# Patient Record
Sex: Male | Born: 1935 | Race: White | Hispanic: No | Marital: Married | Smoking: Former smoker
Health system: Southern US, Community
[De-identification: ages and names within clinical notes are randomized; demographics above are authoritative.]

## PROBLEM LIST (undated history)

## (undated) DIAGNOSIS — J342 Deviated nasal septum: Secondary | ICD-10-CM

## (undated) DIAGNOSIS — H269 Unspecified cataract: Secondary | ICD-10-CM

## (undated) DIAGNOSIS — D509 Iron deficiency anemia, unspecified: Secondary | ICD-10-CM

## (undated) DIAGNOSIS — K760 Fatty (change of) liver, not elsewhere classified: Secondary | ICD-10-CM

## (undated) DIAGNOSIS — E039 Hypothyroidism, unspecified: Secondary | ICD-10-CM

## (undated) DIAGNOSIS — Z9989 Dependence on other enabling machines and devices: Secondary | ICD-10-CM

## (undated) DIAGNOSIS — K603 Anal fistula, unspecified: Secondary | ICD-10-CM

## (undated) DIAGNOSIS — R21 Rash and other nonspecific skin eruption: Secondary | ICD-10-CM

## (undated) DIAGNOSIS — K5520 Angiodysplasia of colon without hemorrhage: Secondary | ICD-10-CM

## (undated) DIAGNOSIS — A498 Other bacterial infections of unspecified site: Secondary | ICD-10-CM

## (undated) DIAGNOSIS — D369 Benign neoplasm, unspecified site: Secondary | ICD-10-CM

## (undated) DIAGNOSIS — K5730 Diverticulosis of large intestine without perforation or abscess without bleeding: Secondary | ICD-10-CM

## (undated) DIAGNOSIS — Z860100 Personal history of colon polyps, unspecified: Secondary | ICD-10-CM

## (undated) DIAGNOSIS — G4733 Obstructive sleep apnea (adult) (pediatric): Secondary | ICD-10-CM

## (undated) DIAGNOSIS — I4891 Unspecified atrial fibrillation: Secondary | ICD-10-CM

## (undated) DIAGNOSIS — K219 Gastro-esophageal reflux disease without esophagitis: Secondary | ICD-10-CM

## (undated) DIAGNOSIS — Q2730 Arteriovenous malformation, site unspecified: Secondary | ICD-10-CM

## (undated) DIAGNOSIS — K2270 Barrett's esophagus without dysplasia: Secondary | ICD-10-CM

## (undated) DIAGNOSIS — N400 Enlarged prostate without lower urinary tract symptoms: Secondary | ICD-10-CM

## (undated) DIAGNOSIS — J302 Other seasonal allergic rhinitis: Secondary | ICD-10-CM

## (undated) DIAGNOSIS — R040 Epistaxis: Secondary | ICD-10-CM

## (undated) DIAGNOSIS — M1990 Unspecified osteoarthritis, unspecified site: Secondary | ICD-10-CM

## (undated) DIAGNOSIS — N281 Cyst of kidney, acquired: Secondary | ICD-10-CM

## (undated) HISTORY — PX: EXPLORATORY LAPAROTOMY: SUR591

## (undated) HISTORY — DX: Angiodysplasia of colon without hemorrhage: K55.20

## (undated) HISTORY — DX: Hypothyroidism, unspecified: E03.9

## (undated) HISTORY — DX: Iron deficiency anemia, unspecified: D50.9

## (undated) HISTORY — PX: ESOPHAGOGASTRODUODENOSCOPY: SHX1529

## (undated) HISTORY — DX: Barrett's esophagus without dysplasia: K22.70

## (undated) HISTORY — DX: Unspecified atrial fibrillation: I48.91

## (undated) HISTORY — DX: Benign neoplasm, unspecified site: D36.9

## (undated) HISTORY — PX: CYSTOSCOPY: SUR368

## (undated) HISTORY — PX: COLONOSCOPY: SHX174

## (undated) HISTORY — DX: Benign prostatic hyperplasia without lower urinary tract symptoms: N40.0

---

## 2000-04-08 ENCOUNTER — Ambulatory Visit (HOSPITAL_COMMUNITY): Admission: RE | Admit: 2000-04-08 | Discharge: 2000-04-08 | Payer: Self-pay | Admitting: *Deleted

## 2000-04-08 ENCOUNTER — Encounter: Payer: Self-pay | Admitting: Internal Medicine

## 2000-04-08 ENCOUNTER — Encounter (INDEPENDENT_AMBULATORY_CARE_PROVIDER_SITE_OTHER): Payer: Self-pay | Admitting: Specialist

## 2001-04-01 ENCOUNTER — Encounter: Payer: Self-pay | Admitting: Urology

## 2001-04-01 ENCOUNTER — Encounter: Admission: RE | Admit: 2001-04-01 | Discharge: 2001-04-01 | Payer: Self-pay | Admitting: Urology

## 2006-04-23 ENCOUNTER — Encounter: Admission: RE | Admit: 2006-04-23 | Discharge: 2006-04-23 | Payer: Self-pay | Admitting: Internal Medicine

## 2006-11-24 ENCOUNTER — Encounter: Payer: Self-pay | Admitting: Internal Medicine

## 2006-12-17 ENCOUNTER — Encounter: Payer: Self-pay | Admitting: Internal Medicine

## 2006-12-17 ENCOUNTER — Encounter (INDEPENDENT_AMBULATORY_CARE_PROVIDER_SITE_OTHER): Payer: Self-pay | Admitting: *Deleted

## 2006-12-17 ENCOUNTER — Ambulatory Visit (HOSPITAL_COMMUNITY): Admission: RE | Admit: 2006-12-17 | Discharge: 2006-12-17 | Payer: Self-pay | Admitting: *Deleted

## 2007-02-01 ENCOUNTER — Encounter: Payer: Self-pay | Admitting: Internal Medicine

## 2008-05-31 ENCOUNTER — Encounter: Admission: RE | Admit: 2008-05-31 | Discharge: 2008-05-31 | Payer: Self-pay | Admitting: Internal Medicine

## 2008-06-27 ENCOUNTER — Encounter: Payer: Self-pay | Admitting: Internal Medicine

## 2008-06-27 ENCOUNTER — Ambulatory Visit (HOSPITAL_COMMUNITY): Admission: RE | Admit: 2008-06-27 | Discharge: 2008-06-27 | Payer: Self-pay | Admitting: *Deleted

## 2008-06-27 ENCOUNTER — Encounter (INDEPENDENT_AMBULATORY_CARE_PROVIDER_SITE_OTHER): Payer: Self-pay | Admitting: *Deleted

## 2008-08-04 ENCOUNTER — Encounter: Payer: Self-pay | Admitting: Internal Medicine

## 2009-02-01 ENCOUNTER — Encounter (INDEPENDENT_AMBULATORY_CARE_PROVIDER_SITE_OTHER): Payer: Self-pay | Admitting: *Deleted

## 2009-02-15 ENCOUNTER — Encounter (INDEPENDENT_AMBULATORY_CARE_PROVIDER_SITE_OTHER): Payer: Self-pay | Admitting: *Deleted

## 2009-02-20 ENCOUNTER — Ambulatory Visit: Payer: Self-pay | Admitting: Internal Medicine

## 2009-03-05 ENCOUNTER — Ambulatory Visit: Payer: Self-pay | Admitting: Internal Medicine

## 2009-04-23 ENCOUNTER — Ambulatory Visit: Payer: Self-pay | Admitting: Internal Medicine

## 2009-04-23 DIAGNOSIS — Z86010 Personal history of colonic polyps: Secondary | ICD-10-CM | POA: Insufficient documentation

## 2009-04-23 DIAGNOSIS — K2270 Barrett's esophagus without dysplasia: Secondary | ICD-10-CM | POA: Insufficient documentation

## 2010-01-08 ENCOUNTER — Encounter
Admission: RE | Admit: 2010-01-08 | Discharge: 2010-01-08 | Payer: Self-pay | Source: Home / Self Care | Attending: Internal Medicine | Admitting: Internal Medicine

## 2010-02-26 NOTE — Op Note (Signed)
Summary: EGD:   NAME:  SQUIRE, WITHEY NO.:  [id number]      MEDICAL RECORD NO.:  [id number]          PATIENT TYPE:  AMB      LOCATION:  ENDO                         FACILITY:  [facility]      PHYSICIAN:  Georgiana Spinner, M.D.    DATE OF BIRTH:  1931/05/12      DATE OF PROCEDURE:  12/17/2006   DATE OF DISCHARGE:                                  OPERATIVE REPORT      PROCEDURE:  Upper endoscopy.      INDICATIONS:  GERD.      ANESTHESIA:  Fentanyl 50 mcg, Versed 4 mg.      PROCEDURE:  With the patient mildly sedated in the left lateral   decubitus position, the Pentax videoscopic endoscope was inserted in the   mouth, passed under direct vision through the esophagus, which appeared   normal on first view.  We then entered into the stomach.  Fundus, body,   antrum appeared normal.  Duodenal bulb showed changes that were   photographed and biopsied.  Second portion of the duodenum appeared   normal.  From this point the endoscope was slowly withdrawn taking   circumferential views of duodenal mucosa until the endoscope had been   pulled back into the stomach, placed in retroflexion to view the stomach   from below.  The endoscope was straightened and withdrawn taking   circumferential views of the remaining gastric and esophageal mucosa,   stopping in the lower esophagus where an area of Barrett's was   photographed and biopsied.  The endoscope was withdrawn.  The patient's   vital signs and pulse oximetry remained stable.  The patient tolerated   the procedure well without apparent complication.      FINDINGS:  Question of Barrett's esophagus, clearly seen remote from the   squamocolumnar junction.      Await biopsy report.  The patient will call me for results and follow up   with me as an outpatient.  Proceed to colonoscopy.                  ______________________________   Georgiana Spinner, M.D.            GMO/MEDQ  D:  12/17/2006  T:  12/17/2006  Job:  [id number]

## 2010-02-26 NOTE — Op Note (Signed)
Summary: EGD: Dr. Virginia Rochester  NAME:  Carl Woods, Carl Woods NO.:  [id number]      MEDICAL RECORD NO.:  [id number]          PATIENT TYPE:  AMB      LOCATION:  ENDO                         FACILITY:  [facility]      PHYSICIAN:  Georgiana Spinner, M.D.    DATE OF BIRTH:  24-May-1931      DATE OF PROCEDURE:  06/27/2008   DATE OF DISCHARGE:                                  OPERATIVE REPORT      PROCEDURE:  Upper endoscopy with biopsy.      INDICATIONS:  GERD with Barrett's esophagus.      ANESTHESIA:  Fentanyl 50 mcg, Versed 4 mg.      PROCEDURE:  With the patient mildly sedated in the left lateral   decubitus position, the Pentax videoscopic endoscope was inserted in the   mouth and passed under direct vision through the esophagus which   appeared normal until we reached the distal esophagus, and there was an   [place] of Barrett's photographed and biopsied.  We entered into the   stomach.  Fundus, body, antrum, duodenal bulb, and second portion of the   duodenum were visualized.  From this point, the endoscope was slowly   withdrawn, taking circumferential views of duodenal mucosa, stopping in   the bulb where some areas of raised mucosa were noted, photographed and   biopsied.  The endoscope was pulled back into stomach and placed in   retroflexion to view the stomach from below.  The endoscope was   straightened and withdrawn, taking circumferential views of the   remaining gastric and esophageal mucosa.  The patient's vital signs and   pulse oximeter remained stable.  The patient tolerated the procedure   well without apparent complications.      FINDINGS:  Changes in the duodenum biopsied and question of island of   Barrett's esophagus biopsied.  Await biopsy report.  The patient will   call me for results and follow-up with me as an outpatient.                  ______________________________   Georgiana Spinner, M.D.            GMO/MEDQ  D:  06/27/2008  T:  06/27/2008  Job:   [id number]

## 2010-02-26 NOTE — Letter (Signed)
Summary: [facility] Instructions  [facility]  [address] [place], [place] [address]   Phone: [phone]  Fax: [phone]       Carl Woods    04/28/1931    MRN: [id number]        Procedure Day /Date: 03/05/09  Monday     Arrival Time:  8:30am     Procedure Time: 9:30am     Location of Procedure:                    _ x_  [facility] (4th Floor)                        PREPARATION FOR COLONOSCOPY WITH MOVIPREP   Starting 5 days prior to your procedure _2/2/11 _ do not eat nuts, seeds, popcorn, corn, beans, peas,  salads, or any raw vegetables.  Do not take any fiber supplements (e.g. Metamucil, Citrucel, and Benefiber).  THE DAY BEFORE YOUR PROCEDURE         DATE:  03/04/09  DAY: Sunday  1.  Drink clear liquids the entire day-NO SOLID FOOD  2.  Do not drink anything colored red or purple.  Avoid juices with pulp.  No orange juice.  3.  Drink at least 64 oz. (8 glasses) of fluid/clear liquids during the day to prevent dehydration and help the prep work efficiently.  CLEAR LIQUIDS INCLUDE: Water Jello Ice Popsicles Tea (sugar ok, no milk/cream) Powdered fruit flavored drinks Coffee (sugar ok, no milk/cream) Gatorade Juice: apple, white grape, white cranberry  Lemonade Clear bullion, consomm, broth Carbonated beverages (any kind) Strained chicken noodle soup Hard Candy                             4.  In the morning, mix first dose of MoviPrep solution:    Empty 1 Pouch A and 1 Pouch B into the disposable container    Add lukewarm drinking water to the top line of the container. Mix to dissolve    Refrigerate (mixed solution should be used within 24 hrs)  5.  Begin drinking the prep at 5:00 p.m. The MoviPrep container is divided by 4 marks.   Every 15 minutes drink the solution down to the next mark (approximately 8 oz) until the full liter is complete.   6.  Follow completed prep with 16 oz of clear liquid of your choice (Nothing red  or purple).  Continue to drink clear liquids until bedtime.  7.  Before going to bed, mix second dose of MoviPrep solution:    Empty 1 Pouch A and 1 Pouch B into the disposable container    Add lukewarm drinking water to the top line of the container. Mix to dissolve    Refrigerate  THE DAY OF YOUR PROCEDURE      DATE:  03/05/09  DAY: Monday  Beginning at 4:30 a.m. (5 hours before procedure):         1. Every 15 minutes, drink the solution down to the next mark (approx 8 oz) until the full liter is complete.  2. Follow completed prep with 16 oz. of clear liquid of your choice.    3. You may drink clear liquids until  7:30am  (2 HOURS BEFORE PROCEDURE).   MEDICATION INSTRUCTIONS  Unless otherwise instructed, you should take regular prescription medications with a small sip of water   as early  as possible the morning of your procedure.         OTHER INSTRUCTIONS  You will need a responsible adult at least 75 years of age to accompany you and drive you home.   This person must remain in the waiting room during your procedure.  Wear loose fitting clothing that is easily removed.  Leave jewelry and other valuables at home.  However, you may wish to bring a book to read or  an iPod/MP3 player to listen to music as you wait for your procedure to start.  Remove all body piercing jewelry and leave at home.  Total time from sign-in until discharge is approximately 2-3 hours.  You should go home directly after your procedure and rest.  You can resume normal activities the  day after your procedure.  The day of your procedure you should not:   Drive   Make legal decisions   Operate machinery   Drink alcohol   Return to work  You will receive specific instructions about eating, activities and medications before you leave.    The above instructions have been reviewed and explained to me by   Wyona Almas RN  February 20, 2009 9:37 AM     I fully understand and  can verbalize these instructions _____________________________ Date _________

## 2010-02-26 NOTE — Procedures (Signed)
Summary: Colonoscopy  Patient: Carl Woods Note: All result statuses are Final unless otherwise noted.  Tests: (1) Colonoscopy (COL)   COL Colonoscopy           DONE     [facility]     [address]     [place], [place]  [address]           COLONOSCOPY PROCEDURE REPORT           PATIENT:  Carl Woods  MR#:  [id number]     BIRTHDATE:  October 20, 1931, 77 yrs. old  GENDER:  male           ENDOSCOPIST:  [name], MD, [facility]     Referred by:  Juline Patch, M.D.           PROCEDURE DATE:  03/05/2009     PROCEDURE:  Colonoscopy 19147     ASA CLASS:  Class II     INDICATIONS:  history of pre-cancerous (adenomatous) colon polyps     prior adenomas (2) ascending colon (size not specified, 0.3 and     0.7 cm in pathology report) 11/08 (Dr. Virginia Rochester), no adenomas 2002     colonoscopy           MEDICATIONS:   Fentanyl 50 mcg IV, Versed 4 mg IV           DESCRIPTION OF PROCEDURE:   After the risks benefits and     alternatives of the procedure were thoroughly explained, informed     consent was obtained.  Digital rectal exam was performed and     revealed no abnormalities and normal prostate.   The LB PCF-H180AL     [id number] endoscope was introduced through the anus and advanced to     the cecum, which was identified by both the appendix and ileocecal     valve, without limitations.  The quality of the prep was     excellent, using MoviPrep.  The instrument was then slowly     withdrawn as the colon was fully examined.     Insertion: 2:53 minutes Withdrawal: 11:07 minutes     <<PROCEDUREIMAGES>>           FINDINGS:  Moderate diverticulosis was found in the sigmoid colon.     This was otherwise a normal examination of the colon.     Retroflexed views in the rectum revealed no abnormalities.    The     scope was then withdrawn from the patient and the procedure     completed.           COMPLICATIONS:  None           ENDOSCOPIC IMPRESSION:     1) Moderate diverticulosis in the  sigmoid colon     2) Otherwise normal examination, excellent prep     3) Prior adenomas (removed) 11/08     RECOMMENDATIONS:     This is my first encounter with Mr. [name]. He also has a history     of GERD and Barrett's esophagus. Since his prior     gastroenterologist has moved, I will ask him to arrange an office     appointment to review his GERd and Barrett's and future endoscopy     plans.     REPEAT EXAM:  In 5 year(s) for routine colonoscopy for polyp     surveillance.  if medically fit (he would be 82)  [name], MD, [facility]           CC:  Juline Patch, MD     The Patient           n.     eSIGNED:   [name] at 03/05/2009 10:24 AM           Charlesetta Ivory, [id number]  Note: An exclamation mark (!) indicates a result that was not dispersed into the flowsheet. Document Creation Date: 03/05/2009 10:24 AM _______________________________________________________________________  (1) Order result status: Final Collection or observation date-time: 03/05/2009 10:12 Requested date-time:  Receipt date-time:  Reported date-time:  Referring Physician:   Ordering Physician: Stan Head [id number]) Specimen Source:  Source: Launa Grill Order Number: [id number] Lab site:   Appended Document: Colonoscopy    Clinical Lists Changes  Observations: Added new observation of COLONNXTDUE: 02/2014 (03/05/2009 10:32)

## 2010-02-26 NOTE — Op Note (Signed)
Summary: Colonoscopy: Dr. Debbra Riding [facility]  Patient:    Carl Woods, Carl Woods                         MRN: [id number] Proc. Date: 04/08/00 Adm. Date:  [id number] Attending:  Sabino Gasser                           Procedure Report  PROCEDURE:  Colonoscopy.  ENDOSCOPIST:  Sabino Gasser, M.D.  ANESTHESIA:  Demerol 40 mg, Versed 5 mg.  DESCRIPTION OF PROCEDURE:  With the patient mildly sedated in the left lateral decubitus position, the Olympus videoscopic colonoscope was inserted colonoscope was inserted into the rectum after a normal rectal exam and passed under direct vision to the cecum identified by ileocecal valve and appendiceal orifice.  I was not sure whether there was some adenomatous tissue in this orifice so I biopsied this area.  Subsequently, the endoscope was slowly withdrawn taking circumferential views of the entire colonic mucosa stopping only in the rectum which appeared normal on direct view and in retroflexed view as well.  The endoscope was straightened and withdrawn.  The patients vital signs and pulse oximeter remained stable.  The patient tolerated the procedure well without apparent complications.  FINDINGS:  Question of polyp in ileocecum, biopsied.  Otherwise unremarkable examination.  PLAN:  I will have the patient call me for results of biopsy and follow up with me as needed as an outpatient. DD:  04/08/00 TD:  04/08/00 Job: [id number]

## 2010-02-26 NOTE — Letter (Signed)
Summary: [facility]   Imported By: Lester Sedona 03/14/2009 09:49:34  _____________________________________________________________________  External Attachment:    Type:   Image     Comment:   External Document

## 2010-02-26 NOTE — Letter (Signed)
Summary: Previsit letter  [facility]  [address] [place], [place] [address]   Phone: [phone]  Fax: [phone]       02/01/2009 MRN: [id number]  Carl Woods [address] [place], [place]  [address]  Dear [name],  Welcome to the Gastroenterology Division at [facility].    You are scheduled to see a nurse for your pre-procedure visit on 02-20-09 at 9:00a.m. on the 3rd floor at [facility], [address]  We ask that you try to arrive at our office 15 minutes prior to your appointment time to allow for check-in.  Your nurse visit will consist of discussing your medical and surgical history, your immediate family medical history, and your medications.    Please bring a complete list of all your medications or, if you prefer, bring the medication bottles and we will list them.  We will need to be aware of both prescribed and over the counter drugs.  We will need to know exact dosage information as well.  If you are on blood thinners (Coumadin, Plavix, Aggrenox, Ticlid, etc.) please call our office today/prior to your appointment, as we need to consult with your physician about holding your medication.   Please be prepared to read and sign documents such as consent forms, a financial agreement, and acknowledgement forms.  If necessary, and with your consent, a friend or relative is welcome to sit-in on the nurse visit with you.  Please bring your insurance card so that we may make a copy of it.  If your insurance requires a referral to see a specialist, please bring your referral form from your primary care physician.  No co-pay is required for this nurse visit.     If you cannot keep your appointment, please call [phone] to cancel or reschedule prior to your appointment date.  This allows [place] the opportunity to schedule an appointment for another patient in need of care.    Thank you for choosing  Gastroenterology for your medical needs.  We  appreciate the opportunity to care for you.  Please visit [place] at our website  to learn more about our practice.                     Sincerely.                                                                                                                   The Gastroenterology Division

## 2010-02-26 NOTE — Op Note (Signed)
Summary: Colonoscopy: Dr. Virginia Rochester  NAME:  Carl Woods, Carl Woods NO.:  [id number]      MEDICAL RECORD NO.:  [id number]          PATIENT TYPE:  AMB      LOCATION:  ENDO                         FACILITY:  [facility]      PHYSICIAN:  Georgiana Spinner, M.D.    DATE OF BIRTH:  02-Oct-1931      DATE OF PROCEDURE:  12/17/2006   DATE OF DISCHARGE:                                  OPERATIVE REPORT      PROCEDURE:  Colonoscopy.      INDICATIONS:  Colon polyps, colon cancer screening.      ANESTHESIA:  Versed 1 mg.      PROCEDURE:  With the patient mildly sedated in the left lateral   decubitus position, a rectal exam was attempted and was unremarkable to   my examination.  Subsequently the Pentax videoscopic colonoscope was   inserted into the rectum and passed under direct vision to the cecum,   identified by ileocecal valve and appendiceal orifice.  At this point   the colonoscope was slowly withdrawn taking circumferential views of   colonic mucosa stopping in the ascending colon, where two polyps were   seen and removed, one using snare cautery technique, the other with hot   biopsy forceps technique, both with a setting of 20/150 blended current.   The endoscope was then withdrawn all the way to the rectum stopping next   in the descending colon, where another polyp was seen.  It too was   photographed and it too was removed using hot biopsy forceps technique   with snare cautery technique with the same setting.  All tissue was   retrieved for pathology.  In the rectum the endoscope was placed in   retroflexion to view the anal canal from above.  The endoscope was   straightened and withdrawn.  The patient's vital signs and pulse   oximetry remained stable.  The patient tolerated the procedure well   without apparent complications.      FINDINGS:  Two polyps of the ascending colon, a polyp of descending   colon, all removed.      Await biopsy report.  The patient will call me for  results and follow up   with me as an outpatient.                  ______________________________   Georgiana Spinner, M.D.            GMO/MEDQ  D:  12/17/2006  T:  12/17/2006  Job:  [id number]

## 2010-02-26 NOTE — Letter (Signed)
Summary: [facility]   Imported By: Lester Haynes 03/14/2009 09:51:21  _____________________________________________________________________  External Attachment:    Type:   Image     Comment:   External Document

## 2010-02-26 NOTE — Miscellaneous (Signed)
Summary: [facility] Previsit/prep  Clinical Lists Changes  Medications: Added new medication of MOVIPREP 100 GM  SOLR (PEG-KCL-NACL-NASULF-NA ASC-C) As per prep instructions. - Signed Rx of MOVIPREP 100 GM  SOLR (PEG-KCL-NACL-NASULF-NA ASC-C) As per prep instructions.;  #1 x 0;  Signed;  Entered by: Wyona Almas RN;  Authorized by: [name] MD, [facility];  Method used: Electronically to [facility] [address] [place], [place], [place]  [address], Ph: [phone], Fax: [phone] Observations: Added new observation of NKA: T (02/20/2009 8:41)    Prescriptions: MOVIPREP 100 GM  SOLR (PEG-KCL-NACL-NASULF-NA ASC-C) As per prep instructions.  #1 x 0   Entered by:   Wyona Almas RN   Authorized by:   [name] MD, [facility]   Signed by:   Wyona Almas RN on 02/20/2009   Method used:   Electronically to        [facility] (retail)       [address]       [place], [place]  [address]       Ph: [phone]       Fax: [phone]   RxID:   [id number]

## 2010-02-26 NOTE — Assessment & Plan Note (Signed)
Summary: f/u from colon--ch.   History of Present Illness Visit Type: Initial Visit Primary GI MD: Stan Head MD [facility] Primary Provider: Juline Patch, MD Chief Complaint: F/U colon History of Present Illness:   77 yo white man with a history of Brrett's esophagus diagnosed and followed by Dr. Virginia Rochester in the past. I met him at a colonoscopy in the past month and he is here to review and discuss his Barrett's esophagus.  He is on omeprazole 20 mg daily. About every 6 months he may hnave some reflux/heartburn. Ususually if he overeats, like he did lat night. No dysphagia, unintentional weight loss or bleeding.         Preventive Screening-Counseling & Management  Alcohol-Tobacco     Smoking Status: quit      Drug Use:  no.      EGD  Procedure date:  06/27/2008  Findings:      ? of Barrett's island = INFLAMMATION, NO METAPLASIA raised duodenal mucosa = GASTRIC METAPLASIA  EGD  Procedure date:  12/17/2006  Findings:      Barrett's esophagus - columnar change in esophagus with intestinal metaplasia Duodenum with gastric metaplasia  Procedures Next Due Date:    EGD: 06/2011   Current Medications (verified): 1)  Vitamin D 50,000iu .... Once Monthly 2)  Omeprazole 20 Mg Cpdr (Omeprazole) .... Once Daily 3)  Fexofenadine Hcl 180 Mg Tabs (Fexofenadine Hcl) .... As Needed 4)  Glucosamine-Chondroitin 500-400 Mg Caps (Glucosamine-Chondroitin) .... Once Daily 5)  Levoxyl 112 Mcg Tabs (Levothyroxine Sodium) .... Once Daily 6)  Fluticasone Propionate 50 Mcg/act Susp (Fluticasone Propionate) .... As Needed 7)  Astelin 137 Mcg/spray Soln (Azelastine Hcl) .... As Needed 8)  Viagra 100 Mg Tabs (Sildenafil Citrate) .... As Needed 9)  Saline Nasal Spray 0.65 % Soln (Saline) .... As Needed 10)  Androgel Pump 1 % Gel (Testosterone) .... As Needed  Allergies (verified): No Known Drug Allergies  Past History:  Past Medical History: Barretts Esophagus/GERD Hypothyroidism Urinary  Tract Infection Colon adenomas (last in 2008)  Past Surgical History: Bladder Repair  Family History: No FH of Colon Cancer: Family History of Heart Disease: Both Parents  Social History: Occupation: Retired Patient is a former smoker.  Alcohol Use - no Daily Caffeine Use Illicit Drug Use - no Smoking Status:  quit Drug Use:  no  Review of Systems       The patient complains of allergy/sinus and arthritis/joint pain.    Vital Signs:  Patient profile:   77 year old male Height:      69 inches Weight:      229.50 pounds BMI:     34.01 Pulse rate:   68 / minute Pulse rhythm:   regular BP sitting:   120 / 68  (left arm) Cuff size:   regular  Vitals Entered By: June McMurray CMA Duncan Dull) (April 23, 2009 11:03 AM)  Physical Exam  General:  obese, NAD   Impression & Recommendations:  Problem # 1:  BARRETTS ESOPHAGUS (ICD-530.85) Assessment New EGD, pathology reports and photos from 2008 and 2010 reviewd. Small island-like area of columnar change above z-line in esophagus, in 2008 there was intestinal metaplasia seen but not in 2010.  I explained timing of administration of PPI (30-60 mins before a meal). We reviewed nature and risks of Barrett's esophagus and a handout was provided. If he has increasing GERD symptoms he should see me sooner than routine EGD in 2012 Otherwise he should follow-up with PCP for PPI, etc until  then.  Patient Instructions: 1)  Take the Prilosec (omeprazole) 20 mg every day 30-60 minutes before  2)  If you develop frequent heartburn (more than a couple of times a month) inform Dr. Leone Payor 3)  Your next routine upper endoscopy to check on the Barrett's esophagus should be in June 2013. 4)  Copy sent to : Juline Patch, MD 5)  The medication list was reviewed and reconciled.  All changed / newly prescribed medications were explained.  A complete medication list was provided to the patient / caregiver.

## 2010-02-26 NOTE — Letter (Signed)
Summary: [facility]   Imported By: Lester Yettem 03/14/2009 09:44:39  _____________________________________________________________________  External Attachment:    Type:   Image     Comment:   External Document

## 2010-06-11 NOTE — Op Note (Signed)
NAME:  Carl Woods, Carl Woods NO.:  [id number]   MEDICAL RECORD NO.:  [id number]          PATIENT TYPE:  AMB   LOCATION:  ENDO                         FACILITY:  [facility]   PHYSICIAN:  Georgiana Spinner, M.D.    DATE OF BIRTH:  [birth year]   DATE OF PROCEDURE:  12/17/2006  DATE OF DISCHARGE:                               OPERATIVE REPORT   PROCEDURE:  Colonoscopy.   INDICATIONS:  Colon polyps, colon cancer screening.   ANESTHESIA:  Versed 1 mg.   PROCEDURE:  With the patient mildly sedated in the left lateral  decubitus position, a rectal exam was attempted and was unremarkable to  my examination.  Subsequently the Pentax videoscopic colonoscope was  inserted into the rectum and passed under direct vision to the cecum,  identified by ileocecal valve and appendiceal orifice.  At this point  the colonoscope was slowly withdrawn taking circumferential views of  colonic mucosa stopping in the ascending colon, where two polyps were  seen and removed, one using snare cautery technique, the other with hot  biopsy forceps technique, both with a setting of 20/150 blended current.  The endoscope was then withdrawn all the way to the rectum stopping next  in the descending colon, where another polyp was seen.  It too was  photographed and it too was removed using hot biopsy forceps technique  with snare cautery technique with the same setting.  All tissue was  retrieved for pathology.  In the rectum the endoscope was placed in  retroflexion to view the anal canal from above.  The endoscope was  straightened and withdrawn.  The patient's vital signs and pulse  oximetry remained stable.  The patient tolerated the procedure well  without apparent complications.   FINDINGS:  Two polyps of the ascending colon, a polyp of descending  colon, all removed.   Await biopsy report.  The patient will call me for results and follow up  with me as an outpatient.     ______________________________  Georgiana Spinner, M.D.     GMO/MEDQ  D:  12/17/2006  T:  12/17/2006  Job:  [id number]

## 2010-06-11 NOTE — Op Note (Signed)
NAME:  Carl Woods, Carl Woods NO.:  [id number]   MEDICAL RECORD NO.:  [id number]          PATIENT TYPE:  AMB   LOCATION:  ENDO                         FACILITY:  [facility]   PHYSICIAN:  Georgiana Spinner, M.D.    DATE OF BIRTH:  [birth year]   DATE OF PROCEDURE:  12/17/2006  DATE OF DISCHARGE:                               OPERATIVE REPORT   PROCEDURE:  Upper endoscopy.   INDICATIONS:  GERD.   ANESTHESIA:  Fentanyl 50 mcg, Versed 4 mg.   PROCEDURE:  With the patient mildly sedated in the left lateral  decubitus position, the Pentax videoscopic endoscope was inserted in the  mouth, passed under direct vision through the esophagus, which appeared  normal on first view.  We then entered into the stomach.  Fundus, body,  antrum appeared normal.  Duodenal bulb showed changes that were  photographed and biopsied.  Second portion of the duodenum appeared  normal.  From this point the endoscope was slowly withdrawn taking  circumferential views of duodenal mucosa until the endoscope had been  pulled back into the stomach, placed in retroflexion to view the stomach  from below.  The endoscope was straightened and withdrawn taking  circumferential views of the remaining gastric and esophageal mucosa,  stopping in the lower esophagus where an area of Barrett's was  photographed and biopsied.  The endoscope was withdrawn.  The patient's  vital signs and pulse oximetry remained stable.  The patient tolerated  the procedure well without apparent complication.   FINDINGS:  Question of Barrett's esophagus, clearly seen remote from the  squamocolumnar junction.   Await biopsy report.  The patient will call me for results and follow up  with me as an outpatient.  Proceed to colonoscopy.           ______________________________  Georgiana Spinner, M.D.     GMO/MEDQ  D:  12/17/2006  T:  12/17/2006  Job:  [id number]

## 2010-06-11 NOTE — Op Note (Signed)
NAME:  Carl Woods, Carl Woods NO.:  [id number]   MEDICAL RECORD NO.:  [id number]          PATIENT TYPE:  AMB   LOCATION:  ENDO                         FACILITY:  [facility]   PHYSICIAN:  Georgiana Spinner, M.D.    DATE OF BIRTH:  Jul 16, 1931   DATE OF PROCEDURE:  06/27/2008  DATE OF DISCHARGE:                               OPERATIVE REPORT   PROCEDURE:  Upper endoscopy with biopsy.   INDICATIONS:  GERD with Barrett's esophagus.   ANESTHESIA:  Fentanyl 50 mcg, Versed 4 mg.   PROCEDURE:  With the patient mildly sedated in the left lateral  decubitus position, the Pentax videoscopic endoscope was inserted in the  mouth and passed under direct vision through the esophagus which  appeared normal until we reached the distal esophagus, and there was an  [place] of Barrett's photographed and biopsied.  We entered into the  stomach.  Fundus, body, antrum, duodenal bulb, and second portion of the  duodenum were visualized.  From this point, the endoscope was slowly  withdrawn, taking circumferential views of duodenal mucosa, stopping in  the bulb where some areas of raised mucosa were noted, photographed and  biopsied.  The endoscope was pulled back into stomach and placed in  retroflexion to view the stomach from below.  The endoscope was  straightened and withdrawn, taking circumferential views of the  remaining gastric and esophageal mucosa.  The patient's vital signs and  pulse oximeter remained stable.  The patient tolerated the procedure  well without apparent complications.   FINDINGS:  Changes in the duodenum biopsied and question of island of  Barrett's esophagus biopsied.  Await biopsy report.  The patient will  call me for results and follow-up with me as an outpatient.           ______________________________  Georgiana Spinner, M.D.     GMO/MEDQ  D:  06/27/2008  T:  06/27/2008  Job:  [id number]

## 2010-06-14 NOTE — Procedures (Signed)
[facility]  Patient:    Carl Woods, Carl Woods                         MRN: [id number] Proc. Date: 04/08/00 Adm. Date:  [id number] Attending:  Sabino Gasser                           Procedure Report  PROCEDURE:  Colonoscopy.  ENDOSCOPIST:  Sabino Gasser, M.D.  ANESTHESIA:  Demerol 40 mg, Versed 5 mg.  DESCRIPTION OF PROCEDURE:  With the patient mildly sedated in the left lateral decubitus position, the Olympus videoscopic colonoscope was inserted colonoscope was inserted into the rectum after a normal rectal exam and passed under direct vision to the cecum identified by ileocecal valve and appendiceal orifice.  I was not sure whether there was some adenomatous tissue in this orifice so I biopsied this area.  Subsequently, the endoscope was slowly withdrawn taking circumferential views of the entire colonic mucosa stopping only in the rectum which appeared normal on direct view and in retroflexed view as well.  The endoscope was straightened and withdrawn.  The patients vital signs and pulse oximeter remained stable.  The patient tolerated the procedure well without apparent complications.  FINDINGS:  Question of polyp in ileocecum, biopsied.  Otherwise unremarkable examination.  PLAN:  I will have the patient call me for results of biopsy and follow up with me as needed as an outpatient. DD:  04/08/00 TD:  04/08/00 Job: [id number]

## 2011-05-26 ENCOUNTER — Encounter: Payer: Self-pay | Admitting: Internal Medicine

## 2012-12-29 ENCOUNTER — Other Ambulatory Visit: Payer: Self-pay | Admitting: Internal Medicine

## 2012-12-29 DIAGNOSIS — R799 Abnormal finding of blood chemistry, unspecified: Secondary | ICD-10-CM

## 2013-01-04 ENCOUNTER — Ambulatory Visit
Admission: RE | Admit: 2013-01-04 | Discharge: 2013-01-04 | Disposition: A | Discharge status: Home / Self Care | Payer: Medicare Other | Source: Ambulatory Visit | Attending: Internal Medicine | Admitting: Internal Medicine

## 2013-01-04 DIAGNOSIS — R799 Abnormal finding of blood chemistry, unspecified: Secondary | ICD-10-CM

## 2013-01-04 DIAGNOSIS — N281 Cyst of kidney, acquired: Secondary | ICD-10-CM

## 2013-01-04 HISTORY — DX: Cyst of kidney, acquired: N28.1

## 2013-09-28 ENCOUNTER — Encounter: Payer: Self-pay | Admitting: Internal Medicine

## 2014-03-06 ENCOUNTER — Encounter: Payer: Self-pay | Admitting: Internal Medicine

## 2015-02-26 DIAGNOSIS — Z0001 Encounter for general adult medical examination with abnormal findings: Secondary | ICD-10-CM | POA: Diagnosis not present

## 2015-02-26 DIAGNOSIS — I480 Paroxysmal atrial fibrillation: Secondary | ICD-10-CM | POA: Diagnosis not present

## 2015-02-26 DIAGNOSIS — G4733 Obstructive sleep apnea (adult) (pediatric): Secondary | ICD-10-CM | POA: Diagnosis not present

## 2015-02-26 DIAGNOSIS — K219 Gastro-esophageal reflux disease without esophagitis: Secondary | ICD-10-CM | POA: Diagnosis not present

## 2015-02-26 DIAGNOSIS — E039 Hypothyroidism, unspecified: Secondary | ICD-10-CM | POA: Diagnosis not present

## 2015-03-05 DIAGNOSIS — N183 Chronic kidney disease, stage 3 (moderate): Secondary | ICD-10-CM | POA: Diagnosis not present

## 2015-03-05 DIAGNOSIS — I480 Paroxysmal atrial fibrillation: Secondary | ICD-10-CM | POA: Diagnosis not present

## 2015-03-05 DIAGNOSIS — R030 Elevated blood-pressure reading, without diagnosis of hypertension: Secondary | ICD-10-CM | POA: Diagnosis not present

## 2015-03-05 DIAGNOSIS — E039 Hypothyroidism, unspecified: Secondary | ICD-10-CM | POA: Diagnosis not present

## 2015-05-23 DIAGNOSIS — H40012 Open angle with borderline findings, low risk, left eye: Secondary | ICD-10-CM | POA: Diagnosis not present

## 2015-05-23 DIAGNOSIS — H40011 Open angle with borderline findings, low risk, right eye: Secondary | ICD-10-CM | POA: Diagnosis not present

## 2015-09-17 DIAGNOSIS — N183 Chronic kidney disease, stage 3 (moderate): Secondary | ICD-10-CM | POA: Diagnosis not present

## 2015-09-17 DIAGNOSIS — E039 Hypothyroidism, unspecified: Secondary | ICD-10-CM | POA: Diagnosis not present

## 2015-09-17 DIAGNOSIS — R030 Elevated blood-pressure reading, without diagnosis of hypertension: Secondary | ICD-10-CM | POA: Diagnosis not present

## 2015-09-24 DIAGNOSIS — E039 Hypothyroidism, unspecified: Secondary | ICD-10-CM | POA: Diagnosis not present

## 2015-09-24 DIAGNOSIS — I10 Essential (primary) hypertension: Secondary | ICD-10-CM | POA: Diagnosis not present

## 2015-09-24 DIAGNOSIS — I480 Paroxysmal atrial fibrillation: Secondary | ICD-10-CM | POA: Diagnosis not present

## 2015-09-24 DIAGNOSIS — N183 Chronic kidney disease, stage 3 (moderate): Secondary | ICD-10-CM | POA: Diagnosis not present

## 2015-10-17 ENCOUNTER — Other Ambulatory Visit: Payer: Self-pay | Admitting: Physician Assistant

## 2015-10-17 DIAGNOSIS — L570 Actinic keratosis: Secondary | ICD-10-CM | POA: Diagnosis not present

## 2015-10-17 DIAGNOSIS — C4492 Squamous cell carcinoma of skin, unspecified: Secondary | ICD-10-CM

## 2015-10-17 DIAGNOSIS — D0439 Carcinoma in situ of skin of other parts of face: Secondary | ICD-10-CM | POA: Diagnosis not present

## 2015-10-17 DIAGNOSIS — C44311 Basal cell carcinoma of skin of nose: Secondary | ICD-10-CM | POA: Diagnosis not present

## 2015-10-17 DIAGNOSIS — C4491 Basal cell carcinoma of skin, unspecified: Secondary | ICD-10-CM

## 2015-10-17 HISTORY — DX: Squamous cell carcinoma of skin, unspecified: C44.92

## 2015-10-17 HISTORY — DX: Basal cell carcinoma of skin, unspecified: C44.91

## 2015-11-08 DIAGNOSIS — C44311 Basal cell carcinoma of skin of nose: Secondary | ICD-10-CM | POA: Diagnosis not present

## 2015-11-08 DIAGNOSIS — D0439 Carcinoma in situ of skin of other parts of face: Secondary | ICD-10-CM | POA: Diagnosis not present

## 2015-12-19 DIAGNOSIS — N183 Chronic kidney disease, stage 3 (moderate): Secondary | ICD-10-CM | POA: Diagnosis not present

## 2015-12-19 DIAGNOSIS — I10 Essential (primary) hypertension: Secondary | ICD-10-CM | POA: Diagnosis not present

## 2015-12-25 DIAGNOSIS — Z131 Encounter for screening for diabetes mellitus: Secondary | ICD-10-CM | POA: Diagnosis not present

## 2015-12-27 DIAGNOSIS — I10 Essential (primary) hypertension: Secondary | ICD-10-CM | POA: Diagnosis not present

## 2015-12-27 DIAGNOSIS — E039 Hypothyroidism, unspecified: Secondary | ICD-10-CM | POA: Diagnosis not present

## 2015-12-27 DIAGNOSIS — Z23 Encounter for immunization: Secondary | ICD-10-CM | POA: Diagnosis not present

## 2015-12-27 DIAGNOSIS — I480 Paroxysmal atrial fibrillation: Secondary | ICD-10-CM | POA: Diagnosis not present

## 2015-12-27 DIAGNOSIS — N183 Chronic kidney disease, stage 3 (moderate): Secondary | ICD-10-CM | POA: Diagnosis not present

## 2015-12-31 DIAGNOSIS — H2513 Age-related nuclear cataract, bilateral: Secondary | ICD-10-CM | POA: Diagnosis not present

## 2015-12-31 DIAGNOSIS — H40013 Open angle with borderline findings, low risk, bilateral: Secondary | ICD-10-CM | POA: Diagnosis not present

## 2015-12-31 DIAGNOSIS — H524 Presbyopia: Secondary | ICD-10-CM | POA: Diagnosis not present

## 2015-12-31 DIAGNOSIS — H40011 Open angle with borderline findings, low risk, right eye: Secondary | ICD-10-CM | POA: Diagnosis not present

## 2015-12-31 DIAGNOSIS — H40012 Open angle with borderline findings, low risk, left eye: Secondary | ICD-10-CM | POA: Diagnosis not present

## 2016-02-26 DIAGNOSIS — L570 Actinic keratosis: Secondary | ICD-10-CM | POA: Diagnosis not present

## 2016-02-26 DIAGNOSIS — Z85828 Personal history of other malignant neoplasm of skin: Secondary | ICD-10-CM | POA: Diagnosis not present

## 2016-02-26 DIAGNOSIS — D229 Melanocytic nevi, unspecified: Secondary | ICD-10-CM | POA: Diagnosis not present

## 2016-03-13 DIAGNOSIS — E039 Hypothyroidism, unspecified: Secondary | ICD-10-CM | POA: Diagnosis not present

## 2016-03-13 DIAGNOSIS — Z0000 Encounter for general adult medical examination without abnormal findings: Secondary | ICD-10-CM | POA: Diagnosis not present

## 2016-03-13 DIAGNOSIS — N390 Urinary tract infection, site not specified: Secondary | ICD-10-CM | POA: Diagnosis not present

## 2016-03-13 DIAGNOSIS — I480 Paroxysmal atrial fibrillation: Secondary | ICD-10-CM | POA: Diagnosis not present

## 2016-03-13 DIAGNOSIS — N183 Chronic kidney disease, stage 3 (moderate): Secondary | ICD-10-CM | POA: Diagnosis not present

## 2016-03-20 DIAGNOSIS — D509 Iron deficiency anemia, unspecified: Secondary | ICD-10-CM | POA: Diagnosis not present

## 2016-03-20 DIAGNOSIS — E039 Hypothyroidism, unspecified: Secondary | ICD-10-CM | POA: Diagnosis not present

## 2016-03-20 DIAGNOSIS — I480 Paroxysmal atrial fibrillation: Secondary | ICD-10-CM | POA: Diagnosis not present

## 2016-03-20 DIAGNOSIS — N183 Chronic kidney disease, stage 3 (moderate): Secondary | ICD-10-CM | POA: Diagnosis not present

## 2016-04-08 DIAGNOSIS — D509 Iron deficiency anemia, unspecified: Secondary | ICD-10-CM | POA: Diagnosis not present

## 2016-04-08 DIAGNOSIS — J329 Chronic sinusitis, unspecified: Secondary | ICD-10-CM | POA: Diagnosis not present

## 2016-04-15 DIAGNOSIS — D509 Iron deficiency anemia, unspecified: Secondary | ICD-10-CM | POA: Diagnosis not present

## 2016-04-17 ENCOUNTER — Encounter: Payer: Self-pay | Admitting: Internal Medicine

## 2016-04-17 DIAGNOSIS — K922 Gastrointestinal hemorrhage, unspecified: Secondary | ICD-10-CM | POA: Diagnosis not present

## 2016-04-23 ENCOUNTER — Telehealth: Payer: Self-pay | Admitting: *Deleted

## 2016-04-23 ENCOUNTER — Encounter: Payer: Self-pay | Admitting: Gastroenterology

## 2016-04-23 ENCOUNTER — Ambulatory Visit (INDEPENDENT_AMBULATORY_CARE_PROVIDER_SITE_OTHER): Payer: PPO | Admitting: Gastroenterology

## 2016-04-23 ENCOUNTER — Encounter (INDEPENDENT_AMBULATORY_CARE_PROVIDER_SITE_OTHER): Payer: Self-pay

## 2016-04-23 VITALS — BP 118/54 | HR 72 | Ht 69.0 in | Wt 250.2 lb

## 2016-04-23 DIAGNOSIS — R195 Other fecal abnormalities: Secondary | ICD-10-CM | POA: Diagnosis not present

## 2016-04-23 DIAGNOSIS — Z7901 Long term (current) use of anticoagulants: Secondary | ICD-10-CM | POA: Diagnosis not present

## 2016-04-23 DIAGNOSIS — D500 Iron deficiency anemia secondary to blood loss (chronic): Secondary | ICD-10-CM

## 2016-04-23 DIAGNOSIS — Z86010 Personal history of colonic polyps: Secondary | ICD-10-CM | POA: Diagnosis not present

## 2016-04-23 NOTE — Patient Instructions (Signed)
You have been scheduled for an endoscopy and colonoscopy. Please follow the written instructions given to you at your visit today. Please pick up your prep supplies at the pharmacy within the next 1-3 days. If you use inhalers (even only as needed), please bring them with you on the day of your procedure. Your physician has requested that you go to www.startemmi.com and enter the access code given to you at your visit today. This web site gives a general overview about your procedure. However, you should still follow specific instructions given to you by our office regarding your preparation for the procedure.  You will be contacted by our office prior to your procedure for directions on holding your Eliquis.  If you do not hear from our office 1 week prior to your scheduled procedure, please call [phone] to discuss.   

## 2016-04-23 NOTE — Telephone Encounter (Signed)
  04/23/2016   RE: Carl Woods DOB: May 21, 1931 MRN: [id number]   Dear Dr Merrilee Seashore,   We have scheduled the above patient for an endoscopic procedure. Our records show that he is on anticoagulation therapy.   Please advise as to how long the patient may come off his therapy of  Eliquis prior to the procedure, which is scheduled for 05/28/2016.  Please fax back/ or route the completed form to [place] at [phone].   Sincerely,    Tonita Phoenix AAMA

## 2016-04-23 NOTE — Progress Notes (Addendum)
04/23/2016 Carl Woods [id number] [birth year]   HISTORY OF PRESENT ILLNESS:  This is an 83 year old male who is known to Dr. Carlean Purl for colonoscopy in 02/2009 at which time he was found to have moderate diverticulosis in the sigmoid colon with an excellent prep.  Due to history of prior adenomas that were removed in 11/2006 it was recommended that he have another colonoscopy at a 5 year interval if medically fit.  He is here today to request of his PCP, Dr. Ashby Dawes, for evaluation regarding anemia and Hemoccult-positive stools. The patient was found to have a hemoglobin of 9.1 g in February. Repeat hemoglobin earlier this month was 9.0 g. MCV is normal at 96.4. Ferritin is low at 19, serum iron slightly low at 35, iron saturation low at 8%. Vitamin B12 level was normal. He was Hemoccult negative in their office in February during a rectal exam. Then in March he performed 3 Hemoccult cards that were all positive for occult blood. He denies seeing blood in his stools or any black stools. He is on Eliquis for atrial fibrillation that is prescribed by his PCP.  He denies really any other GI complaints. He tells me that he has had 4 severe nosebleeds over the past year. One of those nosebleeds actually occurred about 5-7 days prior to his positive hemoccult stools tests.  He tells me that he was recently placed on iron supplements by his PCP.   Past Medical History:  Diagnosis Date  . Atrial fibrillation ([place])   . Barrett's esophagus   . BPH (benign prostatic hyperplasia)   . Hypothyroidism   . Sleep apnea   . Tubular adenoma    Past Surgical History:  Procedure Laterality Date  . CYSTOSCOPY      reports that he has quit smoking. He has never used smokeless tobacco. He reports that he drinks alcohol. He reports that he does not use drugs. family history includes Heart disease in his mother; Other in his father; Ovarian cancer in his sister. No Known Allergies    Outpatient Encounter  Prescriptions as of 04/23/2016  Medication Sig  . aspirin 81 MG chewable tablet Chew by mouth daily.  . Cholecalciferol (VITAMIN D3) 5000 units CAPS Take by mouth daily.  . Fe Fum-FePoly-Vit C-Vit B3 (INTEGRA) 62.5-62.5-40-3 MG CAPS daily.  . fexofenadine (ALLEGRA ALLERGY) 180 MG tablet Take 180 mg by mouth as needed for allergies or rhinitis.  . fluticasone (FLONASE) 50 MCG/ACT nasal spray Place 2 sprays into both nostrils daily.  . furosemide (LASIX) 20 MG tablet daily.  . Glucosamine-Chondroit-Vit C-Mn (GLUCOSAMINE CHONDR 500 COMPLEX PO) Take by mouth daily.  Marland Kitchen levothyroxine (SYNTHROID, LEVOTHROID) 112 MCG tablet daily.  Marland Kitchen losartan (COZAAR) 25 MG tablet daily.  Marland Kitchen olopatadine (PATANOL) 0.1 % ophthalmic solution 1 drop 2 (two) times daily. I drop affected eye bid  . omeprazole (PRILOSEC) 20 MG capsule daily.   No facility-administered encounter medications on file as of 04/23/2016.      REVIEW OF SYSTEMS  : All other systems reviewed and negative except where noted in the History of Present Illness.   PHYSICAL EXAM: BP (!) 118/54   Pulse 72   Ht 5\' 9"  (1.753 m)   Wt 250 lb 4 oz (113.5 kg)   BMI 36.96 kg/m  General: Well developed white male in no acute distress Head: Normocephalic and atraumatic Eyes:  Sclerae anicteric, conjunctiva pink. Ears: Normal auditory acuity Lungs: Clear throughout to auscultation Heart: Regular rate and  rhythm Abdomen: Soft, non-distended.  Normal bowel sounds.  Non-tender.  Small umbilical hernia noted. Rectal:  Will be done at the time of colonoscopy. Musculoskeletal: Symmetrical with no gross deformities  Skin: No lesions on visible extremities Extremities: No edema  Neurological: Alert oriented x 4, grossly non-focal Psychological:  Alert and cooperative. Normal mood and affect  ASSESSMENT AND PLAN: -IDA and heme positive stool:  Hemoglobin 9 g. Iron studies low.  Hemoccult positive x 3. Continue iron supplements per PCP.  Will schedule EGD and  colonoscopy with Dr. Carlean Purl to rule out GI source of bleeding.  ? If positive hemoccults could have been related to severe nosebleed that occurred 5-7 days prior to FOBT. -Personal history of colon polyps:  Was actually due for colonoscopy in 2016 for previous history of adenomatous colon polyps. -Chronic anticoagulation with Eliquis for atrial fibrillation:  Will hold Eliquis for 2 days prior to endoscopic procedures - will instruct when and how to resume after procedure. Benefits and risks of procedure explained including risks of bleeding, perforation, infection, missed lesions, reactions to medications and possible need for hospitalization and surgery for complications. Additional rare but real risk of stroke or other vascular clotting events off of Eliquis also explained and need to seek urgent help if any signs of these problems occur. Will communicate by phone or EMR with patient's prescribing provider, Dr. Ashby Dawes, to confirm that holding Eliquis is reasonable in this case.    CC:  Merrilee Seashore, MD  Agree with Ms. Bricelyn Freestone's management.  Gatha Mayer, MD, [facility]

## 2016-04-30 NOTE — Telephone Encounter (Signed)
Received fax from Dr Ashby Dawes about Eliquis, will send to be scanned in. Per Dr Alfonso Patten ok to hold Eliqius for 2 days prior to procedure  Resume Eliquis 24 hours after procedure  If polyps removed, if bleeding, resume Eliquis 48 hours after procedure- Signed by Dr Ashby Dawes    Pt aware

## 2016-05-08 DIAGNOSIS — D509 Iron deficiency anemia, unspecified: Secondary | ICD-10-CM | POA: Diagnosis not present

## 2016-05-14 ENCOUNTER — Encounter: Payer: Self-pay | Admitting: Internal Medicine

## 2016-05-15 DIAGNOSIS — D500 Iron deficiency anemia secondary to blood loss (chronic): Secondary | ICD-10-CM | POA: Diagnosis not present

## 2016-05-15 DIAGNOSIS — R195 Other fecal abnormalities: Secondary | ICD-10-CM | POA: Diagnosis not present

## 2016-05-28 ENCOUNTER — Encounter: Payer: Self-pay | Admitting: Internal Medicine

## 2016-05-28 ENCOUNTER — Ambulatory Visit (AMBULATORY_SURGERY_CENTER): Payer: PPO | Admitting: Internal Medicine

## 2016-05-28 VITALS — BP 99/57 | HR 58 | Temp 95.3°F | Resp 11 | Ht 69.0 in | Wt 250.0 lb

## 2016-05-28 DIAGNOSIS — D509 Iron deficiency anemia, unspecified: Secondary | ICD-10-CM | POA: Diagnosis not present

## 2016-05-28 DIAGNOSIS — D125 Benign neoplasm of sigmoid colon: Secondary | ICD-10-CM

## 2016-05-28 DIAGNOSIS — D500 Iron deficiency anemia secondary to blood loss (chronic): Secondary | ICD-10-CM

## 2016-05-28 DIAGNOSIS — I10 Essential (primary) hypertension: Secondary | ICD-10-CM | POA: Diagnosis not present

## 2016-05-28 DIAGNOSIS — I4891 Unspecified atrial fibrillation: Secondary | ICD-10-CM | POA: Diagnosis not present

## 2016-05-28 DIAGNOSIS — K5520 Angiodysplasia of colon without hemorrhage: Secondary | ICD-10-CM

## 2016-05-28 DIAGNOSIS — Z86010 Personal history of colonic polyps: Secondary | ICD-10-CM | POA: Diagnosis not present

## 2016-05-28 DIAGNOSIS — R195 Other fecal abnormalities: Secondary | ICD-10-CM | POA: Diagnosis not present

## 2016-05-28 DIAGNOSIS — G4733 Obstructive sleep apnea (adult) (pediatric): Secondary | ICD-10-CM | POA: Diagnosis not present

## 2016-05-28 HISTORY — DX: Angiodysplasia of colon without hemorrhage: K55.20

## 2016-05-28 MED ORDER — SODIUM CHLORIDE 0.9 % IV SOLN: 500.0000 mL | INTRAVENOUS | Status: DC

## 2016-05-28 NOTE — Progress Notes (Signed)
Report to PACU, RN, vss, BBS= Clear.  

## 2016-05-28 NOTE — Progress Notes (Signed)
Called to room to assist during endoscopic procedure.  Patient ID and intended procedure confirmed with present staff. Received instructions for my participation in the procedure from the performing physician.  

## 2016-05-28 NOTE — Op Note (Signed)
[place] Patient Name: Carl Woods Procedure Date: 05/28/2016 2:31 PM MRN: [id number] Endoscopist: Gatha Mayer , MD Age: 83 Referring MD:  Date of Birth: Sep 02, 1931 Gender: Male Account #: [id number] Procedure:                Upper GI endoscopy Indications:              Iron deficiency anemia secondary to chronic blood                            loss Medicines:                Propofol per Anesthesia, Monitored Anesthesia Care Procedure:                Pre-Anesthesia Assessment:                           - Prior to the procedure, a History and Physical                            was performed, and patient medications and                            allergies were reviewed. The patient's tolerance of                            previous anesthesia was also reviewed. The risks                            and benefits of the procedure and the sedation                            options and risks were discussed with the patient.                            All questions were answered, and informed consent                            was obtained. Prior Anticoagulants: The patient                            last took aspirin 1 day and Eliquis (apixaban) 2                            days prior to the procedure. ASA Grade Assessment:                            III - A patient with severe systemic disease. After                            reviewing the risks and benefits, the patient was                            deemed in satisfactory condition to undergo the  procedure.                           After obtaining informed consent, the endoscope was                            passed under direct vision. Throughout the                            procedure, the patient's blood pressure, pulse, and                            oxygen saturations were monitored continuously. The                            Model GIF-HQ190 [id number]) scope was introduced                    through the mouth, and advanced to the second part                            of duodenum. The upper GI endoscopy was                            accomplished without difficulty. The patient                            tolerated the procedure well. Scope In: Scope Out: Findings:                 The esophagus was normal.                           The stomach was normal.                           The examined duodenum was normal. Complications:            No immediate complications. Estimated Blood Loss:     Estimated blood loss: none. Impression:               - Normal esophagus.                           - Normal stomach.                           - Normal examined duodenum.                           - No specimens collected. Recommendation:           - Patient has a contact number available for                            emergencies. The signs and symptoms of potential                            delayed complications were discussed with the  patient. Return to normal activities tomorrow.                            Written discharge instructions were provided to the                            patient.                           - Resume previous diet.                           - Continue present medications.                           - See the other procedure note for documentation of                            additional recommendations. Gatha Mayer, MD 05/28/2016 3:21:29 PM This report has been signed electronically.

## 2016-05-28 NOTE — Op Note (Signed)
[place] Patient Name: Carl Woods Procedure Date: 05/28/2016 2:31 PM MRN: [id number] Endoscopist: Gatha Mayer , MD Age: 83 Referring MD:  Date of Birth: [birth year] Gender: Male Account #: [id number] Procedure:                Colonoscopy Indications:              Iron deficiency anemia secondary to chronic blood                            loss Medicines:                Propofol per Anesthesia, Monitored Anesthesia Care Procedure:                Pre-Anesthesia Assessment:                           - Prior to the procedure, a History and Physical                            was performed, and patient medications and                            allergies were reviewed. The patient's tolerance of                            previous anesthesia was also reviewed. The risks                            and benefits of the procedure and the sedation                            options and risks were discussed with the patient.                            All questions were answered, and informed consent                            was obtained. Prior Anticoagulants: The patient                            last took aspirin 1 day and Eliquis (apixaban) 2                            days prior to the procedure. ASA Grade Assessment:                            III - A patient with severe systemic disease. After                            reviewing the risks and benefits, the patient was                            deemed in satisfactory condition to undergo the  procedure.                           After obtaining informed consent, the colonoscope                            was passed under direct vision. Throughout the                            procedure, the patient's blood pressure, pulse, and                            oxygen saturations were monitored continuously. The                            Colonoscope was introduced through the anus and             advanced to the the cecum, identified by                            appendiceal orifice and ileocecal valve. The                            colonoscopy was performed without difficulty. The                            patient tolerated the procedure well. The quality                            of the bowel preparation was excellent. The bowel                            preparation used was Miralax. The ileocecal valve,                            appendiceal orifice, and rectum were photographed. Scope In: 2:38:46 PM Scope Out: 3:02:21 PM Scope Withdrawal Time: 0 hours 20 minutes 26 seconds  Total Procedure Duration: 0 hours 23 minutes 35 seconds  Findings:                 The perianal and digital rectal examinations were                            normal. Pertinent negatives include normal prostate                            (size, shape, and consistency).                           A single medium-sized angiodysplastic lesion                            without bleeding was found in the cecum. Soft                            coagulation treatment  for tissue destruction using                            snare was successful. To prevent bleeding                            post-intervention, two hemostatic clips were                            successfully placed (MR conditional). There was no                            bleeding during, or at the end, of the procedure.                            Estimated blood loss: none.                           Multiple small and large-mouthed diverticula were                            found in the sigmoid colon.                           A 4 mm polyp was found in the sigmoid colon. The                            polyp was sessile. The polyp was removed with a                            cold snare. Resection and retrieval were complete.                            Verification of patient identification for the                            specimen  was done. Estimated blood loss was minimal.                           The exam was otherwise without abnormality on                            direct and retroflexion views. Complications:            No immediate complications. Estimated Blood Loss:     Estimated blood loss: none. Impression:               - A single non-bleeding colonic angiodysplastic                            lesion. Treated with a hot snare. Clips (MR                            conditional) were placed.                           -  Diverticulosis in the sigmoid colon.                           - One 4 mm polyp in the sigmoid colon, removed with                            a cold snare. Resected and retrieved.                           - The examination was otherwise normal on direct                            and retroflexion views. Recommendation:           - Patient has a contact number available for                            emergencies. The signs and symptoms of potential                            delayed complications were discussed with the                            patient. Return to normal activities tomorrow.                            Written discharge instructions were provided to the                            patient.                           - No repeat colonoscopy due to age.                           - Resume previous diet.                           - Continue present medications.                           - Resume aspirin today and Eliquis (apixaban) in 5                            days at prior doses.                           - See ENT about epistaxis problems Gatha Mayer, MD 05/28/2016 3:26:13 PM This report has been signed electronically.

## 2016-05-28 NOTE — Patient Instructions (Addendum)
I found an AVM or angiodysplasia lesion in the cecum and ablated or destroyed it. I believe this was contributing to blood loss and anemia.  Also one tiny polyp removed - not to worry looks benign. Not related to blood loss or anemia.  Please wait to restart your Eliquis until Monday May 7  Keep on the iron supplements and follow-up with Dr. Ashby Dawes. Given the history of nosebleeds I think you should see an ENT specialist if you have not already.  I appreciate the opportunity to care for you. Gatha Mayer, MD, [facility]  Handouts given: Polyps. YOU HAD AN ENDOSCOPIC PROCEDURE TODAY AT [facility]:   Refer to the procedure report that was given to you for any specific questions about what was found during the examination.  If the procedure report does not answer your questions, please call your gastroenterologist to clarify.  If you requested that your care partner not be given the details of your procedure findings, then the procedure report has been included in a sealed envelope for you to review at your convenience later.  YOU SHOULD EXPECT: Some feelings of bloating in the abdomen. Passage of more gas than usual.  Walking can help get rid of the air that was put into your GI tract during the procedure and reduce the bloating. If you had a lower endoscopy (such as a colonoscopy or flexible sigmoidoscopy) you may notice spotting of blood in your stool or on the toilet paper. If you underwent a bowel prep for your procedure, you may not have a normal bowel movement for a few days.  Please Note:  You might notice some irritation and congestion in your nose or some drainage.  This is from the oxygen used during your procedure.  There is no need for concern and it should clear up in a day or so.  SYMPTOMS TO REPORT IMMEDIATELY:   Following lower endoscopy (colonoscopy or flexible sigmoidoscopy):  Excessive amounts of blood in the stool  Significant tenderness or  worsening of abdominal pains  Swelling of the abdomen that is new, acute  Fever of 100F or higher   Following upper endoscopy (EGD)  Vomiting of blood or coffee ground material  New chest pain or pain under the shoulder blades  Painful or persistently difficult swallowing  New shortness of breath  Fever of 100F or higher  Black, tarry-looking stools  For urgent or emergent issues, a gastroenterologist can be reached at any hour by calling [phone].   DIET:  We do recommend a small meal at first, but then you may proceed to your regular diet.  Drink plenty of fluids but you should avoid alcoholic beverages for 24 hours.  ACTIVITY:  You should plan to take it easy for the rest of today and you should NOT DRIVE or use heavy machinery until tomorrow (because of the sedation medicines used during the test).    FOLLOW UP: Our staff will call the number listed on your records the next business day following your procedure to check on you and address any questions or concerns that you may have regarding the information given to you following your procedure. If we do not reach you, we will leave a message.  However, if you are feeling well and you are not experiencing any problems, there is no need to return our call.  We will assume that you have returned to your regular daily activities without incident.  If any biopsies were taken you  will be contacted by phone or by letter within the next 1-3 weeks.  Please call us at [phone] if you have not heard about the biopsies in 3 weeks.    SIGNATURES/CONFIDENTIALITY: You and/or your care partner have signed paperwork which will be entered into your electronic medical record.  These signatures attest to the fact that that the information above on your After Visit Summary has been reviewed and is understood.  Full responsibility of the confidentiality of this discharge information lies with you and/or your care-partner.

## 2016-05-29 ENCOUNTER — Telehealth: Payer: Self-pay | Admitting: *Deleted

## 2016-05-29 NOTE — Telephone Encounter (Signed)
  Follow up Call-  Call back number 05/28/2016  Post procedure Call Back phone  # [phone]  Permission to leave phone message Yes  Some recent data might be hidden     Patient questions:  Do you have a fever, pain , or abdominal swelling? No. Pain Score  0 *  Have you tolerated food without any problems? Yes.    Have you been able to return to your normal activities? Yes.    Do you have any questions about your discharge instructions: Diet   No. Medications  No. Follow up visit  No.  Do you have questions or concerns about your Care? No.  Actions: * If pain score is 4 or above: No action needed, pain <4.

## 2016-06-03 ENCOUNTER — Encounter: Payer: Self-pay | Admitting: Internal Medicine

## 2016-06-03 DIAGNOSIS — Z86010 Personal history of colonic polyps: Secondary | ICD-10-CM

## 2016-07-08 DIAGNOSIS — J342 Deviated nasal septum: Secondary | ICD-10-CM | POA: Diagnosis not present

## 2016-07-08 DIAGNOSIS — I4891 Unspecified atrial fibrillation: Secondary | ICD-10-CM | POA: Insufficient documentation

## 2016-07-08 DIAGNOSIS — Z7901 Long term (current) use of anticoagulants: Secondary | ICD-10-CM | POA: Diagnosis not present

## 2016-07-08 DIAGNOSIS — R040 Epistaxis: Secondary | ICD-10-CM | POA: Insufficient documentation

## 2016-07-08 DIAGNOSIS — D649 Anemia, unspecified: Secondary | ICD-10-CM | POA: Diagnosis not present

## 2016-09-04 DIAGNOSIS — R195 Other fecal abnormalities: Secondary | ICD-10-CM | POA: Diagnosis not present

## 2016-09-11 DIAGNOSIS — D509 Iron deficiency anemia, unspecified: Secondary | ICD-10-CM | POA: Diagnosis not present

## 2016-09-11 DIAGNOSIS — M7731 Calcaneal spur, right foot: Secondary | ICD-10-CM | POA: Diagnosis not present

## 2016-09-11 DIAGNOSIS — N183 Chronic kidney disease, stage 3 (moderate): Secondary | ICD-10-CM | POA: Diagnosis not present

## 2016-09-11 DIAGNOSIS — R7303 Prediabetes: Secondary | ICD-10-CM | POA: Diagnosis not present

## 2016-09-11 DIAGNOSIS — E039 Hypothyroidism, unspecified: Secondary | ICD-10-CM | POA: Diagnosis not present

## 2016-10-29 DIAGNOSIS — H2513 Age-related nuclear cataract, bilateral: Secondary | ICD-10-CM | POA: Diagnosis not present

## 2016-10-29 DIAGNOSIS — H40013 Open angle with borderline findings, low risk, bilateral: Secondary | ICD-10-CM | POA: Diagnosis not present

## 2016-10-29 DIAGNOSIS — H43813 Vitreous degeneration, bilateral: Secondary | ICD-10-CM | POA: Diagnosis not present

## 2016-10-29 DIAGNOSIS — H524 Presbyopia: Secondary | ICD-10-CM | POA: Diagnosis not present

## 2017-01-01 DIAGNOSIS — R7303 Prediabetes: Secondary | ICD-10-CM | POA: Diagnosis not present

## 2017-01-01 DIAGNOSIS — D509 Iron deficiency anemia, unspecified: Secondary | ICD-10-CM | POA: Diagnosis not present

## 2017-01-08 DIAGNOSIS — R7303 Prediabetes: Secondary | ICD-10-CM | POA: Diagnosis not present

## 2017-01-08 DIAGNOSIS — G4733 Obstructive sleep apnea (adult) (pediatric): Secondary | ICD-10-CM | POA: Diagnosis not present

## 2017-01-08 DIAGNOSIS — Z23 Encounter for immunization: Secondary | ICD-10-CM | POA: Diagnosis not present

## 2017-01-08 DIAGNOSIS — I10 Essential (primary) hypertension: Secondary | ICD-10-CM | POA: Diagnosis not present

## 2017-01-08 DIAGNOSIS — I480 Paroxysmal atrial fibrillation: Secondary | ICD-10-CM | POA: Diagnosis not present

## 2017-01-08 DIAGNOSIS — N183 Chronic kidney disease, stage 3 (moderate): Secondary | ICD-10-CM | POA: Diagnosis not present

## 2017-01-08 DIAGNOSIS — E039 Hypothyroidism, unspecified: Secondary | ICD-10-CM | POA: Diagnosis not present

## 2017-04-29 DIAGNOSIS — H40013 Open angle with borderline findings, low risk, bilateral: Secondary | ICD-10-CM | POA: Diagnosis not present

## 2017-06-16 DIAGNOSIS — E039 Hypothyroidism, unspecified: Secondary | ICD-10-CM | POA: Diagnosis not present

## 2017-06-16 DIAGNOSIS — R7303 Prediabetes: Secondary | ICD-10-CM | POA: Diagnosis not present

## 2017-06-16 DIAGNOSIS — Z0000 Encounter for general adult medical examination without abnormal findings: Secondary | ICD-10-CM | POA: Diagnosis not present

## 2017-06-16 DIAGNOSIS — I10 Essential (primary) hypertension: Secondary | ICD-10-CM | POA: Diagnosis not present

## 2017-06-16 DIAGNOSIS — N183 Chronic kidney disease, stage 3 (moderate): Secondary | ICD-10-CM | POA: Diagnosis not present

## 2017-06-23 DIAGNOSIS — J329 Chronic sinusitis, unspecified: Secondary | ICD-10-CM | POA: Diagnosis not present

## 2017-06-23 DIAGNOSIS — G4733 Obstructive sleep apnea (adult) (pediatric): Secondary | ICD-10-CM | POA: Diagnosis not present

## 2017-06-23 DIAGNOSIS — R7303 Prediabetes: Secondary | ICD-10-CM | POA: Diagnosis not present

## 2017-06-23 DIAGNOSIS — D500 Iron deficiency anemia secondary to blood loss (chronic): Secondary | ICD-10-CM | POA: Diagnosis not present

## 2017-06-23 DIAGNOSIS — E039 Hypothyroidism, unspecified: Secondary | ICD-10-CM | POA: Diagnosis not present

## 2017-06-23 DIAGNOSIS — I480 Paroxysmal atrial fibrillation: Secondary | ICD-10-CM | POA: Diagnosis not present

## 2017-06-23 DIAGNOSIS — N183 Chronic kidney disease, stage 3 (moderate): Secondary | ICD-10-CM | POA: Diagnosis not present

## 2017-08-06 ENCOUNTER — Ambulatory Visit (INDEPENDENT_AMBULATORY_CARE_PROVIDER_SITE_OTHER): Payer: PPO | Admitting: Gastroenterology

## 2017-08-06 ENCOUNTER — Encounter: Payer: Self-pay | Admitting: Gastroenterology

## 2017-08-06 ENCOUNTER — Encounter

## 2017-08-06 ENCOUNTER — Telehealth: Payer: Self-pay | Admitting: Gastroenterology

## 2017-08-06 VITALS — BP 114/68 | HR 74 | Ht 68.0 in | Wt 240.4 lb

## 2017-08-06 DIAGNOSIS — R21 Rash and other nonspecific skin eruption: Secondary | ICD-10-CM

## 2017-08-06 MED ORDER — NYSTATIN-TRIAMCINOLONE 100000-0.1 UNIT/GM-% EX OINT: 1.0000 "application " | TOPICAL_OINTMENT | Freq: Two times a day (BID) | CUTANEOUS | 0 refills | Status: DC

## 2017-08-06 MED ORDER — HYDROCORTISONE 2.5 % RE CREA: 1.0000 "application " | TOPICAL_CREAM | Freq: Two times a day (BID) | RECTAL | 0 refills | Status: DC

## 2017-08-06 NOTE — Progress Notes (Addendum)
08/19/2017 Carl Woods [id number] [birth year]   HISTORY OF PRESENT ILLNESS: This is an 84 year old male who is a patient of Dr. Celesta Aver.  He just had EGD and colonoscopy in May 2018 for evaluation of iron deficiency anemia.  Colonoscopy revealed one AVM to which a clip was placed.  He also had diverticulosis and one polyp that was removed and was a tubular adenoma.  He presents to our office today with complaints of anal pain, rectal bleeding that he describes as "pink" in color.  He says that he thought maybe this is all from his hemorrhoids.  He has been having "oozing" of some mucus type material from his rectum.  Says he has been doing sitz bath and using hydrocortisone 2.5% cream to his perianal area, but has only seen minimal improvement.  Says it is very sore.  Was having diarrhea when he was on an antibiotic for a respiratory infection but stools are back to normal now.  This all seemed to get worse at that time, which was back in May.    Past Medical History:  Diagnosis Date  . Atrial fibrillation ([place])   . Barrett's esophagus   . BPH (benign prostatic hyperplasia)   . 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 Hypothyroidism   . Iron deficiency anemia   . Sleep apnea   . Tubular adenoma    Past Surgical History:  Procedure Laterality Date  . COLONOSCOPY  multiple  . CYSTOSCOPY    . ESOPHAGOGASTRODUODENOSCOPY  multiple    reports that he has quit smoking. He has never used smokeless tobacco. He reports that he drinks alcohol. He reports that he does not use drugs. family history includes Heart disease in his mother; Other in his father; Ovarian cancer in his sister. No Known Allergies    Outpatient Encounter Medications as of 08/06/2017  Medication Sig  . apixaban (ELIQUIS) 5 MG TABS tablet Take 5 mg by mouth 2 (two) times daily.  Marland Kitchen aspirin 81 MG chewable tablet Chew by mouth daily.  . Cholecalciferol (VITAMIN D3) 5000 units CAPS Take by mouth  daily.  . Fe Fum-FePoly-Vit C-Vit B3 (INTEGRA) 62.5-62.5-40-3 MG CAPS daily.  . fexofenadine (ALLEGRA ALLERGY) 180 MG tablet Take 180 mg by mouth as needed for allergies or rhinitis.  . fluticasone (FLONASE) 50 MCG/ACT nasal spray Place 2 sprays into both nostrils daily.  . furosemide (LASIX) 20 MG tablet daily.  . Glucosamine-Chondroit-Vit C-Mn (GLUCOSAMINE CHONDR 500 COMPLEX PO) Take by mouth daily.  Marland Kitchen levothyroxine (SYNTHROID, LEVOTHROID) 112 MCG tablet daily.  Marland Kitchen losartan (COZAAR) 25 MG tablet daily.  Marland Kitchen olopatadine (PATANOL) 0.1 % ophthalmic solution 1 drop 2 (two) times daily. I drop affected eye bid  . omeprazole (PRILOSEC) 20 MG capsule daily.  . hydrocortisone (PROCTOSOL HC) 2.5 % rectal cream Place 1 application rectally 2 (two) times daily.  Marland Kitchen nystatin-triamcinolone ointment (MYCOLOG) Apply 1 application topically 2 (two) times daily.  . [DISCONTINUED] 0.9 %  sodium chloride infusion    No facility-administered encounter medications on file as of 08/06/2017.      REVIEW OF SYSTEMS  : All other systems reviewed and negative except where noted in the History of Present Illness.   PHYSICAL EXAM: BP 114/68   Pulse 74   Ht 5\' 8"  (1.727 m)   Wt 240 lb 6 oz (109 kg)   BMI 36.55 kg/m  General: Well developed white male in no acute distress Head: Normocephalic and atraumatic Eyes:  Sclerae  anicteric, conjunctiva pink. Ears: Normal auditory acuity Lungs: Clear throughout to auscultation; no increased WOB. Heart: Regular rate and rhythm; no M/R/G. Abdomen: Soft, non-distended.  Normal bowel sounds.  Non-tender. Rectal:  Significant perianal rash/irritation.  DRE did not reveal any masses but there was some mucus with light brown stool streaks. Musculoskeletal: Symmetrical with no gross deformities  Skin: No lesions on visible extremities Extremities: No edema  Neurological: Alert oriented x 4, grossly non-focal Psychological:  Alert and cooperative. Normal mood and  affect  ASSESSMENT AND PLAN: *Perianal rash:  Minimal improvement with hydrocortisone cream alone.  I think that he is having some leakage of this mucus and stool around his anus, which is causing a lot of irritation.  I am going to have him use hydrocortisone cream per rectum (proctofoam) to see if that helps any internal hemorrhoid issue.  Will try nystatin-triamcinolone combo externally.  Will have him return in 1-2 weeks for update and to reexamine.     CC:  Merrilee Seashore, MD  Agree with Ms. Madhavi Hamblen's management. If conservative measures fail will consider banding of hemorrhoids - will need to consider holding Eliquis - maybe not Gatha Mayer, MD, [facility]

## 2017-08-06 NOTE — Patient Instructions (Signed)
Hydrocortisone rectal cream at bedtime for 1 week  Nystatin cream three times a day to perianal area

## 2017-08-14 NOTE — Telephone Encounter (Signed)
Left message on patients voicemail with name of medication that his insurance would not cover. Proctosol HC

## 2017-08-19 NOTE — Addendum Note (Signed)
Addended by: Gatha Mayer on: 08/19/2017 09:46 PM   Modules accepted: Orders

## 2017-08-20 ENCOUNTER — Encounter: Payer: Self-pay | Admitting: Gastroenterology

## 2017-08-20 ENCOUNTER — Ambulatory Visit (INDEPENDENT_AMBULATORY_CARE_PROVIDER_SITE_OTHER): Payer: PPO | Admitting: Gastroenterology

## 2017-08-20 VITALS — BP 110/58 | HR 64 | Ht 68.0 in | Wt 237.4 lb

## 2017-08-20 DIAGNOSIS — R21 Rash and other nonspecific skin eruption: Secondary | ICD-10-CM | POA: Diagnosis not present

## 2017-08-20 MED ORDER — NYSTATIN-TRIAMCINOLONE 100000-0.1 UNIT/GM-% EX OINT: 1.0000 "application " | TOPICAL_OINTMENT | Freq: Two times a day (BID) | CUTANEOUS | 0 refills | Status: DC

## 2017-08-20 NOTE — Progress Notes (Addendum)
     08/20/2017 Carl Woods [id number] [birth year]   HISTORY OF PRESENT ILLNESS:  Here for follow-up of perianal rash.  Is using hydrocortisone cream internally and nystatin-triamcinolone cream externally.  Feels like it has improved particularly over the past 3-4 days.  Not as sore and burning is less.   Past Medical History:  Diagnosis Date  . Atrial fibrillation ([place])   . Barrett's esophagus   . BPH (benign prostatic hyperplasia)   . 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 Hypothyroidism   . Iron deficiency anemia   . Sleep apnea   . Tubular adenoma    Past Surgical History:  Procedure Laterality Date  . COLONOSCOPY  multiple  . CYSTOSCOPY    . ESOPHAGOGASTRODUODENOSCOPY  multiple    reports that he has quit smoking. He has never used smokeless tobacco. He reports that he drinks alcohol. He reports that he does not use drugs. family history includes Heart disease in his mother; Other in his father; Ovarian cancer in his sister. No Known Allergies    Outpatient Encounter Medications as of 08/20/2017  Medication Sig  . apixaban (ELIQUIS) 5 MG TABS tablet Take 5 mg by mouth 2 (two) times daily.  Marland Kitchen aspirin 81 MG chewable tablet Chew by mouth daily.  . Cholecalciferol (VITAMIN D3) 5000 units CAPS Take by mouth daily.  . Fe Fum-FePoly-Vit C-Vit B3 (INTEGRA) 62.5-62.5-40-3 MG CAPS daily.  . fexofenadine (ALLEGRA ALLERGY) 180 MG tablet Take 180 mg by mouth as needed for allergies or rhinitis.  . fluticasone (FLONASE) 50 MCG/ACT nasal spray Place 2 sprays into both nostrils daily.  . furosemide (LASIX) 20 MG tablet daily.  . Glucosamine-Chondroit-Vit C-Mn (GLUCOSAMINE CHONDR 500 COMPLEX PO) Take by mouth daily.  . hydrocortisone (PROCTOSOL HC) 2.5 % rectal cream Place 1 application rectally 2 (two) times daily.  Marland Kitchen levothyroxine (SYNTHROID, LEVOTHROID) 112 MCG tablet daily.  Marland Kitchen losartan (COZAAR) 25 MG tablet daily.  Marland Kitchen nystatin-triamcinolone ointment (MYCOLOG)  Apply 1 application topically 2 (two) times daily.  Marland Kitchen olopatadine (PATANOL) 0.1 % ophthalmic solution 1 drop 2 (two) times daily. I drop affected eye bid  . omeprazole (PRILOSEC) 20 MG capsule daily.   No facility-administered encounter medications on file as of 08/20/2017.      REVIEW OF SYSTEMS  : All other systems reviewed and negative except where noted in the History of Present Illness.   PHYSICAL EXAM: BP (!) 110/58   Pulse 64   Ht 5\' 8"  (1.727 m)   Wt 237 lb 6 oz (107.7 kg)   BMI 36.09 kg/m  General: Well developed white male in no acute distress Head: Normocephalic and atraumatic Eyes:  Sclerae anicteric, conjunctiva pink. Ears: Normal auditory acuity Rectal:  Significant perianal rash/irritation, still very red but seems improved from 2 weeks ago.  DRE did not reveal any masses.  Small amount of light brown stool on exam glove. Musculoskeletal: Symmetrical with no gross deformities  Skin: No lesions on visible extremities Extremities: No edema  Neurological: Alert oriented x 4, grossly non-focal Psychological:  Alert and cooperative. Normal mood and affect  ASSESSMENT AND PLAN: *Perianal rash:  Seems to be improved from 2 weeks ago.  Will continue with the nystatin-triamcinolone combo externally and hydrocortisone cream internally and have him return again in 4 weeks.    CC:  Merrilee Seashore, MD  Agree with Ms. Jasmina Gendron's management.  Gatha Mayer, MD, [facility]

## 2017-08-20 NOTE — Patient Instructions (Signed)
Conitnue Triamcinolone Cream.

## 2017-09-17 ENCOUNTER — Encounter (INDEPENDENT_AMBULATORY_CARE_PROVIDER_SITE_OTHER): Payer: Self-pay

## 2017-09-17 ENCOUNTER — Encounter: Payer: Self-pay | Admitting: Gastroenterology

## 2017-09-17 ENCOUNTER — Ambulatory Visit (INDEPENDENT_AMBULATORY_CARE_PROVIDER_SITE_OTHER): Payer: PPO | Admitting: Gastroenterology

## 2017-09-17 VITALS — BP 126/58 | HR 84 | Ht 68.0 in | Wt 242.0 lb

## 2017-09-17 DIAGNOSIS — R21 Rash and other nonspecific skin eruption: Secondary | ICD-10-CM

## 2017-09-17 MED ORDER — TALC EX POWD: CUTANEOUS | 0 refills | Status: DC | PRN

## 2017-09-17 NOTE — Patient Instructions (Addendum)
We have sent the following medications to your pharmacy for you to pick up at your convenience:  Zeasorb talc powder three times a day   We have made you an appointment with [facility] [place] on 10-27-17 at 3:00 pm. Their address is [address] [place], [place] [address]. Please bring your insurance cards and current medications.   We will call you with a follow up appointment.

## 2017-09-17 NOTE — Progress Notes (Addendum)
09/17/2017 Carl Woods [id number] [birth year]   HISTORY OF PRESENT ILLNESS:  Here for follow-up of his perianal rash.  He feels like it has improved, but is still sore in one spot on the left and feels like a bulge.  Burns when wiping and sometimes sees a small amount of bright red blood on the toilet paper.  Last colonoscopy was in May 2018 for evaluation of iron deficiency anemia.  Colonoscopy revealed one AVM to which a clip was placed.  He also had diverticulosis.    Past Medical History:  Diagnosis Date  . Atrial fibrillation ([place])   . Barrett's esophagus   . BPH (benign prostatic hyperplasia)   . 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 Hypothyroidism   . Iron deficiency anemia   . Sleep apnea   . Tubular adenoma    Past Surgical History:  Procedure Laterality Date  . COLONOSCOPY  multiple  . CYSTOSCOPY    . ESOPHAGOGASTRODUODENOSCOPY  multiple    reports that he has quit smoking. He has never used smokeless tobacco. He reports that he drinks alcohol. He reports that he does not use drugs. family history includes Heart disease in his mother; Other in his father; Ovarian cancer in his sister. No Known Allergies    Outpatient Encounter Medications as of 09/17/2017  Medication Sig  . apixaban (ELIQUIS) 5 MG TABS tablet Take 5 mg by mouth 2 (two) times daily.  Marland Kitchen aspirin 81 MG chewable tablet Chew by mouth daily.  . Cholecalciferol (VITAMIN D3) 5000 units CAPS Take by mouth daily.  . Fe Fum-FePoly-Vit C-Vit B3 (INTEGRA) 62.5-62.5-40-3 MG CAPS daily.  . fexofenadine (ALLEGRA ALLERGY) 180 MG tablet Take 180 mg by mouth as needed for allergies or rhinitis.  . fluticasone (FLONASE) 50 MCG/ACT nasal spray Place 2 sprays into both nostrils daily.  . furosemide (LASIX) 20 MG tablet daily.  . Glucosamine-Chondroit-Vit C-Mn (GLUCOSAMINE CHONDR 500 COMPLEX PO) Take by mouth daily.  . hydrocortisone (PROCTOSOL HC) 2.5 % rectal cream Place 1 application rectally  2 (two) times daily.  Marland Kitchen levothyroxine (SYNTHROID, LEVOTHROID) 112 MCG tablet daily.  Marland Kitchen losartan (COZAAR) 25 MG tablet daily.  Marland Kitchen nystatin-triamcinolone ointment (MYCOLOG) Apply 1 application topically 2 (two) times daily.  Marland Kitchen olopatadine (PATANOL) 0.1 % ophthalmic solution 1 drop 2 (two) times daily. I drop affected eye bid  . omeprazole (PRILOSEC) 20 MG capsule daily.   No facility-administered encounter medications on file as of 09/17/2017.      REVIEW OF SYSTEMS  : All other systems reviewed and negative except where noted in the History of Present Illness.   PHYSICAL EXAM: BP (!) 126/58   Pulse 84   Ht 5\' 8"  (1.727 m)   Wt 242 lb (109.8 kg)   BMI 36.80 kg/m  General: Well developed white male in no acute distress Head: Normocephalic and atraumatic Eyes:  Sclerae anicteric, conjunctiva pink. Ears: Normal auditory acuity Rectal:  Perianal rash noted that is still very red, but seems improved except for area on the left that seems to have some macerated skin.  DRE did not reveal any masses.  Anoscopy did not reveal any proctitis.  Musculoskeletal: Symmetrical with no gross deformities  Skin: No lesions on visible extremities Extremities: No edema  Neurological: Alert oriented x 4, grossly non-focal Psychological:  Alert and cooperative. Normal mood and affect  ASSESSMENT AND PLAN: *Perianal rash:  Seems to be improved except for area on the left with what seems to  be macerated skin.  Will continue with the nystatin-triamcinolone combo BID and will add Zeasorb powder, which he can use few times per day.  I would like him to see dermatology about this to see if they have any other recommendations.  ? If clotrimazole-betamethasone cream would be better.  Will follow-up here again after their visit.  CC:  Merrilee Seashore, MD  Seen w/ Ms. Dianelly Ferran and agree  Gatha Mayer, MD, [facility]

## 2017-10-19 ENCOUNTER — Other Ambulatory Visit: Payer: Self-pay | Admitting: Gastroenterology

## 2017-10-27 DIAGNOSIS — L089 Local infection of the skin and subcutaneous tissue, unspecified: Secondary | ICD-10-CM | POA: Diagnosis not present

## 2017-10-27 DIAGNOSIS — L290 Pruritus ani: Secondary | ICD-10-CM | POA: Diagnosis not present

## 2017-10-27 DIAGNOSIS — L309 Dermatitis, unspecified: Secondary | ICD-10-CM | POA: Diagnosis not present

## 2017-11-05 ENCOUNTER — Other Ambulatory Visit: Payer: Self-pay | Admitting: Physician Assistant

## 2017-11-05 DIAGNOSIS — C4491 Basal cell carcinoma of skin, unspecified: Secondary | ICD-10-CM

## 2017-11-05 DIAGNOSIS — C44319 Basal cell carcinoma of skin of other parts of face: Secondary | ICD-10-CM | POA: Diagnosis not present

## 2017-11-05 HISTORY — DX: Basal cell carcinoma of skin, unspecified: C44.91

## 2017-11-13 ENCOUNTER — Encounter: Payer: Self-pay | Admitting: Gastroenterology

## 2017-11-13 ENCOUNTER — Ambulatory Visit (INDEPENDENT_AMBULATORY_CARE_PROVIDER_SITE_OTHER): Payer: PPO | Admitting: Gastroenterology

## 2017-11-13 VITALS — BP 112/50 | HR 69 | Ht 68.0 in | Wt 243.0 lb

## 2017-11-13 DIAGNOSIS — R21 Rash and other nonspecific skin eruption: Secondary | ICD-10-CM

## 2017-11-13 NOTE — Patient Instructions (Signed)
You have been scheduled for an MRI at [facility] on 11/22/17. Your appointment time is 10:50AM. Please arrive 20 minutes prior to your appointment time for registration purposes. Please make certain not to have anything to eat or drink 6 hours prior to your test. In addition, if you have any metal in your body, have a pacemaker or defibrillator, please be sure to let your ordering physician know. This test typically takes 45 minutes to 1 hour to complete. Should you need to reschedule, please call [phone] to do so.   I appreciate the opportunity to care for you. [place] Bogus, PA-C

## 2017-11-13 NOTE — Progress Notes (Signed)
11/13/2017 Carl Woods [id number] 1931/06/05   HISTORY OF PRESENT ILLNESS:  Here for follow-up of his perianal rash.  When he was here last I had him seen by Dr. Carlean Purl.  We decided to refer him to dermatology.  He tells me that they treated him with some antibiotics that did not help.  They also performed a culture which is showed E. coli.  They referred him back here to rule out fistulous tract.  He tells me that his symptoms are about the same.  He has continued to use the nystatin/triamcinolone cream.  He tried the powder but it did not seem to make a tremendous difference.  Last colonoscopy was in May 2018 for evaluation of iron deficiency anemia.  Colonoscopy revealed one AVM to which a clip was placed.  He also had diverticulosis.    Past Medical History:  Diagnosis Date  . Atrial fibrillation ([place])   . Barrett's esophagus   . BPH (benign prostatic hyperplasia)   . 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 Hypothyroidism   . Iron deficiency anemia   . Sleep apnea   . Tubular adenoma    Past Surgical History:  Procedure Laterality Date  . COLONOSCOPY  multiple  . CYSTOSCOPY    . ESOPHAGOGASTRODUODENOSCOPY  multiple    reports that he has quit smoking. He has never used smokeless tobacco. He reports that he drinks alcohol. He reports that he does not use drugs. family history includes Heart disease in his mother; Other in his father; Ovarian cancer in his sister. No Known Allergies    Outpatient Encounter Medications as of 11/13/2017  Medication Sig  . apixaban (ELIQUIS) 5 MG TABS tablet Take 5 mg by mouth 2 (two) times daily.  Marland Kitchen aspirin 81 MG chewable tablet Chew by mouth daily.  . Cholecalciferol (VITAMIN D3) 5000 units CAPS Take by mouth daily.  . Fe Fum-FePoly-Vit C-Vit B3 (INTEGRA) 62.5-62.5-40-3 MG CAPS daily.  . fexofenadine (ALLEGRA ALLERGY) 180 MG tablet Take 180 mg by mouth as needed for allergies or rhinitis.  . fluticasone (FLONASE) 50  MCG/ACT nasal spray Place 2 sprays into both nostrils daily.  . furosemide (LASIX) 20 MG tablet daily.  . Glucosamine-Chondroit-Vit C-Mn (GLUCOSAMINE CHONDR 500 COMPLEX PO) Take by mouth daily.  . hydrocortisone (PROCTOSOL HC) 2.5 % rectal cream Place 1 application rectally 2 (two) times daily.  Marland Kitchen levothyroxine (SYNTHROID, LEVOTHROID) 112 MCG tablet daily.  Marland Kitchen losartan (COZAAR) 25 MG tablet daily.  Marland Kitchen nystatin-triamcinolone ointment (MYCOLOG) Apply 1 application topically 2 (two) times daily.  Marland Kitchen olopatadine (PATANOL) 0.1 % ophthalmic solution 1 drop 2 (two) times daily. I drop affected eye bid  . omeprazole (PRILOSEC) 20 MG capsule daily.  Marland Kitchen talc powder Apply topically as needed.  . [DISCONTINUED] nystatin-triamcinolone ointment (MYCOLOG) APPLY 1 APPLICATION TO AFFECTED AREA 2 TIMES A DAY   No facility-administered encounter medications on file as of 11/13/2017.      REVIEW OF SYSTEMS  : All other systems reviewed and negative except where noted in the History of Present Illness.   PHYSICAL EXAM: BP (!) 112/50   Pulse 69   Ht 5\' 8"  (1.727 m)   Wt 243 lb (110.2 kg)   BMI 36.95 kg/m  General: Well developed white male Rectal:  Not examined today.  ASSESSMENT AND PLAN: *Perianal rash, non-healing:  No improvement with abx at derm.  They referred back here to rule out fistula.  Will schedule MRI pelvis to rule out  fistula tract.  In the meantime will continue with the nystatin-triamcinolone combo BID.  ? If clotrimazole-betamethasone cream would be better.  CC:  Merrilee Seashore, MD

## 2017-11-22 ENCOUNTER — Ambulatory Visit
Admission: RE | Admit: 2017-11-22 | Discharge: 2017-11-22 | Disposition: A | Discharge status: Home / Self Care | Payer: PPO | Source: Ambulatory Visit | Attending: Gastroenterology | Admitting: Gastroenterology

## 2017-11-22 DIAGNOSIS — K6289 Other specified diseases of anus and rectum: Secondary | ICD-10-CM | POA: Diagnosis not present

## 2017-11-22 DIAGNOSIS — R21 Rash and other nonspecific skin eruption: Secondary | ICD-10-CM

## 2017-11-22 MED ORDER — GADOBENATE DIMEGLUMINE 529 MG/ML IV SOLN
10.0000 mL | Freq: Once | INTRAVENOUS | Status: AC | PRN
  Administered 2017-11-22: 10 mL via INTRAVENOUS

## 2017-11-24 ENCOUNTER — Telehealth: Payer: Self-pay | Admitting: Gastroenterology

## 2017-11-24 NOTE — Telephone Encounter (Signed)
The pt aware Janett Billow has not reviewed MRI as of today and we will call when reviewed.  Fernand Parkins

## 2017-11-24 NOTE — Telephone Encounter (Signed)
Pt called inquiring about MRI results. Pls call him

## 2017-12-02 ENCOUNTER — Ambulatory Visit: Payer: PPO | Admitting: Gastroenterology

## 2017-12-15 ENCOUNTER — Encounter: Payer: Self-pay | Admitting: Surgery

## 2017-12-15 ENCOUNTER — Ambulatory Visit: Payer: Self-pay | Admitting: Surgery

## 2017-12-15 DIAGNOSIS — H524 Presbyopia: Secondary | ICD-10-CM | POA: Diagnosis not present

## 2017-12-15 DIAGNOSIS — Z7901 Long term (current) use of anticoagulants: Secondary | ICD-10-CM | POA: Diagnosis not present

## 2017-12-15 DIAGNOSIS — H40013 Open angle with borderline findings, low risk, bilateral: Secondary | ICD-10-CM | POA: Diagnosis not present

## 2017-12-15 DIAGNOSIS — Z01818 Encounter for other preprocedural examination: Secondary | ICD-10-CM | POA: Diagnosis not present

## 2017-12-15 DIAGNOSIS — H2513 Age-related nuclear cataract, bilateral: Secondary | ICD-10-CM | POA: Diagnosis not present

## 2017-12-15 DIAGNOSIS — H25013 Cortical age-related cataract, bilateral: Secondary | ICD-10-CM | POA: Diagnosis not present

## 2017-12-15 DIAGNOSIS — K603 Anal fistula: Secondary | ICD-10-CM | POA: Diagnosis not present

## 2017-12-15 NOTE — H&P (Signed)
Carl Woods Documented: 12/15/2017 9:55 AM Location: [facility] Patient #: [id number] DOB: 08-02-1931 Married / Language: Carl Woods / Race: White Male  History of Present Illness Carl Hector MD; 12/15/2017 10:37 AM) The patient is a 84 year old male who presents with anal fistula. Note for "Anal fistula": ` ` ` Patient sent for surgical consultation at the request of Dr Carlean Purl & [facility]  Chief Complaint: Perianal rash. Probable anal fistula ` ` The patient is a elderly gentleman whose had some perianal leakage and drainage. Was sent to dermatology. Treated with topical agents without much improvement. Concern for anatomical abnormality such as fistula discussed. Had colonoscopy in 2018 that had some polyps but no major anorectal issues. Had recent MRI suspicious for a small fistula in the left aspect. Surgical consultation requested.   Patient comes in today by himself. He notes he's not having rashes much anymore but does have some chronic drainage. Denies any history of prior anorectal interventions. No major hemorrhoidal issues. Moves his bowels usually twice a day. He does have chronic atrial fibrillation. He has been on Eliquis anticoagulation for many years. Followed by Dr. Harriet Pho at first but Dr. Donnetta Simpers pretty much takes care of most of his medical health. He hasn't smoked in 13 years. He can walk about a half mile before he has to stop feeling tired. No major exertional chest pain.   No personal nor family history of GI/colon cancer, inflammatory bowel disease, irritable bowel syndrome, allergy such as Celiac Sprue, dietary/dairy problems, colitis, ulcers nor gastritis. No recent sick contacts/gastroenteritis. No travel outside the country. No changes in diet. No dysphagia to solids or liquids. No significant heartburn or reflux. No hematochezia, hematemesis, coffee ground emesis. No evidence of prior gastric/peptic  ulceration.  (Review of systems as stated in this history (HPI) or in the review of systems. Otherwise all other 12 point ROS are negative) ` ` `  CLINICAL DATA: Chronic perianal rash and drainage. Clinical concern for perianal fistula.  EXAM: MRI PELVIS WITHOUT AND WITH CONTRAST  TECHNIQUE: Multiplanar multisequence MR imaging of the pelvis was performed both before and after administration of intravenous contrast.  Creatinine was obtained on site at [facility] at [address]  Results: Creatinine 1.5 mg/dL.  CONTRAST: 26mL MULTIHANCE GADOBENATE DIMEGLUMINE 529 MG/ML IV SOLN  COMPARISON: None.  FINDINGS: Urinary Tract: Visualized bladder and urethra are unremarkable.  Bowel: There is a simple linear intersphincteric perianal fistula ([place] grade 1) in the 5 o'clock location with inferior extension to the cutaneous surface of the medial left gluteal fold (fistula tract measures approximately 3 cm in length), with associated enhancement of the fistula wall. No associated abscess. No additional perianal or perirectal fistula. No rectal wall thickening.  Vascular/Lymphatic: No acute vascular abnormality. No pathologically enlarged lymph nodes in the visualized pelvis.  Reproductive: Normal size prostate.  Other: No pelvic fluid collections.  Musculoskeletal: No aggressive appearing focal osseous lesions.  IMPRESSION: Simple linear intersphincteric Shelton Silvas grade 1) perianal fistula in the 5 o'clock location with inferior extension to the cutaneous surface of the medial left gluteal fold. No abscess.   Electronically Signed By: Ilona Sorrel M.D. On: 11/23/2017 08:44   Allergies [facility], RMA; 12/15/2017 9:55 AM) No Known Drug Allergies [12/15/2017]: Allergies Reconciled  Medication History [employer], RMA; 12/15/2017 9:58 AM) Nystatin-Triamcinolone (100000-0.1UNIT/GM-% Ointment, External)  Active. Eliquis (5MG  Tablet, Oral) Active. Aspirin (81MG  Tablet DR, Oral) Active. Vitamin D (Oral) Specific strength unknown -  Active. Glucosamine (Oral) Specific strength unknown - Active. Olopatadine HCl (Ophthalmic) Specific strength unknown - Active. Hydrocortisone (2.5% Cream, Rectal) Active. Omeprazole (20MG  Capsule DR, Oral) Active. Levothyroxine Sodium (112MCG Tablet, Oral) Active. Losartan Potassium (25MG  Tablet, Oral) Active. Furosemide (20MG  Tablet, Oral) Active. Medications Reconciled    Vitals (Jacqueline Haggett RMA; 12/15/2017 9:59 AM) 12/15/2017 9:58 AM Weight: 247.4 lb Height: 68in Body Surface Area: 2.24 m Body Mass Index: 37.62 kg/m  Temp.: 96.2F(Temporal)  Pulse: 85 (Regular)  BP: 140/58 (Sitting, Left Arm, Standard)      Physical Exam Carl Hector MD; 12/15/2017 10:22 AM)  General Mental Status-Alert. General Appearance-Not in acute distress, Not Sickly. Orientation-Oriented X3. Hydration-Well hydrated. Voice-Normal.  Integumentary Global Assessment Upon inspection and palpation of skin surfaces of the - Axillae: non-tender, no inflammation or ulceration, no drainage. and Distribution of scalp and body hair is normal. General Characteristics Temperature - normal warmth is noted.  Head and Neck Head-normocephalic, atraumatic with no lesions or palpable masses. Face Global Assessment - atraumatic, no absence of expression. Neck Global Assessment - no abnormal movements, no bruit auscultated on the right, no bruit auscultated on the left, no decreased range of motion, non-tender. Trachea-midline. Thyroid Gland Characteristics - non-tender.  Eye Eyeball - Left-Extraocular movements intact, No Nystagmus. Eyeball - Right-Extraocular movements intact, No Nystagmus. Cornea - Left-No Hazy. Cornea - Right-No Hazy. Sclera/Conjunctiva - Left-No scleral icterus, No Discharge. Sclera/Conjunctiva -  Right-No scleral icterus, No Discharge. Pupil - Left-Direct reaction to light normal. Pupil - Right-Direct reaction to light normal. Note: Wears glasses. Vision corrected  ENMT Ears Pinna - Left - no drainage observed, no generalized tenderness observed. Right - no drainage observed, no generalized tenderness observed. Nose and Sinuses External Inspection of the Nose - no destructive lesion observed. Inspection of the nares - Left - quiet respiration. Right - quiet respiration. Mouth and Throat Lips - Upper Lip - no fissures observed, no pallor noted. Lower Lip - no fissures observed, no pallor noted. Nasopharynx - no discharge present. Oral Cavity/Oropharynx - Tongue - no dryness observed. Oral Mucosa - no cyanosis observed. Hypopharynx - no evidence of airway distress observed.  Chest and Lung Exam Inspection Movements - Normal and Symmetrical. Accessory muscles - No use of accessory muscles in breathing. Palpation Palpation of the chest reveals - Non-tender. Auscultation Breath sounds - Normal and Clear.  Cardiovascular Auscultation Rhythm - Regular. Murmurs & Other Heart Sounds - Auscultation of the heart reveals - No Murmurs and No Systolic Clicks.  Abdomen Inspection Inspection of the abdomen reveals - No Visible peristalsis and No Abnormal pulsations. Umbilicus - No Bleeding, No Urine drainage. Palpation/Percussion Palpation and Percussion of the abdomen reveal - Soft, Non Tender, No Rebound tenderness, No Rigidity (guarding) and No Cutaneous hyperesthesia. Note: Morbid obese with soft with mild diastases recti. Small umbilical hernia easily reduces. Less than a centimeter defect. Low midline incision well healed without hernia No guarding.  Male Genitourinary Sexual Maturity Tanner 5 - Adult hair pattern and Adult penile size and shape. Note: No inguinal hernias. Normal external genitalia. Epididymi, testes, and spermatic cords normal without any  masses.  Rectal Note: Please refer to anoscopy section.  Left lateral opening sinus with cord tracking towards anus consistent with anal fistula.  Peripheral Vascular Upper Extremity Inspection - Left - No Cyanotic nailbeds, Not Ischemic. Right - No Cyanotic nailbeds, Not Ischemic.  Neurologic Neurologic evaluation reveals -normal attention span and ability to concentrate, able to name objects and repeat phrases. Appropriate fund of knowledge ,  normal sensation and normal coordination. Mental Status Affect - not angry, not paranoid. Cranial Nerves-Normal Bilaterally. Gait-Normal.  Neuropsychiatric Mental status exam performed with findings of-able to articulate well with normal speech/language, rate, volume and coherence, thought content normal with ability to perform basic computations and apply abstract reasoning and no evidence of hallucinations, delusions, obsessions or homicidal/suicidal ideation. Note: Has some delayed response in answers but otherwise very sharp with good memory recall and understanding of his personal history  Musculoskeletal Global Assessment Spine, Ribs and Pelvis - no instability, subluxation or laxity. Right Upper Extremity - no instability, subluxation or laxity.  Lymphatic Head & Neck  General Head & Neck Lymphatics: Bilateral - Description - No Localized lymphadenopathy. Axillary  General Axillary Region: Bilateral - Description - No Localized lymphadenopathy. Femoral & Inguinal  Generalized Femoral & Inguinal Lymphatics: Left - Description - No Localized lymphadenopathy. Right - Description - No Localized lymphadenopathy.   Results Carl Hector MD; 12/15/2017 10:37 AM) Procedures  Name Value Date Hemorrhoids Procedure Internal exam: Internal Hemorroids ( non-bleeding) Other: Perianal skin with minimal pruritus. Left posterolateral sinus opening about 2.5 cm from anal verge. I can feel a SQ cord going towards the  posterior midline sphincter complex. c/w anal fistula...........Marland KitchenTolerate digital or anoscopic exam. Grade 1-2 internal hemorrhoids especially right posterior. No fissure. No definite internal sinus noted but suspected and posterior midline anal crypt. Prostate mildly enlarged but smooth. No rectal masses felt 26 cm by digital exam. No condyloma. No pilonidal disease.  Performed: 12/15/2017 10:25 AM    Assessment & Plan Carl Hector MD; 12/15/2017 10:37 AM)  ANAL FISTULA (K60.3) Impression: History physical an MRI very suspicious for anal fistula.  I think this requires examination under anesthesia with either fistulotomy if superficial versus left or other repair if more deep/transfer enteric/complex.  He has decent exercise tolerance for 84 year old but is fully anticoagulated. I think it will be good idea to get cardiac clearance to hedge against anesthesias concerns. Reach out to Dr. Einar Gip and see if he has further insights. Still should be an outpatient surgery.  Current Plans Pt Education - [facility] Abscess/Fistula (AT): discussed with patient and provided information. The anatomy & physiology of the anorectal region was discussed. We discussed the pathophysiology of anorectal abscess and fistula. Differential diagnosis was discussed. Natural history progression was discussed. I stressed the importance of a bowel regimen to have daily soft bowel movements to minimize progression of disease.  The patient's condition is not adequately controlled. Non-operative treatment has not healed the fistula. Therefore, I recommended examination under anaesthesia to confirm the diagnosis and treat the fistula. I discussed techniques that may be required such as fistulotomy, ligation by LIFT technique, and/or seton placement. Benefits & alternatives discussed. I noted a good likelihood this will help address the problem, but sometimes repeat operations and prolonged healing times may  occur. Risks such as bleeding, pain, recurrence, reoperation, incontinence, heart attack, death, and other risks were discussed.  Educational handouts further explaining the pathology, treatment options, and bowel regimen were given. The patient expressed understanding & wishes to proceed. We will work to coordinate surgery for a mutually convenient time.  ANOSCOPY, DIAGNOSTIC (51761) You are being scheduled for surgery- Our schedulers will call you.  You should hear from our office's scheduling department within 5 working days about the location, date, and time of surgery. We try to make accommodations for patient's preferences in scheduling surgery, but sometimes the OR schedule or the surgeon's schedule prevents [place] from making those  accommodations.  If you have not heard from our office [phone]) in 5 working days, call the office and ask for your surgeon's nurse.  If you have other questions about your diagnosis, plan, or surgery, call the office and ask for your surgeon's nurse.   ENCOUNTER FOR PREOPERATIVE EXAMINATION FOR GENERAL SURGICAL PROCEDURE (Z01.818)  Current Plans Pt Education - [facility] Rectal Prep for Anorectal outpatient/office surgery: discussed with patient and provided information. Pt Education - [facility] Rectal Surgery HCI (Zaia Carre): discussed with patient and provided information. Pt Education - [place] (Risha Barretta)  ANTICOAGULATED (Z79.01)  Current Plans I recommended obtaining preoperative cardiac clearance. I am concerned about the health of the patient and the ability to tolerate the operation. Therefore, we will request clearance by cardiology to better assess operative risk & see if a reevaluation, further workup, etc is needed. Also recommendations on how medications such as for anticoagulation and blood pressure should be managed/held/restarted after surgery. Pt Education - [facility] Hold anticoagulation preoperatively  Carl Hector, MD, FACS,  [facility]    [address] [place], [place] [address] [phone] Main / Paging [phone] Fax

## 2017-12-18 DIAGNOSIS — R7303 Prediabetes: Secondary | ICD-10-CM | POA: Diagnosis not present

## 2017-12-18 DIAGNOSIS — Z01818 Encounter for other preprocedural examination: Secondary | ICD-10-CM | POA: Diagnosis not present

## 2017-12-18 DIAGNOSIS — I480 Paroxysmal atrial fibrillation: Secondary | ICD-10-CM | POA: Diagnosis not present

## 2017-12-18 DIAGNOSIS — E039 Hypothyroidism, unspecified: Secondary | ICD-10-CM | POA: Diagnosis not present

## 2017-12-22 DIAGNOSIS — I10 Essential (primary) hypertension: Secondary | ICD-10-CM | POA: Diagnosis not present

## 2017-12-22 DIAGNOSIS — I480 Paroxysmal atrial fibrillation: Secondary | ICD-10-CM | POA: Diagnosis not present

## 2017-12-22 DIAGNOSIS — Z01818 Encounter for other preprocedural examination: Secondary | ICD-10-CM | POA: Diagnosis not present

## 2017-12-22 DIAGNOSIS — K649 Unspecified hemorrhoids: Secondary | ICD-10-CM | POA: Diagnosis not present

## 2017-12-22 DIAGNOSIS — J449 Chronic obstructive pulmonary disease, unspecified: Secondary | ICD-10-CM | POA: Diagnosis not present

## 2018-01-05 DIAGNOSIS — I480 Paroxysmal atrial fibrillation: Secondary | ICD-10-CM | POA: Diagnosis not present

## 2018-01-05 DIAGNOSIS — N183 Chronic kidney disease, stage 3 (moderate): Secondary | ICD-10-CM | POA: Diagnosis not present

## 2018-01-05 DIAGNOSIS — R7303 Prediabetes: Secondary | ICD-10-CM | POA: Diagnosis not present

## 2018-01-05 DIAGNOSIS — I10 Essential (primary) hypertension: Secondary | ICD-10-CM | POA: Diagnosis not present

## 2018-01-05 DIAGNOSIS — E039 Hypothyroidism, unspecified: Secondary | ICD-10-CM | POA: Diagnosis not present

## 2018-01-05 DIAGNOSIS — D500 Iron deficiency anemia secondary to blood loss (chronic): Secondary | ICD-10-CM | POA: Diagnosis not present

## 2018-01-06 DIAGNOSIS — Z01810 Encounter for preprocedural cardiovascular examination: Secondary | ICD-10-CM | POA: Diagnosis not present

## 2018-01-06 DIAGNOSIS — I4821 Permanent atrial fibrillation: Secondary | ICD-10-CM | POA: Diagnosis not present

## 2018-01-06 DIAGNOSIS — I10 Essential (primary) hypertension: Secondary | ICD-10-CM | POA: Diagnosis not present

## 2018-01-06 DIAGNOSIS — J432 Centrilobular emphysema: Secondary | ICD-10-CM | POA: Diagnosis not present

## 2018-02-19 ENCOUNTER — Encounter (HOSPITAL_COMMUNITY): Payer: Self-pay

## 2018-02-19 NOTE — Patient Instructions (Addendum)
Your procedure is scheduled on: Tuesday, Feb. 4, 2020   Surgery Time:  7:30AM-9:00AM   Report to [facility]  Entrance    Report to admitting at 5:30 AM   Call this number if you have problems the morning of surgery [phone]   Bring CPAP mask and tubing day of surgery   Obtain a bottle of Milk of Magnesia at a [place]:   Switch to drinking liquids or pureed foods only  Drink plenty of liquids all day to avoid getting dehydrated   10:00am: Take 2 oz (4 tablespoons) Milk of Magnesia.   2:00pm: Take 2 oz (4 tablespoons) Milk of Magnesia.   Midnight: Do not eat anything solid after bedtime (midnight) the night before your surgery.    BUT DO drink plenty of clear liquids (Water, Gatorade, juice, soda, coffee, tea, broths, etc.) up to 3 hours prior to surgery to avoid getting dehydrated.    MORNING OF SURGERY   Remember to not to eat anything solid that morning   Hold or take medications as recommended by the hospital staff at your Preoperative visit   Stop drinking liquids before you leave the house (>3 hours prior to surgery) at 4:30AM  [place] Excluded  Water, Black Coffee and tea, regular and decaf                             liquids that you cannot  Plain Jell-O in any flavor                                             see through such as: Fruit ices (not with fruit pulp)                                     milk, soups, orange juice  Iced Popsicles                                    All solid food Carbonated beverages, regular and diet                                    Cranberry, grape and apple juices Sports drinks like Gatorade Lightly seasoned clear broth or consume(fat free) Sugar, honey syrup  Sample Menu Breakfast                                Lunch  Supper Cranberry juice                     Beef broth                            Chicken broth Jell-O                                     Grape juice                           Apple juice Coffee or tea                        Jell-O                                      Popsicle                                                Coffee or tea                        Coffee or tea   Brush your teeth the morning of surgery.   Do NOT smoke after Midnight   Complete one Ensure drink the morning of surgery at 4:30AM the day of surgery.   Take these medicines the morning of surgery with A SIP OF WATER: Levothyroxine, Omeprazole                               You may not have any metal on your body including jewelry, and body piercings             Do not wear lotions, powders, perfumes/cologne, or deodorant                          Men may shave face and neck.   Do not bring valuables to the hospital. [place].   Contacts, dentures or bridgework may not be worn into surgery.    Patients discharged the day of surgery will not be allowed to drive home.   Special Instructions: Bring a copy of your healthcare power of attorney and living will documents         the day of surgery if you haven't scanned them in before.              Please read over the following fact sheets you were given:  [facility] - Preparing for Surgery Before surgery, you can play an important role.  Because skin is not sterile, your skin needs to be as free of germs as possible.  You can reduce the number of germs on your skin by washing with CHG (chlorahexidine gluconate) soap before surgery.  CHG is an antiseptic cleaner which kills germs and bonds with the skin to continue killing germs even after washing. Please DO NOT use if  you have an allergy to CHG or antibacterial soaps.  If your skin becomes reddened/irritated stop using the CHG and inform your nurse when you arrive at Short Stay. Do not shave  (including legs and underarms) for at least 48 hours prior to the first CHG shower.  You may shave your face/neck.  Please follow these instructions carefully:  1.  Shower with CHG Soap the night before surgery and the  morning of surgery.  2.  If you choose to wash your hair, wash your hair first as usual with your normal  shampoo.  3.  After you shampoo, rinse your hair and body thoroughly to remove the shampoo.                             4.  Use CHG as you would any other liquid soap.  You can apply chg directly to the skin and wash.  Gently with a scrungie or clean washcloth.  5.  Apply the CHG Soap to your body ONLY FROM THE NECK DOWN.   Do   not use on face/ open                           Wound or open sores. Avoid contact with eyes, ears mouth and   genitals (private parts).                       Wash face,  Genitals (private parts) with your normal soap.             6.  Wash thoroughly, paying special attention to the area where your    surgery  will be performed.  7.  Thoroughly rinse your body with warm water from the neck down.  8.  DO NOT shower/wash with your normal soap after using and rinsing off the CHG Soap.                9.  Pat yourself dry with a clean towel.            10.  Wear clean pajamas.            11.  Place clean sheets on your bed the night of your first shower and do not  sleep with pets. Day of Surgery : Do not apply any lotions/deodorants the morning of surgery.  Please wear clean clothes to the hospital/surgery center.  FAILURE TO FOLLOW THESE INSTRUCTIONS MAY RESULT IN THE CANCELLATION OF YOUR SURGERY  PATIENT SIGNATURE_________________________________  NURSE SIGNATURE__________________________________  ________________________________________________________________________

## 2018-02-23 ENCOUNTER — Encounter (HOSPITAL_COMMUNITY)
Admission: RE | Admit: 2018-02-23 | Discharge: 2018-02-23 | Disposition: A | Discharge status: Home / Self Care | Payer: PPO | Source: Ambulatory Visit | Attending: Surgery | Admitting: Surgery

## 2018-02-23 ENCOUNTER — Other Ambulatory Visit: Payer: Self-pay

## 2018-02-23 ENCOUNTER — Encounter (HOSPITAL_COMMUNITY): Payer: Self-pay

## 2018-02-23 DIAGNOSIS — K603 Anal fistula: Secondary | ICD-10-CM | POA: Diagnosis not present

## 2018-02-23 DIAGNOSIS — Z79899 Other long term (current) drug therapy: Secondary | ICD-10-CM | POA: Insufficient documentation

## 2018-02-23 DIAGNOSIS — Z7951 Long term (current) use of inhaled steroids: Secondary | ICD-10-CM | POA: Diagnosis not present

## 2018-02-23 DIAGNOSIS — Z87891 Personal history of nicotine dependence: Secondary | ICD-10-CM | POA: Diagnosis not present

## 2018-02-23 DIAGNOSIS — E039 Hypothyroidism, unspecified: Secondary | ICD-10-CM | POA: Insufficient documentation

## 2018-02-23 DIAGNOSIS — I4891 Unspecified atrial fibrillation: Secondary | ICD-10-CM | POA: Diagnosis not present

## 2018-02-23 DIAGNOSIS — Z7901 Long term (current) use of anticoagulants: Secondary | ICD-10-CM | POA: Insufficient documentation

## 2018-02-23 DIAGNOSIS — K219 Gastro-esophageal reflux disease without esophagitis: Secondary | ICD-10-CM | POA: Diagnosis not present

## 2018-02-23 DIAGNOSIS — Z01818 Encounter for other preprocedural examination: Secondary | ICD-10-CM | POA: Diagnosis not present

## 2018-02-23 DIAGNOSIS — Z79890 Hormone replacement therapy (postmenopausal): Secondary | ICD-10-CM | POA: Insufficient documentation

## 2018-02-23 DIAGNOSIS — K760 Fatty (change of) liver, not elsewhere classified: Secondary | ICD-10-CM | POA: Diagnosis not present

## 2018-02-23 DIAGNOSIS — G4730 Sleep apnea, unspecified: Secondary | ICD-10-CM | POA: Insufficient documentation

## 2018-02-23 DIAGNOSIS — M1990 Unspecified osteoarthritis, unspecified site: Secondary | ICD-10-CM | POA: Insufficient documentation

## 2018-02-23 DIAGNOSIS — N400 Enlarged prostate without lower urinary tract symptoms: Secondary | ICD-10-CM | POA: Insufficient documentation

## 2018-02-23 HISTORY — DX: Anal fistula: K60.3

## 2018-02-23 HISTORY — DX: Personal history of colon polyps, unspecified: Z86.0100

## 2018-02-23 HISTORY — DX: Arteriovenous malformation, site unspecified: Q27.30

## 2018-02-23 HISTORY — DX: Gastro-esophageal reflux disease without esophagitis: K21.9

## 2018-02-23 HISTORY — DX: Deviated nasal septum: J34.2

## 2018-02-23 HISTORY — DX: Anal fistula, unspecified: K60.30

## 2018-02-23 HISTORY — DX: Epistaxis: R04.0

## 2018-02-23 HISTORY — DX: Other bacterial infections of unspecified site: A49.8

## 2018-02-23 HISTORY — DX: Unspecified cataract: H26.9

## 2018-02-23 HISTORY — DX: Obstructive sleep apnea (adult) (pediatric): G47.33

## 2018-02-23 HISTORY — DX: Diverticulosis of large intestine without perforation or abscess without bleeding: K57.30

## 2018-02-23 HISTORY — DX: Dependence on other enabling machines and devices: Z99.89

## 2018-02-23 HISTORY — DX: Personal history of colonic polyps: Z86.010

## 2018-02-23 HISTORY — DX: Rash and other nonspecific skin eruption: R21

## 2018-02-23 HISTORY — DX: Cyst of kidney, acquired: N28.1

## 2018-02-23 HISTORY — DX: Other seasonal allergic rhinitis: J30.2

## 2018-02-23 HISTORY — DX: Unspecified osteoarthritis, unspecified site: M19.90

## 2018-02-23 HISTORY — DX: Fatty (change of) liver, not elsewhere classified: K76.0

## 2018-02-23 LAB — CBC
HCT: 36.3 % — ABNORMAL LOW (ref 39.0–52.0)
Hemoglobin: 11.6 g/dL — ABNORMAL LOW (ref 13.0–17.0)
MCH: 32.6 pg (ref 26.0–34.0)
MCHC: 32 g/dL (ref 30.0–36.0)
MCV: 102 fL — ABNORMAL HIGH (ref 80.0–100.0)
Platelets: 160 10*3/uL (ref 150–400)
RBC: 3.56 MIL/uL — ABNORMAL LOW (ref 4.22–5.81)
RDW: 14 % (ref 11.5–15.5)
WBC: 7.1 10*3/uL (ref 4.0–10.5)
nRBC: 0 % (ref 0.0–0.2)

## 2018-02-23 LAB — BASIC METABOLIC PANEL
Anion gap: 8 (ref 5–15)
BUN: 25 mg/dL — ABNORMAL HIGH (ref 8–23)
CO2: 24 mmol/L (ref 22–32)
Calcium: 9.2 mg/dL (ref 8.9–10.3)
Chloride: 108 mmol/L (ref 98–111)
Creatinine, Ser: 1.36 mg/dL — ABNORMAL HIGH (ref 0.61–1.24)
GFR calc Af Amer: 55 mL/min — ABNORMAL LOW (ref >60)
GFR calc non Af Amer: 47 mL/min — ABNORMAL LOW (ref >60)
Glucose, Bld: 129 mg/dL — ABNORMAL HIGH (ref 70–99)
Potassium: 3.9 mmol/L (ref 3.5–5.1)
Sodium: 140 mmol/L (ref 135–145)

## 2018-02-23 NOTE — Progress Notes (Signed)
Cardiac clearance from Dr. Einar Gip 01/28/2018 in hard chart.

## 2018-02-24 NOTE — Pre-Procedure Instructions (Addendum)
EKG 12/22/2017 in hard chart  Chart sent to [facility].A. for review Carl Woods has history of Afib and OSA.

## 2018-02-24 NOTE — Progress Notes (Signed)
Anesthesia Chart Review   Case:  [id number] Date/Time:  03/02/18 0715   Procedure:  ANORECTAL EXAM UNDER ANESTHESIA WITH REPAIR OF PERIRECTAL FISTULA ERAS PATHWAY POSSIBLE HEMORRHOIDECTOMY (N/A )   Anesthesia type:  General   Pre-op diagnosis:  Anal fistula   Location:  [facility] ROOM 04 / [facility]   Surgeon:  Michael Boston, MD      DISCUSSION: 85 yo former smoker (quit 01/28/04) with h/o A-fib, BPH, hypothyroidism, barrett's esophagus, OSA on CPAP, GERD, anal fistula scheduled for above surgery on 03/02/2018 with Dr. Michael Boston.   Cardiac clearance received from Binnie Kand, NP on 01/28/2018 (on chart).  Per clearance pt is low risk for perioperative CV complications, asymptomatic with fair functional capacity.  Last seen by Dr. Adrian Prows on 01/06/18.  He is advised to hold Eliquis 2 days prior to surgery and resume ASAP when safe from surgery standpoint.    Pt can proceed with planned procedure barring acute status change.  VS: BP (!) 125/96   Pulse (!) 58   Temp 36.7 C (Oral)   Resp 16   Ht 5\' 8"  (1.727 m)   Wt 110.2 kg   SpO2 97%   BMI 36.95 kg/m   PROVIDERS: Merrilee Seashore, MD is PCP   Adrian Prows, MD is Cardiologist  LABS: Labs reviewed: Acceptable for surgery. (all labs ordered are listed, but only abnormal results are displayed)  Labs Reviewed  BASIC METABOLIC PANEL - Abnormal; Notable for the following components:      Result Value   Glucose, Bld 129 (*)    BUN 25 (*)    Creatinine, Ser 1.36 (*)    GFR calc non Af Amer 47 (*)    GFR calc Af Amer 55 (*)    All other components within normal limits  CBC - Abnormal; Notable for the following components:   RBC 3.56 (*)    Hemoglobin 11.6 (*)    HCT 36.3 (*)    MCV 102.0 (*)    All other components within normal limits     IMAGES:   EKG: 12/22/17 (on chart) Rate 60bpm Atrial fibrillation  Low voltage-possible pulmonary disease Unchanged from prior  CV:  Past Medical History:  Diagnosis Date  . Anal fistula    . Arthritis    Fingers and hands  . Atrial fibrillation ([place])   . AVM (arteriovenous malformation)    Clipped during Colonoscopy 05/2016  . Barrett's esophagus   . Bilateral cataracts   . BPH (benign prostatic hyperplasia)   . 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 Deviated nasal septum   . Diverticulosis of sigmoid colon   . E. coli infection   . Fatty liver   . GERD (gastroesophageal reflux disease)   . History of colon polyps   . Hypothyroidism   . Iron deficiency anemia   . OSA on CPAP   . Perianal rash   . Recurrent epistaxis   . Renal cyst 01/04/2013   Small left peripelvic renal cysts , noted on US Renal  . Seasonal allergies   . Tubular adenoma     Past Surgical History:  Procedure Laterality Date  . COLONOSCOPY  multiple  . CYSTOSCOPY    . ESOPHAGOGASTRODUODENOSCOPY  multiple  . EXPLORATORY LAPAROTOMY      MEDICATIONS: . apixaban (ELIQUIS) 5 MG TABS tablet  . Cholecalciferol (VITAMIN D3) 5000 units CAPS  . fexofenadine (ALLEGRA ALLERGY) 180 MG tablet  . fluticasone (FLONASE) 50 MCG/ACT nasal spray  .  furosemide (LASIX) 20 MG tablet  . Glucosamine-Chondroit-Vit C-Mn (GLUCOSAMINE CHONDR 500 COMPLEX PO)  . hydrocortisone (PROCTOSOL HC) 2.5 % rectal cream  . levothyroxine (SYNTHROID, LEVOTHROID) 112 MCG tablet  . losartan (COZAAR) 25 MG tablet  . nystatin-triamcinolone ointment (MYCOLOG)  . olopatadine (PATANOL) 0.1 % ophthalmic solution  . omeprazole (PRILOSEC) 20 MG capsule  . sodium chloride (OCEAN) 0.65 % SOLN nasal spray   No current facility-administered medications for this encounter.      Maia Plan [facility] Pre-Surgical Testing [phone] 02/24/18 2:48 PM

## 2018-02-24 NOTE — Anesthesia Preprocedure Evaluation (Addendum)
Anesthesia Evaluation  Patient identified by MRN, date of birth, ID band Patient awake    Reviewed: Allergy & Precautions, NPO status , Patient's Chart, lab work & pertinent test results  History of Anesthesia Complications Negative for: history of anesthetic complications  Airway Mallampati: III       Dental  (+) Missing, Partial Upper   Pulmonary sleep apnea and Continuous Positive Airway Pressure Ventilation , former smoker,    breath sounds clear to auscultation       Cardiovascular hypertension, Pt. on medications + dysrhythmias Atrial Fibrillation  Rhythm:Irregular Rate:Bradycardia     Neuro/Psych negative neurological ROS  negative psych ROS   GI/Hepatic Neg liver ROS, GERD  Medicated,  Endo/Other  Hypothyroidism   Renal/GU Renal InsufficiencyRenal disease  negative genitourinary   Musculoskeletal negative musculoskeletal ROS (+)   Abdominal   Peds  Hematology  (+) anemia ,   Anesthesia Other Findings 85 yo M for EUA & repair perirectal fistula - PMH: OSA on CPAP, A fib on Eliquis, hypothyroid, GERD, CKD (Cr 1.36), anemia (Hgb 11.6) - Cardiac clearance received from Binnie Kand, NP on 01/28/2018 (on chart).  Per clearance pt is low risk for perioperative CV complications, asymptomatic with fair functional capacity.  Last seen by Dr. Adrian Prows on 01/06/18.  Reproductive/Obstetrics                           Anesthesia Physical Anesthesia Plan  ASA: III  Anesthesia Plan: General   Post-op Pain Management:    Induction: Intravenous  PONV Risk Score and Plan: 2 and Ondansetron, Dexamethasone and Treatment may vary due to age or medical condition  Airway Management Planned: Oral ETT  Additional Equipment: None  Intra-op Plan:   Post-operative Plan: Extubation in OR  Informed Consent: I have reviewed the patients History and Physical, chart, labs and discussed the procedure  including the risks, benefits and alternatives for the proposed anesthesia with the patient or authorized representative who has indicated his/her understanding and acceptance.     Dental advisory given  Plan Discussed with:   Anesthesia Plan Comments: (See PST note 02/23/2018, Konrad Felix, PA-C)      Anesthesia Quick Evaluation

## 2018-03-01 MED ORDER — BUPIVACAINE LIPOSOME 1.3 % IJ SUSP
20.0000 mL | Freq: Once | INTRAMUSCULAR | Status: DC
  Filled 2018-03-01: qty 20

## 2018-03-02 ENCOUNTER — Other Ambulatory Visit: Payer: Self-pay

## 2018-03-02 ENCOUNTER — Ambulatory Visit (HOSPITAL_COMMUNITY): Payer: PPO | Admitting: Physician Assistant

## 2018-03-02 ENCOUNTER — Ambulatory Visit (HOSPITAL_COMMUNITY)
Admission: RE | Admit: 2018-03-02 | Discharge: 2018-03-02 | Disposition: A | Discharge status: Home / Self Care | Payer: PPO | Attending: Surgery | Admitting: Surgery

## 2018-03-02 ENCOUNTER — Encounter (HOSPITAL_COMMUNITY)
Admission: RE | Disposition: A | Discharge status: Home / Self Care | Payer: Self-pay | Source: Home / Self Care | Attending: Surgery

## 2018-03-02 ENCOUNTER — Ambulatory Visit (HOSPITAL_COMMUNITY): Payer: PPO | Admitting: Anesthesiology

## 2018-03-02 ENCOUNTER — Encounter (HOSPITAL_COMMUNITY): Payer: Self-pay | Admitting: *Deleted

## 2018-03-02 DIAGNOSIS — Z87891 Personal history of nicotine dependence: Secondary | ICD-10-CM | POA: Insufficient documentation

## 2018-03-02 DIAGNOSIS — N189 Chronic kidney disease, unspecified: Secondary | ICD-10-CM | POA: Insufficient documentation

## 2018-03-02 DIAGNOSIS — K643 Fourth degree hemorrhoids: Secondary | ICD-10-CM | POA: Insufficient documentation

## 2018-03-02 DIAGNOSIS — Z79890 Hormone replacement therapy (postmenopausal): Secondary | ICD-10-CM | POA: Insufficient documentation

## 2018-03-02 DIAGNOSIS — Z79899 Other long term (current) drug therapy: Secondary | ICD-10-CM | POA: Insufficient documentation

## 2018-03-02 DIAGNOSIS — Z7982 Long term (current) use of aspirin: Secondary | ICD-10-CM | POA: Diagnosis not present

## 2018-03-02 DIAGNOSIS — K648 Other hemorrhoids: Secondary | ICD-10-CM | POA: Diagnosis not present

## 2018-03-02 DIAGNOSIS — K219 Gastro-esophageal reflux disease without esophagitis: Secondary | ICD-10-CM | POA: Diagnosis not present

## 2018-03-02 DIAGNOSIS — I482 Chronic atrial fibrillation, unspecified: Secondary | ICD-10-CM | POA: Diagnosis not present

## 2018-03-02 DIAGNOSIS — E039 Hypothyroidism, unspecified: Secondary | ICD-10-CM | POA: Insufficient documentation

## 2018-03-02 DIAGNOSIS — K641 Second degree hemorrhoids: Secondary | ICD-10-CM | POA: Insufficient documentation

## 2018-03-02 DIAGNOSIS — Z7901 Long term (current) use of anticoagulants: Secondary | ICD-10-CM

## 2018-03-02 DIAGNOSIS — I4891 Unspecified atrial fibrillation: Secondary | ICD-10-CM | POA: Diagnosis not present

## 2018-03-02 DIAGNOSIS — G4733 Obstructive sleep apnea (adult) (pediatric): Secondary | ICD-10-CM | POA: Diagnosis not present

## 2018-03-02 DIAGNOSIS — G4730 Sleep apnea, unspecified: Secondary | ICD-10-CM | POA: Diagnosis not present

## 2018-03-02 DIAGNOSIS — K603 Anal fistula, unspecified: Secondary | ICD-10-CM

## 2018-03-02 DIAGNOSIS — I129 Hypertensive chronic kidney disease with stage 1 through stage 4 chronic kidney disease, or unspecified chronic kidney disease: Secondary | ICD-10-CM | POA: Diagnosis not present

## 2018-03-02 HISTORY — PX: EVALUATION UNDER ANESTHESIA WITH FISTULECTOMY: SHX5623

## 2018-03-02 SURGERY — EXAM UNDER ANESTHESIA WITH FISTULECTOMY
Anesthesia: General | Site: Rectum

## 2018-03-02 MED ORDER — ONDANSETRON HCL 4 MG/2ML IJ SOLN
INTRAMUSCULAR | Status: DC | PRN
  Administered 2018-03-02: 4 mg via INTRAVENOUS

## 2018-03-02 MED ORDER — ROCURONIUM BROMIDE 10 MG/ML (PF) SYRINGE
PREFILLED_SYRINGE | INTRAVENOUS | Status: DC | PRN
  Administered 2018-03-02: 60 mg via INTRAVENOUS

## 2018-03-02 MED ORDER — EPHEDRINE 5 MG/ML INJ
INTRAVENOUS | Status: AC
  Filled 2018-03-02: qty 10

## 2018-03-02 MED ORDER — SODIUM CHLORIDE 0.9 % IV SOLN
2.0000 g | INTRAVENOUS | Status: AC
  Administered 2018-03-02: 2 g via INTRAVENOUS
  Filled 2018-03-02: qty 20

## 2018-03-02 MED ORDER — LIDOCAINE HCL 2 % IJ SOLN
INTRAMUSCULAR | Status: AC
  Filled 2018-03-02: qty 40

## 2018-03-02 MED ORDER — DIBUCAINE 1 % RE OINT
TOPICAL_OINTMENT | RECTAL | Status: DC | PRN
  Administered 2018-03-02: 1 via RECTAL

## 2018-03-02 MED ORDER — GABAPENTIN 300 MG PO CAPS
300.0000 mg | ORAL_CAPSULE | ORAL | Status: AC
  Administered 2018-03-02: 300 mg via ORAL
  Filled 2018-03-02: qty 1

## 2018-03-02 MED ORDER — EPHEDRINE SULFATE-NACL 50-0.9 MG/10ML-% IV SOSY
PREFILLED_SYRINGE | INTRAVENOUS | Status: DC | PRN
  Administered 2018-03-02: 5 mg via INTRAVENOUS

## 2018-03-02 MED ORDER — SUGAMMADEX SODIUM 500 MG/5ML IV SOLN
INTRAVENOUS | Status: DC | PRN
  Administered 2018-03-02: 440 mg via INTRAVENOUS

## 2018-03-02 MED ORDER — CHLORHEXIDINE GLUCONATE CLOTH 2 % EX PADS: 6.0000 | MEDICATED_PAD | Freq: Once | CUTANEOUS | Status: DC

## 2018-03-02 MED ORDER — ROCURONIUM BROMIDE 100 MG/10ML IV SOLN
INTRAVENOUS | Status: AC
  Filled 2018-03-02: qty 1

## 2018-03-02 MED ORDER — DEXAMETHASONE SODIUM PHOSPHATE 10 MG/ML IJ SOLN
INTRAMUSCULAR | Status: AC
  Filled 2018-03-02: qty 1

## 2018-03-02 MED ORDER — PROPOFOL 10 MG/ML IV BOLUS
INTRAVENOUS | Status: DC | PRN
  Administered 2018-03-02: 120 mg via INTRAVENOUS
  Administered 2018-03-02: 20 mg via INTRAVENOUS

## 2018-03-02 MED ORDER — FENTANYL CITRATE (PF) 100 MCG/2ML IJ SOLN
INTRAMUSCULAR | Status: DC | PRN
  Administered 2018-03-02: 25 ug via INTRAVENOUS
  Administered 2018-03-02: 50 ug via INTRAVENOUS
  Administered 2018-03-02: 25 ug via INTRAVENOUS

## 2018-03-02 MED ORDER — LACTATED RINGERS IV SOLN
INTRAVENOUS | Status: DC
  Administered 2018-03-02: 06:00:00 via INTRAVENOUS

## 2018-03-02 MED ORDER — BUPIVACAINE-EPINEPHRINE (PF) 0.5% -1:200000 IJ SOLN
INTRAMUSCULAR | Status: AC
  Filled 2018-03-02: qty 30

## 2018-03-02 MED ORDER — DIBUCAINE 1 % RE OINT
TOPICAL_OINTMENT | RECTAL | Status: AC
  Filled 2018-03-02: qty 28

## 2018-03-02 MED ORDER — DEXAMETHASONE SODIUM PHOSPHATE 10 MG/ML IJ SOLN
INTRAMUSCULAR | Status: DC | PRN
  Administered 2018-03-02: 8 mg via INTRAVENOUS

## 2018-03-02 MED ORDER — BUPIVACAINE LIPOSOME 1.3 % IJ SUSP
INTRAMUSCULAR | Status: DC | PRN
  Administered 2018-03-02: 20 mL

## 2018-03-02 MED ORDER — SODIUM CHLORIDE 0.9 % IR SOLN
Status: DC | PRN
  Administered 2018-03-02: 500 mL

## 2018-03-02 MED ORDER — OXYCODONE HCL 5 MG PO TABS: 5.0000 mg | ORAL_TABLET | Freq: Four times a day (QID) | ORAL | 0 refills | Status: DC | PRN

## 2018-03-02 MED ORDER — PROPOFOL 10 MG/ML IV BOLUS
INTRAVENOUS | Status: AC
  Filled 2018-03-02: qty 40

## 2018-03-02 MED ORDER — BUPIVACAINE HCL (PF) 0.25 % IJ SOLN
INTRAMUSCULAR | Status: AC
  Filled 2018-03-02: qty 30

## 2018-03-02 MED ORDER — LIDOCAINE 2% (20 MG/ML) 5 ML SYRINGE
INTRAMUSCULAR | Status: DC | PRN
  Administered 2018-03-02: 20 mg via INTRAVENOUS
  Administered 2018-03-02: 80 mg via INTRAVENOUS

## 2018-03-02 MED ORDER — METHYLENE BLUE 0.5 % INJ SOLN
INTRAVENOUS | Status: AC
  Filled 2018-03-02: qty 10

## 2018-03-02 MED ORDER — LIDOCAINE 2% (20 MG/ML) 5 ML SYRINGE
INTRAMUSCULAR | Status: AC
  Filled 2018-03-02: qty 5

## 2018-03-02 MED ORDER — ACETAMINOPHEN 500 MG PO TABS
1000.0000 mg | ORAL_TABLET | ORAL | Status: AC
  Administered 2018-03-02: 1000 mg via ORAL
  Filled 2018-03-02: qty 2

## 2018-03-02 MED ORDER — ONDANSETRON HCL 4 MG/2ML IJ SOLN
INTRAMUSCULAR | Status: AC
  Filled 2018-03-02: qty 2

## 2018-03-02 MED ORDER — FENTANYL CITRATE (PF) 100 MCG/2ML IJ SOLN
INTRAMUSCULAR | Status: AC
  Filled 2018-03-02: qty 2

## 2018-03-02 MED ORDER — METRONIDAZOLE IN NACL 5-0.79 MG/ML-% IV SOLN
500.0000 mg | INTRAVENOUS | Status: AC
  Administered 2018-03-02: 500 mg via INTRAVENOUS
  Filled 2018-03-02: qty 100

## 2018-03-02 MED ORDER — SUGAMMADEX SODIUM 200 MG/2ML IV SOLN
INTRAVENOUS | Status: AC
  Filled 2018-03-02: qty 2

## 2018-03-02 MED ORDER — BUPIVACAINE HCL 0.25 % IJ SOLN
INTRAMUSCULAR | Status: DC | PRN
  Administered 2018-03-02: 20 mL

## 2018-03-02 SURGICAL SUPPLY — 36 items
APL SKNCLS STERI-STRIP NONHPOA (GAUZE/BANDAGES/DRESSINGS) ×1
BENZOIN TINCTURE PRP APPL 2/3 (GAUZE/BANDAGES/DRESSINGS) ×2 IMPLANT
BLADE SURG 15 STRL LF DISP TIS (BLADE) ×1 IMPLANT
BLADE SURG 15 STRL SS (BLADE) ×2
BRIEF STRETCH FOR OB PAD LRG (UNDERPADS AND DIAPERS) ×2 IMPLANT
CONT SPEC 4OZ CLIKSEAL STRL BL (MISCELLANEOUS) ×1 IMPLANT
COVER SURGICAL LIGHT HANDLE (MISCELLANEOUS) ×2 IMPLANT
COVER WAND RF STERILE (DRAPES) ×1 IMPLANT
DECANTER SPIKE VIAL GLASS SM (MISCELLANEOUS) ×2 IMPLANT
DRAPE LAPAROTOMY T 102X78X121 (DRAPES) ×2 IMPLANT
DRSG PAD ABDOMINAL 8X10 ST (GAUZE/BANDAGES/DRESSINGS) IMPLANT
DRSG XEROFORM 1X8 (GAUZE/BANDAGES/DRESSINGS) ×2 IMPLANT
ELECT PENCIL ROCKER SW 15FT (MISCELLANEOUS) ×2 IMPLANT
ELECT REM PT RETURN 15FT ADLT (MISCELLANEOUS) ×2 IMPLANT
GAUZE 4X4 16PLY RFD (DISPOSABLE) ×2 IMPLANT
GAUZE SPONGE 4X4 12PLY STRL (GAUZE/BANDAGES/DRESSINGS) ×1 IMPLANT
GLOVE ECLIPSE 8.0 STRL XLNG CF (GLOVE) ×2 IMPLANT
GLOVE INDICATOR 8.0 STRL GRN (GLOVE) ×2 IMPLANT
GOWN STRL REUS W/TWL XL LVL3 (GOWN DISPOSABLE) ×4 IMPLANT
KIT BASIN OR (CUSTOM PROCEDURE TRAY) ×2 IMPLANT
LOOP VESSEL MAXI BLUE (MISCELLANEOUS) IMPLANT
NEEDLE HYPO 22GX1.5 SAFETY (NEEDLE) ×2 IMPLANT
PACK BASIC VI WITH GOWN DISP (CUSTOM PROCEDURE TRAY) ×2 IMPLANT
PAD ABD 8X10 STRL (GAUZE/BANDAGES/DRESSINGS) ×2 IMPLANT
SHEARS HARMONIC 9CM CVD (BLADE) IMPLANT
SURGILUBE 2OZ TUBE FLIPTOP (MISCELLANEOUS) ×2 IMPLANT
SUT CHROMIC 2 0 SH (SUTURE) ×2 IMPLANT
SUT CHROMIC 3 0 SH 27 (SUTURE) IMPLANT
SUT VIC AB 2-0 SH 27 (SUTURE)
SUT VIC AB 2-0 SH 27X BRD (SUTURE) IMPLANT
SUT VIC AB 2-0 UR6 27 (SUTURE) ×8 IMPLANT
SYR 20CC LL (SYRINGE) ×2 IMPLANT
SYR 3ML LL SCALE MARK (SYRINGE) IMPLANT
TOWEL OR 17X26 10 PK STRL BLUE (TOWEL DISPOSABLE) ×2 IMPLANT
TOWEL OR NON WOVEN STRL DISP B (DISPOSABLE) ×2 IMPLANT
YANKAUER SUCT BULB TIP 10FT TU (MISCELLANEOUS) ×2 IMPLANT

## 2018-03-02 NOTE — H&P (Signed)
[name] DOB: 09/27/1931 Married / Language: [name] / Race: White Male  Patient Care Team: Merrilee Seashore, MD as PCP - General (Internal Medicine) Michael Boston, MD as Consulting Physician (General Surgery) Gatha Mayer, MD as Consulting Physician (Gastroenterology) Izora Gala, MD as Consulting Physician (Otolaryngology) Adrian Prows, MD as Consulting Physician (Cardiology) ` ` Patient sent for surgical consultation at the request of Dr Carlean Purl & [facility]  Chief Complaint: Perianal rash. Probable anal fistula ` ` The patient is a elderly gentleman whose had some perianal leakage and drainage. Was sent to dermatology. Treated with topical agents without much improvement. Concern for anatomical abnormality such as fistula discussed. Had colonoscopy in 2018 that had some polyps but no major anorectal issues. Had recent MRI suspicious for a small fistula in the left aspect. Surgical consultation requested.   Patient comes in today by himself. He notes he's not having rashes much anymore but does have some chronic drainage. Denies any history of prior anorectal interventions. No major hemorrhoidal issues. Moves his bowels usually twice a day. He does have chronic atrial fibrillation. He has been on Eliquis anticoagulation for many years. Followed by Dr. Harriet Pho at first but Dr. Donnetta Simpers pretty much takes care of most of his medical health. He hasn't smoked in 13 years. He can walk about a half mile before he has to stop feeling tired. No major exertional chest pain.   No personal nor family history of GI/colon cancer, inflammatory bowel disease, irritable bowel syndrome, allergy such as Celiac Sprue, dietary/dairy problems, colitis, ulcers nor gastritis. No recent sick contacts/gastroenteritis. No travel outside the country. No changes in diet. No dysphagia to solids or liquids. No significant heartburn or reflux. No hematochezia,  hematemesis, coffee ground emesis. No evidence of prior gastric/peptic ulceration.  (Review of systems as stated in this history (HPI) or in the review of systems. Otherwise all other 12 point ROS are negative) ` ` `  CLINICAL DATA: Chronic perianal rash and drainage. Clinical concern for perianal fistula.  EXAM: MRI PELVIS WITHOUT AND WITH CONTRAST  TECHNIQUE: Multiplanar multisequence MR imaging of the pelvis was performed both before and after administration of intravenous contrast.  Creatinine was obtained on site at [facility] at [address]  Results: Creatinine 1.5 mg/dL.  CONTRAST: 61mL MULTIHANCE GADOBENATE DIMEGLUMINE 529 MG/ML IV SOLN  COMPARISON: None.  FINDINGS: Urinary Tract: Visualized bladder and urethra are unremarkable.  Bowel: There is a simple linear intersphincteric perianal fistula ([place] grade 1) in the 5 o'clock location with inferior extension to the cutaneous surface of the medial left gluteal fold (fistula tract measures approximately 3 cm in length), with associated enhancement of the fistula wall. No associated abscess. No additional perianal or perirectal fistula. No rectal wall thickening.  Vascular/Lymphatic: No acute vascular abnormality. No pathologically enlarged lymph nodes in the visualized pelvis.  Reproductive: Normal size prostate.  Other: No pelvic fluid collections.  Musculoskeletal: No aggressive appearing focal osseous lesions.  IMPRESSION: Simple linear intersphincteric Shelton Silvas grade 1) perianal fistula in the 5 o'clock location with inferior extension to the cutaneous surface of the medial left gluteal fold. No abscess.   Electronically Signed By: Ilona Sorrel M.D. On: 11/23/2017 08:44   Allergies [facility], RMA; 12/15/2017 9:55 AM) No Known Drug Allergies [12/15/2017]: Allergies Reconciled  Medication History [employer], RMA;  12/15/2017 9:58 AM) Nystatin-Triamcinolone (100000-0.1UNIT/GM-% Ointment, External) Active. Eliquis (5MG  Tablet, Oral) Active. Aspirin (81MG  Tablet DR, Oral) Active. Vitamin D (Oral) Specific strength  unknown - Active. Glucosamine (Oral) Specific strength unknown - Active. Olopatadine HCl (Ophthalmic) Specific strength unknown - Active. Hydrocortisone (2.5% Cream, Rectal) Active. Omeprazole (20MG  Capsule DR, Oral) Active. Levothyroxine Sodium (112MCG Tablet, Oral) Active. Losartan Potassium (25MG  Tablet, Oral) Active. Furosemide (20MG  Tablet, Oral) Active. Medications Reconciled    Vitals (Jacqueline Haggett RMA; 12/15/2017 9:59 AM) 12/15/2017 9:58 AM Weight: 247.4 lb Height: 68in Body Surface Area: 2.24 m Body Mass Index: 37.62 kg/m  Temp.: 96.13F(Temporal)  Pulse: 85 (Regular)  BP: 140/58 (Sitting, Left Arm, Standard)   BP (!) 159/76   Pulse 77   Temp 98.3 F (36.8 C) (Oral)   Resp 14   Ht 5\' 8"  (1.727 m)   Wt 110.2 kg   SpO2 93%   BMI 36.95 kg/m     Physical Exam [name] MD; 12/15/2017 10:22 AM)  General Mental Status-Alert. General Appearance-Not in acute distress, Not Sickly. Orientation-Oriented X3. Hydration-Well hydrated. Voice-Normal.  Integumentary Global Assessment Upon inspection and palpation of skin surfaces of the - Axillae: non-tender, no inflammation or ulceration, no drainage. and Distribution of scalp and body hair is normal. General Characteristics Temperature - normal warmth is noted.  Head and Neck Head-normocephalic, atraumatic with no lesions or palpable masses. Face Global Assessment - atraumatic, no absence of expression. Neck Global Assessment - no abnormal movements, no bruit auscultated on the right, no bruit auscultated on the left, no decreased range of motion, non-tender. Trachea-midline. Thyroid Gland Characteristics - non-tender.  Eye Eyeball -  Left-Extraocular movements intact, No Nystagmus. Eyeball - Right-Extraocular movements intact, No Nystagmus. Cornea - Left-No Hazy. Cornea - Right-No Hazy. Sclera/Conjunctiva - Left-No scleral icterus, No Discharge. Sclera/Conjunctiva - Right-No scleral icterus, No Discharge. Pupil - Left-Direct reaction to light normal. Pupil - Right-Direct reaction to light normal. Note: Wears glasses. Vision corrected  ENMT Ears Pinna - Left - no drainage observed, no generalized tenderness observed. Right - no drainage observed, no generalized tenderness observed. Nose and Sinuses External Inspection of the Nose - no destructive lesion observed. Inspection of the nares - Left - quiet respiration. Right - quiet respiration. Mouth and Throat Lips - Upper Lip - no fissures observed, no pallor noted. Lower Lip - no fissures observed, no pallor noted. Nasopharynx - no discharge present. Oral Cavity/Oropharynx - Tongue - no dryness observed. Oral Mucosa - no cyanosis observed. Hypopharynx - no evidence of airway distress observed.  Chest and Lung Exam Inspection Movements - Normal and Symmetrical. Accessory muscles - No use of accessory muscles in breathing. Palpation Palpation of the chest reveals - Non-tender. Auscultation Breath sounds - Normal and Clear.  Cardiovascular Auscultation Rhythm - Regular. Murmurs & Other Heart Sounds - Auscultation of the heart reveals - No Murmurs and No Systolic Clicks.  Abdomen Inspection Inspection of the abdomen reveals - No Visible peristalsis and No Abnormal pulsations. Umbilicus - No Bleeding, No Urine drainage. Palpation/Percussion Palpation and Percussion of the abdomen reveal - Soft, Non Tender, No Rebound tenderness, No Rigidity (guarding) and No Cutaneous hyperesthesia. Note: Morbid obese with soft with mild diastases recti. Small umbilical hernia easily reduces. Less than a centimeter defect. Low midline incision well healed without  hernia No guarding.  Male Genitourinary Sexual Maturity Tanner 5 - Adult hair pattern and Adult penile size and shape. Note: No inguinal hernias. Normal external genitalia. Epididymi, testes, and spermatic cords normal without any masses.  Rectal Note: Please refer to anoscopy section.  Left lateral opening sinus with cord tracking towards anus consistent with anal fistula.  Peripheral  Vascular Upper Extremity Inspection - Left - No Cyanotic nailbeds, Not Ischemic. Right - No Cyanotic nailbeds, Not Ischemic.  Neurologic Neurologic evaluation reveals -normal attention span and ability to concentrate, able to name objects and repeat phrases. Appropriate fund of knowledge , normal sensation and normal coordination. Mental Status Affect - not angry, not paranoid. Cranial Nerves-Normal Bilaterally. Gait-Normal.  Neuropsychiatric Mental status exam performed with findings of-able to articulate well with normal speech/language, rate, volume and coherence, thought content normal with ability to perform basic computations and apply abstract reasoning and no evidence of hallucinations, delusions, obsessions or homicidal/suicidal ideation. Note: Has some delayed response in answers but otherwise very sharp with good memory recall and understanding of his personal history  Musculoskeletal Global Assessment Spine, Ribs and Pelvis - no instability, subluxation or laxity. Right Upper Extremity - no instability, subluxation or laxity.  Lymphatic Head & Neck  General Head & Neck Lymphatics: Bilateral - Description - No Localized lymphadenopathy. Axillary  General Axillary Region: Bilateral - Description - No Localized lymphadenopathy. Femoral & Inguinal  Generalized Femoral & Inguinal Lymphatics: Left - Description - No Localized lymphadenopathy. Right - Description - No Localized lymphadenopathy.   Results [name] MD; 12/15/2017 10:37  AM) Procedures  Name Value Date Hemorrhoids Procedure Internal exam: Internal Hemorroids ( non-bleeding) Other: Perianal skin with minimal pruritus. Left posterolateral sinus opening about 2.5 cm from anal verge. I can feel a SQ cord going towards the posterior midline sphincter complex. c/w anal fistula...........Marland KitchenTolerate digital or anoscopic exam. Grade 1-2 internal hemorrhoids especially right posterior. No fissure. No definite internal sinus noted but suspected and posterior midline anal crypt. Prostate mildly enlarged but smooth. No rectal masses felt to 6 cm by digital exam. No condyloma. No pilonidal disease.  Performed: 12/15/2017 10:25 AM    Assessment & Plan  ANAL FISTULA (K60.3) Impression: History physical an MRI very suspicious for anal fistula.  I think this requires examination under anesthesia with either fistulotomy if superficial versus left or other repair if more deep/transfer enteric/complex.  He has decent exercise tolerance for 85 year old but is fully anticoagulated. I think it will be good idea to get cardiac clearance to hedge against anesthesias concerns.  Cleared by cardiology with low risk concerns. Still should be an outpatient surgery.   Current Plans Pt Education - [facility] Abscess/Fistula (AT): discussed with patient and provided information. The anatomy & physiology of the anorectal region was discussed. We discussed the pathophysiology of anorectal abscess and fistula. Differential diagnosis was discussed. Natural history progression was discussed. I stressed the importance of a bowel regimen to have daily soft bowel movements to minimize progression of disease.  The patient's condition is not adequately controlled. Non-operative treatment has not healed the fistula. Therefore, I recommended examination under anaesthesia to confirm the diagnosis and treat the fistula. I discussed techniques that may be required such as  fistulotomy, ligation by LIFT technique, and/or seton placement. Benefits & alternatives discussed. I noted a good likelihood this will help address the problem, but sometimes repeat operations and prolonged healing times may occur. Risks such as bleeding, pain, recurrence, reoperation, incontinence, heart attack, death, and other risks were discussed.  Educational handouts further explaining the pathology, treatment options, and bowel regimen were given. The patient expressed understanding & wishes to proceed. We will work to coordinate surgery for a mutually convenient time.  ANOSCOPY, DIAGNOSTIC (57017) You are being scheduled for surgery- Our schedulers will call you.  You should hear from our office's scheduling department within 5 working  days about the location, date, and time of surgery. We try to make accommodations for patient's preferences in scheduling surgery, but sometimes the OR schedule or the surgeon's schedule prevents [place] from making those accommodations.  If you have not heard from our office [phone]) in 5 working days, call the office and ask for your surgeon's nurse.  If you have other questions about your diagnosis, plan, or surgery, call the office and ask for your surgeon's nurse.   ENCOUNTER FOR PREOPERATIVE EXAMINATION FOR GENERAL SURGICAL PROCEDURE (Z01.818)  Current Plans Pt Education - [facility] Rectal Prep for Anorectal outpatient/office surgery: discussed with patient and provided information. Pt Education - [facility] Rectal Surgery HCI (Hilda Wexler): discussed with patient and provided information. Pt Education - [place] (Aracelli Woloszyn)  ANTICOAGULATED (Z79.01)  Current Plans I recommended obtaining preoperative cardiac clearance. I am concerned about the health of the patient and the ability to tolerate the operation. Therefore, we will request clearance by cardiology to better assess operative risk & see if a reevaluation, further workup, etc  is needed. Also recommendations on how medications such as for anticoagulation and blood pressure should be managed/held/restarted after surgery. Pt Education - [facility] Hold anticoagulation preoperatively  [name], MD, FACS, MASCRS Gastrointestinal and Minimally Invasive Surgery    [address] [place], [place] [address] [phone] Main / Paging [phone] Fax

## 2018-03-02 NOTE — Anesthesia Procedure Notes (Signed)
Procedure Name: Intubation Date/Time: 03/02/2018 7:45 AM Performed by: Lavina Hamman, CRNA Pre-anesthesia Checklist: Patient identified, Emergency Drugs available, Suction available, Patient being monitored and Timeout performed Patient Re-evaluated:Patient Re-evaluated prior to induction Oxygen Delivery Method: Circle system utilized Preoxygenation: Pre-oxygenation with 100% oxygen Induction Type: IV induction Ventilation: Mask ventilation without difficulty Laryngoscope Size: Mac and 4 Grade View: Grade I Tube type: Oral Tube size: 7.5 mm Number of attempts: 1 Airway Equipment and Method: Stylet Placement Confirmation: ETT inserted through vocal cords under direct vision,  positive ETCO2,  CO2 detector and breath sounds checked- equal and bilateral Secured at: 23 cm Tube secured with: Tape Dental Injury: Teeth and Oropharynx as per pre-operative assessment  Comments: ATOI

## 2018-03-02 NOTE — Interval H&P Note (Signed)
History and Physical Interval Note:  03/02/2018 6:57 AM  Carl Woods  has presented today for surgery, with the diagnosis of Anal fistula  The various methods of treatment have been discussed with the patient and family. After consideration of risks, benefits and other options for treatment, the patient has consented to  Procedure(s): ANORECTAL EXAM UNDER ANESTHESIA WITH REPAIR OF PERIRECTAL FISTULA POSSIBLE HEMORRHOIDECTOMY (N/A) as a surgical intervention .  The patient's history has been reviewed, patient examined, no change in status, stable for surgery.  I have reviewed the patient's chart and labs.  Questions were answered to the patient's satisfaction.    I have re-reviewed the the patient's records, history, medications, and allergies.  I have re-examined the patient.  I again discussed intraoperative plans and goals of post-operative recovery.  The patient agrees to proceed.  Carl Woods  1931/06/07 [id number]  Patient Care Team: Merrilee Seashore, MD as PCP - General (Internal Medicine) Michael Boston, MD as Consulting Physician (General Surgery) Gatha Mayer, MD as Consulting Physician (Gastroenterology) Izora Gala, MD as Consulting Physician (Otolaryngology) Adrian Prows, MD as Consulting Physician (Cardiology)  Patient Active Problem List   Diagnosis Date Noted  . Perianal rash 08/06/2017  . 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 Iron deficiency anemia due to chronic blood loss 04/23/2016  . Heme positive stool 04/23/2016  . Chronic anticoagulation 04/23/2016  . BARRETTS ESOPHAGUS 04/23/2009  . History of colonic polyps 04/23/2009    Past Medical History:  Diagnosis Date  . Anal fistula   . Arthritis    Fingers and hands  . Atrial fibrillation ([place])   . AVM (arteriovenous malformation)    Clipped during Colonoscopy 05/2016  . Barrett's esophagus   . Bilateral cataracts   . BPH (benign prostatic hyperplasia)   . 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 Deviated nasal septum   . Diverticulosis of sigmoid colon   . E. coli infection   . Fatty liver   . GERD (gastroesophageal reflux disease)   . History of colon polyps   . Hypothyroidism   . Iron deficiency anemia   . OSA on CPAP   . Perianal rash   . Recurrent epistaxis   . Renal cyst 01/04/2013   Small left peripelvic renal cysts , noted on US Renal  . Seasonal allergies   . Tubular adenoma     Past Surgical History:  Procedure Laterality Date  . COLONOSCOPY  multiple  . CYSTOSCOPY    . ESOPHAGOGASTRODUODENOSCOPY  multiple  . EXPLORATORY LAPAROTOMY      Social History   Socioeconomic History  . Marital status: Married    Spouse name: Not on file  . Number of children: 3  . Years of education: Not on file  . Highest education level: Not on file  Occupational History  . Occupation: retired  Scientific laboratory technician  . Financial resource strain: Not on file  . Food insecurity:    Worry: Not on file    Inability: Not on file  . Transportation needs:    Medical: Not on file    Non-medical: Not on file  Tobacco Use  . Smoking status: Former Smoker    Last attempt to quit: 2006    Years since quitting: 14.1  . Smokeless tobacco: Never Used  Substance and Sexual Activity  . Alcohol use: Yes    Comment: occasional wine  . Drug use: No  . Sexual activity: Not on file  Lifestyle  . Physical activity:  Days per week: Not on file    Minutes per session: Not on file  . Stress: Not on file  Relationships  . Social connections:    Talks on phone: Not on file    Gets together: Not on file    Attends religious service: Not on file    Active member of club or organization: Not on file    Attends meetings of clubs or organizations: Not on file    Relationship status: Not on file  . Intimate partner violence:    Fear of current or ex partner: Not on file    Emotionally abused: Not on file    Physically abused: Not on file    Forced sexual activity: Not on file   Other Topics Concern  . Not on file  Social History Narrative  . Not on file    Family History  Problem Relation Age of Onset  . Heart disease Mother   . Other Father        old age  . Ovarian cancer Sister   . Colon cancer Neg Hx   . Stomach cancer Neg Hx   . Rectal cancer Neg Hx   . Esophageal cancer Neg Hx   . Liver cancer Neg Hx     Medications Prior to Admission  Medication Sig Dispense Refill Last Dose  . apixaban (ELIQUIS) 5 MG TABS tablet Take 5 mg by mouth 2 (two) times daily.   02/27/2018 at 2300  . Cholecalciferol (VITAMIN D3) 5000 units CAPS Take 1 capsule by mouth daily.    Past Week at Unknown time  . fexofenadine (ALLEGRA ALLERGY) 180 MG tablet Take 180 mg by mouth as needed for allergies or rhinitis.   Taking  . fluticasone (FLONASE) 50 MCG/ACT nasal spray Place 2 sprays into both nostrils daily as needed for allergies.    Taking  . furosemide (LASIX) 20 MG tablet Take 20 mg by mouth daily.    03/01/2018 at Unknown time  . Glucosamine-Chondroit-Vit C-Mn (GLUCOSAMINE CHONDR 500 COMPLEX PO) Take 1 tablet by mouth daily.    Past Week at Unknown time  . hydrocortisone (PROCTOSOL HC) 2.5 % rectal cream Place 1 application rectally 2 (two) times daily. (Patient taking differently: Place 1 application rectally 2 (two) times daily as needed for hemorrhoids. ) 30 g 0 Taking  . levothyroxine (SYNTHROID, LEVOTHROID) 112 MCG tablet Take 112 mcg by mouth daily before breakfast.    03/02/2018 at 0400  . losartan (COZAAR) 25 MG tablet Take 25 mg by mouth daily.    Past Week at Unknown time  . nystatin-triamcinolone ointment (MYCOLOG) Apply 1 application topically 2 (two) times daily. (Patient taking differently: Apply 1 application topically 2 (two) times daily as needed (rash). ) 30 g 0 Taking  . olopatadine (PATANOL) 0.1 % ophthalmic solution Place 1 drop into both eyes 2 (two) times daily as needed for allergies.    Taking  . omeprazole (PRILOSEC) 20 MG capsule Take 20 mg by mouth  daily.    03/01/2018 at Unknown time  . sodium chloride (OCEAN) 0.65 % SOLN nasal spray Place 1 spray into both nostrils as needed for congestion.       Current Facility-Administered Medications  Medication Dose Route Frequency Provider Last Rate Last Dose  . bupivacaine liposome (EXPAREL) 1.3 % injection 266 mg  20 mL Infiltration Once Michael Boston, MD      . cefTRIAXone (ROCEPHIN) 2 g in sodium chloride 0.9 % 100 mL IVPB  2 g  Intravenous On Call to OR Michael Boston, MD       And  . metroNIDAZOLE (FLAGYL) IVPB 500 mg  500 mg Intravenous On Call to OR Michael Boston, MD      . Chlorhexidine Gluconate Cloth 2 % PADS 6 each  6 each Topical Once Michael Boston, MD       And  . Chlorhexidine Gluconate Cloth 2 % PADS 6 each  6 each Topical Once Michael Boston, MD      . lactated ringers infusion   Intravenous Continuous Myrtie Soman, MD 20 mL/hr at 03/02/18 [phone]       No Known Allergies  BP (!) 159/76   Pulse 77   Temp 98.3 F (36.8 C) (Oral)   Resp 14   Ht 5\' 8"  (1.727 m)   Wt 110.2 kg   SpO2 93%   BMI 36.95 kg/m   Labs: No results found for this or any previous visit (from the past 48 hour(s)).  Imaging / Studies: No results found.   [name], M.D., F.A.C.S. Gastrointestinal and Minimally Invasive Surgery [facility] [address] [place], [place] [address] [phone] Main / Paging  03/02/2018 6:57 AM    [name]

## 2018-03-02 NOTE — Op Note (Signed)
03/02/2018  8:24 AM  PATIENT:  Carl Woods  85 y.o. 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  See anesthesia record  Delay start of Pharmacological VTE agent (>24hrs) due to surgical blood loss or risk of bleeding:  no  DRAINS: none   SPECIMEN:  Source of Specimen:  Left posterior lateral anal fistula tract with left lateral hemorrhoid  DISPOSITION OF SPECIMEN:  PATHOLOGY  COUNTS:  YES  PLAN OF CARE: Discharge to home after PACU  PATIENT DISPOSITION:  PACU - hemodynamically stable.  INDICATION: Patient with probable perirectal fistula.  I recommended examination and surgical treatment:  The anatomy & physiology of the anorectal region was discussed.  We discussed the pathophysiology of anorectal abscess and fistula.  Differential diagnosis was discussed.  Natural history progression was discussed.   I stressed the importance of a bowel regimen to have daily soft bowel movements to minimize progression of disease.     The patient's condition is not adequately controlled.  Non-operative treatment has not healed the fistula.  Therefore, I recommended examination under anaesthesia to confirm the diagnosis and treat the fistula.  I discussed techniques that may be  required such as fistulotomy, ligation by LIFT technique, and/or seton placement.  Benefits & alternatives discussed.  I noted a good likelihood this will help address the problem, but sometimes repeat operations and prolonged healing times may occur.  Risks such as bleeding, pain, recurrence, reoperation, incontinence, heart attack, death, and other risks were discussed.      Educational handouts further explaining the pathology, treatment options, and bowel regimen were given.  The patient expressed understanding & wishes to proceed.  We will work to coordinate surgery for a mutually convenient time.   OR FINDINGS: Patient had a superficial anal fistula in the setting of a chronically prolapsed hemorrhoid.    External location LEFT LATERAL   about 3 cm from anal verge.  Internal location : Anterior midline analverge about 0 cm from anal verge.  DESCRIPTION:   Informed consent was confirmed. Patient underwent general anesthesia without difficulty. Patient was placed into prone positioning.  The perianal region was prepped and draped in sterile fashion. Surgical timeout confirmed or plan.  I did digital rectal examination and then transitioned over to anoscopy to get a sense of the anatomy.  I did place a probe through the external opening.  I was able to easily find an internal opening close to the anterior midline.  More distal to the anal crypts at the anal verge, just distal to the sphincter complex.  It felt superficial to this and distal to the sphincter complex.   I proceeded to transect through the chronically prolapsed hemorrhoid and perform a fistulotomy of the tract.  Confirmed again the sphincter complex was deep and proximal to the chronic fistulous tract.  I ended up excising the chronically  prolapsed left lateral hemorrhoid as well as fistula tract until I had a nice open superficial wound with healthy granulation tissue.  I did do a 2-0 Vicryl suture ligation 5 cm on the left lateral  rectum and ran that down to the proximal opening of the fistulotomy to help provide some hemostasis and get the tissue come down.  I did provide hemostasis with the fistulotomy and hemorrhoidectomy wound with some touch point cautery as well as some interrupted 2-0 chromic horizontal mattress sutures.   I reexamined the anal canal.   There is was no narrowing.  Hemostasis was excellent.  I repeated anoscopy and examination.  Hemostasis was good.  We placed fluff gauze to onlay over the wounds.  No packing done.  Patient is being extubated go to recovery room.  I am about to discuss the patient's status to the family.  Instructions are written as well.  [name], M.D., F.A.C.S. Gastrointestinal and Minimally Invasive Surgery [facility] [address] [place], [place] [address] [phone] Main / Paging

## 2018-03-02 NOTE — Anesthesia Postprocedure Evaluation (Signed)
Anesthesia Post Note  Patient: Carl Woods  Procedure(s) Performed: ANORECTAL EXAM UNDER ANESTHESIA WITH REPAIR OF SUPERFICIAL PERIRECTAL FISTULA AND HEMORRHOIDECTOMY (N/A Rectum)     Patient location during evaluation: PACU Anesthesia Type: General Level of consciousness: awake and alert Pain management: pain level controlled Vital Signs Assessment: post-procedure vital signs reviewed and stable Respiratory status: spontaneous breathing, nonlabored ventilation and respiratory function stable Cardiovascular status: blood pressure returned to baseline and stable Postop Assessment: no apparent nausea or vomiting Anesthetic complications: no    Last Vitals:  Vitals:   03/02/18 0900 03/02/18 0915  BP: 137/83 130/69  Pulse: 63 (!) 157  Resp: 12 14  Temp:    SpO2: 100% 98%    Last Pain:  Vitals:   03/02/18 0915  TempSrc:   PainSc: 0-No pain                 Lidia Collum

## 2018-03-02 NOTE — Transfer of Care (Signed)
Immediate Anesthesia Transfer of Care Note  Patient: Carl Woods  Procedure(s) Performed: Procedure(s): ANORECTAL EXAM UNDER ANESTHESIA WITH REPAIR OF SUPERFICIAL PERIRECTAL FISTULA AND HEMORRHOIDECTOMY (N/A)  Patient Location: PACU  Anesthesia Type:General  Level of Consciousness:  sedated, patient cooperative and responds to stimulation  Airway & Oxygen Therapy:Patient Spontanous Breathing and Patient connected to face mask oxgen  Post-op Assessment:  Report given to PACU RN and Post -op Vital signs reviewed and stable  Post vital signs:  Reviewed and stable  Last Vitals:  Vitals:   03/02/18 0556  BP: (!) 159/76  Pulse: 77  Resp: 14  Temp: 36.8 C  SpO2: 14%    Complications: No apparent anesthesia complications

## 2018-03-02 NOTE — Discharge Instructions (Signed)
ANORECTAL SURGERY:  °POST OPERATIVE INSTRUCTIONS ° °###################################################################### ° °EAT °Start with a pureed / full liquid diet °After 24 hours, gradually transition to a high fiber diet.   ° °CONTROL PAIN °Control pain so you can tolerate bowel movements,  °walk, sleep, tolerate sneezing/coughing, and go up/down stairs. ° ° °HAVE A BOWEL MOVEMENT DAILY °Keep your bowels regular to avoid problems.   °Taking a fiber supplement every day to keep bowels soft.   °Try a laxative to override constipation. °Use an antidairrheal to slow down diarrhea.   °Call if not better after 2 tries ° °WALK °Walk an hour a day.  Control your pain to do that. °  °CALL IF YOU HAVE PROBLEMS/CONCERNS °Call if you are still struggling despite following these instructions. °Call if you have concerns not answered by these instructions ° °###################################################################### ° ° ° °1. Take your usually prescribed home medications unless otherwise directed. °2. DIET: Follow a light bland diet the first 24 hours after arrival home, such as soup, liquids, crackers, etc.  Be sure to include lots of fluids daily.  Avoid fast food or heavy meals as your are more likely to get nauseated.  Eat a low fat the next few days after surgery.   °3. PAIN CONTROL: °a. Pain is best controlled by a usual combination of three different methods TOGETHER: °i. Ice/Heat °ii. Over the counter pain medication °iii. Prescription pain medication °b. Expect swelling and discomfort in the anus/rectal area.  Warm water baths (30-60 minutes up to 6 times a day, especially after bowel meovements) will help. Use ice for the first few days to help decrease swelling and bruising, then switch to heat such as warm towels, sitz baths, warm baths, etc to help relax tight/sore spots and speed recovery.  Some people prefer to use ice alone, heat alone, alternating between ice & heat.  Experiment to what works  for you.   °c. It is helpful to take an over-the-counter pain medication continuously for the first few weeks.  Choose one of the following that works best for you: °i. Naproxen (Aleve, etc)  Two 220mg tabs twice a day °ii. Ibuprofen (Advil, etc) Three 200mg tabs four times a day (every meal & bedtime) °iii. Acetaminophen (Tylenol, etc) 500-650mg four times a day (every meal & bedtime) °d. A  prescription for pain medication (such as oxycodone, hydrocodone, etc) should be given to you upon discharge.  Take your pain medication as prescribed.  °i. If you are having problems/concerns with the prescription medicine (does not control pain, nausea, vomiting, rash, itching, etc), please call us [phone] to see if we need to switch you to a different pain medicine that will work better for you and/or control your side effect better. °ii. If you need a refill on your pain medication, please contact your pharmacy.  They will contact our office to request authorization. Prescriptions will not be filled after 5 pm or on week-ends.  If can take up to 48 hours for it to be filled & ready so avoid waiting until you are down to thel ast pill. °e. A topical cream (Dibucaine) or a prescription for a cream (such as diltiazem 2% gel) may be given to you.  Many people find relief with topical creams.  Some people find it burns too much.  Experiment.  If it helps, use it.  If it burns, don't using it. ° °Use a Sitz Bath 4-8 times a day for relief ° ° °Sitz Bath °A sitz bath   is a warm water bath taken in the sitting position that covers only the hips and buttocks. It may be used for either healing or hygiene purposes. Sitz baths are also used to relieve pain, itching, or muscle spasms. The water may contain medicine. Moist heat will help you heal and relax.  °HOME CARE INSTRUCTIONS  °Take 3 to 4 sitz baths a day. °1. Fill the bathtub half full with warm water. °2. Sit in the water and open the drain a little. °3. Turn on the warm  water to keep the tub half full. Keep the water running constantly. °4. Soak in the water for 15 to 20 minutes. °5. After the sitz bath, pat the affected area dry first. ° ° °4. KEEP YOUR BOWELS REGULAR °a. The goal is one soft bowel movement a day °b. Avoid getting constipated.  Between the surgery and the pain medications, it is common to experience some constipation.  Increasing fluid intake and taking a fiber supplement (such as Metamucil, Citrucel, FiberCon, MiraLax, etc) 2-3 times a day regularly will usually help prevent this problem from occurring.  A mild laxative (prune juice, Milk of Magnesia, MiraLax, etc) should be taken according to package directions if there are no bowel movements after 48 hours. °c. Watch out for diarrhea.  If you have many loose bowel movements, simplify your diet to bland foods & liquids for a few days.  Stop any stool softeners and decrease your fiber supplement.  Switching to mild anti-diarrheal medications (Kayopectate, Pepto Bismol) can help.  Can try an imodium/loperamide dose.  If this worsens or does not improve, please call us. ° °5. Wound Care ° °a. Remove your bandages with your first bowel movement, usually the day after surgery.  You may have packing if you had an abscess.  Let any packing or gauze fall come out.   °b. Wear an absorbent pad or soft cotton balls in your underwear as needed to catch any drainage and help keep the area  °c. Keep the area clean and dry.  Bathe / shower every day.  Keep the area clean by showering / bathing over the incision / wound.   It is okay to soak an open wound to help wash it.  Consider using a squeeze bottle filled with warm water to gently wash the anal area.  Wet wipes or showers / gentle washing after bowel movements is often less traumatic than regular toilet paper. °d. You will often notice bleeding with bowel movements.  This should slow down by the end of the first week of surgery.  Sitting on an ice pack can  help. °e. Expect some drainage.  This should slow down by the end of the first week of surgery, but you will have occasional bleeding or drainage up to a few months after surgery.  Wear an absorbent pad or soft cotton gauze in your underwear until the drainage stops. ° °6. ACTIVITIES as tolerated:   °a. You may resume regular (light) daily activities beginning the next day--such as daily self-care, walking, climbing stairs--gradually increasing activities as tolerated.  If you can walk 30 minutes without difficulty, it is safe to try more intense activity such as jogging, treadmill, bicycling, low-impact aerobics, swimming, etc. °b. Save the most intensive and strenuous activity for last such as sit-ups, heavy lifting, contact sports, etc  Refrain from any heavy lifting or straining until you are off narcotics for pain control.   °c. DO NOT PUSH THROUGH PAIN.  Let pain   be your guide: If it hurts to do something, don't do it.  Pain is your body warning you to avoid that activity for another week until the pain goes down. d. You may drive when you are no longer taking prescription pain medication, you can comfortably sit for long periods of time, and you can safely maneuver your car and apply brakes. e. Dennis Bast may have sexual intercourse when it is comfortable.  7. FOLLOW UP in our office a. Please call [facility] at [phone] to set up an appointment to see your surgeon in the office for a follow-up appointment approximately 2-3 weeks after your surgery. b. Make sure that you call for this appointment the day you arrive home to ensure a convenient appointment time.  8. IF YOU HAVE DISABILITY OR FAMILY LEAVE FORMS, BRING THEM TO THE OFFICE FOR PROCESSING.  DO NOT GIVE THEM TO YOUR DOCTOR.        WHEN TO CALL us [phone]: 1. Poor pain control 2. Reactions / problems with new medications (rash/itching, nausea, etc)  3. Fever over 101.5 F (38.5 C) 4. Inability to urinate 5. Nausea and/or  vomiting 6. Worsening swelling or bruising 7. Continued bleeding from incision. 8. Increased pain, redness, or drainage from the incision  The clinic staff is available to answer your questions during regular business hours (8:30am-5pm).  Please dont hesitate to call and ask to speak to one of our nurses for clinical concerns.   A surgeon from [facility] is always on call at the hospitals   If you have a medical emergency, go to the nearest emergency room or call 911.    [facility], [place], [place], [place], [place]  [address] ? MAIN: [phone] ? TOLL FREE: [phone] ? FAX [phone] [email or web address]     Anal Fistula  An anal fistula is a hole that develops between the bowel and the skin near the anus. The anus allows stool (feces) to leave the body. The anus has many tiny glands that make lubricating fluid. Sometimes, these glands become plugged and infected. This can cause a fluid-filled pocket (abscess) to form. An anal fistula often occurs when an abscess becomes infected and then develops into a hole between the bowel and the skin. What are the causes? In most cases, an anal fistula is caused by a past or current buildup of pus around the anus (anal abscess). Other causes include:  A complication of surgery.  Injury to the rectum or the area around it.  Using high-energy beams (radiation) to treat the area around the rectum. What increases the risk? You are more likely to develop this condition if you have certain medical conditions or diseases, including:  Chronic inflammatory bowel disease, such as Crohn's disease or ulcerative colitis.  Colon cancer or rectal cancer.  Diverticular disease, such as diverticulitis.  A sexually transmitted infection, or STI, such as gonorrhea, chlamydia, or syphilis.  An infection that is caused by HIV. What are the signs or symptoms? Symptoms of this condition  include:  Throbbing or constant pain that may be worse while you are sitting.  Swelling or irritation around the anus.  Pus or blood from an opening near the anus.  Pain when passing stool.  Fever or chills. How is this diagnosed? This condition is diagnosed based on:  A physical exam. This may include: ? An exam to find the external opening of the fistula. ? An exam with a probe or  scope to help locate the internal opening of the fistula. ? An exam of the rectum with a gloved hand (digital rectal exam).  Imaging tests that use dye to find the exact location and path of the fistula. Tests may include: ? X-rays. ? Ultrasound. ? CT scan. ? MRI.  Other tests to find the cause of the anal fistula. How is this treated? This condition is most commonly treated with surgery. The type of surgery that is used will depend on where the fistula is located and how complex the fistula is. Surgery may include:  A fistulotomy. The whole fistula is opened up, and the contents are drained to promote healing.  Seton placement. A silk string (seton) is placed into the fistula during a fistulotomy. This helps to drain any infection and promote healing.  Advancement flap procedure. Tissue is removed from your rectum or the skin around the anus and attached to the opening of the fistula.  Bioprosthetic plug. A cone-shaped plug is made from your tissue and is used to block the opening of the fistula. Some anal fistulas do not require surgery. A nonsurgical treatment option involves injecting a fibrin glue to seal the fistula. You also may be prescribed an antibiotic medicine to treat any infection. Follow these instructions at home: Medicines  Take over-the-counter and prescription medicines only as told by your health care provider.  If you were prescribed an antibiotic medicine, take it as told by your health care provider. Do not stop taking the antibiotic even if you start to feel better.  Use  a stool softener or a laxative if told to do so by your health care provider. General instructions   Eat a high-fiber diet as told by your health care provider. This can help to prevent constipation.  Drink enough fluid to keep your urine pale yellow.  Take a warm sitz bath for 15-20 minutes, 3-4 times per day, or as told by your health care provider. Sitz baths can ease your pain and discomfort and help with healing.  Follow good hygiene to keep the anal area as clean and dry as possible. Use wet toilet paper or a moist towelette after each bowel movement.  Keep all follow-up visits as told by your health care provider. This is important. Contact a health care provider if you have:  Increased pain that is not controlled with medicines.  New redness or swelling around the anal area.  New fluid, blood, or pus coming from the anal area.  Tenderness or warmth around the anal area. Get help right away if you have:  A fever.  Severe pain.  Chills or diarrhea.  Severe problems urinating or having a bowel movement. Summary  An anal fistula is a hole that develops between the bowel and the skin near the anus.  This condition is most often caused by a buildup of pus around the anus (anal abscess). Other causes include a complication of surgery, an injury to the rectum, or the use of radiation to treat the rectal area.  This condition is most commonly treated with surgery.  Follow your health care provider's instructions about taking medicines, eating and drinking, or taking sitz baths.  Call your health care provider if you have more pain, swelling, or blood. Get help right away if you have fever, severe pain, or problems passing urine or stool. This information is not intended to replace advice given to you by your health care provider. Make sure you discuss any questions you have  with your health care provider. °Document Released: 12/27/2007 Document Revised: 05/29/2017 Document  Reviewed: 05/29/2017 °Elsevier Interactive Patient Education © 2019 [employer] ° °

## 2018-03-05 ENCOUNTER — Encounter (HOSPITAL_COMMUNITY): Payer: Self-pay | Admitting: Surgery

## 2018-04-08 DIAGNOSIS — H52222 Regular astigmatism, left eye: Secondary | ICD-10-CM | POA: Diagnosis not present

## 2018-04-08 DIAGNOSIS — H25012 Cortical age-related cataract, left eye: Secondary | ICD-10-CM | POA: Diagnosis not present

## 2018-04-08 DIAGNOSIS — H25812 Combined forms of age-related cataract, left eye: Secondary | ICD-10-CM | POA: Diagnosis not present

## 2018-04-08 DIAGNOSIS — H2512 Age-related nuclear cataract, left eye: Secondary | ICD-10-CM | POA: Diagnosis not present

## 2018-06-22 DIAGNOSIS — E039 Hypothyroidism, unspecified: Secondary | ICD-10-CM | POA: Diagnosis not present

## 2018-06-22 DIAGNOSIS — N183 Chronic kidney disease, stage 3 (moderate): Secondary | ICD-10-CM | POA: Diagnosis not present

## 2018-06-22 DIAGNOSIS — R7303 Prediabetes: Secondary | ICD-10-CM | POA: Diagnosis not present

## 2018-06-22 DIAGNOSIS — Z7189 Other specified counseling: Secondary | ICD-10-CM | POA: Diagnosis not present

## 2018-06-22 DIAGNOSIS — Z0000 Encounter for general adult medical examination without abnormal findings: Secondary | ICD-10-CM | POA: Diagnosis not present

## 2018-06-22 DIAGNOSIS — I480 Paroxysmal atrial fibrillation: Secondary | ICD-10-CM | POA: Diagnosis not present

## 2018-06-22 DIAGNOSIS — Z23 Encounter for immunization: Secondary | ICD-10-CM | POA: Diagnosis not present

## 2018-07-05 DIAGNOSIS — N183 Chronic kidney disease, stage 3 (moderate): Secondary | ICD-10-CM | POA: Diagnosis not present

## 2018-07-05 DIAGNOSIS — E039 Hypothyroidism, unspecified: Secondary | ICD-10-CM | POA: Diagnosis not present

## 2018-07-05 DIAGNOSIS — Z7189 Other specified counseling: Secondary | ICD-10-CM | POA: Diagnosis not present

## 2018-07-05 DIAGNOSIS — I10 Essential (primary) hypertension: Secondary | ICD-10-CM | POA: Diagnosis not present

## 2018-07-05 DIAGNOSIS — Z0000 Encounter for general adult medical examination without abnormal findings: Secondary | ICD-10-CM | POA: Diagnosis not present

## 2018-07-05 DIAGNOSIS — I480 Paroxysmal atrial fibrillation: Secondary | ICD-10-CM | POA: Diagnosis not present

## 2018-07-05 DIAGNOSIS — R7303 Prediabetes: Secondary | ICD-10-CM | POA: Diagnosis not present

## 2018-08-30 ENCOUNTER — Other Ambulatory Visit: Payer: Self-pay

## 2018-12-27 DIAGNOSIS — N183 Chronic kidney disease, stage 3 unspecified: Secondary | ICD-10-CM | POA: Diagnosis not present

## 2018-12-27 DIAGNOSIS — R7303 Prediabetes: Secondary | ICD-10-CM | POA: Diagnosis not present

## 2019-01-03 DIAGNOSIS — N1832 Chronic kidney disease, stage 3b: Secondary | ICD-10-CM | POA: Diagnosis not present

## 2019-01-03 DIAGNOSIS — R7303 Prediabetes: Secondary | ICD-10-CM | POA: Diagnosis not present

## 2019-01-03 DIAGNOSIS — E039 Hypothyroidism, unspecified: Secondary | ICD-10-CM | POA: Diagnosis not present

## 2019-01-03 DIAGNOSIS — I10 Essential (primary) hypertension: Secondary | ICD-10-CM | POA: Diagnosis not present

## 2019-01-03 DIAGNOSIS — I480 Paroxysmal atrial fibrillation: Secondary | ICD-10-CM | POA: Diagnosis not present

## 2019-02-24 ENCOUNTER — Ambulatory Visit: Payer: PPO

## 2019-03-17 ENCOUNTER — Ambulatory Visit: Payer: PPO

## 2019-05-12 ENCOUNTER — Encounter: Payer: Self-pay | Admitting: Physician Assistant

## 2019-05-12 ENCOUNTER — Other Ambulatory Visit: Payer: Self-pay

## 2019-05-12 ENCOUNTER — Ambulatory Visit: Payer: PPO | Admitting: Physician Assistant

## 2019-05-12 DIAGNOSIS — D485 Neoplasm of uncertain behavior of skin: Secondary | ICD-10-CM | POA: Diagnosis not present

## 2019-05-12 DIAGNOSIS — L570 Actinic keratosis: Secondary | ICD-10-CM

## 2019-05-12 DIAGNOSIS — C44311 Basal cell carcinoma of skin of nose: Secondary | ICD-10-CM | POA: Diagnosis not present

## 2019-05-12 NOTE — Progress Notes (Signed)
   Follow-Up Visit   Subjective  Carl Woods is a 86 y.o. male who presents for the following: Annual Exam (left tip of nose for 4 months-wont heal).  Location: L bulb of nose Duration: 4 months Quality: hole in his skin Associated Signs/Symptoms: tender and bleeds Modifying Factors: persistent Context: DX BCC in 2017--- recurrent   The following portions of the chart were reviewed this encounter and updated as appropriate: Tobacco  Allergies  Meds  Problems  Med Hx  Surg Hx  Fam Hx      Objective  Well appearing patient in no apparent distress; mood and affect are within normal limits.  A focused examination was performed including face, neck, chest and back. Relevant physical exam findings are noted in the Assessment and Plan.  Objective  Mid Forehead, Right Forehead, Right Temple (2): Erythematous patches with gritty scale.  Objective  Left Tip of Nose: Cautery only  Pearly papule with telangectasia. - recurrent      Assessment & Plan  AK (actinic keratosis) (4) Mid Forehead; Right Temple (2); Right Forehead  Destruction of lesion - Mid Forehead, Right Forehead, Right Temple (2) Complexity: simple   Destruction method: cryotherapy   Informed consent: discussed and consent obtained   Timeout:  patient name, date of birth, surgical site, and procedure verified Lesion destroyed using liquid nitrogen: Yes   Cryotherapy cycles:  1 Outcome: patient tolerated procedure well with no complications   Post-procedure details: wound care instructions given    Neoplasm of skin Left Tip of Nose  Skin / nail biopsy Type of biopsy: tangential   Informed consent: discussed and consent obtained   Timeout: patient name, date of birth, surgical site, and procedure verified   Anesthesia: the lesion was anesthetized in a standard fashion   Anesthetic:  1% lidocaine w/ epinephrine 1-100,000 local infiltration Instrument used: flexible razor blade   Hemostasis achieved  with: aluminum chloride and electrodesiccation   Outcome: patient tolerated procedure well   Post-procedure details: wound care instructions given    Specimen 1 - Surgical pathology Differential Diagnosis: bcc vs scc Check Margins: No

## 2019-05-12 NOTE — Patient Instructions (Signed)

## 2019-05-18 ENCOUNTER — Telehealth: Payer: Self-pay | Admitting: Physician Assistant

## 2019-05-18 NOTE — Telephone Encounter (Signed)
Phone call to patient with his Pathology results and to inform him that we will send his information over to [facility] for the [facility] procedure.  Patient aware.

## 2019-05-18 NOTE — Telephone Encounter (Signed)
Patient is calling to get pathology report from visit last week with [facility].  Chart # [id number]

## 2019-05-18 NOTE — Telephone Encounter (Signed)
-----   Message from Warren Danes, [place] sent at 05/17/2019  4:33 PM EDT ----- Mohs referral

## 2019-07-04 DIAGNOSIS — N390 Urinary tract infection, site not specified: Secondary | ICD-10-CM | POA: Diagnosis not present

## 2019-07-04 DIAGNOSIS — I480 Paroxysmal atrial fibrillation: Secondary | ICD-10-CM | POA: Diagnosis not present

## 2019-07-04 DIAGNOSIS — Z0000 Encounter for general adult medical examination without abnormal findings: Secondary | ICD-10-CM | POA: Diagnosis not present

## 2019-07-04 DIAGNOSIS — N1832 Chronic kidney disease, stage 3b: Secondary | ICD-10-CM | POA: Diagnosis not present

## 2019-07-04 DIAGNOSIS — E039 Hypothyroidism, unspecified: Secondary | ICD-10-CM | POA: Diagnosis not present

## 2019-07-21 DIAGNOSIS — C44311 Basal cell carcinoma of skin of nose: Secondary | ICD-10-CM | POA: Diagnosis not present

## 2019-08-02 DIAGNOSIS — K219 Gastro-esophageal reflux disease without esophagitis: Secondary | ICD-10-CM | POA: Diagnosis not present

## 2019-08-02 DIAGNOSIS — Z0000 Encounter for general adult medical examination without abnormal findings: Secondary | ICD-10-CM | POA: Diagnosis not present

## 2019-08-02 DIAGNOSIS — N1832 Chronic kidney disease, stage 3b: Secondary | ICD-10-CM | POA: Diagnosis not present

## 2019-08-02 DIAGNOSIS — G4733 Obstructive sleep apnea (adult) (pediatric): Secondary | ICD-10-CM | POA: Diagnosis not present

## 2019-08-02 DIAGNOSIS — E039 Hypothyroidism, unspecified: Secondary | ICD-10-CM | POA: Diagnosis not present

## 2019-08-02 DIAGNOSIS — R7303 Prediabetes: Secondary | ICD-10-CM | POA: Diagnosis not present

## 2019-08-02 DIAGNOSIS — I480 Paroxysmal atrial fibrillation: Secondary | ICD-10-CM | POA: Diagnosis not present

## 2019-08-02 DIAGNOSIS — I10 Essential (primary) hypertension: Secondary | ICD-10-CM | POA: Diagnosis not present

## 2019-08-23 ENCOUNTER — Other Ambulatory Visit: Payer: Self-pay

## 2019-11-10 DIAGNOSIS — L0100 Impetigo, unspecified: Secondary | ICD-10-CM | POA: Diagnosis not present

## 2020-01-31 ENCOUNTER — Ambulatory Visit: Payer: PPO | Admitting: Cardiology

## 2020-01-31 ENCOUNTER — Other Ambulatory Visit: Payer: Self-pay

## 2020-01-31 ENCOUNTER — Encounter: Payer: Self-pay | Admitting: Cardiology

## 2020-01-31 VITALS — BP 135/60 | HR 71 | Resp 16 | Ht 68.0 in | Wt 261.0 lb

## 2020-01-31 DIAGNOSIS — R0600 Dyspnea, unspecified: Secondary | ICD-10-CM

## 2020-01-31 DIAGNOSIS — I4821 Permanent atrial fibrillation: Secondary | ICD-10-CM | POA: Diagnosis not present

## 2020-01-31 DIAGNOSIS — I10 Essential (primary) hypertension: Secondary | ICD-10-CM | POA: Diagnosis not present

## 2020-01-31 DIAGNOSIS — R0609 Other forms of dyspnea: Secondary | ICD-10-CM | POA: Diagnosis not present

## 2020-01-31 DIAGNOSIS — I209 Angina pectoris, unspecified: Secondary | ICD-10-CM

## 2020-01-31 MED ORDER — NITROGLYCERIN 0.4 MG SL SUBL: 0.4000 mg | SUBLINGUAL_TABLET | SUBLINGUAL | 3 refills | Status: DC | PRN

## 2020-01-31 MED ORDER — ROSUVASTATIN CALCIUM 5 MG PO TABS: 5.0000 mg | ORAL_TABLET | Freq: Every day | ORAL | 2 refills | Status: DC

## 2020-01-31 MED ORDER — METOPROLOL SUCCINATE ER 25 MG PO TB24: 25.0000 mg | ORAL_TABLET | Freq: Every day | ORAL | 2 refills | Status: DC

## 2020-01-31 NOTE — Progress Notes (Signed)
Primary Physician/Referring:  Merrilee Seashore, MD  Patient ID: Carl Woods, male    DOB: 1931-10-26, 87 y.o.   MRN: [id number]  Chief Complaint  Patient presents with  . Atrial Fibrillation  . Follow-up  . Chest Pain   HPI:    Carl Woods  is a 87 y.o. Caucasian male with permanent atrial fibrillation, obstructive sleep apnea on CPAP and compliant, hypertension, mild centrilobular emphysema who I had last seen in December 2019 presents for follow-up of atrial fibrillation, palpitations and chest pain.  Patient states that over the past 1 to 2 years he has noticed gradually worsening dyspnea and also occasional episodes of chest tightness.  But however in the past 2 to 3 months with every exertional activity he feels tightness in the chest and also associated dyspnea and feels heart races.  He helps out with his wife who has got multiple medical issues as well and states that it is becoming difficult because of dyspnea and chest pain.  No PND or orthopnea.  He does have chronic leg edema that has remained stable and states that taking Lasix helps.  On the days he sees doctors are he has to go out, he does not take Lasix.  Past Medical History:  Diagnosis Date  . Anal fistula   . Arthritis    Fingers and hands  . Atrial fibrillation ([place])   . AVM (arteriovenous malformation)    Clipped during Colonoscopy 05/2016  . Barrett's esophagus   . Bilateral cataracts   . BPH (benign prostatic hyperplasia)   . 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 Deviated nasal septum   . Diverticulosis of sigmoid colon   . E. coli infection   . Fatty liver   . GERD (gastroesophageal reflux disease)   . History of colon polyps   . Hypothyroidism   . Iron deficiency anemia   . 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 OSA on CPAP   . Perianal rash   . Recurrent epistaxis   . Renal cyst 01/04/2013   Small left peripelvic renal  cysts , noted on US Renal  . SCCA (squamous cell carcinoma) of skin 10/17/2015   Left Sup Bridge of Nose (curet, cautery and 5FU)  . Seasonal allergies   . Superficial basal cell carcinoma (BCC) 10/17/2015   Left Bulb of Nose (curet, cautery and 5FU)  . Tubular adenoma    Past Surgical History:  Procedure Laterality Date  . COLONOSCOPY  multiple  . CYSTOSCOPY    . ESOPHAGOGASTRODUODENOSCOPY  multiple  . EVALUATION UNDER ANESTHESIA WITH FISTULECTOMY N/A 03/02/2018   Procedure: ANORECTAL EXAM UNDER ANESTHESIA WITH REPAIR OF SUPERFICIAL PERIRECTAL FISTULA AND HEMORRHOIDECTOMY;  Surgeon: Michael Boston, MD;  Location: [facility];  Service: General;  Laterality: N/A;  . EXPLORATORY LAPAROTOMY     Family History  Problem Relation Age of Onset  . Heart disease Mother   . Other Father        old age  . Ovarian cancer Sister   . Colon cancer Neg Hx   . Stomach cancer Neg Hx   . Rectal cancer Neg Hx   . Esophageal cancer Neg Hx   . Liver cancer Neg Hx     Social History   Tobacco Use  . Smoking status: Former Smoker    Quit date: 2006    Years since quitting: 16.0  . Smokeless tobacco: Never Used  Substance Use Topics  .  Alcohol use: Yes    Comment: occasional wine   Marital Status: Married  ROS  Review of Systems  Cardiovascular: Positive for chest pain, dyspnea on exertion (chronic and gradually worsening) and leg swelling (chronic).  Musculoskeletal: Positive for arthritis.  Gastrointestinal: Negative for hematochezia, hemorrhoids and melena.  Neurological: Positive for loss of balance.   Objective  Blood pressure 135/60, pulse 71, resp. rate 16, height '5\' 8"'  (1.727 m), weight 261 lb (118.4 kg), SpO2 96 %.  Vitals with BMI 01/31/2020 03/02/2018 03/02/2018  Height '5\' 8"'  - -  Weight 261 lbs - -  BMI 24.40 - -  Systolic 102 725 366  Diastolic 60 78 69  Pulse 71 56 157     Physical Exam HENT:     Head: Atraumatic.  Cardiovascular:     Rate and Rhythm: Rhythm irregularly  irregular.     Pulses:          Carotid pulses are 2+ on the right side and 2+ on the left side.      Dorsalis pedis pulses are 0 on the right side and 0 on the left side.       Posterior tibial pulses are 0 on the right side and 0 on the left side.     Heart sounds: No murmur heard. No gallop. No S3 or S4 sounds.      Comments: S1 is variable, S2 is normal. No JVD. There is 2-3+ chronic bilateral below knee pitting edema. Normal capillary refill time. Pedal pulse difficult to feel due to edema. No skin breakdown.  Pulmonary:     Effort: Pulmonary effort is normal.     Breath sounds: Normal breath sounds.  Abdominal:     General: Bowel sounds are normal.     Palpations: Abdomen is soft.     Hernia: A hernia is present. Hernia is present in the umbilical area and ventral area.    Laboratory examination:   External labs:    Cholesterol, total 137.000 m 07/04/2019 HDL 32.000 mg 07/04/2019 LDL-C 89.000 mg 07/04/2019 Triglycerides 84.000 mg 07/04/2019  A1C 7.000 % 07/04/2019 TSH 3.920 07/04/2019  Hemoglobin 11.600 g/d 02/23/2018  Creatinine, Serum 1.210 MG/ 07/04/2019 Potassium 3.900 mm 02/23/2018 ALT (SGPT) 22.000 IU/ 07/04/2019   Medications and allergies  No Known Allergies   Outpatient Medications Prior to Visit  Medication Sig Dispense Refill  . apixaban (ELIQUIS) 5 MG TABS tablet Take 5 mg by mouth 2 (two) times daily.    . Cholecalciferol (VITAMIN D3) 5000 units CAPS Take 1 capsule by mouth daily.     . fexofenadine (ALLEGRA) 180 MG tablet Take 180 mg by mouth as needed for allergies or rhinitis.    . fluticasone (FLONASE) 50 MCG/ACT nasal spray Place 2 sprays into both nostrils daily as needed for allergies.     . furosemide (LASIX) 20 MG tablet Take 20 mg by mouth daily.     . Glucosamine-Chondroit-Vit C-Mn (GLUCOSAMINE CHONDR 500 COMPLEX PO) Take 1 tablet by mouth daily.     . hydrocortisone (PROCTOSOL HC) 2.5 % rectal cream Place 1 application rectally 2 (two) times daily. 30 g 0   . levothyroxine (SYNTHROID, LEVOTHROID) 112 MCG tablet Take 112 mcg by mouth daily before breakfast.     . losartan (COZAAR) 25 MG tablet Take 25 mg by mouth daily.     Marland Kitchen omeprazole (PRILOSEC) 20 MG capsule Take 20 mg by mouth daily.     . sodium chloride (OCEAN) 0.65 % SOLN nasal spray  Place 1 spray into both nostrils as needed for congestion.    Marland Kitchen olopatadine (PATANOL) 0.1 % ophthalmic solution Place 1 drop into both eyes 2 (two) times daily as needed for allergies.     Marland Kitchen oxyCODONE (OXY IR/ROXICODONE) 5 MG immediate release tablet Take 1-2 tablets (5-10 mg total) by mouth every 6 (six) hours as needed for moderate pain, severe pain or breakthrough pain. 30 tablet 0   No facility-administered medications prior to visit.    Radiology:   No results found.  Cardiac Studies:   Exercise sestamibi 01/16/12:  1. Resting EKG showed A. fibrillation, poor R wave progression, non specific ST-depression and T inversion in inferior leads, cannot R/O ischemia. Stress EKG was equivocal for ischemia. There was 1.5 mm additional ST depression noted at peak exercise, which resolved at < 2 minutes into recovery. Bruce protocol for 6 minutes 7 sec. The maximum work level achieved was 7.3 MET's. 2. Perfusion imaging study demonstrated mild soft tissue attenuation consistent with diaphragmatic attenuation. There was no e/o ischemia or scar. The left ventricular systolic function was normal. This is a low risk study.   Echo 01/13/12- Normal LV systolic function, EF- 00%, Mildly enllarged LA. No significatn valvualar abnormality.  EKG:     EKG 01/31/2020: Atrial fibrillation with controlled ventricular response at the rate of 66 bpm, low-voltage complexes. No evidence of ischemia, normal QT interval. No significant change from 12/30/2017.  Assessment     ICD-10-CM   1. Permanent atrial fibrillation (HCC)  I48.21 EKG 12-Lead    PCV ECHOCARDIOGRAM COMPLETE  2. Primary hypertension  I10   3. Angina pectoris  (HCC)  I20.9 PCV ECHOCARDIOGRAM COMPLETE    PCV MYOCARDIAL PERFUSION WITH LEXISCAN    metoprolol succinate (TOPROL-XL) 25 MG 24 hr tablet    nitroGLYCERIN (NITROSTAT) 0.4 MG SL tablet    rosuvastatin (CRESTOR) 5 MG tablet  4. Dyspnea on exertion  R06.00     CHA2DS2-VASc Score is 3.  Yearly risk of stroke: 3.2% (A, HTN).  Score of 1=0.6; 2=2.2; 3=3.2; 4=4.8; 5=7.2; 6=9.8; 7=>9.8) -(CHF; HTN; vasc disease DM,  Male = 1; Age <65 =0; 65-74 = 1,  >75 =2; stroke/embolism= 2).   Medications Discontinued During This Encounter  Medication Reason  . olopatadine (PATANOL) 0.1 % ophthalmic solution Patient Preference  . oxyCODONE (OXY IR/ROXICODONE) 5 MG immediate release tablet Patient Preference    Meds ordered this encounter  Medications  . metoprolol succinate (TOPROL-XL) 25 MG 24 hr tablet    Sig: Take 1 tablet (25 mg total) by mouth daily. Take with or immediately following a meal.    Dispense:  30 tablet    Refill:  2  . nitroGLYCERIN (NITROSTAT) 0.4 MG SL tablet    Sig: Place 1 tablet (0.4 mg total) under the tongue every 5 (five) minutes as needed for up to 25 days for chest pain.    Dispense:  25 tablet    Refill:  3  . rosuvastatin (CRESTOR) 5 MG tablet    Sig: Take 1 tablet (5 mg total) by mouth daily.    Dispense:  30 tablet    Refill:  2   Orders Placed This Encounter  Procedures  . PCV MYOCARDIAL PERFUSION WITH LEXISCAN    Standing Status:   Future    Standing Expiration Date:   03/30/2020  . EKG 12-Lead  . PCV ECHOCARDIOGRAM COMPLETE    Standing Status:   Future    Standing Expiration Date:   01/30/2021  Recommendations:   Carl Woods is a 87 y.o. Caucasian male with permanent atrial fibrillation, obstructive sleep apnea on CPAP and compliant, hypertension, mild centrilobular emphysema who I had last seen in December 2019 presents for follow-up of atrial fibrillation, palpitations and chest pain.  Patient states that over the past 1 to 2 years he has noticed gradually  worsening dyspnea and also occasional episodes of chest tightness.  But however in the past 2 to 3 months with every exertional activity he feels tightness in the chest and also associated dyspnea and feels heart races.  I prescribed metoprolol succinate both palpitations and for angina pectoris. Schedule for a Lexiscan Nuclear stress test to evaluate for myocardial ischemia. Patient unable to do treadmill stress testing due to dyspnea and gait instability. Will schedule for an echocardiogram. S/L NTG was prescribed and explained how to and when to use it and to notify us if there is change in frequency of use. Office visit following the work-up/investigations.   I reviewed his labs, he now has diabetes mellitus with A1c of 7.0%.  He has an appointment to see his PCP sometime this week or next.  I also reviewed his external records and labs, lipids although appear to be< 100, but in view of DM and angina would like to start low dose statin.  I will start him on 5 mg of Crestor.  He is presently on anticoagulation with Eliquis for atrial fibrillation, continue the same for now.  Will not start aspirin in view of this.  He is to have issues with GI bleed and also fistula, this is all now resolved and he has had fistulectomy last year.  This was a 40-minute encounter with review of his prior records, external labs, and making complex decisions.    Adrian Prows, MD, [facility] 01/31/2020, 3:56 PM Office: [phone]

## 2020-02-06 ENCOUNTER — Other Ambulatory Visit: Payer: Self-pay

## 2020-02-06 ENCOUNTER — Ambulatory Visit: Payer: PPO

## 2020-02-06 DIAGNOSIS — I4821 Permanent atrial fibrillation: Secondary | ICD-10-CM | POA: Diagnosis not present

## 2020-02-06 DIAGNOSIS — I209 Angina pectoris, unspecified: Secondary | ICD-10-CM | POA: Diagnosis not present

## 2020-02-08 ENCOUNTER — Ambulatory Visit: Payer: PPO

## 2020-02-08 ENCOUNTER — Other Ambulatory Visit: Payer: Self-pay

## 2020-02-08 DIAGNOSIS — I209 Angina pectoris, unspecified: Secondary | ICD-10-CM

## 2020-02-08 NOTE — Progress Notes (Signed)
Called and spoke with patient regarding his echocardiogram results.  ?

## 2020-02-10 NOTE — Progress Notes (Signed)
Called and spoke with patient regarding his stress test results.

## 2020-02-15 DIAGNOSIS — E039 Hypothyroidism, unspecified: Secondary | ICD-10-CM | POA: Diagnosis not present

## 2020-02-15 DIAGNOSIS — I480 Paroxysmal atrial fibrillation: Secondary | ICD-10-CM | POA: Diagnosis not present

## 2020-02-15 DIAGNOSIS — I10 Essential (primary) hypertension: Secondary | ICD-10-CM | POA: Diagnosis not present

## 2020-02-15 DIAGNOSIS — R7303 Prediabetes: Secondary | ICD-10-CM | POA: Diagnosis not present

## 2020-02-18 IMAGING — MR MR PELVIS WO/W CM
8 series · 45 of 48 positions shown · IV contrast (multihance)
Comparison: None.

CLINICAL DATA: Chronic perianal rash and drainage. Clinical concern
for perianal fistula.

EXAM:
MRI PELVIS WITHOUT AND WITH CONTRAST
TECHNIQUE: Multiplanar multisequence MR imaging of the pelvis was performed
both before and after administration of intravenous contrast.
Creatinine was obtained on site at [HOSPITAL] at [HOSPITAL].
Results: Creatinine 1.5 mg/dL.
CONTRAST:  10mL MULTIHANCE GADOBENATE DIMEGLUMINE 529 MG/ML IV SOLN

[Series 3: t2_tse_sag · sagittal · 2.5mm · 0.81mm/px · 7 of 50 slices shown]
[im 1/50]
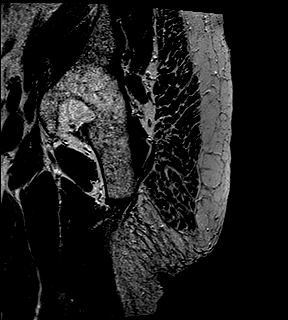
[im 9/50]
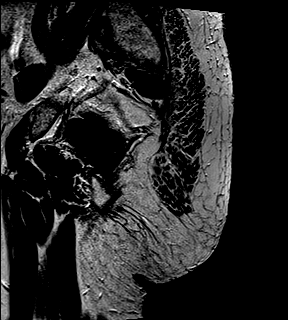
[im 17/50]
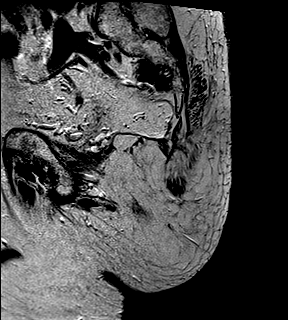
[im 25/50]
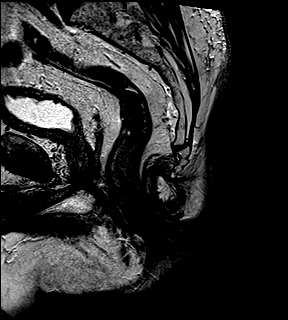
[im 33/50]
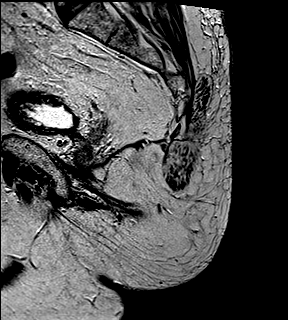
[im 41/50]
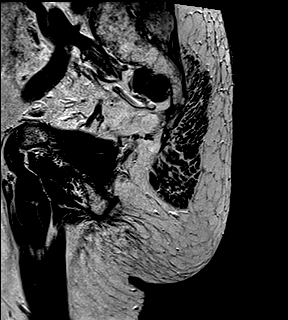
[im 50/50]
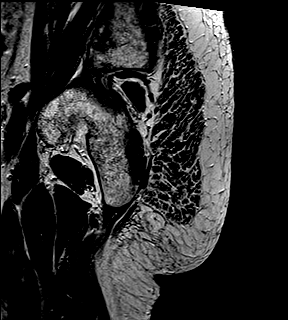

[Series 4: t2_tse_sag fs · sagittal · 2.5mm · 0.81mm/px · 7 of 50 slices shown]
[im 1/50]
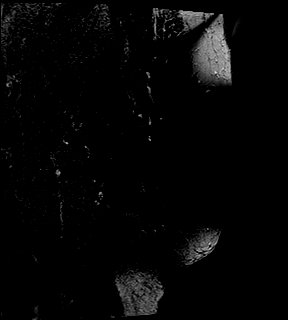
[im 9/50]
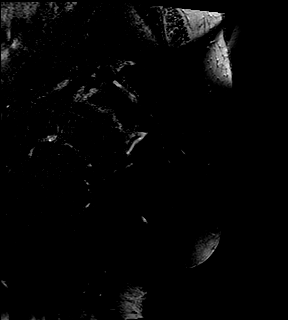
[im 17/50]
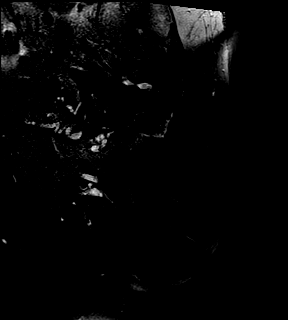
[im 25/50]
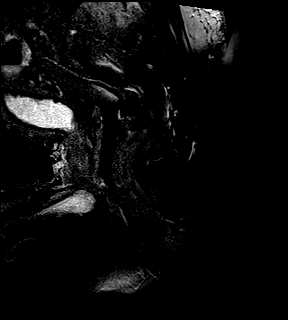
[im 33/50]
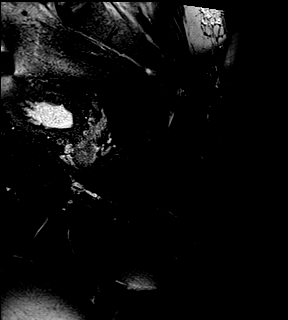
[im 41/50]
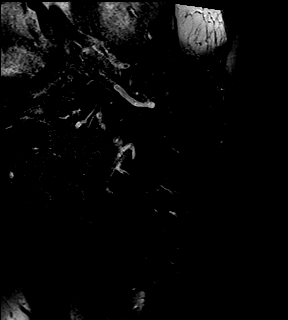
[im 50/50]
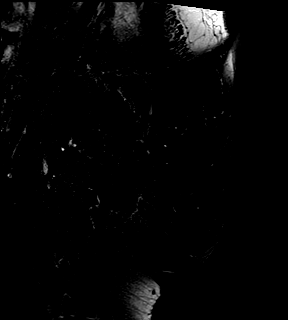

[Series 5: t1_tse axial obl · axial · 4.0mm · 0.69mm/px · z∈[-147,-7]mm · 6 of 36 slices shown]
[im 1/36]
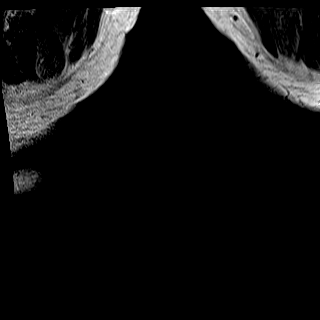
[im 8/36]
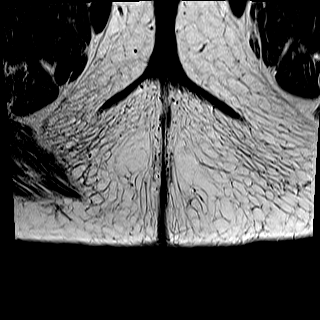
[im 15/36]
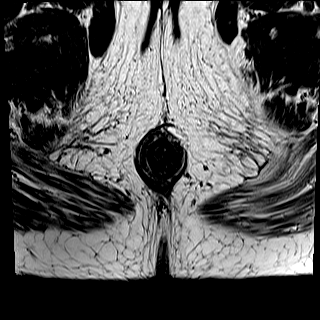
[im 22/36]
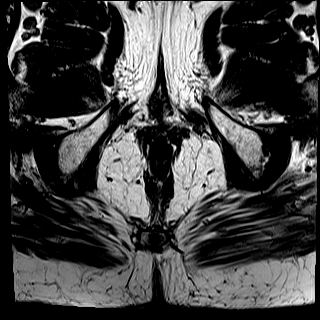
[im 29/36]
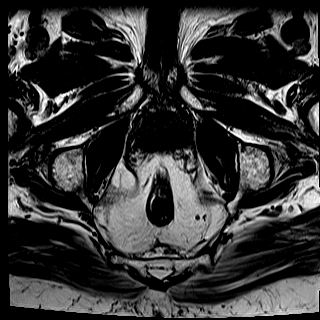
[im 36/36]
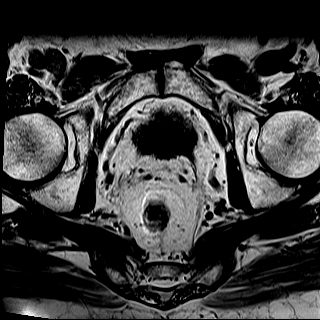

[Series 6: t2_tse axial obl · axial · 4.0mm · 0.69mm/px · z∈[-147,-7]mm · 6 of 36 slices shown (1 of 2)]
[im 1/36]
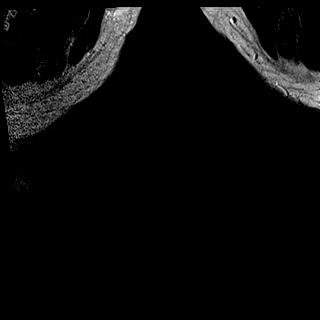
[im 8/36]
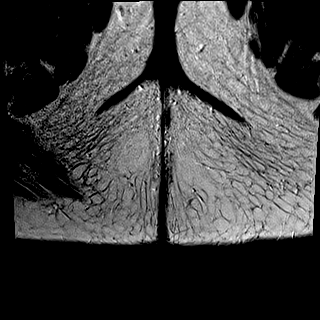
[im 15/36]
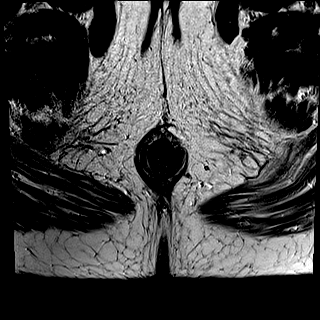
[im 22/36]
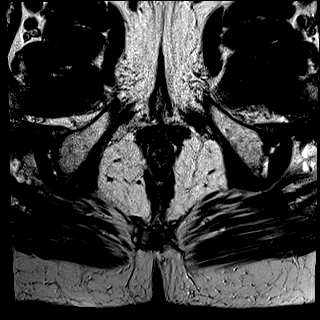
[im 29/36]
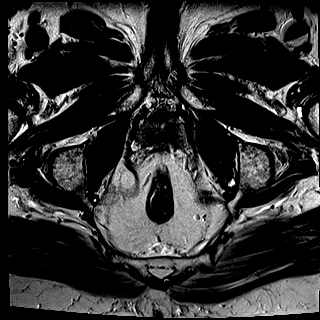
[im 36/36]
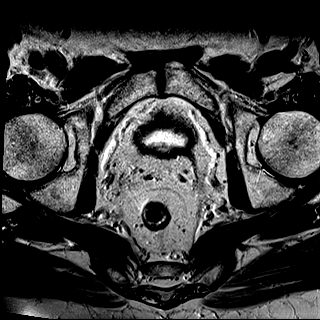

[Series 7: t2_tse axial obl · axial · 4.0mm · 0.69mm/px · z∈[-147,-7]mm · 6 of 36 slices shown (2 of 2)]
[im 1/36]
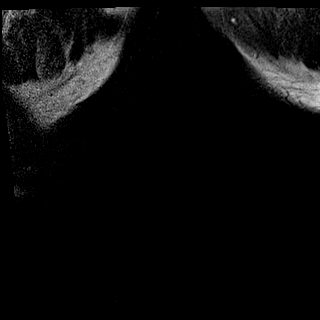
[im 8/36]
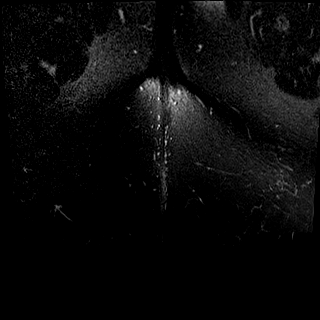
[im 15/36]
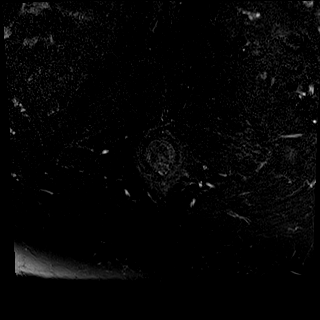
[im 22/36]
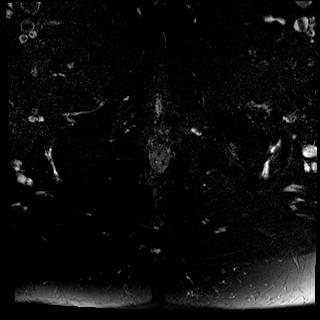
[im 29/36]
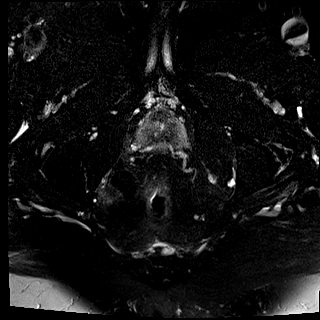
[im 36/36]
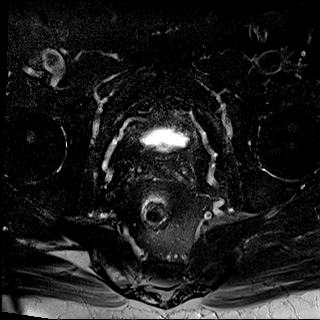

[Series 8: t2_tse_cor obl · coronal · 4.0mm · 0.94mm/px · 5 of 32 slices shown]
[im 1/32]
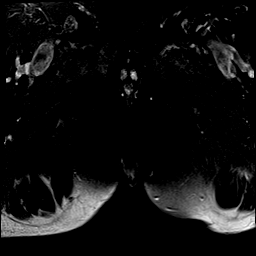
[im 8/32]
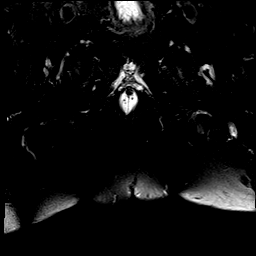
[im 16/32]
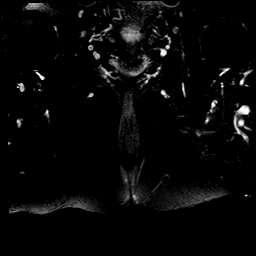
[im 24/32]
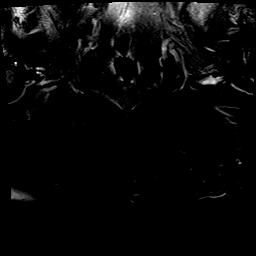
[im 32/32]
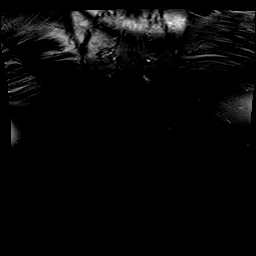

[Series 9: post t1_tse axial · axial · 4.0mm · 0.69mm/px · z∈[-147,-7]mm · 6 of 36 slices shown]
[im 1/36]
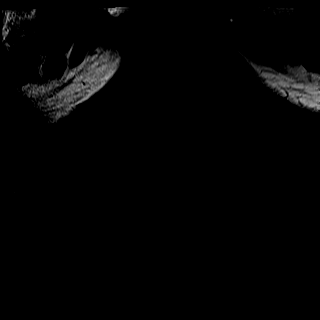
[im 8/36]
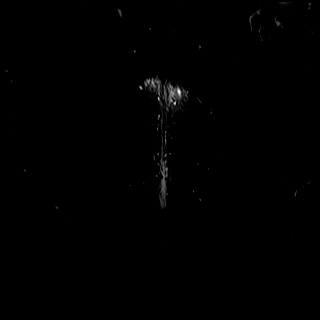
[im 15/36]
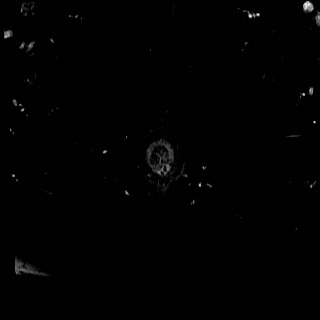
[im 22/36]
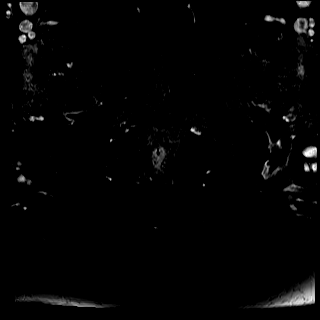
[im 29/36]
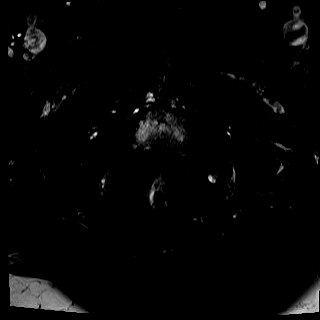
[im 36/36]
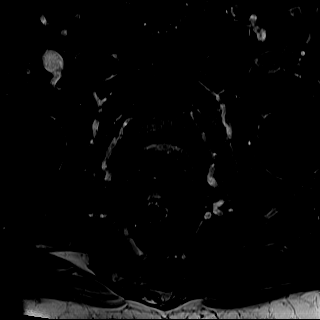

[Series 10: post t1_tse cor · coronal · 4.0mm · 0.47mm/px · 2 of 32 slices shown]
[im 1/32]
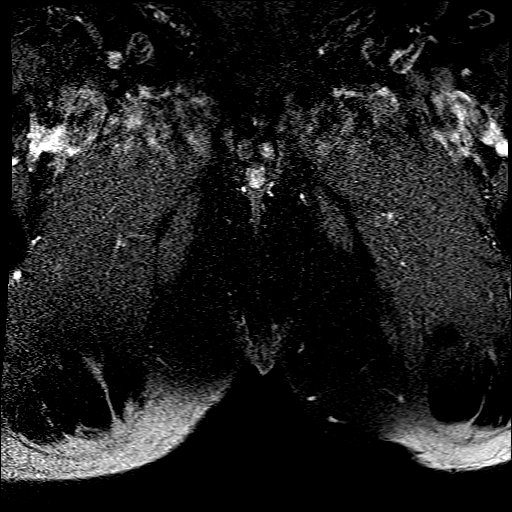
[im 8/32]
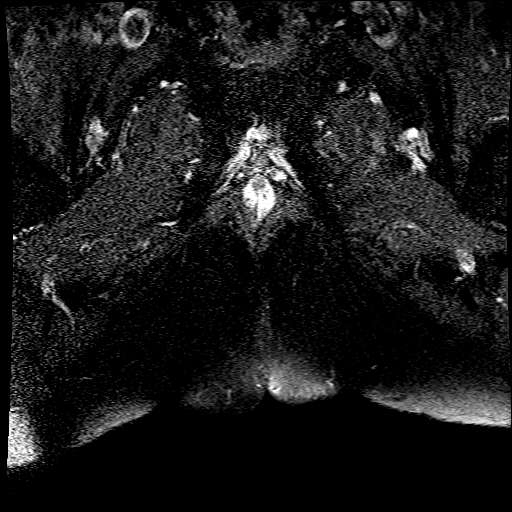

[45 of 48 positions shown; findings below may reference images not displayed]

FINDINGS: Urinary Tract:  Visualized bladder and urethra are unremarkable.

Bowel: There is a simple linear intersphincteric perianal fistula
(Riku Noizi grade 1) in the 5 o'clock location with inferior
extension to the cutaneous surface of the medial left gluteal fold
(fistula tract measures approximately 3 cm in length), with
associated enhancement of the fistula wall. No associated abscess.
No additional perianal or perirectal fistula. No rectal wall
thickening.

Vascular/Lymphatic: No acute vascular abnormality. No pathologically
enlarged lymph nodes in the visualized pelvis.

Reproductive:  Normal size prostate.

Other:  No pelvic fluid collections.

Musculoskeletal: No aggressive appearing focal osseous lesions.
IMPRESSION: Simple linear intersphincteric (Riku Noizi grade 1) perianal
fistula in the 5 o'clock location with inferior extension to the
cutaneous surface of the medial left gluteal fold. No abscess.

## 2020-02-21 DIAGNOSIS — N1832 Chronic kidney disease, stage 3b: Secondary | ICD-10-CM | POA: Diagnosis not present

## 2020-02-21 DIAGNOSIS — K219 Gastro-esophageal reflux disease without esophagitis: Secondary | ICD-10-CM | POA: Diagnosis not present

## 2020-02-21 DIAGNOSIS — E039 Hypothyroidism, unspecified: Secondary | ICD-10-CM | POA: Diagnosis not present

## 2020-02-21 DIAGNOSIS — R7303 Prediabetes: Secondary | ICD-10-CM | POA: Diagnosis not present

## 2020-02-21 DIAGNOSIS — G4733 Obstructive sleep apnea (adult) (pediatric): Secondary | ICD-10-CM | POA: Diagnosis not present

## 2020-02-21 DIAGNOSIS — I10 Essential (primary) hypertension: Secondary | ICD-10-CM | POA: Diagnosis not present

## 2020-02-21 DIAGNOSIS — I480 Paroxysmal atrial fibrillation: Secondary | ICD-10-CM | POA: Diagnosis not present

## 2020-02-24 DIAGNOSIS — H524 Presbyopia: Secondary | ICD-10-CM | POA: Diagnosis not present

## 2020-02-24 DIAGNOSIS — H2511 Age-related nuclear cataract, right eye: Secondary | ICD-10-CM | POA: Diagnosis not present

## 2020-02-24 DIAGNOSIS — H02102 Unspecified ectropion of right lower eyelid: Secondary | ICD-10-CM | POA: Diagnosis not present

## 2020-02-24 DIAGNOSIS — H40013 Open angle with borderline findings, low risk, bilateral: Secondary | ICD-10-CM | POA: Diagnosis not present

## 2020-03-05 ENCOUNTER — Other Ambulatory Visit: Payer: Self-pay

## 2020-03-05 ENCOUNTER — Ambulatory Visit: Payer: PPO | Admitting: Cardiology

## 2020-03-05 ENCOUNTER — Encounter: Payer: Self-pay | Admitting: Cardiology

## 2020-03-05 VITALS — BP 119/67 | HR 80 | Temp 97.9°F | Resp 16 | Ht 68.0 in | Wt 251.6 lb

## 2020-03-05 DIAGNOSIS — R0609 Other forms of dyspnea: Secondary | ICD-10-CM

## 2020-03-05 DIAGNOSIS — I10 Essential (primary) hypertension: Secondary | ICD-10-CM

## 2020-03-05 DIAGNOSIS — I209 Angina pectoris, unspecified: Secondary | ICD-10-CM | POA: Diagnosis not present

## 2020-03-05 DIAGNOSIS — I4821 Permanent atrial fibrillation: Secondary | ICD-10-CM

## 2020-03-05 DIAGNOSIS — R0600 Dyspnea, unspecified: Secondary | ICD-10-CM

## 2020-03-05 MED ORDER — METOPROLOL SUCCINATE ER 25 MG PO TB24: 25.0000 mg | ORAL_TABLET | Freq: Every day | ORAL | 3 refills | Status: DC

## 2020-03-05 NOTE — Progress Notes (Signed)
Primary Physician/Referring:  Merrilee Seashore, MD  Patient ID: Carl Woods, male    DOB: [birth year], 87 y.o.   MRN: [id number]  Chief Complaint  Patient presents with  . Chest Pain  . Atrial Fibrillation    4 WEEKS   HPI:    Carl Woods  is a 87 y.o. Caucasian male with permanent atrial fibrillation, obstructive sleep apnea on CPAP and compliant, hypertension, mild centrilobular emphysema who I had last seen in December 2019 presents for follow-up of atrial fibrillation, palpitations and chest pain.  I am seeing him back for gradually worsening dyspnea and occasional episodes of chest tightness with exertion that started over several months.  I had started him on metoprolol succinate which he is tolerating.  I would also start him on low-dose Crestor 5 mg daily, patient is not sure whether this is causing him arthralgias.  Otherwise no new symptomatology.  Continues to remain active.  Has occasional chest tightness with exertion but no rest pain.  Dyspnea has remained stable, no PND or orthopnea.  He does have chronic leg edema that has remained stable and states that taking Lasix helps.  On the days he sees doctors are he has to go out, he does not take Lasix.  Past Medical History:  Diagnosis Date  . Anal fistula   . Arthritis    Fingers and hands  . Atrial fibrillation ([place])   . AVM (arteriovenous malformation)    Clipped during Colonoscopy 05/2016  . Barrett's esophagus   . Bilateral cataracts   . BPH (benign prostatic hyperplasia)   . 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 Deviated nasal septum   . Diverticulosis of sigmoid colon   . E. coli infection   . Fatty liver   . GERD (gastroesophageal reflux disease)   . History of colon polyps   . Hypothyroidism   . Iron deficiency anemia   . 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 OSA on CPAP   . Perianal rash   . Recurrent epistaxis   . Renal cyst  01/04/2013   Small left peripelvic renal cysts , noted on US Renal  . SCCA (squamous cell carcinoma) of skin 10/17/2015   Left Sup Bridge of Nose (curet, cautery and 5FU)  . Seasonal allergies   . Superficial basal cell carcinoma (BCC) 10/17/2015   Left Bulb of Nose (curet, cautery and 5FU)  . Tubular adenoma    Past Surgical History:  Procedure Laterality Date  . COLONOSCOPY  multiple  . CYSTOSCOPY    . ESOPHAGOGASTRODUODENOSCOPY  multiple  . EVALUATION UNDER ANESTHESIA WITH FISTULECTOMY N/A 03/02/2018   Procedure: ANORECTAL EXAM UNDER ANESTHESIA WITH REPAIR OF SUPERFICIAL PERIRECTAL FISTULA AND HEMORRHOIDECTOMY;  Surgeon: Michael Boston, MD;  Location: [facility];  Service: General;  Laterality: N/A;  . EXPLORATORY LAPAROTOMY     Family History  Problem Relation Age of Onset  . Heart disease Mother   . Other Father        old age  . Ovarian cancer Sister   . Colon cancer Neg Hx   . Stomach cancer Neg Hx   . Rectal cancer Neg Hx   . Esophageal cancer Neg Hx   . Liver cancer Neg Hx     Social History   Tobacco Use  . Smoking status: Former Smoker    Packs/day: 0.50    Years: 40.00    Pack years: 20.00  Types: Cigarettes    Quit date: 2006    Years since quitting: 16.1  . Smokeless tobacco: Never Used  Substance Use Topics  . Alcohol use: Yes    Comment: occasional wine   Marital Status: Married  ROS  Review of Systems  Cardiovascular: Positive for chest pain, dyspnea on exertion (chronic and gradually worsening) and leg swelling (chronic).  Musculoskeletal: Positive for arthritis.  Gastrointestinal: Negative for hematochezia, hemorrhoids and melena.  Neurological: Positive for loss of balance.   Objective  Blood pressure 119/67, pulse 80, temperature 97.9 F (36.6 C), temperature source Temporal, resp. rate 16, height 5\' 8"  (1.727 m), weight 251 lb 9.6 oz (114.1 kg), SpO2 98 %.  Vitals with BMI 03/05/2020 01/31/2020 03/02/2018  Height 5\' 8"  5\' 8"  -  Weight 251 lbs 10 oz  261 lbs -  BMI 16.60 63.01 -  Systolic 601 093 235  Diastolic 67 60 78  Pulse 80 71 56     Physical Exam HENT:     Head: Atraumatic.  Cardiovascular:     Rate and Rhythm: Rhythm irregularly irregular.     Pulses:          Carotid pulses are 2+ on the right side and 2+ on the left side.      Dorsalis pedis pulses are 0 on the right side and 0 on the left side.       Posterior tibial pulses are 0 on the right side and 0 on the left side.     Heart sounds: No murmur heard. No gallop. No S3 or S4 sounds.      Comments: S1 is variable, S2 is normal. No JVD. There is 2-3+ chronic bilateral below knee pitting edema. Normal capillary refill time. Pedal pulse difficult to feel due to edema. No skin breakdown.  Pulmonary:     Effort: Pulmonary effort is normal.     Breath sounds: Normal breath sounds.  Abdominal:     General: Bowel sounds are normal.     Palpations: Abdomen is soft.     Hernia: A hernia is present. Hernia is present in the umbilical area and ventral area.    Laboratory examination:   External labs:    Cholesterol, total 137.000 m 07/04/2019 HDL 32.000 mg 07/04/2019 LDL-C 89.000 mg 07/04/2019 Triglycerides 84.000 mg 07/04/2019  A1C 7.000 % 07/04/2019 TSH 3.920 07/04/2019  Hemoglobin 11.600 g/d 02/23/2018  Creatinine, Serum 1.210 MG/ 07/04/2019 Potassium 3.900 mm 02/23/2018 ALT (SGPT) 22.000 IU/ 07/04/2019   Medications and allergies  No Known Allergies   Outpatient Medications Prior to Visit  Medication Sig Dispense Refill  . apixaban (ELIQUIS) 5 MG TABS tablet Take 5 mg by mouth 2 (two) times daily.    . Cholecalciferol (VITAMIN D3) 5000 units CAPS Take 1 capsule by mouth daily.     . fexofenadine (ALLEGRA) 180 MG tablet Take 180 mg by mouth as needed for allergies or rhinitis.    . furosemide (LASIX) 20 MG tablet Take 20 mg by mouth daily.     . Glucosamine-Chondroit-Vit C-Mn (GLUCOSAMINE CHONDR 500 COMPLEX PO) Take 1 tablet by mouth daily.     . hydrocortisone  (PROCTOSOL HC) 2.5 % rectal cream Place 1 application rectally 2 (two) times daily. 30 g 0  . ketoconazole (NIZORAL) 2 % cream APPLY TO AFFECTED AREA TWICE A DAY FOR 14 DAYS    . levothyroxine (SYNTHROID, LEVOTHROID) 112 MCG tablet Take 112 mcg by mouth daily before breakfast.     . losartan (COZAAR)  25 MG tablet Take 25 mg by mouth daily.     . nitroGLYCERIN (NITROSTAT) 0.4 MG SL tablet Place 1 tablet (0.4 mg total) under the tongue every 5 (five) minutes as needed for up to 25 days for chest pain. 25 tablet 3  . omeprazole (PRILOSEC) 20 MG capsule Take 20 mg by mouth daily.     . rosuvastatin (CRESTOR) 5 MG tablet Take 1 tablet (5 mg total) by mouth daily. 30 tablet 2  . sodium chloride (OCEAN) 0.65 % SOLN nasal spray Place 1 spray into both nostrils as needed for congestion.    . metoprolol succinate (TOPROL-XL) 25 MG 24 hr tablet Take 1 tablet (25 mg total) by mouth daily. Take with or immediately following a meal. 30 tablet 2  . fluticasone (FLONASE) 50 MCG/ACT nasal spray Place 2 sprays into both nostrils daily as needed for allergies.      No facility-administered medications prior to visit.    Radiology:   No results found.  Cardiac Studies:   Echocardiogram 02/06/2020: Study Quality: Technically difficult study. Normal LV systolic function with visual EF 55-60%. Left ventricle cavity is normal in size. Normal global wall motion. Unable to evaluate diastolic function due to atrial fibrillation.  Mild tricuspid regurgitation. Small pericardial effusion. There is no hemodynamic significance. Compared to prior study dated 01/13/2012: TR and pericardial effusion is new.     Lexiscan Tetrofosmin stress test 02/08/2020: Lexiscan nuclear stress test performed using 1-day protocol. Normal myocardial perfusion. Stress LVEF 76%. Low risk study.  No significant change from 01/16/2012.   EKG:     EKG 03/05/2020: Atrial fibrillation with controlled ventricular response at the rate of 68  bpm, normal axis.  Poor R wave progression, cannot exclude anteroseptal infarct old.  No evidence of ischemia.  Normal QT interval.  Low voltage. Poor quality EKG, V6 is missing.    No significant change from EKG 01/31/2020, 12/30/2017.  Assessment     ICD-10-CM   1. Permanent atrial fibrillation (HCC)  I48.21 EKG 12-Lead  2. Primary hypertension  I10 metoprolol succinate (TOPROL-XL) 25 MG 24 hr tablet  3. Angina pectoris (HCC)  I20.9 metoprolol succinate (TOPROL-XL) 25 MG 24 hr tablet  4. Dyspnea on exertion  R06.00     CHA2DS2-VASc Score is 3.  Yearly risk of stroke: 3.2% (A, HTN).  Score of 1=0.6; 2=2.2; 3=3.2; 4=4.8; 5=7.2; 6=9.8; 7=>9.8) -(CHF; HTN; vasc disease DM,  Male = 1; Age <65 =0; 65-74 = 1,  >75 =2; stroke/embolism= 2).    Medications Discontinued During This Encounter  Medication Reason  . metoprolol succinate (TOPROL-XL) 25 MG 24 hr tablet Reorder  . fluticasone (FLONASE) 50 MCG/ACT nasal spray Patient Preference    Meds ordered this encounter  Medications  . metoprolol succinate (TOPROL-XL) 25 MG 24 hr tablet    Sig: Take 1 tablet (25 mg total) by mouth daily. Take with or immediately following a meal.    Dispense:  90 tablet    Refill:  3   Orders Placed This Encounter  Procedures  . EKG 12-Lead   Recommendations:   CHADWICK REISWIG is a 87 y.o. Caucasian male with permanent atrial fibrillation, chronic bilateral leg edema, obstructive sleep apnea on CPAP and compliant, hypertension, mild centrilobular emphysema who I had last seen in December 2019 presents for follow-up of atrial fibrillation, palpitations and chest pain.  I am seeing him back for gradually worsening dyspnea and occasional episodes of chest tightness with exertion that started over several months.  I reviewed the results of the stress test and echocardiogram with the patient.  Overall low risk stress test.  Advised him to increase his physical activity as tolerated, I have continued his metoprolol  succinate 25 mg low-dose daily.  With regard to rosuvastatin, he is tolerating this well, however he has joint pains in his shoulder and his knee, he is not sure that his letter to the medication or not or it was there before the start of the medicine, advised him statin holiday and see if the symptoms improve and to call us if they do indeed improve so we can change to a different agent.  With regard to atrial fibrillation he remains rate controlled.  He has permanent atrial fibrillation.  I will see him back in 3 months for follow-up of his symptoms of chest tightness.   Adrian Prows, MD, [facility] 03/05/2020, 5:50 PM Office: [phone]

## 2020-03-05 NOTE — Patient Instructions (Signed)
For your joint pains, hold rosuvastatin for 2 weeks and see if the pains are better.  If they are better he can rechallenge with rosuvastatin again and if the pains come back please inform us so we can change to a different medication.  I would like you to be on a small dose of a cholesterol medicine for cardiovascular protection.  I sent refill prescription for metoprolol succinate for 90-day supply with refills.  I will see you back in 3 months.

## 2020-03-29 ENCOUNTER — Telehealth: Payer: Self-pay

## 2020-03-29 NOTE — Telephone Encounter (Signed)
No, it can be done on blood thinners as it is a bloodless surgery

## 2020-03-29 NOTE — Telephone Encounter (Signed)
Patient is having cataract eye surgery next Thursday and he wants to know if he needs to stop Eliquis and if so how many days before his surgery

## 2020-03-30 NOTE — Telephone Encounter (Signed)
Patient is aware 

## 2020-04-05 DIAGNOSIS — H52221 Regular astigmatism, right eye: Secondary | ICD-10-CM | POA: Diagnosis not present

## 2020-04-05 DIAGNOSIS — H25811 Combined forms of age-related cataract, right eye: Secondary | ICD-10-CM | POA: Diagnosis not present

## 2020-04-05 DIAGNOSIS — H2511 Age-related nuclear cataract, right eye: Secondary | ICD-10-CM | POA: Diagnosis not present

## 2020-04-05 DIAGNOSIS — H25011 Cortical age-related cataract, right eye: Secondary | ICD-10-CM | POA: Diagnosis not present

## 2020-04-26 DIAGNOSIS — E039 Hypothyroidism, unspecified: Secondary | ICD-10-CM | POA: Diagnosis not present

## 2020-04-26 DIAGNOSIS — I129 Hypertensive chronic kidney disease with stage 1 through stage 4 chronic kidney disease, or unspecified chronic kidney disease: Secondary | ICD-10-CM | POA: Diagnosis not present

## 2020-04-26 DIAGNOSIS — I480 Paroxysmal atrial fibrillation: Secondary | ICD-10-CM | POA: Diagnosis not present

## 2020-04-26 DIAGNOSIS — N1832 Chronic kidney disease, stage 3b: Secondary | ICD-10-CM | POA: Diagnosis not present

## 2020-05-01 ENCOUNTER — Other Ambulatory Visit: Payer: Self-pay | Admitting: Cardiology

## 2020-05-01 DIAGNOSIS — I209 Angina pectoris, unspecified: Secondary | ICD-10-CM

## 2020-05-22 DIAGNOSIS — I10 Essential (primary) hypertension: Secondary | ICD-10-CM | POA: Diagnosis not present

## 2020-05-22 DIAGNOSIS — E039 Hypothyroidism, unspecified: Secondary | ICD-10-CM | POA: Diagnosis not present

## 2020-05-22 DIAGNOSIS — R7303 Prediabetes: Secondary | ICD-10-CM | POA: Diagnosis not present

## 2020-05-22 DIAGNOSIS — I480 Paroxysmal atrial fibrillation: Secondary | ICD-10-CM | POA: Diagnosis not present

## 2020-05-29 DIAGNOSIS — I10 Essential (primary) hypertension: Secondary | ICD-10-CM | POA: Diagnosis not present

## 2020-05-29 DIAGNOSIS — E1165 Type 2 diabetes mellitus with hyperglycemia: Secondary | ICD-10-CM | POA: Diagnosis not present

## 2020-05-29 DIAGNOSIS — N1832 Chronic kidney disease, stage 3b: Secondary | ICD-10-CM | POA: Diagnosis not present

## 2020-05-29 DIAGNOSIS — E039 Hypothyroidism, unspecified: Secondary | ICD-10-CM | POA: Diagnosis not present

## 2020-05-29 DIAGNOSIS — I480 Paroxysmal atrial fibrillation: Secondary | ICD-10-CM | POA: Diagnosis not present

## 2020-05-31 NOTE — Progress Notes (Signed)
Primary Physician/Referring:  Merrilee Seashore, MD  Patient ID: Carl Woods, male    DOB: Oct 01, 1931, 87 y.o.   MRN: [id number]  Chief Complaint  Patient presents with  . Chest Pain  . Follow-up   HPI:    Carl Woods  is a 87 y.o. Caucasian male with permanent atrial fibrillation, obstructive sleep apnea on CPAP and compliant, hypertension, mild centrilobular emphysema.  Underwent low risk nuclear stress test in 01/2020, with essentially normal echo at that time as well.  Patient was last seen in our office 03/08/2018 by Dr. Einar Gip with complaints of worsening dyspnea and occasional episodes of chest tightness with exertion over the last several months.  He now presents for 41-monthfollow-up.  Last visit advised statin holiday given complaints of shoulder and knee pain. Patient noticed no difference in muscle pain symptoms during statin holiday, therefore he has resumed rosuvastatin. He does complain of bilateral knee pain at this time. Patient has chronic dyspnea on exertion which is stable. He unfortunately does not monitor blood pressure regularly at home. He continues to take Lasix daily with additional doses as needed. Patient has had no recurrence of chest tightness since last visit. Denies palpations, PND, orthopnea. Chronic leg edema remains stable.   Past Medical History:  Diagnosis Date  . Anal fistula   . Arthritis    Fingers and hands  . [place]   . AVM (arteriovenous malformation)    Clipped during Colonoscopy 05/2016  . Barrett's esophagus   . Bilateral cataracts   . BPH (benign prostatic hyperplasia)   . 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 Deviated nasal septum   . Diverticulosis of sigmoid colon   . E. coli infection   . Fatty liver   . GERD (gastroesophageal reflux disease)   . History of colon polyps   . Hypothyroidism   . Iron deficiency anemia   . Nodular basal cell carcinoma (BCC) 11/05/2017   Left Forehead  (treatment after biopsy)  . OSA on CPAP   . Perianal rash   . Recurrent epistaxis   . Renal cyst 01/04/2013   Small left peripelvic renal cysts , [place]  . SCCA (squamous cell carcinoma) of skin 10/17/2015   Left Sup Bridge of Nose (curet, cautery and 5FU)  . Seasonal allergies   . Superficial basal cell carcinoma (BCC) 10/17/2015   Left Bulb of Nose (curet, cautery and 5FU)  . Tubular adenoma    Past Surgical History:  Procedure Laterality Date  . COLONOSCOPY  multiple  . CYSTOSCOPY    . ESOPHAGOGASTRODUODENOSCOPY  multiple  . EVALUATION UNDER ANESTHESIA WITH FISTULECTOMY N/A 03/02/2018   Procedure: ANORECTAL EXAM UNDER ANESTHESIA WITH REPAIR OF SUPERFICIAL PERIRECTAL FISTULA AND HEMORRHOIDECTOMY;  Surgeon: GMichael Boston MD;  Location: [facility];  Service: General;  Laterality: N/A;  . EXPLORATORY LAPAROTOMY     Family History  Problem Relation Age of Onset  . Heart disease Mother   . Other Father        old age  . Ovarian cancer Sister   . Colon cancer Neg Hx   . Stomach cancer Neg Hx   . Rectal cancer Neg Hx   . Esophageal cancer Neg Hx   . Liver cancer Neg Hx     Social History   Tobacco Use  . Smoking status: Former Smoker    Packs/day: 0.50    Years: 40.00    Pack years: 20.00    Types:  Cigarettes    Quit date: 2006    Years since quitting: 16.3  . Smokeless tobacco: Never Used  Substance Use Topics  . Alcohol use: Yes    Comment: occasional wine   Marital Status: Married  ROS  Review of Systems  Constitutional: Negative for malaise/fatigue and weight gain.  Cardiovascular: Positive for dyspnea on exertion (chronic and stable) and leg swelling (chronic and stable). Negative for chest pain, claudication, near-syncope, orthopnea, palpitations, paroxysmal nocturnal dyspnea and syncope.  Respiratory: Negative for shortness of breath.   Hematologic/Lymphatic: Does not bruise/bleed easily.  Musculoskeletal: Positive for arthritis.  Gastrointestinal:  Negative for hematochezia, hemorrhoids and melena.  Neurological: Positive for loss of balance. Negative for dizziness and weakness.   Objective  Blood pressure 118/62, pulse 67, temperature 98.2 F (36.8 C), height $RemoveBe'5\' 8"'ohfAiSLlc$  (1.727 m), weight 256 lb (116.1 kg), SpO2 97 %.  Vitals with BMI 06/01/2020 03/05/2020 01/31/2020  Height $Remov'5\' 8"'xoAwVW$  $Remove'5\' 8"'HVWbWXH$  $RemoveB'5\' 8"'bseWdaVX$   Weight 256 lbs 251 lbs 10 oz 261 lbs  BMI 38.93 15.40 08.67  Systolic 619 509 326  Diastolic 62 67 60  Pulse 67 80 71     Physical Exam HENT:     Head: Atraumatic.  Cardiovascular:     Rate and Rhythm: Normal rate. Rhythm irregularly irregular.     Pulses:          Carotid pulses are 2+ on the right side and 2+ on the left side.      Dorsalis pedis pulses are 0 on the right side and 0 on the left side.       Posterior tibial pulses are 0 on the right side and 0 on the left side.     Heart sounds: No murmur heard. No gallop. No S3 or S4 sounds.      Comments: S1 is variable, S2 is normal. No JVD. Pedal pulse difficult to feel due to edema. No skin  breakdown.  Pulmonary:     Effort: Pulmonary effort is normal.     Breath sounds: Normal breath sounds.  Abdominal:     General: Bowel sounds are normal.     Palpations: Abdomen is soft.     Hernia: A hernia is present. Hernia is present in the umbilical area and ventral area.  Musculoskeletal:     Right lower leg: Edema (2+ pitting, chronic) present.     Left lower leg: Edema (2+ pitting, chronic) present.  Skin:    Capillary Refill: Capillary refill takes less than 2 seconds.    Laboratory examination:   CMP Latest Ref Rng & Units 02/23/2018  Glucose 70 - 99 mg/dL 129(H)  BUN 8 - 23 mg/dL 25(H)  Creatinine 0.61 - 1.24 mg/dL 1.36(H)  Sodium 135 - 145 mmol/L 140  Potassium 3.5 - 5.1 mmol/L 3.9  Chloride 98 - 111 mmol/L 108  CO2 22 - 32 mmol/L 24  Calcium 8.9 - 10.3 mg/dL 9.2   CBC Latest Ref Rng & Units 02/23/2018  WBC 4.0 - 10.5 K/uL 7.1  Hemoglobin 13.0 - 17.0 g/dL 11.6(L)   Hematocrit 39.0 - 52.0 % 36.3(L)  Platelets 150 - 400 K/uL 160   Lipid Panel  No results found for: CHOL, TRIG, HDL, CHOLHDL, VLDL, LDLCALC, LDLDIRECT HEMOGLOBIN A1C No results found for: HGBA1C, MPG TSH No results for input(s): TSH in the last 8760 hours.  External labs:  05/22/2020: Glucose 118, BUN 34, creatinine 1.84, GFR 35, sodium 141, potassium 4.2, alk phos 56, AST 17, ALT 16 A1c 7.4%  Total cholesterol 89, triglycerides 63, HDL 29, LDL 46   02/15/2020: Glucose 134, BUN 26, creatinine 1.64, GFR 37, sodium 143, potassium 4.6  07/04/2019: Total cholesterol 137, HDL 32, LDL 89, triglycerides 84 A1c 7.0% TSH 3.92 Creatinine 1.21  Medications and allergies  No Known Allergies   Outpatient Medications Prior to Visit  Medication Sig Dispense Refill  . apixaban (ELIQUIS) 5 MG TABS tablet Take 5 mg by mouth 2 (two) times daily.    . Cholecalciferol (VITAMIN D3) 5000 units CAPS Take 1 capsule by mouth daily.     . dapagliflozin propanediol (FARXIGA) 10 MG TABS tablet Take 10 mg by mouth daily.    . fexofenadine (ALLEGRA) 180 MG tablet Take 180 mg by mouth as needed for allergies or rhinitis.    . furosemide (LASIX) 20 MG tablet Take 20 mg by mouth daily.     . Glucosamine-Chondroit-Vit C-Mn (GLUCOSAMINE CHONDR 500 COMPLEX PO) Take 1 tablet by mouth daily.     . hydrocortisone (PROCTOSOL HC) 2.5 % rectal cream Place 1 application rectally 2 (two) times daily. 30 g 0  . ketoconazole (NIZORAL) 2 % cream APPLY TO AFFECTED AREA TWICE A DAY FOR 14 DAYS    . levothyroxine (SYNTHROID, LEVOTHROID) 112 MCG tablet Take 112 mcg by mouth daily before breakfast.     . losartan (COZAAR) 25 MG tablet Take 25 mg by mouth daily.     . metoprolol succinate (TOPROL-XL) 25 MG 24 hr tablet Take 1 tablet (25 mg total) by mouth daily. Take with or immediately following a meal. 90 tablet 3  . nitroGLYCERIN (NITROSTAT) 0.4 MG SL tablet Place 1 tablet (0.4 mg total) under the tongue every 5 (five) minutes  as needed for up to 25 days for chest pain. 25 tablet 3  . omeprazole (PRILOSEC) 20 MG capsule Take 20 mg by mouth daily.     . rosuvastatin (CRESTOR) 5 MG tablet TAKE 1 TABLET (5 MG TOTAL) BY MOUTH DAILY. 30 tablet 2  . sodium chloride (OCEAN) 0.65 % SOLN nasal spray Place 1 spray into both nostrils as needed for congestion.     No facility-administered medications prior to visit.    Radiology:   No results found.  Cardiac Studies:   Echocardiogram 02/06/2020: Study Quality: Technically difficult study. Normal LV systolic function with visual EF 55-60%. Left ventricle cavity is normal in size. Normal global wall motion. Unable to evaluate diastolic function due to atrial fibrillation.  Mild tricuspid regurgitation. Small pericardial effusion. There is no hemodynamic significance. Compared to prior study dated 01/13/2012: TR and pericardial effusion is new.   Lexiscan Tetrofosmin stress test 02/08/2020: Lexiscan nuclear stress test performed using 1-day protocol. Normal myocardial perfusion. Stress LVEF 76%. Low risk study.  No significant change from 01/16/2012.   EKG:   EKG 03/05/2020: Atrial fibrillation with controlled ventricular response at the rate of 68 bpm, normal axis.  Poor R wave progression, cannot exclude anteroseptal infarct old.  No evidence of ischemia.  Normal QT interval.  Low voltage. Poor quality EKG, V6 is missing. No significant change from EKG 01/31/2020, 12/30/2017.  Assessment     ICD-10-CM   1. Angina pectoris (HCC)  I20.9   2. Dyspnea on exertion  R06.00   3. Permanent atrial fibrillation (HCC)  I48.21   4. Primary hypertension  I10     CHA2DS2-VASc Score is 3.  Yearly risk of stroke: 3.2% (A, HTN).  Score of 1=0.6; 2=2.2; 3=3.2; 4=4.8; 5=7.2; 6=9.8; 7=>9.8) -(CHF; HTN; vasc disease DM,  Male = 1; Age <65 =0; 65-74 = 1,  >75 =2; stroke/embolism= 2).    There are no discontinued medications.  No orders of the defined types were placed in this  encounter.  No orders of the defined types were placed in this encounter.  Recommendations:   Carl Woods is a 87 y.o. Caucasian male with permanent atrial fibrillation, obstructive sleep apnea on CPAP and compliant, hypertension, mild centrilobular emphysema.  Underwent low risk nuclear stress test in 01/2020, with essentially normal echo at that time as well.  Patient was last seen in our office 03/08/2018 by Dr. Einar Gip with complaints of worsening dyspnea and occasional episodes of chest tightness with exertion over the last several months.  He now presents for 26-monthfollow-up.  Last visit advised statin holiday given complaints of shoulder and knee pain.  Patient noticed no difference in muscle pain symptoms with statin holiday, therefore he resumed rosuvastatin.  Do not suspect myalgias to be related to rosuvastatin, therefore advised patient to continue with his low-dose statin therapy.  Patient reports overall he is feeling better compared to previous visit with no recurrence of chest tightness and dyspnea on exertion is stable.  Again discussed with patient regarding echocardiogram and stress test results, details above which are reassuring patient's chest tightness symptoms and dyspnea on exertion are not related to underlying cardiac etiology.  Patient's blood pressure and lipids are well controlled.  Will not make changes to his medications at this time.  Patient has permanent atrial fibrillation, which is well rate controlled.  Patient is otherwise stable from a cardiovascular standpoint.  Follow-up in 1 year, sooner if needed, for permanent atrial fibrillation and hypertension.   CAlethia Berthold PA-C 06/04/2020, 8:49 AM Office: [phone]

## 2020-06-01 ENCOUNTER — Ambulatory Visit: Payer: PPO | Admitting: Student

## 2020-06-01 ENCOUNTER — Encounter: Payer: Self-pay | Admitting: Student

## 2020-06-01 ENCOUNTER — Other Ambulatory Visit: Payer: Self-pay

## 2020-06-01 VITALS — BP 118/62 | HR 67 | Temp 98.2°F | Ht 68.0 in | Wt 256.0 lb

## 2020-06-01 DIAGNOSIS — I10 Essential (primary) hypertension: Secondary | ICD-10-CM | POA: Diagnosis not present

## 2020-06-01 DIAGNOSIS — R0609 Other forms of dyspnea: Secondary | ICD-10-CM

## 2020-06-01 DIAGNOSIS — I4821 Permanent atrial fibrillation: Secondary | ICD-10-CM | POA: Diagnosis not present

## 2020-06-01 DIAGNOSIS — I209 Angina pectoris, unspecified: Secondary | ICD-10-CM | POA: Diagnosis not present

## 2020-06-01 DIAGNOSIS — R0600 Dyspnea, unspecified: Secondary | ICD-10-CM

## 2020-06-04 ENCOUNTER — Ambulatory Visit: Payer: PPO | Admitting: Cardiology

## 2020-07-24 DIAGNOSIS — E1165 Type 2 diabetes mellitus with hyperglycemia: Secondary | ICD-10-CM | POA: Diagnosis not present

## 2020-07-26 DIAGNOSIS — E039 Hypothyroidism, unspecified: Secondary | ICD-10-CM | POA: Diagnosis not present

## 2020-07-26 DIAGNOSIS — N1832 Chronic kidney disease, stage 3b: Secondary | ICD-10-CM | POA: Diagnosis not present

## 2020-07-26 DIAGNOSIS — I129 Hypertensive chronic kidney disease with stage 1 through stage 4 chronic kidney disease, or unspecified chronic kidney disease: Secondary | ICD-10-CM | POA: Diagnosis not present

## 2020-07-26 DIAGNOSIS — I480 Paroxysmal atrial fibrillation: Secondary | ICD-10-CM | POA: Diagnosis not present

## 2020-07-31 DIAGNOSIS — E1122 Type 2 diabetes mellitus with diabetic chronic kidney disease: Secondary | ICD-10-CM | POA: Diagnosis not present

## 2020-07-31 DIAGNOSIS — N1832 Chronic kidney disease, stage 3b: Secondary | ICD-10-CM | POA: Diagnosis not present

## 2020-07-31 DIAGNOSIS — I10 Essential (primary) hypertension: Secondary | ICD-10-CM | POA: Diagnosis not present

## 2020-08-03 ENCOUNTER — Other Ambulatory Visit: Payer: Self-pay | Admitting: Cardiology

## 2020-08-03 DIAGNOSIS — I209 Angina pectoris, unspecified: Secondary | ICD-10-CM

## 2020-09-05 DIAGNOSIS — H40013 Open angle with borderline findings, low risk, bilateral: Secondary | ICD-10-CM | POA: Diagnosis not present

## 2020-09-05 DIAGNOSIS — H16211 Exposure keratoconjunctivitis, right eye: Secondary | ICD-10-CM | POA: Diagnosis not present

## 2020-09-26 DIAGNOSIS — N1832 Chronic kidney disease, stage 3b: Secondary | ICD-10-CM | POA: Diagnosis not present

## 2020-09-26 DIAGNOSIS — E1122 Type 2 diabetes mellitus with diabetic chronic kidney disease: Secondary | ICD-10-CM | POA: Diagnosis not present

## 2020-09-26 DIAGNOSIS — I10 Essential (primary) hypertension: Secondary | ICD-10-CM | POA: Diagnosis not present

## 2020-09-26 DIAGNOSIS — Z0000 Encounter for general adult medical examination without abnormal findings: Secondary | ICD-10-CM | POA: Diagnosis not present

## 2020-10-04 DIAGNOSIS — I10 Essential (primary) hypertension: Secondary | ICD-10-CM | POA: Diagnosis not present

## 2020-10-04 DIAGNOSIS — Z23 Encounter for immunization: Secondary | ICD-10-CM | POA: Diagnosis not present

## 2020-10-04 DIAGNOSIS — E039 Hypothyroidism, unspecified: Secondary | ICD-10-CM | POA: Diagnosis not present

## 2020-10-04 DIAGNOSIS — D692 Other nonthrombocytopenic purpura: Secondary | ICD-10-CM | POA: Diagnosis not present

## 2020-10-04 DIAGNOSIS — N1832 Chronic kidney disease, stage 3b: Secondary | ICD-10-CM | POA: Diagnosis not present

## 2020-10-04 DIAGNOSIS — Z0000 Encounter for general adult medical examination without abnormal findings: Secondary | ICD-10-CM | POA: Diagnosis not present

## 2020-10-04 DIAGNOSIS — G4733 Obstructive sleep apnea (adult) (pediatric): Secondary | ICD-10-CM | POA: Diagnosis not present

## 2020-10-04 DIAGNOSIS — E1122 Type 2 diabetes mellitus with diabetic chronic kidney disease: Secondary | ICD-10-CM | POA: Diagnosis not present

## 2020-10-04 DIAGNOSIS — I4821 Permanent atrial fibrillation: Secondary | ICD-10-CM | POA: Diagnosis not present

## 2020-10-04 DIAGNOSIS — D500 Iron deficiency anemia secondary to blood loss (chronic): Secondary | ICD-10-CM | POA: Diagnosis not present

## 2020-10-04 DIAGNOSIS — R600 Localized edema: Secondary | ICD-10-CM | POA: Diagnosis not present

## 2020-10-26 DIAGNOSIS — I129 Hypertensive chronic kidney disease with stage 1 through stage 4 chronic kidney disease, or unspecified chronic kidney disease: Secondary | ICD-10-CM | POA: Diagnosis not present

## 2020-10-26 DIAGNOSIS — I480 Paroxysmal atrial fibrillation: Secondary | ICD-10-CM | POA: Diagnosis not present

## 2020-10-26 DIAGNOSIS — E039 Hypothyroidism, unspecified: Secondary | ICD-10-CM | POA: Diagnosis not present

## 2020-10-26 DIAGNOSIS — N1832 Chronic kidney disease, stage 3b: Secondary | ICD-10-CM | POA: Diagnosis not present

## 2020-11-07 ENCOUNTER — Other Ambulatory Visit: Payer: Self-pay | Admitting: Student

## 2020-11-07 DIAGNOSIS — I209 Angina pectoris, unspecified: Secondary | ICD-10-CM

## 2020-11-19 DIAGNOSIS — H0014 Chalazion left upper eyelid: Secondary | ICD-10-CM | POA: Diagnosis not present

## 2020-11-27 DIAGNOSIS — H0014 Chalazion left upper eyelid: Secondary | ICD-10-CM | POA: Diagnosis not present

## 2021-01-08 DIAGNOSIS — H52203 Unspecified astigmatism, bilateral: Secondary | ICD-10-CM | POA: Diagnosis not present

## 2021-01-08 DIAGNOSIS — H02102 Unspecified ectropion of right lower eyelid: Secondary | ICD-10-CM | POA: Diagnosis not present

## 2021-01-08 DIAGNOSIS — H0100A Unspecified blepharitis right eye, upper and lower eyelids: Secondary | ICD-10-CM | POA: Diagnosis not present

## 2021-01-08 DIAGNOSIS — H40013 Open angle with borderline findings, low risk, bilateral: Secondary | ICD-10-CM | POA: Diagnosis not present

## 2021-02-07 DIAGNOSIS — E1122 Type 2 diabetes mellitus with diabetic chronic kidney disease: Secondary | ICD-10-CM | POA: Diagnosis not present

## 2021-02-07 DIAGNOSIS — E039 Hypothyroidism, unspecified: Secondary | ICD-10-CM | POA: Diagnosis not present

## 2021-02-07 DIAGNOSIS — I10 Essential (primary) hypertension: Secondary | ICD-10-CM | POA: Diagnosis not present

## 2021-02-07 DIAGNOSIS — D500 Iron deficiency anemia secondary to blood loss (chronic): Secondary | ICD-10-CM | POA: Diagnosis not present

## 2021-02-14 DIAGNOSIS — D500 Iron deficiency anemia secondary to blood loss (chronic): Secondary | ICD-10-CM | POA: Diagnosis not present

## 2021-02-14 DIAGNOSIS — N1832 Chronic kidney disease, stage 3b: Secondary | ICD-10-CM | POA: Diagnosis not present

## 2021-02-14 DIAGNOSIS — E1122 Type 2 diabetes mellitus with diabetic chronic kidney disease: Secondary | ICD-10-CM | POA: Diagnosis not present

## 2021-03-09 ENCOUNTER — Other Ambulatory Visit: Payer: Self-pay | Admitting: Student

## 2021-03-09 DIAGNOSIS — I209 Angina pectoris, unspecified: Secondary | ICD-10-CM

## 2021-03-26 DIAGNOSIS — I129 Hypertensive chronic kidney disease with stage 1 through stage 4 chronic kidney disease, or unspecified chronic kidney disease: Secondary | ICD-10-CM | POA: Diagnosis not present

## 2021-03-26 DIAGNOSIS — N1832 Chronic kidney disease, stage 3b: Secondary | ICD-10-CM | POA: Diagnosis not present

## 2021-03-26 DIAGNOSIS — E039 Hypothyroidism, unspecified: Secondary | ICD-10-CM | POA: Diagnosis not present

## 2021-03-26 DIAGNOSIS — I480 Paroxysmal atrial fibrillation: Secondary | ICD-10-CM | POA: Diagnosis not present

## 2021-04-11 DIAGNOSIS — E1122 Type 2 diabetes mellitus with diabetic chronic kidney disease: Secondary | ICD-10-CM | POA: Diagnosis not present

## 2021-04-11 DIAGNOSIS — R7303 Prediabetes: Secondary | ICD-10-CM | POA: Diagnosis not present

## 2021-04-11 DIAGNOSIS — N1832 Chronic kidney disease, stage 3b: Secondary | ICD-10-CM | POA: Diagnosis not present

## 2021-04-11 DIAGNOSIS — D500 Iron deficiency anemia secondary to blood loss (chronic): Secondary | ICD-10-CM | POA: Diagnosis not present

## 2021-04-18 DIAGNOSIS — Z23 Encounter for immunization: Secondary | ICD-10-CM | POA: Diagnosis not present

## 2021-04-18 DIAGNOSIS — E1122 Type 2 diabetes mellitus with diabetic chronic kidney disease: Secondary | ICD-10-CM | POA: Diagnosis not present

## 2021-04-18 DIAGNOSIS — D500 Iron deficiency anemia secondary to blood loss (chronic): Secondary | ICD-10-CM | POA: Diagnosis not present

## 2021-04-18 DIAGNOSIS — I4821 Permanent atrial fibrillation: Secondary | ICD-10-CM | POA: Diagnosis not present

## 2021-04-18 DIAGNOSIS — D692 Other nonthrombocytopenic purpura: Secondary | ICD-10-CM | POA: Diagnosis not present

## 2021-05-03 ENCOUNTER — Other Ambulatory Visit: Payer: Self-pay | Admitting: Cardiology

## 2021-05-03 DIAGNOSIS — I10 Essential (primary) hypertension: Secondary | ICD-10-CM

## 2021-05-03 DIAGNOSIS — I209 Angina pectoris, unspecified: Secondary | ICD-10-CM

## 2021-06-02 NOTE — Progress Notes (Signed)
? ?Primary Physician/Referring:  Carl Seashore, MD ? ?Patient ID: Carl Woods, male    DOB: Jul 19, 1931, 88 y.o.   MRN: [id number] ? ?Chief Complaint  ?Patient presents with  ? Atrial Fibrillation  ? Hypertension  ? Follow-up  ?  1 year  ? ?HPI:   ? ?Carl Woods  is a 88 y.o. Caucasian male with permanent atrial fibrillation, obstructive sleep apnea on CPAP and compliant, hypertension, mild centrilobular emphysema.  Underwent low risk nuclear stress test in 01/2020, with essentially normal echo at that time as well. ? ?Patient presents for 1 year follow up. At last office visit he was stable form a cardiac standpoint and no changes were made.  Patient is feeling well overall.  He does report rare episodes of left-sided chest pain primarily at rest after eating lasting <10 seconds.  Denies orthopnea, significant dyspnea.  He continues to have leg edema bilaterally which is chronic and stable.  He also has chronic dyspnea on exertion which is stable. ? ?Past Medical History:  ?Diagnosis Date  ? Anal fistula   ? Arthritis   ? Fingers and hands  ? Atrial fibrillation ([place])   ? AVM (arteriovenous malformation)   ? Clipped during Colonoscopy 05/2016  ? Barrett's esophagus   ? Bilateral cataracts   ? BPH (benign prostatic hyperplasia)   ? 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 ablated at colonoscopy  ? Deviated nasal septum   ? Diverticulosis of sigmoid colon   ? E. coli infection   ? Fatty liver   ? GERD (gastroesophageal reflux disease)   ? History of colon polyps   ? Hypothyroidism   ? Iron deficiency anemia   ? Nodular basal cell carcinoma (BCC) 11/05/2017  ? Left Forehead (treatment after biopsy)  ? OSA on CPAP   ? Perianal rash   ? Recurrent epistaxis   ? Renal cyst 01/04/2013  ? Small left peripelvic renal cysts , noted on US Renal  ? SCCA (squamous cell carcinoma) of skin 10/17/2015  ? Left Sup Bridge of Nose (curet, cautery and 5FU)  ? Seasonal allergies   ? Superficial basal cell carcinoma (BCC) 10/17/2015  ?  Left Bulb of Nose (curet, cautery and 5FU)  ? Tubular adenoma   ? ?Past Surgical History:  ?Procedure Laterality Date  ? COLONOSCOPY  multiple  ? CYSTOSCOPY    ? ESOPHAGOGASTRODUODENOSCOPY  multiple  ? EVALUATION UNDER ANESTHESIA WITH FISTULECTOMY N/A 03/02/2018  ? Procedure: ANORECTAL EXAM UNDER ANESTHESIA WITH REPAIR OF SUPERFICIAL PERIRECTAL FISTULA AND HEMORRHOIDECTOMY;  Surgeon: Michael Boston, MD;  Location: [facility];  Service: General;  Laterality: N/A;  ? EXPLORATORY LAPAROTOMY    ? ?Family History  ?Problem Relation Age of Onset  ? Heart disease Mother   ? Other Father   ?     old age  ? Ovarian cancer Sister   ? Colon cancer Neg Hx   ? Stomach cancer Neg Hx   ? Rectal cancer Neg Hx   ? Esophageal cancer Neg Hx   ? Liver cancer Neg Hx   ?  ?Social History  ? ?Tobacco Use  ? Smoking status: Former  ?  Packs/day: 0.50  ?  Years: 40.00  ?  Pack years: 20.00  ?  Types: Cigarettes  ?  Quit date: 2006  ?  Years since quitting: 17.3  ? Smokeless tobacco: Never  ?Substance Use Topics  ? Alcohol use: Yes  ?  Comment: occasional wine  ? ?Marital Status: Married  ?ROS  ?Review  of Systems  ?Constitutional: Negative for malaise/fatigue and weight gain.  ?Cardiovascular:  Positive for dyspnea on exertion (chronic and stable) and leg swelling (chronic and stable). Negative for chest pain, claudication, near-syncope, orthopnea, palpitations, paroxysmal nocturnal dyspnea and syncope.  ?Respiratory:  Negative for shortness of breath.   ?Musculoskeletal:  Positive for arthritis.  ? ?Objective  ?Blood pressure (!) 130/58, pulse 60, temperature (!) 97.2 ?F (36.2 ?C), temperature source Temporal, resp. rate 16, height '5\' 8"'  (1.727 m), weight 230 lb 6.4 oz (104.5 kg), SpO2 96 %.  ? ?  06/03/2021  ? 10:06 AM 06/01/2020  ? 10:55 AM 03/05/2020  ?  5:07 PM  ?Vitals with BMI  ?Height '5\' 8"'  '5\' 8"'  '5\' 8"'   ?Weight 230 lbs 6 oz 256 lbs 251 lbs 10 oz  ?BMI 35.04 38.93 38.26  ?Systolic 619 509 326  ?Diastolic 58 62 67  ?Pulse 60 67 80  ?  ? Physical  Exam ?Vitals reviewed.  ?Constitutional:   ?   Appearance: He is obese.  ?Cardiovascular:  ?   Rate and Rhythm: Normal rate. Rhythm irregularly irregular.  ?   Pulses:     ?     Carotid pulses are 2+ on the right side and 2+ on the left side. ?     Dorsalis pedis pulses are 0 on the right side and 0 on the left side.  ?     Posterior tibial pulses are 0 on the right side and 0 on the left side.  ?   Heart sounds: No murmur heard. ?  No gallop. No S3 or S4 sounds.  ?   Comments: S1 is variable, S2 is normal. ?No JVD. ?Pedal pulse difficult to feel due to edema. No skin  breakdown.  ?Pulmonary:  ?   Effort: Pulmonary effort is normal.  ?   Breath sounds: Normal breath sounds.  ?Abdominal:  ?   Hernia: A hernia is present. Hernia is present in the umbilical area and ventral area.  ?Musculoskeletal:  ?   Right lower leg: Edema (1+ pitting, chronic) present.  ?   Left lower leg: Edema (1+ pitting, chronic) present.  ?Skin: ?   Capillary Refill: Capillary refill takes less than 2 seconds.  ? ?Laboratory examination:  ? ? ?  Latest Ref Rng & Units 02/23/2018  ?  1:45 PM  ?CMP  ?Glucose 70 - 99 mg/dL 129    ?BUN 8 - 23 mg/dL 25    ?Creatinine 0.61 - 1.24 mg/dL 1.36    ?Sodium 135 - 145 mmol/L 140    ?Potassium 3.5 - 5.1 mmol/L 3.9    ?Chloride 98 - 111 mmol/L 108    ?CO2 22 - 32 mmol/L 24    ?Calcium 8.9 - 10.3 mg/dL 9.2    ? ? ?  Latest Ref Rng & Units 02/23/2018  ?  1:45 PM  ?CBC  ?WBC 4.0 - 10.5 K/uL 7.1    ?Hemoglobin 13.0 - 17.0 g/dL 11.6    ?Hematocrit 39.0 - 52.0 % 36.3    ?Platelets 150 - 400 K/uL 160    ? ?Lipid Panel  ?No results found for: CHOL, TRIG, HDL, CHOLHDL, VLDL, LDLCALC, LDLDIRECT ?HEMOGLOBIN A1C ?No results found for: HGBA1C, MPG ?TSH ?No results for input(s): TSH in the last 8760 hours. ? ?External labs:  ?05/22/2020: ?Glucose 118, BUN 34, creatinine 1.84, GFR 35, sodium 141, potassium 4.2, alk phos 56, AST 17, ALT 16 ?A1c 7.4% ?Total cholesterol 89, triglycerides 63, HDL 29, LDL  46   ? ?02/15/2020: ?Glucose 134, BUN 26, creatinine 1.64, GFR 37, sodium 143, potassium 4.6 ? ?07/04/2019: ?Total cholesterol 137, HDL 32, LDL 89, triglycerides 84 ?A1c 7.0% ?TSH 3.92 ?Creatinine 1.21 ? ?Allergies  ?No Known Allergies  ? ?Medications Prior to Visit:  ? ?Outpatient Medications Prior to Visit  ?Medication Sig Dispense Refill  ? apixaban (ELIQUIS) 5 MG TABS tablet Take 5 mg by mouth 2 (two) times daily.    ? Cholecalciferol (VITAMIN D3) 5000 units CAPS Take 1 capsule by mouth daily.     ? dapagliflozin propanediol (FARXIGA) 10 MG TABS tablet Take 10 mg by mouth daily.    ? Fe Fum-Fe Poly-Vit C-Lactobac (FUSION) 65-65-25-30 MG CAPS Take by mouth.    ? fexofenadine (ALLEGRA) 180 MG tablet Take 180 mg by mouth as needed for allergies or rhinitis.    ? furosemide (LASIX) 20 MG tablet Take 20 mg by mouth daily.     ? Glucosamine-Chondroit-Vit C-Mn (GLUCOSAMINE CHONDR 500 COMPLEX PO) Take 1 tablet by mouth daily.     ? ketoconazole (NIZORAL) 2 % cream APPLY TO AFFECTED AREA TWICE A DAY FOR 14 DAYS    ? levothyroxine (SYNTHROID, LEVOTHROID) 112 MCG tablet Take 112 mcg by mouth daily before breakfast.     ? losartan (COZAAR) 25 MG tablet Take 25 mg by mouth daily.     ? metoprolol succinate (TOPROL-XL) 25 MG 24 hr tablet TAKE 1 TABLET (25 MG TOTAL) BY MOUTH DAILY. TAKE WITH OR IMMEDIATELY FOLLOWING A MEAL. 90 tablet 3  ? nitroGLYCERIN (NITROSTAT) 0.4 MG SL tablet Place 1 tablet (0.4 mg total) under the tongue every 5 (five) minutes as needed for up to 25 days for chest pain. 25 tablet 3  ? omeprazole (PRILOSEC) 20 MG capsule Take 20 mg by mouth daily.     ? rosuvastatin (CRESTOR) 5 MG tablet TAKE 1 TABLET (5 MG TOTAL) BY MOUTH DAILY. 90 tablet 0  ? sodium chloride (OCEAN) 0.65 % SOLN nasal spray Place 1 spray into both nostrils as needed for congestion.    ? hydrocortisone (PROCTOSOL HC) 2.5 % rectal cream Place 1 application rectally 2 (two) times daily. 30 g 0  ? ?No facility-administered medications prior to  visit.  ? ?Final Medications at End of Visit   ? ?Current Meds  ?Medication Sig  ? apixaban (ELIQUIS) 5 MG TABS tablet Take 5 mg by mouth 2 (two) times daily.  ? Cholecalciferol (VITAMIN D3) 5000 units CAPS Take 1 capsule by mouth

## 2021-06-03 ENCOUNTER — Ambulatory Visit: Payer: PPO | Admitting: Student

## 2021-06-03 ENCOUNTER — Encounter: Payer: Self-pay | Admitting: Student

## 2021-06-03 VITALS — BP 130/58 | HR 60 | Temp 97.2°F | Resp 16 | Ht 68.0 in | Wt 230.4 lb

## 2021-06-03 DIAGNOSIS — I4821 Permanent atrial fibrillation: Secondary | ICD-10-CM

## 2021-06-03 DIAGNOSIS — R0609 Other forms of dyspnea: Secondary | ICD-10-CM | POA: Diagnosis not present

## 2021-06-03 DIAGNOSIS — I10 Essential (primary) hypertension: Secondary | ICD-10-CM | POA: Diagnosis not present

## 2021-06-07 ENCOUNTER — Other Ambulatory Visit: Payer: Self-pay | Admitting: Student

## 2021-06-07 DIAGNOSIS — I209 Angina pectoris, unspecified: Secondary | ICD-10-CM

## 2021-06-18 ENCOUNTER — Ambulatory Visit: Payer: PPO

## 2021-06-18 DIAGNOSIS — I4821 Permanent atrial fibrillation: Secondary | ICD-10-CM | POA: Diagnosis not present

## 2021-06-25 NOTE — Progress Notes (Signed)
Called and spoke to pt, pt voiced understanding.

## 2021-07-23 DIAGNOSIS — H02105 Unspecified ectropion of left lower eyelid: Secondary | ICD-10-CM | POA: Diagnosis not present

## 2021-07-23 DIAGNOSIS — H02102 Unspecified ectropion of right lower eyelid: Secondary | ICD-10-CM | POA: Diagnosis not present

## 2021-07-23 DIAGNOSIS — H40013 Open angle with borderline findings, low risk, bilateral: Secondary | ICD-10-CM | POA: Diagnosis not present

## 2021-08-13 DIAGNOSIS — D500 Iron deficiency anemia secondary to blood loss (chronic): Secondary | ICD-10-CM | POA: Diagnosis not present

## 2021-08-13 DIAGNOSIS — E1122 Type 2 diabetes mellitus with diabetic chronic kidney disease: Secondary | ICD-10-CM | POA: Diagnosis not present

## 2021-08-13 DIAGNOSIS — D692 Other nonthrombocytopenic purpura: Secondary | ICD-10-CM | POA: Diagnosis not present

## 2021-08-13 DIAGNOSIS — I4821 Permanent atrial fibrillation: Secondary | ICD-10-CM | POA: Diagnosis not present

## 2021-08-19 ENCOUNTER — Ambulatory Visit
Admission: EM | Admit: 2021-08-19 | Discharge: 2021-08-19 | Disposition: A | Discharge status: Home / Self Care | Payer: PPO | Attending: Internal Medicine | Admitting: Internal Medicine

## 2021-08-19 ENCOUNTER — Encounter: Payer: Self-pay | Admitting: Emergency Medicine

## 2021-08-19 DIAGNOSIS — L03113 Cellulitis of right upper limb: Secondary | ICD-10-CM

## 2021-08-19 DIAGNOSIS — S50811A Abrasion of right forearm, initial encounter: Secondary | ICD-10-CM

## 2021-08-19 MED ORDER — CEPHALEXIN 500 MG PO CAPS
500.0000 mg | ORAL_CAPSULE | Freq: Four times a day (QID) | ORAL | 0 refills | Status: AC
Start: 2021-08-19 — End: 2021-08-24

## 2021-08-19 NOTE — ED Notes (Signed)
Wound cleansed and dressed with antibacterial cream, non adherent gauze and coband

## 2021-08-19 NOTE — ED Triage Notes (Signed)
Pt is present today with a wound on the left side of his arm. Pt states tat he scrapped his arm on the side of the bathroom mirror this weekend. Pt states is wound is only tender to the touch

## 2021-08-19 NOTE — Discharge Instructions (Signed)
The abrasion to your arm appears infected.  You have been prescribed antibiotic to help treat this.  Please keep covered until healed over.  Follow-up if symptoms persist or worsen.

## 2021-08-19 NOTE — ED Provider Notes (Signed)
[facility]    CSN: [id number] Arrival date & time: 08/19/21  1550      History   Chief Complaint Chief Complaint  Patient presents with   Wound Check    HPI Carl Woods is a 88 y.o. male.   Patient presents with abrasion to right forearm/elbow that occurred about 4 days ago.  Patient reports that he was cleaning his bathroom when he accidentally scraped his elbow on the vanity mirror.  He is concerned for infection given that it has worsened and appears to be swollen.  Denies any purulent drainage from the area.  Denies any associated fever.  Patient reports that his tetanus vaccine was updated a few years prior within the 5-year timeframe.   Wound Check    Past Medical History:  Diagnosis Date   Anal fistula    Arthritis    Fingers and hands   Atrial fibrillation ([place])    AVM (arteriovenous malformation)    Clipped during Colonoscopy 05/2016   Barrett's esophagus    Bilateral cataracts    BPH (benign prostatic hyperplasia)    Cecal angiodysplasia 05/28/2016   ablated at colonoscopy   Deviated nasal septum    Diverticulosis of sigmoid colon    E. 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   Left Forehead (treatment after biopsy)   OSA on CPAP    Perianal rash    Recurrent epistaxis    Renal cyst 01/04/2013   Small left peripelvic renal cysts , noted on US Renal   SCCA (squamous cell carcinoma) of skin 10/17/2015   Left Sup Bridge of Nose (curet, cautery and 5FU)   Seasonal allergies    Superficial basal cell carcinoma (BCC) 10/17/2015   Left Bulb of Nose (curet, cautery and 5FU)   Tubular adenoma     Patient Active Problem List   Diagnosis Date Noted   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   Perianal rash 08/06/2017   Atrial fibrillation ([place]) 07/08/2016   Recurrent epistaxis 07/08/2016   Deviated nasal  septum 07/08/2016   Cecal angiodysplasia 05/28/2016   Iron deficiency anemia due to chronic blood loss 04/23/2016   Heme positive stool 04/23/2016   Chronic anticoagulation 04/23/2016   BARRETTS ESOPHAGUS 04/23/2009   History of colonic polyps 04/23/2009    Past Surgical History:  Procedure Laterality Date   COLONOSCOPY  multiple   CYSTOSCOPY     ESOPHAGOGASTRODUODENOSCOPY  multiple   EVALUATION UNDER ANESTHESIA WITH FISTULECTOMY N/A 03/02/2018   Procedure: ANORECTAL EXAM UNDER ANESTHESIA WITH REPAIR OF SUPERFICIAL PERIRECTAL FISTULA AND HEMORRHOIDECTOMY;  Surgeon: Michael Boston, MD;  Location: [facility];  Service: General;  Laterality: N/A;   EXPLORATORY LAPAROTOMY         Home Medications    Prior to Admission medications   Medication Sig Start Date End Date Taking? Authorizing Provider  cephALEXin (KEFLEX) 500 MG capsule Take 1 capsule (500 mg total) by mouth 4 (four) times daily for 5 days. 08/19/21 08/24/21 Yes , Michele Rockers, FNP  apixaban (ELIQUIS) 5 MG TABS tablet Take 5 mg by mouth 2 (two) times daily.    [provider]  Cholecalciferol (VITAMIN D3) 5000 units CAPS Take 1 capsule by mouth daily.     [provider]  dapagliflozin propanediol (FARXIGA) 10 MG TABS tablet Take 10 mg by  mouth daily. 05/29/20   [provider]  Fe Fum-Fe Poly-Vit C-Lactobac (FUSION) 65-65-25-30 MG CAPS Take by mouth.    [provider]  fexofenadine (ALLEGRA) 180 MG tablet Take 180 mg by mouth as needed for allergies or rhinitis.    [provider]  furosemide (LASIX) 20 MG tablet Take 20 mg by mouth daily.  02/04/16   [provider]  Glucosamine-Chondroit-Vit C-Mn (GLUCOSAMINE CHONDR 500 COMPLEX PO) Take 1 tablet by mouth daily.     [provider]  ketoconazole (NIZORAL) 2 % cream APPLY TO AFFECTED AREA TWICE A DAY FOR 14 DAYS    [provider]  levothyroxine (SYNTHROID, LEVOTHROID) 112 MCG tablet Take 112 mcg by mouth daily  before breakfast.  02/11/16   [provider]  losartan (COZAAR) 25 MG tablet Take 25 mg by mouth daily.  02/05/16   [provider]  metoprolol succinate (TOPROL-XL) 25 MG 24 hr tablet TAKE 1 TABLET (25 MG TOTAL) BY MOUTH DAILY. TAKE WITH OR IMMEDIATELY FOLLOWING A MEAL. 05/06/21 05/01/22  Adrian Prows, MD  nitroGLYCERIN (NITROSTAT) 0.4 MG SL tablet Place 1 tablet (0.4 mg total) under the tongue every 5 (five) minutes as needed for up to 25 days for chest pain. 01/31/20 06/03/21  Adrian Prows, MD  omeprazole (PRILOSEC) 20 MG capsule Take 20 mg by mouth daily.  03/17/16   [provider]  rosuvastatin (CRESTOR) 5 MG tablet TAKE 1 TABLET (5 MG TOTAL) BY MOUTH DAILY. 06/07/21 09/05/21  Cantwell, Celeste C, PA-C  sodium chloride (OCEAN) 0.65 % SOLN nasal spray Place 1 spray into both nostrils as needed for congestion.    [provider]    Family History Family History  Problem Relation Age of Onset   Heart disease Mother    Other Father        old age   Ovarian cancer Sister    Colon cancer Neg Hx    Stomach cancer Neg Hx    Rectal cancer Neg Hx    Esophageal cancer Neg Hx    Liver cancer Neg Hx     Social History Social History   Tobacco Use   Smoking status: Former    Packs/day: 0.50    Years: 40.00    Total pack years: 20.00    Types: Cigarettes    Quit date: 2006    Years since quitting: 17.5   Smokeless tobacco: Never  Vaping Use   Vaping Use: Never used  Substance Use Topics   Alcohol use: Yes    Comment: occasional wine   Drug use: No     Allergies   Patient has no known allergies.   Review of Systems Review of Systems Per HPI  Physical Exam Triage Vital Signs ED Triage Vitals  Enc Vitals Group     BP 08/19/21 1625 (!) 128/54     Pulse Rate 08/19/21 1625 60     Resp 08/19/21 1625 17     Temp 08/19/21 1625 97.7 F (36.5 C)     Temp src --      SpO2 08/19/21 1625 95 %     Weight --      Height --      Head Circumference --       Peak Flow --      Pain Score 08/19/21 1624 0     Pain Loc --      Pain Edu? --      Excl. in [place]? --  No data found.  Updated Vital Signs BP (!) 128/54   Pulse 60   Temp 97.7 F (36.5 C)   Resp 17   SpO2 95%   Visual Acuity Right Eye Distance:   Left Eye Distance:   Bilateral Distance:    Right Eye Near:   Left Eye Near:    Bilateral Near:     Physical Exam Constitutional:      General: He is not in acute distress.    Appearance: Normal appearance. He is not toxic-appearing or diaphoretic.  HENT:     Head: Normocephalic and atraumatic.  Eyes:     Extraocular Movements: Extraocular movements intact.     Conjunctiva/sclera: Conjunctivae normal.  Pulmonary:     Effort: Pulmonary effort is normal.  Skin:    Comments: There are 2 erythematous, circular areas that are approximately 1.5 to 2 cm in diameter present to right forearm/elbow area.  No purulent drainage noted but there is surrounding yellow discoloration.  There is also surrounding erythema and mild swelling noted.  Patient has full range of motion of arm.  Grip strength 5/5.  Neurovascular intact.   Neurological:     General: No focal deficit present.     Mental Status: He is alert and oriented to person, place, and time. Mental status is at baseline.  Psychiatric:        Mood and Affect: Mood normal.        Behavior: Behavior normal.        Thought Content: Thought content normal.        Judgment: Judgment normal.      UC Treatments / Results  Labs (all labs ordered are listed, but only abnormal results are displayed) Labs Reviewed - No data to display  EKG   Radiology No results found.  Procedures Procedures (including critical care time)  Medications Ordered in UC Medications - No data to display  Initial Impression / Assessment and Plan / UC Course  I have reviewed the triage vital signs and the nursing notes.  Pertinent labs & imaging results that were available during my care of the  patient were reviewed by me and considered in my medical decision making (see chart for details).     Patient has abrasion to right forearm.  It does appear to be infected.  Will treat with cephalexin antibiotic.  Creatinine clearance appears to be 39 so no dosage adjustment necessary.  Wound cleansed and nonadherent dressing applied.  Advised patient to keep covered until healed over.  Tetanus vaccine appears to be up-to-date.  Discussed wound care with patient.  Discussed return precautions.  Patient verbalized understanding and was agreeable with plan. Final Clinical Impressions(s) / UC Diagnoses   Final diagnoses:  Cellulitis of right forearm  Abrasion of right forearm, initial encounter     Discharge Instructions      The abrasion to your arm appears infected.  You have been prescribed antibiotic to help treat this.  Please keep covered until healed over.  Follow-up if symptoms persist or worsen.    ED Prescriptions     Medication Sig Dispense Auth. Provider   cephALEXin (KEFLEX) 500 MG capsule Take 1 capsule (500 mg total) by mouth 4 (four) times daily for 5 days. 20 capsule Teodora Medici, [place]      PDMP not reviewed this encounter.   Teodora Medici, [place] 08/19/21 [phone]

## 2021-08-20 DIAGNOSIS — E1122 Type 2 diabetes mellitus with diabetic chronic kidney disease: Secondary | ICD-10-CM | POA: Diagnosis not present

## 2021-08-20 DIAGNOSIS — I4821 Permanent atrial fibrillation: Secondary | ICD-10-CM | POA: Diagnosis not present

## 2021-08-20 DIAGNOSIS — D500 Iron deficiency anemia secondary to blood loss (chronic): Secondary | ICD-10-CM | POA: Diagnosis not present

## 2021-08-20 DIAGNOSIS — N1832 Chronic kidney disease, stage 3b: Secondary | ICD-10-CM | POA: Diagnosis not present

## 2021-08-20 DIAGNOSIS — I10 Essential (primary) hypertension: Secondary | ICD-10-CM | POA: Diagnosis not present

## 2021-08-20 DIAGNOSIS — D692 Other nonthrombocytopenic purpura: Secondary | ICD-10-CM | POA: Diagnosis not present

## 2021-08-20 DIAGNOSIS — E039 Hypothyroidism, unspecified: Secondary | ICD-10-CM | POA: Diagnosis not present

## 2021-08-20 DIAGNOSIS — D6869 Other thrombophilia: Secondary | ICD-10-CM | POA: Diagnosis not present

## 2021-08-20 DIAGNOSIS — Z23 Encounter for immunization: Secondary | ICD-10-CM | POA: Diagnosis not present

## 2021-11-27 DIAGNOSIS — E039 Hypothyroidism, unspecified: Secondary | ICD-10-CM | POA: Diagnosis not present

## 2021-11-27 DIAGNOSIS — I4821 Permanent atrial fibrillation: Secondary | ICD-10-CM | POA: Diagnosis not present

## 2021-11-27 DIAGNOSIS — E1122 Type 2 diabetes mellitus with diabetic chronic kidney disease: Secondary | ICD-10-CM | POA: Diagnosis not present

## 2021-11-27 DIAGNOSIS — N1832 Chronic kidney disease, stage 3b: Secondary | ICD-10-CM | POA: Diagnosis not present

## 2021-11-27 DIAGNOSIS — D500 Iron deficiency anemia secondary to blood loss (chronic): Secondary | ICD-10-CM | POA: Diagnosis not present

## 2021-11-27 DIAGNOSIS — D6869 Other thrombophilia: Secondary | ICD-10-CM | POA: Diagnosis not present

## 2021-11-27 DIAGNOSIS — D692 Other nonthrombocytopenic purpura: Secondary | ICD-10-CM | POA: Diagnosis not present

## 2021-11-27 DIAGNOSIS — I10 Essential (primary) hypertension: Secondary | ICD-10-CM | POA: Diagnosis not present

## 2021-12-04 ENCOUNTER — Other Ambulatory Visit (HOSPITAL_COMMUNITY): Payer: Self-pay | Admitting: Internal Medicine

## 2021-12-04 DIAGNOSIS — I10 Essential (primary) hypertension: Secondary | ICD-10-CM | POA: Diagnosis not present

## 2021-12-04 DIAGNOSIS — Z0000 Encounter for general adult medical examination without abnormal findings: Secondary | ICD-10-CM | POA: Diagnosis not present

## 2021-12-04 DIAGNOSIS — R634 Abnormal weight loss: Secondary | ICD-10-CM

## 2021-12-04 DIAGNOSIS — D692 Other nonthrombocytopenic purpura: Secondary | ICD-10-CM | POA: Diagnosis not present

## 2021-12-04 DIAGNOSIS — N1832 Chronic kidney disease, stage 3b: Secondary | ICD-10-CM | POA: Diagnosis not present

## 2021-12-04 DIAGNOSIS — I4821 Permanent atrial fibrillation: Secondary | ICD-10-CM | POA: Diagnosis not present

## 2021-12-04 DIAGNOSIS — R7989 Other specified abnormal findings of blood chemistry: Secondary | ICD-10-CM

## 2021-12-04 DIAGNOSIS — R5383 Other fatigue: Secondary | ICD-10-CM | POA: Diagnosis not present

## 2021-12-04 DIAGNOSIS — Z1159 Encounter for screening for other viral diseases: Secondary | ICD-10-CM | POA: Diagnosis not present

## 2021-12-04 DIAGNOSIS — E039 Hypothyroidism, unspecified: Secondary | ICD-10-CM | POA: Diagnosis not present

## 2021-12-04 DIAGNOSIS — R17 Unspecified jaundice: Secondary | ICD-10-CM

## 2021-12-04 DIAGNOSIS — R748 Abnormal levels of other serum enzymes: Secondary | ICD-10-CM | POA: Diagnosis not present

## 2021-12-04 DIAGNOSIS — R600 Localized edema: Secondary | ICD-10-CM | POA: Diagnosis not present

## 2021-12-04 DIAGNOSIS — G4733 Obstructive sleep apnea (adult) (pediatric): Secondary | ICD-10-CM | POA: Diagnosis not present

## 2021-12-04 DIAGNOSIS — E1122 Type 2 diabetes mellitus with diabetic chronic kidney disease: Secondary | ICD-10-CM | POA: Diagnosis not present

## 2021-12-06 ENCOUNTER — Ambulatory Visit (HOSPITAL_COMMUNITY)
Admission: RE | Admit: 2021-12-06 | Discharge: 2021-12-06 | Disposition: A | Discharge status: Home / Self Care | Payer: PPO | Source: Ambulatory Visit | Attending: Internal Medicine | Admitting: Internal Medicine

## 2021-12-06 DIAGNOSIS — N281 Cyst of kidney, acquired: Secondary | ICD-10-CM | POA: Diagnosis not present

## 2021-12-06 DIAGNOSIS — K429 Umbilical hernia without obstruction or gangrene: Secondary | ICD-10-CM | POA: Diagnosis not present

## 2021-12-06 DIAGNOSIS — K828 Other specified diseases of gallbladder: Secondary | ICD-10-CM | POA: Diagnosis not present

## 2021-12-06 DIAGNOSIS — R7989 Other specified abnormal findings of blood chemistry: Secondary | ICD-10-CM | POA: Insufficient documentation

## 2021-12-06 DIAGNOSIS — R17 Unspecified jaundice: Secondary | ICD-10-CM | POA: Insufficient documentation

## 2021-12-06 DIAGNOSIS — R634 Abnormal weight loss: Secondary | ICD-10-CM | POA: Diagnosis not present

## 2021-12-06 DIAGNOSIS — R748 Abnormal levels of other serum enzymes: Secondary | ICD-10-CM | POA: Diagnosis not present

## 2021-12-06 MED ORDER — IOHEXOL 350 MG/ML SOLN
75.0000 mL | Freq: Once | INTRAVENOUS | Status: AC | PRN
  Administered 2021-12-06: 75 mL via INTRAVENOUS

## 2021-12-10 ENCOUNTER — Ambulatory Visit: Payer: PPO | Admitting: Physician Assistant

## 2021-12-10 ENCOUNTER — Other Ambulatory Visit (INDEPENDENT_AMBULATORY_CARE_PROVIDER_SITE_OTHER): Payer: PPO

## 2021-12-10 ENCOUNTER — Encounter: Payer: Self-pay | Admitting: Physician Assistant

## 2021-12-10 VITALS — BP 90/56 | HR 59 | Ht 68.0 in | Wt 222.5 lb

## 2021-12-10 DIAGNOSIS — R634 Abnormal weight loss: Secondary | ICD-10-CM

## 2021-12-10 DIAGNOSIS — R935 Abnormal findings on diagnostic imaging of other abdominal regions, including retroperitoneum: Secondary | ICD-10-CM

## 2021-12-10 DIAGNOSIS — R7989 Other specified abnormal findings of blood chemistry: Secondary | ICD-10-CM | POA: Diagnosis not present

## 2021-12-10 LAB — CBC WITH DIFFERENTIAL/PLATELET
Basophils Absolute: 0 10*3/uL (ref 0.0–0.1)
Basophils Relative: 0.8 % (ref 0.0–3.0)
Eosinophils Absolute: 0.2 10*3/uL (ref 0.0–0.7)
Eosinophils Relative: 4 % (ref 0.0–5.0)
HCT: 31 % — ABNORMAL LOW (ref 39.0–52.0)
Hemoglobin: 10.4 g/dL — ABNORMAL LOW (ref 13.0–17.0)
Lymphocytes Relative: 29.8 % (ref 12.0–46.0)
Lymphs Abs: 1.8 10*3/uL (ref 0.7–4.0)
MCHC: 33.4 g/dL (ref 30.0–36.0)
MCV: 108.6 fl — ABNORMAL HIGH (ref 78.0–100.0)
Monocytes Absolute: 0.6 10*3/uL (ref 0.1–1.0)
Monocytes Relative: 9.6 % (ref 3.0–12.0)
Neutro Abs: 3.4 10*3/uL (ref 1.4–7.7)
Neutrophils Relative %: 55.8 % (ref 43.0–77.0)
Platelets: 135 10*3/uL — ABNORMAL LOW (ref 150.0–400.0)
RBC: 2.86 Mil/uL — ABNORMAL LOW (ref 4.22–5.81)
RDW: 16.6 % — ABNORMAL HIGH (ref 11.5–15.5)
WBC: 6.1 10*3/uL (ref 4.0–10.5)

## 2021-12-10 LAB — COMPREHENSIVE METABOLIC PANEL
ALT: 28 U/L (ref 0–53)
AST: 38 U/L — ABNORMAL HIGH (ref 0–37)
Albumin: 3.3 g/dL — ABNORMAL LOW (ref 3.5–5.2)
Alkaline Phosphatase: 157 U/L — ABNORMAL HIGH (ref 39–117)
BUN: 21 mg/dL (ref 6–23)
CO2: 26 mEq/L (ref 19–32)
Calcium: 9 mg/dL (ref 8.4–10.5)
Chloride: 107 mEq/L (ref 96–112)
Creatinine, Ser: 1.56 mg/dL — ABNORMAL HIGH (ref 0.40–1.50)
GFR: 39.4 mL/min — ABNORMAL LOW (ref >60.00)
Glucose, Bld: 150 mg/dL — ABNORMAL HIGH (ref 70–99)
Potassium: 4.8 mEq/L (ref 3.5–5.1)
Sodium: 138 mEq/L (ref 135–145)
Total Bilirubin: 1.4 mg/dL — ABNORMAL HIGH (ref 0.2–1.2)
Total Protein: 6.8 g/dL (ref 6.0–8.3)

## 2021-12-10 LAB — PROTIME-INR
INR: 1.3 ratio — ABNORMAL HIGH (ref 0.8–1.0)
Prothrombin Time: 14.3 s — ABNORMAL HIGH (ref 9.6–13.1)

## 2021-12-10 NOTE — Progress Notes (Addendum)
Chief Complaint: Abnormal CT and labs  HPI:    [name] is a 88 year old male with a past medical history as listed below including A-fib on Eliquis, Barrett's esophagus, diverticulosis, GERD and multiple others, known to Carl Woods, who was referred to me by Carl Seashore, MD for a complaint of abnormal CT of the abdomen and elevated LFTs.      05/28/2016 EGD was normal.    05/28/2016 colonoscopy with a single nonbleeding angiodysplastic lesion clipped, diverticulosis in the sigmoid colon and one 4 mm polyp in the sigmoid colon.  No repeat recommended due to age.    We do not have labs at time of patient's visit.    11/13/2017 office visit with Carl Woods for perianal rash.  At that time rash was nonhealing and he was scheduled for an MRI of the pelvis to rule out fistula.    12/06/2021 CTAP with contrast showed moderate intrahepatic bile duct dilation with marked fusiform dilation of the common bile duct, no CT visible CBD stone, contour of the liver appeared nodular which may reflect underlying cirrhosis, age-indeterminate compression fracture involving L1 vertebral body.  Fat-containing umbilical hernia, 5 mm anterior listhesis of L4 on L5 and aortic atherosclerosis.    Today, the patient tells me that he was sent here due to an abnormal CT of his abdomen and some elevated LFTs.  Apparently he had labs done about 2 weeks ago and then had CT done last Friday.  Tells me that he has noticed some various symptoms including weight loss of about 40 pounds over the past year and a half without trying, some intermittent right lower quadrant pain which is sharp and he related to gas about 2-3 times over the past month, change in bowel habits with some looser stools than normal and an episode of incontinence as well as some reflux and belching.  Denies any previous history of known liver problems, denies alcohol use, family history of liver disease or tattoos.    Denies fever, chills, blood in the  stool or symptoms that awaken him from sleep.  Past Medical History:  Diagnosis Date   Anal fistula    Arthritis    Fingers and hands   Atrial fibrillation ([place])    AVM (arteriovenous malformation)    Clipped during Colonoscopy 05/2016   Barrett's esophagus    Bilateral cataracts    BPH (benign prostatic hyperplasia)    Cecal angiodysplasia 05/28/2016   ablated at colonoscopy   Deviated nasal septum    Diverticulosis of sigmoid colon    E. 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   Left Forehead (treatment after biopsy)   OSA on CPAP    Perianal rash    Recurrent epistaxis    Renal cyst 01/04/2013   Small left peripelvic renal cysts , noted on US Renal   SCCA (squamous cell carcinoma) of skin 10/17/2015   Left Sup Bridge of Nose (curet, cautery and 5FU)   Seasonal allergies    Superficial basal cell carcinoma (BCC) 10/17/2015   Left Bulb of Nose (curet, cautery and 5FU)   Tubular adenoma     Past Surgical History:  Procedure Laterality Date   COLONOSCOPY  multiple   CYSTOSCOPY     ESOPHAGOGASTRODUODENOSCOPY  multiple   EVALUATION UNDER ANESTHESIA WITH FISTULECTOMY  N/A 03/02/2018   Procedure: ANORECTAL EXAM UNDER ANESTHESIA WITH REPAIR OF SUPERFICIAL PERIRECTAL FISTULA AND HEMORRHOIDECTOMY;  Surgeon: Carl Boston, MD;  Location: [facility];  Service: General;  Laterality: N/A;   EXPLORATORY LAPAROTOMY      Current Outpatient Medications  Medication Sig Dispense Refill   apixaban (ELIQUIS) 5 MG TABS tablet Take 5 mg by mouth 2 (two) times daily.     Cholecalciferol (VITAMIN D3) 5000 units CAPS Take 1 capsule by mouth daily.      dapagliflozin propanediol (FARXIGA) 10 MG TABS tablet Take 10 mg by mouth daily.     Fe Fum-Fe Poly-Vit C-Lactobac (FUSION) 65-65-25-30 MG CAPS Take by mouth.     fexofenadine (ALLEGRA) 180 MG  tablet Take 180 mg by mouth as needed for allergies or rhinitis.     furosemide (LASIX) 20 MG tablet Take 20 mg by mouth daily.      Glucosamine-Chondroit-Vit C-Mn (GLUCOSAMINE CHONDR 500 COMPLEX PO) Take 1 tablet by mouth daily.      ketoconazole (NIZORAL) 2 % cream APPLY TO AFFECTED AREA TWICE A DAY FOR 14 DAYS     levothyroxine (SYNTHROID, LEVOTHROID) 112 MCG tablet Take 112 mcg by mouth daily before breakfast.      losartan (COZAAR) 25 MG tablet Take 25 mg by mouth daily.      metoprolol succinate (TOPROL-XL) 25 MG 24 hr tablet TAKE 1 TABLET (25 MG TOTAL) BY MOUTH DAILY. TAKE WITH OR IMMEDIATELY FOLLOWING A MEAL. 90 tablet 3   omeprazole (PRILOSEC) 20 MG capsule Take 20 mg by mouth daily.      sodium chloride (OCEAN) 0.65 % SOLN nasal spray Place 1 spray into both nostrils as needed for congestion.     nitroGLYCERIN (NITROSTAT) 0.4 MG SL tablet Place 1 tablet (0.4 mg total) under the tongue every 5 (five) minutes as needed for up to 25 days for chest pain. 25 tablet 3   rosuvastatin (CRESTOR) 5 MG tablet TAKE 1 TABLET (5 MG TOTAL) BY MOUTH DAILY. 90 tablet 0   No current facility-administered medications for this visit.    Allergies as of 12/10/2021   (No Known Allergies)    Family History  Problem Relation Age of Onset   Heart disease Mother    Other Father        old age   Ovarian cancer Sister    Colon cancer Neg Hx    Stomach cancer Neg Hx    Rectal cancer Neg Hx    Esophageal cancer Neg Hx    Liver cancer Neg Hx     Social History   Socioeconomic History   Marital status: Married    Spouse name: Not on file   Number of children: 3   Years of education: Not on file   Highest education level: Not on file  Occupational History   Occupation: retired  Tobacco Use   Smoking status: Former    Packs/day: 0.50    Years: 40.00    Total pack years: 20.00    Types: Cigarettes    Quit date: 2006    Years since quitting: 17.8   Smokeless tobacco: Never  Vaping Use    Vaping Use: Never used  Substance and Sexual Activity   Alcohol use: Yes    Comment: occasional wine   Drug use: No   Sexual activity: Not on file  Other Topics Concern   Not on file  Social History Narrative   Not on file   Social Determinants of Health  Financial Resource Strain: Not on file  Food Insecurity: Not on file  Transportation Needs: Not on file  Physical Activity: Not on file  Stress: Not on file  Social Connections: Not on file  Intimate Partner Violence: Not on file    Review of Systems:    Constitutional: No weight loss, fever or chills Skin: No rash  Cardiovascular: No chest pain Respiratory: No SOB Gastrointestinal: See HPI and otherwise negative Genitourinary: No dysuria Neurological: No headache, dizziness or syncope Musculoskeletal: No new muscle or joint pain Hematologic: No bleeding  Psychiatric: No history of depression or anxiety   Physical Exam:  Vital signs: BP (!) 90/56   Pulse (!) 59   Ht _0  (1.727 m)   Wt 222 lb 8 oz (100.9 kg)   BMI 33.83 kg/m   Constitutional:   Pleasant Elderly Caucasian male appears to be in NAD, Well developed, Well nourished, alert and cooperative Head:  Normocephalic and atraumatic. Eyes:   PEERL, EOMI. No icterus. Conjunctiva pink. Ears:  Normal auditory acuity. Neck:  Supple Throat: Oral cavity and pharynx without inflammation, swelling or lesion.  Respiratory: Respirations even and unlabored. Lungs clear to auscultation bilaterally.   No wheezes, crackles, or rhonchi.  Cardiovascular: Normal S1, S2. No MRG. Regular rate and rhythm. No peripheral edema, cyanosis or pallor.  Gastrointestinal:  Soft, nondistended, nontender. No rebound or guarding. Normal bowel sounds. No appreciable masses or hepatomegaly. Rectal:  Not performed.  Msk:  Symmetrical without gross deformities. Without edema, no deformity or joint abnormality.  Neurologic:  Alert and  oriented x4;  grossly normal neurologically.  Skin:    Dry and intact without significant lesions or rashes. Psychiatric: Demonstrates good judgement and reason without abnormal affect or behaviors.  Requesting recent labs.  Assessment: 1.  Abnormal CT of the abdomen: Showing questionable cirrhosis and intrahepatic bile duct dilation, recommendations for MRI for further eval 2.  Weight loss: Patient reports a 40 pound weight loss over the past year and a half without trying 3.  Elevated LFTs: Per patient liver enzymes were elevated recently, we are requesting labs   Plan: 1.  Ordered MRCP for later this week for further evaluation of abnormal bile duct. 2.  Discussed possibility of ERCP versus EUS versus other with the patient today.  Answered questions. 3.  Ordered repeat labs including CBC, CMP and PT/INR for trending. 4.  Requesting most recent labs from PCP. 5.  Patient to follow in clinic per recommendations after labs and imaging above. 6.  If necessary may need to do further liver serologies given question of cirrhosis.  Ellouise Newer, PA-C [facility] 12/10/2021, 9:18 AM  Cc: Carl Seashore, MD   Addendum: 12/16/2021 10:17 AM  12/04/2021 alk phos 203 high, alk phos liver 152 high, alk phos bone 51.  Amylase normal at 81.  Direct bilirubin elevated 1.2.  Indirect bilirubin normal at 0.7, total bilirubin high at 1.9.  CMP with chloride high at 108, glucose 167, BUN 25, creatinine 1.55, alk phos 209, AST 62 and total bili 1.7, ALT 59 otherwise normal.  Hepatitis A, B, and C virus nonreactive.  11/27/2021 hemoglobin 10.9, platelets low at 137  08/13/2021 alk phos normal at 91, ALT 22, AST 21, total bili 0.3.  We will call and let patient know that the liver enzyme that we rechecked on 11/14 and actually come down some.  We will await results from MRI.  Ellouise Newer, PA-C

## 2021-12-10 NOTE — Patient Instructions (Signed)
_______________________________________________________  If you are age 86 or older, your body mass index should be between 23-30. Your Body mass index is 33.83 kg/m. If this is out of the aforementioned range listed, please consider follow up with your Primary Care Provider.  If you are age 77 or younger, your body mass index should be between 19-25. Your Body mass index is 33.83 kg/m. If this is out of the aformentioned range listed, please consider follow up with your Primary Care Provider.   ________________________________________________________  The [facility] providers would like to encourage you to use [facility] to communicate with providers for non-urgent requests or questions.  Due to long hold times on the telephone, sending your provider a message by [facility] may be a faster and more efficient way to get a response.  Please allow 48 business hours for a response.  Please remember that this is for non-urgent requests.  _______________________________________________________  Your provider has requested that you go to the basement level for lab work before leaving today. Press "B" on the elevator. The lab is located at the first door on the left as you exit the elevator.  You have been scheduled for an MRI at [facility] on 12/16/2021. Your appointment time is 5:00pm. Please arrive to admitting (at main entrance of the hospital) 30 minutes prior to your appointment time for registration purposes. Please make certain not to have anything to eat or drink 4 hours prior to your test. In addition, if you have any metal in your body, have a pacemaker or defibrillator, please be sure to let your ordering physician know. This test typically takes 45 minutes to 1 hour to complete. Should you need to reschedule, please call [phone] to do so.

## 2021-12-16 ENCOUNTER — Ambulatory Visit (HOSPITAL_COMMUNITY)
Admission: RE | Admit: 2021-12-16 | Discharge: 2021-12-16 | Disposition: A | Discharge status: Home / Self Care | Payer: PPO | Source: Ambulatory Visit | Attending: Physician Assistant | Admitting: Physician Assistant

## 2021-12-16 ENCOUNTER — Telehealth: Payer: Self-pay

## 2021-12-16 ENCOUNTER — Other Ambulatory Visit: Payer: Self-pay | Admitting: Physician Assistant

## 2021-12-16 DIAGNOSIS — K831 Obstruction of bile duct: Secondary | ICD-10-CM

## 2021-12-16 DIAGNOSIS — K862 Cyst of pancreas: Secondary | ICD-10-CM | POA: Diagnosis not present

## 2021-12-16 DIAGNOSIS — K429 Umbilical hernia without obstruction or gangrene: Secondary | ICD-10-CM | POA: Diagnosis not present

## 2021-12-16 DIAGNOSIS — R634 Abnormal weight loss: Secondary | ICD-10-CM | POA: Insufficient documentation

## 2021-12-16 DIAGNOSIS — R7989 Other specified abnormal findings of blood chemistry: Secondary | ICD-10-CM

## 2021-12-16 DIAGNOSIS — R935 Abnormal findings on diagnostic imaging of other abdominal regions, including retroperitoneum: Secondary | ICD-10-CM | POA: Diagnosis not present

## 2021-12-16 DIAGNOSIS — N281 Cyst of kidney, acquired: Secondary | ICD-10-CM | POA: Diagnosis not present

## 2021-12-16 DIAGNOSIS — K838 Other specified diseases of biliary tract: Secondary | ICD-10-CM | POA: Diagnosis not present

## 2021-12-16 MED ORDER — GADOBUTROL 1 MMOL/ML IV SOLN
10.0000 mL | Freq: Once | INTRAVENOUS | Status: AC | PRN
  Administered 2021-12-16: 10 mL via INTRAVENOUS

## 2021-12-16 NOTE — Telephone Encounter (Signed)
-----   Message from Levin Erp, [place] sent at 12/16/2021 10:24 AM EST ----- Regarding: Please call patient Please call patient and let him know that I got his labs from his PCP and his liver enzymes actually trended down when we rechecked them.  We will still see what the MRI shows.  Thanks, JLL

## 2021-12-16 NOTE — Telephone Encounter (Unsigned)
Left message for pt to call back 12/16/21  Spoke with pt and he is aware.

## 2021-12-24 NOTE — Telephone Encounter (Signed)
Inbound cal from patient in regards to results. Please advise.

## 2021-12-24 NOTE — Telephone Encounter (Signed)
Left message on machine to call back  

## 2021-12-25 ENCOUNTER — Other Ambulatory Visit (INDEPENDENT_AMBULATORY_CARE_PROVIDER_SITE_OTHER): Payer: PPO

## 2021-12-25 ENCOUNTER — Other Ambulatory Visit: Payer: Self-pay

## 2021-12-25 DIAGNOSIS — K831 Obstruction of bile duct: Secondary | ICD-10-CM

## 2021-12-25 DIAGNOSIS — R935 Abnormal findings on diagnostic imaging of other abdominal regions, including retroperitoneum: Secondary | ICD-10-CM

## 2021-12-25 DIAGNOSIS — R7989 Other specified abnormal findings of blood chemistry: Secondary | ICD-10-CM

## 2021-12-25 DIAGNOSIS — C241 Malignant neoplasm of ampulla of Vater: Secondary | ICD-10-CM | POA: Diagnosis not present

## 2021-12-25 LAB — COMPREHENSIVE METABOLIC PANEL
ALT: 22 U/L (ref 0–53)
AST: 33 U/L (ref 0–37)
Albumin: 3.4 g/dL — ABNORMAL LOW (ref 3.5–5.2)
Alkaline Phosphatase: 110 U/L (ref 39–117)
BUN: 24 mg/dL — ABNORMAL HIGH (ref 6–23)
CO2: 27 mEq/L (ref 19–32)
Calcium: 9 mg/dL (ref 8.4–10.5)
Chloride: 110 mEq/L (ref 96–112)
Creatinine, Ser: 1.66 mg/dL — ABNORMAL HIGH (ref 0.40–1.50)
GFR: 36.56 mL/min — ABNORMAL LOW (ref >60.00)
Glucose, Bld: 122 mg/dL — ABNORMAL HIGH (ref 70–99)
Potassium: 4.7 mEq/L (ref 3.5–5.1)
Sodium: 143 mEq/L (ref 135–145)
Total Bilirubin: 1 mg/dL (ref 0.2–1.2)
Total Protein: 6.8 g/dL (ref 6.0–8.3)

## 2021-12-25 LAB — CBC
HCT: 31.6 % — ABNORMAL LOW (ref 39.0–52.0)
Hemoglobin: 10.6 g/dL — ABNORMAL LOW (ref 13.0–17.0)
MCHC: 33.4 g/dL (ref 30.0–36.0)
MCV: 109 fl — ABNORMAL HIGH (ref 78.0–100.0)
Platelets: 158 10*3/uL (ref 150.0–400.0)
RBC: 2.9 Mil/uL — ABNORMAL LOW (ref 4.22–5.81)
RDW: 16.6 % — ABNORMAL HIGH (ref 11.5–15.5)
WBC: 5.8 10*3/uL (ref 4.0–10.5)

## 2021-12-25 NOTE — Addendum Note (Signed)
Addended by: Gatha Mayer on: 12/25/2021 09:49 AM   Modules accepted: Orders

## 2021-12-25 NOTE — Telephone Encounter (Signed)
December 28 will have to be okay for now and we will see how it goes.  He is coming for labs today or tomorrow and I will regroup after I see those.

## 2021-12-25 NOTE — Telephone Encounter (Signed)
Dr Carlean Purl the first available with [facility] is 12/28. Is this ok?

## 2021-12-25 NOTE — Telephone Encounter (Addendum)
I spoke to him over the phone and outlined that he has biliary obstruction and pancreatic duct dilation/obstruction and we are concerned about a tumor though we do not see one specifically, cysts not withstanding.   I have ordered CBC CMET and CA 19-9 and he will come today or tomorrow for those.  He has had some transient pruritus but that resolved there was a rash associated so probably not from bile duct obstruction.  No pain or fever or obvious jaundice at this time, question mild steatorrhea   Patty,  I am unable to schedule an ERCP for him in December.  Dr. Rush Landmark has indicated he could do EUS/ERCP in December so please schedule for him.  I have explained the basics of this to the patient and that we would be contacting him.  He is on Eliquis for A-fib Dr. Einar Gip is his cardiologist he will need to hold that for 2 days.   Thanks

## 2021-12-25 NOTE — Telephone Encounter (Signed)
Please schedule next available with me. If his LFTs are significantly elevated, Dr. Carlean Purl will have to decide, if he can wait until my next available or be admitted to the hospital. Thanks. GM

## 2021-12-25 NOTE — Telephone Encounter (Signed)
The pt has been scheduled for 01/23/22 at 2 pm at [facility] with [facility]  Eliquis hold for 2 days  Left message on machine to call back

## 2021-12-25 NOTE — Telephone Encounter (Signed)
Thank you Carl Woods for scheduling. Hopefully we can keep him until that particular date. We will see what the LFTs look like. And he should have repeat LFTs 1 week prior to my procedure as well, if he remains an outpatient. Thanks. GM

## 2021-12-25 NOTE — Telephone Encounter (Signed)
Please advise Dr Rush Landmark

## 2021-12-25 NOTE — Telephone Encounter (Signed)
Pt is calling for his MRI results. Looks like Carl Woods was questioning an ERCP. Please advise.

## 2021-12-25 NOTE — Telephone Encounter (Signed)
ERCP scheduled, pt instructed and medications reviewed.  Patient instructions mailed to home.  Patient to call with any questions or concerns. Will await ok to hold Eliquis 2 days-- pt to call if he has not heard from our office at least 2 weeks prior.

## 2021-12-26 ENCOUNTER — Other Ambulatory Visit: Payer: Self-pay

## 2021-12-26 ENCOUNTER — Telehealth: Payer: Self-pay

## 2021-12-26 DIAGNOSIS — K831 Obstruction of bile duct: Secondary | ICD-10-CM

## 2021-12-26 LAB — CANCER ANTIGEN 19-9: CA 19-9: 15 U/mL (ref <34)

## 2021-12-26 NOTE — Telephone Encounter (Signed)
The pt has been advised of the Eliquis hold for 2 days prior.  The pt has been advised of the information and verbalized understanding.

## 2021-12-26 NOTE — Telephone Encounter (Signed)
-----   Message from Adrian Prows, MD sent at 12/25/2021 10:01 PM EST ----- Regarding: Anticoaulation hold for endoscopy He has low CHADsVasc 2 risk score of 3.  Hold Eliquis for 2 days and if no biopsy can restart same day ortherwise in 3-5 days  Low risk procedure from cardiac standpoint.   Adrian Prows, MD, [facility] 12/25/2021, 10:02 PM Office: [phone] Fax: [phone] Pager: [phone]  ----- Message ----- From: Timothy Lasso, RN Sent: 12/25/2021  12:26 PM EST To: Adrian Prows, MD

## 2022-01-07 DIAGNOSIS — H43813 Vitreous degeneration, bilateral: Secondary | ICD-10-CM | POA: Diagnosis not present

## 2022-01-07 DIAGNOSIS — H02105 Unspecified ectropion of left lower eyelid: Secondary | ICD-10-CM | POA: Diagnosis not present

## 2022-01-07 DIAGNOSIS — H524 Presbyopia: Secondary | ICD-10-CM | POA: Diagnosis not present

## 2022-01-07 DIAGNOSIS — H40023 Open angle with borderline findings, high risk, bilateral: Secondary | ICD-10-CM | POA: Diagnosis not present

## 2022-01-07 DIAGNOSIS — H02102 Unspecified ectropion of right lower eyelid: Secondary | ICD-10-CM | POA: Diagnosis not present

## 2022-01-07 DIAGNOSIS — H52203 Unspecified astigmatism, bilateral: Secondary | ICD-10-CM | POA: Diagnosis not present

## 2022-01-13 ENCOUNTER — Telehealth: Payer: Self-pay

## 2022-01-13 NOTE — Telephone Encounter (Signed)
-----   Message from Timothy Lasso, RN sent at 12/25/2021  1:59 PM EST ----- Pt needs LFT 1 week prior to upcoming procedure on 12/28

## 2022-01-13 NOTE — Telephone Encounter (Signed)
I spoke with the pt and he is coming in on Wed for labs. Order has been entered

## 2022-01-13 NOTE — Telephone Encounter (Signed)
Left message on machine to call back  

## 2022-01-15 ENCOUNTER — Encounter (HOSPITAL_COMMUNITY): Payer: Self-pay | Admitting: Gastroenterology

## 2022-01-15 ENCOUNTER — Other Ambulatory Visit (INDEPENDENT_AMBULATORY_CARE_PROVIDER_SITE_OTHER): Payer: PPO

## 2022-01-15 DIAGNOSIS — K831 Obstruction of bile duct: Secondary | ICD-10-CM | POA: Diagnosis not present

## 2022-01-15 LAB — COMPREHENSIVE METABOLIC PANEL
ALT: 30 U/L (ref 0–53)
AST: 41 U/L — ABNORMAL HIGH (ref 0–37)
Albumin: 3.4 g/dL — ABNORMAL LOW (ref 3.5–5.2)
Alkaline Phosphatase: 109 U/L (ref 39–117)
BUN: 26 mg/dL — ABNORMAL HIGH (ref 6–23)
CO2: 24 mEq/L (ref 19–32)
Calcium: 9.4 mg/dL (ref 8.4–10.5)
Chloride: 107 mEq/L (ref 96–112)
Creatinine, Ser: 1.8 mg/dL — ABNORMAL HIGH (ref 0.40–1.50)
GFR: 33.16 mL/min — ABNORMAL LOW (ref >60.00)
Glucose, Bld: 117 mg/dL — ABNORMAL HIGH (ref 70–99)
Potassium: 4.3 mEq/L (ref 3.5–5.1)
Sodium: 140 mEq/L (ref 135–145)
Total Bilirubin: 0.9 mg/dL (ref 0.2–1.2)
Total Protein: 7 g/dL (ref 6.0–8.3)

## 2022-01-23 ENCOUNTER — Ambulatory Visit (HOSPITAL_COMMUNITY): Payer: PPO | Admitting: Anesthesiology

## 2022-01-23 ENCOUNTER — Ambulatory Visit (HOSPITAL_COMMUNITY)
Admission: RE | Admit: 2022-01-23 | Discharge: 2022-01-23 | Disposition: A | Discharge status: Home / Self Care | Payer: PPO | Source: Ambulatory Visit | Attending: Gastroenterology | Admitting: Gastroenterology

## 2022-01-23 ENCOUNTER — Other Ambulatory Visit: Payer: Self-pay

## 2022-01-23 ENCOUNTER — Ambulatory Visit (HOSPITAL_COMMUNITY): Payer: PPO

## 2022-01-23 ENCOUNTER — Encounter (HOSPITAL_COMMUNITY): Payer: Self-pay | Admitting: Gastroenterology

## 2022-01-23 ENCOUNTER — Ambulatory Visit (HOSPITAL_BASED_OUTPATIENT_CLINIC_OR_DEPARTMENT_OTHER): Payer: PPO | Admitting: Anesthesiology

## 2022-01-23 ENCOUNTER — Encounter (HOSPITAL_COMMUNITY)
Admission: RE | Disposition: A | Discharge status: Home / Self Care | Payer: Self-pay | Source: Ambulatory Visit | Attending: Gastroenterology

## 2022-01-23 DIAGNOSIS — G4730 Sleep apnea, unspecified: Secondary | ICD-10-CM | POA: Diagnosis not present

## 2022-01-23 DIAGNOSIS — C170 Malignant neoplasm of duodenum: Secondary | ICD-10-CM | POA: Diagnosis not present

## 2022-01-23 DIAGNOSIS — K760 Fatty (change of) liver, not elsewhere classified: Secondary | ICD-10-CM | POA: Diagnosis not present

## 2022-01-23 DIAGNOSIS — K8689 Other specified diseases of pancreas: Secondary | ICD-10-CM | POA: Diagnosis not present

## 2022-01-23 DIAGNOSIS — K2970 Gastritis, unspecified, without bleeding: Secondary | ICD-10-CM | POA: Diagnosis not present

## 2022-01-23 DIAGNOSIS — C241 Malignant neoplasm of ampulla of Vater: Secondary | ICD-10-CM | POA: Diagnosis not present

## 2022-01-23 DIAGNOSIS — I10 Essential (primary) hypertension: Secondary | ICD-10-CM | POA: Diagnosis not present

## 2022-01-23 DIAGNOSIS — D649 Anemia, unspecified: Secondary | ICD-10-CM | POA: Insufficient documentation

## 2022-01-23 DIAGNOSIS — E039 Hypothyroidism, unspecified: Secondary | ICD-10-CM | POA: Diagnosis not present

## 2022-01-23 DIAGNOSIS — K317 Polyp of stomach and duodenum: Secondary | ICD-10-CM

## 2022-01-23 DIAGNOSIS — G4733 Obstructive sleep apnea (adult) (pediatric): Secondary | ICD-10-CM | POA: Insufficient documentation

## 2022-01-23 DIAGNOSIS — K219 Gastro-esophageal reflux disease without esophagitis: Secondary | ICD-10-CM | POA: Diagnosis not present

## 2022-01-23 DIAGNOSIS — K2289 Other specified disease of esophagus: Secondary | ICD-10-CM

## 2022-01-23 DIAGNOSIS — K8050 Calculus of bile duct without cholangitis or cholecystitis without obstruction: Secondary | ICD-10-CM | POA: Diagnosis not present

## 2022-01-23 DIAGNOSIS — K831 Obstruction of bile duct: Secondary | ICD-10-CM

## 2022-01-23 DIAGNOSIS — K838 Other specified diseases of biliary tract: Secondary | ICD-10-CM

## 2022-01-23 DIAGNOSIS — I071 Rheumatic tricuspid insufficiency: Secondary | ICD-10-CM | POA: Diagnosis not present

## 2022-01-23 DIAGNOSIS — M1990 Unspecified osteoarthritis, unspecified site: Secondary | ICD-10-CM | POA: Diagnosis not present

## 2022-01-23 DIAGNOSIS — R7989 Other specified abnormal findings of blood chemistry: Secondary | ICD-10-CM

## 2022-01-23 DIAGNOSIS — I4891 Unspecified atrial fibrillation: Secondary | ICD-10-CM | POA: Diagnosis not present

## 2022-01-23 DIAGNOSIS — R935 Abnormal findings on diagnostic imaging of other abdominal regions, including retroperitoneum: Secondary | ICD-10-CM

## 2022-01-23 DIAGNOSIS — K862 Cyst of pancreas: Secondary | ICD-10-CM

## 2022-01-23 DIAGNOSIS — Z9989 Dependence on other enabling machines and devices: Secondary | ICD-10-CM | POA: Diagnosis not present

## 2022-01-23 DIAGNOSIS — Z87891 Personal history of nicotine dependence: Secondary | ICD-10-CM | POA: Diagnosis not present

## 2022-01-23 HISTORY — PX: BILIARY STENT PLACEMENT: SHX5538

## 2022-01-23 HISTORY — PX: EUS: SHX5427

## 2022-01-23 HISTORY — PX: BIOPSY: SHX5522

## 2022-01-23 HISTORY — PX: ENDOSCOPIC RETROGRADE CHOLANGIOPANCREATOGRAPHY (ERCP) WITH PROPOFOL: SHX5810

## 2022-01-23 HISTORY — PX: ESOPHAGOGASTRODUODENOSCOPY: SHX5428

## 2022-01-23 HISTORY — PX: REMOVAL OF STONES: SHX5545

## 2022-01-23 HISTORY — PX: SPHINCTEROTOMY: SHX5544

## 2022-01-23 SURGERY — UPPER ENDOSCOPIC ULTRASOUND (EUS) RADIAL
Anesthesia: General

## 2022-01-23 MED ORDER — CIPROFLOXACIN IN D5W 400 MG/200ML IV SOLN
400.0000 mg | Freq: Once | INTRAVENOUS | Status: AC
  Administered 2022-01-23: 400 mg via INTRAVENOUS

## 2022-01-23 MED ORDER — INDOMETHACIN 50 MG RE SUPP
RECTAL | Status: DC | PRN
  Administered 2022-01-23: 100 mg via RECTAL

## 2022-01-23 MED ORDER — PHENYLEPHRINE HCL-NACL 20-0.9 MG/250ML-% IV SOLN
INTRAVENOUS | Status: DC | PRN
  Administered 2022-01-23: 50 ug/min via INTRAVENOUS

## 2022-01-23 MED ORDER — ROCURONIUM BROMIDE 10 MG/ML (PF) SYRINGE
PREFILLED_SYRINGE | INTRAVENOUS | Status: DC | PRN
  Administered 2022-01-23: 50 mg via INTRAVENOUS
  Administered 2022-01-23: 20 mg via INTRAVENOUS

## 2022-01-23 MED ORDER — FENTANYL CITRATE (PF) 250 MCG/5ML IJ SOLN
INTRAMUSCULAR | Status: DC | PRN
  Administered 2022-01-23 (×3): 50 ug via INTRAVENOUS

## 2022-01-23 MED ORDER — OMEPRAZOLE 20 MG PO CPDR: 20.0000 mg | DELAYED_RELEASE_CAPSULE | Freq: Two times a day (BID) | ORAL | 6 refills | Status: DC

## 2022-01-23 MED ORDER — DICLOFENAC SUPPOSITORY 100 MG
RECTAL | Status: AC
  Filled 2022-01-23: qty 1

## 2022-01-23 MED ORDER — GLUCAGON HCL RDNA (DIAGNOSTIC) 1 MG IJ SOLR
INTRAMUSCULAR | Status: AC
  Filled 2022-01-23: qty 1

## 2022-01-23 MED ORDER — LACTATED RINGERS IV SOLN: INTRAVENOUS | Status: DC

## 2022-01-23 MED ORDER — INDOMETHACIN 50 MG RE SUPP
RECTAL | Status: AC
  Filled 2022-01-23: qty 2

## 2022-01-23 MED ORDER — SUGAMMADEX SODIUM 200 MG/2ML IV SOLN
INTRAVENOUS | Status: DC | PRN
  Administered 2022-01-23: 200 mg via INTRAVENOUS

## 2022-01-23 MED ORDER — PROPOFOL 10 MG/ML IV BOLUS
INTRAVENOUS | Status: AC
  Filled 2022-01-23: qty 20

## 2022-01-23 MED ORDER — LIDOCAINE 2% (20 MG/ML) 5 ML SYRINGE
INTRAMUSCULAR | Status: DC | PRN
  Administered 2022-01-23: 60 mg via INTRAVENOUS

## 2022-01-23 MED ORDER — CIPROFLOXACIN IN D5W 400 MG/200ML IV SOLN
INTRAVENOUS | Status: AC
  Filled 2022-01-23: qty 200

## 2022-01-23 MED ORDER — SODIUM CHLORIDE 0.9 % IV SOLN: INTRAVENOUS | Status: DC

## 2022-01-23 MED ORDER — PHENYLEPHRINE HCL (PRESSORS) 10 MG/ML IV SOLN
INTRAVENOUS | Status: AC
  Filled 2022-01-23: qty 1

## 2022-01-23 MED ORDER — APIXABAN 5 MG PO TABS: 5.0000 mg | ORAL_TABLET | Freq: Two times a day (BID) | ORAL | Status: DC

## 2022-01-23 MED ORDER — DEXAMETHASONE SODIUM PHOSPHATE 10 MG/ML IJ SOLN
INTRAMUSCULAR | Status: DC | PRN
  Administered 2022-01-23: 5 mg via INTRAVENOUS

## 2022-01-23 MED ORDER — GLUCAGON HCL RDNA (DIAGNOSTIC) 1 MG IJ SOLR
INTRAMUSCULAR | Status: DC | PRN
  Administered 2022-01-23 (×2): .25 mg via INTRAVENOUS

## 2022-01-23 MED ORDER — ONDANSETRON HCL 4 MG/2ML IJ SOLN
INTRAMUSCULAR | Status: DC | PRN
  Administered 2022-01-23: 4 mg via INTRAVENOUS

## 2022-01-23 MED ORDER — PROPOFOL 10 MG/ML IV BOLUS
INTRAVENOUS | Status: DC | PRN
  Administered 2022-01-23: 110 mg via INTRAVENOUS

## 2022-01-23 MED ORDER — FENTANYL CITRATE (PF) 250 MCG/5ML IJ SOLN
INTRAMUSCULAR | Status: AC
  Filled 2022-01-23: qty 5

## 2022-01-23 NOTE — Op Note (Signed)
[facility] Patient Name: Carl Woods Procedure Date: 01/23/2022 MRN: [id number] Attending MD: Justice Britain , MD, [id number] Date of Birth: 1931/06/19 CSN: [id number] Age: 88 Admit Type: Outpatient Procedure:                Upper EUS Indications:              Common bile duct dilation (acquired) seen on MRCP,                            Pancreatic cyst on MRCP, Dilated pancreatic duct on                            MRCP, Elevated liver enzymes Providers:                Justice Britain, MD, Jaci Carrel, RN,                            Luan Moore, Technician, [address]. Zenia Resides CRNA,                            CRNA Referring MD:             Gatha Mayer, MD, Ferd Glassing, MD Medicines:                General Anesthesia Complications:            No immediate complications. Estimated Blood Loss:     Estimated blood loss was minimal. Procedure:                Pre-Anesthesia Assessment:                           - Prior to the procedure, a History and Physical                            was performed, and patient medications and                            allergies were reviewed. The patient's tolerance of                            previous anesthesia was also reviewed. The risks                            and benefits of the procedure and the sedation                            options and risks were discussed with the patient.                            All questions were answered, and informed consent  was obtained. Prior Anticoagulants: The patient has                            taken Eliquis (apixaban), last dose was 2 days                            prior to procedure. ASA Grade Assessment: III - A                            patient with severe systemic disease. After                            reviewing the risks and benefits, the patient was                             deemed in satisfactory condition to undergo the                            procedure.                           After obtaining informed consent, the endoscope was                            passed under direct vision. Throughout the                            procedure, the patient's blood pressure, pulse, and                            oxygen saturations were monitored continuously. The                            GIF-H190 ([id number]) Olympus endoscope was introduced                            through the mouth, and advanced to the second part                            of duodenum. The TJF-Q190V ([id number]) Olympus                            duodenoscope was introduced through the mouth, and                            advanced to the area of papilla. The GF-UCT180                            ([id number]) Olympus linear ultrasound scope was                            introduced through the mouth, and advanced to the  duodenum for ultrasound examination from the                            stomach and duodenum. The upper EUS was                            accomplished without difficulty. The patient                            tolerated the procedure. Scope In: Scope Out: Findings:      ENDOSCOPIC FINDING: :      No gross lesions were noted in the entire esophagus.      The Z-line was irregular and was found 45 cm from the incisors.      Multiple small semi-sessile polyps were found in the gastric fundus -       consistent with fundic gland polyps.      Patchy mild inflammation characterized by erythema and granularity was       found in the entire examined stomach. Biopsies were taken with a cold       forceps for histology and Helicobacter pylori testing.      Localized nodular mucosa was found in the duodenal bulb.      No gross lesions were noted in the second portion of the duodenum.      A large intraduodenal portion of the major papilla was noted.      A single  15 mm submucosal ulcerated nodule was found at the papilla -       minor oozing noted when this was manipulated before sampling. Biopsies       were taken with a cold forceps for histology (after EUS was completed).      ENDOSONOGRAPHIC FINDING: :      There was dilation in the common bile duct (12.3 mm -> 20.4 mm) and in       the common hepatic duct (23.0 mm).      A small amount of hyperechoic material consistent with sludge was       visualized endosonographically in the common bile duct.      Moderate hyperechoic material consistent with sludge was visualized       endosonographically in the gallbladder with normal gallbladder wall       thickness.      Pancreatic parenchymal abnormalities were noted in the entire pancreas.       These consisted of hyperechoic foci with shadowing and cysts. There was       a 9.1 mm by 6.1 mm cyst in the neck of the pancreas. There was a 15.3 mm       by 14.1 mm cyst in the tail of the pancreas.      The pancreatic duct had a dilated endosonographic appearance, had a       prominently branched endosonographic appearance and had hyperechoic       walls in the pancreatic head (PD - 8.2 mm -> 4.3 mm), genu of the       pancreas (3.5 mm), body of the pancreas (3.2 mm) and tail of the       pancreas (2.8 mm).      A hypoechoic irregular lesion was identified endosonographically at the       ampulla. The lesion measured 15 mm by 13 mm in maximal cross-sectional  diameter. The lesion extended from the mucosa to the submucosa. The       outer margins were irregular. This is noted where CBD and PD dilation is       noted to occur.      Endosonographic imaging in the visualized portion of the liver showed no       mass.      No malignant-appearing lymph nodes were visualized in the celiac region       (level 20), peripancreatic region and porta hepatis region.      The celiac region was visualized. Impression:               EGD Impression:                            - No gross lesions in the entire esophagus. Z-line                            irregular, 45 cm from the incisors.                           - Multiple gastric polyps - fundic gland in                            appearance.                           - Gastritis. Biopsied.                           - Nodular mucosa in the duodenal bulb. No other                            gross lesions in the second portion of the duodenum.                           - Duodenal ampullary deformity (large intraduodenal                            portion).                           - Submucosal ulcerated nodule at the ampulla.                            Biopsied after EUS.                           EUS Impression:                           - There was dilation in the common bile duct and in                            the common hepatic duct.                           - Small amount of hyperechoic material  consistent                            with sludge was visualized endosonographically in                            the common bile duct.                           - Hyperechoic material consistent with sludge was                            visualized endosonographically in the gallbladder.                           - Pancreatic parenchymal abnormalities consisting                            of hyperechoic foci and cysts were noted in the                            entire pancreas.                           - The pancreatic duct had a dilated endosonographic                            appearance, had a prominently branched                            endosonographic appearance and had hyperechoic                            walls in the pancreatic head, genu of the pancreas,                            body of the pancreas and tail of the pancreas.                           - A lesion was found at the ampulla. Tissue has not                            been obtained. However, the endosonographic                             appearance is suspicious for malignancy (as noted                            above tissue samples were taken from the nodule -                            also see ERCP report for additional sampling of                            this area).                           -  No malignant-appearing lymph nodes were                            visualized in the celiac region (level 20),                            peripancreatic region and porta hepatis region. Moderate Sedation:      Not Applicable - Patient had care per Anesthesia. Recommendation:           - Proceed to scheduled ERCP.                           - Observe patient's clinical course.                           - Await path results.                           - Consideration of repeat EUS with FNA may be                            entertained pending pathology findings.                           - Increase PPI to 20 mg twice daily for 1 month.                           - The findings and recommendations were discussed                            with the patient.                           - The findings and recommendations were discussed                            with the patient's family. Procedure Code(s):        --- Professional ---                           [phone], Esophagogastroduodenoscopy, flexible,                            transoral; with endoscopic ultrasound examination                            limited to the esophagus, stomach or duodenum, and                            adjacent structures                           43239, Esophagogastroduodenoscopy, flexible,                            transoral; with biopsy, single or multiple Diagnosis Code(s):        ---  Professional ---                           K22.89, Other specified disease of esophagus                           K31.7, Polyp of stomach and duodenum                           K29.70, Gastritis, unspecified, without bleeding                            K31.89, Other diseases of stomach and duodenum                           K83.8, Other specified diseases of biliary tract                           K86.9, Disease of pancreas, unspecified                           K86.2, Cyst of pancreas                           I89.9, Noninfective disorder of lymphatic vessels                            and lymph nodes, unspecified                           K86.89, Other specified diseases of pancreas                           R74.8, Abnormal levels of other serum enzymes                           R93.3, Abnormal findings on diagnostic imaging of                            other parts of digestive tract CPT copyright 2022 [employer]. All rights reserved. The codes documented in this report are preliminary and upon coder review may  be revised to meet current compliance requirements. Justice Britain, MD 01/23/2022 3:32:54 PM Number of Addenda: 0

## 2022-01-23 NOTE — Transfer of Care (Signed)
Immediate Anesthesia Transfer of Care Note  Patient: ROMEY MATHIESON  Procedure(s) Performed: UPPER ENDOSCOPIC ULTRASOUND (EUS) RADIAL ENDOSCOPIC RETROGRADE CHOLANGIOPANCREATOGRAPHY (ERCP) WITH PROPOFOL BIOPSY ESOPHAGOGASTRODUODENOSCOPY (EGD) SPHINCTEROTOMY REMOVAL OF STONES BILIARY STENT PLACEMENT  Patient Location: Short Stay  Anesthesia Type:General  Level of Consciousness: awake, alert , and patient cooperative  Airway & Oxygen Therapy: Patient Spontanous Breathing and Patient connected to nasal cannula oxygen  Post-op Assessment: Report given to RN, Post -op Vital signs reviewed and stable, and Patient moving all extremities X 4  Post vital signs: Reviewed and stable  Last Vitals:  Vitals Value Taken Time  BP 162/61 01/23/22 1530  Temp 36.6 C 01/23/22 1526  Pulse 80 01/23/22 1531  Resp 16 01/23/22 1531  SpO2 98 % 01/23/22 1531  Vitals shown include unvalidated device data.  Last Pain:  Vitals:   01/23/22 1530  TempSrc:   PainSc: 0-No pain         Complications: No notable events documented.

## 2022-01-23 NOTE — Op Note (Addendum)
[facility] Patient Name: Carl Woods Procedure Date: 01/23/2022 MRN: [id number] Attending MD: Justice Britain , MD, [id number] Date of Birth: 26-Jun-1931 CSN: [id number] Age: 88 Admit Type: Outpatient Procedure:                ERCP Indications:              Abnormal MRCP, Periampullary papillary mass on                            Ultrasound, Evaluation and possible treatment of                            bile duct stone(s), Elevated liver enzymes Providers:                Justice Britain, MD, Jaci Carrel, RN,                            Luan Moore, Technician, [address]. Zenia Resides CRNA,                            CRNA Referring MD:             Gatha Mayer, MD, Merrilee Seashore, MD,                            Lavone Nian Lemmon Medicines:                General Anesthesia, Cipro 400 mg IV, Indomethacin                            100 mg PR, Glucagon 0.5 mg IV Complications:            No immediate complications. Estimated Blood Loss:     Estimated blood loss was minimal. Procedure:                Pre-Anesthesia Assessment:                           - Prior to the procedure, a History and Physical                            was performed, and patient medications and                            allergies were reviewed. The patient's tolerance of                            previous anesthesia was also reviewed. The risks                            and benefits of the procedure and the sedation                            options and risks were discussed with the patient.  All questions were answered, and informed consent                            was obtained. Prior Anticoagulants: The patient has                            taken Eliquis (apixaban), last dose was 2 days                            prior to procedure. ASA Grade Assessment: III - A                            patient with severe systemic disease. After                             reviewing the risks and benefits, the patient was                            deemed in satisfactory condition to undergo the                            procedure.                           After obtaining informed consent, the scope was                            passed under direct vision. Throughout the                            procedure, the patient's blood pressure, pulse, and                            oxygen saturations were monitored continuously. The                            TJF-Q190V ([id number]) Olympus duodenoscope was                            introduced through the mouth, and used to inject                            contrast into and used to inject contrast into the                            bile duct. The ERCP was accomplished without                            difficulty. The patient tolerated the procedure. Scope In: Scope Out: Findings:      The scout film was normal.      The esophagus was successfully intubated under direct vision without       detailed examination of the pharynx, larynx, and associated structures,       and upper GI tract.  The major papilla was ulcerated, prominent, with a       bulging intraduodenal portion.      A short 0.035 inch Soft Jagwire was passed into the biliary tree. The       Hydratome sphincterotome was passed over the guidewire and the bile duct       was then deeply cannulated. Contrast was injected. I personally       interpreted the bile duct images. Ductal flow of contrast was adequate.       Image quality was adequate. Contrast extended to the hepatic ducts.       Opacification of the entire biliary tree except for the cystic duct and       gallbladder was successful. The main bile duct was severely dilated. The       largest diameter was at least 20 mm. A 9 mm biliary sphincterotomy was       made with a monofilament Hydratome sphincterotome using ERBE       electrocautery. There was no post-sphincterotomy bleeding.  To discover       objects, the biliary tree was swept with a retrieval balloon. Sludge was       swept from the duct. An occlusion cholangiogram was performed that       showed no further significant biliary pathology. The ampullary orifice       was biopsied with a cold forceps for histology after having been       openedto try and get further deeper tissue for sampling purposes (was       placed in the bottle ampullary tissue). One 10 Fr by 5 cm plastic       biliary stent with a single external flap and a single internal flap was       placed into the common bile duct. The stent was in good position.      A pancreatogram was not performed.      The duodenoscope was withdrawn from the patient. Impression:               - The major papilla appeared to be ulcerated,                            prominent, with a larger intraduodenal portion.                           - The entire main bile duct was severely dilated.                           - A biliary sphincterotomy was performed.                           - The biliary tree was swept and sludge was found.                           - Biopsy was performed of the ampulla after                            sphincterotomy to get deeper tissue                           - One plastic biliary stent  was placed into the                            common bile duct. Moderate Sedation:      Not Applicable - Patient had care per Anesthesia. Recommendation:           - The patient will be observed post-procedure,                            until all discharge criteria are met.                           - Discharge patient to home.                           - Patient has a contact number available for                            emergencies. The signs and symptoms of potential                            delayed complications were discussed with the                            patient. Return to normal activities tomorrow.                            Written  discharge instructions were provided to the                            patient.                           - Low fat diet.                           - Eliquis restart on 12/30 PM (48 hour from                            procedure).                           - Observe patient's clinical course.                           - Check liver enzymes (AST, ALT, alkaline                            phosphatase, bilirubin) in 1-2 weeks.                           - Await path results.                           - Watch for pancreatitis, bleeding, perforation,  and cholangitis.                           - Repeat ERCP in 4 months to exchange stent.                            Earlier follow up can be considered based on                            overall pathology and findings. If malignancy is                            found, then more permanent stenting may need to be                            considered.                           - The findings and recommendations were discussed                            with the patient.                           - The findings and recommendations were discussed                            with the patient's family. Procedure Code(s):        --- Professional ---                           [phone], Endoscopic retrograde                            cholangiopancreatography (ERCP); with placement of                            endoscopic stent into biliary or pancreatic duct,                            including pre- and post-dilation and guide wire                            passage, when performed, including sphincterotomy,                            when performed, each stent                           43264, Endoscopic retrograde                            cholangiopancreatography (ERCP); with removal of                            calculi/debris from biliary/pancreatic duct(s)  00511, 59, Endoscopic retrograde                             cholangiopancreatography (ERCP); with biopsy,                            single or multiple Diagnosis Code(s):        --- Professional ---                           K83.8, Other specified diseases of biliary tract                           K80.50, Calculus of bile duct without cholangitis                            or cholecystitis without obstruction                           R74.8, Abnormal levels of other serum enzymes                           R93.2, Abnormal findings on diagnostic imaging of                            liver and biliary tract CPT copyright 2022 American [employer] Association. All rights reserved. The codes documented in this report are preliminary and upon coder review may  be revised to meet current compliance requirements. Justice Britain, MD 01/23/2022 3:42:58 PM Number of Addenda: 0

## 2022-01-23 NOTE — Anesthesia Preprocedure Evaluation (Addendum)
Anesthesia Evaluation  Patient identified by MRN, date of birth, ID band Patient awake    Reviewed: Allergy & Precautions, NPO status , Patient's Chart, lab work & pertinent test results  Airway Mallampati: II  TM Distance: >3 FB Neck ROM: Full    Dental  (+) Partial Lower, Upper Dentures   Pulmonary sleep apnea and Continuous Positive Airway Pressure Ventilation , former smoker   Pulmonary exam normal        Cardiovascular hypertension, Pt. on medications and Pt. on home beta blockers + dysrhythmias Atrial Fibrillation  Rhythm:Irregular Rate:Normal  Echocardiogram 06/18/2021:  Normal LV systolic function with visual EF 55-60%. Left ventricle cavity  is normal in size. Normal left ventricular wall thickness. Normal global  wall motion. Unable to evaluate diastolic function due to atrial  fibrillation.  Trace aortic regurgitation.  Mild tricuspid regurgitation. No evidence of pulmonary hypertension. RVSP  measures 33 mmHg.  Compared to 02/06/2020 small pericardial effusion has resolved otherwise  no significant change.     Neuro/Psych negative neurological ROS  negative psych ROS   GI/Hepatic Neg liver ROS,GERD  Medicated,,  Endo/Other  Hypothyroidism    Renal/GU   negative genitourinary   Musculoskeletal  (+) Arthritis , Osteoarthritis,    Abdominal Normal abdominal exam  (+)   Peds  Hematology  (+) Blood dyscrasia, anemia   Anesthesia Other Findings   Reproductive/Obstetrics                             Anesthesia Physical Anesthesia Plan  ASA: 3  Anesthesia Plan: General   Post-op Pain Management:    Induction: Intravenous  PONV Risk Score and Plan: 2 and Ondansetron, Dexamethasone and Treatment may vary due to age or medical condition  Airway Management Planned: Mask and Oral ETT  Additional Equipment: None  Intra-op Plan:   Post-operative Plan: Extubation in  OR  Informed Consent: I have reviewed the patients History and Physical, chart, labs and discussed the procedure including the risks, benefits and alternatives for the proposed anesthesia with the patient or authorized representative who has indicated his/her understanding and acceptance.     Dental advisory given  Plan Discussed with: CRNA  Anesthesia Plan Comments:        Anesthesia Quick Evaluation

## 2022-01-23 NOTE — H&P (Signed)
GASTROENTEROLOGY PROCEDURE H&P NOTE   Primary Care Physician: Merrilee Seashore, MD  HPI: Carl Woods is a 88 y.o. male who presents for EUS +/- ERCP for evaluation of CBD dilation, [name], [name]  Past Medical History:  Diagnosis Date   Anal fistula    Arthritis    Fingers and hands   Atrial fibrillation ([place])    AVM (arteriovenous malformation)    Clipped during Colonoscopy 05/2016   Barrett's esophagus    Bilateral cataracts    BPH (benign prostatic hyperplasia)    Cecal angiodysplasia 05/28/2016   ablated at colonoscopy   Deviated nasal septum    Diverticulosis of sigmoid colon    E. 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   Left Forehead (treatment after biopsy)   OSA on CPAP    Perianal rash    Recurrent epistaxis    Renal cyst 01/04/2013   Small left peripelvic renal cysts , noted on US Renal   SCCA (squamous cell carcinoma) of skin 10/17/2015   Left Sup Bridge of Nose (curet, cautery and 5FU)   Seasonal allergies    Superficial basal cell carcinoma (BCC) 10/17/2015   Left Bulb of Nose (curet, cautery and 5FU)   Tubular adenoma    Past Surgical History:  Procedure Laterality Date   COLONOSCOPY  multiple   CYSTOSCOPY     ESOPHAGOGASTRODUODENOSCOPY  multiple   EVALUATION UNDER ANESTHESIA WITH FISTULECTOMY N/A 03/02/2018   Procedure: ANORECTAL EXAM UNDER ANESTHESIA WITH REPAIR OF SUPERFICIAL PERIRECTAL FISTULA AND HEMORRHOIDECTOMY;  Surgeon: Michael Boston, MD;  Location: [facility];  Service: General;  Laterality: N/A;   EXPLORATORY LAPAROTOMY     No current facility-administered medications for this encounter.   No current facility-administered medications for this encounter. No Known Allergies Family History  Problem Relation Age of Onset   Heart  disease Mother    Other Father        old age   Ovarian cancer Sister    Colon cancer Neg Hx    Stomach cancer Neg Hx    Rectal cancer Neg Hx    Esophageal cancer Neg Hx    Liver cancer Neg Hx    Social History   Socioeconomic History   Marital status: Married    Spouse name: Not on file   Number of children: 3   Years of education: Not on file   Highest education level: Not on file  Occupational History   Occupation: retired  Tobacco Use   Smoking status: Former    Packs/day: 0.50    Years: 40.00    Total pack years: 20.00    Types: Cigarettes    Quit date: 2006    Years since quitting: 18.0   Smokeless tobacco: Never  Vaping Use   Vaping Use: Never used  Substance and Sexual Activity   Alcohol use: Yes    Comment: occasional wine   Drug use: No   Sexual activity: Not on file  Other Topics Concern   Not on file  Social History Narrative   Not on file   Social Determinants of Health   Financial Resource Strain: Not on file  Food Insecurity: Not on file  Transportation Needs: Not on file  Physical Activity: Not on file  Stress:  Not on file  Social Connections: Not on file  Intimate Partner Violence: Not on file    Physical Exam: There were no vitals filed for this visit. There is no height or weight on file to calculate BMI. GEN: NAD EYE: Sclerae anicteric ENT: MMM CV: Non-tachycardic GI: Soft, NT/ND NEURO:  Alert & Oriented x 3  Lab Results: No results for input(s): "WBC", "HGB", "HCT", "PLT" in the last 72 hours. BMET No results for input(s): "NA", "K", "CL", "CO2", "GLUCOSE", "BUN", "CREATININE", "CALCIUM" in the last 72 hours. LFT No results for input(s): "PROT", "ALBUMIN", "AST", "ALT", "ALKPHOS", "BILITOT", "BILIDIR", "IBILI" in the last 72 hours. PT/INR No results for input(s): "LABPROT", "INR" in the last 72 hours.   Impression / Plan: This is a 88 y.o.male who presents for EUS +/- ERCP for evaluation of CBD dilation, [name], in  setting of persistent gallbladder and PD dilation.  The risks of an EUS including intestinal perforation, bleeding, infection, aspiration, and medication effects were discussed as was the possibility it may not give a definitive diagnosis if a biopsy is performed.  When a biopsy of the pancreas is done as part of the EUS, there is an additional risk of pancreatitis at the rate of about 1-2%.  It was explained that procedure related pancreatitis is typically mild, although it can be severe and even life threatening, which is why we do not perform random pancreatic biopsies and only biopsy a lesion/area we feel is concerning enough to warrant the risk.  The risks of an ERCP were discussed at length, including but not limited to the risk of perforation, bleeding, abdominal pain, post-ERCP pancreatitis (while usually mild can be severe and even life threatening).   The risks and benefits of endoscopic evaluation/treatment were discussed with the patient and/or family; these include but are not limited to the risk of perforation, infection, bleeding, missed lesions, lack of diagnosis, severe illness requiring hospitalization, as well as anesthesia and sedation related illnesses.  The patient's history has been reviewed, patient examined, no change in status, and deemed stable for procedure.  The patient and/or family is agreeable to proceed.    Justice Britain, MD [facility] Office # [phone]

## 2022-01-23 NOTE — Discharge Instructions (Signed)
YOU HAD AN ENDOSCOPIC PROCEDURE TODAY: Refer to the procedure report and other information in the discharge instructions given to you for any specific questions about what was found during the examination. If this information does not answer your questions, please call [place] office at [phone] to clarify.   YOU SHOULD EXPECT: Some feelings of bloating in the abdomen. Passage of more gas than usual. Walking can help get rid of the air that was put into your GI tract during the procedure and reduce the bloating. If you had a lower endoscopy (such as a colonoscopy or flexible sigmoidoscopy) you may notice spotting of blood in your stool or on the toilet paper. Some abdominal soreness may be present for a day or two, also.  DIET: Your first meal following the procedure should be a light meal and then it is ok to progress to your normal diet. A half-sandwich or bowl of soup is an example of a good first meal. Heavy or fried foods are harder to digest and may make you feel nauseous or bloated. Drink plenty of fluids but you should avoid alcoholic beverages for 24 hours. If you had a esophageal dilation, please see attached instructions for diet.    ACTIVITY: Your care partner should take you home directly after the procedure. You should plan to take it easy, moving slowly for the rest of the day. You can resume normal activity the day after the procedure however YOU SHOULD NOT DRIVE, use power tools, machinery or perform tasks that involve climbing or major physical exertion for 24 hours (because of the sedation medicines used during the test).   SYMPTOMS TO REPORT IMMEDIATELY: A gastroenterologist can be reached at any hour. Please call [phone]  for any of the following symptoms:   Following upper endoscopy (EGD, EUS, ERCP, esophageal dilation) Vomiting of blood or coffee ground material  New, significant abdominal pain  New, significant chest pain or pain under the shoulder blades  Painful or  persistently difficult swallowing  New shortness of breath  Black, tarry-looking or red, bloody stools  FOLLOW UP:  If any biopsies were taken you will be contacted by phone or by letter within the next 1-3 weeks. Call [phone]  if you have not heard about the biopsies in 3 weeks.  Please also call with any specific questions about appointments or follow up tests.  

## 2022-01-23 NOTE — Anesthesia Procedure Notes (Signed)
Procedure Name: Intubation Date/Time: 01/23/2022 1:54 PM  Performed by: Lollie Sails, CRNAPre-anesthesia Checklist: Patient identified, Emergency Drugs available, Suction available, Patient being monitored and Timeout performed Patient Re-evaluated:Patient Re-evaluated prior to induction Oxygen Delivery Method: Circle system utilized Preoxygenation: Pre-oxygenation with 100% oxygen Induction Type: IV induction Ventilation: Two handed mask ventilation required Laryngoscope Size: Miller and 3 Grade View: Grade I Tube type: Oral Tube size: 7.5 mm Number of attempts: 1 Airway Equipment and Method: Stylet Placement Confirmation: ETT inserted through vocal cords under direct vision, positive ETCO2 and breath sounds checked- equal and bilateral Secured at: 23 cm Tube secured with: Tape Dental Injury: Teeth and Oropharynx as per pre-operative assessment

## 2022-01-24 MED ORDER — SODIUM CHLORIDE 0.9 % IV SOLN
INTRAVENOUS | Status: DC | PRN
  Administered 2022-01-23: 15 mL

## 2022-01-24 NOTE — Anesthesia Postprocedure Evaluation (Signed)
Anesthesia Post Note  Patient: Carl Woods  Procedure(s) Performed: UPPER ENDOSCOPIC ULTRASOUND (EUS) RADIAL ENDOSCOPIC RETROGRADE CHOLANGIOPANCREATOGRAPHY (ERCP) WITH PROPOFOL BIOPSY ESOPHAGOGASTRODUODENOSCOPY (EGD) SPHINCTEROTOMY REMOVAL OF STONES BILIARY STENT PLACEMENT     Patient location during evaluation: PACU Anesthesia Type: General Level of consciousness: awake and alert Pain management: pain level controlled Vital Signs Assessment: post-procedure vital signs reviewed and stable Respiratory status: spontaneous breathing, nonlabored ventilation, respiratory function stable and patient connected to nasal cannula oxygen Cardiovascular status: blood pressure returned to baseline and stable Postop Assessment: no apparent nausea or vomiting Anesthetic complications: no   No notable events documented.  Last Vitals:  Vitals:   01/23/22 1600 01/23/22 1610  BP: (!) 156/58 (!) 146/69  Pulse: 75 63  Resp: 20 19  Temp:    SpO2: 97% 96%    Last Pain:  Vitals:   01/23/22 1610  TempSrc:   PainSc: 0-No pain                 Belenda Cruise P Meshell Abdulaziz

## 2022-01-25 ENCOUNTER — Encounter: Payer: Self-pay | Admitting: Gastroenterology

## 2022-01-25 ENCOUNTER — Encounter (HOSPITAL_COMMUNITY): Payer: Self-pay | Admitting: Gastroenterology

## 2022-01-28 ENCOUNTER — Other Ambulatory Visit: Payer: Self-pay

## 2022-01-28 DIAGNOSIS — C801 Malignant (primary) neoplasm, unspecified: Secondary | ICD-10-CM

## 2022-01-30 ENCOUNTER — Other Ambulatory Visit (INDEPENDENT_AMBULATORY_CARE_PROVIDER_SITE_OTHER): Payer: PPO

## 2022-01-30 DIAGNOSIS — C801 Malignant (primary) neoplasm, unspecified: Secondary | ICD-10-CM

## 2022-01-30 LAB — HEPATIC FUNCTION PANEL
ALT: 15 U/L (ref 0–53)
AST: 19 U/L (ref 0–37)
Albumin: 3.4 g/dL — ABNORMAL LOW (ref 3.5–5.2)
Alkaline Phosphatase: 80 U/L (ref 39–117)
Bilirubin, Direct: 0.2 mg/dL (ref 0.0–0.3)
Total Bilirubin: 0.6 mg/dL (ref 0.2–1.2)
Total Protein: 6.7 g/dL (ref 6.0–8.3)

## 2022-01-30 NOTE — Progress Notes (Signed)
[place] Telephone:(336) [phone]   Fax:(336) [phone]  CONSULT NOTE  REFERRING PHYSICIAN: Dr. Rush Landmark   REASON FOR CONSULTATION:  Ampullary Adenocarcinoma   HPI Carl Woods is a 89 y.o. male with past medical history significant for atrial fibrillation (on Eliquis), Barrett's esophagitis, renal insuffiencey, hypertension, diverticulosis, sleep apnea, GERD, and iron deficiency anemia is referred to the clinic for newly diagnosed ampullary adenocarcinoma.   The patient was seen by his PCP, Dr. Ashby Dawes, for an annual physical exam in October 2023. The patient reportedly had labs showing elevated LFTs (I do not have these lab values). Due to the elevated alkaline phosphatase, bilirubin, and weight loss, he subsequently had a CT of the AP to evaluate this.   CT AP was performed on 12/06/21 showing moderate intrahepatic bile duct dilation and marked fusiform dilation of the common bile duct. His scan also incidentally mentioned lumbar compression fracture. His CT also mentioned abnormal contour of the liver suggestive of underlying cirrhosis, although no cirrhotic features were noted on follow up abdominal MRI.    MRI and MRCP which was performed on 12/16/2021 demonstrating moderate intrahepatic and extrahepatic bile duct dilation  The patient underwent EUS and ERCP on 01/23/2022 under the care of Dr. Rush Landmark. A biliary sphincterotomy was performed and plastic biliary stent was placed in the common bile duct. An irregular lesion ws identified at the ampulla measuring 15 mm x 13 mm. The lesion extended from the mucosa to the submucosa. No malignant appearing lymph nodes were visualized in the celiac region, peripancreatic and porta hepatis.   The final pathology ([id number]) of the ampullary lesion showed adenocarcinoma.   He had a CT scan of the chest ordered to complete the staging workup but it has not been scheduled at this time.  Overall, the patient is  feeling well.  He denies any fever, chills, or night sweats.  It is unclear how much weight the patient lost.  He reports that 6 months ago his baseline weight was around 250 pounds, although a see a weight from 06/03/21 of 230 lbs in the chart.  He states he weighed 211 pounds at his PCPs office in November 2023.  He weighs 233 pounds on our scale today (01/31/22).  The patient denies any jaundice, itching, abdominal pain, nausea, vomiting, or diarrhea.  He did report he had some light-colored stools for few months but has returned to a normal color after having his EUS/ERCP.  Denies any blood in the stool.  Denies any dark urine.  Denies any chest pain, hemoptysis, or cough.  Patient does report he may get winded walking to the mailbox and back; however this seems to be associated more with eating to rest due to low back pain which is likely from his compression fracture which he was not aware of.  He reports he sometimes has chronic chest congestion, but this is unchanged.   Regarding the family's medical history, he states the only malignancy in his family that he is aware of is his sister who had ovarian cancer around the age of 10.  The patient's mother had respiratory "issues", arthritis, and "gallbladder issues".  The patient's father had heart failure.  His siblings had Alzheimer's and strokes and 2 sisters who require cholecystectomies for non-malignancy related reasons.   Patient used to work as an Optometrist.  He is married and has 3 children who live in [place], [place], and [place].  The patient lives in a two-story house; however, they do not  use the upstairs.  The patient ambulates without any assistive devices but may use a cane if he is out in public.  The patient still drives and is independent with his care.  He takes care of his wife.  He is a former smoker having smoked approximately 30 years averaging less than 1 pack cigarettes per day.  He quit in 2006.  Denies any alcohol or drug  use.   HPI  Past Medical History:  Diagnosis Date   Anal fistula    Arthritis    Fingers and hands   Atrial fibrillation ([place])    AVM (arteriovenous malformation)    Clipped during Colonoscopy 05/2016   Barrett's esophagus    Bilateral cataracts    BPH (benign prostatic hyperplasia)    Cecal angiodysplasia 05/28/2016   ablated at colonoscopy   Deviated nasal septum    Diverticulosis of sigmoid colon    E. 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   Left Forehead (treatment after biopsy)   OSA on CPAP    Perianal rash    Recurrent epistaxis    Renal cyst 01/04/2013   Small left peripelvic renal cysts , noted on US Renal   SCCA (squamous cell carcinoma) of skin 10/17/2015   Left Sup Bridge of Nose (curet, cautery and 5FU)   Seasonal allergies    Superficial basal cell carcinoma (BCC) 10/17/2015   Left Bulb of Nose (curet, cautery and 5FU)   Tubular adenoma     Past Surgical History:  Procedure Laterality Date   BILIARY STENT PLACEMENT N/A 01/23/2022   Procedure: BILIARY STENT PLACEMENT;  Surgeon: Irving Copas., MD;  Location: [facility];  Service: Gastroenterology;  Laterality: N/A;   BIOPSY  01/23/2022   Procedure: BIOPSY;  Surgeon: Rush Landmark Telford Nab., MD;  Location: [facility];  Service: Gastroenterology;;   COLONOSCOPY  multiple   CYSTOSCOPY     ENDOSCOPIC RETROGRADE CHOLANGIOPANCREATOGRAPHY (ERCP) WITH PROPOFOL N/A 01/23/2022   Procedure: ENDOSCOPIC RETROGRADE CHOLANGIOPANCREATOGRAPHY (ERCP) WITH PROPOFOL;  Surgeon: Irving Copas., MD;  Location: [facility];  Service: Gastroenterology;  Laterality: N/A;   ESOPHAGOGASTRODUODENOSCOPY  multiple   ESOPHAGOGASTRODUODENOSCOPY N/A 01/23/2022   Procedure: ESOPHAGOGASTRODUODENOSCOPY (EGD);  Surgeon: Irving Copas., MD;  Location: [facility];  Service: Gastroenterology;  Laterality: N/A;   EUS N/A 01/23/2022   Procedure: UPPER ENDOSCOPIC ULTRASOUND (EUS) RADIAL;  Surgeon: Irving Copas., MD;  Location: [facility];  Service: Gastroenterology;  Laterality: N/A;   EVALUATION UNDER ANESTHESIA WITH FISTULECTOMY N/A 03/02/2018   Procedure: ANORECTAL EXAM UNDER ANESTHESIA WITH REPAIR OF SUPERFICIAL PERIRECTAL FISTULA AND HEMORRHOIDECTOMY;  Surgeon: Michael Boston, MD;  Location: [facility];  Service: General;  Laterality: N/A;   EXPLORATORY LAPAROTOMY     REMOVAL OF STONES  01/23/2022   Procedure: REMOVAL OF STONES;  Surgeon: Irving Copas., MD;  Location: [facility];  Service: Gastroenterology;;   Joan Mayans  01/23/2022   Procedure: Joan Mayans;  Surgeon: Mansouraty, Telford Nab., MD;  Location: [facility];  Service: Gastroenterology;;    Family History  Problem Relation Age of Onset   Heart disease Mother    Other Father        old age   Ovarian cancer Sister    Colon cancer Neg Hx    Stomach cancer Neg Hx    Rectal cancer Neg  Hx    Esophageal cancer Neg Hx    Liver cancer Neg Hx     Social History Social History   Tobacco Use   Smoking status: Former    Packs/day: 0.50    Years: 40.00    Total pack years: 20.00    Types: Cigarettes    Quit date: 2006    Years since quitting: 18.0   Smokeless tobacco: Never  Vaping Use   Vaping Use: Never used  Substance Use Topics   Alcohol use: Yes    Comment: occasional wine   Drug use: No    No Known Allergies  Current Outpatient Medications  Medication Sig Dispense Refill   apixaban (ELIQUIS) 5 MG TABS tablet Take 1 tablet (5 mg total) by mouth 2 (two) times daily. 60 tablet    Cholecalciferol (VITAMIN D3) 5000 units CAPS Take 1 capsule by mouth every evening.     dapagliflozin propanediol (FARXIGA) 10 MG TABS tablet Take 10 mg by mouth in the morning.     Fe Fum-Fe Poly-Vit C-Lactobac (FUSION) 65-65-25-30 MG CAPS Take 1 capsule by  mouth 3 (three) times a week.     fexofenadine (ALLEGRA) 180 MG tablet Take 180 mg by mouth as needed for allergies or rhinitis.     furosemide (LASIX) 20 MG tablet Take 20 mg by mouth in the morning.     Glucosamine-Chondroit-Vit C-Mn (GLUCOSAMINE CHONDR 500 COMPLEX PO) Take 1 tablet by mouth daily.      ketoconazole (NIZORAL) 2 % cream Apply 1 Application topically 2 (two) times daily as needed (skin rash/irritation.).     levothyroxine (SYNTHROID, LEVOTHROID) 112 MCG tablet Take 112 mcg by mouth daily before breakfast.      losartan (COZAAR) 25 MG tablet Take 25 mg by mouth in the morning.     metoprolol succinate (TOPROL-XL) 25 MG 24 hr tablet TAKE 1 TABLET (25 MG TOTAL) BY MOUTH DAILY. TAKE WITH OR IMMEDIATELY FOLLOWING A MEAL. 90 tablet 3   nitroGLYCERIN (NITROSTAT) 0.4 MG SL tablet Place 1 tablet (0.4 mg total) under the tongue every 5 (five) minutes as needed for up to 25 days for chest pain. 25 tablet 3   omeprazole (PRILOSEC) 20 MG capsule Take 1 capsule (20 mg total) by mouth 2 (two) times daily before a meal. Twice daily for 1 month.  Then return back to daily dosing. 60 capsule 6   rosuvastatin (CRESTOR) 5 MG tablet TAKE 1 TABLET (5 MG TOTAL) BY MOUTH DAILY. (Patient not taking: Reported on 01/17/2022) 90 tablet 0   sodium chloride (OCEAN) 0.65 % SOLN nasal spray Place 1 spray into both nostrils as needed for congestion.     No current facility-administered medications for this visit.    REVIEW OF SYSTEMS:   Review of Systems  Constitutional: Positive for weight loss.  Negative for appetite change, chills, fatigue, and fever.  HENT: Negative for mouth sores, nosebleeds, sore throat and trouble swallowing.   Eyes: Negative for eye problems and icterus.  Respiratory: Positive for chronic "chest congestion".  Negative for hemoptysis, shortness of breath and wheezing.   Cardiovascular: Negative for chest pain and leg swelling.  Gastrointestinal: Negative for abdominal pain,  constipation, diarrhea, nausea and vomiting.  Genitourinary: Negative for bladder incontinence, difficulty urinating, dysuria, frequency and hematuria.   Musculoskeletal: Negative for back pain, gait problem, neck pain and neck stiffness.  Skin: Negative for itching and rash.  Neurological: Negative for dizziness, extremity weakness, gait problem, headaches, light-headedness and seizures.  Hematological: Negative  for adenopathy. Does not bruise/bleed easily.  Psychiatric/Behavioral: Negative for confusion, depression and sleep disturbance. The patient is not nervous/anxious.     PHYSICAL EXAMINATION:  Blood pressure (!) 121/55, pulse (!) 46, temperature 97.7 F (36.5 C), temperature source Oral, resp. rate 18, weight 233 lb (105.7 kg), SpO2 99 %.  ECOG PERFORMANCE STATUS: 1  Physical Exam  Constitutional: Oriented to person, place, and time and well-developed, well-nourished, and in no distress.  HENT:  Head: Normocephalic and atraumatic.  Mouth/Throat: Oropharynx is clear and moist. No oropharyngeal exudate.  Eyes: Conjunctivae are normal. Right eye exhibits no discharge. Left eye exhibits no discharge. No scleral icterus.  Neck: Normal range of motion. Neck supple.  Cardiovascular: Bradycardic, irregular rhythm, normal heart sounds and intact distal pulses.   Pulmonary/Chest: Effort normal and breath sounds normal. No respiratory distress. No wheezes. No rales.  Abdominal: Soft. Bowel sounds are normal. Exhibits no distension and no mass. There is no tenderness.  Musculoskeletal: Normal range of motion. Exhibits no edema.  Lymphadenopathy:    No cervical adenopathy.  Neurological: Alert and oriented to person, place, and time. Exhibits normal muscle tone. Gait normal. Coordination normal.  Skin: Skin is warm and dry. No rash noted. Not diaphoretic. No erythema. No pallor.  Psychiatric: Mood, memory and judgment normal.  Vitals reviewed.  LABORATORY DATA: Lab Results  Component  Value Date   WBC 5.8 12/25/2021   HGB 10.6 (L) 12/25/2021   HCT 31.6 (L) 12/25/2021   MCV 109.0 (H) 12/25/2021   PLT 158.0 12/25/2021      Chemistry      Component Value Date/Time   NA 140 01/15/2022 1143   K 4.3 01/15/2022 1143   CL 107 01/15/2022 1143   CO2 24 01/15/2022 1143   BUN 26 (H) 01/15/2022 1143   CREATININE 1.80 (H) 01/15/2022 1143      Component Value Date/Time   CALCIUM 9.4 01/15/2022 1143   ALKPHOS 80 01/30/2022 1328   AST 19 01/30/2022 1328   ALT 15 01/30/2022 1328   BILITOT 0.6 01/30/2022 1328       RADIOGRAPHIC STUDIES: DG ERCP  Result Date: 01/24/2022 CLINICAL DATA:  Biliary dilatation. EXAM: ERCP TECHNIQUE: Multiple spot images obtained with the fluoroscopic device and submitted for interpretation post-procedure. FLUOROSCOPY: Radiation Exposure Index (as provided by the fluoroscopic device): 68.79 mGy Kerma COMPARISON:  MRCP 12/16/2021 FINDINGS: Retrograde cholangiogram demonstrates a dilated extrahepatic bile duct. Transition point in the distal common bile duct with severe narrowing near the ampulla. No definite or significant drainage into the duodenum identified. Wire was advanced into the biliary system. No large filling defects or stones identified on these images. Nonmetallic biliary stent was placed. IMPRESSION: Dilatation of the extrahepatic bile duct with severe tapering or narrowing in the distal common bile duct. Placement of biliary stent. These images were submitted for radiologic interpretation only. Please see the procedural report for the amount of contrast. Electronically Signed   By: Markus Daft M.D.   On: 01/24/2022 08:40    ASSESSMENT: This is a very pleasant 89 year old male with   Ampullary Adenocarcinoma, diagnosed in December 2023, likely early stage, (T1, N0, M0) -He with weight loss, elevated LFTs, and elevated bilirubin in October 2023 -Imaging revealed common bile duct and intrahepatic ductal dilation  -Underwent ERCP under the  care of Dr. Rush Landmark an irregular lesion ws identified at the ampulla measuring 15 mm x 13 mm. No malignant appearing lymph nodes were visualized in the celiac region, peripancreatic and porta  hepatis. A biliary sphincterotomy was performed and plastic biliary stent placed in the common bile duct.  -The final pathology of the ampullary biopsy showed adenocarcinoma.  Lesion 1 and MMR requested today by nurse navigator.   -The patient was seen with Dr. Burr Medico today. Dr. Burr Medico had a lengthy discussion with the patient about his current condition and management options.  -Dr. Burr Medico had a discussion with the patient about his current condition and general principles of treatment. She discussed the best option for possible cure is surgery.  However, Dr. Burr Medico discussed the surgery is extensive with some considerations of long recovery and aftercare.  However, the patient is in reasonably good shape for his age and the patient is open to at least having a discussion with the general surgery.  I have placed a referral to [facility] surgery.  Will also discuss the patient's case at the multidisciplinary conference next week. -If the patient is not a candidate for surgery, we discussed other options such as immunotherapy, targeted therapy, radiation, and chemo.  -We are arranging for molecular studies to see if the patient is a candidate for immunotherapy.  If he is not a candidate for surgery then Dr. Burr Medico would strongly consider immunotherapy if he is found to be candidate -If he is not a candidate for immunotherapy, then Dr. Burr Medico will discuss options including radiation with or without chemo.  Dr. Burr Medico discussed the patient is likely not a candidate for multidrug chemotherapy regimens due to tolerability and side effects.  However, she may consider him for single agent chemotherapy. -We will see the patient back for follow-up visit in 2 to 3 weeks for a more detailed discussion about his current condition and  recommended treatment options -In the meantime, he was given the number to schedule his staging CT scan to complete the staging workup.  2. Elevated LFTs/biliary obstruction -Presented with elevated LFTs and biliary ductal dilation. -Last BMP was performed yesterday which showed normal bilirubin at 0.5 normal AST and ALT -He does have a biliary stent which was placed on 01/23/22 which will need to be exchanged in a few months  3. Weight loss -Unintentionally lost 40 lbs in 6 months reportedly, appears to be gaining weight back -He reports good appetite. He is not interested in seeing a nutritionist at this time but is aware this is a service that is offered here at [facility]  4.  Comorbid conditions (CKD, atrial fibrillation on Eliquis, hypertension, etc.) -Most recent creatinine elevated at 1.8.  Encouraged to hydrate well -On Eliquis for permanent atrial fibrillation followed by Dr. Einar Gip from cardiology.  Has some stable anemia on labs, will add iron studies, B12, and folate to next lab visit.   PLAN: -Referral to general surgery -Discussed at multidisciplinary conference next week -Order MMR/foundation 1 by nurse navigator -Follow-up in 2 to 3 weeks more detailed discussion about treatment options -Had a repeat BMP performed yesterday.  Will arrange for CBC, CMP, iron studies, ferritin, F79, folic acid on next lab visit.   The patient voices understanding of current disease status and treatment options and is in agreement with the current care plan.  All questions were answered. The patient knows to call the clinic with any problems, questions or concerns. We can certainly see the patient much sooner if necessary.  Thank you so much for allowing me to participate in the care of [name]. I will continue to follow up the patient with you and assist in his care.  Disclaimer: This note was dictated with voice recognition software. Similar sounding words can inadvertently be  transcribed and may not be corrected upon review.   Anik Wesch L Aolani Piggott January 31, 2022, 12:54 PM  Addendum I have seen the patient, examined him. I agree with the assessment and and plan and have edited the notes.   89 yo male with PMH of HTN, AF on Eliquis, CKD, presented with abnormal liver function and weight loss.  I have reviewed his CT, and MRI scan images, endoscopy findings, and the biopsy results, and discussed with patient and his family in details.  He appears to have early stage ampullary adenocarcinoma, no nodal or distant metastasis on images, will complete a CT chest for staging.  I discussed that this is resectable disease, however it will require a Whipple surgery, which will be a challenge due to his age and medical comorbidities.  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  We discussed nonsurgical options, including radiation, and systemic treatment.  I will request MMR and Foundation One on his biopsy sample, to see if he is a candidate for immunotherapy and targeted therapy.  Due to his advanced age, and medical comorbidities, cytotoxic hemotherapy would be difficult for him.  I plan to see him back in about 2 weeks after the above work up. Will refer her to pancreatobiliary surgeon Dr. Zenia Resides or Dr. Barry Dienes for surgical consult. Will review his case in our GI conference next week.   Truitt Merle MD 01/31/2022

## 2022-01-31 ENCOUNTER — Inpatient Hospital Stay: Payer: PPO | Attending: Physician Assistant | Admitting: Physician Assistant

## 2022-01-31 ENCOUNTER — Other Ambulatory Visit: Payer: Self-pay

## 2022-01-31 VITALS — BP 121/55 | HR 46 | Temp 97.7°F | Resp 18 | Wt 233.0 lb

## 2022-01-31 DIAGNOSIS — E039 Hypothyroidism, unspecified: Secondary | ICD-10-CM | POA: Diagnosis not present

## 2022-01-31 DIAGNOSIS — I4821 Permanent atrial fibrillation: Secondary | ICD-10-CM | POA: Insufficient documentation

## 2022-01-31 DIAGNOSIS — Z87891 Personal history of nicotine dependence: Secondary | ICD-10-CM | POA: Diagnosis not present

## 2022-01-31 DIAGNOSIS — Z7901 Long term (current) use of anticoagulants: Secondary | ICD-10-CM | POA: Diagnosis not present

## 2022-01-31 DIAGNOSIS — N189 Chronic kidney disease, unspecified: Secondary | ICD-10-CM | POA: Insufficient documentation

## 2022-01-31 DIAGNOSIS — R7989 Other specified abnormal findings of blood chemistry: Secondary | ICD-10-CM | POA: Diagnosis not present

## 2022-01-31 DIAGNOSIS — C241 Malignant neoplasm of ampulla of Vater: Secondary | ICD-10-CM | POA: Insufficient documentation

## 2022-01-31 DIAGNOSIS — R0989 Other specified symptoms and signs involving the circulatory and respiratory systems: Secondary | ICD-10-CM | POA: Insufficient documentation

## 2022-01-31 DIAGNOSIS — Z8719 Personal history of other diseases of the digestive system: Secondary | ICD-10-CM | POA: Diagnosis not present

## 2022-01-31 DIAGNOSIS — I129 Hypertensive chronic kidney disease with stage 1 through stage 4 chronic kidney disease, or unspecified chronic kidney disease: Secondary | ICD-10-CM | POA: Insufficient documentation

## 2022-01-31 NOTE — Progress Notes (Signed)
Email to [facility] pathology Rhonda/kal requesting MMR testing. [place] [facility] testing via [facility] online portal.

## 2022-01-31 NOTE — Progress Notes (Signed)
I met with Carl Woods, his wife, son, and daughter after  his consultation with  Cassie Heilingoetter, PA-C and Dr Burr Medico.  I explained my role as a nurse navigator and provided my contact information. I explained the services provided at [facility] and provided written information.  I explained the alight grant and let  them know one of the financial advisors will reach out to  him should he get treatment here. All questions were answered. They verbalized understanding.

## 2022-01-31 NOTE — Patient Instructions (Addendum)
It was very nice meeting you all today.  I know we covered a lot of important information.  Here is a summary of the main take away points: -It looks like there is a small tumor (about 1.5 cm in the largest dimension) at the ampulla.  The ampulla is the area where the bile duct, pancreatic duct meet into the small intestines.  Overall this is a rare kind of cancer.  Looks like this is early stage. -The best treatment for possible cure for early-stage ampullary cancer is surgery.  Unfortunately, because this is a tricky area with a lot of important structures in that area, the surgery is pretty extensive with a long recovery and aftercare.  We will discuss your case at the multidisciplinary conference next week and get everyone's opinion.  Will also refer you to a surgeon to at least have a discussion about the logistics of the surgery.  Be on the look out for phone call from their office. -In the meantime, we will can run some special test on the sample that Dr. Rush Landmark got from your procedure.  This test may take 1 to 2 weeks to come back.  This helps [place] determine what other options for treatment you may be candidate for.  We are going to check to see if you are a candidate for immunotherapy.  Immunotherapy would likely be tolerable and effective, however, not many patients are a candidate for this.  This test will look for a marker/feature to see if you are a candidate for this or not.  -We will also run some special test to see if there is any other treatment options available that we have pills for.  Overall, the pills are likely not the best option in terms of effectiveness.  -If you are not a candidate for surgery or immunotherapy, we could always discuss the option of radiation.  Radiation oncology is in the basement of this building.  They will basically send a beam of radiation localized to the cancer in the ampulla.  Side effects related to radiation can include such as nausea and gastrointestinal  upset. -If you undergo radiation, Dr. Burr Medico may discuss the option of adding chemotherapy with radiation such as 1 low-dose chemo medicine that is IV or a chemo pill.  However you may be a borderline candidate for the pill because of the kidney function. I want you to follow closely with your family doctor for your kidney.  Please also try to hydrate which is good for the kidneys. -Will plan to see you back in approximately 2 to 3 weeks after your consultation with surgery and we will have more information at that time about what you are a candidate for.  -At some point in the future, that stent in the bile duct will need to be exchanged.  Another thing to keep note of is your CT scan that your primary doctor ordered did show you have a compression fracture in your low back.  -Somebody should call you from the scanning department to schedule a CT scan of the chest to complete the staging workup.  However I would recommend calling them to get this scheduled at [phone] just to ensure that this gets completed.  If you need to call them, to stay on the line and to talk to a life person and tell them your name and date of birth and that you are trying to schedule your CT scan.  They will be able to open your chart with  your name and date of birth and they will be able to take care of you from there. -Do not hesitate to reach out to [place] for any questions.  You can send [place] a MyChart message or call us at [phone] and asked to talk to [facility] or Dr. Ernestina Penna nurse, or Ihor Gully.  -Also please ignore any typos that may be on this summary (using voice recognition typing software that is not always perfect haha :) )

## 2022-02-03 NOTE — Progress Notes (Signed)
Referral, demographics, insurance information, and consult note faxed to [facility]. 

## 2022-02-05 ENCOUNTER — Other Ambulatory Visit: Payer: Self-pay

## 2022-02-05 NOTE — Progress Notes (Signed)
The proposed treatment discussed in conference is for discussion purpose only and is not a binding recommendation.  The patients have not been physically examined, or presented with their treatment options.  Therefore, final treatment plans cannot be decided.  

## 2022-02-07 ENCOUNTER — Ambulatory Visit (HOSPITAL_COMMUNITY)
Admission: RE | Admit: 2022-02-07 | Discharge: 2022-02-07 | Disposition: A | Discharge status: Home / Self Care | Payer: PPO | Source: Ambulatory Visit | Attending: Gastroenterology | Admitting: Gastroenterology

## 2022-02-07 ENCOUNTER — Other Ambulatory Visit: Payer: Self-pay | Admitting: Gastroenterology

## 2022-02-07 DIAGNOSIS — C801 Malignant (primary) neoplasm, unspecified: Secondary | ICD-10-CM | POA: Diagnosis not present

## 2022-02-07 DIAGNOSIS — J90 Pleural effusion, not elsewhere classified: Secondary | ICD-10-CM | POA: Diagnosis not present

## 2022-02-07 DIAGNOSIS — R918 Other nonspecific abnormal finding of lung field: Secondary | ICD-10-CM | POA: Diagnosis not present

## 2022-02-07 LAB — POCT I-STAT CREATININE: Creatinine, Ser: 1.9 mg/dL — ABNORMAL HIGH (ref 0.61–1.24)

## 2022-02-07 MED ORDER — IOHEXOL 300 MG/ML  SOLN: 75.0000 mL | Freq: Once | INTRAMUSCULAR | Status: DC | PRN

## 2022-02-10 ENCOUNTER — Telehealth: Payer: Self-pay | Admitting: Hematology

## 2022-02-10 ENCOUNTER — Inpatient Hospital Stay (HOSPITAL_BASED_OUTPATIENT_CLINIC_OR_DEPARTMENT_OTHER): Payer: PPO | Admitting: Hematology

## 2022-02-10 DIAGNOSIS — C241 Malignant neoplasm of ampulla of Vater: Secondary | ICD-10-CM

## 2022-02-10 LAB — SURGICAL PATHOLOGY

## 2022-02-10 NOTE — Progress Notes (Signed)
[facility]   [phone] [phone]   Clinic Follow up Note   Patient Care Team: [name], MD as PCP - General (Internal Medicine) [name], MD as Consulting Physician (General Surgery) [name], MD as Consulting Physician (Gastroenterology) [name], MD as Consulting Physician (Otolaryngology) [name], MD as Consulting Physician (Cardiology) Suzi Roots as Physician Assistant (Dermatology) Malachy Mood, MD as Consulting Physician (Oncology)  Date of Service:  02/10/2022  I connected with Carl Woods on 02/10/2022 at  3:00 PM EST by telephone visit and verified that I am speaking with the correct person using two identifiers.  I discussed the limitations, risks, security and privacy concerns of performing an evaluation and management service by telephone and the availability of in person appointments. I also discussed with the patient that there may be a patient responsible charge related to this service. The patient expressed understanding and agreed to proceed.   Other persons participating in the visit and their role in the encounter:  Daughter  Patient's location:  Home Provider's location:  OFFICE  CHIEF COMPLAINT: f/u of Ampullary Adenocarcinoma   CURRENT THERAPY:  workup  ASSESSMENT & PLAN:  Carl Woods is a 89 y.o. 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  Given the slow growing over 16 years, this is not related to his ampullary carcinoma.  The scan also showed slightly increased right pleural effusion, and a small indeterminate Lesion posterior to the Liver.  I personally reviewed his CT scan images and discussed the findings with  patient and his family. -Given the above findings, I recommend a PET scan for further evaluation.  I discussed the possibility of biopsy if PET scan is suspicious for metastasis. He agrees -He is scheduled to see his surgeon Dr. Donell Beers this Friday.   PLAN: -Discuss  CT chest scan from 02/07/2022 -Recommend a PET Scan to be done in a few weeks -will see him back after PET    SUMMARY OF ONCOLOGIC HISTORY: Oncology History  Cancer of ampulla of Vater (HCC)  12/06/2021 Imaging   CT ABDOMEN PELVIS W CONTRAST   IMPRESSION: 1. There is moderate intrahepatic bile duct dilatation with marked fusiform dilatation of the common bile duct. No CT visible common bile duct stones identified. Differential considerations include a obstructing distal common bile duct stone (not visible by CT), distal CBD stricture, or mass at the level of the ampullary. Consider further evaluation with contrast enhanced MRI/MRCP or ERCP. 2. Contour the liver appears nodular which may reflect underlying cirrhosis. 3. Age-indeterminate compression fracture involving L1 vertebral body with loss of 50% of the vertebral body height. No signs of retropulsion of fracture fragments into the canal. 4. Fat containing umbilical hernia. 5. 5 mm anterolisthesis of L4 on L5. 6.  Aortic Atherosclerosis (ICD10-I70.0).   12/16/2021 Imaging   MR ABDOMEN MRCP W WO CONTAST   IMPRESSION: 1. Moderate intrahepatic and extrahepatic bile duct dilation with smooth tapering to the level of the ampulla without filling defect or focal mass lesion identified. Findings may reflect ampullary stricture or occult ampullary mass. Consider further evaluation with ERCP. 2. Mild dilated main pancreatic duct at the head measuring up to 8 mm. 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 Recommend follow-up pre and post-contrast MRI/MRCP in 2 years. This  recommendation follows ACR consensus guidelines: Management of Incidental Pancreatic Cysts: A White Paper of the ACR Incidental Findings Committee. J Am Coll Radiol 2017;14:911-923. 3.  Aortic Atherosclerosis (ICD10-I70.0).   01/23/2022 Procedure   DG ERCP   IMPRESSION: Dilatation of the extrahepatic bile duct with severe tapering or narrowing in the distal common bile duct. Placement of biliary stent.   These images were submitted for radiologic interpretation only. Please see the procedural report for the amount of contrast.    01/23/2022 Procedure   EUS/ERCP  ENDOSONOGRAPHIC FINDING: There was dilation in the common bile duct (12.3 mm -> 20.4 mm) and in the common hepatic duct (23.0 mm). A small amount of hyperechoic material consistent with sludge was visualized endosonographically in the common bile duct. Moderate hyperechoic material consistent with sludge was visualized endosonographically in the gallbladder with normal gallbladder wall thickness. Pancreatic parenchymal abnormalities were noted in the entire pancreas. These consisted of hyperechoic foci with shadowing and cysts. There was a 9.1 mm by 6.1 mm cyst in the neck of the pancreas. There was a 15.3 mm by 14.1 mm cyst in the tail of the pancreas. The pancreatic duct had a dilated endosonographic appearance, had a prominently branched endosonographic appearance and had hyperechoic walls in the pancreatic head (PD - 8.2 mm -> 4.3 mm), genu of the pancreas (3.5 mm), body of the pancreas (3.2 mm) and tail of the pancreas (2.8 mm). A hypoechoic irregular lesion was identified endosonographically at the ampulla. The lesion measured 15 mm by 13 mm in maximal cross-sectional diameter. The lesion extended from the mucosa to the submucosa. The outer margins were irregular. This is noted where CBD and PD dilation is noted to occur. Endosonographic imaging in the visualized portion of the liver showed no mass.  No  malignant-appearing lymph nodes were visualized in the celiac region (level 20), peripancreatic region and porta hepatis region. The celiac region was visualized.   01/23/2022 Pathology Results   SURGICAL PATHOLOGY  CASE: [id number]  PATIENT: Carl Woods  Surgical Pathology Report   FINAL MICROSCOPIC DIAGNOSIS:   A. STOMACH, RANDOM, BIOPSY:  - Gastric mucosa, no significant abnormality.  No inflammation,  intestinal metaplasia, dysplasia or malignancy.   B. AMPULLARY LESION, BIOPSY:  - Adenocarcinoma.  See comment.    01/31/2022 Initial Diagnosis   Cancer of ampulla of Vater [facility])      INTERVAL HISTORY:  Carl Woods was contacted for a follow up of Ampullary Adenocarcinoma . He was last seen by PA-C Cassie on 01/31/2022. Pt had a question a   All other systems were reviewed with the patient and are negative.  MEDICAL HISTORY:  Past Medical History:  Diagnosis Date   Anal fistula    Arthritis    Fingers and hands   Atrial fibrillation (HCC)    AVM (arteriovenous malformation)    Clipped during Colonoscopy 05/2016   Barrett's esophagus    Bilateral cataracts    BPH (benign prostatic hyperplasia)    Cecal angiodysplasia 05/28/2016   ablated at colonoscopy   Deviated nasal septum    Diverticulosis of sigmoid colon    E. 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  Left Forehead (treatment after biopsy)   OSA on CPAP    Perianal rash    Recurrent epistaxis    Renal cyst 01/04/2013   Small left peripelvic renal cysts , noted on US Renal   SCCA (squamous cell carcinoma) of skin 10/17/2015   Left Sup Bridge of Nose (curet, cautery and 5FU)   Seasonal allergies    Superficial basal cell carcinoma (BCC) 10/17/2015   Left Bulb of Nose (curet, cautery and 5FU)   Tubular adenoma     SURGICAL HISTORY: Past Surgical  History:  Procedure Laterality Date   BILIARY STENT PLACEMENT N/A 01/23/2022   Procedure: BILIARY STENT PLACEMENT;  Surgeon: Lemar Lofty., MD;  Location: [facility];  Service: Gastroenterology;  Laterality: N/A;   BIOPSY  01/23/2022   Procedure: BIOPSY;  Surgeon: Meridee Score Netty Starring., MD;  Location: [facility];  Service: Gastroenterology;;   COLONOSCOPY  multiple   CYSTOSCOPY     ENDOSCOPIC RETROGRADE CHOLANGIOPANCREATOGRAPHY (ERCP) WITH PROPOFOL N/A 01/23/2022   Procedure: ENDOSCOPIC RETROGRADE CHOLANGIOPANCREATOGRAPHY (ERCP) WITH PROPOFOL;  Surgeon: Lemar Lofty., MD;  Location: [facility];  Service: Gastroenterology;  Laterality: N/A;   ESOPHAGOGASTRODUODENOSCOPY  multiple   ESOPHAGOGASTRODUODENOSCOPY N/A 01/23/2022   Procedure: ESOPHAGOGASTRODUODENOSCOPY (EGD);  Surgeon: Lemar Lofty., MD;  Location: [facility];  Service: Gastroenterology;  Laterality: N/A;   EUS N/A 01/23/2022   Procedure: UPPER ENDOSCOPIC ULTRASOUND (EUS) RADIAL;  Surgeon: Lemar Lofty., MD;  Location: [facility];  Service: Gastroenterology;  Laterality: N/A;   EVALUATION UNDER ANESTHESIA WITH FISTULECTOMY N/A 03/02/2018   Procedure: ANORECTAL EXAM UNDER ANESTHESIA WITH REPAIR OF SUPERFICIAL PERIRECTAL FISTULA AND HEMORRHOIDECTOMY;  Surgeon: [name], MD;  Location: [facility];  Service: General;  Laterality: N/A;   EXPLORATORY LAPAROTOMY     REMOVAL OF STONES  01/23/2022   Procedure: REMOVAL OF STONES;  Surgeon: Lemar Lofty., MD;  Location: [facility];  Service: Gastroenterology;;   Dennison Mascot  01/23/2022   Procedure: Dennison Mascot;  Surgeon: Mansouraty, Netty Starring., MD;  Location: [facility];  Service: Gastroenterology;;    I have reviewed the social history and family history with the patient and they are unchanged from previous note.  ALLERGIES:  has No Known Allergies.  MEDICATIONS:  Current Outpatient Medications  Medication Sig  Dispense Refill   apixaban (ELIQUIS) 5 MG TABS tablet Take 1 tablet (5 mg total) by mouth 2 (two) times daily. 60 tablet    Cholecalciferol (VITAMIN D3) 5000 units CAPS Take 1 capsule by mouth every evening.     dapagliflozin propanediol (FARXIGA) 10 MG TABS tablet Take 10 mg by mouth in the morning.     Fe Fum-Fe Poly-Vit C-Lactobac (FUSION) 65-65-25-30 MG CAPS Take 1 capsule by mouth 3 (three) times a week.     fexofenadine (ALLEGRA) 180 MG tablet Take 180 mg by mouth as needed for allergies or rhinitis.     furosemide (LASIX) 20 MG tablet Take 20 mg by mouth in the morning.     Glucosamine-Chondroit-Vit C-Mn (GLUCOSAMINE CHONDR 500 COMPLEX PO) Take 1 tablet by mouth daily.      ketoconazole (NIZORAL) 2 % cream Apply 1 Application topically 2 (two) times daily as needed (skin rash/irritation.).     levothyroxine (SYNTHROID, LEVOTHROID) 112 MCG tablet Take 112 mcg by mouth daily before breakfast.      losartan (COZAAR) 25 MG tablet Take 25 mg by mouth in the morning.     metoprolol succinate (TOPROL-XL) 25 MG 24 hr tablet TAKE 1 TABLET (25 MG TOTAL) BY MOUTH  DAILY. TAKE WITH OR IMMEDIATELY FOLLOWING A MEAL. 90 tablet 3   nitroGLYCERIN (NITROSTAT) 0.4 MG SL tablet Place 1 tablet (0.4 mg total) under the tongue every 5 (five) minutes as needed for up to 25 days for chest pain. 25 tablet 3   omeprazole (PRILOSEC) 20 MG capsule Take 1 capsule (20 mg total) by mouth 2 (two) times daily before a meal. Twice daily for 1 month.  Then return back to daily dosing. 60 capsule 6   rosuvastatin (CRESTOR) 5 MG tablet TAKE 1 TABLET (5 MG TOTAL) BY MOUTH DAILY. (Patient not taking: Reported on 01/17/2022) 90 tablet 0   sodium chloride (OCEAN) 0.65 % SOLN nasal spray Place 1 spray into both nostrils as needed for congestion.     No current facility-administered medications for this visit.    PHYSICAL EXAMINATION: ECOG PERFORMANCE STATUS: 1 - Symptomatic but completely ambulatory  There were no vitals filed  for this visit. Wt Readings from Last 3 Encounters:  01/31/22 233 lb (105.7 kg)  01/23/22 225 lb (102.1 kg)  12/10/21 222 lb 8 oz (100.9 kg)     No vitals taken today, Exam not performed today  LABORATORY DATA:  I have reviewed the data as listed    Latest Ref Rng & Units 12/25/2021    3:07 PM 12/10/2021    9:37 AM 02/23/2018    1:45 PM  CBC  WBC 4.0 - 10.5 K/uL 5.8  6.1  7.1   Hemoglobin 13.0 - 17.0 g/dL 16.1  09.6  04.5   Hematocrit 39.0 - 52.0 % 31.6  31.0  36.3   Platelets 150.0 - 400.0 K/uL 158.0  135.0  160         Latest Ref Rng & Units 02/07/2022    1:10 PM 01/30/2022    1:28 PM 01/15/2022   11:43 AM  CMP  Glucose 70 - 99 mg/dL   409   BUN 6 - 23 mg/dL   26   Creatinine 8.11 - 1.24 mg/dL 9.14   7.82   Sodium 956 - 145 mEq/L   140   Potassium 3.5 - 5.1 mEq/L   4.3   Chloride 96 - 112 mEq/L   107   CO2 19 - 32 mEq/L   24   Calcium 8.4 - 10.5 mg/dL   9.4   Total Protein 6.0 - 8.3 g/dL  6.7  7.0   Total Bilirubin 0.2 - 1.2 mg/dL  0.6  0.9   Alkaline Phos 39 - 117 U/L  80  109   AST 0 - 37 U/L  19  41   ALT 0 - 53 U/L  15  30       RADIOGRAPHIC STUDIES: I have personally reviewed the radiological images as listed and agreed with the findings in the report. No results found.    Orders Placed This Encounter  Procedures   NM PET Image Initial (PI) Skull Base To Thigh    Standing Status:   Future    Standing Expiration Date:   02/10/2023    Order Specific Question:   If indicated for the ordered procedure, I authorize the administration of a radiopharmaceutical per Radiology protocol    Answer:   Yes    Order Specific Question:   Preferred imaging location?    Answer:   [facility]   All questions were answered. The patient knows to call the clinic with any problems, questions or concerns. No barriers to learning was detected. The total time spent  in the appointment was 22 minutes.     Malachy Mood, MD 02/10/2022   Carolin Coy am acting as scribe for  Malachy Mood, MD.   I have reviewed the above documentation for accuracy and completeness, and I agree with the above.

## 2022-02-10 NOTE — Telephone Encounter (Signed)
Called patient regarding upcoming January appointments, patient has been called and voicemail was left.

## 2022-02-11 ENCOUNTER — Ambulatory Visit (HOSPITAL_COMMUNITY): Payer: PPO

## 2022-02-11 ENCOUNTER — Encounter: Payer: Self-pay | Admitting: Hematology

## 2022-02-11 NOTE — Assessment & Plan Note (Signed)
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  Given the slow growing over 16 years, this is not related to his ampullary carcinoma.  The scan also showed slightly increased right pleural effusion, and a small indeterminate Lesion posterior to the Liver.  I personally reviewed his CT scan images and discussed the findings with patient and his family. -Given the above findings, I recommend a PET scan for further evaluation.  I discussed the possibility of biopsy if PET scan is suspicious for metastasis. He agrees -He is scheduled to see his surgeon Dr. Barry Dienes this Friday.

## 2022-02-11 NOTE — Progress Notes (Incomplete)
[place]   [phone] Fax:(336) [phone]   Clinic Follow up Note   Patient Care Team: Merrilee Seashore, MD as PCP - General (Internal Medicine) Michael Boston, MD as Consulting Physician (General Surgery) Gatha Mayer, MD as Consulting Physician (Gastroenterology) Izora Gala, MD as Consulting Physician (Otolaryngology) Adrian Prows, MD as Consulting Physician (Cardiology) Starlyn Skeans as Physician Assistant (Dermatology) Truitt Merle, MD as Consulting Physician (Oncology)  Date of Service:  02/10/2022  I connected with [name] on 02/10/2022 at  3:00 PM EST by telephone visit and verified that I am speaking with the correct person using two identifiers.  I discussed the limitations, risks, security and privacy concerns of performing an evaluation and management service by telephone and the availability of in person appointments. I also discussed with the patient that there may be a patient responsible charge related to this service. The patient expressed understanding and agreed to proceed.   Other persons participating in the visit and their role in the encounter:  Daughter  Patient's location:  Home Provider's location:  OFFICE  CHIEF COMPLAINT: f/u of Ampullary Adenocarcinoma   CURRENT THERAPY:  workup  ASSESSMENT & PLAN:  Carl Woods is a 89 y.o. male with      PLAN: -Discuss  CT scan from 02/07/2022 -Recommend a PET Scan/Order     No problem-specific Assessment & Plan notes found for this encounter.    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 There is moderate intrahepatic bile duct dilatation with marked fusiform dilatation of the common bile duct. No CT visible common bile duct stones identified. Differential considerations include a obstructing distal common bile duct stone (not visible by  CT), distal CBD stricture, or mass at the level of the ampullary. Consider further evaluation with contrast enhanced MRI/MRCP or ERCP. 2. Contour the liver appears nodular which may reflect underlying cirrhosis. 3. Age-indeterminate compression fracture involving L1 vertebral body with loss of 50% of the vertebral body height. No signs of retropulsion of fracture fragments into the canal. 4. Fat containing umbilical hernia. 5. 5 mm anterolisthesis of L4 on L5. 6.  Aortic Atherosclerosis (ICD10-I70.0).   12/16/2021 Imaging   MR ABDOMEN MRCP W WO CONTAST   IMPRESSION: 1. Moderate intrahepatic and extrahepatic bile duct dilation with smooth tapering to the level of the ampulla without filling defect or focal mass lesion identified. Findings may reflect ampullary stricture or occult ampullary mass. Consider further evaluation with ERCP. 2. Mild dilated main pancreatic duct at the head measuring up to 8 mm. 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 Recommend follow-up pre and post-contrast MRI/MRCP in 2 years. This recommendation follows ACR consensus guidelines: Management of Incidental Pancreatic Cysts: A White Paper of the ACR Incidental Findings Committee. J Am Coll Radiol 7591;63:846-659. 3.  Aortic Atherosclerosis (ICD10-I70.0).   01/23/2022 Procedure   DG ERCP   IMPRESSION: Dilatation of the extrahepatic bile duct with severe tapering or narrowing in the distal common bile duct. Placement of biliary stent.   These images were submitted for radiologic interpretation only. Please see the procedural report for the amount of contrast.    01/23/2022 Procedure   EUS/ERCP  ENDOSONOGRAPHIC FINDING: There was dilation in the common bile duct (12.3 mm -> 20.4 mm) and in the common hepatic duct (23.0  mm). A small amount of hyperechoic material consistent with sludge was visualized  endosonographically in the common bile duct. Moderate hyperechoic material consistent with sludge was visualized endosonographically in the gallbladder with normal gallbladder wall thickness. Pancreatic parenchymal abnormalities were noted in the entire pancreas. These consisted of hyperechoic foci with shadowing and cysts. There was a 9.1 mm by 6.1 mm cyst in the neck of the pancreas. There was a 15.3 mm by 14.1 mm cyst in the tail of the pancreas. The pancreatic duct had a dilated endosonographic appearance, had a prominently branched endosonographic appearance and had hyperechoic walls in the pancreatic head (PD - 8.2 mm -> 4.3 mm), genu of the pancreas (3.5 mm), body of the pancreas (3.2 mm) and tail of the pancreas (2.8 mm). A hypoechoic irregular lesion was identified endosonographically at the ampulla. The lesion measured 15 mm by 13 mm in maximal cross-sectional diameter. The lesion extended from the mucosa to the submucosa. The outer margins were irregular. This is noted where CBD and PD dilation is noted to occur. Endosonographic imaging in the visualized portion of the liver showed no mass.  No malignant-appearing lymph nodes were visualized in the celiac region (level 20), peripancreatic region and porta hepatis region. The celiac region was visualized.   01/23/2022 Pathology Results   SURGICAL PATHOLOGY  CASE: [id number]  PATIENT: [name]  Surgical Pathology Report   FINAL MICROSCOPIC DIAGNOSIS:   A. STOMACH, RANDOM, BIOPSY:  - Gastric mucosa, no significant abnormality.  No inflammation,  intestinal metaplasia, dysplasia or malignancy.   B. AMPULLARY LESION, BIOPSY:  - Adenocarcinoma.  See comment.    01/31/2022 Initial Diagnosis   Cancer of ampulla of Vater [facility])      INTERVAL HISTORY: *** Carl Woods was contacted for a follow up of Ampullary Adenocarcinoma . He was last seen by PA-C Cassie on 01/31/2022. Pt had a question a   All other systems were  reviewed with the patient and are negative.  MEDICAL HISTORY:  Past Medical History:  Diagnosis Date  . Anal fistula   . Arthritis    Fingers and hands  . Atrial fibrillation ([place])   . AVM (arteriovenous malformation)    Clipped during Colonoscopy 05/2016  . Barrett's esophagus   . Bilateral cataracts   . BPH (benign prostatic hyperplasia)   . 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 Deviated nasal septum   . Diverticulosis of sigmoid colon   . E. coli infection   . Fatty liver   . GERD (gastroesophageal reflux disease)   . History of colon polyps   . Hypothyroidism   . Iron deficiency anemia   . 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 OSA on CPAP   . Perianal rash   . Recurrent epistaxis   . Renal cyst 01/04/2013   Small left peripelvic renal cysts , noted on US Renal  . SCCA (squamous cell carcinoma) of skin 10/17/2015   Left Sup Bridge of Nose (curet, cautery and 5FU)  . Seasonal allergies   . Superficial basal cell carcinoma (BCC) 10/17/2015   Left Bulb of Nose (curet, cautery and 5FU)  . Tubular adenoma     SURGICAL HISTORY: Past Surgical History:  Procedure Laterality Date  . BILIARY STENT PLACEMENT N/A 01/23/2022   Procedure: BILIARY STENT PLACEMENT;  Surgeon: Rush Landmark Telford Nab., MD;  Location: [facility];  Service: Gastroenterology;  Laterality: N/A;  . BIOPSY  01/23/2022   Procedure:  BIOPSY;  Surgeon: Irving Copas., MD;  Location: [facility];  Service: Gastroenterology;;  . COLONOSCOPY  multiple  . CYSTOSCOPY    . ENDOSCOPIC RETROGRADE CHOLANGIOPANCREATOGRAPHY (ERCP) WITH PROPOFOL N/A 01/23/2022   Procedure: ENDOSCOPIC RETROGRADE CHOLANGIOPANCREATOGRAPHY (ERCP) WITH PROPOFOL;  Surgeon: Rush Landmark Telford Nab., MD;  Location: [facility];  Service: Gastroenterology;  Laterality: N/A;  . ESOPHAGOGASTRODUODENOSCOPY  multiple  . ESOPHAGOGASTRODUODENOSCOPY N/A 01/23/2022    Procedure: ESOPHAGOGASTRODUODENOSCOPY (EGD);  Surgeon: Irving Copas., MD;  Location: [facility];  Service: Gastroenterology;  Laterality: N/A;  . EUS N/A 01/23/2022   Procedure: UPPER ENDOSCOPIC ULTRASOUND (EUS) RADIAL;  Surgeon: Rush Landmark Telford Nab., MD;  Location: [facility];  Service: Gastroenterology;  Laterality: N/A;  . EVALUATION UNDER ANESTHESIA WITH FISTULECTOMY N/A 03/02/2018   Procedure: ANORECTAL EXAM UNDER ANESTHESIA WITH REPAIR OF SUPERFICIAL PERIRECTAL FISTULA AND HEMORRHOIDECTOMY;  Surgeon: Michael Boston, MD;  Location: [facility];  Service: General;  Laterality: N/A;  . EXPLORATORY LAPAROTOMY    . REMOVAL OF STONES  01/23/2022   Procedure: REMOVAL OF STONES;  Surgeon: Rush Landmark Telford Nab., MD;  Location: [facility];  Service: Gastroenterology;;  . Joan Mayans  01/23/2022   Procedure: Joan Mayans;  Surgeon: Mansouraty, Telford Nab., MD;  Location: [facility];  Service: Gastroenterology;;    I have reviewed the social history and family history with the patient and they are unchanged from previous note.  ALLERGIES:  has No Known Allergies.  MEDICATIONS:  Current Outpatient Medications  Medication Sig Dispense Refill  . apixaban (ELIQUIS) 5 MG TABS tablet Take 1 tablet (5 mg total) by mouth 2 (two) times daily. 60 tablet   . Cholecalciferol (VITAMIN D3) 5000 units CAPS Take 1 capsule by mouth every evening.    . dapagliflozin propanediol (FARXIGA) 10 MG TABS tablet Take 10 mg by mouth in the morning.    . Fe Fum-Fe Poly-Vit C-Lactobac (FUSION) 65-65-25-30 MG CAPS Take 1 capsule by mouth 3 (three) times a week.    . fexofenadine (ALLEGRA) 180 MG tablet Take 180 mg by mouth as needed for allergies or rhinitis.    . furosemide (LASIX) 20 MG tablet Take 20 mg by mouth in the morning.    . Glucosamine-Chondroit-Vit C-Mn (GLUCOSAMINE CHONDR 500 COMPLEX PO) Take 1 tablet by mouth daily.     Marland Kitchen ketoconazole (NIZORAL) 2 % cream Apply 1 Application topically 2  (two) times daily as needed (skin rash/irritation.).    Marland Kitchen levothyroxine (SYNTHROID, LEVOTHROID) 112 MCG tablet Take 112 mcg by mouth daily before breakfast.     . losartan (COZAAR) 25 MG tablet Take 25 mg by mouth in the morning.    . metoprolol succinate (TOPROL-XL) 25 MG 24 hr tablet TAKE 1 TABLET (25 MG TOTAL) BY MOUTH DAILY. TAKE WITH OR IMMEDIATELY FOLLOWING A MEAL. 90 tablet 3  . nitroGLYCERIN (NITROSTAT) 0.4 MG SL tablet Place 1 tablet (0.4 mg total) under the tongue every 5 (five) minutes as needed for up to 25 days for chest pain. 25 tablet 3  . omeprazole (PRILOSEC) 20 MG capsule Take 1 capsule (20 mg total) by mouth 2 (two) times daily before a meal. Twice daily for 1 month.  Then return back to daily dosing. 60 capsule 6  . rosuvastatin (CRESTOR) 5 MG tablet TAKE 1 TABLET (5 MG TOTAL) BY MOUTH DAILY. (Patient not taking: Reported on 01/17/2022) 90 tablet 0  . sodium chloride (OCEAN) 0.65 % SOLN nasal spray Place 1 spray into both nostrils as needed for congestion.     No current facility-administered medications  for this visit.    PHYSICAL EXAMINATION: ECOG PERFORMANCE STATUS: {CHL ONC ECOG PS:[id number]}  There were no vitals filed for this visit. Wt Readings from Last 3 Encounters:  01/31/22 233 lb (105.7 kg)  01/23/22 225 lb (102.1 kg)  12/10/21 222 lb 8 oz (100.9 kg)     No vitals taken today, Exam not performed today  LABORATORY DATA:  I have reviewed the data as listed    Latest Ref Rng & Units 12/25/2021    3:07 PM 12/10/2021    9:37 AM 02/23/2018    1:45 PM  CBC  WBC 4.0 - 10.5 K/uL 5.8  6.1  7.1   Hemoglobin 13.0 - 17.0 g/dL 10.6  10.4  11.6   Hematocrit 39.0 - 52.0 % 31.6  31.0  36.3   Platelets 150.0 - 400.0 K/uL 158.0  135.0  160         Latest Ref Rng & Units 02/07/2022    1:10 PM 01/30/2022    1:28 PM 01/15/2022   11:43 AM  CMP  Glucose 70 - 99 mg/dL   117   BUN 6 - 23 mg/dL   26   Creatinine 0.61 - 1.24 mg/dL 1.90   1.80   Sodium 135 - 145 mEq/L    140   Potassium 3.5 - 5.1 mEq/L   4.3   Chloride 96 - 112 mEq/L   107   CO2 19 - 32 mEq/L   24   Calcium 8.4 - 10.5 mg/dL   9.4   Total Protein 6.0 - 8.3 g/dL  6.7  7.0   Total Bilirubin 0.2 - 1.2 mg/dL  0.6  0.9   Alkaline Phos 39 - 117 U/L  80  109   AST 0 - 37 U/L  19  41   ALT 0 - 53 U/L  15  30       RADIOGRAPHIC STUDIES: I have personally reviewed the radiological images as listed and agreed with the findings in the report. No results found.    No orders of the defined types were placed in this encounter.  All questions were answered. The patient knows to call the clinic with any problems, questions or concerns. No barriers to learning was detected. The total time spent in the appointment was {CHL ONC TIME VISIT - [id number]}.     Baldemar Friday, CMA 02/10/2022   Felicity Coyer am acting as scribe for Truitt Merle, MD.   {Add scribe attestation statement}

## 2022-02-11 NOTE — Progress Notes (Signed)
I spoke with Carl Woods and let him know I have scheduled a Pet scan for him on 1/18 at [facility] arrive 1300 for 1330 scan.  Nothing to eat or drink except sips of water after 0530.  I told him not to take his [place] that am.  All questions were answered.  He verbalized understanding.

## 2022-02-13 ENCOUNTER — Ambulatory Visit (HOSPITAL_COMMUNITY)
Admission: RE | Admit: 2022-02-13 | Discharge: 2022-02-13 | Disposition: A | Discharge status: Home / Self Care | Payer: PPO | Source: Ambulatory Visit | Attending: Hematology | Admitting: Hematology

## 2022-02-13 DIAGNOSIS — J929 Pleural plaque without asbestos: Secondary | ICD-10-CM | POA: Diagnosis not present

## 2022-02-13 DIAGNOSIS — J90 Pleural effusion, not elsewhere classified: Secondary | ICD-10-CM | POA: Diagnosis not present

## 2022-02-13 DIAGNOSIS — K7460 Unspecified cirrhosis of liver: Secondary | ICD-10-CM | POA: Diagnosis not present

## 2022-02-13 DIAGNOSIS — I2510 Atherosclerotic heart disease of native coronary artery without angina pectoris: Secondary | ICD-10-CM | POA: Diagnosis not present

## 2022-02-13 DIAGNOSIS — C241 Malignant neoplasm of ampulla of Vater: Secondary | ICD-10-CM | POA: Diagnosis not present

## 2022-02-13 LAB — GLUCOSE, CAPILLARY: Glucose-Capillary: 109 mg/dL — ABNORMAL HIGH (ref 70–99)

## 2022-02-13 MED ORDER — FLUDEOXYGLUCOSE F - 18 (FDG) INJECTION
12.0000 | Freq: Once | INTRAVENOUS | Status: AC | PRN
  Administered 2022-02-13: 11.5 via INTRAVENOUS

## 2022-02-14 ENCOUNTER — Encounter (HOSPITAL_COMMUNITY): Payer: Self-pay | Admitting: Hematology

## 2022-02-14 DIAGNOSIS — I4891 Unspecified atrial fibrillation: Secondary | ICD-10-CM | POA: Diagnosis not present

## 2022-02-14 DIAGNOSIS — C241 Malignant neoplasm of ampulla of Vater: Secondary | ICD-10-CM | POA: Diagnosis not present

## 2022-02-14 DIAGNOSIS — N1832 Chronic kidney disease, stage 3b: Secondary | ICD-10-CM | POA: Diagnosis not present

## 2022-02-14 DIAGNOSIS — D500 Iron deficiency anemia secondary to blood loss (chronic): Secondary | ICD-10-CM | POA: Diagnosis not present

## 2022-02-17 ENCOUNTER — Telehealth: Payer: Self-pay | Admitting: Radiation Oncology

## 2022-02-17 ENCOUNTER — Other Ambulatory Visit: Payer: Self-pay

## 2022-02-17 DIAGNOSIS — C241 Malignant neoplasm of ampulla of Vater: Secondary | ICD-10-CM

## 2022-02-17 NOTE — Progress Notes (Signed)
GI Location of Tumor / Histology: Ampullary Carcinoma  Carl Woods presented with weight loss.  PET 02/13/2022:  Isolated hypermetabolism in the region of the ampulla, likely the site of primary.  No evidence of tracer avid metastatic disease.  CT Chest 02/07/2022: 4.1 cm pericardial lymph node/nodule, which was 1.9 cm in 2008.  ERCP 01/23/2022: Dilatation of the extrahepatic bile duct with severe tapering or narrowing in the distal common bile duct.  Placement of biliary stent.    CT AP 12/06/2021: There is moderate intrahepatic bile duct dilatation with marked fusiform dilatation of the common bile duct. No CT visible common bile duct stones identified. Differential considerations include a obstructing distal common bile duct stone (not visible by CT), distal CBD stricture, or mass at the level of the ampullary.    Biopsies of Ampullary Lesion 01/23/2022    Past/Anticipated interventions by surgeon, if any:  Dr. Barry Dienes -I reviewed surgery with the patient and his son including diagrams of anatomy. Reviewed risks of complications.  -He is at much higher than average risk of complication without incorporating into the data his atrial fibrillation, chronic anticoagulation, chronic kidney disease, and age. -Surgery is certainly the only potentially curative treatment for this disease, but there are prohibitive risks for him. There are other treatments possible. Certainly given how he feels with his blood work, observation is a reasonable choice as well.  -Not a surgical candidate.   Past/Anticipated interventions by medical oncology, if any:  Dr. Burr Medico 02/21/2022   Weight changes, if any: He notes a couple pound weight gain since his procedure in December 2023.  Bowel/Bladder complaints, if any: He reports having more gas.  Denies constipation or diarrhea.  Denies urinary changes.  Nausea / Vomiting, if any: Denies nausea or vomiting.  Pain issues, if any: He reports lower abdomen pain  when he has gas.   Appetite: He reports he is eating and drinking well.      SAFETY ISSUES: Prior radiation? No Pacemaker/ICD? No Possible current pregnancy? N/a Is the patient on methotrexate? No   Current Complaints/Details:

## 2022-02-17 NOTE — Progress Notes (Signed)
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   HISTORY OF PRESENT ILLNESS: Carl Woods is a 89 y.o. 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 The patient presented with abdominal pain and changes in his liver enzymes in November 2023. A CT at that time showed intrahepatic bile duct dilatation and marked dilation of the common bile duct. MRCP on 12/16/21 showed mild intrahepatic and extrahepatic bile duct dilation with smooth tapering to the level of the ampulla without filling defect or focal mass lesion. Mild dilated main pancreatic duct at the head measuring up to 8 mm. Multiple cystic foci throughout the pancreas measuring up to 1.7 cm in the tail was noted. He proceeded with ERCP on 01/23/22 and plastic stent placement. 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 A random stomach biopsy showed gastric mucosa without abnormality. A CT chest on 02/07/22 showed an increase in a node in the anterior cardiophrenic space on the right, and an increase in the right pleural effusion compared to the previous MRI. Subtle nodular tissue along the margin of the right hepatic lobe in the upper abdomen was noted posteriorly. He met with Dr. Burr Medico and a PET scan on 02/13/21 showed hypermetabolic change in the ampulla without evidence of metastatic disease. A right cardiophrenic angle lesion was noted and consistent with a pericardial cyst. 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   He met with Dr. Barry Dienes and the patient is not felt to be a good surgical candidate. Rather,  he is seeing Dr. Burr Medico to determine next steps of treatment. He is seen to discuss chemoradiation for his cancer.   PREVIOUS RADIATION THERAPY: No   PAST MEDICAL HISTORY:  Past Medical History:  Diagnosis Date   Anal fistula    Arthritis    Fingers and hands   Atrial fibrillation ([place])    AVM (arteriovenous malformation)    Clipped during Colonoscopy 05/2016   Barrett's esophagus    Bilateral cataracts    BPH (benign prostatic hyperplasia)    Cecal angiodysplasia 05/28/2016   ablated at colonoscopy   Deviated nasal septum    Diverticulosis of sigmoid colon    E. 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   Left Forehead (treatment after biopsy)   OSA on CPAP    Perianal rash    Recurrent epistaxis    Renal cyst 01/04/2013   Small left peripelvic renal cysts , noted on US Renal   SCCA (squamous cell carcinoma) of skin 10/17/2015   Left Sup Bridge of Nose (curet, cautery and 5FU)   Seasonal allergies    Superficial basal cell carcinoma (BCC) 10/17/2015   Left Bulb of Nose (curet, cautery and 5FU)   Tubular adenoma        PAST SURGICAL HISTORY: Past Surgical History:  Procedure Laterality Date   BILIARY STENT PLACEMENT N/A 01/23/2022   Procedure: BILIARY  STENT PLACEMENT;  Surgeon: Rush Landmark Telford Nab., MD;  Location: [facility];  Service: Gastroenterology;  Laterality: N/A;   BIOPSY  01/23/2022   Procedure: BIOPSY;  Surgeon: Rush Landmark Telford Nab., MD;  Location: [facility];  Service: Gastroenterology;;   COLONOSCOPY  multiple   CYSTOSCOPY     ENDOSCOPIC RETROGRADE CHOLANGIOPANCREATOGRAPHY (ERCP) WITH PROPOFOL N/A 01/23/2022   Procedure: ENDOSCOPIC RETROGRADE CHOLANGIOPANCREATOGRAPHY (ERCP) WITH PROPOFOL;  Surgeon: Irving Copas., MD;  Location: [facility];  Service: Gastroenterology;  Laterality: N/A;    ESOPHAGOGASTRODUODENOSCOPY  multiple   ESOPHAGOGASTRODUODENOSCOPY N/A 01/23/2022   Procedure: ESOPHAGOGASTRODUODENOSCOPY (EGD);  Surgeon: Irving Copas., MD;  Location: [facility];  Service: Gastroenterology;  Laterality: N/A;   EUS N/A 01/23/2022   Procedure: UPPER ENDOSCOPIC ULTRASOUND (EUS) RADIAL;  Surgeon: Irving Copas., MD;  Location: [facility];  Service: Gastroenterology;  Laterality: N/A;   EVALUATION UNDER ANESTHESIA WITH FISTULECTOMY N/A 03/02/2018   Procedure: ANORECTAL EXAM UNDER ANESTHESIA WITH REPAIR OF SUPERFICIAL PERIRECTAL FISTULA AND HEMORRHOIDECTOMY;  Surgeon: Michael Boston, MD;  Location: [facility];  Service: General;  Laterality: N/A;   EXPLORATORY LAPAROTOMY     REMOVAL OF STONES  01/23/2022   Procedure: REMOVAL OF STONES;  Surgeon: Irving Copas., MD;  Location: [facility];  Service: Gastroenterology;;   Joan Mayans  01/23/2022   Procedure: Joan Mayans;  Surgeon: Mansouraty, Telford Nab., MD;  Location: [facility];  Service: Gastroenterology;;     FAMILY HISTORY:  Family History  Problem Relation Age of Onset   Heart disease Mother    Other Father        old age   Ovarian cancer Sister    Colon cancer Neg Hx    Stomach cancer Neg Hx    Rectal cancer Neg Hx    Esophageal cancer Neg Hx    Liver cancer Neg Hx      SOCIAL HISTORY:  reports that he quit smoking about 18 years ago. His smoking use included cigarettes. He has a 20.00 pack-year smoking history. He has never used smokeless tobacco. He reports current alcohol use. He reports that he does not use drugs. The patient is married and lives in [place]. He cares for his wife who has dementia. He's accompanied by his son.   ALLERGIES: Patient has no known allergies.   MEDICATIONS:  Current Outpatient Medications  Medication Sig Dispense Refill   apixaban (ELIQUIS) 5 MG TABS tablet Take 1 tablet (5 mg total) by mouth 2 (two) times daily. 60 tablet    Cholecalciferol  (VITAMIN D3) 5000 units CAPS Take 1 capsule by mouth every evening.     dapagliflozin propanediol (FARXIGA) 10 MG TABS tablet Take 10 mg by mouth in the morning.     Fe Fum-Fe Poly-Vit C-Lactobac (FUSION) 65-65-25-30 MG CAPS Take 1 capsule by mouth 3 (three) times a week.     fexofenadine (ALLEGRA) 180 MG tablet Take 180 mg by mouth as needed for allergies or rhinitis.     furosemide (LASIX) 20 MG tablet Take 20 mg by mouth in the morning.     Glucosamine-Chondroit-Vit C-Mn (GLUCOSAMINE CHONDR 500 COMPLEX PO) Take 1 tablet by mouth daily.      ketoconazole (NIZORAL) 2 % cream Apply 1 Application topically 2 (two) times daily as needed (skin rash/irritation.).     levothyroxine (SYNTHROID, LEVOTHROID) 112 MCG tablet Take 112 mcg by mouth daily before breakfast.      losartan (COZAAR) 25 MG tablet Take 25 mg by mouth in the morning.     metoprolol succinate (  TOPROL-XL) 25 MG 24 hr tablet TAKE 1 TABLET (25 MG TOTAL) BY MOUTH DAILY. TAKE WITH OR IMMEDIATELY FOLLOWING A MEAL. 90 tablet 3   nitroGLYCERIN (NITROSTAT) 0.4 MG SL tablet Place 1 tablet (0.4 mg total) under the tongue every 5 (five) minutes as needed for up to 25 days for chest pain. 25 tablet 3   omeprazole (PRILOSEC) 20 MG capsule Take 1 capsule (20 mg total) by mouth 2 (two) times daily before a meal. Twice daily for 1 month.  Then return back to daily dosing. 60 capsule 6   sodium chloride (OCEAN) 0.65 % SOLN nasal spray Place 1 spray into both nostrils as needed for congestion.     No current facility-administered medications for this encounter.     REVIEW OF SYSTEMS: On review of systems, the patient reports that he is doing well. He only has abdominal pain if he has gas. Otherwise he's eating well and is gaining a few pounds back since December. No nausea, vomiting, or urinary dysfunction is noted. No other complaints are verbalized.      PHYSICAL EXAM:  Wt Readings from Last 3 Encounters:  02/18/22 235 lb 12.8 oz (107 kg)   02/18/22 235 lb 8 oz (106.8 kg)  01/31/22 233 lb (105.7 kg)   Temp Readings from Last 3 Encounters:  02/18/22 97.8 F (36.6 C)  02/18/22 (!) 96.8 F (36 C) (Temporal)  01/31/22 97.7 F (36.5 C) (Oral)   BP Readings from Last 3 Encounters:  02/18/22 (!) 134/51  02/18/22 (!) 134/51  01/31/22 (!) 121/55   Pulse Readings from Last 3 Encounters:  02/18/22 66  02/18/22 66  01/31/22 (!) 46   Pain Assessment Pain Score: 0-No pain/10  In general this is a well appearing caucasian male in no acute distress. He's alert and oriented x4 and appropriate throughout the examination. Cardiopulmonary assessment is negative for acute distress and he exhibits normal effort.     ECOG = 1  0 - Asymptomatic (Fully active, able to carry on all predisease activities without restriction)  1 - Symptomatic but completely ambulatory (Restricted in physically strenuous activity but ambulatory and able to carry out work of a light or sedentary nature. For example, light housework, office work)  2 - Symptomatic, <50% in bed during the day (Ambulatory and capable of all self care but unable to carry out any work activities. Up and about more than 50% of waking hours)  3 - Symptomatic, >50% in bed, but not bedbound (Capable of only limited self-care, confined to bed or chair 50% or more of waking hours)  4 - Bedbound (Completely disabled. Cannot carry on any self-care. Totally confined to bed or chair)  5 - Death   Eustace Pen MM, Creech RH, Tormey DC, et al. [phone]). "Toxicity and response criteria of the [facility]". [place] Oncol. 5 (6): 649-55    LABORATORY DATA:  Lab Results  Component Value Date   WBC 5.8 12/25/2021   HGB 10.6 (L) 12/25/2021   HCT 31.6 (L) 12/25/2021   MCV 109.0 (H) 12/25/2021   PLT 158.0 12/25/2021   Lab Results  Component Value Date   NA 140 01/15/2022   K 4.3 01/15/2022   CL 107 01/15/2022   CO2 24 01/15/2022   Lab Results  Component Value  Date   ALT 15 01/30/2022   AST 19 01/30/2022   ALKPHOS 80 01/30/2022   BILITOT 0.6 01/30/2022      RADIOGRAPHY: NM PET Image Initial (PI)  Skull Base To Thigh  Result Date: 02/13/2022 CLINICAL DATA:  Initial treatment strategy for ampullary carcinoma, staging. EXAM: NUCLEAR MEDICINE PET SKULL BASE TO THIGH TECHNIQUE: 11.5 mCi F-18 FDG was injected intravenously. Full-ring PET imaging was performed from the skull base to thigh after the radiotracer. CT data was obtained and used for attenuation correction and anatomic localization. Fasting blood glucose: 109 mg/dl COMPARISON:  12/06/2021 abdominal CT. 02/07/2022 chest CT. Abdominal MRI of 12/16/2021 FINDINGS: Mediastinal blood pool activity: SUV max 3.1 Liver activity: SUV max NA NECK: No areas of abnormal hypermetabolism. Incidental CT findings: Bilateral carotid atherosclerosis. No cervical adenopathy. CHEST: No pulmonary parenchymal or thoracic nodal hypermetabolism. Incidental CT findings: Deferred to recent diagnostic CT. Small right and trace left pleural effusions, similar to the prior CT. Bilateral pleural plaques. Centrilobular emphysema. The right cardiophrenic angle fluid density lesion is not tracer avid at 3.7 x 2.5 cm on 98/4 and favored to represent a pericardial cyst when correlated with contrast-enhanced CT of 12/06/2021. ABDOMEN/PELVIS: Hypermetabolism in the region of the ampulla is likely the site of primary or could be related to the traversing common duct stent. No well-defined dominant mass. No abdominopelvic nodal hypermetabolism. Incidental CT findings: Cirrhosis. Common duct stent. Pneumobilia. Low-density right adrenal nodule of 8 mm is most likely an adenoma but borderline too small to characterize. Bilateral renal sinus cysts. Abdominal aortic atherosclerosis. Small fat containing paraumbilical hernia. SKELETON: No abnormal marrow activity. Incidental CT findings: Mild left-sided gynecomastia. Moderate L1 compression deformity.  IMPRESSION: 1. Isolated hypermetabolism in the region of the ampulla, likely the site of primary. 2. No evidence of tracer avid metastatic disease. 3. Right cardiophrenic angle low-density lesion is most consistent with a pericardial cyst. 4. Similar small, right larger than left pleural effusions. 5. Cirrhosis 6. Incidental findings, including: Aortic atherosclerosis (ICD10-I70.0), coronary artery atherosclerosis and emphysema (ICD10-J43.9). 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 Electronically Signed   By: Abigail Miyamoto M.D.   On: 02/13/2022 15:28   CT CHEST WO CONTRAST  Result Date: 02/07/2022 CLINICAL DATA:  Ampullary carcinoma. EXAM: CT CHEST WITHOUT CONTRAST TECHNIQUE: Multidetector CT imaging of the chest was performed following the standard protocol without IV contrast. RADIATION DOSE REDUCTION: This exam was performed according to the departmental dose-optimization program which includes automated exposure control, adjustment of the mA and/or kV according to patient size and/or use of iterative reconstruction technique. COMPARISON:  CT chest 2008 report and images. More recent imaging of the abdomen from November 2023. FINDINGS: Cardiovascular: On this non IV contrast exam, scattered vascular calcifications are seen along the coronary arteries. The thoracic aorta overall has a normal course and caliber. Pulsation artifact. Calcifications in the area of the aortic valve. Trace pericardial fluid versus thickening. The heart is not enlarged. Mediastinum/Nodes: Normal caliber thoracic esophagus. Atrophic thyroid gland. On this non IV contrast exam there is no specific abnormal lymph node enlargement seen in the axillary regions or hilum. Abnormal nodule or lymph node seen along the anterior cardiophrenic space on the right. This abuts the pericardium. On series 2, image 118 this measures 4.1 by 2.3 cm. Previously this area had measured 1.9 x 1.5 cm. Lungs/Pleura:  Tiny right pleural effusion. Trace left. Mild adjacent opacities. Atelectasis is favored. Apical pleural thickening. Scattered areas of scarring and fibrotic changes. There is also some calcifications along the pleural margins anteriorly on both sides as well as posteriorly. 5 mm nodule seen along the course of the major fissure on image 80 of series 5. The  study in 2008 showed a nodule in this location as well measuring at that time 4 mm. Benign nodule. Upper Abdomen: Nodular liver identified. Please correlate for evidence of chronic liver disease. There is a calcification segment 4 consistent with old granulomatous disease. Air in the biliary tree with what may be a endoscopic stent in the common duct at the edge of the imaging field. The adrenal glands are preserved. Please correlate with patient's history of ampullary carcinoma. There is some subtle soft tissue nodularity posterior to the liver of the edge of the diaphragm on axial image 139. This was seen on the prior examination in retrospect. Attention on follow-up. Musculoskeletal: Diffuse degenerative changes along the spine. There is severe compression of the L1 level with some linear areas of low density. This was seen on this MRI of 11 20 23. IMPRESSION: Interval increase in size of lymph node in the anterior cardiophrenic space on the right. Increase small right pleural effusion compared to the previous MRI. Subtle nodular tissue along the margin of the right hepatic lobe in the upper abdomen posteriorly. Recommend additional workup when appropriate. Calcified pleural plaques. Evidence of chronic liver disease. Aortic Atherosclerosis (ICD10-I70.0). Electronically Signed   By: Jill Side M.D.   On: 02/07/2022 17:33   DG ERCP  Result Date: 01/24/2022 CLINICAL DATA:  Biliary dilatation. EXAM: ERCP TECHNIQUE: Multiple spot images obtained with the fluoroscopic device and submitted for interpretation post-procedure. FLUOROSCOPY: Radiation Exposure  Index (as provided by the fluoroscopic device): 68.79 mGy Kerma COMPARISON:  MRCP 12/16/2021 FINDINGS: Retrograde cholangiogram demonstrates a dilated extrahepatic bile duct. Transition point in the distal common bile duct with severe narrowing near the ampulla. No definite or significant drainage into the duodenum identified. Wire was advanced into the biliary system. No large filling defects or stones identified on these images. Nonmetallic biliary stent was placed. IMPRESSION: Dilatation of the extrahepatic bile duct with severe tapering or narrowing in the distal common bile duct. Placement of biliary stent. These images were submitted for radiologic interpretation only. Please see the procedural report for the amount of contrast. Electronically Signed   By: Markus Daft M.D.   On: 01/24/2022 08:40       IMPRESSION/PLAN: 1. Stage cT2N0M0, Ampullary Adenocarcinoma. Dr. Lisbeth Renshaw discusses the pathology findings and reviews the nature of localized cancer of the ampulla.  We discussed the risks, benefits, short, and long term effects of radiotherapy, as well as the curative intent, and the patient is interested in proceeding. Dr. Lisbeth Renshaw discusses the delivery and logistics of radiotherapy and anticipates a course of 5 1/2 weeks of radiotherapy. He will see Dr. Burr Medico to determine if chemosensitization is an option. We will plan simulation on Friday after he sees her, and anticipate starting treatment on 03/03/22. Written consent is obtained and placed in the chart, a copy was provided to the patient.  2. Biliary obstruction. The patient's stent in the common bile duct has been stented and he will follow up with GI and will see Dr. Rush Landmark in May 2024 for exchange for a metal stent.  In a visit lasting 60 minutes, greater than 50% of the time was spent face to face discussing the patient's condition, in preparation for the discussion, and coordinating the patient's care.   The above documentation reflects my direct  findings during this shared patient visit. Please see the separate note by Dr. Lisbeth Renshaw on this date for the remainder of the patient's plan of care.    Carola Rhine, PAC   **  Disclaimer: This note was dictated with voice recognition software. Similar sounding words can inadvertently be transcribed and this note may contain transcription errors which may not have been corrected upon publication of note.**

## 2022-02-17 NOTE — Telephone Encounter (Signed)
1/22 @ 9:30 am patient return call.  He would like to confirm with his son first of the date/time before schedule consult.  Waiting on call back for confirmation.

## 2022-02-17 NOTE — Telephone Encounter (Signed)
1/22 @ 9:00 am Left voicemail for patient to call our office to be schedule for consult.

## 2022-02-18 ENCOUNTER — Encounter: Payer: Self-pay | Admitting: Radiation Oncology

## 2022-02-18 ENCOUNTER — Other Ambulatory Visit: Payer: Self-pay

## 2022-02-18 ENCOUNTER — Ambulatory Visit
Admission: RE | Admit: 2022-02-18 | Discharge: 2022-02-18 | Disposition: A | Discharge status: Home / Self Care | Payer: PPO | Source: Ambulatory Visit | Attending: Radiation Oncology | Admitting: Radiation Oncology

## 2022-02-18 VITALS — BP 134/51 | HR 66 | Temp 96.8°F | Resp 18 | Ht 68.0 in | Wt 235.5 lb

## 2022-02-18 VITALS — BP 134/51 | HR 66 | Temp 97.8°F | Resp 18 | Ht 68.0 in | Wt 235.8 lb

## 2022-02-18 DIAGNOSIS — C241 Malignant neoplasm of ampulla of Vater: Secondary | ICD-10-CM

## 2022-02-18 DIAGNOSIS — K219 Gastro-esophageal reflux disease without esophagitis: Secondary | ICD-10-CM | POA: Diagnosis not present

## 2022-02-18 DIAGNOSIS — N400 Enlarged prostate without lower urinary tract symptoms: Secondary | ICD-10-CM | POA: Insufficient documentation

## 2022-02-18 DIAGNOSIS — Z7901 Long term (current) use of anticoagulants: Secondary | ICD-10-CM | POA: Insufficient documentation

## 2022-02-18 DIAGNOSIS — Z79899 Other long term (current) drug therapy: Secondary | ICD-10-CM | POA: Insufficient documentation

## 2022-02-18 DIAGNOSIS — K831 Obstruction of bile duct: Secondary | ICD-10-CM | POA: Diagnosis not present

## 2022-02-18 DIAGNOSIS — I6523 Occlusion and stenosis of bilateral carotid arteries: Secondary | ICD-10-CM | POA: Diagnosis not present

## 2022-02-18 DIAGNOSIS — E039 Hypothyroidism, unspecified: Secondary | ICD-10-CM | POA: Insufficient documentation

## 2022-02-18 DIAGNOSIS — I4891 Unspecified atrial fibrillation: Secondary | ICD-10-CM | POA: Insufficient documentation

## 2022-02-18 DIAGNOSIS — Z79890 Hormone replacement therapy (postmenopausal): Secondary | ICD-10-CM | POA: Insufficient documentation

## 2022-02-18 DIAGNOSIS — K7460 Unspecified cirrhosis of liver: Secondary | ICD-10-CM | POA: Diagnosis not present

## 2022-02-18 DIAGNOSIS — K760 Fatty (change of) liver, not elsewhere classified: Secondary | ICD-10-CM | POA: Insufficient documentation

## 2022-02-18 DIAGNOSIS — N62 Hypertrophy of breast: Secondary | ICD-10-CM | POA: Insufficient documentation

## 2022-02-18 DIAGNOSIS — Z7984 Long term (current) use of oral hypoglycemic drugs: Secondary | ICD-10-CM | POA: Diagnosis not present

## 2022-02-18 DIAGNOSIS — Z85828 Personal history of other malignant neoplasm of skin: Secondary | ICD-10-CM | POA: Diagnosis not present

## 2022-02-18 DIAGNOSIS — D509 Iron deficiency anemia, unspecified: Secondary | ICD-10-CM | POA: Insufficient documentation

## 2022-02-18 DIAGNOSIS — J432 Centrilobular emphysema: Secondary | ICD-10-CM | POA: Insufficient documentation

## 2022-02-18 DIAGNOSIS — Z87891 Personal history of nicotine dependence: Secondary | ICD-10-CM | POA: Diagnosis not present

## 2022-02-18 DIAGNOSIS — Z8719 Personal history of other diseases of the digestive system: Secondary | ICD-10-CM | POA: Insufficient documentation

## 2022-02-18 DIAGNOSIS — J90 Pleural effusion, not elsewhere classified: Secondary | ICD-10-CM | POA: Diagnosis not present

## 2022-02-18 DIAGNOSIS — I700 Atherosclerosis of aorta: Secondary | ICD-10-CM | POA: Insufficient documentation

## 2022-02-19 DIAGNOSIS — C241 Malignant neoplasm of ampulla of Vater: Secondary | ICD-10-CM | POA: Diagnosis not present

## 2022-02-20 ENCOUNTER — Other Ambulatory Visit: Payer: Self-pay

## 2022-02-20 NOTE — Progress Notes (Signed)
Carl Woods   Telephone:([phone]) [phone] Fax:(336) [phone]   Clinic Follow up Note   Patient Care Team: Carl Seashore, MD as PCP - General (Internal Medicine) Carl Boston, MD as Consulting Physician (General Surgery) Carl Mayer, MD as Consulting Physician (Gastroenterology) Carl Gala, MD as Consulting Physician (Otolaryngology) Carl Prows, MD as Consulting Physician (Cardiology) Carl Woods as Physician Assistant (Dermatology) Carl Merle, MD as Consulting Physician (Oncology)  Date of Service:  02/21/2022  CHIEF COMPLAINT: f/u of Ampullary Adenocarcinoma     CURRENT THERAPY:  Radiation starting 03/03/2022  ASSESSMENT:  Carl Woods is a 89 y.o. 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 Barry Woods, and felt to be a poor candidate for Whipple surgery. -Patient has decided to proceed with radiation, I discussed the option of concurrent chemotherapy with Xeloda as a radiation sensitizer.  Potential benefit and side effect discussed with patient.  Patient has chronic kidney disease, we will repeat labs today to see if he is a candidate for Xeloda with dose reduction. -Her GFR today is 33, this is borderline for Xeloda.  Given his advanced age, relatively very early stage disease, and the limited benefit of concurrent chemotherapy, I do not recommend the Xeloda or 5-fu.  He will proceed with radiation alone. -I reviewed his molecular testing, MMR was normal, he is not a candidate for immunotherapy.  Foundation One showed no targetable mutations. -I plan to see him back towards the end of radiation in March -He is scheduled for next EGD and ERCP in about 4 months.    PLAN:  -Discuss molecular testing results - Recommend Radiation discuss side effects  -Due to his CKD, I do not recommend concurrent  chemotherapy with Xeloda or 5-FU -Encourage the pt to eat and drink well during Radiation treatment - lab,f/u in March, at the end of radiation.   SUMMARY OF ONCOLOGIC HISTORY: Oncology History  Cancer of ampulla of Vater ([place])  12/06/2021 Imaging   CT ABDOMEN PELVIS W CONTRAST   IMPRESSION: 1. There is moderate intrahepatic bile duct dilatation with marked fusiform dilatation of the common bile duct. No CT visible common bile duct stones identified. Differential considerations include a obstructing distal common bile duct stone (not visible by CT), distal CBD stricture, or mass at the level of the ampullary. Consider further evaluation with contrast enhanced MRI/MRCP or ERCP. 2. Contour the liver appears nodular which may reflect underlying cirrhosis. 3. Age-indeterminate compression fracture involving L1 vertebral body with loss of 50% of the vertebral body height. No signs of retropulsion of fracture fragments into the canal. 4. Fat containing umbilical hernia. 5. 5 mm anterolisthesis of L4 on L5. 6.  Aortic Atherosclerosis (ICD10-I70.0).   12/16/2021 Imaging   MR ABDOMEN MRCP W WO CONTAST   IMPRESSION: 1. Moderate intrahepatic and extrahepatic bile duct dilation with smooth tapering to the level of the ampulla without filling defect or focal mass lesion identified. Findings may reflect ampullary stricture or occult ampullary mass. Consider further evaluation with ERCP. 2. Mild dilated main pancreatic duct at the head measuring up to 8 mm. 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 Recommend follow-up pre and post-contrast MRI/MRCP in 2 years. This recommendation follows ACR consensus guidelines: Management of Incidental Pancreatic Cysts: A White Paper of the  ACR Incidental Findings Committee. J Am Coll Radiol 7124;58:099-833. 3.  Aortic Atherosclerosis (ICD10-I70.0).    01/23/2022 Procedure   DG ERCP   IMPRESSION: Dilatation of the extrahepatic bile duct with severe tapering or narrowing in the distal common bile duct. Placement of biliary stent.   These images were submitted for radiologic interpretation only. Please see the procedural report for the amount of contrast.    01/23/2022 Procedure   EUS/ERCP  ENDOSONOGRAPHIC FINDING: There was dilation in the common bile duct (12.3 mm -> 20.4 mm) and in the common hepatic duct (23.0 mm). A small amount of hyperechoic material consistent with sludge was visualized endosonographically in the common bile duct. Moderate hyperechoic material consistent with sludge was visualized endosonographically in the gallbladder with normal gallbladder wall thickness. Pancreatic parenchymal abnormalities were noted in the entire pancreas. These consisted of hyperechoic foci with shadowing and cysts. There was a 9.1 mm by 6.1 mm cyst in the neck of the pancreas. There was a 15.3 mm by 14.1 mm cyst in the tail of the pancreas. The pancreatic duct had a dilated endosonographic appearance, had a prominently branched endosonographic appearance and had hyperechoic walls in the pancreatic head (PD - 8.2 mm -> 4.3 mm), genu of the pancreas (3.5 mm), body of the pancreas (3.2 mm) and tail of the pancreas (2.8 mm). A hypoechoic irregular lesion was identified endosonographically at the ampulla. The lesion measured 15 mm by 13 mm in maximal cross-sectional diameter. The lesion extended from the mucosa to the submucosa. The outer margins were irregular. This is noted where CBD and PD dilation is noted to occur. Endosonographic imaging in the visualized portion of the liver showed no mass.  No malignant-appearing lymph nodes were visualized in the celiac region (level 20), peripancreatic region and porta hepatis region. The celiac region was visualized.   01/23/2022 Pathology Results   SURGICAL PATHOLOGY  CASE:  [id number]  PATIENT: Carl Woods  Surgical Pathology Report   FINAL MICROSCOPIC DIAGNOSIS:   A. STOMACH, RANDOM, BIOPSY:  - Gastric mucosa, no significant abnormality.  No inflammation,  intestinal metaplasia, dysplasia or malignancy.   B. AMPULLARY LESION, BIOPSY:  - Adenocarcinoma.  See comment.    01/31/2022 Initial Diagnosis   Cancer of ampulla of Vater ([place])   02/07/2022 Imaging    IMPRESSION: Interval increase in size of lymph node in the anterior cardiophrenic space on the right.   Increase small right pleural effusion compared to the previous MRI.   Subtle nodular tissue along the margin of the right hepatic lobe in the upper abdomen posteriorly. Recommend additional workup when appropriate.   Calcified pleural plaques.   Evidence of chronic liver disease.   Aortic Atherosclerosis (ICD10-I70.0).      INTERVAL HISTORY:  Carl Woods is here for a follow up of  Ampullary Adenocarcinoma  He was last seen by me on 02/10/2022 He presents to the clinic accompanied by son. Pt went to see Dr.Byerly and said he was not a candidate for surgery.   All other systems were reviewed with the patient and are negative.  MEDICAL HISTORY:  Past Medical History:  Diagnosis Date   Anal fistula    Arthritis    Fingers and hands   Atrial fibrillation ([place])    AVM (arteriovenous malformation)    Clipped during Colonoscopy 05/2016   Barrett's esophagus    Bilateral cataracts    BPH (benign prostatic hyperplasia)    Cecal angiodysplasia 05/28/2016   ablated at colonoscopy   Deviated nasal  septum    Diverticulosis of sigmoid colon    E. 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   Left Forehead (treatment after biopsy)   OSA on CPAP    Perianal rash    Recurrent epistaxis    Renal cyst 01/04/2013   Small left peripelvic  renal cysts , noted on US Renal   SCCA (squamous cell carcinoma) of skin 10/17/2015   Left Sup Bridge of Nose (curet, cautery and 5FU)   Seasonal allergies    Superficial basal cell carcinoma ([facility]) 10/17/2015   Left Bulb of Nose (curet, cautery and 5FU)   Tubular adenoma     SURGICAL HISTORY: Past Surgical History:  Procedure Laterality Date   BILIARY STENT PLACEMENT N/A 01/23/2022   Procedure: BILIARY STENT PLACEMENT;  Surgeon: Irving Copas., MD;  Location: [facility];  Service: Gastroenterology;  Laterality: N/A;   BIOPSY  01/23/2022   Procedure: BIOPSY;  Surgeon: Rush Landmark Telford Nab., MD;  Location: [facility];  Service: Gastroenterology;;   COLONOSCOPY  multiple   CYSTOSCOPY     ENDOSCOPIC RETROGRADE CHOLANGIOPANCREATOGRAPHY (ERCP) WITH PROPOFOL N/A 01/23/2022   Procedure: ENDOSCOPIC RETROGRADE CHOLANGIOPANCREATOGRAPHY (ERCP) WITH PROPOFOL;  Surgeon: Irving Copas., MD;  Location: [facility];  Service: Gastroenterology;  Laterality: N/A;   ESOPHAGOGASTRODUODENOSCOPY  multiple   ESOPHAGOGASTRODUODENOSCOPY N/A 01/23/2022   Procedure: ESOPHAGOGASTRODUODENOSCOPY (EGD);  Surgeon: Irving Copas., MD;  Location: [facility];  Service: Gastroenterology;  Laterality: N/A;   EUS N/A 01/23/2022   Procedure: UPPER ENDOSCOPIC ULTRASOUND (EUS) RADIAL;  Surgeon: Irving Copas., MD;  Location: [facility];  Service: Gastroenterology;  Laterality: N/A;   EVALUATION UNDER ANESTHESIA WITH FISTULECTOMY N/A 03/02/2018   Procedure: ANORECTAL EXAM UNDER ANESTHESIA WITH REPAIR OF SUPERFICIAL PERIRECTAL FISTULA AND HEMORRHOIDECTOMY;  Surgeon: Carl Boston, MD;  Location: [facility];  Service: General;  Laterality: N/A;   EXPLORATORY LAPAROTOMY     REMOVAL OF STONES  01/23/2022   Procedure: REMOVAL OF STONES;  Surgeon: Irving Copas., MD;  Location: [facility];  Service: Gastroenterology;;   Joan Mayans  01/23/2022   Procedure: Joan Mayans;   Surgeon: Mansouraty, Telford Nab., MD;  Location: [facility];  Service: Gastroenterology;;    I have reviewed the social history and family history with the patient and they are unchanged from previous note.  ALLERGIES:  has No Known Allergies.  MEDICATIONS:  Current Outpatient Medications  Medication Sig Dispense Refill   apixaban (ELIQUIS) 5 MG TABS tablet Take 1 tablet (5 mg total) by mouth 2 (two) times daily. 60 tablet    Cholecalciferol (VITAMIN D3) 5000 units CAPS Take 1 capsule by mouth every evening.     dapagliflozin propanediol (FARXIGA) 10 MG TABS tablet Take 10 mg by mouth in the morning.     Fe Fum-Fe Poly-Vit C-Lactobac (FUSION) 65-65-25-30 MG CAPS Take 1 capsule by mouth 3 (three) times a week.     fexofenadine (ALLEGRA) 180 MG tablet Take 180 mg by mouth as needed for allergies or rhinitis.     furosemide (LASIX) 20 MG tablet Take 20 mg by mouth in the morning.     Glucosamine-Chondroit-Vit C-Mn (GLUCOSAMINE CHONDR 500 COMPLEX PO) Take 1 tablet by mouth daily.      ketoconazole (NIZORAL) 2 % cream Apply 1 Application topically 2 (two) times daily as needed (skin rash/irritation.).  levothyroxine (SYNTHROID, LEVOTHROID) 112 MCG tablet Take 112 mcg by mouth daily before breakfast.      losartan (COZAAR) 25 MG tablet Take 25 mg by mouth in the morning.     metoprolol succinate (TOPROL-XL) 25 MG 24 hr tablet TAKE 1 TABLET (25 MG TOTAL) BY MOUTH DAILY. TAKE WITH OR IMMEDIATELY FOLLOWING A MEAL. 90 tablet 3   nitroGLYCERIN (NITROSTAT) 0.4 MG SL tablet Place 1 tablet (0.4 mg total) under the tongue every 5 (five) minutes as needed for up to 25 days for chest pain. 25 tablet 3   omeprazole (PRILOSEC) 20 MG capsule Take 1 capsule (20 mg total) by mouth 2 (two) times daily before a meal. Twice daily for 1 month.  Then return back to daily dosing. 60 capsule 6   sodium chloride (OCEAN) 0.65 % SOLN nasal spray Place 1 spray into both nostrils as needed for congestion.     No current  facility-administered medications for this visit.    PHYSICAL EXAMINATION: ECOG PERFORMANCE STATUS: 0 - Asymptomatic  Vitals:   02/21/22 0850  BP: 118/60  Pulse: 74  Resp: 16  Temp: 98.3 F (36.8 C)  SpO2: 99%   Wt Readings from Last 3 Encounters:  02/21/22 235 lb 6.4 oz (106.8 kg)  02/18/22 235 lb 12.8 oz (107 kg)  02/18/22 235 lb 8 oz (106.8 kg)     GENERAL:alert, no distress and comfortable SKIN: skin color, texture, turgor are normal, no rashes or significant lesions EYES: normal, Conjunctiva are pink and non-injected, sclera clear NECK: supple, thyroid normal size, non-tender, without nodularity LYMPH:  no palpable lymphadenopathy in the cervical, axillary  LUNGS: clear to auscultation and percussion with normal breathing effort HEART: regular rate & rhythm and no murmurs and no lower extremity edema ABDOMEN:abdomen soft, non-tender and normal bowel sounds Musculoskeletal:no cyanosis of digits and no clubbing  NEURO: alert & oriented x 3 with fluent speech, no focal motor/sensory deficits  LABORATORY DATA:  I have reviewed the data as listed    Latest Ref Rng & Units 02/21/2022    8:11 AM 12/25/2021    3:07 PM 12/10/2021    9:37 AM  CBC  WBC 4.0 - 10.5 K/uL 7.0  5.8  6.1   Hemoglobin 13.0 - 17.0 g/dL 10.4  10.6  10.4   Hematocrit 39.0 - 52.0 % 31.2  31.6  31.0   Platelets 150 - 400 K/uL 158  158.0  135.0         Latest Ref Rng & Units 02/21/2022    8:11 AM 02/07/2022    1:10 PM 01/30/2022    1:28 PM  CMP  Glucose 70 - 99 mg/dL 98     BUN 8 - 23 mg/dL 25     Creatinine 0.61 - 1.24 mg/dL 1.94  1.90    Sodium 135 - 145 mmol/L 140     Potassium 3.5 - 5.1 mmol/L 4.4     Chloride 98 - 111 mmol/L 108     CO2 22 - 32 mmol/L 25     Calcium 8.9 - 10.3 mg/dL 9.3     Total Protein 6.5 - 8.1 g/dL 7.0   6.7   Total Bilirubin 0.3 - 1.2 mg/dL 0.5   0.6   Alkaline Phos 38 - 126 U/L 57   80   AST 15 - 41 U/L 15   19   ALT 0 - 44 U/L 10   15       RADIOGRAPHIC  STUDIES: I have  personally reviewed the radiological images as listed and agreed with the findings in the report. No results found.    No orders of the defined types were placed in this encounter.  All questions were answered. The patient knows to call the clinic with any problems, questions or concerns. No barriers to learning was detected. The total time spent in the appointment was 30 minutes.     Carl Merle, MD 02/21/2022   Felicity Coyer, CMA, am acting as scribe for Carl Merle, MD.   I have reviewed the above documentation for accuracy and completeness, and I agree with the above.

## 2022-02-21 ENCOUNTER — Inpatient Hospital Stay: Payer: PPO

## 2022-02-21 ENCOUNTER — Ambulatory Visit (INDEPENDENT_AMBULATORY_CARE_PROVIDER_SITE_OTHER): Payer: PPO

## 2022-02-21 ENCOUNTER — Ambulatory Visit
Admission: RE | Admit: 2022-02-21 | Discharge: 2022-02-21 | Disposition: A | Discharge status: Home / Self Care | Payer: PPO | Source: Ambulatory Visit | Attending: Radiation Oncology | Admitting: Radiation Oncology

## 2022-02-21 ENCOUNTER — Inpatient Hospital Stay (HOSPITAL_BASED_OUTPATIENT_CLINIC_OR_DEPARTMENT_OTHER): Payer: PPO | Admitting: Hematology

## 2022-02-21 ENCOUNTER — Ambulatory Visit
Admission: EM | Admit: 2022-02-21 | Discharge: 2022-02-21 | Disposition: A | Discharge status: Home / Self Care | Payer: PPO | Attending: Internal Medicine | Admitting: Internal Medicine

## 2022-02-21 ENCOUNTER — Encounter: Payer: Self-pay | Admitting: Hematology

## 2022-02-21 ENCOUNTER — Other Ambulatory Visit: Payer: Self-pay

## 2022-02-21 VITALS — BP 118/60 | HR 74 | Temp 98.3°F | Resp 16 | Wt 235.4 lb

## 2022-02-21 DIAGNOSIS — M25561 Pain in right knee: Secondary | ICD-10-CM

## 2022-02-21 DIAGNOSIS — C241 Malignant neoplasm of ampulla of Vater: Secondary | ICD-10-CM | POA: Diagnosis not present

## 2022-02-21 DIAGNOSIS — M25461 Effusion, right knee: Secondary | ICD-10-CM | POA: Diagnosis not present

## 2022-02-21 LAB — CBC WITH DIFFERENTIAL (CANCER CENTER ONLY)
Abs Immature Granulocytes: 0.03 10*3/uL (ref 0.00–0.07)
Basophils Absolute: 0.1 10*3/uL (ref 0.0–0.1)
Basophils Relative: 1 %
Eosinophils Absolute: 0.2 10*3/uL (ref 0.0–0.5)
Eosinophils Relative: 4 %
HCT: 31.2 % — ABNORMAL LOW (ref 39.0–52.0)
Hemoglobin: 10.4 g/dL — ABNORMAL LOW (ref 13.0–17.0)
Immature Granulocytes: 0 %
Lymphocytes Relative: 26 %
Lymphs Abs: 1.8 10*3/uL (ref 0.7–4.0)
MCH: 35 pg — ABNORMAL HIGH (ref 26.0–34.0)
MCHC: 33.3 g/dL (ref 30.0–36.0)
MCV: 105.1 fL — ABNORMAL HIGH (ref 80.0–100.0)
Monocytes Absolute: 0.8 10*3/uL (ref 0.1–1.0)
Monocytes Relative: 11 %
Neutro Abs: 4.1 10*3/uL (ref 1.7–7.7)
Neutrophils Relative %: 58 %
Platelet Count: 158 10*3/uL (ref 150–400)
RBC: 2.97 MIL/uL — ABNORMAL LOW (ref 4.22–5.81)
RDW: 15.9 % — ABNORMAL HIGH (ref 11.5–15.5)
WBC Count: 7 10*3/uL (ref 4.0–10.5)
nRBC: 0 % (ref 0.0–0.2)

## 2022-02-21 LAB — CMP (CANCER CENTER ONLY)
ALT: 10 U/L (ref 0–44)
AST: 15 U/L (ref 15–41)
Albumin: 3.4 g/dL — ABNORMAL LOW (ref 3.5–5.0)
Alkaline Phosphatase: 57 U/L (ref 38–126)
Anion gap: 7 (ref 5–15)
BUN: 25 mg/dL — ABNORMAL HIGH (ref 8–23)
CO2: 25 mmol/L (ref 22–32)
Calcium: 9.3 mg/dL (ref 8.9–10.3)
Chloride: 108 mmol/L (ref 98–111)
Creatinine: 1.94 mg/dL — ABNORMAL HIGH (ref 0.61–1.24)
GFR, Estimated: 33 mL/min — ABNORMAL LOW (ref >60)
Glucose, Bld: 98 mg/dL (ref 70–99)
Potassium: 4.4 mmol/L (ref 3.5–5.1)
Sodium: 140 mmol/L (ref 135–145)
Total Bilirubin: 0.5 mg/dL (ref 0.3–1.2)
Total Protein: 7 g/dL (ref 6.5–8.1)

## 2022-02-21 LAB — FERRITIN: Ferritin: 34 ng/mL (ref 24–336)

## 2022-02-21 LAB — VITAMIN B12: Vitamin B-12: 266 pg/mL (ref 180–914)

## 2022-02-21 LAB — FOLATE: Folate: 11.3 ng/mL (ref >5.9)

## 2022-02-21 LAB — IRON AND IRON BINDING CAPACITY (CC-WL,HP ONLY)
Iron: 64 ug/dL (ref 45–182)
Saturation Ratios: 17 % — ABNORMAL LOW (ref 17.9–39.5)
TIBC: 370 ug/dL (ref 250–450)
UIBC: 306 ug/dL (ref 117–376)

## 2022-02-21 NOTE — ED Provider Notes (Addendum)
[facility]    CSN: [id number] Arrival date & time: 02/21/22  1151      History   Chief Complaint Chief Complaint  Patient presents with   Knee Pain    HPI Carl Woods is a 89 y.o. male.   Patient presents with right knee pain that has been present for about 5 days.  He denies any obvious injury or history of chronic knee pain.  Patient reports he took Tylenol this morning with mild improvement in pain.  Denies any numbness or tingling.  He is able to bear weight.   Knee Pain   Past Medical History:  Diagnosis Date   Anal fistula    Arthritis    Fingers and hands   Atrial fibrillation ([place])    AVM (arteriovenous malformation)    Clipped during Colonoscopy 05/2016   Barrett's esophagus    Bilateral cataracts    BPH (benign prostatic hyperplasia)    Cecal angiodysplasia 05/28/2016   ablated at colonoscopy   Deviated nasal septum    Diverticulosis of sigmoid colon    E. 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   Left Forehead (treatment after biopsy)   OSA on CPAP    Perianal rash    Recurrent epistaxis    Renal cyst 01/04/2013   Small left peripelvic renal cysts , noted on US Renal   SCCA (squamous cell carcinoma) of skin 10/17/2015   Left Sup Bridge of Nose (curet, cautery and 5FU)   Seasonal allergies    Superficial basal cell carcinoma (BCC) 10/17/2015   Left Bulb of Nose (curet, cautery and 5FU)   Tubular adenoma     Patient Active Problem List   Diagnosis Date Noted   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   Perianal rash 08/06/2017   Atrial fibrillation ([place]) 07/08/2016   Recurrent epistaxis 07/08/2016   Deviated nasal septum 07/08/2016   Cecal angiodysplasia 05/28/2016   Iron deficiency anemia due to chronic blood loss 04/23/2016   Heme positive  stool 04/23/2016   Chronic anticoagulation 04/23/2016   BARRETTS ESOPHAGUS 04/23/2009   History of colonic polyps 04/23/2009    Past Surgical History:  Procedure Laterality Date   BILIARY STENT PLACEMENT N/A 01/23/2022   Procedure: BILIARY STENT PLACEMENT;  Surgeon: Irving Copas., MD;  Location: [facility];  Service: Gastroenterology;  Laterality: N/A;   BIOPSY  01/23/2022   Procedure: BIOPSY;  Surgeon: Rush Landmark Telford Nab., MD;  Location: [facility];  Service: Gastroenterology;;   COLONOSCOPY  multiple   CYSTOSCOPY     ENDOSCOPIC RETROGRADE CHOLANGIOPANCREATOGRAPHY (ERCP) WITH PROPOFOL N/A 01/23/2022   Procedure: ENDOSCOPIC RETROGRADE CHOLANGIOPANCREATOGRAPHY (ERCP) WITH PROPOFOL;  Surgeon: Irving Copas., MD;  Location: [facility];  Service: Gastroenterology;  Laterality: N/A;   ESOPHAGOGASTRODUODENOSCOPY  multiple   ESOPHAGOGASTRODUODENOSCOPY N/A 01/23/2022   Procedure: ESOPHAGOGASTRODUODENOSCOPY (EGD);  Surgeon: Irving Copas., MD;  Location: [facility];  Service: Gastroenterology;  Laterality: N/A;   EUS N/A 01/23/2022   Procedure: UPPER ENDOSCOPIC ULTRASOUND (EUS) RADIAL;  Surgeon: Irving Copas., MD;  Location: [facility];  Service: Gastroenterology;  Laterality: N/A;   EVALUATION UNDER ANESTHESIA WITH FISTULECTOMY N/A 03/02/2018   Procedure: ANORECTAL EXAM UNDER ANESTHESIA WITH REPAIR OF SUPERFICIAL PERIRECTAL FISTULA AND HEMORRHOIDECTOMY;  Surgeon: Michael Boston, MD;  Location: [facility];  Service: General;  Laterality: N/A;   EXPLORATORY LAPAROTOMY     REMOVAL OF STONES  01/23/2022   Procedure: REMOVAL OF STONES;  Surgeon: Rush Landmark Telford Nab., MD;  Location: [facility];  Service: Gastroenterology;;   Joan Mayans  01/23/2022   Procedure: Joan Mayans;  Surgeon: Rush Landmark Telford Nab., MD;  Location: [facility];  Service: Gastroenterology;;       Home Medications    Prior to Admission medications   Medication Sig  Start Date End Date Taking? Authorizing Provider  apixaban (ELIQUIS) 5 MG TABS tablet Take 1 tablet (5 mg total) by mouth 2 (two) times daily. 01/25/22   Mansouraty, Telford Nab., MD  Cholecalciferol (VITAMIN D3) 5000 units CAPS Take 1 capsule by mouth every evening.    [provider]  dapagliflozin propanediol (FARXIGA) 10 MG TABS tablet Take 10 mg by mouth in the morning. 05/29/20   [provider]  Fe Fum-Fe Poly-Vit C-Lactobac (FUSION) 65-65-25-30 MG CAPS Take 1 capsule by mouth 3 (three) times a week.    [provider]  fexofenadine (ALLEGRA) 180 MG tablet Take 180 mg by mouth as needed for allergies or rhinitis.    [provider]  furosemide (LASIX) 20 MG tablet Take 20 mg by mouth in the morning. 02/04/16   [provider]  Glucosamine-Chondroit-Vit C-Mn (GLUCOSAMINE CHONDR 500 COMPLEX PO) Take 1 tablet by mouth daily.     [provider]  ketoconazole (NIZORAL) 2 % cream Apply 1 Application topically 2 (two) times daily as needed (skin rash/irritation.).    [provider]  levothyroxine (SYNTHROID, LEVOTHROID) 112 MCG tablet Take 112 mcg by mouth daily before breakfast.  02/11/16   [provider]  losartan (COZAAR) 25 MG tablet Take 25 mg by mouth in the morning. 02/05/16   [provider]  metoprolol succinate (TOPROL-XL) 25 MG 24 hr tablet TAKE 1 TABLET (25 MG TOTAL) BY MOUTH DAILY. TAKE WITH OR IMMEDIATELY FOLLOWING A MEAL. 05/06/21 05/01/22  Adrian Prows, MD  nitroGLYCERIN (NITROSTAT) 0.4 MG SL tablet Place 1 tablet (0.4 mg total) under the tongue every 5 (five) minutes as needed for up to 25 days for chest pain. 01/31/20 01/18/24  Adrian Prows, MD  omeprazole (PRILOSEC) 20 MG capsule Take 1 capsule (20 mg total) by mouth 2 (two) times daily before a meal. Twice daily for 1 month.  Then return back to daily dosing. 01/23/22   Mansouraty, Telford Nab., MD  sodium chloride (OCEAN) 0.65 % SOLN nasal spray Place 1 spray into  both nostrils as needed for congestion.    [provider]    Family History Family History  Problem Relation Age of Onset   Heart disease Mother    Other Father        old age   Ovarian cancer Sister    Colon cancer Neg Hx    Stomach cancer Neg Hx    Rectal cancer Neg Hx    Esophageal cancer Neg Hx    Liver cancer Neg Hx     Social History Social History   Tobacco Use   Smoking status: Former    Packs/day: 0.50    Years: 40.00    Total pack years: 20.00    Types: Cigarettes    Quit date: 2006    Years since quitting: 18.0   Smokeless tobacco: Never  Vaping Use   Vaping Use: Never used  Substance Use Topics   Alcohol use: Yes    Comment: occasional wine  Drug use: No     Allergies   Patient has no known allergies.   Review of Systems Review of Systems Per HPI  Physical Exam Triage Vital Signs ED Triage Vitals  Enc Vitals Group     BP 02/21/22 1343 131/73     Pulse Rate 02/21/22 1343 69     Resp 02/21/22 1343 20     Temp 02/21/22 1343 97.6 F (36.4 C)     Temp Source 02/21/22 1343 Oral     SpO2 02/21/22 1343 98 %     Weight --      Height --      Head Circumference --      Peak Flow --      Pain Score 02/21/22 1340 5     Pain Loc --      Pain Edu? --      Excl. in [place]? --    No data found.  Updated Vital Signs BP 131/73 (BP Location: Left Arm)   Pulse 69   Temp 97.6 F (36.4 C) (Oral)   Resp 20   SpO2 98%   Visual Acuity Right Eye Distance:   Left Eye Distance:   Bilateral Distance:    Right Eye Near:   Left Eye Near:    Bilateral Near:     Physical Exam Constitutional:      General: He is not in acute distress.    Appearance: Normal appearance. He is not toxic-appearing or diaphoretic.  HENT:     Head: Normocephalic and atraumatic.  Eyes:     Extraocular Movements: Extraocular movements intact.     Conjunctiva/sclera: Conjunctivae normal.  Pulmonary:     Effort: Pulmonary effort is normal.  Musculoskeletal:      Comments: Tenderness to palpation throughout anterior knee with associated mild swelling.  Patient has area of erythema present to mid anterior knee that is approximately 2 to 3 cm in diameter.  No abrasions or lacerations noted.  Patient has swelling and erythema with scaly skin present throughout bilateral lower extremities and patient reports this is baseline.  Neurovascular intact.  Neurological:     General: No focal deficit present.     Mental Status: He is alert and oriented to person, place, and time. Mental status is at baseline.  Psychiatric:        Mood and Affect: Mood normal.        Behavior: Behavior normal.        Thought Content: Thought content normal.        Judgment: Judgment normal.      UC Treatments / Results  Labs (all labs ordered are listed, but only abnormal results are displayed) Labs Reviewed - No data to display  EKG   Radiology DG Knee Complete 4 Views Right  Result Date: 02/21/2022 CLINICAL DATA:  Knee pain for 5 days. EXAM: RIGHT KNEE - COMPLETE 4+ VIEW COMPARISON:  None Available. FINDINGS: Mild to moderate medial coronoid joint space narrowing and peripheral osteophytosis. The lateral acromion joint space is maintained. Mild patellofemoral joint space narrowing and peripheral osteophytosis. Tiny joint effusion. No acute fracture or dislocation. Mild-to-moderate vascular calcifications. IMPRESSION: Mild-to-moderate medial compartment and mild patellofemoral compartment osteoarthritis. Electronically Signed   By: Yvonne Kendall M.D.   On: 02/21/2022 14:30    Procedures Procedures (including critical care time)  Medications Ordered in UC Medications - No data to display  Initial Impression / Assessment and Plan / UC Course  I have reviewed the triage vital signs  and the nursing notes.  Pertinent labs & imaging results that were available during my care of the patient were reviewed by me and considered in my medical decision making (see chart for  details).     Knee x-ray showed osteoarthritis and possible tiny knee joint effusion.  Limited options on pain management given patient's chronic health conditions.  Unable to do NSAIDs given the patient takes Eliquis and has elevated creatinine.  Unable to do prednisone given history of glaucoma and current treatment for cancer.  Tylenol has not been very beneficial and this needs to be limited given history of elevated LFTs.  Given limited options on pain management and associated physical exam, I do think that patient seeing an orthopedist today would be beneficial.  They may be able to provide interventions that could be helpful.  Therefore, patient was advised to go to [facility] walk-in clinic today for further evaluation and management and he was agreeable with this.  Provided patient with address and phone number for walk-in clinic.  There is no concern for bacterial infection or cellulitis.  Patient does have bilateral lower extremity swelling but he reports this is baseline and unchanged since right knee pain started.  Patient left via self transport. Final Clinical Impressions(s) / UC Diagnoses   Final diagnoses:  Acute pain of right knee     Discharge Instructions      Your x-ray is showing arthritis.  I recommend that you see an orthopedist today.  Please go to [facility] walk-in clinic for further evaluation and management.     ED Prescriptions   None    PDMP not reviewed this encounter.   Teodora Medici, [place] 02/21/22 [place], [place], [place] 02/21/22 [phone]

## 2022-02-21 NOTE — Discharge Instructions (Signed)
Your x-ray is showing arthritis.  I recommend that you see an orthopedist today.  Please go to [facility] walk-in clinic for further evaluation and management.

## 2022-02-21 NOTE — Assessment & Plan Note (Signed)
-  diagnosed in 12/2021 -I reviewed PET scan findings, which showed no evidence of distant metastasis  -He was evaluated by pancreatobiliary surgeon Dr. Barry Dienes, and felt to be a poor candidate for Whipple surgery. -Patient has decided to proceed with radiation, I discussed the option of concurrent chemotherapy with Xeloda as a radiation sensitizer.  Potential benefit and side effect discussed with patient.  Patient has chronic kidney disease, we will repeat labs today to see if he is a candidate for Xeloda with dose reduction.

## 2022-02-21 NOTE — ED Triage Notes (Signed)
Patient presents to [name] for right knee pain since Sunday. Took a dose of tylenol this morning.  Pain with movement.   Denies injury.

## 2022-02-28 ENCOUNTER — Ambulatory Visit
Admission: RE | Admit: 2022-02-28 | Discharge: 2022-02-28 | Disposition: A | Discharge status: Home / Self Care | Payer: PPO | Source: Ambulatory Visit | Attending: Radiation Oncology | Admitting: Radiation Oncology

## 2022-02-28 DIAGNOSIS — Z510 Encounter for antineoplastic radiation therapy: Secondary | ICD-10-CM | POA: Insufficient documentation

## 2022-02-28 DIAGNOSIS — C241 Malignant neoplasm of ampulla of Vater: Secondary | ICD-10-CM | POA: Diagnosis not present

## 2022-03-03 ENCOUNTER — Other Ambulatory Visit: Payer: Self-pay

## 2022-03-03 DIAGNOSIS — C241 Malignant neoplasm of ampulla of Vater: Secondary | ICD-10-CM | POA: Diagnosis not present

## 2022-03-03 DIAGNOSIS — Z510 Encounter for antineoplastic radiation therapy: Secondary | ICD-10-CM | POA: Diagnosis not present

## 2022-03-03 LAB — RAD ONC ARIA SESSION SUMMARY
Course Elapsed Days: 0
Plan Fractions Treated to Date: 1
Plan Prescribed Dose Per Fraction: 1.8 Gy
Plan Total Fractions Prescribed: 25
Plan Total Prescribed Dose: 45 Gy
Reference Point Dosage Given to Date: 1.8 Gy
Reference Point Session Dosage Given: 1.8 Gy
Session Number: 1

## 2022-03-04 ENCOUNTER — Other Ambulatory Visit: Payer: Self-pay

## 2022-03-04 ENCOUNTER — Ambulatory Visit
Admission: RE | Admit: 2022-03-04 | Discharge: 2022-03-04 | Disposition: A | Discharge status: Home / Self Care | Payer: PPO | Source: Ambulatory Visit | Attending: Radiation Oncology | Admitting: Radiation Oncology

## 2022-03-04 DIAGNOSIS — Z510 Encounter for antineoplastic radiation therapy: Secondary | ICD-10-CM | POA: Diagnosis not present

## 2022-03-04 DIAGNOSIS — C241 Malignant neoplasm of ampulla of Vater: Secondary | ICD-10-CM | POA: Diagnosis not present

## 2022-03-04 LAB — RAD ONC ARIA SESSION SUMMARY
Course Elapsed Days: 1
Plan Fractions Treated to Date: 2
Plan Prescribed Dose Per Fraction: 1.8 Gy
Plan Total Fractions Prescribed: 25
Plan Total Prescribed Dose: 45 Gy
Reference Point Dosage Given to Date: 3.6 Gy
Reference Point Session Dosage Given: 1.8 Gy
Session Number: 2

## 2022-03-05 ENCOUNTER — Other Ambulatory Visit: Payer: Self-pay

## 2022-03-05 ENCOUNTER — Ambulatory Visit
Admission: RE | Admit: 2022-03-05 | Discharge: 2022-03-05 | Disposition: A | Discharge status: Home / Self Care | Payer: PPO | Source: Ambulatory Visit | Attending: Radiation Oncology | Admitting: Radiation Oncology

## 2022-03-05 DIAGNOSIS — C241 Malignant neoplasm of ampulla of Vater: Secondary | ICD-10-CM | POA: Diagnosis not present

## 2022-03-05 DIAGNOSIS — Z510 Encounter for antineoplastic radiation therapy: Secondary | ICD-10-CM | POA: Diagnosis not present

## 2022-03-05 LAB — RAD ONC ARIA SESSION SUMMARY
Course Elapsed Days: 2
Plan Fractions Treated to Date: 3
Plan Prescribed Dose Per Fraction: 1.8 Gy
Plan Total Fractions Prescribed: 25
Plan Total Prescribed Dose: 45 Gy
Reference Point Dosage Given to Date: 5.4 Gy
Reference Point Session Dosage Given: 1.8 Gy
Session Number: 3

## 2022-03-06 ENCOUNTER — Other Ambulatory Visit: Payer: Self-pay

## 2022-03-06 ENCOUNTER — Ambulatory Visit
Admission: RE | Admit: 2022-03-06 | Discharge: 2022-03-06 | Disposition: A | Discharge status: Home / Self Care | Payer: PPO | Source: Ambulatory Visit | Attending: Radiation Oncology | Admitting: Radiation Oncology

## 2022-03-06 DIAGNOSIS — C241 Malignant neoplasm of ampulla of Vater: Secondary | ICD-10-CM | POA: Diagnosis not present

## 2022-03-06 DIAGNOSIS — Z510 Encounter for antineoplastic radiation therapy: Secondary | ICD-10-CM | POA: Diagnosis not present

## 2022-03-06 LAB — RAD ONC ARIA SESSION SUMMARY
Course Elapsed Days: 3
Plan Fractions Treated to Date: 4
Plan Prescribed Dose Per Fraction: 1.8 Gy
Plan Total Fractions Prescribed: 25
Plan Total Prescribed Dose: 45 Gy
Reference Point Dosage Given to Date: 7.2 Gy
Reference Point Session Dosage Given: 1.8 Gy
Session Number: 4

## 2022-03-07 ENCOUNTER — Ambulatory Visit
Admission: RE | Admit: 2022-03-07 | Discharge: 2022-03-07 | Disposition: A | Discharge status: Home / Self Care | Payer: PPO | Source: Ambulatory Visit | Attending: Radiation Oncology | Admitting: Radiation Oncology

## 2022-03-07 ENCOUNTER — Other Ambulatory Visit: Payer: Self-pay

## 2022-03-07 DIAGNOSIS — Z510 Encounter for antineoplastic radiation therapy: Secondary | ICD-10-CM | POA: Diagnosis not present

## 2022-03-07 DIAGNOSIS — C241 Malignant neoplasm of ampulla of Vater: Secondary | ICD-10-CM

## 2022-03-07 LAB — RAD ONC ARIA SESSION SUMMARY
Course Elapsed Days: 4
Plan Fractions Treated to Date: 5
Plan Prescribed Dose Per Fraction: 1.8 Gy
Plan Total Fractions Prescribed: 25
Plan Total Prescribed Dose: 45 Gy
Reference Point Dosage Given to Date: 9 Gy
Reference Point Session Dosage Given: 1.8 Gy
Session Number: 5

## 2022-03-07 MED ORDER — SONAFINE EX EMUL
1.0000 | Freq: Once | CUTANEOUS | Status: AC
  Administered 2022-03-07: 1 via TOPICAL

## 2022-03-10 ENCOUNTER — Ambulatory Visit
Admission: RE | Admit: 2022-03-10 | Discharge: 2022-03-10 | Disposition: A | Discharge status: Home / Self Care | Payer: PPO | Source: Ambulatory Visit | Attending: Radiation Oncology | Admitting: Radiation Oncology

## 2022-03-10 ENCOUNTER — Other Ambulatory Visit: Payer: Self-pay

## 2022-03-10 DIAGNOSIS — Z510 Encounter for antineoplastic radiation therapy: Secondary | ICD-10-CM | POA: Diagnosis not present

## 2022-03-10 DIAGNOSIS — C241 Malignant neoplasm of ampulla of Vater: Secondary | ICD-10-CM | POA: Diagnosis not present

## 2022-03-10 LAB — RAD ONC ARIA SESSION SUMMARY
Course Elapsed Days: 7
Plan Fractions Treated to Date: 6
Plan Prescribed Dose Per Fraction: 1.8 Gy
Plan Total Fractions Prescribed: 25
Plan Total Prescribed Dose: 45 Gy
Reference Point Dosage Given to Date: 10.8 Gy
Reference Point Session Dosage Given: 1.8 Gy
Session Number: 6

## 2022-03-11 ENCOUNTER — Other Ambulatory Visit: Payer: Self-pay

## 2022-03-11 ENCOUNTER — Ambulatory Visit
Admission: RE | Admit: 2022-03-11 | Discharge: 2022-03-11 | Disposition: A | Discharge status: Home / Self Care | Payer: PPO | Source: Ambulatory Visit | Attending: Radiation Oncology | Admitting: Radiation Oncology

## 2022-03-11 DIAGNOSIS — Z510 Encounter for antineoplastic radiation therapy: Secondary | ICD-10-CM | POA: Diagnosis not present

## 2022-03-11 DIAGNOSIS — C241 Malignant neoplasm of ampulla of Vater: Secondary | ICD-10-CM | POA: Diagnosis not present

## 2022-03-11 LAB — RAD ONC ARIA SESSION SUMMARY
Course Elapsed Days: 8
Plan Fractions Treated to Date: 7
Plan Prescribed Dose Per Fraction: 1.8 Gy
Plan Total Fractions Prescribed: 25
Plan Total Prescribed Dose: 45 Gy
Reference Point Dosage Given to Date: 12.6 Gy
Reference Point Session Dosage Given: 1.8 Gy
Session Number: 7

## 2022-03-12 ENCOUNTER — Other Ambulatory Visit: Payer: Self-pay

## 2022-03-12 ENCOUNTER — Ambulatory Visit
Admission: RE | Admit: 2022-03-12 | Discharge: 2022-03-12 | Disposition: A | Discharge status: Home / Self Care | Payer: PPO | Source: Ambulatory Visit | Attending: Radiation Oncology | Admitting: Radiation Oncology

## 2022-03-12 DIAGNOSIS — C241 Malignant neoplasm of ampulla of Vater: Secondary | ICD-10-CM | POA: Diagnosis not present

## 2022-03-12 DIAGNOSIS — Z510 Encounter for antineoplastic radiation therapy: Secondary | ICD-10-CM | POA: Diagnosis not present

## 2022-03-12 LAB — RAD ONC ARIA SESSION SUMMARY
Course Elapsed Days: 9
Plan Fractions Treated to Date: 8
Plan Prescribed Dose Per Fraction: 1.8 Gy
Plan Total Fractions Prescribed: 25
Plan Total Prescribed Dose: 45 Gy
Reference Point Dosage Given to Date: 14.4 Gy
Reference Point Session Dosage Given: 1.8 Gy
Session Number: 8

## 2022-03-13 ENCOUNTER — Ambulatory Visit
Admission: RE | Admit: 2022-03-13 | Discharge: 2022-03-13 | Disposition: A | Discharge status: Home / Self Care | Payer: PPO | Source: Ambulatory Visit | Attending: Radiation Oncology | Admitting: Radiation Oncology

## 2022-03-13 ENCOUNTER — Other Ambulatory Visit: Payer: Self-pay

## 2022-03-13 DIAGNOSIS — C241 Malignant neoplasm of ampulla of Vater: Secondary | ICD-10-CM | POA: Diagnosis not present

## 2022-03-13 DIAGNOSIS — Z510 Encounter for antineoplastic radiation therapy: Secondary | ICD-10-CM | POA: Diagnosis not present

## 2022-03-13 LAB — RAD ONC ARIA SESSION SUMMARY
Course Elapsed Days: 10
Plan Fractions Treated to Date: 9
Plan Prescribed Dose Per Fraction: 1.8 Gy
Plan Total Fractions Prescribed: 25
Plan Total Prescribed Dose: 45 Gy
Reference Point Dosage Given to Date: 16.2 Gy
Reference Point Session Dosage Given: 1.8 Gy
Session Number: 9

## 2022-03-14 ENCOUNTER — Other Ambulatory Visit: Payer: Self-pay

## 2022-03-14 ENCOUNTER — Ambulatory Visit
Admission: RE | Admit: 2022-03-14 | Discharge: 2022-03-14 | Disposition: A | Discharge status: Home / Self Care | Payer: PPO | Source: Ambulatory Visit | Attending: Radiation Oncology | Admitting: Radiation Oncology

## 2022-03-14 DIAGNOSIS — Z510 Encounter for antineoplastic radiation therapy: Secondary | ICD-10-CM | POA: Diagnosis not present

## 2022-03-14 DIAGNOSIS — C241 Malignant neoplasm of ampulla of Vater: Secondary | ICD-10-CM | POA: Diagnosis not present

## 2022-03-14 LAB — RAD ONC ARIA SESSION SUMMARY
Course Elapsed Days: 11
Plan Fractions Treated to Date: 10
Plan Prescribed Dose Per Fraction: 1.8 Gy
Plan Total Fractions Prescribed: 25
Plan Total Prescribed Dose: 45 Gy
Reference Point Dosage Given to Date: 18 Gy
Reference Point Session Dosage Given: 1.8 Gy
Session Number: 10

## 2022-03-17 ENCOUNTER — Ambulatory Visit
Admission: RE | Admit: 2022-03-17 | Discharge: 2022-03-17 | Disposition: A | Discharge status: Home / Self Care | Payer: PPO | Source: Ambulatory Visit | Attending: Radiation Oncology | Admitting: Radiation Oncology

## 2022-03-17 ENCOUNTER — Other Ambulatory Visit: Payer: Self-pay

## 2022-03-17 DIAGNOSIS — C241 Malignant neoplasm of ampulla of Vater: Secondary | ICD-10-CM | POA: Diagnosis not present

## 2022-03-17 DIAGNOSIS — Z510 Encounter for antineoplastic radiation therapy: Secondary | ICD-10-CM | POA: Diagnosis not present

## 2022-03-17 LAB — RAD ONC ARIA SESSION SUMMARY
Course Elapsed Days: 14
Plan Fractions Treated to Date: 11
Plan Prescribed Dose Per Fraction: 1.8 Gy
Plan Total Fractions Prescribed: 25
Plan Total Prescribed Dose: 45 Gy
Reference Point Dosage Given to Date: 19.8 Gy
Reference Point Session Dosage Given: 1.8 Gy
Session Number: 11

## 2022-03-18 ENCOUNTER — Other Ambulatory Visit: Payer: Self-pay

## 2022-03-18 ENCOUNTER — Ambulatory Visit
Admission: RE | Admit: 2022-03-18 | Discharge: 2022-03-18 | Disposition: A | Discharge status: Home / Self Care | Payer: PPO | Source: Ambulatory Visit | Attending: Radiation Oncology | Admitting: Radiation Oncology

## 2022-03-18 DIAGNOSIS — C241 Malignant neoplasm of ampulla of Vater: Secondary | ICD-10-CM | POA: Diagnosis not present

## 2022-03-18 DIAGNOSIS — Z510 Encounter for antineoplastic radiation therapy: Secondary | ICD-10-CM | POA: Diagnosis not present

## 2022-03-18 LAB — RAD ONC ARIA SESSION SUMMARY
Course Elapsed Days: 15
Plan Fractions Treated to Date: 12
Plan Prescribed Dose Per Fraction: 1.8 Gy
Plan Total Fractions Prescribed: 25
Plan Total Prescribed Dose: 45 Gy
Reference Point Dosage Given to Date: 21.6 Gy
Reference Point Session Dosage Given: 1.8 Gy
Session Number: 12

## 2022-03-19 ENCOUNTER — Ambulatory Visit
Admission: RE | Admit: 2022-03-19 | Discharge: 2022-03-19 | Disposition: A | Discharge status: Home / Self Care | Payer: PPO | Source: Ambulatory Visit | Attending: Radiation Oncology | Admitting: Radiation Oncology

## 2022-03-19 ENCOUNTER — Other Ambulatory Visit: Payer: Self-pay

## 2022-03-19 DIAGNOSIS — Z510 Encounter for antineoplastic radiation therapy: Secondary | ICD-10-CM | POA: Diagnosis not present

## 2022-03-19 DIAGNOSIS — C241 Malignant neoplasm of ampulla of Vater: Secondary | ICD-10-CM | POA: Diagnosis not present

## 2022-03-19 LAB — RAD ONC ARIA SESSION SUMMARY
Course Elapsed Days: 16
Plan Fractions Treated to Date: 13
Plan Prescribed Dose Per Fraction: 1.8 Gy
Plan Total Fractions Prescribed: 25
Plan Total Prescribed Dose: 45 Gy
Reference Point Dosage Given to Date: 23.4 Gy
Reference Point Session Dosage Given: 1.8 Gy
Session Number: 13

## 2022-03-20 ENCOUNTER — Ambulatory Visit
Admission: RE | Admit: 2022-03-20 | Discharge: 2022-03-20 | Disposition: A | Discharge status: Home / Self Care | Payer: PPO | Source: Ambulatory Visit | Attending: Radiation Oncology | Admitting: Radiation Oncology

## 2022-03-20 ENCOUNTER — Other Ambulatory Visit: Payer: Self-pay

## 2022-03-20 DIAGNOSIS — C241 Malignant neoplasm of ampulla of Vater: Secondary | ICD-10-CM | POA: Diagnosis not present

## 2022-03-20 DIAGNOSIS — Z510 Encounter for antineoplastic radiation therapy: Secondary | ICD-10-CM | POA: Diagnosis not present

## 2022-03-20 LAB — RAD ONC ARIA SESSION SUMMARY
Course Elapsed Days: 17
Plan Fractions Treated to Date: 14
Plan Prescribed Dose Per Fraction: 1.8 Gy
Plan Total Fractions Prescribed: 25
Plan Total Prescribed Dose: 45 Gy
Reference Point Dosage Given to Date: 25.2 Gy
Reference Point Session Dosage Given: 1.8 Gy
Session Number: 14

## 2022-03-21 ENCOUNTER — Ambulatory Visit
Admission: RE | Admit: 2022-03-21 | Discharge: 2022-03-21 | Disposition: A | Discharge status: Home / Self Care | Payer: PPO | Source: Ambulatory Visit | Attending: Radiation Oncology | Admitting: Radiation Oncology

## 2022-03-21 ENCOUNTER — Other Ambulatory Visit: Payer: Self-pay

## 2022-03-21 DIAGNOSIS — C241 Malignant neoplasm of ampulla of Vater: Secondary | ICD-10-CM | POA: Diagnosis not present

## 2022-03-21 DIAGNOSIS — Z510 Encounter for antineoplastic radiation therapy: Secondary | ICD-10-CM | POA: Diagnosis not present

## 2022-03-21 LAB — RAD ONC ARIA SESSION SUMMARY
Course Elapsed Days: 18
Plan Fractions Treated to Date: 15
Plan Prescribed Dose Per Fraction: 1.8 Gy
Plan Total Fractions Prescribed: 25
Plan Total Prescribed Dose: 45 Gy
Reference Point Dosage Given to Date: 27 Gy
Reference Point Session Dosage Given: 1.8 Gy
Session Number: 15

## 2022-03-24 ENCOUNTER — Other Ambulatory Visit: Payer: Self-pay

## 2022-03-24 ENCOUNTER — Ambulatory Visit
Admission: RE | Admit: 2022-03-24 | Discharge: 2022-03-24 | Disposition: A | Discharge status: Home / Self Care | Payer: PPO | Source: Ambulatory Visit | Attending: Radiation Oncology | Admitting: Radiation Oncology

## 2022-03-24 DIAGNOSIS — C241 Malignant neoplasm of ampulla of Vater: Secondary | ICD-10-CM | POA: Diagnosis not present

## 2022-03-24 DIAGNOSIS — Z510 Encounter for antineoplastic radiation therapy: Secondary | ICD-10-CM | POA: Diagnosis not present

## 2022-03-24 LAB — RAD ONC ARIA SESSION SUMMARY
Course Elapsed Days: 21
Plan Fractions Treated to Date: 16
Plan Prescribed Dose Per Fraction: 1.8 Gy
Plan Total Fractions Prescribed: 25
Plan Total Prescribed Dose: 45 Gy
Reference Point Dosage Given to Date: 28.8 Gy
Reference Point Session Dosage Given: 1.8 Gy
Session Number: 16

## 2022-03-25 ENCOUNTER — Encounter: Payer: Self-pay | Admitting: Gastroenterology

## 2022-03-25 ENCOUNTER — Other Ambulatory Visit: Payer: Self-pay

## 2022-03-25 ENCOUNTER — Ambulatory Visit
Admission: RE | Admit: 2022-03-25 | Discharge: 2022-03-25 | Disposition: A | Discharge status: Home / Self Care | Payer: PPO | Source: Ambulatory Visit | Attending: Radiation Oncology | Admitting: Radiation Oncology

## 2022-03-25 DIAGNOSIS — C241 Malignant neoplasm of ampulla of Vater: Secondary | ICD-10-CM | POA: Diagnosis not present

## 2022-03-25 DIAGNOSIS — Z510 Encounter for antineoplastic radiation therapy: Secondary | ICD-10-CM | POA: Diagnosis not present

## 2022-03-25 LAB — RAD ONC ARIA SESSION SUMMARY
Course Elapsed Days: 22
Plan Fractions Treated to Date: 17
Plan Prescribed Dose Per Fraction: 1.8 Gy
Plan Total Fractions Prescribed: 25
Plan Total Prescribed Dose: 45 Gy
Reference Point Dosage Given to Date: 30.6 Gy
Reference Point Session Dosage Given: 1.8 Gy
Session Number: 17

## 2022-03-26 ENCOUNTER — Ambulatory Visit
Admission: RE | Admit: 2022-03-26 | Discharge: 2022-03-26 | Disposition: A | Discharge status: Home / Self Care | Payer: PPO | Source: Ambulatory Visit | Attending: Radiation Oncology | Admitting: Radiation Oncology

## 2022-03-26 ENCOUNTER — Other Ambulatory Visit: Payer: Self-pay

## 2022-03-26 DIAGNOSIS — Z510 Encounter for antineoplastic radiation therapy: Secondary | ICD-10-CM | POA: Diagnosis not present

## 2022-03-26 DIAGNOSIS — C241 Malignant neoplasm of ampulla of Vater: Secondary | ICD-10-CM | POA: Diagnosis not present

## 2022-03-26 LAB — RAD ONC ARIA SESSION SUMMARY
Course Elapsed Days: 23
Plan Fractions Treated to Date: 18
Plan Prescribed Dose Per Fraction: 1.8 Gy
Plan Total Fractions Prescribed: 25
Plan Total Prescribed Dose: 45 Gy
Reference Point Dosage Given to Date: 32.4 Gy
Reference Point Session Dosage Given: 1.8 Gy
Session Number: 18

## 2022-03-27 ENCOUNTER — Other Ambulatory Visit: Payer: Self-pay

## 2022-03-27 ENCOUNTER — Ambulatory Visit
Admission: RE | Admit: 2022-03-27 | Discharge: 2022-03-27 | Disposition: A | Discharge status: Home / Self Care | Payer: PPO | Source: Ambulatory Visit | Attending: Radiation Oncology | Admitting: Radiation Oncology

## 2022-03-27 DIAGNOSIS — C241 Malignant neoplasm of ampulla of Vater: Secondary | ICD-10-CM | POA: Diagnosis not present

## 2022-03-27 DIAGNOSIS — Z510 Encounter for antineoplastic radiation therapy: Secondary | ICD-10-CM | POA: Diagnosis not present

## 2022-03-27 LAB — RAD ONC ARIA SESSION SUMMARY
Course Elapsed Days: 24
Plan Fractions Treated to Date: 19
Plan Prescribed Dose Per Fraction: 1.8 Gy
Plan Total Fractions Prescribed: 25
Plan Total Prescribed Dose: 45 Gy
Reference Point Dosage Given to Date: 34.2 Gy
Reference Point Session Dosage Given: 1.8 Gy
Session Number: 19

## 2022-03-28 ENCOUNTER — Ambulatory Visit
Admission: RE | Admit: 2022-03-28 | Discharge: 2022-03-28 | Disposition: A | Discharge status: Home / Self Care | Payer: PPO | Source: Ambulatory Visit | Attending: Radiation Oncology | Admitting: Radiation Oncology

## 2022-03-28 ENCOUNTER — Other Ambulatory Visit: Payer: Self-pay

## 2022-03-28 DIAGNOSIS — C241 Malignant neoplasm of ampulla of Vater: Secondary | ICD-10-CM | POA: Insufficient documentation

## 2022-03-28 DIAGNOSIS — Z510 Encounter for antineoplastic radiation therapy: Secondary | ICD-10-CM | POA: Diagnosis not present

## 2022-03-28 LAB — RAD ONC ARIA SESSION SUMMARY
Course Elapsed Days: 25
Plan Fractions Treated to Date: 20
Plan Prescribed Dose Per Fraction: 1.8 Gy
Plan Total Fractions Prescribed: 25
Plan Total Prescribed Dose: 45 Gy
Reference Point Dosage Given to Date: 36 Gy
Reference Point Session Dosage Given: 1.8 Gy
Session Number: 20

## 2022-03-31 ENCOUNTER — Ambulatory Visit
Admission: RE | Admit: 2022-03-31 | Discharge: 2022-03-31 | Disposition: A | Discharge status: Home / Self Care | Payer: PPO | Source: Ambulatory Visit | Attending: Radiation Oncology | Admitting: Radiation Oncology

## 2022-03-31 ENCOUNTER — Other Ambulatory Visit: Payer: Self-pay

## 2022-03-31 DIAGNOSIS — C241 Malignant neoplasm of ampulla of Vater: Secondary | ICD-10-CM | POA: Diagnosis not present

## 2022-03-31 DIAGNOSIS — Z510 Encounter for antineoplastic radiation therapy: Secondary | ICD-10-CM | POA: Diagnosis not present

## 2022-03-31 LAB — RAD ONC ARIA SESSION SUMMARY
Course Elapsed Days: 28
Plan Fractions Treated to Date: 21
Plan Prescribed Dose Per Fraction: 1.8 Gy
Plan Total Fractions Prescribed: 25
Plan Total Prescribed Dose: 45 Gy
Reference Point Dosage Given to Date: 37.8 Gy
Reference Point Session Dosage Given: 1.8 Gy
Session Number: 21

## 2022-04-01 ENCOUNTER — Other Ambulatory Visit: Payer: Self-pay

## 2022-04-01 ENCOUNTER — Ambulatory Visit
Admission: RE | Admit: 2022-04-01 | Discharge: 2022-04-01 | Disposition: A | Discharge status: Home / Self Care | Payer: PPO | Source: Ambulatory Visit | Attending: Radiation Oncology | Admitting: Radiation Oncology

## 2022-04-01 DIAGNOSIS — Z510 Encounter for antineoplastic radiation therapy: Secondary | ICD-10-CM | POA: Diagnosis not present

## 2022-04-01 DIAGNOSIS — C241 Malignant neoplasm of ampulla of Vater: Secondary | ICD-10-CM | POA: Diagnosis not present

## 2022-04-01 LAB — RAD ONC ARIA SESSION SUMMARY
Course Elapsed Days: 29
Plan Fractions Treated to Date: 22
Plan Prescribed Dose Per Fraction: 1.8 Gy
Plan Total Fractions Prescribed: 25
Plan Total Prescribed Dose: 45 Gy
Reference Point Dosage Given to Date: 39.6 Gy
Reference Point Session Dosage Given: 1.8 Gy
Session Number: 22

## 2022-04-02 ENCOUNTER — Ambulatory Visit: Admission: RE | Admit: 2022-04-02 | Payer: PPO | Source: Ambulatory Visit

## 2022-04-02 DIAGNOSIS — C241 Malignant neoplasm of ampulla of Vater: Secondary | ICD-10-CM | POA: Diagnosis not present

## 2022-04-02 DIAGNOSIS — Z510 Encounter for antineoplastic radiation therapy: Secondary | ICD-10-CM | POA: Diagnosis not present

## 2022-04-03 ENCOUNTER — Other Ambulatory Visit: Payer: Self-pay

## 2022-04-03 ENCOUNTER — Ambulatory Visit
Admission: RE | Admit: 2022-04-03 | Discharge: 2022-04-03 | Disposition: A | Discharge status: Home / Self Care | Payer: PPO | Source: Ambulatory Visit | Attending: Radiation Oncology | Admitting: Radiation Oncology

## 2022-04-03 DIAGNOSIS — Z510 Encounter for antineoplastic radiation therapy: Secondary | ICD-10-CM | POA: Diagnosis not present

## 2022-04-03 DIAGNOSIS — C241 Malignant neoplasm of ampulla of Vater: Secondary | ICD-10-CM | POA: Diagnosis not present

## 2022-04-03 LAB — RAD ONC ARIA SESSION SUMMARY
Course Elapsed Days: 31
Plan Fractions Treated to Date: 24
Plan Prescribed Dose Per Fraction: 1.8 Gy
Plan Total Fractions Prescribed: 25
Plan Total Prescribed Dose: 45 Gy
Reference Point Dosage Given to Date: 43.2 Gy
Reference Point Session Dosage Given: 2.3611 Gy
Session Number: 23

## 2022-04-04 ENCOUNTER — Ambulatory Visit
Admission: RE | Admit: 2022-04-04 | Discharge: 2022-04-04 | Disposition: A | Discharge status: Home / Self Care | Payer: PPO | Source: Ambulatory Visit | Attending: Radiation Oncology | Admitting: Radiation Oncology

## 2022-04-04 ENCOUNTER — Other Ambulatory Visit: Payer: Self-pay

## 2022-04-04 DIAGNOSIS — Z510 Encounter for antineoplastic radiation therapy: Secondary | ICD-10-CM | POA: Diagnosis not present

## 2022-04-04 DIAGNOSIS — C241 Malignant neoplasm of ampulla of Vater: Secondary | ICD-10-CM | POA: Diagnosis not present

## 2022-04-04 LAB — RAD ONC ARIA SESSION SUMMARY
Course Elapsed Days: 32
Plan Fractions Treated to Date: 25
Plan Prescribed Dose Per Fraction: 1.8 Gy
Plan Total Fractions Prescribed: 25
Plan Total Prescribed Dose: 45 Gy
Reference Point Dosage Given to Date: 45 Gy
Reference Point Session Dosage Given: 1.8 Gy
Session Number: 24

## 2022-04-07 ENCOUNTER — Ambulatory Visit
Admission: RE | Admit: 2022-04-07 | Discharge: 2022-04-07 | Disposition: A | Discharge status: Home / Self Care | Payer: PPO | Source: Ambulatory Visit | Attending: Radiation Oncology | Admitting: Radiation Oncology

## 2022-04-07 ENCOUNTER — Other Ambulatory Visit: Payer: Self-pay

## 2022-04-07 DIAGNOSIS — C241 Malignant neoplasm of ampulla of Vater: Secondary | ICD-10-CM | POA: Diagnosis not present

## 2022-04-07 DIAGNOSIS — Z510 Encounter for antineoplastic radiation therapy: Secondary | ICD-10-CM | POA: Diagnosis not present

## 2022-04-07 LAB — RAD ONC ARIA SESSION SUMMARY
Course Elapsed Days: 35
Plan Fractions Treated to Date: 1
Plan Prescribed Dose Per Fraction: 1.8 Gy
Plan Total Fractions Prescribed: 3
Plan Total Prescribed Dose: 5.4 Gy
Reference Point Dosage Given to Date: 1.8 Gy
Reference Point Session Dosage Given: 1.8 Gy
Session Number: 25

## 2022-04-08 ENCOUNTER — Telehealth: Payer: Self-pay | Admitting: Hematology

## 2022-04-08 ENCOUNTER — Ambulatory Visit
Admission: RE | Admit: 2022-04-08 | Discharge: 2022-04-08 | Disposition: A | Discharge status: Home / Self Care | Payer: PPO | Source: Ambulatory Visit | Attending: Radiation Oncology | Admitting: Radiation Oncology

## 2022-04-08 ENCOUNTER — Other Ambulatory Visit: Payer: Self-pay

## 2022-04-08 DIAGNOSIS — Z510 Encounter for antineoplastic radiation therapy: Secondary | ICD-10-CM | POA: Diagnosis not present

## 2022-04-08 DIAGNOSIS — C241 Malignant neoplasm of ampulla of Vater: Secondary | ICD-10-CM | POA: Diagnosis not present

## 2022-04-08 LAB — RAD ONC ARIA SESSION SUMMARY
Course Elapsed Days: 36
Plan Fractions Treated to Date: 2
Plan Prescribed Dose Per Fraction: 1.8 Gy
Plan Total Fractions Prescribed: 3
Plan Total Prescribed Dose: 5.4 Gy
Reference Point Dosage Given to Date: 3.6 Gy
Reference Point Session Dosage Given: 1.8 Gy
Session Number: 26

## 2022-04-08 NOTE — Telephone Encounter (Signed)
Contacted patient to scheduled appointments. Patient is aware of appointments that are scheduled.   

## 2022-04-08 NOTE — Assessment & Plan Note (Addendum)
-  diagnosed in 12/2021 -I reviewed PET scan findings, which showed no evidence of distant metastasis  -He was evaluated by pancreatobiliary surgeon Dr. Barry Dienes, and felt to be a poor candidate for Whipple surgery. -Patient has decided to proceed with radiation, I discussed the option of concurrent chemotherapy with Xeloda as a radiation sensitizer.  Potential benefit and side effect discussed with patient.  Patient has chronic kidney disease, we will repeat labs today to see if he is a candidate for Xeloda with dose reduction. -Her GFR today is 33, this is borderline for Xeloda.  Given his advanced age, relatively very early stage disease, and the limited benefit of concurrent chemotherapy, I do not recommend the Xeloda or 5-fu.  He will proceed with radiation alone. -I reviewed his molecular testing, MMR was normal, he is not a candidate for immunotherapy.  Foundation One showed no targetable mutations. -He is finishing RT today -He is scheduled for next EGD and ERCP in about 2 months. -Due to advanced age, I do not plan to offer him chemotherapy

## 2022-04-09 ENCOUNTER — Ambulatory Visit
Admission: RE | Admit: 2022-04-09 | Discharge: 2022-04-09 | Disposition: A | Discharge status: Home / Self Care | Payer: PPO | Source: Ambulatory Visit | Attending: Radiation Oncology | Admitting: Radiation Oncology

## 2022-04-09 ENCOUNTER — Encounter: Payer: Self-pay | Admitting: Hematology

## 2022-04-09 ENCOUNTER — Inpatient Hospital Stay: Payer: PPO | Admitting: Hematology

## 2022-04-09 ENCOUNTER — Inpatient Hospital Stay: Payer: PPO

## 2022-04-09 ENCOUNTER — Other Ambulatory Visit: Payer: Self-pay

## 2022-04-09 ENCOUNTER — Inpatient Hospital Stay (HOSPITAL_BASED_OUTPATIENT_CLINIC_OR_DEPARTMENT_OTHER): Payer: PPO | Admitting: Hematology

## 2022-04-09 ENCOUNTER — Encounter: Payer: Self-pay | Admitting: Radiation Oncology

## 2022-04-09 ENCOUNTER — Inpatient Hospital Stay: Payer: PPO | Attending: Physician Assistant

## 2022-04-09 VITALS — BP 119/66 | HR 78 | Temp 98.0°F | Resp 18 | Ht 68.0 in | Wt 224.9 lb

## 2022-04-09 DIAGNOSIS — E039 Hypothyroidism, unspecified: Secondary | ICD-10-CM | POA: Insufficient documentation

## 2022-04-09 DIAGNOSIS — Z7901 Long term (current) use of anticoagulants: Secondary | ICD-10-CM | POA: Diagnosis not present

## 2022-04-09 DIAGNOSIS — Z79899 Other long term (current) drug therapy: Secondary | ICD-10-CM | POA: Insufficient documentation

## 2022-04-09 DIAGNOSIS — N189 Chronic kidney disease, unspecified: Secondary | ICD-10-CM | POA: Insufficient documentation

## 2022-04-09 DIAGNOSIS — C241 Malignant neoplasm of ampulla of Vater: Secondary | ICD-10-CM | POA: Diagnosis not present

## 2022-04-09 DIAGNOSIS — N400 Enlarged prostate without lower urinary tract symptoms: Secondary | ICD-10-CM | POA: Insufficient documentation

## 2022-04-09 DIAGNOSIS — Z7984 Long term (current) use of oral hypoglycemic drugs: Secondary | ICD-10-CM | POA: Diagnosis not present

## 2022-04-09 DIAGNOSIS — Z510 Encounter for antineoplastic radiation therapy: Secondary | ICD-10-CM | POA: Diagnosis not present

## 2022-04-09 DIAGNOSIS — I4891 Unspecified atrial fibrillation: Secondary | ICD-10-CM | POA: Insufficient documentation

## 2022-04-09 LAB — COMPREHENSIVE METABOLIC PANEL
ALT: 10 U/L (ref 0–44)
AST: 24 U/L (ref 15–41)
Albumin: 3.2 g/dL — ABNORMAL LOW (ref 3.5–5.0)
Alkaline Phosphatase: 47 U/L (ref 38–126)
Anion gap: 6 (ref 5–15)
BUN: 13 mg/dL (ref 8–23)
CO2: 25 mmol/L (ref 22–32)
Calcium: 8.4 mg/dL — ABNORMAL LOW (ref 8.9–10.3)
Chloride: 111 mmol/L (ref 98–111)
Creatinine, Ser: 1.22 mg/dL (ref 0.61–1.24)
GFR, Estimated: 57 mL/min — ABNORMAL LOW (ref >60)
Glucose, Bld: 113 mg/dL — ABNORMAL HIGH (ref 70–99)
Potassium: 4 mmol/L (ref 3.5–5.1)
Sodium: 142 mmol/L (ref 135–145)
Total Bilirubin: 0.8 mg/dL (ref 0.3–1.2)
Total Protein: 6.8 g/dL (ref 6.5–8.1)

## 2022-04-09 LAB — CBC WITH DIFFERENTIAL/PLATELET
Abs Immature Granulocytes: 0.01 10*3/uL (ref 0.00–0.07)
Basophils Absolute: 0 10*3/uL (ref 0.0–0.1)
Basophils Relative: 1 %
Eosinophils Absolute: 0.3 10*3/uL (ref 0.0–0.5)
Eosinophils Relative: 7 %
HCT: 28.7 % — ABNORMAL LOW (ref 39.0–52.0)
Hemoglobin: 9.3 g/dL — ABNORMAL LOW (ref 13.0–17.0)
Immature Granulocytes: 0 %
Lymphocytes Relative: 28 %
Lymphs Abs: 1 10*3/uL (ref 0.7–4.0)
MCH: 34.2 pg — ABNORMAL HIGH (ref 26.0–34.0)
MCHC: 32.4 g/dL (ref 30.0–36.0)
MCV: 105.5 fL — ABNORMAL HIGH (ref 80.0–100.0)
Monocytes Absolute: 0.6 10*3/uL (ref 0.1–1.0)
Monocytes Relative: 17 %
Neutro Abs: 1.6 10*3/uL — ABNORMAL LOW (ref 1.7–7.7)
Neutrophils Relative %: 47 %
Platelets: 98 10*3/uL — ABNORMAL LOW (ref 150–400)
RBC: 2.72 MIL/uL — ABNORMAL LOW (ref 4.22–5.81)
RDW: 18.6 % — ABNORMAL HIGH (ref 11.5–15.5)
WBC: 3.4 10*3/uL — ABNORMAL LOW (ref 4.0–10.5)
nRBC: 0 % (ref 0.0–0.2)

## 2022-04-09 LAB — RAD ONC ARIA SESSION SUMMARY
Course Elapsed Days: 37
Plan Fractions Treated to Date: 3
Plan Prescribed Dose Per Fraction: 1.8 Gy
Plan Total Fractions Prescribed: 3
Plan Total Prescribed Dose: 5.4 Gy
Reference Point Dosage Given to Date: 5.4 Gy
Reference Point Session Dosage Given: 1.8 Gy
Session Number: 27

## 2022-04-09 NOTE — Progress Notes (Signed)
Center   [phone] Fax:([phone]   Clinic Follow up Note   Patient Care Team: Merrilee Seashore, MD as PCP - General (Internal Medicine) Michael Boston, MD as Consulting Physician (General Surgery) Gatha Mayer, MD as Consulting Physician (Gastroenterology) Izora Gala, MD as Consulting Physician (Otolaryngology) Adrian Prows, MD as Consulting Physician (Cardiology) Starlyn Skeans as Physician Assistant (Dermatology) Truitt Merle, MD as Consulting Physician (Oncology)  Date of Service:  04/09/2022  CHIEF COMPLAINT: f/u of Ampullary Adenocarcinoma   CURRENT THERAPY:  Radiation starting 03/03/2022   ASSESSMENT:  Carl Woods is a 89 y.o. 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 Barry Dienes, and felt to be a poor candidate for Whipple surgery. -Patient has decided to proceed with radiation, I discussed the option of concurrent chemotherapy with Xeloda as a radiation sensitizer.  Potential benefit and side effect discussed with patient.  Patient has chronic kidney disease, we will repeat labs today to see if he is a candidate for Xeloda with dose reduction. -Her GFR today is 33, this is borderline for Xeloda.  Given his advanced age, relatively very early stage disease, and the limited benefit of concurrent chemotherapy, I do not recommend the Xeloda or 5-fu.  He will proceed with radiation alone. -I reviewed his molecular testing, MMR was normal, he is not a candidate for immunotherapy.  Foundation One showed no targetable mutations. -He is finishing RT today -He is scheduled for next EGD and ERCP in about 2 months. -Due to advanced age, I do not plan to offer him chemotherapy -We discussed posttreatment evaluation by EGD, or scan such as MRI or PET scan    PLAN: -Lab reviewed -Pt   finishing RT today -Schedule for EDG and ERCP in 2 months, I sent a message to Dr. 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 There is moderate intrahepatic bile duct dilatation with marked fusiform dilatation of the common bile duct. No CT visible common bile duct stones identified. Differential considerations include a obstructing distal common bile duct stone (not visible by CT), distal CBD stricture, or mass at the level of the ampullary. Consider further evaluation with contrast enhanced MRI/MRCP or ERCP. 2. Contour the liver appears nodular which may reflect underlying cirrhosis. 3. Age-indeterminate compression fracture involving L1 vertebral body with loss of 50% of the vertebral body height. No signs of retropulsion of fracture fragments into the canal. 4. Fat containing umbilical hernia. 5. 5 mm anterolisthesis of L4 on L5. 6.  Aortic Atherosclerosis (ICD10-I70.0).   12/16/2021 Imaging   MR ABDOMEN MRCP W WO CONTAST   IMPRESSION: 1. Moderate intrahepatic and extrahepatic bile duct dilation with smooth tapering to the level of the ampulla without filling defect or focal mass lesion identified. Findings may reflect ampullary stricture or occult ampullary mass. Consider further evaluation with ERCP. 2. Mild dilated main pancreatic duct at the head measuring up to 8 mm. 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 Recommend follow-up pre and post-contrast MRI/MRCP in 2 years. This recommendation follows ACR consensus guidelines: Management of Incidental Pancreatic Cysts: A White Paper of the ACR Incidental Findings  Committee. J Am Coll Radiol [id number]. 3.  Aortic Atherosclerosis (ICD10-I70.0).   01/23/2022 Procedure   DG ERCP    IMPRESSION: Dilatation of the extrahepatic bile duct with severe tapering or narrowing in the distal common bile duct. Placement of biliary stent.   These images were submitted for radiologic interpretation only. Please see the procedural report for the amount of contrast.    01/23/2022 Procedure   EUS/ERCP  ENDOSONOGRAPHIC FINDING: There was dilation in the common bile duct (12.3 mm -> 20.4 mm) and in the common hepatic duct (23.0 mm). A small amount of hyperechoic material consistent with sludge was visualized endosonographically in the common bile duct. Moderate hyperechoic material consistent with sludge was visualized endosonographically in the gallbladder with normal gallbladder wall thickness. Pancreatic parenchymal abnormalities were noted in the entire pancreas. These consisted of hyperechoic foci with shadowing and cysts. There was a 9.1 mm by 6.1 mm cyst in the neck of the pancreas. There was a 15.3 mm by 14.1 mm cyst in the tail of the pancreas. The pancreatic duct had a dilated endosonographic appearance, had a prominently branched endosonographic appearance and had hyperechoic walls in the pancreatic head (PD - 8.2 mm -> 4.3 mm), genu of the pancreas (3.5 mm), body of the pancreas (3.2 mm) and tail of the pancreas (2.8 mm). A hypoechoic irregular lesion was identified endosonographically at the ampulla. The lesion measured 15 mm by 13 mm in maximal cross-sectional diameter. The lesion extended from the mucosa to the submucosa. The outer margins were irregular. This is noted where CBD and PD dilation is noted to occur. Endosonographic imaging in the visualized portion of the liver showed no mass.  No malignant-appearing lymph nodes were visualized in the celiac region (level 20), peripancreatic region and porta hepatis region. The celiac region was visualized.   01/23/2022 Pathology Results   SURGICAL PATHOLOGY  CASE: [id number]  PATIENT: Carl Woods   Surgical Pathology Report   FINAL MICROSCOPIC DIAGNOSIS:   A. STOMACH, RANDOM, BIOPSY:  - Gastric mucosa, no significant abnormality.  No inflammation,  intestinal metaplasia, dysplasia or malignancy.   B. AMPULLARY LESION, BIOPSY:  - Adenocarcinoma.  See comment.    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   Increase small right pleural effusion compared to the previous MRI.   Subtle nodular tissue along the margin of the right hepatic lobe in the upper abdomen posteriorly. Recommend additional workup when appropriate.   Calcified pleural plaques.   Evidence of chronic liver disease.   Aortic Atherosclerosis (ICD10-I70.0).   02/14/2022 Miscellaneous   Foundation One:  Biomarker Findings Microsatellite status-Cannot be determined Tumor Mutation Burden-Cannot be determined  Genomics Findings EPHB4 amplificatio SF3B1 [id number]       INTERVAL HISTORY:  Carl Woods is here for a follow up of Ampullary Adenocarcinoma  He was last seen by me on 02/21/2022 He presents to the clinic accompanied by daughter. Pt state that he has a lot of diarrhea. Pt state he has some fatigue from radiation.       All other systems were reviewed with the patient and are negative.  MEDICAL HISTORY:  Past Medical History:  Diagnosis Date   Anal fistula    Arthritis    Fingers and hands   Atrial fibrillation ([place])    AVM (arteriovenous malformation)    Clipped during  Colonoscopy 05/2016   Barrett's esophagus    Bilateral cataracts    BPH (benign prostatic hyperplasia)    Cecal angiodysplasia 05/28/2016   ablated at colonoscopy   Deviated nasal septum    Diverticulosis of sigmoid colon    E. coli infection    Fatty liver    GERD (gastroesophageal reflux disease)    History of colon polyps    Hypothyroidism    Iron  deficiency anemia    Nodular basal cell carcinoma ([facility]) 11/05/2017   Left Forehead (treatment after biopsy)   OSA on CPAP    Perianal rash    Recurrent epistaxis    Renal cyst 01/04/2013   Small left peripelvic renal cysts , noted on US Renal   SCCA (squamous cell carcinoma) of skin 10/17/2015   Left Sup Bridge of Nose (curet, cautery and 5FU)   Seasonal allergies    Superficial basal cell carcinoma ([facility]) 10/17/2015   Left Bulb of Nose (curet, cautery and 5FU)   Tubular adenoma     SURGICAL HISTORY: Past Surgical History:  Procedure Laterality Date   BILIARY STENT PLACEMENT N/A 01/23/2022   Procedure: BILIARY STENT PLACEMENT;  Surgeon: Irving Copas., MD;  Location: [facility];  Service: Gastroenterology;  Laterality: N/A;   BIOPSY  01/23/2022   Procedure: BIOPSY;  Surgeon: Rush Landmark Telford Nab., MD;  Location: [facility];  Service: Gastroenterology;;   COLONOSCOPY  multiple   CYSTOSCOPY     ENDOSCOPIC RETROGRADE CHOLANGIOPANCREATOGRAPHY (ERCP) WITH PROPOFOL N/A 01/23/2022   Procedure: ENDOSCOPIC RETROGRADE CHOLANGIOPANCREATOGRAPHY (ERCP) WITH PROPOFOL;  Surgeon: Irving Copas., MD;  Location: [facility];  Service: Gastroenterology;  Laterality: N/A;   ESOPHAGOGASTRODUODENOSCOPY  multiple   ESOPHAGOGASTRODUODENOSCOPY N/A 01/23/2022   Procedure: ESOPHAGOGASTRODUODENOSCOPY (EGD);  Surgeon: Irving Copas., MD;  Location: [facility];  Service: Gastroenterology;  Laterality: N/A;   EUS N/A 01/23/2022   Procedure: UPPER ENDOSCOPIC ULTRASOUND (EUS) RADIAL;  Surgeon: Irving Copas., MD;  Location: [facility];  Service: Gastroenterology;  Laterality: N/A;   EVALUATION UNDER ANESTHESIA WITH FISTULECTOMY N/A 03/02/2018   Procedure: ANORECTAL EXAM UNDER ANESTHESIA WITH REPAIR OF SUPERFICIAL PERIRECTAL FISTULA AND HEMORRHOIDECTOMY;  Surgeon: Michael Boston, MD;  Location: [facility];  Service: General;  Laterality: N/A;   EXPLORATORY LAPAROTOMY      REMOVAL OF STONES  01/23/2022   Procedure: REMOVAL OF STONES;  Surgeon: Irving Copas., MD;  Location: [facility];  Service: Gastroenterology;;   Joan Mayans  01/23/2022   Procedure: Joan Mayans;  Surgeon: Mansouraty, Telford Nab., MD;  Location: [facility];  Service: Gastroenterology;;    I have reviewed the social history and family history with the patient and they are unchanged from previous note.  ALLERGIES:  has No Known Allergies.  MEDICATIONS:  Current Outpatient Medications  Medication Sig Dispense Refill   apixaban (ELIQUIS) 5 MG TABS tablet Take 1 tablet (5 mg total) by mouth 2 (two) times daily. 60 tablet    Cholecalciferol (VITAMIN D3) 5000 units CAPS Take 1 capsule by mouth every evening.     dapagliflozin propanediol (FARXIGA) 10 MG TABS tablet Take 10 mg by mouth in the morning.     Fe Fum-Fe Poly-Vit C-Lactobac (FUSION) 65-65-25-30 MG CAPS Take 1 capsule by mouth 3 (three) times a week.     fexofenadine (ALLEGRA) 180 MG tablet Take 180 mg by mouth as needed for allergies or rhinitis.     furosemide (LASIX) 20 MG tablet Take 20 mg by mouth in the morning.     Glucosamine-Chondroit-Vit C-Mn (GLUCOSAMINE CHONDR  500 COMPLEX PO) Take 1 tablet by mouth daily.      ketoconazole (NIZORAL) 2 % cream Apply 1 Application topically 2 (two) times daily as needed (skin rash/irritation.).     levothyroxine (SYNTHROID, LEVOTHROID) 112 MCG tablet Take 112 mcg by mouth daily before breakfast.      losartan (COZAAR) 25 MG tablet Take 25 mg by mouth in the morning.     metoprolol succinate (TOPROL-XL) 25 MG 24 hr tablet TAKE 1 TABLET (25 MG TOTAL) BY MOUTH DAILY. TAKE WITH OR IMMEDIATELY FOLLOWING A MEAL. 90 tablet 3   nitroGLYCERIN (NITROSTAT) 0.4 MG SL tablet Place 1 tablet (0.4 mg total) under the tongue every 5 (five) minutes as needed for up to 25 days for chest pain. 25 tablet 3   omeprazole (PRILOSEC) 20 MG capsule Take 1 capsule (20 mg total) by mouth 2 (two) times  daily before a meal. Twice daily for 1 month.  Then return back to daily dosing. 60 capsule 6   sodium chloride (OCEAN) 0.65 % SOLN nasal spray Place 1 spray into both nostrils as needed for congestion.     No current facility-administered medications for this visit.    PHYSICAL EXAMINATION: ECOG PERFORMANCE STATUS: 2 - Symptomatic, <50% confined to bed  Vitals:   04/09/22 1113  BP: 119/66  Pulse: 78  Resp: 18  Temp: 98 F (36.7 C)  SpO2: 100%   Wt Readings from Last 3 Encounters:  04/09/22 224 lb 14.4 oz (102 kg)  02/21/22 235 lb 6.4 oz (106.8 kg)  02/18/22 235 lb 12.8 oz (107 kg)     GENERAL:alert, no distress and comfortable SKIN: skin color normal, no rashes or significant lesions EYES: normal, Conjunctiva are pink and non-injected, sclera clear  NEURO: alert & oriented x 3 with fluent speech  ABDOMEN:(-)abdomen soft, non-tender and normal bowel sounds   LABORATORY DATA:  I have reviewed the data as listed    Latest Ref Rng & Units 04/09/2022   11:32 AM 02/21/2022    8:11 AM 12/25/2021    3:07 PM  CBC  WBC 4.0 - 10.5 K/uL 3.4  7.0  5.8   Hemoglobin 13.0 - 17.0 g/dL 9.3  10.4  10.6   Hematocrit 39.0 - 52.0 % 28.7  31.2  31.6   Platelets 150 - 400 K/uL 98  158  158.0         Latest Ref Rng & Units 04/09/2022   11:32 AM 02/21/2022    8:11 AM 02/07/2022    1:10 PM  CMP  Glucose 70 - 99 mg/dL 113  98    BUN 8 - 23 mg/dL 13  25    Creatinine 0.61 - 1.24 mg/dL 1.22  1.94  1.90   Sodium 135 - 145 mmol/L 142  140    Potassium 3.5 - 5.1 mmol/L 4.0  4.4    Chloride 98 - 111 mmol/L 111  108    CO2 22 - 32 mmol/L 25  25    Calcium 8.9 - 10.3 mg/dL 8.4  9.3    Total Protein 6.5 - 8.1 g/dL 6.8  7.0    Total Bilirubin 0.3 - 1.2 mg/dL 0.8  0.5    Alkaline Phos 38 - 126 U/L 47  57    AST 15 - 41 U/L 24  15    ALT 0 - 44 U/L 10  10        RADIOGRAPHIC STUDIES: I have personally reviewed the radiological images as listed and agreed  with the findings in the  report. No results found.    Orders Placed This Encounter  Procedures   CBC with Differential/Platelet    Standing Status:   Standing    Number of Occurrences:   50    Standing Expiration Date:   04/09/2023   Comprehensive metabolic panel    Standing Status:   Standing    Number of Occurrences:   50    Standing Expiration Date:   04/09/2023   Cancer antigen 19-9    Standing Status:   Standing    Number of Occurrences:   20    Standing Expiration Date:   04/09/2023   CEA (IN HOUSE-[facility])    Standing Status:   Future    Standing Expiration Date:   04/09/2023   All questions were answered. The patient knows to call the clinic with any problems, questions or concerns. No barriers to learning was detected. The total time spent in the appointment was 25 minutes.     Truitt Merle, MD 04/09/2022   Felicity Coyer, CMA, am acting as scribe for Truitt Merle, MD.   I have reviewed the above documentation for accuracy and completeness, and I agree with the above.

## 2022-04-11 LAB — CANCER ANTIGEN 19-9: CA 19-9: 16 U/mL (ref 0–35)

## 2022-04-14 NOTE — Progress Notes (Signed)
                                                                                                                                                             Patient Name: Carl Woods MRN: [id number] DOB: July 13, 1931 Referring Physician: Truitt Merle (Profile Not Attached) Date of Service: 04/09/2022 [facility], [place]                                                        End Of Treatment Note  Diagnoses: C24.1-Malignant neoplasm of ampulla of Vater  Cancer Staging: Stage cT2N0M0, Ampullary Adenocarcinoma.   Intent: Curative  Radiation Treatment Dates: 03/03/2022 through 04/09/2022 Site Technique Total Dose (Gy) Dose per Fx (Gy) Completed Fx Beam Energies  Pancreas: Pancreas IMRT 45/45 1.8 25/25 6X  Pancreas: Pancreas_Bst IMRT 5.4/5.4 1.8 3/3 6X   Narrative: The patient tolerated radiation therapy relatively well. He developed fatigue and anticipated skin changes in the treatment field. He also noted some discomfort in his abdomen as well as loose stool.   Plan: The patient will receive a call in about one month from the radiation oncology department. He will continue follow up with Dr. Burr Medico as well and have his stent exchanged with Dr. Rush Landmark in May 2024.  ________________________________________________    Carola Rhine, [facility]

## 2022-05-26 ENCOUNTER — Ambulatory Visit
Admission: RE | Admit: 2022-05-26 | Discharge: 2022-05-26 | Disposition: A | Discharge status: Home / Self Care | Payer: PPO | Source: Ambulatory Visit | Attending: Nurse Practitioner | Admitting: Nurse Practitioner

## 2022-05-26 NOTE — Progress Notes (Signed)
  Radiation Oncology         [phone] ________________________________  Name: Carl Woods MRN: [id number]  Date of Service: 05/26/2022  DOB: 04-04-1931  Post Treatment Telephone Note  Diagnosis:  Stage cT2N0M0, Ampullary Adenocarcinoma.   Intent: Curative  Radiation Treatment Dates: 03/03/2022 through 04/09/2022 Site Technique Total Dose (Gy) Dose per Fx (Gy) Completed Fx Beam Energies  Pancreas: Pancreas IMRT 45/45 1.8 25/25 6X  Pancreas: Pancreas_Bst IMRT 5.4/5.4 1.8 3/3 6X   (as documented in provider EOT note)   The patient was available for call today.   Symptoms of fatigue have improved since completing therapy.  Symptoms of skin changes have improved since completing therapy.  Symptoms of nausea or vomiting have improved since completing therapy.   The patient has scheduled follow up with his medical oncologist Dr. Mosetta Putt for ongoing surveillance, and was encouraged to call if he  develops concerns or questions regarding radiation.   This concludes the interview.   Ruel Favors, LPN

## 2022-06-04 ENCOUNTER — Ambulatory Visit: Payer: PPO | Admitting: Student

## 2022-06-12 ENCOUNTER — Telehealth: Payer: Self-pay | Admitting: Hematology

## 2022-06-12 ENCOUNTER — Inpatient Hospital Stay (HOSPITAL_BASED_OUTPATIENT_CLINIC_OR_DEPARTMENT_OTHER): Payer: PPO | Admitting: Hematology

## 2022-06-12 ENCOUNTER — Other Ambulatory Visit: Payer: Self-pay

## 2022-06-12 ENCOUNTER — Inpatient Hospital Stay: Payer: PPO

## 2022-06-12 ENCOUNTER — Inpatient Hospital Stay: Payer: PPO | Attending: Physician Assistant

## 2022-06-12 ENCOUNTER — Encounter: Payer: Self-pay | Admitting: Hematology

## 2022-06-12 VITALS — BP 123/46 | HR 69 | Temp 97.7°F | Resp 20 | Ht 68.0 in | Wt 230.9 lb

## 2022-06-12 DIAGNOSIS — C241 Malignant neoplasm of ampulla of Vater: Secondary | ICD-10-CM | POA: Insufficient documentation

## 2022-06-12 DIAGNOSIS — D649 Anemia, unspecified: Secondary | ICD-10-CM

## 2022-06-12 LAB — SAMPLE TO BLOOD BANK

## 2022-06-12 LAB — COMPREHENSIVE METABOLIC PANEL
ALT: 11 U/L (ref 0–44)
AST: 21 U/L (ref 15–41)
Albumin: 3.2 g/dL — ABNORMAL LOW (ref 3.5–5.0)
Alkaline Phosphatase: 67 U/L (ref 38–126)
Anion gap: 6 (ref 5–15)
BUN: 16 mg/dL (ref 8–23)
CO2: 25 mmol/L (ref 22–32)
Calcium: 8.4 mg/dL — ABNORMAL LOW (ref 8.9–10.3)
Chloride: 109 mmol/L (ref 98–111)
Creatinine, Ser: 1.27 mg/dL — ABNORMAL HIGH (ref 0.61–1.24)
GFR, Estimated: 54 mL/min — ABNORMAL LOW (ref >60)
Glucose, Bld: 142 mg/dL — ABNORMAL HIGH (ref 70–99)
Potassium: 4 mmol/L (ref 3.5–5.1)
Sodium: 140 mmol/L (ref 135–145)
Total Bilirubin: 0.5 mg/dL (ref 0.3–1.2)
Total Protein: 6.4 g/dL — ABNORMAL LOW (ref 6.5–8.1)

## 2022-06-12 LAB — CBC WITH DIFFERENTIAL/PLATELET
Abs Immature Granulocytes: 0.01 10*3/uL (ref 0.00–0.07)
Basophils Absolute: 0 10*3/uL (ref 0.0–0.1)
Basophils Relative: 1 %
Eosinophils Absolute: 0.2 10*3/uL (ref 0.0–0.5)
Eosinophils Relative: 5 %
HCT: 22.6 % — ABNORMAL LOW (ref 39.0–52.0)
Hemoglobin: 7.3 g/dL — ABNORMAL LOW (ref 13.0–17.0)
Immature Granulocytes: 0 %
Lymphocytes Relative: 34 %
Lymphs Abs: 1.6 10*3/uL (ref 0.7–4.0)
MCH: 37.1 pg — ABNORMAL HIGH (ref 26.0–34.0)
MCHC: 32.3 g/dL (ref 30.0–36.0)
MCV: 114.7 fL — ABNORMAL HIGH (ref 80.0–100.0)
Monocytes Absolute: 0.5 10*3/uL (ref 0.1–1.0)
Monocytes Relative: 10 %
Neutro Abs: 2.5 10*3/uL (ref 1.7–7.7)
Neutrophils Relative %: 50 %
Platelets: 130 10*3/uL — ABNORMAL LOW (ref 150–400)
RBC: 1.97 MIL/uL — ABNORMAL LOW (ref 4.22–5.81)
RDW: 17.9 % — ABNORMAL HIGH (ref 11.5–15.5)
WBC: 4.8 10*3/uL (ref 4.0–10.5)
nRBC: 0 % (ref 0.0–0.2)

## 2022-06-12 LAB — TYPE AND SCREEN: Unit division: 0

## 2022-06-12 LAB — PREPARE RBC (CROSSMATCH)

## 2022-06-12 LAB — VITAMIN B12: Vitamin B-12: 197 pg/mL (ref 180–914)

## 2022-06-12 LAB — BPAM RBC

## 2022-06-12 LAB — FERRITIN: Ferritin: 21 ng/mL — ABNORMAL LOW (ref 24–336)

## 2022-06-12 LAB — ABO/RH: ABO/RH(D): A NEG

## 2022-06-12 LAB — CEA (IN HOUSE-CHCC): CEA (CHCC-In House): 1.35 ng/mL (ref 0.00–5.00)

## 2022-06-12 NOTE — Assessment & Plan Note (Signed)
diagnosed in 12/2021 -I reviewed PET scan findings, which showed no evidence of distant metastasis  -He was evaluated by pancreatobiliary surgeon Dr. Donell Beers, and felt to be a poor candidate for Whipple surgery. -Patient has decided to proceed with radiation, I discussed the option of concurrent chemotherapy with Xeloda as a radiation sensitizer.  Potential benefit and side effect discussed with patient.  Patient has chronic kidney disease, we will repeat labs today to see if he is a candidate for Xeloda with dose reduction. -Her GFR today is 33, this is borderline for Xeloda.  Given his advanced age, relatively very early stage disease, and the limited benefit of concurrent chemotherapy, I do not recommend the Xeloda or 5-fu.  He will proceed with radiation alone. -I reviewed his molecular testing, MMR was normal, he is not a candidate for immunotherapy.  Foundation One showed no targetable mutations. -He is finishing RT on 04/09/2022 -He is scheduled for next EGD and ERCP in about 2 months. -Due to advanced age, I do not plan to offer him chemotherapy -We discussed posttreatment evaluation by EGD, or scan such as MRI or PET scan

## 2022-06-12 NOTE — Progress Notes (Signed)
[facility]   [phone] Fax:([phone]) [phone]   Clinic Follow up Note   Patient Care Team: [name], MD as PCP - General (Internal Medicine) [name], MD as Consulting Physician (General Surgery) [name], MD as Consulting Physician (Gastroenterology) [name], MD as Consulting Physician (Otolaryngology) [name], MD as Consulting Physician (Cardiology) Suzi Roots as Physician Assistant (Dermatology) Malachy Mood, MD as Consulting Physician (Oncology)  Date of Service:  06/12/2022  CHIEF COMPLAINT: f/u of  Ampullary Adenocarcinoma     CURRENT THERAPY:    ASSESSMENT:  Carl Woods is a 89 y.o. male with   Cancer of ampulla of Vater (HCC) diagnosed in 12/2021 -I reviewed PET scan findings, which showed no evidence of distant metastasis  -He was evaluated by pancreatobiliary surgeon Dr. Donell Beers, and felt to be a poor candidate for Whipple surgery. -Patient has decided to proceed with radiation, I discussed the option of concurrent chemotherapy with Xeloda as a radiation sensitizer.  Potential benefit and side effect discussed with patient.  Patient has chronic kidney disease, we will repeat labs today to see if he is a candidate for Xeloda with dose reduction. -Her GFR today is 33, this is borderline for Xeloda.  Given his advanced age, relatively very early stage disease, and the limited benefit of concurrent chemotherapy, I do not recommend the Xeloda or 5-fu.  He will proceed with radiation alone. -I reviewed his molecular testing, MMR was normal, he is not a candidate for immunotherapy.  Foundation One showed no targetable mutations. -He is finishing RT on 04/09/2022 -Due to advanced age, I do not plan to offer him chemotherapy -We discussed posttreatment evaluation by EGD, or scan such as MRI or PET scan -he is due for EGD and stent replacement, reach out to Dr. Meridee Score.      worsening anemia -Hemoglobin 7.3  today, much worse than before.  He denies any GI bleeding. -Will give 1 unit of blood transfusion in the next few days, I will check iron and B12 level, to see if he needs IV iron or B12 injection      PLAN: -lab reviewed -hg 7.3 -lab add on today for blood sample,Iron and B12 -plan to give 1 unit of blood next few days  -I sent a message to Dr. Meridee Score for his stent exchange  -lab and f/u in 2 months with lab in one month   SUMMARY OF ONCOLOGIC HISTORY: Oncology History  Cancer of ampulla of Vater (HCC)  12/06/2021 Imaging   CT ABDOMEN PELVIS W CONTRAST   IMPRESSION: 1. There is moderate intrahepatic bile duct dilatation with marked fusiform dilatation of the common bile duct. No CT visible common bile duct stones identified. Differential considerations include a obstructing distal common bile duct stone (not visible by CT), distal CBD stricture, or mass at the level of the ampullary. Consider further evaluation with contrast enhanced MRI/MRCP or ERCP. 2. Contour the liver appears nodular which may reflect underlying cirrhosis. 3. Age-indeterminate compression fracture involving L1 vertebral body with loss of 50% of the vertebral body height. No signs of retropulsion of fracture fragments into the canal. 4. Fat containing umbilical hernia. 5. 5 mm anterolisthesis of L4 on L5. 6.  Aortic Atherosclerosis (ICD10-I70.0).   12/16/2021 Imaging   MR ABDOMEN MRCP W WO CONTAST   IMPRESSION: 1. Moderate intrahepatic and extrahepatic bile duct dilation with smooth tapering to the level of the ampulla without filling defect or focal mass lesion identified. Findings may  reflect ampullary stricture or occult ampullary mass. Consider further evaluation with ERCP. 2. Mild dilated main pancreatic duct at the head measuring up to 8 mm. 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 Recommend  follow-up pre and post-contrast MRI/MRCP in 2 years. This recommendation follows ACR consensus guidelines: Management of Incidental Pancreatic Cysts: A White Paper of the ACR Incidental Findings Committee. J Am Coll Radiol 2017;14:911-923. 3.  Aortic Atherosclerosis (ICD10-I70.0).   01/23/2022 Procedure   DG ERCP   IMPRESSION: Dilatation of the extrahepatic bile duct with severe tapering or narrowing in the distal common bile duct. Placement of biliary stent.   These images were submitted for radiologic interpretation only. Please see the procedural report for the amount of contrast.    01/23/2022 Procedure   EUS/ERCP  ENDOSONOGRAPHIC FINDING: There was dilation in the common bile duct (12.3 mm -> 20.4 mm) and in the common hepatic duct (23.0 mm). A small amount of hyperechoic material consistent with sludge was visualized endosonographically in the common bile duct. Moderate hyperechoic material consistent with sludge was visualized endosonographically in the gallbladder with normal gallbladder wall thickness. Pancreatic parenchymal abnormalities were noted in the entire pancreas. These consisted of hyperechoic foci with shadowing and cysts. There was a 9.1 mm by 6.1 mm cyst in the neck of the pancreas. There was a 15.3 mm by 14.1 mm cyst in the tail of the pancreas. The pancreatic duct had a dilated endosonographic appearance, had a prominently branched endosonographic appearance and had hyperechoic walls in the pancreatic head (PD - 8.2 mm -> 4.3 mm), genu of the pancreas (3.5 mm), body of the pancreas (3.2 mm) and tail of the pancreas (2.8 mm). A hypoechoic irregular lesion was identified endosonographically at the ampulla. The lesion measured 15 mm by 13 mm in maximal cross-sectional diameter. The lesion extended from the mucosa to the submucosa. The outer margins were irregular. This is noted where CBD and PD dilation is noted to occur. Endosonographic imaging in the  visualized portion of the liver showed no mass.  No malignant-appearing lymph nodes were visualized in the celiac region (level 20), peripancreatic region and porta hepatis region. The celiac region was visualized.   01/23/2022 Pathology Results   SURGICAL PATHOLOGY  CASE: [id number]  PATIENT: Charlesetta Ivory  Surgical Pathology Report   FINAL MICROSCOPIC DIAGNOSIS:   A. STOMACH, RANDOM, BIOPSY:  - Gastric mucosa, no significant abnormality.  No inflammation,  intestinal metaplasia, dysplasia or malignancy.   B. AMPULLARY LESION, BIOPSY:  - Adenocarcinoma.  See comment.    01/31/2022 Initial Diagnosis   Cancer of ampulla of Vater (HCC)   02/07/2022 Imaging    IMPRESSION: Interval increase in size of lymph node in the anterior cardiophrenic space on the right.   Increase small right pleural effusion compared to the previous MRI.   Subtle nodular tissue along the margin of the right hepatic lobe in the upper abdomen posteriorly. Recommend additional workup when appropriate.   Calcified pleural plaques.   Evidence of chronic liver disease.   Aortic Atherosclerosis (ICD10-I70.0).   02/14/2022 Miscellaneous   Foundation One:  Biomarker Findings Microsatellite status-Cannot be determined Tumor Mutation Burden-Cannot be determined  Genomics Findings EPHB4 amplificatio SF3B1 V409W TP53 R273H       INTERVAL HISTORY:  Carl Woods is here for a follow up of  Ampullary Adenocarcinoma . He was last seen by me on 04/09/2022. He presents to the clinic accompanied by  son. Pt reports of still having low energy and tiredness in his back. Pt reports of mowing the lawn and walks to the mail box.He state he has some chest discomfort when he is mobile. He describe the discomfort as heavy on the chest without pain. He denies having abdominal pain.     All other systems were reviewed with the patient and are negative.  MEDICAL HISTORY:  Past Medical History:  Diagnosis Date    Anal fistula    Arthritis    Fingers and hands   Atrial fibrillation (HCC)    AVM (arteriovenous malformation)    Clipped during Colonoscopy 05/2016   Barrett's esophagus    Bilateral cataracts    BPH (benign prostatic hyperplasia)    Cecal angiodysplasia 05/28/2016   ablated at colonoscopy   Deviated nasal septum    Diverticulosis of sigmoid colon    E. 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   Left Forehead (treatment after biopsy)   OSA on CPAP    Perianal rash    Recurrent epistaxis    Renal cyst 01/04/2013   Small left peripelvic renal cysts , noted on US Renal   SCCA (squamous cell carcinoma) of skin 10/17/2015   Left Sup Bridge of Nose (curet, cautery and 5FU)   Seasonal allergies    Superficial basal cell carcinoma (BCC) 10/17/2015   Left Bulb of Nose (curet, cautery and 5FU)   Tubular adenoma     SURGICAL HISTORY: Past Surgical History:  Procedure Laterality Date   BILIARY STENT PLACEMENT N/A 01/23/2022   Procedure: BILIARY STENT PLACEMENT;  Surgeon: Lemar Lofty., MD;  Location: [facility];  Service: Gastroenterology;  Laterality: N/A;   BIOPSY  01/23/2022   Procedure: BIOPSY;  Surgeon: Meridee Score Netty Starring., MD;  Location: [facility];  Service: Gastroenterology;;   COLONOSCOPY  multiple   CYSTOSCOPY     ENDOSCOPIC RETROGRADE CHOLANGIOPANCREATOGRAPHY (ERCP) WITH PROPOFOL N/A 01/23/2022   Procedure: ENDOSCOPIC RETROGRADE CHOLANGIOPANCREATOGRAPHY (ERCP) WITH PROPOFOL;  Surgeon: Lemar Lofty., MD;  Location: [facility];  Service: Gastroenterology;  Laterality: N/A;   ESOPHAGOGASTRODUODENOSCOPY  multiple   ESOPHAGOGASTRODUODENOSCOPY N/A 01/23/2022   Procedure: ESOPHAGOGASTRODUODENOSCOPY (EGD);  Surgeon: Lemar Lofty., MD;  Location: [facility];  Service: Gastroenterology;   Laterality: N/A;   EUS N/A 01/23/2022   Procedure: UPPER ENDOSCOPIC ULTRASOUND (EUS) RADIAL;  Surgeon: Lemar Lofty., MD;  Location: [facility];  Service: Gastroenterology;  Laterality: N/A;   EVALUATION UNDER ANESTHESIA WITH FISTULECTOMY N/A 03/02/2018   Procedure: ANORECTAL EXAM UNDER ANESTHESIA WITH REPAIR OF SUPERFICIAL PERIRECTAL FISTULA AND HEMORRHOIDECTOMY;  Surgeon: [name], MD;  Location: [facility];  Service: General;  Laterality: N/A;   EXPLORATORY LAPAROTOMY     REMOVAL OF STONES  01/23/2022   Procedure: REMOVAL OF STONES;  Surgeon: Lemar Lofty., MD;  Location: [facility];  Service: Gastroenterology;;   Dennison Mascot  01/23/2022   Procedure: Dennison Mascot;  Surgeon: Mansouraty, Netty Starring., MD;  Location: [facility];  Service: Gastroenterology;;    I have reviewed the social history and family history with the patient and they are unchanged from previous note.  ALLERGIES:  has No Known Allergies.  MEDICATIONS:  Current Outpatient Medications  Medication Sig Dispense Refill   apixaban (ELIQUIS) 5 MG TABS tablet Take 1 tablet (5 mg total) by mouth 2 (two)  times daily. 60 tablet    Cholecalciferol (VITAMIN D3) 5000 units CAPS Take 1 capsule by mouth every evening.     dapagliflozin propanediol (FARXIGA) 10 MG TABS tablet Take 10 mg by mouth in the morning.     Fe Fum-Fe Poly-Vit C-Lactobac (FUSION) 65-65-25-30 MG CAPS Take 1 capsule by mouth 3 (three) times a week.     fexofenadine (ALLEGRA) 180 MG tablet Take 180 mg by mouth as needed for allergies or rhinitis.     furosemide (LASIX) 20 MG tablet Take 20 mg by mouth in the morning.     Glucosamine-Chondroit-Vit C-Mn (GLUCOSAMINE CHONDR 500 COMPLEX PO) Take 1 tablet by mouth daily.      ketoconazole (NIZORAL) 2 % cream Apply 1 Application topically 2 (two) times daily as needed (skin rash/irritation.).     levothyroxine (SYNTHROID, LEVOTHROID) 112 MCG tablet Take 112 mcg by mouth daily before  breakfast.      losartan (COZAAR) 25 MG tablet Take 25 mg by mouth in the morning.     metoprolol succinate (TOPROL-XL) 25 MG 24 hr tablet TAKE 1 TABLET (25 MG TOTAL) BY MOUTH DAILY. TAKE WITH OR IMMEDIATELY FOLLOWING A MEAL. 90 tablet 3   nitroGLYCERIN (NITROSTAT) 0.4 MG SL tablet Place 1 tablet (0.4 mg total) under the tongue every 5 (five) minutes as needed for up to 25 days for chest pain. 25 tablet 3   omeprazole (PRILOSEC) 20 MG capsule Take 1 capsule (20 mg total) by mouth 2 (two) times daily before a meal. Twice daily for 1 month.  Then return back to daily dosing. 60 capsule 6   sodium chloride (OCEAN) 0.65 % SOLN nasal spray Place 1 spray into both nostrils as needed for congestion.     No current facility-administered medications for this visit.    PHYSICAL EXAMINATION: ECOG PERFORMANCE STATUS: 2 - Symptomatic, <50% confined to bed  Vitals:   06/12/22 1324  BP: (!) 123/46  Pulse: 69  Resp: 20  Temp: 97.7 F (36.5 C)  SpO2: 100%   Wt Readings from Last 3 Encounters:  06/12/22 230 lb 14.4 oz (104.7 kg)  04/09/22 224 lb 14.4 oz (102 kg)  02/21/22 235 lb 6.4 oz (106.8 kg)     GENERAL:alert, no distress and comfortable SKIN: skin color normal, no rashes or significant lesions EYES: (-) normal, Conjunctiva are pink and non-injected, sclera clear NECK:(-) supple, thyroid normal size, non-tender, without nodularity LYMPH:  (-)no palpable lymphadenopathy in the cervical, axillary  LUNGS:(-) clear to auscultation and percussion with normal breathing effort HEART:(-) regular rate & rhythm and no murmurs and (+)lower extremity edema ABDOMEN:(-)abdomen soft,(-) non-tender and normal bowel sounds  LABORATORY DATA:  I have reviewed the data as listed    Latest Ref Rng & Units 06/12/2022   12:59 PM 04/09/2022   11:32 AM 02/21/2022    8:11 AM  CBC  WBC 4.0 - 10.5 K/uL 4.8  3.4  7.0   Hemoglobin 13.0 - 17.0 g/dL 7.3  9.3  16.1   Hematocrit 39.0 - 52.0 % 22.6  28.7  31.2    Platelets 150 - 400 K/uL 130  98  158         Latest Ref Rng & Units 06/12/2022   12:59 PM 04/09/2022   11:32 AM 02/21/2022    8:11 AM  CMP  Glucose 70 - 99 mg/dL 096  045  98   BUN 8 - 23 mg/dL 16  13  25    Creatinine 0.61 - 1.24 mg/dL  1.27  1.22  1.94   Sodium 135 - 145 mmol/L 140  142  140   Potassium 3.5 - 5.1 mmol/L 4.0  4.0  4.4   Chloride 98 - 111 mmol/L 109  111  108   CO2 22 - 32 mmol/L 25  25  25    Calcium 8.9 - 10.3 mg/dL 8.4  8.4  9.3   Total Protein 6.5 - 8.1 g/dL 6.4  6.8  7.0   Total Bilirubin 0.3 - 1.2 mg/dL 0.5  0.8  0.5   Alkaline Phos 38 - 126 U/L 67  47  57   AST 15 - 41 U/L 21  24  15    ALT 0 - 44 U/L 11  10  10        RADIOGRAPHIC STUDIES: I have personally reviewed the radiological images as listed and agreed with the findings in the report. No results found.    Orders Placed This Encounter  Procedures   Ferritin    Standing Status:   Standing    Number of Occurrences:   20    Standing Expiration Date:   06/12/2023   Vitamin B12    Standing Status:   Standing    Number of Occurrences:   5    Standing Expiration Date:   06/12/2023   Informed Consent Details: Physician/Practitioner Attestation; Transcribe to consent form and obtain patient signature    Order Specific Question:   Physician/Practitioner attestation of informed consent for blood and or blood product transfusion    Answer:   I, the physician/practitioner, attest that I have discussed with the patient the benefits, risks, side effects, alternatives, likelihood of achieving goals and potential problems during recovery for the procedure that I have provided informed consent.    Order Specific Question:   Product(s)    Answer:   All Product(s)   All questions were answered. The patient knows to call the clinic with any problems, questions or concerns. No barriers to learning was detected. The total time spent in the appointment was 25 minutes.     Malachy Mood, MD 06/12/2022   Carolin Coy, CMA, am acting as scribe for Malachy Mood, MD.   I have reviewed the above documentation for accuracy and completeness, and I agree with the above.

## 2022-06-12 NOTE — Telephone Encounter (Signed)
Contacted patient to scheduled appointments. Left message with appointment details and a call back number if patient had any questions or could not accommodate the time we provided.   

## 2022-06-13 ENCOUNTER — Inpatient Hospital Stay: Payer: PPO

## 2022-06-13 ENCOUNTER — Other Ambulatory Visit: Payer: Self-pay

## 2022-06-13 ENCOUNTER — Telehealth: Payer: Self-pay

## 2022-06-13 DIAGNOSIS — K22719 Barrett's esophagus with dysplasia, unspecified: Secondary | ICD-10-CM

## 2022-06-13 DIAGNOSIS — K831 Obstruction of bile duct: Secondary | ICD-10-CM

## 2022-06-13 DIAGNOSIS — D649 Anemia, unspecified: Secondary | ICD-10-CM

## 2022-06-13 DIAGNOSIS — C801 Malignant (primary) neoplasm, unspecified: Secondary | ICD-10-CM

## 2022-06-13 DIAGNOSIS — C241 Malignant neoplasm of ampulla of Vater: Secondary | ICD-10-CM

## 2022-06-13 LAB — TYPE AND SCREEN
ABO/RH(D): A NEG
Antibody Screen: NEGATIVE

## 2022-06-13 LAB — BPAM RBC: Unit Type and Rh: 600

## 2022-06-13 MED ORDER — SODIUM CHLORIDE 0.9% IV SOLUTION
250.0000 mL | Freq: Once | INTRAVENOUS | Status: AC
  Administered 2022-06-13: 250 mL via INTRAVENOUS

## 2022-06-13 NOTE — Telephone Encounter (Signed)
Patient has been scheduled for ERCP on 08/20/22 @ [facility]. Instructions will be sent to patient via my chart and by mailed. Clearance to hold Eliquis x2 days prior to ERCP has been sent to [facility].   Patient has been informed and voiced understanding. Patient aware that I will contact him once we have clearance back from [facility].

## 2022-06-13 NOTE — Patient Instructions (Signed)

## 2022-06-13 NOTE — Telephone Encounter (Signed)
-----   Message from Lemar Lofty., MD sent at 06/13/2022  5:39 AM EDT ----- Carl Woods, This patient's recall just recently came through the night given it to my team to work on scheduling. His plastic stent will still be good for the coming months but we will certainly have him set up for my next available ERCP stent exchange.  [name], I believe this patient is in our recalls that I had recently completed. Please update Dr. Mosetta Putt when scheduled. Thanks. GM ----- Message ----- From: Malachy Mood, MD Sent: 06/12/2022   5:19 PM EDT To: Loretha Stapler, RN; Lemar Lofty., MD  Patty,  I think he is overdue for biliary stent exchange, please check with Dr. Meridee Score, thanks   Terrace Arabia

## 2022-06-14 LAB — CANCER ANTIGEN 19-9: CA 19-9: 15 U/mL (ref 0–35)

## 2022-06-16 ENCOUNTER — Ambulatory Visit: Payer: PPO

## 2022-06-16 ENCOUNTER — Other Ambulatory Visit: Payer: Self-pay

## 2022-06-16 DIAGNOSIS — D519 Vitamin B12 deficiency anemia, unspecified: Secondary | ICD-10-CM | POA: Insufficient documentation

## 2022-06-16 LAB — BPAM RBC
Blood Product Expiration Date: 202406112359
ISSUE DATE / TIME: 202405171303

## 2022-06-16 LAB — TYPE AND SCREEN

## 2022-06-16 NOTE — Progress Notes (Signed)
Orders placed per Result Note message sent from Dr. Mosetta Putt on 06/15/2022:  please help to order IV iron per his insurance preference, and give B12 injections monthly, hope it will improve his anemia, let pt know, thanks   YF   MyChart message sent to pt stating Dr. Mosetta Putt would like for him to B12 injections monthly and 2 doses of IV Feraheme.

## 2022-06-17 ENCOUNTER — Telehealth: Payer: Self-pay | Admitting: Hematology

## 2022-06-17 NOTE — Telephone Encounter (Signed)
Contacted patient to scheduled appointments. Patient is aware of appointments that are scheduled.   

## 2022-06-19 ENCOUNTER — Ambulatory Visit: Payer: PPO | Admitting: Cardiology

## 2022-06-19 ENCOUNTER — Encounter: Payer: Self-pay | Admitting: Cardiology

## 2022-06-19 VITALS — BP 115/54 | HR 65 | Resp 16 | Ht 68.0 in | Wt 225.0 lb

## 2022-06-19 DIAGNOSIS — N1831 Chronic kidney disease, stage 3a: Secondary | ICD-10-CM | POA: Diagnosis not present

## 2022-06-19 DIAGNOSIS — I10 Essential (primary) hypertension: Secondary | ICD-10-CM

## 2022-06-19 DIAGNOSIS — I4821 Permanent atrial fibrillation: Secondary | ICD-10-CM

## 2022-06-19 NOTE — Progress Notes (Signed)
Primary Physician/Referring:  [name], MD  Patient ID: Carl Woods, male    DOB: 10/10/1931, 89 y.o.   MRN: [id number]  Chief Complaint  Patient presents with   Medical Clearance   HPI:    Carl Woods  is a 89 y.o.c  Patient has been scheduled for Endoscopic retrograde cholangiopancreatography Dr. Meridee Score doing the procedure.  Patient presents for his annual visit.  He has no specific complaints from cardiac standpoint.  Stable.  Underwent low risk nuclear stress test in 01/2020, with essentially normal echo at that time as well. 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   Left Forehead (treatment after biopsy)   OSA on CPAP    Perianal rash    Recurrent epistaxis    Renal cyst 01/04/2013   Small left peripelvic renal cysts , noted on US Renal   SCCA (squamous cell carcinoma) of skin 10/17/2015   Left Sup Bridge of Nose (curet, cautery and 5FU)   Seasonal allergies    Superficial basal cell carcinoma (BCC) 10/17/2015   Left Bulb of Nose (curet, cautery and 5FU)   Tubular adenoma    Past Surgical History:  Procedure Laterality Date   BILIARY STENT PLACEMENT N/A 01/23/2022   Procedure: BILIARY STENT PLACEMENT;  Surgeon: Lemar Lofty., MD;  Location: [facility];  Service: Gastroenterology;  Laterality: N/A;   BIOPSY  01/23/2022   Procedure: BIOPSY;  Surgeon: Meridee Score Netty Starring., MD;  Location: [facility];  Service: Gastroenterology;;   COLONOSCOPY  multiple   CYSTOSCOPY     ENDOSCOPIC RETROGRADE CHOLANGIOPANCREATOGRAPHY (ERCP) WITH PROPOFOL N/A 01/23/2022   Procedure: ENDOSCOPIC RETROGRADE CHOLANGIOPANCREATOGRAPHY (ERCP) WITH PROPOFOL;  Surgeon: Lemar Lofty., MD;  Location: [facility];  Service: Gastroenterology;  Laterality: N/A;   ESOPHAGOGASTRODUODENOSCOPY  multiple   ESOPHAGOGASTRODUODENOSCOPY N/A 01/23/2022   Procedure: ESOPHAGOGASTRODUODENOSCOPY (EGD);  Surgeon: Lemar Lofty., MD;  Location: [facility];  Service: Gastroenterology;  Laterality: N/A;   EUS N/A 01/23/2022   Procedure: UPPER ENDOSCOPIC ULTRASOUND (EUS) RADIAL;  Surgeon: Lemar Lofty., MD;  Location: [facility];  Service: Gastroenterology;  Laterality: N/A;   EVALUATION UNDER ANESTHESIA WITH FISTULECTOMY N/A 03/02/2018   Procedure: ANORECTAL EXAM UNDER ANESTHESIA WITH REPAIR OF SUPERFICIAL PERIRECTAL FISTULA AND HEMORRHOIDECTOMY;  Surgeon: [name], MD;  Location: [facility];  Service: General;  Laterality: N/A;   EXPLORATORY LAPAROTOMY     REMOVAL OF STONES  01/23/2022   Procedure: REMOVAL OF STONES;  Surgeon: Lemar Lofty., MD;  Location: [facility];  Service: Gastroenterology;;   Dennison Mascot  01/23/2022   Procedure: Dennison Mascot;  Surgeon: Lemar Lofty., MD;  Location: [facility];  Service: Gastroenterology;;     Social History   Tobacco Use   Smoking status: Former    Packs/day: 0.50    Years: 40.00    Additional pack years: 0.00  Total pack years: 20.00    Types: Cigarettes    Quit date: 2006    Years since quitting: 18.4   Smokeless tobacco: Never  Substance Use Topics   Alcohol use: Yes    Comment: occasional wine   Marital Status: Married  ROS  Review of Systems  Cardiovascular:  Positive for dyspnea on exertion and leg swelling. Negative for chest pain.   Objective      06/19/2022   10:08 AM 06/13/2022    3:02 PM  06/13/2022    1:30 PM  Vitals with BMI  Height 5\' 8"     Weight 225 lbs    BMI 34.22    Systolic 115 111 782  Diastolic 54 51 53  Pulse 65 62 58   SpO2: 95 %  Physical Exam Constitutional:      Appearance: He is obese.  Neck:     Vascular: No carotid bruit or JVD.  Cardiovascular:     Rate and Rhythm: Normal rate. Rhythm irregular.     Pulses:          Dorsalis pedis pulses are 0 on the right side and 0 on the left side.       Posterior tibial pulses are 0 on the right side and 0 on the left side.     Heart sounds: No murmur heard. Pulmonary:     Effort: Pulmonary effort is normal.     Breath sounds: Normal breath sounds.  Abdominal:     General: Bowel sounds are normal.     Palpations: Abdomen is soft.  Musculoskeletal:     Right lower leg: Edema (2-3+ chronic bilateral leg edema with venous stents this changes and thick skin) present.     Left lower leg: Edema (2-3+ chronic bilateral leg edema with venous stents this changes and thick skin) present.  Skin:    Capillary Refill: Capillary refill takes less than 2 seconds.     Laboratory examination:   Recent Labs    02/21/22 0811 04/09/22 1132 06/12/22 1259  NA 140 142 140  K 4.4 4.0 4.0  CL 108 111 109  CO2 25 25 25   GLUCOSE 98 113* 142*  BUN 25* 13 16  CREATININE 1.94* 1.22 1.27*  CALCIUM 9.3 8.4* 8.4*  GFRNONAA 33* 57* 54*    Lab Results  Component Value Date   GLUCOSE 142 (H) 06/12/2022   NA 140 06/12/2022   K 4.0 06/12/2022   CL 109 06/12/2022   CO2 25 06/12/2022   BUN 16 06/12/2022   CREATININE 1.27 (H) 06/12/2022   GFRNONAA 54 (L) 06/12/2022   CALCIUM 8.4 (L) 06/12/2022   PROT 6.4 (L) 06/12/2022   ALBUMIN 3.2 (L) 06/12/2022   BILITOT 0.5 06/12/2022   ALKPHOS 67 06/12/2022   AST 21 06/12/2022   ALT 11 06/12/2022   ANIONGAP 6 06/12/2022      Lab Results  Component Value Date   ALT 11 06/12/2022   AST 21 06/12/2022   ALKPHOS 67 06/12/2022   BILITOT 0.5 06/12/2022       Latest Ref  Rng & Units 06/12/2022   12:59 PM 04/09/2022   11:32 AM 02/21/2022    8:11 AM  Hepatic Function  Total Protein 6.5 - 8.1 g/dL 6.4  6.8  7.0   Albumin 3.5 - 5.0 g/dL 3.2  3.2  3.4   AST 15 - 41 U/L 21  24  15    ALT 0 - 44 U/L 11  10  10  Alk Phosphatase 38 - 126 U/L 67  47  57   Total Bilirubin 0.3 - 1.2 mg/dL 0.5  0.8  0.5       Latest Ref Rng & Units 06/12/2022   12:59 PM 04/09/2022   11:32 AM 02/21/2022    8:11 AM  CBC  WBC 4.0 - 10.5 K/uL 4.8  3.4  7.0   Hemoglobin 13.0 - 17.0 g/dL 7.3  9.3  16.1   Hematocrit 39.0 - 52.0 % 22.6  28.7  31.2   Platelets 150 - 400 K/uL 130  98  158     External labs:   Cholesterol, total 92.000 mg 09/26/2020 HDL 31.000 mg 09/26/2020 LDL 46.000 MG 05/22/2020 Triglycerides 70.000 mg 08/13/2021  A1C 6.500 % 08/13/2021  Radiology:    Cardiac Studies:   PCV MYOCARDIAL PERFUSION WITH LEXISCAN 02/08/2020  Narrative Lexiscan Tetrofosmin stress test 02/08/2020: Eugenie Birks nuclear stress test performed using 1-day protocol. Normal myocardial perfusion. Stress LVEF 76%. Low risk study.  PCV ECHOCARDIOGRAM COMPLETE 06/18/2021  Narrative Echocardiogram 06/18/2021: Normal LV systolic function with visual EF 55-60%. Left ventricle cavity is normal in size. Normal left ventricular wall thickness. Normal global wall motion. Unable to evaluate diastolic function due to atrial fibrillation. Trace aortic regurgitation. Mild tricuspid regurgitation. No evidence of pulmonary hypertension. RVSP measures 33 mmHg. Compared to 02/06/2020 small pericardial effusion has resolved otherwise no significant change.  EKG:   EKG 06/19/2022: Atrial fibrillation with controlled ventricular response of heart of 62 bpm, normal axis, incomplete right bundle branch block.  Low-voltage complexes.  Nonspecific T abnormality.  Compared to 06/03/2021, no significant change.   Medications and allergies  No Known Allergies   Medication list   Current Outpatient Medications:     apixaban (ELIQUIS) 5 MG TABS tablet, Take 1 tablet (5 mg total) by mouth 2 (two) times daily., Disp: 60 tablet, Rfl:    Cholecalciferol (VITAMIN D3) 5000 units CAPS, Take 1 capsule by mouth every evening., Disp: , Rfl:    dapagliflozin propanediol (FARXIGA) 10 MG TABS tablet, Take 10 mg by mouth in the morning., Disp: , Rfl:    Fe Fum-Fe Poly-Vit C-Lactobac (FUSION) 65-65-25-30 MG CAPS, Take 1 capsule by mouth 3 (three) times a week., Disp: , Rfl:    fexofenadine (ALLEGRA) 180 MG tablet, Take 180 mg by mouth as needed for allergies or rhinitis., Disp: , Rfl:    furosemide (LASIX) 20 MG tablet, Take 20 mg by mouth in the morning., Disp: , Rfl:    Glucosamine Sulfate 500 MG TABS, Take by mouth., Disp: , Rfl:    Glucosamine-Chondroit-Vit C-Mn (GLUCOSAMINE CHONDR 500 COMPLEX PO), Take 1 tablet by mouth daily. , Disp: , Rfl:    ketoconazole (NIZORAL) 2 % cream, Apply 1 Application topically 2 (two) times daily as needed (skin rash/irritation.)., Disp: , Rfl:    levothyroxine (SYNTHROID, LEVOTHROID) 112 MCG tablet, Take 112 mcg by mouth daily before breakfast. , Disp: , Rfl:    losartan (COZAAR) 25 MG tablet, Take 25 mg by mouth in the morning., Disp: , Rfl:    metoprolol succinate (TOPROL-XL) 25 MG 24 hr tablet, TAKE 1 TABLET (25 MG TOTAL) BY MOUTH DAILY. TAKE WITH OR IMMEDIATELY FOLLOWING A MEAL., Disp: 90 tablet, Rfl: 3   nitroGLYCERIN (NITROSTAT) 0.4 MG SL tablet, Place 1 tablet (0.4 mg total) under the tongue every 5 (five) minutes as needed for up to 25 days for chest pain., Disp: 25 tablet, Rfl: 3   olopatadine (PATANOL) 0.1 % ophthalmic solution, Place 1  drop into both eyes 2 (two) times daily., Disp: , Rfl:    omeprazole (PRILOSEC) 20 MG capsule, Take 1 capsule (20 mg total) by mouth 2 (two) times daily before a meal. Twice daily for 1 month.  Then return back to daily dosing., Disp: 60 capsule, Rfl: 6   sodium chloride (OCEAN) 0.65 % SOLN nasal spray, Place 1 spray into both nostrils as needed for  congestion., Disp: , Rfl:   Assessment     ICD-10-CM   1. Permanent atrial fibrillation (HCC)  I48.21 EKG 12-Lead    2. Primary hypertension  I10     3. Stage 3a chronic kidney disease (HCC)  N18.31        Orders Placed This Encounter  Procedures   EKG 12-Lead    No orders of the defined types were placed in this encounter.   There are no discontinued medications.    CHA2DS2-VASc Score is 4.  Yearly risk of stroke: 5% (A, HTN, DM).  Score of 1=0.6; 2=2.2; 3=3.2; 4=4.8; 5=7.2; 6=9.8; 7=>9.8) -(CHF; HTN; vasc disease DM,  Male = 1; Age <65 =0; 65-74 = 1,  >75 =2; stroke/embolism= 2).   Recommendations:   Carl Woods is a 89 y.o.  Caucasian male with permanent atrial fibrillation, obstructive sleep apnea on CPAP and compliant, hypertension, diabetes mellitus with stage IIIa chronic kidney disease, mild centrilobular emphysema,  presents here for annual visit for hypertension and atrial fibrillation follow-up.  Patient has been scheduled for Endoscopic retrograde cholangiopancreatography Dr. Meridee Score doing the procedure.  1. Permanent atrial fibrillation (HCC) With permanent atrial fibrillation, new diagnosis of ampule of Vater cancer, presently undergoing radiation therapy.  He is now been scheduled for ERCP, which I do not think that is a contraindication from cardiac standpoint, Eliquis can be held for 48 hours prior to the procedure and restart postprocedure when stable in 24 to 48 hours.  Patient has developed severe anemia and thrombocytopenia and given his advanced age, renal failure, he would certainly be at high risk for bleeding as well.  If we need to permanently discontinue Eliquis, his chads vascular score being between 3-4 in view of age, hypertension and plus or minus diabetes mellitus, we could certainly do this however would like to wait and watch and see if he has any obvious bleeding source or the root cause for his anemia is.  He is being closely followed by GI.   I will forward this note to Dr. Meridee Score. - EKG 12-Lead  2. Primary hypertension Blood pressure is under excellent control.  3. Stage 3a chronic kidney disease (HCC) Renal function has remained stable.  He has had a rocky course since December 2023 since the diagnosis of cancer, he has had acute renal insufficiency at times as well.  Main issue continues to be severe anemia.  Office visit in 3 months to close the gap.  Usually I see him once a year.   Carl Decamp, MD, [facility] 06/19/2022, 10:24 AM Office: [phone]

## 2022-06-19 NOTE — Progress Notes (Signed)
I sent a note to Dr. Meridee Score, low risk to proceed, hold Eliquis for 48 hours, restart Eliquis the following day after the procedure unless there is complication, hold for additional 3 to 4 days.

## 2022-06-20 ENCOUNTER — Other Ambulatory Visit: Payer: Self-pay

## 2022-06-20 ENCOUNTER — Inpatient Hospital Stay: Payer: PPO

## 2022-06-20 ENCOUNTER — Telehealth: Payer: Self-pay

## 2022-06-20 VITALS — BP 104/46 | HR 76 | Temp 98.1°F | Resp 17

## 2022-06-20 DIAGNOSIS — D519 Vitamin B12 deficiency anemia, unspecified: Secondary | ICD-10-CM

## 2022-06-20 DIAGNOSIS — D649 Anemia, unspecified: Secondary | ICD-10-CM | POA: Diagnosis not present

## 2022-06-20 MED ORDER — CYANOCOBALAMIN 1000 MCG/ML IJ SOLN
1000.0000 ug | Freq: Once | INTRAMUSCULAR | Status: AC
  Administered 2022-06-20: 1000 ug via INTRAMUSCULAR
  Filled 2022-06-20: qty 1

## 2022-06-20 NOTE — Patient Instructions (Signed)

## 2022-06-20 NOTE — Telephone Encounter (Signed)
Per Dr Jacinto Halim -Eliquis can be held for 48 hours prior to the procedure and restart postprocedure when stable in 24 to 48 hours. Patient has been informed. Voiced understanding.

## 2022-06-27 MED FILL — Ferumoxytol Inj 510 MG/17ML (30 MG/ML) (Elemental Fe): INTRAVENOUS | Qty: 17 | Status: AC

## 2022-06-28 ENCOUNTER — Inpatient Hospital Stay: Payer: PPO | Attending: Physician Assistant

## 2022-06-28 VITALS — BP 100/35 | HR 65 | Temp 97.6°F | Resp 20

## 2022-06-28 DIAGNOSIS — D519 Vitamin B12 deficiency anemia, unspecified: Secondary | ICD-10-CM | POA: Diagnosis not present

## 2022-06-28 DIAGNOSIS — D500 Iron deficiency anemia secondary to blood loss (chronic): Secondary | ICD-10-CM

## 2022-06-28 DIAGNOSIS — C241 Malignant neoplasm of ampulla of Vater: Secondary | ICD-10-CM | POA: Diagnosis not present

## 2022-06-28 MED ORDER — SODIUM CHLORIDE 0.9 % IV SOLN: Freq: Once | INTRAVENOUS | Status: AC

## 2022-06-28 MED ORDER — ACETAMINOPHEN 325 MG PO TABS
650.0000 mg | ORAL_TABLET | Freq: Once | ORAL | Status: AC
  Administered 2022-06-28: 650 mg via ORAL
  Filled 2022-06-28: qty 2

## 2022-06-28 MED ORDER — LORATADINE 10 MG PO TABS
10.0000 mg | ORAL_TABLET | Freq: Once | ORAL | Status: AC
  Administered 2022-06-28: 10 mg via ORAL
  Filled 2022-06-28: qty 1

## 2022-06-28 MED ORDER — SODIUM CHLORIDE 0.9 % IV SOLN
510.0000 mg | Freq: Once | INTRAVENOUS | Status: AC
  Administered 2022-06-28: 510 mg via INTRAVENOUS
  Filled 2022-06-28: qty 17

## 2022-06-28 NOTE — Progress Notes (Signed)
Per Dr. Mosetta Putt OK to discharge patient if systolic is 100 or greater. Patient stable at discharge.

## 2022-06-28 NOTE — Patient Instructions (Signed)

## 2022-07-02 ENCOUNTER — Telehealth: Payer: Self-pay | Admitting: Hematology

## 2022-07-02 NOTE — Telephone Encounter (Signed)
Contacted patient to scheduled appointments. Patient is aware of appointments that are scheduled.   

## 2022-07-05 ENCOUNTER — Inpatient Hospital Stay: Payer: PPO

## 2022-07-05 VITALS — BP 110/82 | HR 64 | Temp 97.5°F | Resp 16

## 2022-07-05 DIAGNOSIS — D500 Iron deficiency anemia secondary to blood loss (chronic): Secondary | ICD-10-CM

## 2022-07-05 DIAGNOSIS — R079 Chest pain, unspecified: Secondary | ICD-10-CM | POA: Diagnosis not present

## 2022-07-05 DIAGNOSIS — K922 Gastrointestinal hemorrhage, unspecified: Secondary | ICD-10-CM | POA: Diagnosis not present

## 2022-07-05 DIAGNOSIS — D649 Anemia, unspecified: Secondary | ICD-10-CM | POA: Diagnosis not present

## 2022-07-05 DIAGNOSIS — R0789 Other chest pain: Secondary | ICD-10-CM | POA: Diagnosis not present

## 2022-07-05 DIAGNOSIS — C241 Malignant neoplasm of ampulla of Vater: Secondary | ICD-10-CM

## 2022-07-05 DIAGNOSIS — J90 Pleural effusion, not elsewhere classified: Secondary | ICD-10-CM | POA: Diagnosis not present

## 2022-07-05 DIAGNOSIS — K254 Chronic or unspecified gastric ulcer with hemorrhage: Secondary | ICD-10-CM | POA: Diagnosis not present

## 2022-07-05 MED ORDER — SODIUM CHLORIDE 0.9 % IV SOLN: Freq: Once | INTRAVENOUS | Status: AC

## 2022-07-05 MED ORDER — LORATADINE 10 MG PO TABS
10.0000 mg | ORAL_TABLET | Freq: Once | ORAL | Status: AC
  Administered 2022-07-05: 10 mg via ORAL
  Filled 2022-07-05: qty 1

## 2022-07-05 MED ORDER — ACETAMINOPHEN 325 MG PO TABS
650.0000 mg | ORAL_TABLET | Freq: Once | ORAL | Status: AC
  Administered 2022-07-05: 650 mg via ORAL
  Filled 2022-07-05: qty 2

## 2022-07-05 MED ORDER — SODIUM CHLORIDE 0.9 % IV SOLN
510.0000 mg | Freq: Once | INTRAVENOUS | Status: AC
  Administered 2022-07-05: 510 mg via INTRAVENOUS
  Filled 2022-07-05: qty 510

## 2022-07-05 NOTE — Patient Instructions (Signed)

## 2022-07-06 ENCOUNTER — Emergency Department (HOSPITAL_COMMUNITY): Payer: PPO

## 2022-07-06 ENCOUNTER — Telehealth: Payer: Self-pay | Admitting: Gastroenterology

## 2022-07-06 ENCOUNTER — Inpatient Hospital Stay (HOSPITAL_COMMUNITY)
Admission: EM | Admit: 2022-07-06 | Discharge: 2022-07-11 | DRG: 377 | Disposition: A | Discharge status: Skilled Nursing Facility | Payer: PPO | Attending: Internal Medicine | Admitting: Internal Medicine

## 2022-07-06 ENCOUNTER — Encounter (HOSPITAL_COMMUNITY): Payer: Self-pay

## 2022-07-06 ENCOUNTER — Inpatient Hospital Stay (HOSPITAL_COMMUNITY): Payer: PPO

## 2022-07-06 ENCOUNTER — Other Ambulatory Visit: Payer: Self-pay

## 2022-07-06 DIAGNOSIS — R0602 Shortness of breath: Secondary | ICD-10-CM

## 2022-07-06 DIAGNOSIS — J439 Emphysema, unspecified: Secondary | ICD-10-CM | POA: Diagnosis present

## 2022-07-06 DIAGNOSIS — D500 Iron deficiency anemia secondary to blood loss (chronic): Secondary | ICD-10-CM | POA: Diagnosis not present

## 2022-07-06 DIAGNOSIS — D649 Anemia, unspecified: Secondary | ICD-10-CM | POA: Diagnosis present

## 2022-07-06 DIAGNOSIS — N281 Cyst of kidney, acquired: Secondary | ICD-10-CM | POA: Diagnosis not present

## 2022-07-06 DIAGNOSIS — E669 Obesity, unspecified: Secondary | ICD-10-CM | POA: Diagnosis present

## 2022-07-06 DIAGNOSIS — R1084 Generalized abdominal pain: Secondary | ICD-10-CM | POA: Diagnosis not present

## 2022-07-06 DIAGNOSIS — C241 Malignant neoplasm of ampulla of Vater: Secondary | ICD-10-CM | POA: Diagnosis not present

## 2022-07-06 DIAGNOSIS — G4733 Obstructive sleep apnea (adult) (pediatric): Secondary | ICD-10-CM | POA: Diagnosis present

## 2022-07-06 DIAGNOSIS — K2990 Gastroduodenitis, unspecified, without bleeding: Secondary | ICD-10-CM | POA: Diagnosis present

## 2022-07-06 DIAGNOSIS — Z79890 Hormone replacement therapy (postmenopausal): Secondary | ICD-10-CM

## 2022-07-06 DIAGNOSIS — K766 Portal hypertension: Secondary | ICD-10-CM | POA: Diagnosis present

## 2022-07-06 DIAGNOSIS — E039 Hypothyroidism, unspecified: Secondary | ICD-10-CM | POA: Insufficient documentation

## 2022-07-06 DIAGNOSIS — I4891 Unspecified atrial fibrillation: Secondary | ICD-10-CM | POA: Diagnosis not present

## 2022-07-06 DIAGNOSIS — I10 Essential (primary) hypertension: Secondary | ICD-10-CM

## 2022-07-06 DIAGNOSIS — K922 Gastrointestinal hemorrhage, unspecified: Secondary | ICD-10-CM | POA: Diagnosis not present

## 2022-07-06 DIAGNOSIS — D631 Anemia in chronic kidney disease: Secondary | ICD-10-CM | POA: Diagnosis present

## 2022-07-06 DIAGNOSIS — R079 Chest pain, unspecified: Secondary | ICD-10-CM | POA: Diagnosis not present

## 2022-07-06 DIAGNOSIS — J9811 Atelectasis: Secondary | ICD-10-CM | POA: Diagnosis not present

## 2022-07-06 DIAGNOSIS — D539 Nutritional anemia, unspecified: Secondary | ICD-10-CM | POA: Diagnosis present

## 2022-07-06 DIAGNOSIS — I499 Cardiac arrhythmia, unspecified: Secondary | ICD-10-CM | POA: Diagnosis not present

## 2022-07-06 DIAGNOSIS — Z6832 Body mass index (BMI) 32.0-32.9, adult: Secondary | ICD-10-CM

## 2022-07-06 DIAGNOSIS — K3189 Other diseases of stomach and duodenum: Secondary | ICD-10-CM | POA: Diagnosis not present

## 2022-07-06 DIAGNOSIS — K7460 Unspecified cirrhosis of liver: Secondary | ICD-10-CM | POA: Diagnosis present

## 2022-07-06 DIAGNOSIS — R42 Dizziness and giddiness: Secondary | ICD-10-CM | POA: Diagnosis not present

## 2022-07-06 DIAGNOSIS — E876 Hypokalemia: Secondary | ICD-10-CM | POA: Diagnosis not present

## 2022-07-06 DIAGNOSIS — J90 Pleural effusion, not elsewhere classified: Secondary | ICD-10-CM | POA: Insufficient documentation

## 2022-07-06 DIAGNOSIS — K6389 Other specified diseases of intestine: Secondary | ICD-10-CM | POA: Diagnosis not present

## 2022-07-06 DIAGNOSIS — M19041 Primary osteoarthritis, right hand: Secondary | ICD-10-CM | POA: Diagnosis present

## 2022-07-06 DIAGNOSIS — Z7901 Long term (current) use of anticoagulants: Secondary | ICD-10-CM | POA: Diagnosis not present

## 2022-07-06 DIAGNOSIS — K254 Chronic or unspecified gastric ulcer with hemorrhage: Secondary | ICD-10-CM | POA: Diagnosis not present

## 2022-07-06 DIAGNOSIS — J918 Pleural effusion in other conditions classified elsewhere: Secondary | ICD-10-CM | POA: Diagnosis not present

## 2022-07-06 DIAGNOSIS — I259 Chronic ischemic heart disease, unspecified: Secondary | ICD-10-CM | POA: Diagnosis not present

## 2022-07-06 DIAGNOSIS — Z8509 Personal history of malignant neoplasm of other digestive organs: Secondary | ICD-10-CM

## 2022-07-06 DIAGNOSIS — Z7401 Bed confinement status: Secondary | ICD-10-CM | POA: Diagnosis not present

## 2022-07-06 DIAGNOSIS — F0390 Unspecified dementia without behavioral disturbance: Secondary | ICD-10-CM | POA: Diagnosis not present

## 2022-07-06 DIAGNOSIS — Z86010 Personal history of colonic polyps: Secondary | ICD-10-CM

## 2022-07-06 DIAGNOSIS — I130 Hypertensive heart and chronic kidney disease with heart failure and stage 1 through stage 4 chronic kidney disease, or unspecified chronic kidney disease: Secondary | ICD-10-CM | POA: Diagnosis not present

## 2022-07-06 DIAGNOSIS — R7989 Other specified abnormal findings of blood chemistry: Secondary | ICD-10-CM | POA: Diagnosis not present

## 2022-07-06 DIAGNOSIS — K760 Fatty (change of) liver, not elsewhere classified: Secondary | ICD-10-CM | POA: Diagnosis present

## 2022-07-06 DIAGNOSIS — I272 Pulmonary hypertension, unspecified: Secondary | ICD-10-CM | POA: Diagnosis present

## 2022-07-06 DIAGNOSIS — E8770 Fluid overload, unspecified: Secondary | ICD-10-CM | POA: Diagnosis not present

## 2022-07-06 DIAGNOSIS — Z8249 Family history of ischemic heart disease and other diseases of the circulatory system: Secondary | ICD-10-CM

## 2022-07-06 DIAGNOSIS — Z8619 Personal history of other infectious and parasitic diseases: Secondary | ICD-10-CM

## 2022-07-06 DIAGNOSIS — D62 Acute posthemorrhagic anemia: Secondary | ICD-10-CM

## 2022-07-06 DIAGNOSIS — E1122 Type 2 diabetes mellitus with diabetic chronic kidney disease: Secondary | ICD-10-CM | POA: Diagnosis present

## 2022-07-06 DIAGNOSIS — T183XXA Foreign body in small intestine, initial encounter: Secondary | ICD-10-CM | POA: Diagnosis not present

## 2022-07-06 DIAGNOSIS — R4182 Altered mental status, unspecified: Secondary | ICD-10-CM | POA: Diagnosis not present

## 2022-07-06 DIAGNOSIS — J342 Deviated nasal septum: Secondary | ICD-10-CM | POA: Diagnosis present

## 2022-07-06 DIAGNOSIS — R0789 Other chest pain: Secondary | ICD-10-CM | POA: Diagnosis not present

## 2022-07-06 DIAGNOSIS — Z87891 Personal history of nicotine dependence: Secondary | ICD-10-CM | POA: Diagnosis not present

## 2022-07-06 DIAGNOSIS — I209 Angina pectoris, unspecified: Secondary | ICD-10-CM

## 2022-07-06 DIAGNOSIS — I214 Non-ST elevation (NSTEMI) myocardial infarction: Secondary | ICD-10-CM | POA: Insufficient documentation

## 2022-07-06 DIAGNOSIS — K921 Melena: Secondary | ICD-10-CM | POA: Diagnosis not present

## 2022-07-06 DIAGNOSIS — Z79899 Other long term (current) drug therapy: Secondary | ICD-10-CM

## 2022-07-06 DIAGNOSIS — K219 Gastro-esophageal reflux disease without esophagitis: Secondary | ICD-10-CM | POA: Insufficient documentation

## 2022-07-06 DIAGNOSIS — Z7984 Long term (current) use of oral hypoglycemic drugs: Secondary | ICD-10-CM

## 2022-07-06 DIAGNOSIS — K317 Polyp of stomach and duodenum: Secondary | ICD-10-CM | POA: Diagnosis not present

## 2022-07-06 DIAGNOSIS — I959 Hypotension, unspecified: Secondary | ICD-10-CM | POA: Diagnosis not present

## 2022-07-06 DIAGNOSIS — D509 Iron deficiency anemia, unspecified: Secondary | ICD-10-CM | POA: Diagnosis present

## 2022-07-06 DIAGNOSIS — I252 Old myocardial infarction: Secondary | ICD-10-CM | POA: Diagnosis not present

## 2022-07-06 DIAGNOSIS — Z7189 Other specified counseling: Secondary | ICD-10-CM | POA: Diagnosis not present

## 2022-07-06 DIAGNOSIS — I4821 Permanent atrial fibrillation: Secondary | ICD-10-CM | POA: Diagnosis not present

## 2022-07-06 DIAGNOSIS — I5031 Acute diastolic (congestive) heart failure: Secondary | ICD-10-CM | POA: Diagnosis not present

## 2022-07-06 DIAGNOSIS — N1832 Chronic kidney disease, stage 3b: Secondary | ICD-10-CM | POA: Diagnosis not present

## 2022-07-06 DIAGNOSIS — I21A1 Myocardial infarction type 2: Secondary | ICD-10-CM | POA: Diagnosis present

## 2022-07-06 DIAGNOSIS — I469 Cardiac arrest, cause unspecified: Secondary | ICD-10-CM | POA: Diagnosis not present

## 2022-07-06 DIAGNOSIS — D131 Benign neoplasm of stomach: Secondary | ICD-10-CM | POA: Diagnosis present

## 2022-07-06 DIAGNOSIS — I5030 Unspecified diastolic (congestive) heart failure: Secondary | ICD-10-CM | POA: Diagnosis not present

## 2022-07-06 DIAGNOSIS — K259 Gastric ulcer, unspecified as acute or chronic, without hemorrhage or perforation: Secondary | ICD-10-CM

## 2022-07-06 DIAGNOSIS — M19042 Primary osteoarthritis, left hand: Secondary | ICD-10-CM | POA: Diagnosis present

## 2022-07-06 DIAGNOSIS — Z01810 Encounter for preprocedural cardiovascular examination: Secondary | ICD-10-CM | POA: Diagnosis not present

## 2022-07-06 DIAGNOSIS — N400 Enlarged prostate without lower urinary tract symptoms: Secondary | ICD-10-CM | POA: Diagnosis present

## 2022-07-06 DIAGNOSIS — N179 Acute kidney failure, unspecified: Secondary | ICD-10-CM

## 2022-07-06 DIAGNOSIS — K2270 Barrett's esophagus without dysplasia: Secondary | ICD-10-CM | POA: Diagnosis present

## 2022-07-06 DIAGNOSIS — Z9989 Dependence on other enabling machines and devices: Secondary | ICD-10-CM | POA: Diagnosis not present

## 2022-07-06 DIAGNOSIS — Z85828 Personal history of other malignant neoplasm of skin: Secondary | ICD-10-CM

## 2022-07-06 DIAGNOSIS — K2970 Gastritis, unspecified, without bleeding: Secondary | ICD-10-CM | POA: Diagnosis not present

## 2022-07-06 DIAGNOSIS — R1013 Epigastric pain: Secondary | ICD-10-CM | POA: Diagnosis not present

## 2022-07-06 DIAGNOSIS — D72829 Elevated white blood cell count, unspecified: Secondary | ICD-10-CM | POA: Diagnosis present

## 2022-07-06 DIAGNOSIS — R609 Edema, unspecified: Secondary | ICD-10-CM | POA: Diagnosis not present

## 2022-07-06 DIAGNOSIS — Z515 Encounter for palliative care: Secondary | ICD-10-CM | POA: Diagnosis not present

## 2022-07-06 DIAGNOSIS — Z923 Personal history of irradiation: Secondary | ICD-10-CM

## 2022-07-06 LAB — CBC WITH DIFFERENTIAL/PLATELET
Abs Immature Granulocytes: 0.05 10*3/uL (ref 0.00–0.07)
Basophils Absolute: 0 10*3/uL (ref 0.0–0.1)
Basophils Relative: 0 %
Eosinophils Absolute: 0 10*3/uL (ref 0.0–0.5)
Eosinophils Relative: 0 %
HCT: 22.1 % — ABNORMAL LOW (ref 39.0–52.0)
Hemoglobin: 7 g/dL — ABNORMAL LOW (ref 13.0–17.0)
Immature Granulocytes: 1 %
Lymphocytes Relative: 10 %
Lymphs Abs: 1.1 10*3/uL (ref 0.7–4.0)
MCH: 34 pg (ref 26.0–34.0)
MCHC: 31.7 g/dL (ref 30.0–36.0)
MCV: 107.3 fL — ABNORMAL HIGH (ref 80.0–100.0)
Monocytes Absolute: 0.6 10*3/uL (ref 0.1–1.0)
Monocytes Relative: 5 %
Neutro Abs: 9.1 10*3/uL — ABNORMAL HIGH (ref 1.7–7.7)
Neutrophils Relative %: 84 %
Platelets: 93 10*3/uL — ABNORMAL LOW (ref 150–400)
RBC: 2.06 MIL/uL — ABNORMAL LOW (ref 4.22–5.81)
RDW: 27.9 % — ABNORMAL HIGH (ref 11.5–15.5)
WBC: 10.8 10*3/uL — ABNORMAL HIGH (ref 4.0–10.5)
nRBC: 1 % — ABNORMAL HIGH (ref 0.0–0.2)

## 2022-07-06 LAB — COMPREHENSIVE METABOLIC PANEL
ALT: 14 U/L (ref 0–44)
AST: 28 U/L (ref 15–41)
Albumin: 2.7 g/dL — ABNORMAL LOW (ref 3.5–5.0)
Alkaline Phosphatase: 54 U/L (ref 38–126)
Anion gap: 11 (ref 5–15)
BUN: 28 mg/dL — ABNORMAL HIGH (ref 8–23)
CO2: 20 mmol/L — ABNORMAL LOW (ref 22–32)
Calcium: 8.6 mg/dL — ABNORMAL LOW (ref 8.9–10.3)
Chloride: 108 mmol/L (ref 98–111)
Creatinine, Ser: 1.74 mg/dL — ABNORMAL HIGH (ref 0.61–1.24)
GFR, Estimated: 37 mL/min — ABNORMAL LOW (ref >60)
Glucose, Bld: 135 mg/dL — ABNORMAL HIGH (ref 70–99)
Potassium: 3.4 mmol/L — ABNORMAL LOW (ref 3.5–5.1)
Sodium: 139 mmol/L (ref 135–145)
Total Bilirubin: 1.4 mg/dL — ABNORMAL HIGH (ref 0.3–1.2)
Total Protein: 5.8 g/dL — ABNORMAL LOW (ref 6.5–8.1)

## 2022-07-06 LAB — PROTIME-INR
INR: 1.6 — ABNORMAL HIGH (ref 0.8–1.2)
INR: 1.5 — ABNORMAL HIGH (ref 0.8–1.2)
INR: 1.4 — ABNORMAL HIGH (ref 0.8–1.2)
Prothrombin Time: 19.1 seconds — ABNORMAL HIGH (ref 11.4–15.2)
Prothrombin Time: 18.5 seconds — ABNORMAL HIGH (ref 11.4–15.2)
Prothrombin Time: 17.6 seconds — ABNORMAL HIGH (ref 11.4–15.2)

## 2022-07-06 LAB — CBC
HCT: 16.4 % — ABNORMAL LOW (ref 39.0–52.0)
Hemoglobin: 5 g/dL — CL (ref 13.0–17.0)
MCH: 38.2 pg — ABNORMAL HIGH (ref 26.0–34.0)
MCHC: 30.5 g/dL (ref 30.0–36.0)
MCV: 125.2 fL — ABNORMAL HIGH (ref 80.0–100.0)
Platelets: 106 10*3/uL — ABNORMAL LOW (ref 150–400)
RBC: 1.31 MIL/uL — ABNORMAL LOW (ref 4.22–5.81)
RDW: 24.4 % — ABNORMAL HIGH (ref 11.5–15.5)
WBC: 12.3 10*3/uL — ABNORMAL HIGH (ref 4.0–10.5)
nRBC: 1.1 % — ABNORMAL HIGH (ref 0.0–0.2)

## 2022-07-06 LAB — RETICULOCYTES
Immature Retic Fract: 22.9 % — ABNORMAL HIGH (ref 2.3–15.9)
RBC.: 2.01 MIL/uL — ABNORMAL LOW (ref 4.22–5.81)
Retic Count, Absolute: 139.9 10*3/uL (ref 19.0–186.0)
Retic Ct Pct: 7 % — ABNORMAL HIGH (ref 0.4–3.1)

## 2022-07-06 LAB — IRON AND TIBC
Iron: 216 ug/dL — ABNORMAL HIGH (ref 45–182)
Saturation Ratios: 63 % — ABNORMAL HIGH (ref 17.9–39.5)
TIBC: 346 ug/dL (ref 250–450)
UIBC: 130 ug/dL

## 2022-07-06 LAB — POC OCCULT BLOOD, ED: Fecal Occult Bld: NEGATIVE

## 2022-07-06 LAB — BPAM RBC
Blood Product Expiration Date: 202406132359
Unit Type and Rh: 600

## 2022-07-06 LAB — TYPE AND SCREEN: ABO/RH(D): A NEG

## 2022-07-06 LAB — BASIC METABOLIC PANEL
Anion gap: 8 (ref 5–15)
BUN: 28 mg/dL — ABNORMAL HIGH (ref 8–23)
CO2: 18 mmol/L — ABNORMAL LOW (ref 22–32)
Calcium: 8.7 mg/dL — ABNORMAL LOW (ref 8.9–10.3)
Chloride: 111 mmol/L (ref 98–111)
Creatinine, Ser: 1.5 mg/dL — ABNORMAL HIGH (ref 0.61–1.24)
GFR, Estimated: 44 mL/min — ABNORMAL LOW (ref >60)
Glucose, Bld: 133 mg/dL — ABNORMAL HIGH (ref 70–99)
Potassium: 3.4 mmol/L — ABNORMAL LOW (ref 3.5–5.1)
Sodium: 137 mmol/L (ref 135–145)

## 2022-07-06 LAB — PREPARE RBC (CROSSMATCH)

## 2022-07-06 LAB — TROPONIN I (HIGH SENSITIVITY)
Troponin I (High Sensitivity): 138 ng/L (ref <18)
Troponin I (High Sensitivity): 247 ng/L (ref <18)

## 2022-07-06 LAB — MAGNESIUM
Magnesium: 1.5 mg/dL — ABNORMAL LOW (ref 1.7–2.4)
Magnesium: 1.6 mg/dL — ABNORMAL LOW (ref 1.7–2.4)

## 2022-07-06 LAB — FIBRINOGEN: Fibrinogen: 423 mg/dL (ref 210–475)

## 2022-07-06 LAB — VITAMIN B12: Vitamin B-12: 513 pg/mL (ref 180–914)

## 2022-07-06 LAB — FERRITIN: Ferritin: 172 ng/mL (ref 24–336)

## 2022-07-06 LAB — BRAIN NATRIURETIC PEPTIDE: B Natriuretic Peptide: 413.5 pg/mL — ABNORMAL HIGH (ref 0.0–100.0)

## 2022-07-06 LAB — FOLATE: Folate: 7.3 ng/mL (ref >5.9)

## 2022-07-06 MED ORDER — SODIUM CHLORIDE 0.9% FLUSH
3.0000 mL | Freq: Two times a day (BID) | INTRAVENOUS | Status: DC
  Administered 2022-07-06 – 2022-07-11 (×10): 3 mL via INTRAVENOUS

## 2022-07-06 MED ORDER — SODIUM CHLORIDE 0.9% IV SOLUTION: Freq: Once | INTRAVENOUS | Status: AC

## 2022-07-06 MED ORDER — BUMETANIDE 0.25 MG/ML IJ SOLN
1.0000 mg | Freq: Two times a day (BID) | INTRAMUSCULAR | Status: DC
  Administered 2022-07-06 – 2022-07-08 (×6): 1 mg via INTRAVENOUS
  Filled 2022-07-06 (×9): qty 4

## 2022-07-06 MED ORDER — MAGNESIUM SULFATE 2 GM/50ML IV SOLN
2.0000 g | Freq: Once | INTRAVENOUS | Status: AC
  Administered 2022-07-06: 2 g via INTRAVENOUS
  Filled 2022-07-06: qty 50

## 2022-07-06 MED ORDER — LEVOTHYROXINE SODIUM 112 MCG PO TABS
112.0000 ug | ORAL_TABLET | Freq: Every day | ORAL | Status: DC
  Administered 2022-07-07 – 2022-07-11 (×5): 112 ug via ORAL
  Filled 2022-07-06 (×5): qty 1

## 2022-07-06 MED ORDER — PANTOPRAZOLE INFUSION (NEW) - SIMPLE MED
8.0000 mg/h | INTRAVENOUS | Status: DC
  Administered 2022-07-06 – 2022-07-09 (×7): 8 mg/h via INTRAVENOUS
  Filled 2022-07-06 (×8): qty 100

## 2022-07-06 MED ORDER — POTASSIUM CHLORIDE CRYS ER 20 MEQ PO TBCR
40.0000 meq | EXTENDED_RELEASE_TABLET | ORAL | Status: AC
  Administered 2022-07-06: 40 meq via ORAL
  Filled 2022-07-06: qty 2

## 2022-07-06 MED ORDER — ACETAMINOPHEN 325 MG PO TABS: 650.0000 mg | ORAL_TABLET | Freq: Four times a day (QID) | ORAL | Status: DC | PRN

## 2022-07-06 MED ORDER — PANTOPRAZOLE 80MG IVPB - SIMPLE MED
80.0000 mg | Freq: Once | INTRAVENOUS | Status: AC
  Administered 2022-07-06: 80 mg via INTRAVENOUS
  Filled 2022-07-06: qty 100

## 2022-07-06 MED ORDER — ACETAMINOPHEN 650 MG RE SUPP: 650.0000 mg | Freq: Four times a day (QID) | RECTAL | Status: DC | PRN

## 2022-07-06 MED ORDER — POTASSIUM CHLORIDE CRYS ER 20 MEQ PO TBCR
40.0000 meq | EXTENDED_RELEASE_TABLET | Freq: Once | ORAL | Status: AC
  Administered 2022-07-06: 40 meq via ORAL
  Filled 2022-07-06: qty 2

## 2022-07-06 MED ORDER — FUROSEMIDE 10 MG/ML IJ SOLN
40.0000 mg | Freq: Once | INTRAMUSCULAR | Status: AC
  Administered 2022-07-06: 40 mg via INTRAVENOUS
  Filled 2022-07-06: qty 4

## 2022-07-06 NOTE — Progress Notes (Signed)
   07/06/22 1332  Spiritual Encounters  Type of Visit Initial  Care provided to: Pt and family  Conversation partners present during encounter Nurse  Referral source Nurse (RN/NT/LPN)  Reason for visit Advance directives  OnCall Visit Yes   RN called chaplain to let me know that PT and daughter were inquiring about updating their HCPOA.  I took paperwork to them and gave them the education.  Chaplain informed them that there is no notary on weekends.  They will fill out papers and I will follow up with them in morning to get notarization.

## 2022-07-06 NOTE — ED Notes (Signed)
ED TO INPATIENT HANDOFF REPORT  ED Nurse Name and Phone #: Aggie Cosier [phone]  S Name/Age/Gender Carl Woods 89 y.o. male Room/Bed: 029C/029C  Code Status   Code Status: Full Code  Home/SNF/Other Home Patient oriented to: self, place, time, and situation Is this baseline? Yes   Triage Complete: Triage complete  Chief Complaint Symptomatic anemia [D64.9]  Triage Note Pt arrives via EMS from home. Pt reports intermittent chest pressure and sob for the past month. States it has worsened over the past couple of days. Pt is AxOx4.    Allergies No Known Allergies  Level of Care/Admitting Diagnosis ED Disposition     ED Disposition  Admit   Condition  --   Comment  Hospital Area: [facility] [[id number]]  Level of Care: Progressive [102]  Admit to Progressive based on following criteria: CARDIOVASCULAR & THORACIC of moderate stability with acute coronary syndrome symptoms/low risk myocardial infarction/hypertensive urgency/arrhythmias/heart failure potentially compromising stability and stable post cardiovascular intervention patients.  Admit to Progressive based on following criteria: GI, ENDOCRINE disease patients with GI bleeding, acute liver failure or pancreatitis, stable with diabetic ketoacidosis or thyrotoxicosis (hypothyroid) state.  May admit patient to [facility] or [facility] if equivalent level of care is available:: No  Covid Evaluation: Asymptomatic - no recent exposure (last 10 days) testing not required  Diagnosis: Symptomatic anemia [[id number]]  Admitting Physician: Synetta Fail [[id number]]  Attending Physician: Synetta Fail [phone]  Certification:: I certify this patient will need inpatient services for at least 2 midnights  Estimated Length of Stay: 2          B Medical/Surgery History Past Medical History:  Diagnosis Date   Anal fistula    Arthritis    Fingers and hands   Atrial fibrillation (HCC)    AVM  (arteriovenous malformation)    Clipped during Colonoscopy 05/2016   Barrett's esophagus    Bilateral cataracts    BPH (benign prostatic hyperplasia)    Cecal angiodysplasia 05/28/2016   ablated at colonoscopy   Deviated nasal septum    Diverticulosis of sigmoid colon    E. 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   Left Forehead (treatment after biopsy)   OSA on CPAP    Perianal rash    Recurrent epistaxis    Renal cyst 01/04/2013   Small left peripelvic renal cysts , noted on US Renal   SCCA (squamous cell carcinoma) of skin 10/17/2015   Left Sup Bridge of Nose (curet, cautery and 5FU)   Seasonal allergies    Superficial basal cell carcinoma (BCC) 10/17/2015   Left Bulb of Nose (curet, cautery and 5FU)   Tubular adenoma    Past Surgical History:  Procedure Laterality Date   BILIARY STENT PLACEMENT N/A 01/23/2022   Procedure: BILIARY STENT PLACEMENT;  Surgeon: Lemar Lofty., MD;  Location: [facility];  Service: Gastroenterology;  Laterality: N/A;   BIOPSY  01/23/2022   Procedure: BIOPSY;  Surgeon: Meridee Score Netty Starring., MD;  Location: [facility];  Service: Gastroenterology;;   COLONOSCOPY  multiple   CYSTOSCOPY     ENDOSCOPIC RETROGRADE CHOLANGIOPANCREATOGRAPHY (ERCP) WITH PROPOFOL N/A 01/23/2022   Procedure: ENDOSCOPIC RETROGRADE CHOLANGIOPANCREATOGRAPHY (ERCP) WITH PROPOFOL;  Surgeon: Lemar Lofty., MD;  Location: [facility];  Service: Gastroenterology;  Laterality: N/A;  ESOPHAGOGASTRODUODENOSCOPY  multiple   ESOPHAGOGASTRODUODENOSCOPY N/A 01/23/2022   Procedure: ESOPHAGOGASTRODUODENOSCOPY (EGD);  Surgeon: Lemar Lofty., MD;  Location: [facility];  Service: Gastroenterology;  Laterality: N/A;   EUS N/A 01/23/2022   Procedure: UPPER ENDOSCOPIC ULTRASOUND (EUS) RADIAL;  Surgeon:  Lemar Lofty., MD;  Location: [facility];  Service: Gastroenterology;  Laterality: N/A;   EVALUATION UNDER ANESTHESIA WITH FISTULECTOMY N/A 03/02/2018   Procedure: ANORECTAL EXAM UNDER ANESTHESIA WITH REPAIR OF SUPERFICIAL PERIRECTAL FISTULA AND HEMORRHOIDECTOMY;  Surgeon: [name], MD;  Location: [facility];  Service: General;  Laterality: N/A;   EXPLORATORY LAPAROTOMY     REMOVAL OF STONES  01/23/2022   Procedure: REMOVAL OF STONES;  Surgeon: Lemar Lofty., MD;  Location: [facility];  Service: Gastroenterology;;   Dennison Mascot  01/23/2022   Procedure: SPHINCTEROTOMY;  Surgeon: Lemar Lofty., MD;  Location: [facility];  Service: Gastroenterology;;     A IV Location/Drains/Wounds Patient Lines/Drains/Airways Status     Active Line/Drains/Airways     Name Placement date Placement time Site Days   Peripheral IV 07/06/22 20 G Left Forearm 07/06/22  1046  Forearm  less than 1   Peripheral IV 07/06/22 20 G Anterior;Distal;Left;Upper Arm 07/06/22  0930  Arm  less than 1   GI Stent 01/23/22  1510  --  164            Intake/Output Last 24 hours  Intake/Output Summary (Last 24 hours) at 07/06/2022 1906 Last data filed at 07/06/2022 1750 Gross per 24 hour  Intake --  Output 2200 ml  Net -2200 ml    Labs/Imaging Results for orders placed or performed during the hospital encounter of 07/06/22 (from the past 48 hour(s))  Basic metabolic panel     Status: Abnormal   Collection Time: 07/06/22  9:34 AM  Result Value Ref Range   Sodium 137 135 - 145 mmol/L   Potassium 3.4 (L) 3.5 - 5.1 mmol/L   Chloride 111 98 - 111 mmol/L   CO2 18 (L) 22 - 32 mmol/L   Glucose, Bld 133 (H) 70 - 99 mg/dL    Comment: Glucose reference range applies only to samples taken after fasting for at least 8 hours.   BUN 28 (H) 8 - 23 mg/dL   Creatinine, Ser 1.61 (H) 0.61 - 1.24 mg/dL   Calcium 8.7 (L) 8.9 - 10.3 mg/dL   GFR, Estimated 44 (L) >60 mL/min    Comment:  (NOTE) Calculated using the CKD-EPI Creatinine Equation (2021)    Anion gap 8 5 - 15    Comment: Performed at [facility], [address] [place], [place] [address]  CBC     Status: Abnormal   Collection Time: 07/06/22  9:34 AM  Result Value Ref Range   WBC 12.3 (H) 4.0 - 10.5 K/uL   RBC 1.31 (L) 4.22 - 5.81 MIL/uL   Hemoglobin 5.0 (LL) 13.0 - 17.0 g/dL    Comment: REPEATED TO VERIFY THIS CRITICAL RESULT HAS VERIFIED AND BEEN CALLED TO I Truth Barot RN BY DANIELLE LONG ON 06 09 2024 AT 1017, AND HAS BEEN READ BACK.     HCT 16.4 (L) 39.0 - 52.0 %   MCV 125.2 (H) 80.0 - 100.0 fL   MCH 38.2 (H) 26.0 - 34.0 pg   MCHC 30.5 30.0 - 36.0 g/dL   RDW 54.0 (H) 98.1 - 19.1 %   Platelets 106 (L) 150 - 400 K/uL    Comment: REPEATED TO VERIFY   nRBC 1.1 (  H) 0.0 - 0.2 %    Comment: Performed at [facility], [address], [place], [place] [address]  Troponin I (High Sensitivity)     Status: Abnormal   Collection Time: 07/06/22  9:34 AM  Result Value Ref Range   Troponin I (High Sensitivity) 138 (HH) <18 ng/L    Comment: CRITICAL RESULT CALLED TO, READ BACK BY AND VERIFIED WITH I,Gerarda Conklin RN @1043  07/06/22 E,[place] (NOTE) Elevated high sensitivity troponin I (hsTnI) values and significant  changes across serial measurements may suggest ACS but many other  chronic and acute conditions are known to elevate hsTnI results.  Refer to the "Links" section for chest pain algorithms and additional  guidance. Performed at [facility], [address], [place], [place] [address]   Type and screen [facility]     Status: None (Preliminary result)   Collection Time: 07/06/22  9:34 AM  Result Value Ref Range   ABO/RH(D) A NEG    Antibody Screen NEG    Sample Expiration 07/09/2022,2359    Unit Number [id number]    Blood Component Type RED CELLS,LR    Unit division 00    Status of Unit ISSUED    Transfusion Status OK TO TRANSFUSE    Crossmatch Result Compatible    Unit  Number [id number]    Blood Component Type RBC LR PHER1    Unit division 00    Status of Unit ISSUED    Transfusion Status OK TO TRANSFUSE    Crossmatch Result      Compatible Performed at [facility], [address] [place], [place] [address]   Magnesium     Status: Abnormal   Collection Time: 07/06/22  9:34 AM  Result Value Ref Range   Magnesium 1.5 (L) 1.7 - 2.4 mg/dL    Comment: Performed at [facility], [address], [place], [place] [address]  Brain natriuretic peptide     Status: Abnormal   Collection Time: 07/06/22  9:36 AM  Result Value Ref Range   B Natriuretic Peptide 413.5 (H) 0.0 - 100.0 pg/mL    Comment: Performed at [facility], [address], [place], [place] [address]  Protime-INR     Status: Abnormal   Collection Time: 07/06/22  9:36 AM  Result Value Ref Range   Prothrombin Time 19.1 (H) 11.4 - 15.2 seconds   INR 1.6 (H) 0.8 - 1.2    Comment: (NOTE) INR goal varies based on device and disease states. Performed at [facility], [address], [place], [place] [address]   POC occult blood, ED Provider will collect     Status: None   Collection Time: 07/06/22  9:43 AM  Result Value Ref Range   Fecal Occult Bld NEGATIVE NEGATIVE  Prepare RBC (crossmatch)     Status: None   Collection Time: 07/06/22 10:44 AM  Result Value Ref Range   Order Confirmation      ORDER PROCESSED BY BLOOD BANK Performed at [facility], [address], [place], [place] [address]   Troponin I (High Sensitivity)     Status: Abnormal   Collection Time: 07/06/22 11:01 AM  Result Value Ref Range   Troponin I (High Sensitivity) 247 (HH) <18 ng/L    Comment: CRITICAL VALUE NOTED. VALUE IS CONSISTENT WITH PREVIOUSLY REPORTED/CALLED VALUE DELTA CHECK NOTED (NOTE) Elevated high sensitivity troponin I (hsTnI) values and significant  changes across serial measurements may suggest  ACS but many other  chronic and acute conditions are known to elevate  hsTnI results.  Refer to the "Links" section for chest pain algorithms and additional  guidance. Performed at [facility], [address] [place], [place] [address]   Folate     Status: None   Collection Time: 07/06/22 11:01 AM  Result Value Ref Range   Folate 7.3 >5.9 ng/mL    Comment: Performed at [facility], [address], [place], [place] [address]  Iron and TIBC     Status: Abnormal   Collection Time: 07/06/22 11:01 AM  Result Value Ref Range   Iron 216 (H) 45 - 182 ug/dL   TIBC 098 119 - 147 ug/dL   Saturation Ratios 63 (H) 17.9 - 39.5 %   UIBC 130 ug/dL    Comment: Performed at [facility], [address], [place], [place] [address]  Ferritin     Status: None   Collection Time: 07/06/22 11:01 AM  Result Value Ref Range   Ferritin 172 24 - 336 ng/mL    Comment: Performed at [facility], [address], [place], [place] [address]  Protime-INR     Status: Abnormal   Collection Time: 07/06/22  5:00 PM  Result Value Ref Range   Prothrombin Time 18.5 (H) 11.4 - 15.2 seconds   INR 1.5 (H) 0.8 - 1.2    Comment: (NOTE) INR goal varies based on device and disease states. Performed at [facility], [address], [place], [place] [address]    US Abdomen Complete  Result Date: 07/06/2022 CLINICAL DATA:  Cirrhosis. EXAM: ABDOMEN ULTRASOUND COMPLETE COMPARISON:  PET CT 02/13/2022, MRCP 12/16/2021 FINDINGS: Gallbladder: Partially distended. Wall thickening of 6 mm. No gallstones. No sonographic Murphy sign noted by sonographer. Common bile duct: Diameter: Previous common bile duct dilatation is not seen on the current exam, visualized common bile duct measures 4 mm. Pneumobilia on prior PET is not seen by ultrasound. Liver: Heterogeneous hepatic parenchyma, mildly increased. Nodular patter contours. There is no discrete hepatic lesion, although portions of the liver are suboptimally assessed. No flow is demonstrated in the main portal vein. IVC: No  abnormality visualized. Pancreas: Visualized portion unremarkable. Spleen: Size and appearance within normal limits. No splenomegaly, greatest splenic length of 10.3 cm. Right Kidney: Length: 8.5 cm. Increased parenchymal echogenicity with renal parenchymal thinning. No hydronephrosis. No evidence of focal lesion. Left Kidney: Length: 8.8 cm. Increased parenchymal echogenicity with renal parenchymal thinning. No hydronephrosis. Renal sinus cysts on prior PET are not well seen. No evidence of stone or focal lesion. Abdominal aorta: No aneurysm visualized. Other findings: No definite abdominal ascites. IMPRESSION: 1. Technically limited exam. Cirrhotic hepatic morphology, no evidence of focal liver lesion. 2. No flow is demonstrated in the main portal vein. It is unclear if this is due to technical factors or true portal vein occlusion. Recommend contrast-enhanced CT for further assessment. 3. Increased renal parenchymal echogenicity with parenchymal thinning typical of chronic medical renal disease. No hydronephrosis. Electronically Signed   By: Narda Rutherford M.D.   On: 07/06/2022 17:32   DG Chest Portable 1 View  Result Date: 07/06/2022 CLINICAL DATA:  Chest pain EXAM: PORTABLE CHEST 1 VIEW COMPARISON:  Chest CT 02/07/2022. FINDINGS: The heart is enlarged, unchanged. The upper mediastinal contours are normal. There are small left larger than right pleural effusions with adjacent airspace opacities in the lung bases. The upper lungs well aerated. There is no evidence  of overt pulmonary edema. There is no pneumothorax There is no acute osseous abnormality. IMPRESSION: Left larger than right pleural effusions with adjacent airspace opacities which may reflect atelectasis or pneumonia. Electronically Signed   By: Lesia Hausen M.D.   On: 07/06/2022 10:01    Pending Labs Unresulted Labs (From admission, onward)     Start     Ordered   07/07/22 0500  Comprehensive metabolic panel  Tomorrow morning,   R         07/06/22 1246   07/07/22 0500  CBC  Tomorrow morning,   R        07/06/22 1246   07/06/22 1243  Reticulocytes  Add-on,   AD        07/06/22 1246   07/06/22 1243  Vitamin B12  Add-on,   AD        07/06/22 1246            Vitals/Pain Today's Vitals   07/06/22 1630 07/06/22 1747 07/06/22 1749 07/06/22 1815  BP: 112/61  (!) 103/46 114/67  Pulse: 75  71 79  Resp: 18  (!) 21 (!) 22  Temp:   97.9 F (36.6 C)   TempSrc:      SpO2: 100%  100% 100%  Weight:      Height:      PainSc:  0-No pain      Isolation Precautions No active isolations  Medications Medications  pantoprozole (PROTONIX) 80 mg /NS 100 mL infusion (8 mg/hr Intravenous New Bag/Given 07/06/22 1118)  levothyroxine (SYNTHROID) tablet 112 mcg (has no administration in time range)  sodium chloride flush (NS) 0.9 % injection 3 mL (3 mLs Intravenous Given 07/06/22 1409)  acetaminophen (TYLENOL) tablet 650 mg (has no administration in time range)    Or  acetaminophen (TYLENOL) suppository 650 mg (has no administration in time range)  bumetanide (BUMEX) injection 1 mg (1 mg Intravenous Given 07/06/22 1501)  pantoprazole (PROTONIX) 80 mg /NS 100 mL IVPB (0 mg Intravenous Stopped 07/06/22 1115)  0.9 %  sodium chloride infusion (Manually program via Guardrails IV Fluids) (0 mLs Intravenous Stopped 07/06/22 1408)  furosemide (LASIX) injection 40 mg (40 mg Intravenous Given 07/06/22 1100)  potassium chloride SA (KLOR-CON M) CR tablet 40 mEq (40 mEq Oral Given 07/06/22 1117)    Mobility walks with device     Focused Assessments Cardiac Assessment Handoff:  Cardiac Rhythm: Atrial fibrillation No results found for: "CKTOTAL", "CKMB", "CKMBINDEX", "TROPONINI" No results found for: "DDIMER" Does the Patient currently have chest pain? No    R Recommendations: See Admitting Provider Note  Report given to:   Additional Notes:

## 2022-07-06 NOTE — ED Notes (Signed)
Report called to [facility] RN pt to transfer via gurney to room 6ebed 5 c with protonix drip infusing

## 2022-07-06 NOTE — ED Notes (Signed)
CRITICAL VALUE STICKER  CRITICAL VALUE:Hgb 5.0  RECEIVER (on-site recipient of call):I. Ciaran Begay  DATE & TIME NOTIFIED: 07/06/22 1018  MESSENGER (representative from lab):Duwayne Heck  MD NOTIFIED: Eloise Harman  TIME OF NOTIFICATION:1018  RESPONSE:

## 2022-07-06 NOTE — ED Triage Notes (Signed)
Pt arrives via EMS from home. Pt reports intermittent chest pressure and sob for the past month. States it has worsened over the past couple of days. Pt is AxOx4.

## 2022-07-06 NOTE — ED Provider Notes (Signed)
[place] EMERGENCY DEPARTMENT AT [facility] Provider Note   CSN: [id number] Arrival date & time: 07/06/22  2130     History {Add pertinent medical, surgical, social history, OB history to HPI:1} Chief Complaint  Patient presents with   Chest Pain   Shortness of Breath    Carl Woods is a 89 y.o. male.  89 year old male with a history of atrial fibrillation on Eliquis, colonic AVM, diverticulosis, CHF, ampulla of Vader cancer undergoing treatment, and iron deficiency anemia who presents to the emergency department with shortness of breath and chest discomfort.  Patient reports that over the past month has had intermittent chest pressure and shortness of breath that is exertional.  Also has been feeling dizzy over the past few days and has had black stools over the past 4 to 5 days.  Took his Eliquis last night but not this morning.  No diaphoresis or nausea or vomiting.  Says that he could barely make it to steps today because of his weakness and shortness of breath and decided to come into the emergency department for evaluation.  Does take iron but denies any Pepto-Bismol use.  No personal history of MI       Home Medications Prior to Admission medications   Medication Sig Start Date End Date Taking? Authorizing Provider  apixaban (ELIQUIS) 5 MG TABS tablet Take 1 tablet (5 mg total) by mouth 2 (two) times daily. 01/25/22   Mansouraty, Netty Starring., MD  Cholecalciferol (VITAMIN D3) 5000 units CAPS Take 1 capsule by mouth every evening.    [provider]  dapagliflozin propanediol (FARXIGA) 10 MG TABS tablet Take 10 mg by mouth in the morning. 05/29/20   [provider]  Fe Fum-Fe Poly-Vit C-Lactobac (FUSION) 65-65-25-30 MG CAPS Take 1 capsule by mouth 3 (three) times a week.    [provider]  fexofenadine (ALLEGRA) 180 MG tablet Take 180 mg by mouth as needed for allergies or rhinitis.    [provider]  furosemide (LASIX) 20 MG  tablet Take 20 mg by mouth in the morning. 02/04/16   [provider]  Glucosamine Sulfate 500 MG TABS Take by mouth. 07/08/16   [provider]  Glucosamine-Chondroit-Vit C-Mn (GLUCOSAMINE CHONDR 500 COMPLEX PO) Take 1 tablet by mouth daily.     [provider]  ketoconazole (NIZORAL) 2 % cream Apply 1 Application topically 2 (two) times daily as needed (skin rash/irritation.).    [provider]  levothyroxine (SYNTHROID, LEVOTHROID) 112 MCG tablet Take 112 mcg by mouth daily before breakfast.  02/11/16   [provider]  losartan (COZAAR) 25 MG tablet Take 25 mg by mouth in the morning. 02/05/16   [provider]  metoprolol succinate (TOPROL-XL) 25 MG 24 hr tablet TAKE 1 TABLET (25 MG TOTAL) BY MOUTH DAILY. TAKE WITH OR IMMEDIATELY FOLLOWING A MEAL. 05/06/21 06/19/22  [name], MD  nitroGLYCERIN (NITROSTAT) 0.4 MG SL tablet Place 1 tablet (0.4 mg total) under the tongue every 5 (five) minutes as needed for up to 25 days for chest pain. 01/31/20 01/18/24  [name], MD  olopatadine (PATANOL) 0.1 % ophthalmic solution Place 1 drop into both eyes 2 (two) times daily. 07/08/16   [provider]  omeprazole (PRILOSEC) 20 MG capsule Take 1 capsule (20 mg total) by mouth 2 (two) times daily before a meal. Twice daily for 1 month.  Then return back to daily dosing. 01/23/22   Mansouraty, Netty Starring., MD  sodium chloride Leotis Shames)  0.65 % SOLN nasal spray Place 1 spray into both nostrils as needed for congestion.    [provider]      Allergies    Patient has no known allergies.    Review of Systems   Review of Systems  Physical Exam Updated Vital Signs BP (!) 108/49   Pulse 96   Temp 98.6 F (37 C) (Oral)   Resp (!) 23   SpO2 96%  Physical Exam  ED Results / Procedures / Treatments   Labs (all labs ordered are listed, but only abnormal results are displayed) Labs Reviewed  BASIC METABOLIC PANEL  CBC  BRAIN NATRIURETIC  PEPTIDE  POC OCCULT BLOOD, ED  TYPE AND SCREEN  TROPONIN I (HIGH SENSITIVITY)    EKG None  Radiology No results found.  Procedures Procedures  {Document cardiac monitor, telemetry assessment procedure when appropriate:1}  Medications Ordered in ED Medications - No data to display  ED Course/ Medical Decision Making/ A&P   {   Click here for ABCD2, HEART and other calculatorsREFRESH Note before signing :1}                          Medical Decision Making Amount and/or Complexity of Data Reviewed Labs: ordered. Radiology: ordered.  Risk Prescription drug management. Decision regarding hospitalization.   ***  {Document critical care time when appropriate:1} {Document review of labs and clinical decision tools ie heart score, Chads2Vasc2 etc:1}  {Document your independent review of radiology images, and any outside records:1} {Document your discussion with family members, caretakers, and with consultants:1} {Document social determinants of health affecting pt's care:1} {Document your decision making why or why not admission, treatments were needed:1} Final Clinical Impression(s) / ED Diagnoses Final diagnoses:  None    Rx / DC Orders ED Discharge Orders     None

## 2022-07-06 NOTE — Consult Note (Signed)
CARDIOLOGY CONSULT NOTE  Patient ID: Carl Woods MRN: [id number] DOB/AGE: 1931-04-07 89 y.o.  Admit date: 07/06/2022 Attending physician: Synetta Fail, MD Primary Physician:  [name], MD Outpatient Cardiology Provider: Dr. [name] Inpatient Cardiologist: Tessa Lerner, DO, [facility]  Reason of consultation: Chest pain Referring physician: Dr. Rolly Pancake, ED provider  Chief complaint: Chest pain and shortness of breath  HPI:  Carl Woods is a 89 y.o. Caucasian male who presents with a chief complaint of " chest pain and shortness of breath." His past medical history and cardiovascular risk factors include: Permanent atrial fibrillation on anticoagulation, sleep apnea on CPAP, hypertension, emphysema, diabetes, Barrett's esophagus, diverticulosis, history of cancer of ampulla of Vater, advanced age.  Patient is accompanied by his daughter Randa Evens at bedside.  Patient has had a known history of shortness of breath and chest pain which he has been addressing medically.  However, over the last week he has noticed that the frequency of precordial chest pain is more often.  He describes it as a pressure-like sensation, worse with effort related activities, improves with resting.  Shortness of breath predominantly with effort related activities.  Denies orthopnea or PND but known history of lower extremity swelling.  No active chest pain.  Cardiology has been asked to evaluate the patient for chest pain and shortness of breath and optimize prior to upcoming endoscopy given his hemoglobin on admission being 5.0 g/dL.  Currently on anticoagulation for thromboembolic prophylaxis given his permanent atrial fibrillation.  He has also been having melanotic stools.  Overall functional capacity is limited.  But takes care of his wife who has dementia.  Last ischemic workup in 2023.  Status post radiation as of March 2024.  No known history of coronary interventions or congestive  heart failure.  ALLERGIES: No Known Allergies  PAST MEDICAL HISTORY: Past Medical History:  Diagnosis Date   Anal fistula    Arthritis    Fingers and hands   Atrial fibrillation (HCC)    AVM (arteriovenous malformation)    Clipped during Colonoscopy 05/2016   Barrett's esophagus    Bilateral cataracts    BPH (benign prostatic hyperplasia)    Cecal angiodysplasia 05/28/2016   ablated at colonoscopy   Deviated nasal septum    Diverticulosis of sigmoid colon    E. 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   Left Forehead (treatment after biopsy)   OSA on CPAP    Perianal rash    Recurrent epistaxis    Renal cyst 01/04/2013   Small left peripelvic renal cysts , noted on US Renal   SCCA (squamous cell carcinoma) of skin 10/17/2015   Left Sup Bridge of Nose (curet, cautery and 5FU)   Seasonal allergies    Superficial basal cell carcinoma (BCC) 10/17/2015   Left Bulb of Nose (curet, cautery and 5FU)   Tubular adenoma     PAST SURGICAL HISTORY: Past Surgical History:  Procedure Laterality Date   BILIARY STENT PLACEMENT N/A 01/23/2022   Procedure: BILIARY STENT PLACEMENT;  Surgeon: Lemar Lofty., MD;  Location: [facility];  Service: Gastroenterology;  Laterality: N/A;   BIOPSY  01/23/2022   Procedure: BIOPSY;  Surgeon: Meridee Score Netty Starring., MD;  Location: [facility];  Service: Gastroenterology;;   COLONOSCOPY  multiple   CYSTOSCOPY  ENDOSCOPIC RETROGRADE CHOLANGIOPANCREATOGRAPHY (ERCP) WITH PROPOFOL N/A 01/23/2022   Procedure: ENDOSCOPIC RETROGRADE CHOLANGIOPANCREATOGRAPHY (ERCP) WITH PROPOFOL;  Surgeon: Meridee Score Netty Starring., MD;  Location: [facility];  Service: Gastroenterology;  Laterality: N/A;   ESOPHAGOGASTRODUODENOSCOPY  multiple   ESOPHAGOGASTRODUODENOSCOPY N/A 01/23/2022   Procedure:  ESOPHAGOGASTRODUODENOSCOPY (EGD);  Surgeon: Lemar Lofty., MD;  Location: [facility];  Service: Gastroenterology;  Laterality: N/A;   EUS N/A 01/23/2022   Procedure: UPPER ENDOSCOPIC ULTRASOUND (EUS) RADIAL;  Surgeon: Lemar Lofty., MD;  Location: [facility];  Service: Gastroenterology;  Laterality: N/A;   EVALUATION UNDER ANESTHESIA WITH FISTULECTOMY N/A 03/02/2018   Procedure: ANORECTAL EXAM UNDER ANESTHESIA WITH REPAIR OF SUPERFICIAL PERIRECTAL FISTULA AND HEMORRHOIDECTOMY;  Surgeon: [name], MD;  Location: [facility];  Service: General;  Laterality: N/A;   EXPLORATORY LAPAROTOMY     REMOVAL OF STONES  01/23/2022   Procedure: REMOVAL OF STONES;  Surgeon: Lemar Lofty., MD;  Location: [facility];  Service: Gastroenterology;;   Dennison Mascot  01/23/2022   Procedure: Dennison Mascot;  Surgeon: Mansouraty, Netty Starring., MD;  Location: [facility];  Service: Gastroenterology;;    FAMILY HISTORY: The patient's family history includes Heart disease in his mother; Other in his father; Ovarian cancer in his sister.   SOCIAL HISTORY:  The patient  reports that he quit smoking about 18 years ago. His smoking use included cigarettes. He has a 20.00 pack-year smoking history. He has never used smokeless tobacco. He reports current alcohol use. He reports that he does not use drugs.  MEDICATIONS: Current Outpatient Medications  Medication Instructions   apixaban (ELIQUIS) 5 mg, Oral, 2 times daily   Cholecalciferol (VITAMIN D3) 5000 units CAPS 1 capsule, Oral, Every evening   dapagliflozin propanediol (FARXIGA) 10 mg, Oral, Every morning   Fe Fum-Fe Poly-Vit C-Lactobac (FUSION) 65-65-25-30 MG CAPS 1 capsule, Oral, 3 times weekly   fexofenadine (ALLEGRA) 180 mg, Oral, As needed   furosemide (LASIX) 20 mg, Oral, Every morning   Glucosamine Sulfate 500 mg, Oral, Daily   Glucosamine-Chondroit-Vit C-Mn (GLUCOSAMINE CHONDR 500 COMPLEX PO) 1 tablet, Oral, Daily    ketoconazole (NIZORAL) 2 % cream 1 Application, Topical, 2 times daily PRN   levothyroxine (SYNTHROID) 112 mcg, Oral, Daily before breakfast   losartan (COZAAR) 25 mg, Oral, Every morning   metoprolol succinate (TOPROL-XL) 25 mg, Oral, Daily, Take with or immediately following a meal.   nitroGLYCERIN (NITROSTAT) 0.4 mg, Sublingual, Every 5 min PRN   olopatadine (PATANOL) 0.1 % ophthalmic solution 1 drop, Both Eyes, Daily PRN   omeprazole (PRILOSEC) 20 mg, Oral, 2 times daily before meals, Twice daily for 1 month.  Then return back to daily dosing.   sodium chloride (OCEAN) 0.65 % SOLN nasal spray 1 spray, Each Nare, As needed    REVIEW OF SYSTEMS: Review of Systems  Cardiovascular:  Positive for chest pain and palpitations. Negative for claudication, dyspnea on exertion, irregular heartbeat, leg swelling, near-syncope, orthopnea, paroxysmal nocturnal dyspnea and syncope.  Respiratory:  Positive for shortness of breath.   Hematologic/Lymphatic: Negative for bleeding problem.  Musculoskeletal:  Negative for muscle cramps and myalgias.  Gastrointestinal:  Positive for melena.  Neurological:  Positive for dizziness. Negative for light-headedness.    PHYSICAL EXAMINATION: PHYSICAL EXAM: Temp:  [97.8 F (36.6 C)-98.6 F (37 C)] 98.1 F (36.7 C) (06/09 1445) Pulse Rate:  [75-96] 85 (06/09 1445) Resp:  [17-25] 23 (06/09 1445) BP: (102-121)/(48-59) 111/58 (06/09 1445) SpO2:  [95 %-100 %] 100 % (06/09 1445) Weight:  [99.8 kg] 99.8 kg (06/09  1031)  Today's Vitals   07/06/22 1345 07/06/22 1400 07/06/22 1430 07/06/22 1445  BP: (!) 107/57 (!) 108/53 (!) 104/48 (!) 111/58  Pulse: 85 75 77 85  Resp: (!) 25 (!) 23 (!) 22 (!) 23  Temp: 98.1 F (36.7 C)  98.3 F (36.8 C) 98.1 F (36.7 C)  TempSrc: Oral     SpO2: 100% 100% 99% 100%  Weight:      Height:      PainSc:       Body mass index is 33.45 kg/m.  Intake/Output:  Intake/Output Summary (Last 24 hours) at 07/06/2022 1511 Last data  filed at 07/06/2022 1408 Gross per 24 hour  Intake --  Output 1000 ml  Net -1000 ml     Net IO Since Admission: -1,000 mL [07/06/22 1511]  Weights:     07/06/2022   10:31 AM 06/19/2022   10:08 AM 06/12/2022    1:24 PM  Last 3 Weights  Weight (lbs) 220 lb 225 lb 230 lb 14.4 oz  Weight (kg) 99.791 kg 102.059 kg 104.736 kg     Physical Exam  Constitutional: No distress. He appears chronically ill.  Older than stated age, hemodynamically stable, pale in appearance  HENT:  Poor dentition  Neck: No JVD present.  Cardiovascular: Normal rate, S1 normal and S2 normal. An irregularly irregular rhythm present. Exam reveals no gallop, no S3 and no S4.  No murmur heard. Pulmonary/Chest: No stridor. He has no wheezes. He has bibasilar rales.  Decreased breath sounds bilaterally.  Abdominal: Soft. Bowel sounds are normal. He exhibits no distension. There is no abdominal tenderness.  Musculoskeletal:        General: No edema (Bilateral, up to the mid thighs laterally).     Cervical back: Neck supple.     Comments: Range of motion reduced  Neurological: He is alert and oriented to person, place, and time. He has intact cranial nerves (2-12).  Skin: Skin is warm and moist. Rash (Around left knee.) noted.    LAB RESULTS: Chemistry Recent Labs  Lab 07/06/22 0934  NA 137  K 3.4*  CL 111  CO2 18*  GLUCOSE 133*  BUN 28*  CREATININE 1.50*  CALCIUM 8.7*  GFRNONAA 44*  ANIONGAP 8    Hematology Recent Labs  Lab 07/06/22 0934  WBC 12.3*  RBC 1.31*  HGB 5.0*  HCT 16.4*  MCV 125.2*  MCH 38.2*  MCHC 30.5  RDW 24.4*  PLT 106*   High Sensitivity Troponin:   Recent Labs  Lab 07/06/22 0934 07/06/22 1101  TROPONINIHS 138* 247*     Cardiac EnzymesNo results for input(s): "TROPONINI" in the last 168 hours. No results for input(s): "TROPIPOC" in the last 168 hours.  BNP Recent Labs  Lab 07/06/22 0936  BNP 413.5*    DDimer No results for input(s): "DDIMER" in the last 168 hours.   Hemoglobin A1c: No results found for: "HGBA1C", "MPG" TSH No results for input(s): "TSH" in the last 8760 hours. Lipid Panel No results found for: "CHOL", "HDL", "LDLCALC", "LDLDIRECT", "TRIG", "CHOLHDL" Drugs of Abuse  No results found for: "LABOPIA", "COCAINSCRNUR", "LABBENZ", "AMPHETMU", "THCU", "LABBARB"    CARDIAC DATABASE: RISK SCORES Click Here to Calculate/Change CHADS2VASc Score The patient's CHADS2-VASc score is 3, indicating a 3.2% annual risk of stroke.  CHF History: No HTN History: Yes Diabetes History: No Stroke History: No Vascular Disease History: No  EKG: July 06, 2022: Atrial fibrillation, 92 bpm, low voltage, consider lateral ischemia, rare PVCs.  Echocardiogram: 06/18/2021:  Normal LV systolic function with visual EF 55-60%. Left ventricle cavity is normal in size. Normal left ventricular wall thickness. Normal global wall motion. Unable to evaluate diastolic function due to atrial fibrillation. Trace aortic regurgitation. Mild tricuspid regurgitation. No evidence of pulmonary hypertension. RVSP measures 33 mmHg. Compared to 02/06/2020 small pericardial effusion has resolved otherwise no significant change.  Stress Testing:  Lexiscan Tetrofosmin stress test 02/08/2020: Lexiscan nuclear stress test performed using 1-day protocol. Normal myocardial perfusion. Stress LVEF 76%. Low risk study.  Scheduled Meds:  bumetanide (BUMEX) IV  1 mg Intravenous Q12H   [START ON 07/07/2022] levothyroxine  112 mcg Oral QAC breakfast   sodium chloride flush  3 mL Intravenous Q12H    Continuous Infusions:  pantoprazole 8 mg/hr (07/06/22 1118)    PRN Meds: acetaminophen **OR** acetaminophen  IMPRESSION & RECOMMENDATIONS: Carl Woods is a 89 y.o. Caucasian male whose past medical history and cardiovascular risk factors include: Permanent atrial fibrillation on anticoagulation, sleep apnea on CPAP, hypertension, emphysema, diabetes, Barrett's esophagus, diverticulosis,  history of cancer of ampulla of Vater, advanced age.  Impression:  NSTEMI likely secondary to supply demand ischemia/symptomatic anemia Shortness of breath. Volume overload. Symptomatic severe anemia (baseline hemoglobin 10, hemoglobin on arrival 5 g/dL) Acute kidney injury. Permanent atrial fibrillation. 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 Hypokalemia  Plan:  NSTEMI Shortness of breath. Volume overload Pleural effusion History of angina but more pronounced days leading up to the hospitalization. Precipitated by severe symptomatic anemia with hemoglobin on arrival at 5 g/dL (baseline around 10 g/dL) EKG shows ST depressions in the lateral leads, endorses anginal symptoms, and cardiac biomarkers elevated. Recommend correcting his underlying anemia and reevaluate anginal symptoms. Currently chest pain-free Recommend a hemoglobin of 8 g/dL. Facilitate diuresis to optimize him for upcoming endoscopy. EKGs as needed. IV heparin per ACS protocol not indicated in the setting of GI bleed. Strict I's and O's, daily weights. Start Bumex 1 mg twice daily. Net IO Since Admission: -1,000 mL [07/06/22 1511] Plan echo to evaluate for LVEF, diastolic dysfunction, valvular heart disease, and regional wall motion abnormalities. Would recommend optimizing his hemoglobin, volume status, and reevaluating his symptoms prior to endoscopy.  If endoscopy is not emergent would recommend optimizing him over the next 24 hours. Preprocedural risk stratification forthcoming.  Symptomatic severe anemia (baseline hemoglobin 10, hemoglobin on arrival 5 g/dL) Currently being followed by gastroenterology. Scheduled to have 2 units of PRBCs Recommend a hemoglobin of around 8 g/dL. Once clinically optimized patient will need endoscopy for further evaluation.  Acute kidney injury: Likely secondary to hypotension, intravascular depletion, and volume  overload Diuresis as noted above. Monitor BUN and creatinine.  Permanent atrial fibrillation. Long-term anticoagulation. Continue home dose metoprolol  Not on antiarrhythmic medications. CHA2DS2-VASc score is 3 Given his chronic anemia and now presents with severe symptomatic anemia with a hemoglobin of 5 g/dL recommend holding anticoagulation to minimize the risk of bleeding.  He is currently scheduled for 2 PRBCs. Both patient and daughter understand that if anticoagulation is held he is at high risk of thromboembolic event.  They verbalized understanding and will become more cognizant of symptoms.  Will reconsider prior to discharge.  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   Total encounter time 82 minutes. *Total Encounter Time as defined by the Centers for Medicare and Medicaid Services includes, in addition to the  face-to-face time of a patient visit (documented in the note above) non-face-to-face time: obtaining and reviewing outside history, ordering and reviewing medications, tests or procedures, care coordination (communications with other health care professionals or caregivers) and documentation in the medical record.  Patient's questions and concerns were addressed to his satisfaction. He voices understanding of the instructions provided during this encounter.   This note was created using a voice recognition software as a result there may be grammatical errors inadvertently enclosed that do not reflect the nature of this encounter. Every attempt is made to correct such errors.  [name] [facility]  Pager:  [phone] Office: [phone] 07/06/2022, 3:11 PM

## 2022-07-06 NOTE — Consult Note (Addendum)
Consultation  Referring Provider:  [facility]  Primary Care Physician:  [name], MD Primary Gastroenterologist:  Dr. Leone Payor      Reason for Consultation:  Symptomatic Anemia     LOS: 0 days          HPI:   Carl Woods is a 89 y.o. male with past medical history significant for past medical history as listed below including A-fib on Eliquis, Barrett's esophagus, diverticulosis, GERD presents for symptomatic anemia.  Patient has a history of cancer of ampulla of Vater diagnosed 12/2021 due to abnormal scan and elevated LFTs.  Follows with Dr. Mosetta Putt. See GI workup below.  Patient is not a candidate for Whipple surgery.  Is undergoing radiation without chemotherapy (last radiation dose 03/2022). Scheduled for repeat ERCP 08/20/22  Patient presents to emergency department for symptomatic anemia and melena. He states over the last few weeks he has become short of breath on exertion and over the last few days it has been more progressive. Reports chest "heaviness" associated with exertion. States he has had intermittent melena for the last 3 months becoming constant after iron therapy which was initiated 2 months ago. Denies abdominal pain, nausea, and vomiting. Denies weight loss. Denies NSAIDs, tobacco or alcohol use.  January hgb 10.4 March hgb 9.3 May hgb 7.3 Today hgb 5.0, MCV 125.2. platelets 106. BUN 28, Cr. 1.50, GFR 44 Troponin 138, BNP 413.5 Fecal occult negative BP 117/48, 87 HR  Patient with family at bedside, daughter. Provided some of the history.    PREVIOUS GI WORKUP -ERCP 01/23/2022 for periampullary mass on ultrasound: Major papilla appeared to be ulcerated/prominent/with a larger intraduodenal portion.  Entire main bile duct was severely dilated.  Biliary sphincterotomy performed.  Biliary tree swept and sludge was found.  Biopsy of ampulla.  1 plastic biliary stent placed into the common bile duct  -EUS/EGD 01/23/2022: EGD: No gross lesions in esophagus,  multiple gastric polyps, gastritis, nodular mucosa in duodenal bulb, duodenal ampullary deformity (large intraduodenal portion).  Submucosal ulcerated nodule at the ampulla  EUS showed dilation and common bile duct and common hepatic duct.  Hyperechoic material consistent with sludge.  Hyperechoic foci and cysts noted in the entire pancreas.  Pancreatic duct was prominent and dilated.  Lesion in the ampulla.  Nonmalignant appearing lymph nodes in the celiac region, peripancreatic region, and Porta hepatis   Past Medical History:  Diagnosis Date   Anal fistula    Arthritis    Fingers and hands   Atrial fibrillation (HCC)    AVM (arteriovenous malformation)    Clipped during Colonoscopy 05/2016   Barrett's esophagus    Bilateral cataracts    BPH (benign prostatic hyperplasia)    Cecal angiodysplasia 05/28/2016   ablated at colonoscopy   Deviated nasal septum    Diverticulosis of sigmoid colon    E. 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   Left Forehead (treatment after biopsy)   OSA on CPAP    Perianal rash    Recurrent epistaxis    Renal cyst 01/04/2013   Small left peripelvic renal cysts , noted on US Renal   SCCA (squamous cell carcinoma) of skin 10/17/2015   Left Sup Bridge of Nose (curet, cautery and 5FU)   Seasonal allergies    Superficial  basal cell carcinoma (BCC) 10/17/2015   Left Bulb of Nose (curet, cautery and 5FU)   Tubular adenoma     Surgical History:  He  has a past surgical history that includes Cystoscopy; Esophagogastroduodenoscopy (multiple); Colonoscopy (multiple); Exploratory laparotomy; Exam under anesthesia with fistulectomy (N/A, 03/02/2018); EUS (N/A, 01/23/2022); Endoscopic retrograde cholangiopancreatography (ercp) with propofol (N/A, 01/23/2022); biopsy (01/23/2022); Esophagogastroduodenoscopy (N/A,  01/23/2022); sphincterotomy (01/23/2022); removal of stones (01/23/2022); and biliary stent placement (N/A, 01/23/2022). Family History:  His family history includes Heart disease in his mother; Other in his father; Ovarian cancer in his sister. Social History:   reports that he quit smoking about 18 years ago. His smoking use included cigarettes. He has a 20.00 pack-year smoking history. He has never used smokeless tobacco. He reports current alcohol use. He reports that he does not use drugs.  Prior to Admission medications   Medication Sig Start Date End Date Taking? Authorizing Provider  apixaban (ELIQUIS) 5 MG TABS tablet Take 1 tablet (5 mg total) by mouth 2 (two) times daily. 01/25/22   Mansouraty, Netty Starring., MD  Cholecalciferol (VITAMIN D3) 5000 units CAPS Take 1 capsule by mouth every evening.    [provider]  dapagliflozin propanediol (FARXIGA) 10 MG TABS tablet Take 10 mg by mouth in the morning. 05/29/20   [provider]  Fe Fum-Fe Poly-Vit C-Lactobac (FUSION) 65-65-25-30 MG CAPS Take 1 capsule by mouth 3 (three) times a week.    [provider]  fexofenadine (ALLEGRA) 180 MG tablet Take 180 mg by mouth as needed for allergies or rhinitis.    [provider]  furosemide (LASIX) 20 MG tablet Take 20 mg by mouth in the morning. 02/04/16   [provider]  Glucosamine Sulfate 500 MG TABS Take by mouth. 07/08/16   [provider]  Glucosamine-Chondroit-Vit C-Mn (GLUCOSAMINE CHONDR 500 COMPLEX PO) Take 1 tablet by mouth daily.     [provider]  ketoconazole (NIZORAL) 2 % cream Apply 1 Application topically 2 (two) times daily as needed (skin rash/irritation.).    [provider]  levothyroxine (SYNTHROID, LEVOTHROID) 112 MCG tablet Take 112 mcg by mouth daily before breakfast.  02/11/16   [provider]  losartan (COZAAR) 25 MG tablet Take 25 mg by mouth in the morning. 02/05/16   [provider]   metoprolol succinate (TOPROL-XL) 25 MG 24 hr tablet TAKE 1 TABLET (25 MG TOTAL) BY MOUTH DAILY. TAKE WITH OR IMMEDIATELY FOLLOWING A MEAL. 05/06/21 06/19/22  [name], MD  nitroGLYCERIN (NITROSTAT) 0.4 MG SL tablet Place 1 tablet (0.4 mg total) under the tongue every 5 (five) minutes as needed for up to 25 days for chest pain. 01/31/20 01/18/24  [name], MD  olopatadine (PATANOL) 0.1 % ophthalmic solution Place 1 drop into both eyes 2 (two) times daily. 07/08/16   [provider]  omeprazole (PRILOSEC) 20 MG capsule Take 1 capsule (20 mg total) by mouth 2 (two) times daily before a meal. Twice daily for 1 month.  Then return back to daily dosing. 01/23/22   Mansouraty, Netty Starring., MD  sodium chloride (OCEAN) 0.65 % SOLN nasal spray Place 1 spray into both nostrils as needed for congestion.    [provider]    Current Facility-Administered Medications  Medication Dose Route Frequency Provider Last Rate Last Admin   0.9 %  sodium chloride infusion (Manually program via Guardrails IV Fluids)   Intravenous Once Rondel Baton, MD       pantoprozole (PROTONIX) 80  mg /NS 100 mL infusion  8 mg/hr Intravenous Continuous Rondel Baton, MD 10 mL/hr at 07/06/22 1118 8 mg/hr at 07/06/22 1118   Current Outpatient Medications  Medication Sig Dispense Refill   apixaban (ELIQUIS) 5 MG TABS tablet Take 1 tablet (5 mg total) by mouth 2 (two) times daily. 60 tablet    Cholecalciferol (VITAMIN D3) 5000 units CAPS Take 1 capsule by mouth every evening.     dapagliflozin propanediol (FARXIGA) 10 MG TABS tablet Take 10 mg by mouth in the morning.     Fe Fum-Fe Poly-Vit C-Lactobac (FUSION) 65-65-25-30 MG CAPS Take 1 capsule by mouth 3 (three) times a week.     fexofenadine (ALLEGRA) 180 MG tablet Take 180 mg by mouth as needed for allergies or rhinitis.     furosemide (LASIX) 20 MG tablet Take 20 mg by mouth in the morning.     Glucosamine Sulfate 500 MG TABS Take by mouth.      Glucosamine-Chondroit-Vit C-Mn (GLUCOSAMINE CHONDR 500 COMPLEX PO) Take 1 tablet by mouth daily.      ketoconazole (NIZORAL) 2 % cream Apply 1 Application topically 2 (two) times daily as needed (skin rash/irritation.).     levothyroxine (SYNTHROID, LEVOTHROID) 112 MCG tablet Take 112 mcg by mouth daily before breakfast.      losartan (COZAAR) 25 MG tablet Take 25 mg by mouth in the morning.     metoprolol succinate (TOPROL-XL) 25 MG 24 hr tablet TAKE 1 TABLET (25 MG TOTAL) BY MOUTH DAILY. TAKE WITH OR IMMEDIATELY FOLLOWING A MEAL. 90 tablet 3   nitroGLYCERIN (NITROSTAT) 0.4 MG SL tablet Place 1 tablet (0.4 mg total) under the tongue every 5 (five) minutes as needed for up to 25 days for chest pain. 25 tablet 3   olopatadine (PATANOL) 0.1 % ophthalmic solution Place 1 drop into both eyes 2 (two) times daily.     omeprazole (PRILOSEC) 20 MG capsule Take 1 capsule (20 mg total) by mouth 2 (two) times daily before a meal. Twice daily for 1 month.  Then return back to daily dosing. 60 capsule 6   sodium chloride (OCEAN) 0.65 % SOLN nasal spray Place 1 spray into both nostrils as needed for congestion.      Allergies as of 07/06/2022   (No Known Allergies)    Review of Systems  Constitutional:  Positive for malaise/fatigue. Negative for chills, fever and weight loss.  HENT:  Negative for hearing loss and tinnitus.   Eyes:  Negative for blurred vision and double vision.  Respiratory:  Positive for shortness of breath. Negative for cough.   Cardiovascular:  Positive for chest pain. Negative for palpitations.  Gastrointestinal:  Positive for melena. Negative for abdominal pain, blood in stool, constipation, diarrhea, heartburn, nausea and vomiting.  Genitourinary:  Negative for dysuria and urgency.  Musculoskeletal:  Negative for myalgias and neck pain.  Skin:  Negative for itching and rash.  Neurological:  Positive for weakness. Negative for seizures and loss of consciousness.   Psychiatric/Behavioral:  Negative for depression and suicidal ideas.        Physical Exam:  Vital signs in last 24 hours: Temp:  [97.8 F (36.6 C)-98.6 F (37 C)] 97.9 F (36.6 C) (06/09 1151) Pulse Rate:  [64-96] 87 (06/09 1151) Resp:  [16-25] 24 (06/09 1151) BP: (108-121)/(48-82) 117/48 (06/09 1151) SpO2:  [95 %-100 %] 99 % (06/09 1151) Weight:  [99.8 kg] 99.8 kg (06/09 1031)   Last BM recorded by nurses in past 5 days No  data recorded  Physical Exam Constitutional:      Appearance: He is ill-appearing.  HENT:     Head: Normocephalic and atraumatic.     Nose: Nose normal. No congestion.     Mouth/Throat:     Mouth: Mucous membranes are moist.     Pharynx: Oropharynx is clear.  Cardiovascular:     Rate and Rhythm: Normal rate and regular rhythm.  Pulmonary:     Effort: Pulmonary effort is normal. No respiratory distress.  Abdominal:     General: Abdomen is flat. Bowel sounds are normal. There is no distension.     Palpations: Abdomen is soft. There is no mass.     Tenderness: There is no abdominal tenderness. There is no guarding or rebound.     Hernia: No hernia is present.  Musculoskeletal:        General: No swelling. Normal range of motion.     Cervical back: Normal range of motion and neck supple.  Skin:    General: Skin is warm and dry.     Coloration: Skin is pale.  Neurological:     General: No focal deficit present.     Mental Status: He is alert and oriented to person, place, and time.  Psychiatric:        Mood and Affect: Mood normal.        Behavior: Behavior normal.        Thought Content: Thought content normal.        Judgment: Judgment normal.      LAB RESULTS: Recent Labs    07/06/22 0934  WBC 12.3*  HGB 5.0*  HCT 16.4*  PLT 106*   BMET Recent Labs    07/06/22 0934  NA 137  K 3.4*  CL 111  CO2 18*  GLUCOSE 133*  BUN 28*  CREATININE 1.50*  CALCIUM 8.7*   LFT No results for input(s): "PROT", "ALBUMIN", "AST", "ALT",  "ALKPHOS", "BILITOT", "BILIDIR", "IBILI" in the last 72 hours. PT/INR No results for input(s): "LABPROT", "INR" in the last 72 hours.  STUDIES: DG Chest Portable 1 View  Result Date: 07/06/2022 CLINICAL DATA:  Chest pain EXAM: PORTABLE CHEST 1 VIEW COMPARISON:  Chest CT 02/07/2022. FINDINGS: The heart is enlarged, unchanged. The upper mediastinal contours are normal. There are small left larger than right pleural effusions with adjacent airspace opacities in the lung bases. The upper lungs well aerated. There is no evidence of overt pulmonary edema. There is no pneumothorax There is no acute osseous abnormality. IMPRESSION: Left larger than right pleural effusions with adjacent airspace opacities which may reflect atelectasis or pneumonia. Electronically Signed   By: Lesia Hausen M.D.   On: 07/06/2022 10:01      Impression    Symptomatic anemia - Hgb 5.0 - Platelets 106 - BUN 28, Cr. 1.50, GFR 44 Symptomatic anemia with melena and history of cancer of ampulla of Vater. With concurrent chest pain and elevated troponin would recommend cardiac workup prior to proceeding with EGD for further evaluation. Melena more progressive since iron therapy initiation, however, cannot rule out UGIB. Also question if bleeding is secondary to radiation  Cancer of ampulla of Vater  -s/p radiation (last dose 04/10/2022)  Elevated troponin/ chest pain Troponin 138   Plan   -Allow for cardiology to clear patient prior to consideration of procedures - EGD at some point for symptomatic anemia - PPI infusion - clear liquids for today, NPO midnight - Hold Eliquis - Continue daily CBC and transfuse as  needed to maintain HGB > 7   Thank you for your kind consultation, we will continue to follow.   Bayley Leanna Sato  07/06/2022, 11:59 AM     Attending physician's note   I have taken history, reviewed the chart and examined the patient. I performed a substantive portion of this encounter, including complete  performance of at least one of the key components, in conjunction with the APP. I agree with the Advanced Practitioner's note, impression and recommendations.   Melena (x 3 months) in pt with ampullary CA (Dx 12/2021) s/p 10Fr plastic stent 12/2021 on XRT. Not a candidate for Whipple's. HD stable. Hb 7.3 (may 2024) to 5 s/p 2U today.   Adm with NSTEMI with elevated troponins  Afib on eliquis (last dose 6/8), anemia of chronic disease d/t CKD3  CT chest Jan 2024 shows "nodular liver". Plts has been low. INR 1.6. No prev liver issues except for fatty liver.   Plan: -IV Protonix -Agree with transfusing 2U.   -Trend CBC, CMP, INR -Hold Eliquis -Appreciate cardiology input.  EGD when cleared by cardiology. Likely 6/11 -[place] abdo in AM for ?Liver cirrhosis -Discussed with pt's daughter and pt in detail    Edman Circle, MD [facility] [phone]

## 2022-07-06 NOTE — H&P (Addendum)
History and Physical   Carl Woods WUJ:[id number] DOB: [birth year] DOA: 07/06/2022  PCP: [name], MD   Patient coming from: Home  Chief Complaint: Chest pain and shortness of breath  HPI: Carl Woods is a 89 y.o. 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  Patient reports month or so of intermittent chest pain and shortness of breath.  This is gradually been increasing and is worse on exertion.  For the past 4 to 5 days he has noticed black stools and for the past several days dizziness.  Symptoms became very severe today so he went to the ED for further evaluation.  He remains on Eliquis.  Does have history of diverticulosis, AVM, sequential dysplasia and cancer of the ampulla Vater as above.  Was due to be scheduled for EGD/ERCP for further evaluation of postradiation results in July.  Denies fevers, chills, abdominal pain, constipation, diarrhea, nausea, vomiting.  ED Course: Vital signs in the ED notable for blood pressure in the 100s to 110s systolic, respiratory rate in the 20s.  Lab workup included BMP with potassium 3.4, bicarb 18, BUN 28, creatinine stable at 1.5, glucose 133, calcium 8.7.  CBC with hemoglobin of 5 down from 7.33 weeks ago at 9.52 months ago.  MCV elevated at 125.  Leukocytosis to 12.3 and platelets 106.  Troponin elevated to 138 and then 247 on repeat.  BNP elevated to 413.  FOBT read as negative but there was a poor sample per EDP.  Patient typed and screened in the ED.  Chest x-ray showing left greater than right pleural effusion with adjacent atelectasis versus pneumonia.  Patient received Lasix, PPI IV followed by PPI gtt., 40 mill equivalents of p.o. potassium.  2 units of packed red blood cells have been ordered for transfusion.  GI  consulted and will see the patient.  Cardiology also consulted recommending a hemoglobin goal of 8 and continue to trend troponins if troponins are not improving with improved hemoglobin or other concerns reconsult.  Review of Systems: As per HPI otherwise all other systems reviewed and are negative.  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   Left Forehead (treatment after biopsy)   OSA on CPAP    Perianal rash    Recurrent epistaxis    Renal cyst 01/04/2013   Small left peripelvic renal cysts , noted on US Renal   SCCA (squamous cell carcinoma) of skin 10/17/2015   Left Sup Bridge of Nose (curet, cautery and 5FU)   Seasonal allergies    Superficial basal cell carcinoma (BCC) 10/17/2015   Left Bulb of Nose (curet, cautery and 5FU)   Tubular adenoma  Past Surgical History:  Procedure Laterality Date   BILIARY STENT PLACEMENT N/A 01/23/2022   Procedure: BILIARY STENT PLACEMENT;  Surgeon: Meridee Score Netty Starring., MD;  Location: [facility];  Service: Gastroenterology;  Laterality: N/A;   BIOPSY  01/23/2022   Procedure: BIOPSY;  Surgeon: Meridee Score Netty Starring., MD;  Location: [facility];  Service: Gastroenterology;;   COLONOSCOPY  multiple   CYSTOSCOPY     ENDOSCOPIC RETROGRADE CHOLANGIOPANCREATOGRAPHY (ERCP) WITH PROPOFOL N/A 01/23/2022   Procedure: ENDOSCOPIC RETROGRADE CHOLANGIOPANCREATOGRAPHY (ERCP) WITH PROPOFOL;  Surgeon:  Lemar Lofty., MD;  Location: [facility];  Service: Gastroenterology;  Laterality: N/A;   ESOPHAGOGASTRODUODENOSCOPY  multiple   ESOPHAGOGASTRODUODENOSCOPY N/A 01/23/2022   Procedure: ESOPHAGOGASTRODUODENOSCOPY (EGD);  Surgeon: Lemar Lofty., MD;  Location: [facility];  Service: Gastroenterology;  Laterality: N/A;   EUS N/A 01/23/2022   Procedure: UPPER ENDOSCOPIC ULTRASOUND (EUS) RADIAL;  Surgeon: Lemar Lofty., MD;  Location: [facility];  Service: Gastroenterology;  Laterality: N/A;   EVALUATION UNDER ANESTHESIA WITH FISTULECTOMY N/A 03/02/2018   Procedure: ANORECTAL EXAM UNDER ANESTHESIA WITH REPAIR OF SUPERFICIAL PERIRECTAL FISTULA AND HEMORRHOIDECTOMY;  Surgeon: [name], MD;  Location: [facility];  Service: General;  Laterality: N/A;   EXPLORATORY LAPAROTOMY     REMOVAL OF STONES  01/23/2022   Procedure: REMOVAL OF STONES;  Surgeon: Lemar Lofty., MD;  Location: [facility];  Service: Gastroenterology;;   Dennison Mascot  01/23/2022   Procedure: Dennison Mascot;  Surgeon: Mansouraty, Netty Starring., MD;  Location: [facility];  Service: Gastroenterology;;    Social History  reports that he quit smoking about 18 years ago. His smoking use included cigarettes. He has a 20.00 pack-year smoking history. He has never used smokeless tobacco. He reports current alcohol use. He reports that he does not use drugs.  No Known Allergies  Family History  Problem Relation Age of Onset   Heart disease Mother    Other Father        old age   Ovarian cancer Sister    Colon cancer Neg Hx    Stomach cancer Neg Hx    Rectal cancer Neg Hx    Esophageal cancer Neg Hx    Liver cancer Neg Hx   Reviewed on admission  Prior to Admission medications   Medication Sig Start Date End Date Taking? Authorizing Provider  apixaban (ELIQUIS) 5 MG TABS tablet Take 1 tablet (5 mg total) by mouth 2 (two) times daily. 01/25/22   Mansouraty, Netty Starring., MD   Cholecalciferol (VITAMIN D3) 5000 units CAPS Take 1 capsule by mouth every evening.    [provider]  dapagliflozin propanediol (FARXIGA) 10 MG TABS tablet Take 10 mg by mouth in the morning. 05/29/20   [provider]  Fe Fum-Fe Poly-Vit C-Lactobac (FUSION) 65-65-25-30 MG CAPS Take 1 capsule by mouth 3 (three) times a week.    [provider]  fexofenadine (ALLEGRA) 180 MG tablet Take 180 mg by mouth as needed for allergies or rhinitis.    [provider]  furosemide (LASIX) 20 MG tablet Take 20 mg by mouth in the morning. 02/04/16   [provider]  Glucosamine Sulfate 500 MG TABS Take by mouth. 07/08/16   [provider]  Glucosamine-Chondroit-Vit C-Mn (GLUCOSAMINE CHONDR 500 COMPLEX PO) Take 1 tablet by mouth daily.     [provider]  ketoconazole (NIZORAL) 2 % cream Apply 1 Application topically 2 (two) times daily as needed (skin rash/irritation.).    [provider]  levothyroxine (SYNTHROID, LEVOTHROID) 112 MCG tablet  Take 112 mcg by mouth daily before breakfast.  02/11/16   [provider]  losartan (COZAAR) 25 MG tablet Take 25 mg by mouth in the morning. 02/05/16   [provider]  metoprolol succinate (TOPROL-XL) 25 MG 24 hr tablet TAKE 1 TABLET (25 MG TOTAL) BY MOUTH DAILY. TAKE WITH OR IMMEDIATELY FOLLOWING A MEAL. 05/06/21 06/19/22  [name], MD  nitroGLYCERIN (NITROSTAT) 0.4 MG SL tablet Place 1 tablet (0.4 mg total) under the tongue every 5 (five) minutes as needed for up to 25 days for chest pain. 01/31/20 01/18/24  [name], MD  olopatadine (PATANOL) 0.1 % ophthalmic solution Place 1 drop into both eyes 2 (two) times daily. 07/08/16   [provider]  omeprazole (PRILOSEC) 20 MG capsule Take 1 capsule (20 mg total) by mouth 2 (two) times daily before a meal. Twice daily for 1 month.  Then return back to daily dosing. 01/23/22   Mansouraty, Netty Starring., MD  sodium chloride (OCEAN) 0.65 %  SOLN nasal spray Place 1 spray into both nostrils as needed for congestion.    [provider]    Physical Exam: Vitals:   07/06/22 1136 07/06/22 1151 07/06/22 1200 07/06/22 1215  BP: (!) 121/59 (!) 117/48 (!) 111/53 (!) 108/49  Pulse:  87 86 96  Resp:  (!) 24 (!) 24 20  Temp:  97.9 F (36.6 C)    TempSrc:      SpO2:  99% 100% 100%  Weight:      Height:        Physical Exam Constitutional:      General: He is not in acute distress.    Appearance: Normal appearance.  HENT:     Head: Normocephalic and atraumatic.     Mouth/Throat:     Mouth: Mucous membranes are moist.     Pharynx: Oropharynx is clear.  Eyes:     Extraocular Movements: Extraocular movements intact.     Pupils: Pupils are equal, round, and reactive to light.  Cardiovascular:     Rate and Rhythm: Normal rate and regular rhythm.     Pulses: Normal pulses.     Heart sounds: Normal heart sounds.  Pulmonary:     Effort: Pulmonary effort is normal. No respiratory distress.     Breath sounds: Examination of the right-lower field reveals decreased breath sounds. Examination of the left-lower field reveals decreased breath sounds. Decreased breath sounds present.  Abdominal:     General: Bowel sounds are normal. There is no distension.     Palpations: Abdomen is soft.     Tenderness: There is no abdominal tenderness.  Musculoskeletal:        General: No swelling or deformity.     Right lower leg: Edema present.     Left lower leg: Edema present.  Skin:    General: Skin is warm and dry.     Findings: Rash (Left Knee) present.  Neurological:     General: No focal deficit present.     Mental Status: Mental status is at baseline.    Labs on Admission: I have personally reviewed following labs and imaging studies  CBC: Recent Labs  Lab 07/06/22 0934  WBC 12.3*  HGB 5.0*  HCT 16.4*  MCV 125.2*  PLT 106*    Basic Metabolic Panel: Recent Labs  Lab 07/06/22 0934  NA 137  K 3.4*  CL 111  CO2  18*  GLUCOSE 133*  BUN 28*  CREATININE 1.50*  CALCIUM 8.7*  GFR: Estimated Creatinine Clearance: 38.3 mL/min (A) (by C-G formula based on SCr of 1.5 mg/dL (H)).  Liver Function Tests: No results for input(s): "AST", "ALT", "ALKPHOS", "BILITOT", "PROT", "ALBUMIN" in the last 168 hours.  Urine analysis: No results found for: "COLORURINE", "APPEARANCEUR", "LABSPEC", "PHURINE", "GLUCOSEU", "HGBUR", "BILIRUBINUR", "KETONESUR", "PROTEINUR", "UROBILINOGEN", "NITRITE", "LEUKOCYTESUR"  Radiological Exams on Admission: DG Chest Portable 1 View  Result Date: 07/06/2022 CLINICAL DATA:  Chest pain EXAM: PORTABLE CHEST 1 VIEW COMPARISON:  Chest CT 02/07/2022. FINDINGS: The heart is enlarged, unchanged. The upper mediastinal contours are normal. There are small left larger than right pleural effusions with adjacent airspace opacities in the lung bases. The upper lungs well aerated. There is no evidence of overt pulmonary edema. There is no pneumothorax There is no acute osseous abnormality. IMPRESSION: Left larger than right pleural effusions with adjacent airspace opacities which may reflect atelectasis or pneumonia. Electronically Signed   By: Lesia Hausen M.D.   On: 07/06/2022 10:01    EKG: Independently reviewed.  Atrial fibrillation at 92 bpm.  PVC noted.  Some nonspecific T wave flattening.  ?ST depression borderline in V4 through V6.  Assessment/Plan Principal Problem:   Symptomatic anemia Active Problems:   Iron deficiency anemia due to chronic blood loss   Atrial fibrillation (HCC)   Cancer of ampulla of Vater (HCC)   NSTEMI (non-ST elevated myocardial infarction) (HCC)   Elevated brain natriuretic peptide (BNP) level   Pleural effusion   Hypokalemia   Hypothyroidism   Symptomatic anemia GI bleed suspected > Patient presenting with worsening chest pain and shortness of breath especially on exertion.  This has been intermittently going on for a month or so with has been gradually  worsening and then severe today. > Noted to have hemoglobin of 5 in the ED, down from 7.33 weeks ago at 9.52 weeks ago.  Macrocytic anemia. > History of ampulla of Vater cancer s/p radiation, GI AVMs, cecal angiodysplasia, diverticulosis. > FOBT was negative but there was a poor sample so this is believed to be a false negative. > Received IV PPI in the ED and started on a PPI drip.  2 units ordered for transfusion.  Goal hemoglobin of 8 based on cardiology recommendations as below. > GI consulted in the ED and will see the patient. - Monitor in progressive overnight - Appreciate GI recommendations - Continue with 2 units transfusion - Check iron, ferritin, reticulocytes, B12, folate - Trend hemoglobin, recheck after initial 2 unit transfusion, goal hemoglobin 8  NSTEMI Elevated BNP Pleural Effusion > Patient presenting with chest pain and shortness of breath as above believed to be symptomatic anemia. > Was additionally noted to have troponin elevated to 138 and then increased to 247 on repeat.  BNP elevated to 413. > No prior history of CAD or CHF.  Last echo was in 2023 and showed EF of 55-60%, indeterminate diastolic function, resolution of previous pericardial effusion. > EDP reported patient did appear volume overloaded and has ordered a dose of Lasix prior to transfusion. CXR with pleural effusions. > Cardiology also consulted in ED - Monitoring on progressive overnight as above - Appreciate cardiology recommendations - Continue to trend troponin - EKG as needed - Echocardiogram - In's and outs, daily weights - Goal hemoglobin of 8 as above  Hypokalemia > Mild hypokalemia in the ED with potassium of 3.4. - Will continue to trend potassium, will plan to replete p.o. versus IV depending on GI recommendations - Check magnesium  Atrial fibrillation -Continue home  metoprolol - Holding home Eliquis given active bleed as above  Hypothyroidism - Continue home Synthroid  GERD -  On IV PPI  OSA - CPAP  Cancer of the ampulla of Vater >  Status post radiation, plan for ERCP in July to evaluate status after radiation. - Appreciate any GI recommendations regarding this - Continue to monitor   DVT prophylaxis: SCDs Code Status:   Full Family Communication:  Updated at bedside Disposition Plan:   Patient is from:  Home  Anticipated DC to:  Home  Anticipated DC date:  2 to 4 days  Anticipated DC barriers: None  Consults called:  Gastroenterology, cardiology Admission status:  Inpatient, progressive  Severity of Illness: The appropriate patient status for this patient is INPATIENT. Inpatient status is judged to be reasonable and necessary in order to provide the required intensity of service to ensure the patient's safety. The patient's presenting symptoms, physical exam findings, and initial radiographic and laboratory data in the context of their chronic comorbidities is felt to place them at high risk for further clinical deterioration. Furthermore, it is not anticipated that the patient will be medically stable for discharge from the hospital within 2 midnights of admission.   * I certify that at the point of admission it is my clinical judgment that the patient will require inpatient hospital care spanning beyond 2 midnights from the point of admission due to high intensity of service, high risk for further deterioration and high frequency of surveillance required.Synetta Fail MD [facility]  How to contact the [facility] Attending or Consulting provider 7A - 7P or covering provider during after hours 7P -7A, for this patient?   Check the care team in [facility] and look for a) attending/consulting TRH provider listed and b) the [facility] team listed Log into [email or web address] and use [place]'s universal password to access. If you do not have the password, please contact the hospital operator. Locate the [facility] provider you are looking for under [facility]  and page to a number that you can be directly reached. If you still have difficulty reaching the provider, please page the [facility] (Director on Call) for the Hospitalists listed on amion for assistance.  07/06/2022, 1:03 PM

## 2022-07-06 NOTE — ED Notes (Signed)
CRITICAL VALUE STICKER  CRITICAL VALUE:Troponin 138  RECEIVER (on-site recipient of call):I. Clotilda Hafer  DATE & TIME NOTIFIED: 07/06/22 1044  MESSENGER (representative from lab):lab  MD NOTIFIED: Eloise Harman  TIME OF NOTIFICATION:1045  RESPONSE:

## 2022-07-07 ENCOUNTER — Inpatient Hospital Stay (HOSPITAL_COMMUNITY): Payer: PPO

## 2022-07-07 ENCOUNTER — Inpatient Hospital Stay: Payer: PPO

## 2022-07-07 ENCOUNTER — Inpatient Hospital Stay: Payer: PPO | Admitting: Hematology

## 2022-07-07 DIAGNOSIS — I4891 Unspecified atrial fibrillation: Secondary | ICD-10-CM | POA: Diagnosis not present

## 2022-07-07 DIAGNOSIS — K921 Melena: Secondary | ICD-10-CM

## 2022-07-07 DIAGNOSIS — C241 Malignant neoplasm of ampulla of Vater: Secondary | ICD-10-CM | POA: Diagnosis not present

## 2022-07-07 DIAGNOSIS — D649 Anemia, unspecified: Secondary | ICD-10-CM | POA: Diagnosis not present

## 2022-07-07 DIAGNOSIS — Z7901 Long term (current) use of anticoagulants: Secondary | ICD-10-CM

## 2022-07-07 DIAGNOSIS — I5031 Acute diastolic (congestive) heart failure: Secondary | ICD-10-CM

## 2022-07-07 DIAGNOSIS — Z01810 Encounter for preprocedural cardiovascular examination: Secondary | ICD-10-CM

## 2022-07-07 LAB — ECHOCARDIOGRAM COMPLETE
AR max vel: 3.24 cm2
AV Area VTI: 3.33 cm2
AV Area mean vel: 3.09 cm2
AV Mean grad: 4.3 mmHg
AV Peak grad: 8 mmHg
Ao pk vel: 1.42 m/s
Area-P 1/2: 3.3 cm2
Calc EF: 67.7 %
Height: 68 in
MV VTI: 2.57 cm2
S' Lateral: 2.9 cm
Single Plane A2C EF: 67.9 %
Single Plane A4C EF: 66.5 %
Weight: 3449.76 oz

## 2022-07-07 LAB — COMPREHENSIVE METABOLIC PANEL
ALT: 13 U/L (ref 0–44)
AST: 23 U/L (ref 15–41)
Albumin: 2.3 g/dL — ABNORMAL LOW (ref 3.5–5.0)
Alkaline Phosphatase: 51 U/L (ref 38–126)
Anion gap: 8 (ref 5–15)
BUN: 29 mg/dL — ABNORMAL HIGH (ref 8–23)
CO2: 22 mmol/L (ref 22–32)
Calcium: 8.2 mg/dL — ABNORMAL LOW (ref 8.9–10.3)
Chloride: 108 mmol/L (ref 98–111)
Creatinine, Ser: 1.7 mg/dL — ABNORMAL HIGH (ref 0.61–1.24)
GFR, Estimated: 38 mL/min — ABNORMAL LOW (ref >60)
Glucose, Bld: 108 mg/dL — ABNORMAL HIGH (ref 70–99)
Potassium: 3.3 mmol/L — ABNORMAL LOW (ref 3.5–5.1)
Sodium: 138 mmol/L (ref 135–145)
Total Bilirubin: 1.1 mg/dL (ref 0.3–1.2)
Total Protein: 5.1 g/dL — ABNORMAL LOW (ref 6.5–8.1)

## 2022-07-07 LAB — HEPATITIS C ANTIBODY: HCV Ab: NONREACTIVE

## 2022-07-07 LAB — HEPATITIS B CORE ANTIBODY, IGM: Hep B C IgM: NONREACTIVE

## 2022-07-07 LAB — CBC
HCT: 20.1 % — ABNORMAL LOW (ref 39.0–52.0)
Hemoglobin: 6.5 g/dL — CL (ref 13.0–17.0)
MCH: 34.8 pg — ABNORMAL HIGH (ref 26.0–34.0)
MCHC: 32.3 g/dL (ref 30.0–36.0)
MCV: 107.5 fL — ABNORMAL HIGH (ref 80.0–100.0)
Platelets: 102 10*3/uL — ABNORMAL LOW (ref 150–400)
RBC: 1.87 MIL/uL — ABNORMAL LOW (ref 4.22–5.81)
RDW: 28.7 % — ABNORMAL HIGH (ref 11.5–15.5)
WBC: 8.3 10*3/uL (ref 4.0–10.5)
nRBC: 1.3 % — ABNORMAL HIGH (ref 0.0–0.2)

## 2022-07-07 LAB — TROPONIN I (HIGH SENSITIVITY)
Troponin I (High Sensitivity): 859 ng/L (ref <18)
Troponin I (High Sensitivity): 740 ng/L (ref <18)

## 2022-07-07 LAB — HEMOGLOBIN AND HEMATOCRIT, BLOOD
HCT: 26.9 % — ABNORMAL LOW (ref 39.0–52.0)
Hemoglobin: 8.7 g/dL — ABNORMAL LOW (ref 13.0–17.0)

## 2022-07-07 LAB — PREPARE RBC (CROSSMATCH)

## 2022-07-07 LAB — TYPE AND SCREEN

## 2022-07-07 LAB — BPAM RBC
Blood Product Expiration Date: 202407062359
Unit Type and Rh: 600
Unit Type and Rh: 600

## 2022-07-07 LAB — HEPATITIS B SURFACE ANTIGEN: Hepatitis B Surface Ag: NONREACTIVE

## 2022-07-07 LAB — HEPATITIS A ANTIBODY, IGM: Hep A IgM: NONREACTIVE

## 2022-07-07 MED ORDER — SODIUM CHLORIDE 0.9% IV SOLUTION: Freq: Once | INTRAVENOUS | Status: AC

## 2022-07-07 MED ORDER — IOHEXOL 9 MG/ML PO SOLN
500.0000 mL | ORAL | Status: AC
  Administered 2022-07-07 (×2): 500 mL via ORAL

## 2022-07-07 MED ORDER — IOHEXOL 350 MG/ML SOLN
60.0000 mL | Freq: Once | INTRAVENOUS | Status: AC | PRN
  Administered 2022-07-07: 60 mL via INTRAVENOUS

## 2022-07-07 MED ORDER — POTASSIUM CHLORIDE 20 MEQ PO PACK
40.0000 meq | PACK | ORAL | Status: AC
  Administered 2022-07-07 (×2): 40 meq via ORAL
  Filled 2022-07-07 (×2): qty 2

## 2022-07-07 MED ORDER — PERFLUTREN LIPID MICROSPHERE
1.0000 mL | INTRAVENOUS | Status: AC | PRN
  Administered 2022-07-07: 3 mL via INTRAVENOUS

## 2022-07-07 NOTE — Progress Notes (Addendum)
ON-CALL CARDIOLOGY 07/07/22  Patient's name: Carl Woods.   MRN: [id number].    DOB: 1931-10-13 Primary care provider: [name], MD. Primary cardiologist: Dr. Jacinto Halim  Interaction regarding this patient's care today: RN called to review his most recent hs troponin 859.   She was not aware in what clinical context this was checked - not signed out to her in report.   Recommendations: Recommended talking to the patient regarding any symptoms.  Check EKG.  She will call us back.   [name] [facility]  Pager:  [phone] Office: [phone] 9:20 PM 07/07/22  Update:  RN called notifying that patient is not having CP or Shortness of breath.  Resting in bed.  Not sure why the troponins were checked.  EKG: July 07, 2022: Atrial fibrillation, 84 bpm, nonspecific ST-T changes, without underlying injury pattern, low voltage.  Recommendation: Stop checking hs troponin in asymptomatic patient.  Troponin leak likely due to severe symptomatic anemia.  Call if change in clinical status.  I have asked the RN (Ms. Eduardo Osier) to update the records.   [name] [facility]  Pager:  [phone] Office: [phone] 11:00 PM 07/07/22

## 2022-07-07 NOTE — TOC Initial Note (Addendum)
Transition of Care [facility]) - Initial/Assessment Note    Patient Details  Name: Carl Woods MRN: [id number] Date of Birth: Jul 24, 1931  [facility] CM/SW Contact:    [name], LCSWA Phone Number: 07/07/2022, 4:47 PM  Clinical Narrative:                  CSW received consult for possible SNF placement at time of discharge. CSW spoke with patient at bedside regarding PT recommendation of SNF placement at time of discharge. Patient reports PTA he comes from home with spouse.Patient expressed understanding of PT recommendation and is currently agreeable for CSW to fax out for SNF placement at time of discharge.Patient reports he would like to see what SNF options are but may change his mind closer to dc to return home if he feels he is able.Patient reports he has support from two sons and daughter if needed.Patient gave CSW permission to fax out initial referral for SNF placement.CSW discussed insurance authorization process and will provide Medicare SNF ratings list with accepted SNF bed offers when available. No further questions reported at this time. CSW to continue to follow and assist with discharge planning needs.   Expected Discharge Plan: Home w Home Health Services Barriers to Discharge: Continued Medical Work up   Patient Goals and CMS Choice Patient states their goals for this hospitalization and ongoing recovery are:: To go home          Expected Discharge Plan and Services   Discharge Planning Services: CM Consult   Living arrangements for the past 2 months: Single Family Home                                      Prior Living Arrangements/Services Living arrangements for the past 2 months: Single Family Home Lives with:: Spouse (Spouse has dementia) Patient language and need for interpreter reviewed:: Yes Do you feel safe going back to the place where you live?: Yes      Need for Family Participation in Patient Care: Yes (Comment) Care giver  support system in place?: Yes (comment) Current home services: DME (CPAP-Rest Med) Criminal Activity/Legal Involvement Pertinent to Current Situation/Hospitalization: No - Comment as needed  Activities of Daily Living Home Assistive Devices/Equipment: None ADL Screening (condition at time of admission) Patient's cognitive ability adequate to safely complete daily activities?: Yes Is the patient deaf or have difficulty hearing?: No Does the patient have difficulty seeing, even when wearing glasses/contacts?: No Does the patient have difficulty concentrating, remembering, or making decisions?: No Patient able to express need for assistance with ADLs?: Yes Does the patient have difficulty dressing or bathing?: No Independently performs ADLs?: Yes (appropriate for developmental age) Does the patient have difficulty walking or climbing stairs?: Yes Weakness of Legs: Both Weakness of Arms/Hands: Both  Permission Sought/Granted                  Emotional Assessment Appearance:: Appears stated age Attitude/Demeanor/Rapport: Engaged Affect (typically observed): Appropriate Orientation: : Oriented to Self, Oriented to Place, Oriented to  Time, Oriented to Situation Alcohol / Substance Use: Not Applicable Psych Involvement: No (comment)  Admission diagnosis:  Symptomatic anemia [D64.9] Patient Active Problem List   Diagnosis Date Noted   Acute heart failure with preserved ejection fraction (HFpEF) (HCC) 07/07/2022   Preprocedural cardiovascular examination 07/07/2022   Symptomatic anemia 07/06/2022   Non-ST elevation (NSTEMI) myocardial infarction (HCC) 07/06/2022  Elevated brain natriuretic peptide (BNP) level 07/06/2022   Pleural effusion 07/06/2022   Hypokalemia 07/06/2022   Hypothyroidism 07/06/2022   GERD (gastroesophageal reflux disease) 07/06/2022   Shortness of breath 07/06/2022   Hypervolemia 07/06/2022   AKI (acute kidney injury) (HCC) 07/06/2022   Vitamin B12  deficiency anemia 06/16/2022   Cancer of ampulla of Vater (HCC) 01/31/2022   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   Perianal rash 08/06/2017   Atrial fibrillation (HCC) 07/08/2016   Recurrent epistaxis 07/08/2016   Deviated nasal septum 07/08/2016   Cecal angiodysplasia 05/28/2016   Iron deficiency anemia due to chronic blood loss 04/23/2016   Heme positive stool 04/23/2016   Long term (current) use of anticoagulants 04/23/2016   BARRETTS ESOPHAGUS 04/23/2009   History of colonic polyps 04/23/2009   PCP:  [name], MD Pharmacy:   [facility] - [place], [place] - [address] [place] [address] Phone: [phone] Fax: [phone]     Social Determinants of Health (SDOH) Social History: SDOH Screenings   Food Insecurity: No Food Insecurity (02/18/2022)  Housing: Low Risk  (02/18/2022)  Transportation Needs: No Transportation Needs (02/18/2022)  Utilities: Not At Risk (02/18/2022)  Depression (PHQ2-9): Low Risk  (02/18/2022)  Tobacco Use: Medium Risk (07/06/2022)   SDOH Interventions:     Readmission Risk Interventions    07/07/2022    1:52 PM  Readmission Risk Prevention Plan  Transportation Screening Complete  PCP or Specialist Appt within 3-5 Days Complete  HRI or Home Care Consult Complete  Social Work Consult for Recovery Care Planning/Counseling Complete  Palliative Care Screening Not Applicable  Medication Review Oceanographer) Complete

## 2022-07-07 NOTE — Progress Notes (Signed)
   07/07/22 0951  Spiritual Encounters  Type of Visit Follow up  Care provided to: Patient  Conversation partners present during encounter Nurse  Reason for visit Advance directives  OnCall Visit No   Chaplain f/u with PT today and HCPOA paperwork was filled out and ready.  Chaplain acquired notary to sign and then filed the appropriate paperwork in the appropriate places.

## 2022-07-07 NOTE — Progress Notes (Signed)
Heart Failure Navigator Progress Note  Assessed for Heart & Vascular TOC clinic readiness.  Patient does not meet criteria due to established with [facility] cardiology.   [place] Bueche Boothby, PharmD, BCPS Heart Failure Stewardship Pharmacist Phone [phone]  

## 2022-07-07 NOTE — Progress Notes (Signed)
Dr. Tessa Lerner was made aware that pt's trop was 859 (at 20:52 hrs), but pt said he had no c/o CP today and was asymptomatic. EGK was done and viewed by Dr. Tessa Lerner.

## 2022-07-07 NOTE — Assessment & Plan Note (Deleted)
diagnosed in 12/2021 -I reviewed PET scan findings, which showed no evidence of distant metastasis  -He was evaluated by pancreatobiliary surgeon Dr. Donell Beers, and felt to be a poor candidate for Whipple surgery. -Patient has decided to proceed with radiation, I discussed the option of concurrent chemotherapy with Xeloda as a radiation sensitizer.  Potential benefit and side effect discussed with patient.  Patient has chronic kidney disease, we will repeat labs today to see if he is a candidate for Xeloda with dose reduction. -Her GFR today is 33, this is borderline for Xeloda.  Given his advanced age, relatively very early stage disease, and the limited benefit of concurrent chemotherapy, I do not recommend the Xeloda or 5-fu.  He will proceed with radiation alone. -I reviewed his molecular testing, MMR was normal, he is not a candidate for immunotherapy.  Foundation One showed no targetable mutations. -He is finishing RT on 04/09/2022 -Due to advanced age, I do not plan to offer him chemotherapy -We discussed posttreatment evaluation by EGD, or scan such as MRI or PET scan -he is due for EGD and stent replacement

## 2022-07-07 NOTE — Evaluation (Signed)
Physical Therapy Evaluation Patient Details Name: Carl Woods MRN: [id number] DOB: [birth year] Today's Date: 07/07/2022  History of Present Illness  Patient is 89 y.o. male who presents with a chief complaint of chest pain and shortness of breath. Cardiology consulted and feels NSTEMI likely secondary to supply demand ischemia/symptomatic anemia. 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   Clinical Impression  Carl Woods is 89 y.o. male admitted with above HPI and diagnosis. Patient is currently limited by functional impairments below (see PT problem list). Patient lives with spouse and is her caregiver due to her dementia and was independent with mobility but recently has become limited and has been using an office chair to scoot around the house and now a rollator. Currently pt was able to sit up EOB with Mod assist and completed sit<>stand with min assist to rise and take small steps EOB with RW. Patient will benefit from continued skilled PT interventions to address impairments and progress independence with mobility. Acute PT will follow and progress as able.        Recommendations for follow up therapy are one component of a multi-disciplinary discharge planning process, led by the attending physician.  Recommendations may be updated based on patient status, additional functional criteria and insurance authorization.  Follow Up Recommendations Can patient physically be transported by private vehicle: Yes     Assistance Recommended at Discharge Frequent or constant Supervision/Assistance  Patient can return home with the following  A little help with walking and/or transfers;A little help with bathing/dressing/bathroom;Assistance with cooking/housework;Direct supervision/assist for medications management;Assist for transportation;Help with  stairs or ramp for entrance    Equipment Recommendations None recommended by PT  Recommendations for Other Services       Functional Status Assessment Patient has had a recent decline in their functional status and demonstrates the ability to make significant improvements in function in a reasonable and predictable amount of time.     Precautions / Restrictions Precautions Precautions: Fall Restrictions Weight Bearing Restrictions: No      Mobility  Bed Mobility Overal bed mobility: Needs Assistance Bed Mobility: Supine to Sit, Sit to Supine     Supine to sit: HOB elevated, Mod assist Sit to supine: Min assist   General bed mobility comments: cues to sequence use of bed rail and bringing LE's off EOB, min assist to pivot hips and raise trunk. Mod assist to scoot to EOB for feet on floor with use of bed pad. min assist to bring Le's into bed at EOS.    Transfers Overall transfer level: Needs assistance Equipment used: Rolling walker (2 wheels) Transfers: Sit to/from Stand Sit to Stand: Min assist           General transfer comment: min assist with cues for single UE press up from EOB to stand to RW. pt demonstrates good reach back to sit back on bed.    Ambulation/Gait             Pre-gait activities: side steps along EOB with RW, to move towards HOB, min assist to guide walker and cues for posture    Stairs            Wheelchair Mobility    Modified Rankin (Stroke Patients Only)       Balance Overall balance assessment: Needs assistance, History of Falls  Pertinent Vitals/Pain Pain Assessment Pain Assessment: No/denies pain    Home Living Family/patient expects to be discharged to:: Private residence Living Arrangements: Spouse/significant other Available Help at Discharge: Family Type of Home: House Home Access: Stairs to enter   Secretary/administrator of Steps: 1+1   Home  Layout: One level;Able to live on main level with bedroom/bathroom;Full bath on main level Home Equipment: Shower seat;Grab bars - tub/shower Additional Comments: pt's wife Okey Regal has dementia, pt's son lives in [place] part time and pt has a daughter in [place]. States his kids are all helping with pt's wife and staying overnight.    Prior Function Prior Level of Function : Independent/Modified Independent             Mobility Comments: has been rolling around in office chair but now has sister in laws rollator to mobilize and sits on it and scoots with use of Bil LE's, occasionally walks short distances with it. ADLs Comments: reports was independent with everything, dressing/bathing and homemaking.     Hand Dominance   Dominant Hand: Left    Extremity/Trunk Assessment   Upper Extremity Assessment Upper Extremity Assessment: Generalized weakness    Lower Extremity Assessment Lower Extremity Assessment: Generalized weakness    Cervical / Trunk Assessment Cervical / Trunk Assessment: Kyphotic  Communication   Communication: No difficulties  Cognition Arousal/Alertness: Awake/alert Behavior During Therapy: WFL for tasks assessed/performed Overall Cognitive Status: Within Functional Limits for tasks assessed                                          General Comments      Exercises     Assessment/Plan    PT Assessment Patient needs continued PT services  PT Problem List Decreased strength;Decreased activity tolerance;Decreased balance;Decreased mobility;Decreased knowledge of use of DME;Decreased safety awareness;Decreased knowledge of precautions;Cardiopulmonary status limiting activity;Obesity       PT Treatment Interventions DME instruction;Gait training;Stair training;Functional mobility training;Therapeutic activities;Therapeutic exercise;Balance training;Neuromuscular re-education;Cognitive remediation;Patient/family education;Wheelchair mobility  training    PT Goals (Current goals can be found in the Care Plan section)  Acute Rehab PT Goals Patient Stated Goal: get stronger PT Goal Formulation: With patient Time For Goal Achievement: 07/21/22 Potential to Achieve Goals: Good    Frequency Min 1X/week     Co-evaluation               AM-PAC PT "6 Clicks" Mobility  Outcome Measure Help needed turning from your back to your side while in a flat bed without using bedrails?: A Little Help needed moving from lying on your back to sitting on the side of a flat bed without using bedrails?: A Little Help needed moving to and from a bed to a chair (including a wheelchair)?: A Little Help needed standing up from a chair using your arms (e.g., wheelchair or bedside chair)?: A Little Help needed to walk in hospital room?: A Lot Help needed climbing 3-5 steps with a railing? : Total 6 Click Score: 15    End of Session Equipment Utilized During Treatment: Gait belt Activity Tolerance: Patient tolerated treatment well Patient left: in bed;with call bell/phone within reach;with bed alarm set;with SCD's reapplied Nurse Communication: Mobility status PT Visit Diagnosis: Muscle weakness (generalized) (M62.81);Difficulty in walking, not elsewhere classified (R26.2);Other abnormalities of gait and mobility (R26.89)    Time: 6644-0347 PT Time Calculation (min) (ACUTE ONLY): 30 min   Charges:  PT Evaluation $PT Eval Moderate Complexity: 1 Mod PT Treatments $Therapeutic Activity: 8-22 mins        Wynn Maudlin, DPT Acute Rehabilitation Services Office [phone]  07/07/22 2:32 PM

## 2022-07-07 NOTE — Progress Notes (Signed)
Progress Note  Patient Name: Carl Woods MRN: [id number] DOB: 07/18/1931 Date of Encounter: 07/07/2022  Attending physician: Barnetta Chapel, MD Primary care provider: [name], MD Primary Cardiologist: Dr. [name]  Subjective: Carl Woods is a 89 y.o. Caucasian male who was seen and examined at bedside  No chest pain Dyspnea with ambulation and at times typically. About to receive his fourth PRBC No family present at bedside Case discussed and reviewed with his nurse.  Objective: Vital Signs in the last 24 hours: Temp:  [97.8 F (36.6 C)-99.9 F (37.7 C)] 98.8 F (37.1 C) (06/10 1000) Pulse Rate:  [53-104] 93 (06/10 1000) Resp:  [17-31] 21 (06/10 1000) BP: (98-122)/(42-67) 106/54 (06/10 1000) SpO2:  [97 %-100 %] 99 % (06/10 1000) Weight:  [97.8 kg-99.8 kg] 97.8 kg (06/10 0447)  Intake/Output:  Intake/Output Summary (Last 24 hours) at 07/07/2022 1019 Last data filed at 07/07/2022 0905 Gross per 24 hour  Intake 300 ml  Output 4100 ml  Net -3800 ml    Net IO Since Admission: -3,800 mL [07/07/22 1019]  Weights:     07/07/2022    4:47 AM 07/06/2022   10:31 AM 06/19/2022   10:08 AM  Last 3 Weights  Weight (lbs) 215 lb 9.8 oz 220 lb 225 lb  Weight (kg) 97.8 kg 99.791 kg 102.059 kg      Telemetry:  Overnight telemetry shows A-fib with controlled ventricular rate, which I personally reviewed.   Physical examination: PHYSICAL EXAM: Vitals:   07/07/22 0905 07/07/22 0939 07/07/22 0945 07/07/22 1000  BP: (!) 103/44 (!) 112/46  (!) 106/54  Pulse:  76 78 93  Resp: (!) 31 (!) 26 (!) 26 (!) 21  Temp: 98.5 F (36.9 C) 98.7 F (37.1 C)  98.8 F (37.1 C)  TempSrc:  Oral  Oral  SpO2: 97% 99% 98% 99%  Weight:      Height:        Physical Exam  Constitutional: No distress. He appears chronically ill.  Older than stated age, hemodynamically stable, pale  HENT:  Poor dentition.  Neck: No JVD present.  Cardiovascular: Normal rate, regular rhythm, S1  normal, S2 normal, intact distal pulses and normal pulses. Exam reveals no gallop, no S3 and no S4.  No murmur heard. Pulmonary/Chest: No stridor. He has no wheezes. He has bibasilar rales.  Decreased breath sounds likely secondary to reduced effort  Abdominal: Soft. Bowel sounds are normal. He exhibits no distension. There is no abdominal tenderness.  Musculoskeletal:     Cervical back: Neck supple.     Comments: SCDs in place, bilateral lower extremity swelling up to the mid thighs laterally, decreased range of motion  Neurological: He is alert and oriented to person, place, and time. He has intact cranial nerves (2-12).  Skin: Skin is warm and moist. Rash (Around the left knee.) noted.   Lab Results: Chemistry Recent Labs  Lab 07/06/22 0934 07/06/22 2100 07/07/22 0304  NA 137 139 138  K 3.4* 3.4* 3.3*  CL 111 108 108  CO2 18* 20* 22  GLUCOSE 133* 135* 108*  BUN 28* 28* 29*  CREATININE 1.50* 1.74* 1.70*  CALCIUM 8.7* 8.6* 8.2*  PROT  --  5.8* 5.1*  ALBUMIN  --  2.7* 2.3*  AST  --  28 23  ALT  --  14 13  ALKPHOS  --  54 51  BILITOT  --  1.4* 1.1  GFRNONAA 44* 37* 38*  ANIONGAP 8 11 8  Hematology Recent Labs  Lab 07/06/22 0934 07/06/22 2057 07/06/22 2100 07/07/22 0304  WBC 12.3*  --  10.8* 8.3  RBC 1.31* 2.01* 2.06* 1.87*  HGB 5.0*  --  7.0* 6.5*  HCT 16.4*  --  22.1* 20.1*  MCV 125.2*  --  107.3* 107.5*  MCH 38.2*  --  34.0 34.8*  MCHC 30.5  --  31.7 32.3  RDW 24.4*  --  27.9* 28.7*  PLT 106*  --  93* 102*   High Sensitivity Troponin:   Recent Labs  Lab 07/06/22 0934 07/06/22 1101  TROPONINIHS 138* 247*     Cardiac EnzymesNo results for input(s): "TROPONINI" in the last 168 hours. No results for input(s): "TROPIPOC" in the last 168 hours.  BNP Recent Labs  Lab 07/06/22 0936  BNP 413.5*    DDimer No results for input(s): "DDIMER" in the last 168 hours.  Hemoglobin A1c: No results found for: "HGBA1C", "MPG" TSH No results for input(s): "TSH" in the  last 8760 hours. Lipid Panel No results found for: "CHOL", "HDL", "LDLCALC", "LDLDIRECT", "TRIG", "CHOLHDL" Drugs of Abuse  No results found for: "LABOPIA", "COCAINSCRNUR", "LABBENZ", "AMPHETMU", "THCU", "LABBARB"    Imaging: ECHOCARDIOGRAM COMPLETE  Result Date: 07/07/2022    ECHOCARDIOGRAM REPORT   Patient Name:   Carl Woods Date of Exam: 07/07/2022 Medical Rec #:  [id number]    Height:       68.0 in Accession #:    [id number]   Weight:       215.6 lb Date of Birth:  16-Dec-1931    BSA:          2.110 m Patient Age:    89 years     BP:           104/52 mmHg Patient Gender: M            HR:           74 bpm. Exam Location:  Inpatient Procedure: 2D Echo, Cardiac Doppler, Color Doppler and Intracardiac            Opacification Agent Indications:    NSTEMI, Chest Pain  History:        Patient has prior history of Echocardiogram examinations, most                 recent 06/18/2021. Cancer, Arrythmias:Atrial Fibrillation,                 Signs/Symptoms:Shortness of Breath and Chest Pain; Risk                 Factors:Former Smoker and Sleep Apnea.  Sonographer:    Wallie Char Referring Phys: [id number] Tessa Lerner  Sonographer Comments: Technically difficult study due to poor echo windows. Image acquisition challenging due to respiratory motion. IMPRESSIONS  1. Left ventricular ejection fraction, by estimation, is 60 to 65%. The left ventricle has normal function. The left ventricle has no regional wall motion abnormalities. Left ventricular diastolic function could not be evaluated. Elevated left atrial pressure. The E/e' is 16.5.  2. Right ventricular systolic function is low normal. The right ventricular size is mildly enlarged. There is moderately elevated pulmonary artery systolic pressure. The estimated right ventricular systolic pressure is 53.4 mmHg.  3. Right atrial size was dilated.  4. A small pericardial effusion is present. There is no evidence of cardiac tamponade.  5. The mitral valve is  degenerative. Trivial mitral valve regurgitation. No evidence of mitral stenosis.  6. The aortic valve is grossly  normal. Aortic valve regurgitation is not visualized. Aortic valve sclerosis is present, with no evidence of aortic valve stenosis.  7. The inferior vena cava is dilated in size with <50% respiratory variability, suggesting right atrial pressure of 15 mmHg.  8. Rhythm strip during this exam demonstrates atrial fibrillation. Comparison(s): Outside echo 06/18/2021: LVEF 55-60%,mild TR, RVSP . FINDINGS  Left Ventricle: Left ventricular ejection fraction, by estimation, is 60 to 65%. The left ventricle has normal function. The left ventricle has no regional wall motion abnormalities. Definity contrast agent was given IV to delineate the left ventricular  endocardial borders. The left ventricular internal cavity size was normal in size. There is no left ventricular hypertrophy. Left ventricular diastolic function could not be evaluated due to atrial fibrillation. Left ventricular diastolic function could  not be evaluated. Elevated left atrial pressure. The E/e' is 16.5. Right Ventricle: The right ventricular size is mildly enlarged. Right vetricular wall thickness was not well visualized. Right ventricular systolic function is low normal. There is moderately elevated pulmonary artery systolic pressure. The tricuspid regurgitant velocity is 3.10 m/s, and with an assumed right atrial pressure of 15 mmHg, the estimated right ventricular systolic pressure is 53.4 mmHg. Left Atrium: Left atrial size was normal in size. Right Atrium: Right atrial size was dilated. Pericardium: A small pericardial effusion is present. There is no evidence of cardiac tamponade. Mitral Valve: The mitral valve is degenerative in appearance. Trivial mitral valve regurgitation. No evidence of mitral valve stenosis. MV peak gradient, 8.2 mmHg. The mean mitral valve gradient is 2.7 mmHg. Tricuspid Valve: The tricuspid valve is not  well visualized. Tricuspid valve regurgitation is mild . No evidence of tricuspid stenosis. Aortic Valve: The aortic valve is grossly normal. Aortic valve regurgitation is not visualized. Aortic valve sclerosis is present, with no evidence of aortic valve stenosis. Aortic valve mean gradient measures 4.2 mmHg. Aortic valve peak gradient measures 8.0 mmHg. Aortic valve area, by VTI measures 3.33 cm. Pulmonic Valve: The pulmonic valve was not well visualized. Pulmonic valve regurgitation is trivial. No evidence of pulmonic stenosis. Aorta: The aortic root and ascending aorta are structurally normal, with no evidence of dilitation. Venous: The inferior vena cava is dilated in size with less than 50% respiratory variability, suggesting right atrial pressure of 15 mmHg. IAS/Shunts: No atrial level shunt detected by color flow Doppler. EKG: Rhythm strip during this exam demonstrates atrial fibrillation.  LEFT VENTRICLE PLAX 2D LVIDd:         4.20 cm      Diastology LVIDs:         2.90 cm      LV e' medial:    9.73 cm/s LV PW:         1.00 cm      LV E/e' medial:  15.4 LV IVS:        0.60 cm      LV e' lateral:   8.61 cm/s LVOT diam:     2.10 cm      LV E/e' lateral: 17.5 LV SV:         95 LV SV Index:   45 LVOT Area:     3.46 cm  LV Volumes (MOD) LV vol d, MOD A2C: 134.0 ml LV vol d, MOD A4C: 106.0 ml LV vol s, MOD A2C: 43.0 ml LV vol s, MOD A4C: 35.5 ml LV SV MOD A2C:     91.0 ml LV SV MOD A4C:     106.0 ml LV SV MOD BP:  82.9 ml RIGHT VENTRICLE             IVC RV S prime:     12.10 cm/s  IVC diam: 2.60 cm TAPSE (M-mode): 1.3 cm LEFT ATRIUM             Index        RIGHT ATRIUM           Index LA diam:        4.20 cm 1.99 cm/m   RA Area:     21.80 cm LA Vol (A2C):   52.4 ml 24.83 ml/m  RA Volume:   67.40 ml  31.94 ml/m LA Vol (A4C):   53.2 ml 25.21 ml/m LA Biplane Vol: 54.3 ml 25.73 ml/m  AORTIC VALVE AV Area (Vmax):    3.24 cm AV Area (Vmean):   3.09 cm AV Area (VTI):     3.33 cm AV Vmax:           141.50  cm/s AV Vmean:          94.500 cm/s AV VTI:            0.286 m AV Peak Grad:      8.0 mmHg AV Mean Grad:      4.2 mmHg LVOT Vmax:         132.25 cm/s LVOT Vmean:        84.250 cm/s LVOT VTI:          0.275 m LVOT/AV VTI ratio: 0.96  AORTA Ao Root diam: 3.40 cm Ao Asc diam:  3.30 cm MITRAL VALVE                TRICUSPID VALVE MV Area (PHT): 3.30 cm     TR Peak grad:   38.4 mmHg MV Area VTI:   2.57 cm     TR Vmax:        310.00 cm/s MV Peak grad:  8.2 mmHg MV Mean grad:  2.7 mmHg     SHUNTS MV Vmax:       1.43 m/s     Systemic VTI:  0.28 m MV Vmean:      74.5 cm/s    Systemic Diam: 2.10 cm MV Decel Time: 230 msec MV E velocity: 150.33 cm/s MV A velocity: 73.33 cm/s MV E/A ratio:  2.05 Honor Fairbank DO Electronically signed by Tessa Lerner DO Signature Date/Time: 07/07/2022/9:52:12 AM    Final    US Abdomen Complete  Result Date: 07/06/2022 CLINICAL DATA:  Cirrhosis. EXAM: ABDOMEN ULTRASOUND COMPLETE COMPARISON:  PET CT 02/13/2022, MRCP 12/16/2021 FINDINGS: Gallbladder: Partially distended. Wall thickening of 6 mm. No gallstones. No sonographic Murphy sign noted by sonographer. Common bile duct: Diameter: Previous common bile duct dilatation is not seen on the current exam, visualized common bile duct measures 4 mm. Pneumobilia on prior PET is not seen by ultrasound. Liver: Heterogeneous hepatic parenchyma, mildly increased. Nodular patter contours. There is no discrete hepatic lesion, although portions of the liver are suboptimally assessed. No flow is demonstrated in the main portal vein. IVC: No abnormality visualized. Pancreas: Visualized portion unremarkable. Spleen: Size and appearance within normal limits. No splenomegaly, greatest splenic length of 10.3 cm. Right Kidney: Length: 8.5 cm. Increased parenchymal echogenicity with renal parenchymal thinning. No hydronephrosis. No evidence of focal lesion. Left Kidney: Length: 8.8 cm. Increased parenchymal echogenicity with renal parenchymal thinning. No  hydronephrosis. Renal sinus cysts on prior PET are not well seen. No evidence of stone or focal lesion. Abdominal aorta:  No aneurysm visualized. Other findings: No definite abdominal ascites. IMPRESSION: 1. Technically limited exam. Cirrhotic hepatic morphology, no evidence of focal liver lesion. 2. No flow is demonstrated in the main portal vein. It is unclear if this is due to technical factors or true portal vein occlusion. Recommend contrast-enhanced CT for further assessment. 3. Increased renal parenchymal echogenicity with parenchymal thinning typical of chronic medical renal disease. No hydronephrosis. Electronically Signed   By: Narda Rutherford M.D.   On: 07/06/2022 17:32   DG Chest Portable 1 View  Result Date: 07/06/2022 CLINICAL DATA:  Chest pain EXAM: PORTABLE CHEST 1 VIEW COMPARISON:  Chest CT 02/07/2022. FINDINGS: The heart is enlarged, unchanged. The upper mediastinal contours are normal. There are small left larger than right pleural effusions with adjacent airspace opacities in the lung bases. The upper lungs well aerated. There is no evidence of overt pulmonary edema. There is no pneumothorax There is no acute osseous abnormality. IMPRESSION: Left larger than right pleural effusions with adjacent airspace opacities which may reflect atelectasis or pneumonia. Electronically Signed   By: Lesia Hausen M.D.   On: 07/06/2022 10:01    CARDIAC DATABASE: EKG: July 06, 2022: Atrial fibrillation, 92 bpm, low voltage, consider lateral ischemia, rare PVCs.   Echocardiogram: 07/07/2022  1. Left ventricular ejection fraction, by estimation, is 60 to 65%. The left ventricle has normal function. The left ventricle has no regional wall motion abnormalities. Left ventricular diastolic function could not be evaluated. Elevated left atrial pressure. The E/e' is 16.5.   2. Right ventricular systolic function is low normal. The right ventricular size is mildly enlarged. There is moderately elevated pulmonary  artery systolic pressure. The estimated right ventricular  systolic pressure is 53.4 mmHg.   3. Right atrial size was dilated.   4. A small pericardial effusion is present. There is no evidence of cardiac tamponade.   5. The mitral valve is degenerative. Trivial mitral valve regurgitation. No evidence of mitral stenosis.   6. The aortic valve is grossly normal. Aortic valve regurgitation is not visualized. Aortic valve sclerosis is present, with no evidence of aortic valve stenosis.   7. The inferior vena cava is dilated in size with <50% respiratory variability, suggesting right atrial pressure of 15 mmHg.   8. Rhythm strip during this exam demonstrates atrial fibrillation.   Comparison(s): Outside echo 06/18/2021: LVEF 55-60%,mild TR, RVSP .   Stress test: Lexiscan Tetrofosmin stress test 02/08/2020: Lexiscan nuclear stress test performed using 1-day protocol. Normal myocardial perfusion. Stress LVEF 76%. Low risk study.  Heart catheterization: None  Scheduled Meds:  bumetanide (BUMEX) IV  1 mg Intravenous Q12H   levothyroxine  112 mcg Oral QAC breakfast   sodium chloride flush  3 mL Intravenous Q12H    Continuous Infusions:  pantoprazole 8 mg/hr (07/07/22 0628)    PRN Meds: acetaminophen **OR** acetaminophen, perflutren lipid microspheres (DEFINITY) IV suspension   IMPRESSION & RECOMMENDATIONS: Carl Woods is a 89 y.o. Caucasian male whose past medical history and cardiac risk factors include: Permanent atrial fibrillation on anticoagulation, sleep apnea on CPAP, hypertension, emphysema, diabetes, Barrett's esophagus, diverticulosis, history of cancer of ampulla of Vater, advanced age.   Impression: NSTEMI likely secondary to supply demand ischemia/symptomatic anemia Acute HFpEF Preprocedure risk stratification Symptomatic severe anemia (baseline hemoglobin 10, hemoglobin on arrival 5 g/dL) Acute kidney injury. Permanent atrial fibrillation. 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 Hypokalemia  Recommendations: NSTEMI: Likely precipitated by symptomatic anemia with hemoglobin of 5  g/dL on arrival Patient has received 3 units of PRBCs and about to receive his fourth Now chest pain-free Check EKG No significant dysrhythmias with the exception of PVCs on telemetry Echocardiogram notes preserved LVEF without regional wall motion abnormalities.  Acute HFpEF: Volume overload on physical examination, elevated BNP level. Elevated filling pressures on echocardiography. Has diuresed well up to 4 L on IV Bumex over 24 hours. Will uptitrate GDMT prior to discharge. Focus on diuresis for now. Recommend bilateral compression stockings to help facilitate fluid shifts. Strict I's and O's and daily weights Will hold off on SGLT2 inhibitors given the upcoming endoscopy Will focus on volume management for now.  Severe symptomatic anemia Baseline hemoglobin 10, hemoglobin on arrival 5. About to receive his fourth PRBC. Plans undergo endoscopy.  Preprocedure risk stratification: Patient is now chest pain-free. Echocardiogram notes preserved LVEF without regional wall motion abnormalities. Given the continued loss of hemoglobin despite PRBCs patient needs endoscopy for further evaluation and management. He has diuresed 4 L over the last 24 hours. Ideally an additional day we can focus on diuresis which will optimize his fluid status, replete electrolytes, and give PRBC as needed and manage his acute comorbidities.  However, if endoscopy is needed to be performed today I think he is at acceptable/moderate risk for cardiovascular complications given his comorbidities and clinical presentation.  Procedure is not prohibitive.  Patient is agreeable with the plan of care and finds this risk is acceptable.  I spoke to his daughter Randa Evens as well she is agreeable for the plan.  Cardiology available if  needed.  Acute kidney injury: Likely secondary to hypotension, intravascular depletion, volume overload, diuresis. Continue BUN and creatinine.  Permanent atrial fibrillation: Long-term anticoagulation. Continue metoprolol at home dose. Not antiarrhythmics. CHA2DS2-VASc score 3. Given the severe symptomatic anemia with a hemoglobin of 5 g/dL on presentation recommend holding off anticoagulation for now patient and family understand the risk of thromboembolic events.  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  Patient's questions and concerns were addressed to his satisfaction. He voices understanding of the instructions provided during this encounter.   This note was created using a voice recognition software as a result there may be grammatical errors inadvertently enclosed that do not reflect the nature of this encounter. Every attempt is made to correct such errors.  [name] [facility]  Pager:  [phone] Office: [phone] 07/07/2022, 10:19 AM

## 2022-07-07 NOTE — Progress Notes (Signed)
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  Patient was admitted with chest pain and shortness of breath.  On presentation to the hospital, patient was found to have hemoglobin of 5 g/dL, MCV of 846.  Potassium was also low and troponin elevated (likely type II elevation).  History of black stools prior to presentation.  Hemoglobin this morning was 6.5 g/dL.  Patient has received 2 extra units of packed red blood cells.  Cardiology input is appreciated.  Cardiology team wants hemoglobin to be greater than 8 g/dL.  GI team has also being consulted.  GI team plans to proceed with EGD once cleared for EGD by the cardiology team.  07/07/2022: Patient seen.  Patient continues to report dyspnea with activity.   Assessment & Plan:   Principal Problem:   Symptomatic anemia Active Problems:   Iron deficiency anemia due to chronic blood loss   Long term (current) use of anticoagulants   Atrial fibrillation (HCC)   Cancer of ampulla of Vater (HCC)   Non-ST elevation (NSTEMI) myocardial infarction (HCC)   Elevated brain natriuretic peptide (BNP) level   Pleural effusion   Hypokalemia   Hypothyroidism   GERD (gastroesophageal reflux disease)   Shortness of breath   Hypervolemia   AKI (acute kidney injury) (HCC)   Acute heart failure with preserved ejection fraction (HFpEF) (HCC)   Preprocedural cardiovascular examination   Symptomatic anemia GI bleed suspected -Admitted with hemoglobin of 5 g/dL. -Elevated troponin, likely type II elevation. -GI team has been consulted. -Patient has been transfused with total of 4 units of packed red blood  cells. -Last documented hemoglobin was 6.5 g/dL. -Repeat H/H. -Keep hemoglobin greater than 8 g/dL. -Low threshold to consult palliative care team.   -GI team is appreciated.  For EGD once cleared for EGD by the cardiology team.   NSTEMI Elevated BNP Pleural Effusion > Patient presenting with chest pain and shortness of breath as above believed to be symptomatic anemia. > Was additionally noted to have troponin elevated to 138 and then increased to 247 on repeat.  BNP elevated to 413. > No prior history of CAD or CHF.  Last echo was in 2023 and showed EF of 55-60%, indeterminate diastolic function, resolution of previous pericardial effusion. > EDP reported patient did appear volume overloaded and has ordered a dose of Lasix prior to transfusion. CXR with pleural effusions. > Cardiology also consulted in ED -Suspect troponin elevation is type II elevation. - Continue to trend troponin - EKG as needed - Echocardiogram - In's and outs, daily weights - Goal hemoglobin of 8 as above   Hypokalemia > Mild hypokalemia in the ED with potassium of 3.4. - Will continue to trend potassium, will plan to replete p.o. versus IV depending on GI recommendations - Check magnesium 07/07/2022: KCl 40 mEq every 4 hourly x 2 doses.   Atrial fibrillation -Continue home metoprolol - Holding home Eliquis given active bleed as above   Hypothyroidism - Continue home Synthroid   GERD - On IV PPI   OSA - CPAP   Cancer of the ampulla of Vater >  Status post radiation, plan for ERCP in July to evaluate status after radiation. - Appreciate  any GI recommendations regarding this - Continue to monitor      DVT prophylaxis: SCD. Code Status: Full code. Family Communication: Carl Woods. Disposition Plan: Patient remains an inpatient for now.  Further management will depend on hospital course.   Consultants:  Cardiology. GI.  Procedures:  Possible EGD tomorrow, if cleared for EGD by the  cardiology team.  Antimicrobials:  None.   Subjective: No chest pain. No shortness of breath.  Objective: Vitals:   07/07/22 0905 07/07/22 0939 07/07/22 0945 07/07/22 1000  BP: (!) 103/44 (!) 112/46  (!) 106/54  Pulse:  76 78 93  Resp: (!) 31 (!) 26 (!) 26 (!) 21  Temp: 98.5 F (36.9 C) 98.7 F (37.1 C)  98.8 F (37.1 C)  TempSrc:  Oral  Oral  SpO2: 97% 99% 98% 99%  Weight:      Height:        Intake/Output Summary (Last 24 hours) at 07/07/2022 1242 Last data filed at 07/07/2022 0905 Gross per 24 hour  Intake 300 ml  Output 3900 ml  Net -3600 ml   Filed Weights   07/06/22 1031 07/07/22 0447  Weight: 99.8 kg 97.8 kg    Examination:  General exam: Appears calm and comfortable.  Patient is pale.  Patient seems volume overloaded. Respiratory system: Clear to auscultation.  Cardiovascular system: S1 & S2 heard. Gastrointestinal system: Abdomen is soft and nontender.  Central nervous system: Awake and alert.  Patient moves all extremities.   Extremities: Edema of the extremities.  Data Reviewed: I have personally reviewed following labs and imaging studies  CBC: Recent Labs  Lab 07/06/22 0934 07/06/22 2100 07/07/22 0304  WBC 12.3* 10.8* 8.3  NEUTROABS  --  9.1*  --   HGB 5.0* 7.0* 6.5*  HCT 16.4* 22.1* 20.1*  MCV 125.2* 107.3* 107.5*  PLT 106* 93* 102*   Basic Metabolic Panel: Recent Labs  Lab 07/06/22 0934 07/06/22 2100 07/07/22 0304  NA 137 139 138  K 3.4* 3.4* 3.3*  CL 111 108 108  CO2 18* 20* 22  GLUCOSE 133* 135* 108*  BUN 28* 28* 29*  CREATININE 1.50* 1.74* 1.70*  CALCIUM 8.7* 8.6* 8.2*  MG 1.5* 1.6*  --    GFR: Estimated Creatinine Clearance: 33.4 mL/min (A) (by C-G formula based on SCr of 1.7 mg/dL (H)). Liver Function Tests: Recent Labs  Lab 07/06/22 2100 07/07/22 0304  AST 28 23  ALT 14 13  ALKPHOS 54 51  BILITOT 1.4* 1.1  PROT 5.8* 5.1*  ALBUMIN 2.7* 2.3*   No results for input(s): "LIPASE", "AMYLASE" in the last 168  hours. No results for input(s): "AMMONIA" in the last 168 hours. Coagulation Profile: Recent Labs  Lab 07/06/22 0936 07/06/22 1700 07/06/22 2100  INR 1.6* 1.5* 1.4*   Cardiac Enzymes: No results for input(s): "CKTOTAL", "CKMB", "CKMBINDEX", "TROPONINI" in the last 168 hours. BNP (last 3 results) No results for input(s): "PROBNP" in the last 8760 hours. HbA1C: No results for input(s): "HGBA1C" in the last 72 hours. CBG: No results for input(s): "GLUCAP" in the last 168 hours. Lipid Profile: No results for input(s): "CHOL", "HDL", "LDLCALC", "TRIG", "CHOLHDL", "LDLDIRECT" in the last 72 hours. Thyroid Function Tests: No results for input(s): "TSH", "T4TOTAL", "FREET4", "T3FREE", "THYROIDAB" in the last 72 hours. Anemia Panel: Recent Labs    07/06/22 1101 07/06/22 2057  VITAMINB12  --  513  FOLATE 7.3  --   FERRITIN 172  --   TIBC 346  --   IRON 216*  --  RETICCTPCT  --  7.0*   Urine analysis: No results found for: "COLORURINE", "APPEARANCEUR", "LABSPEC", "PHURINE", "GLUCOSEU", "HGBUR", "BILIRUBINUR", "KETONESUR", "PROTEINUR", "UROBILINOGEN", "NITRITE", "LEUKOCYTESUR" Sepsis Labs: @LABRCNTIP (procalcitonin:4,lacticidven:4)  )No results found for this or any previous visit (from the past 240 hour(s)).       Radiology Studies: ECHOCARDIOGRAM COMPLETE  Result Date: 07/07/2022    ECHOCARDIOGRAM REPORT   Patient Name:   Carl Woods Date of Exam: 07/07/2022 Medical Rec #:  [id number]    Height:       68.0 in Accession #:    [id number]   Weight:       215.6 lb Date of Birth:  01-01-1932    BSA:          2.110 m Patient Age:    89 years     BP:           104/52 mmHg Patient Gender: M            HR:           74 bpm. Exam Location:  Inpatient Procedure: 2D Echo, Cardiac Doppler, Color Doppler and Intracardiac            Opacification Agent Indications:    NSTEMI, Chest Pain  History:        Patient has prior history of Echocardiogram examinations, most                 recent  06/18/2021. Cancer, Arrythmias:Atrial Fibrillation,                 Signs/Symptoms:Shortness of Breath and Chest Pain; Risk                 Factors:Former Smoker and Sleep Apnea.  Sonographer:    Wallie Char Referring Phys: [id number] Tessa Lerner  Sonographer Comments: Technically difficult study due to poor echo windows. Image acquisition challenging due to respiratory motion. IMPRESSIONS  1. Left ventricular ejection fraction, by estimation, is 60 to 65%. The left ventricle has normal function. The left ventricle has no regional wall motion abnormalities. Left ventricular diastolic function could not be evaluated. Elevated left atrial pressure. The E/e' is 16.5.  2. Right ventricular systolic function is low normal. The right ventricular size is mildly enlarged. There is moderately elevated pulmonary artery systolic pressure. The estimated right ventricular systolic pressure is 53.4 mmHg.  3. Right atrial size was dilated.  4. A small pericardial effusion is present. There is no evidence of cardiac tamponade.  5. The mitral valve is degenerative. Trivial mitral valve regurgitation. No evidence of mitral stenosis.  6. The aortic valve is grossly normal. Aortic valve regurgitation is not visualized. Aortic valve sclerosis is present, with no evidence of aortic valve stenosis.  7. The inferior vena cava is dilated in size with <50% respiratory variability, suggesting right atrial pressure of 15 mmHg.  8. Rhythm strip during this exam demonstrates atrial fibrillation. Comparison(s): Outside echo 06/18/2021: LVEF 55-60%,mild TR, RVSP . FINDINGS  Left Ventricle: Left ventricular ejection fraction, by estimation, is 60 to 65%. The left ventricle has normal function. The left ventricle has no regional wall motion abnormalities. Definity contrast agent was given IV to delineate the left ventricular  endocardial borders. The left ventricular internal cavity size was normal in size. There is no left ventricular  hypertrophy. Left ventricular diastolic function could not be evaluated due to atrial fibrillation. Left ventricular diastolic function could  not be evaluated. Elevated left atrial pressure. The E/e' is 16.5. Right Ventricle: The right  ventricular size is mildly enlarged. Right vetricular wall thickness was not well visualized. Right ventricular systolic function is low normal. There is moderately elevated pulmonary artery systolic pressure. The tricuspid regurgitant velocity is 3.10 m/s, and with an assumed right atrial pressure of 15 mmHg, the estimated right ventricular systolic pressure is 53.4 mmHg. Left Atrium: Left atrial size was normal in size. Right Atrium: Right atrial size was dilated. Pericardium: A small pericardial effusion is present. There is no evidence of cardiac tamponade. Mitral Valve: The mitral valve is degenerative in appearance. Trivial mitral valve regurgitation. No evidence of mitral valve stenosis. MV peak gradient, 8.2 mmHg. The mean mitral valve gradient is 2.7 mmHg. Tricuspid Valve: The tricuspid valve is not well visualized. Tricuspid valve regurgitation is mild . No evidence of tricuspid stenosis. Aortic Valve: The aortic valve is grossly normal. Aortic valve regurgitation is not visualized. Aortic valve sclerosis is present, with no evidence of aortic valve stenosis. Aortic valve mean gradient measures 4.2 mmHg. Aortic valve peak gradient measures 8.0 mmHg. Aortic valve area, by VTI measures 3.33 cm. Pulmonic Valve: The pulmonic valve was not well visualized. Pulmonic valve regurgitation is trivial. No evidence of pulmonic stenosis. Aorta: The aortic root and ascending aorta are structurally normal, with no evidence of dilitation. Venous: The inferior vena cava is dilated in size with less than 50% respiratory variability, suggesting right atrial pressure of 15 mmHg. IAS/Shunts: No atrial level shunt detected by color flow Doppler. EKG: Rhythm strip during this exam demonstrates  atrial fibrillation.  LEFT VENTRICLE PLAX 2D LVIDd:         4.20 cm      Diastology LVIDs:         2.90 cm      LV e' medial:    9.73 cm/s LV PW:         1.00 cm      LV E/e' medial:  15.4 LV IVS:        0.60 cm      LV e' lateral:   8.61 cm/s LVOT diam:     2.10 cm      LV E/e' lateral: 17.5 LV SV:         95 LV SV Index:   45 LVOT Area:     3.46 cm  LV Volumes (MOD) LV vol d, MOD A2C: 134.0 ml LV vol d, MOD A4C: 106.0 ml LV vol s, MOD A2C: 43.0 ml LV vol s, MOD A4C: 35.5 ml LV SV MOD A2C:     91.0 ml LV SV MOD A4C:     106.0 ml LV SV MOD BP:      82.9 ml RIGHT VENTRICLE             IVC RV S prime:     12.10 cm/s  IVC diam: 2.60 cm TAPSE (M-mode): 1.3 cm LEFT ATRIUM             Index        RIGHT ATRIUM           Index LA diam:        4.20 cm 1.99 cm/m   RA Area:     21.80 cm LA Vol (A2C):   52.4 ml 24.83 ml/m  RA Volume:   67.40 ml  31.94 ml/m LA Vol (A4C):   53.2 ml 25.21 ml/m LA Biplane Vol: 54.3 ml 25.73 ml/m  AORTIC VALVE AV Area (Vmax):    3.24 cm AV Area (Vmean):   3.09 cm  AV Area (VTI):     3.33 cm AV Vmax:           141.50 cm/s AV Vmean:          94.500 cm/s AV VTI:            0.286 m AV Peak Grad:      8.0 mmHg AV Mean Grad:      4.2 mmHg LVOT Vmax:         132.25 cm/s LVOT Vmean:        84.250 cm/s LVOT VTI:          0.275 m LVOT/AV VTI ratio: 0.96  AORTA Ao Root diam: 3.40 cm Ao Asc diam:  3.30 cm MITRAL VALVE                TRICUSPID VALVE MV Area (PHT): 3.30 cm     TR Peak grad:   38.4 mmHg MV Area VTI:   2.57 cm     TR Vmax:        310.00 cm/s MV Peak grad:  8.2 mmHg MV Mean grad:  2.7 mmHg     SHUNTS MV Vmax:       1.43 m/s     Systemic VTI:  0.28 m MV Vmean:      74.5 cm/s    Systemic Diam: 2.10 cm MV Decel Time: 230 msec MV E velocity: 150.33 cm/s MV A velocity: 73.33 cm/s MV E/A ratio:  2.05 Sunit Tolia DO Electronically signed by Tessa Lerner DO Signature Date/Time: 07/07/2022/9:52:12 AM    Final    US Abdomen Complete  Result Date: 07/06/2022 CLINICAL DATA:  Cirrhosis. EXAM:  ABDOMEN ULTRASOUND COMPLETE COMPARISON:  PET CT 02/13/2022, MRCP 12/16/2021 FINDINGS: Gallbladder: Partially distended. Wall thickening of 6 mm. No gallstones. No sonographic Murphy sign noted by sonographer. Common bile duct: Diameter: Previous common bile duct dilatation is not seen on the current exam, visualized common bile duct measures 4 mm. Pneumobilia on prior PET is not seen by ultrasound. Liver: Heterogeneous hepatic parenchyma, mildly increased. Nodular patter contours. There is no discrete hepatic lesion, although portions of the liver are suboptimally assessed. No flow is demonstrated in the main portal vein. IVC: No abnormality visualized. Pancreas: Visualized portion unremarkable. Spleen: Size and appearance within normal limits. No splenomegaly, greatest splenic length of 10.3 cm. Right Kidney: Length: 8.5 cm. Increased parenchymal echogenicity with renal parenchymal thinning. No hydronephrosis. No evidence of focal lesion. Left Kidney: Length: 8.8 cm. Increased parenchymal echogenicity with renal parenchymal thinning. No hydronephrosis. Renal sinus cysts on prior PET are not well seen. No evidence of stone or focal lesion. Abdominal aorta: No aneurysm visualized. Other findings: No definite abdominal ascites. IMPRESSION: 1. Technically limited exam. Cirrhotic hepatic morphology, no evidence of focal liver lesion. 2. No flow is demonstrated in the main portal vein. It is unclear if this is due to technical factors or true portal vein occlusion. Recommend contrast-enhanced CT for further assessment. 3. Increased renal parenchymal echogenicity with parenchymal thinning typical of chronic medical renal disease. No hydronephrosis. Electronically Signed   By: Narda Rutherford M.D.   On: 07/06/2022 17:32   DG Chest Portable 1 View  Result Date: 07/06/2022 CLINICAL DATA:  Chest pain EXAM: PORTABLE CHEST 1 VIEW COMPARISON:  Chest CT 02/07/2022. FINDINGS: The heart is enlarged, unchanged. The upper  mediastinal contours are normal. There are small left larger than right pleural effusions with adjacent airspace opacities in the lung bases. The upper lungs well aerated. There is  no evidence of overt pulmonary edema. There is no pneumothorax There is no acute osseous abnormality. IMPRESSION: Left larger than right pleural effusions with adjacent airspace opacities which may reflect atelectasis or pneumonia. Electronically Signed   By: Lesia Hausen M.D.   On: 07/06/2022 10:01        Scheduled Meds:  bumetanide (BUMEX) IV  1 mg Intravenous Q12H   iohexol  500 mL Oral Q1H   levothyroxine  112 mcg Oral QAC breakfast   potassium chloride  40 mEq Oral Q4H   sodium chloride flush  3 mL Intravenous Q12H   Continuous Infusions:  pantoprazole 8 mg/hr (07/07/22 0628)     LOS: 1 day    Time spent: 55 minutes.    Berton Mount, MD  [facility] Pager #: [phone] 7PM-7AM contact night coverage as above

## 2022-07-07 NOTE — Progress Notes (Addendum)
Attending physician's note   I have reviewed the chart and discussed his care on rounds.  Care also discussed with his son in hospital room. I performed a substantive portion of this encounter, including complete performance of at least one of the key components, in conjunction with the APP. I agree with the APP's note, impression, and recommendations with my edits.   -CT abdomen today to r/o PV thrombus (no flow visualized  vs poor visualization) on ultrasound - H/H declined again today at 6.5/20.1.  No overt bleeding and FOBT negative.  Transfusing again, but unclear if this is truly ABLA. - Tentative plan for EGD later this week pending cardiac optimization with diuresis.  Cardiology service following - Holding anticoagulation - Continue serial CBC checks with additional blood products as needed per protocol - GI service will continue to follow  Charan Prieto, DO, FACG [phone] office          Progress Note  Primary GI: Dr. Leone Payor  LOS: 1 day   Chief Complaint: Symptomatic anemia   Subjective    No family was present at the time of my evaluation. Patient states he does not feel any better with comparison in the last few days.  Patient is visibly more short of breath than when I saw him yesterday.  Appears difficult for him to talk due to shortness of breath.  Patient denies abdominal pain, nausea, vomiting.  Patient denies further melena   Objective   Vital signs in last 24 hours: Temp:  [97.8 F (36.6 C)-99.9 F (37.7 C)] 98.5 F (36.9 C) (06/10 0905) Pulse Rate:  [53-104] 53 (06/10 0855) Resp:  [17-31] 31 (06/10 0905) BP: (98-122)/(42-67) 103/44 (06/10 0905) SpO2:  [97 %-100 %] 97 % (06/10 0905) Weight:  [97.8 kg-99.8 kg] 97.8 kg (06/10 0447) Last BM Date : 07/07/22 Last BM recorded by nurses in past 5 days No data recorded  General:   male with increased respiratory effort Heart:  Regular rate and rhythm; no murmurs Pulm: Clear anteriorly; no  wheezing.  Increased respiratory effort Abdomen: soft, nondistended, normal bowel sounds in all quadrants. Nontender without guarding. No organomegaly appreciated. Extremities:  No edema Neurologic:  Alert and  oriented x4;  No focal deficits.  Psych:  Cooperative. Normal mood and affect.  Intake/Output from previous day: 06/09 0701 - 06/10 0700 In: -  Out: 4100 [Urine:4100] Intake/Output this shift: Total I/O In: 300 [Blood:300] Out: -   Studies/Results: US Abdomen Complete  Result Date: 07/06/2022 CLINICAL DATA:  Cirrhosis. EXAM: ABDOMEN ULTRASOUND COMPLETE COMPARISON:  PET CT 02/13/2022, MRCP 12/16/2021 FINDINGS: Gallbladder: Partially distended. Wall thickening of 6 mm. No gallstones. No sonographic Murphy sign noted by sonographer. Common bile duct: Diameter: Previous common bile duct dilatation is not seen on the current exam, visualized common bile duct measures 4 mm. Pneumobilia on prior PET is not seen by ultrasound. Liver: Heterogeneous hepatic parenchyma, mildly increased. Nodular patter contours. There is no discrete hepatic lesion, although portions of the liver are suboptimally assessed. No flow is demonstrated in the main portal vein. IVC: No abnormality visualized. Pancreas: Visualized portion unremarkable. Spleen: Size and appearance within normal limits. No splenomegaly, greatest splenic length of 10.3 cm. Right Kidney: Length: 8.5 cm. Increased parenchymal echogenicity with renal parenchymal thinning. No hydronephrosis. No evidence of focal lesion. Left Kidney: Length: 8.8 cm. Increased parenchymal echogenicity with renal parenchymal thinning. No hydronephrosis. Renal sinus cysts on prior PET are not well seen. No evidence of stone or focal lesion.  Abdominal aorta: No aneurysm visualized. Other findings: No definite abdominal ascites. IMPRESSION: 1. Technically limited exam. Cirrhotic hepatic morphology, no evidence of focal liver lesion. 2. No flow is demonstrated in the main  portal vein. It is unclear if this is due to technical factors or true portal vein occlusion. Recommend contrast-enhanced CT for further assessment. 3. Increased renal parenchymal echogenicity with parenchymal thinning typical of chronic medical renal disease. No hydronephrosis. Electronically Signed   By: Narda Rutherford M.D.   On: 07/06/2022 17:32   DG Chest Portable 1 View  Result Date: 07/06/2022 CLINICAL DATA:  Chest pain EXAM: PORTABLE CHEST 1 VIEW COMPARISON:  Chest CT 02/07/2022. FINDINGS: The heart is enlarged, unchanged. The upper mediastinal contours are normal. There are small left larger than right pleural effusions with adjacent airspace opacities in the lung bases. The upper lungs well aerated. There is no evidence of overt pulmonary edema. There is no pneumothorax There is no acute osseous abnormality. IMPRESSION: Left larger than right pleural effusions with adjacent airspace opacities which may reflect atelectasis or pneumonia. Electronically Signed   By: Lesia Hausen M.D.   On: 07/06/2022 10:01    Lab Results: Recent Labs    07/06/22 0934 07/06/22 2100 07/07/22 0304  WBC 12.3* 10.8* 8.3  HGB 5.0* 7.0* 6.5*  HCT 16.4* 22.1* 20.1*  PLT 106* 93* 102*   BMET Recent Labs    07/06/22 0934 07/06/22 2100 07/07/22 0304  NA 137 139 138  K 3.4* 3.4* 3.3*  CL 111 108 108  CO2 18* 20* 22  GLUCOSE 133* 135* 108*  BUN 28* 28* 29*  CREATININE 1.50* 1.74* 1.70*  CALCIUM 8.7* 8.6* 8.2*   LFT Recent Labs    07/07/22 0304  PROT 5.1*  ALBUMIN 2.3*  AST 23  ALT 13  ALKPHOS 51  BILITOT 1.1   PT/INR Recent Labs    07/06/22 1700 07/06/22 2100  LABPROT 18.5* 17.6*  INR 1.5* 1.4*     Scheduled Meds:  sodium chloride   Intravenous Once   bumetanide (BUMEX) IV  1 mg Intravenous Q12H   levothyroxine  112 mcg Oral QAC breakfast   sodium chloride flush  3 mL Intravenous Q12H   Continuous Infusions:  pantoprazole 8 mg/hr (07/07/22 9147)      Patient profile:   Carl Woods is a 89 y.o. male with past medical history significant for past medical history as listed below including A-fib on Eliquis, Barrett's esophagus, diverticulosis, GERD presents for symptomatic anemia.   Patient has a history of cancer of ampulla of Vater diagnosed 12/2021 due to abnormal scan and elevated LFTs.  Follows with Dr. Mosetta Putt. See GI workup below.  Patient is not a candidate for Whipple surgery.  Is undergoing radiation without chemotherapy (last radiation dose 03/2022). Scheduled for repeat ERCP 08/20/22  Presented with melena x 3 months (on iron), shortness of breath, and chest pain.  Hgb upon admission 5.0   Impression:   Symptomatic anemia Hgb 6.5 s/p 2 units PRBCs BUN 29, creatinine 1.70, GFR 38 PT 17.6, INR 1.4 Iron 216, TIBC 346, saturation 63% Ferritin 172 Fecal occult negative No further melena with further drop in hemoglobin despite 2 units PRBCs.  Iron studies are unremarkable and fecal occult is negative.  Question true upper GI bleed versus other source of worsening anemia. Suspect could be related to his known cancer of ampulla or vater(?).  Currently being worked up by cardiology to rule out angina.  They recommend waiting on their clearance  prior to procedure.  Patient is visibly more short of breath today.  Nodular liver January 2024/Cirrhosis (?) US abdomen complete shows cirrhotic hepatic morphology.  No flow in the main portal vein, unclear if due to technical factors or true portal vein occlusion.  Recommend contrast-enhanced CT for further assessment. normal LFTs platelets 102 previous known fatty liver MELD 3.0: 16 Patient denies alcohol use.  Previous known fatty liver.  Ultrasound shows cirrhosis but there were technical difficulties and question of portal vein occlusion.  Will further investigate.  A-fib on Eliquis -Last dose 6/9 AM  Cancer of the ampulla of Vader   Plan:   SYMPTOMATIC ANEMIA - Await cardiology clearance/workup prior to  proceeding with EGD.  Not emergently indicated at this time as no further overt bleeding and Hgb relatively stable -Continue daily CBC and transfuse as needed to maintain HGB > 7  -Continue PPI infusion -Continue to hold Eliquis  CIRRHOSIS -Proceed with CT abdomen pelvis with contrast for further evaluation of possible portal vein occlusion. May have to consider oral contrast due to kidney function. -Serologic evaluation for acute and chronic liver disease: ANA, AMA, IgG, asma, ceruloplasim, alpha-1, iron, ferritin, viral hepatitis - Serial INR, daily - monitor daily MELD  Bayley M McMichael  07/07/2022, 9:42 AM

## 2022-07-07 NOTE — NC FL2 (Signed)
[place] MEDICAID FL2 LEVEL OF CARE FORM     IDENTIFICATION  Patient Name: Carl Woods Birthdate: 1931-10-09 Sex: male Admission Date (Current Location): 07/06/2022  [facility] and [place] Number:  Producer, television/film/video and Address:  [facility] [place], [address] [place], [place] [address]      Provider Number: [id number]  Attending Physician Name and Address:  Barnetta Chapel, MD  Relative Name and Phone Number:  [name] (daughter) [phone]    Current Level of Care: Hospital Recommended Level of Care: Skilled Nursing Facility Prior Approval Number:    Date Approved/Denied:   PASRR Number: [id number]  Discharge Plan: SNF    Current Diagnoses: Patient Active Problem List   Diagnosis Date Noted   Acute heart failure with preserved ejection fraction (HFpEF) (HCC) 07/07/2022   Preprocedural cardiovascular examination 07/07/2022   Symptomatic anemia 07/06/2022   Non-ST elevation (NSTEMI) myocardial infarction (HCC) 07/06/2022   Elevated brain natriuretic peptide (BNP) level 07/06/2022   Pleural effusion 07/06/2022   Hypokalemia 07/06/2022   Hypothyroidism 07/06/2022   GERD (gastroesophageal reflux disease) 07/06/2022   Shortness of breath 07/06/2022   Hypervolemia 07/06/2022   AKI (acute kidney injury) (HCC) 07/06/2022   Vitamin B12 deficiency anemia 06/16/2022   Cancer of ampulla of Vater (HCC) 01/31/2022   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   Perianal rash 08/06/2017   Atrial fibrillation (HCC) 07/08/2016   Recurrent epistaxis 07/08/2016   Deviated nasal septum 07/08/2016   Cecal angiodysplasia 05/28/2016   Iron deficiency anemia due to chronic blood loss 04/23/2016   Heme positive stool 04/23/2016   Long term (current) use of anticoagulants 04/23/2016   BARRETTS ESOPHAGUS 04/23/2009   History of colonic polyps 04/23/2009    Orientation RESPIRATION BLADDER Height & Weight     Self, Time, Situation, Place (WDL)  Normal Continent Weight: 215  lb 9.8 oz (97.8 kg) Height:  5\' 8"  (172.7 cm)  BEHAVIORAL SYMPTOMS/MOOD NEUROLOGICAL BOWEL NUTRITION STATUS      Continent Diet (Please see discharge summary)  AMBULATORY STATUS COMMUNICATION OF NEEDS Skin   Limited Assist Verbally Other (Comment) (Appropriate for ethnicity,dry,Rash,Leg,Left,Wound/Incision LDAs)                       Personal Care Assistance Level of Assistance  Bathing, Feeding, Dressing Bathing Assistance: Limited assistance   Dressing Assistance: Limited assistance     Functional Limitations Info  Sight, Hearing, Speech Sight Info: Adequate Hearing Info: Adequate Speech Info: Adequate    SPECIAL CARE FACTORS FREQUENCY  PT (By licensed PT), OT (By licensed OT)     PT Frequency: 5x min weekly OT Frequency: 5x min weekly            Contractures Contractures Info: Not present    Additional Factors Info  Code Status, Allergies Code Status Info: FULL Allergies Info: NKA           Current Medications (07/07/2022):  This is the current hospital active medication list Current Facility-Administered Medications  Medication Dose Route Frequency Provider Last Rate Last Admin   acetaminophen (TYLENOL) tablet 650 mg  650 mg Oral Q6H PRN Synetta Fail, MD       Or   acetaminophen (TYLENOL) suppository 650 mg  650 mg Rectal Q6H PRN Synetta Fail, MD       bumetanide (BUMEX) injection 1 mg  1 mg Intravenous Q12H Tolia, Sunit, DO   1 mg at 07/07/22 1215   levothyroxine (SYNTHROID) tablet 112 mcg  112 mcg Oral QAC breakfast Synetta Fail, MD   112 mcg at 07/07/22 9147   pantoprozole (PROTONIX) 80 mg /NS 100 mL infusion  8 mg/hr Intravenous Continuous Synetta Fail, MD 10 mL/hr at 07/07/22 1736 8 mg/hr at 07/07/22 1736   potassium chloride (KLOR-CON) packet 40 mEq  40 mEq Oral Q4H Berton Mount I, MD   40 mEq at 07/07/22 1620   sodium chloride flush (NS) 0.9 % injection 3 mL  3 mL Intravenous Q12H Synetta Fail, MD   3 mL at  07/07/22 1215     Discharge Medications: Please see discharge summary for a list of discharge medications.  Relevant Imaging Results:  Relevant Lab Results:   Additional Information [id number]  [name], LCSWA

## 2022-07-07 NOTE — TOC Initial Note (Addendum)
Transition of Care [facility]) - Initial/Assessment Note    Patient Details  Name: Carl Woods MRN: [id number] Date of Birth: 09-12-1931  Transition of Care [facility]) CM/SW Contact:    Ronny Bacon, RN Phone Number: 07/07/2022, 1:44 PM  Clinical Narrative:  Spoke with patient at bedside. Patient confirmed lives at home with wife. He uses CPAP at night- [employer] is the company that he received the CPAP from. PCP is Dr. Nicholos Johns and he uses [facility] for his pharmacy. Patient has a daughter in [place], a son in [place] and another son that is between [place] and [place]. Patient drives himself and is caregiver for his wife who has dementia. Risk for readmission assessment completed,                Expected Discharge Plan: Home w Home Health Services Barriers to Discharge: Continued Medical Work up   Patient Goals and CMS Choice Patient states their goals for this hospitalization and ongoing recovery are:: To go home          Expected Discharge Plan and Services   Discharge Planning Services: CM Consult   Living arrangements for the past 2 months: Single Family Home                                      Prior Living Arrangements/Services Living arrangements for the past 2 months: Single Family Home Lives with:: Spouse (Spouse has dementia) Patient language and need for interpreter reviewed:: Yes Do you feel safe going back to the place where you live?: Yes      Need for Family Participation in Patient Care: Yes (Comment) Care giver support system in place?: Yes (comment) Current home services: DME (CPAP-[employer]) Criminal Activity/Legal Involvement Pertinent to Current Situation/Hospitalization: No - Comment as needed  Activities of Daily Living Home Assistive Devices/Equipment: None ADL Screening (condition at time of admission) Patient's cognitive ability adequate to safely complete daily activities?: Yes Is the patient deaf or have difficulty hearing?:  No Does the patient have difficulty seeing, even when wearing glasses/contacts?: No Does the patient have difficulty concentrating, remembering, or making decisions?: No Patient able to express need for assistance with ADLs?: Yes Does the patient have difficulty dressing or bathing?: No Independently performs ADLs?: Yes (appropriate for developmental age) Does the patient have difficulty walking or climbing stairs?: Yes Weakness of Legs: Both Weakness of Arms/Hands: Both  Permission Sought/Granted                  Emotional Assessment Appearance:: Appears stated age Attitude/Demeanor/Rapport: Engaged Affect (typically observed): Appropriate Orientation: : Oriented to Self, Oriented to Place, Oriented to  Time, Oriented to Situation Alcohol / Substance Use: Not Applicable Psych Involvement: No (comment)  Admission diagnosis:  Symptomatic anemia [D64.9] Patient Active Problem List   Diagnosis Date Noted   Acute heart failure with preserved ejection fraction (HFpEF) (HCC) 07/07/2022   Preprocedural cardiovascular examination 07/07/2022   Symptomatic anemia 07/06/2022   Non-ST elevation (NSTEMI) myocardial infarction (HCC) 07/06/2022   Elevated brain natriuretic peptide (BNP) level 07/06/2022   Pleural effusion 07/06/2022   Hypokalemia 07/06/2022   Hypothyroidism 07/06/2022   GERD (gastroesophageal reflux disease) 07/06/2022   Shortness of breath 07/06/2022   Hypervolemia 07/06/2022   AKI (acute kidney injury) (HCC) 07/06/2022   Vitamin B12 deficiency anemia 06/16/2022   Cancer of ampulla of Vater (HCC) 01/31/2022   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 [place] [address] Phone: [phone] Fax: [phone]     Social Determinants of Health (SDOH) Social History: SDOH Screenings   Food Insecurity: No Food Insecurity (02/18/2022)  Housing: Low Risk  (02/18/2022)  Transportation Needs: No Transportation Needs (02/18/2022)  Utilities: Not At Risk (02/18/2022)  Depression (PHQ2-9): Low Risk  (02/18/2022)  Tobacco Use: Medium Risk (07/06/2022)   SDOH Interventions:     Readmission Risk Interventions     No data to display

## 2022-07-08 DIAGNOSIS — I259 Chronic ischemic heart disease, unspecified: Secondary | ICD-10-CM | POA: Diagnosis not present

## 2022-07-08 DIAGNOSIS — I5031 Acute diastolic (congestive) heart failure: Secondary | ICD-10-CM

## 2022-07-08 DIAGNOSIS — K921 Melena: Secondary | ICD-10-CM | POA: Diagnosis not present

## 2022-07-08 DIAGNOSIS — I4891 Unspecified atrial fibrillation: Secondary | ICD-10-CM | POA: Diagnosis not present

## 2022-07-08 DIAGNOSIS — C241 Malignant neoplasm of ampulla of Vater: Secondary | ICD-10-CM | POA: Diagnosis not present

## 2022-07-08 DIAGNOSIS — D649 Anemia, unspecified: Secondary | ICD-10-CM | POA: Diagnosis not present

## 2022-07-08 DIAGNOSIS — Z7189 Other specified counseling: Secondary | ICD-10-CM

## 2022-07-08 DIAGNOSIS — Z515 Encounter for palliative care: Secondary | ICD-10-CM

## 2022-07-08 LAB — BPAM RBC
Blood Product Expiration Date: 202407062359
Blood Product Expiration Date: 202407082359
ISSUE DATE / TIME: 202406091131
ISSUE DATE / TIME: 202406091448
ISSUE DATE / TIME: 202406100626
ISSUE DATE / TIME: 202406100923
Unit Type and Rh: 600

## 2022-07-08 LAB — CERULOPLASMIN: Ceruloplasmin: 25.2 mg/dL (ref 16.0–31.0)

## 2022-07-08 LAB — TYPE AND SCREEN
Antibody Screen: NEGATIVE
Unit division: 0
Unit division: 0
Unit division: 0
Unit division: 0

## 2022-07-08 LAB — RENAL FUNCTION PANEL
Albumin: 2.5 g/dL — ABNORMAL LOW (ref 3.5–5.0)
Anion gap: 11 (ref 5–15)
BUN: 29 mg/dL — ABNORMAL HIGH (ref 8–23)
CO2: 22 mmol/L (ref 22–32)
Calcium: 8.6 mg/dL — ABNORMAL LOW (ref 8.9–10.3)
Chloride: 104 mmol/L (ref 98–111)
Creatinine, Ser: 1.58 mg/dL — ABNORMAL HIGH (ref 0.61–1.24)
GFR, Estimated: 42 mL/min — ABNORMAL LOW (ref >60)
Glucose, Bld: 104 mg/dL — ABNORMAL HIGH (ref 70–99)
Phosphorus: 2.1 mg/dL — ABNORMAL LOW (ref 2.5–4.6)
Potassium: 4.1 mmol/L (ref 3.5–5.1)
Sodium: 137 mmol/L (ref 135–145)

## 2022-07-08 LAB — ALPHA-1-ANTITRYPSIN: A-1 Antitrypsin, Ser: 206 mg/dL — ABNORMAL HIGH (ref 101–187)

## 2022-07-08 LAB — ANTI-SMOOTH MUSCLE ANTIBODY, IGG: F-Actin IgG: 9 Units (ref 0–19)

## 2022-07-08 LAB — MAGNESIUM: Magnesium: 1.7 mg/dL (ref 1.7–2.4)

## 2022-07-08 LAB — HEMOGLOBIN AND HEMATOCRIT, BLOOD
HCT: 27.5 % — ABNORMAL LOW (ref 39.0–52.0)
Hemoglobin: 8.9 g/dL — ABNORMAL LOW (ref 13.0–17.0)

## 2022-07-08 LAB — ANA W/REFLEX IF POSITIVE: Anti Nuclear Antibody (ANA): NEGATIVE

## 2022-07-08 LAB — PROTIME-INR
INR: 1.2 (ref 0.8–1.2)
Prothrombin Time: 15.7 seconds — ABNORMAL HIGH (ref 11.4–15.2)

## 2022-07-08 LAB — HEPATITIS B SURFACE ANTIBODY, QUANTITATIVE: Hep B S AB Quant (Post): <3.5 m[IU]/mL — ABNORMAL LOW (ref >9.9)

## 2022-07-08 MED ORDER — SODIUM CHLORIDE 0.9 % IV SOLN: INTRAVENOUS | Status: DC

## 2022-07-08 NOTE — Progress Notes (Signed)
PT Cancellation Note  Patient Details Name: Carl Woods MRN: [id number] DOB: 06-20-1931   Cancelled Treatment:    Reason Eval/Treat Not Completed: Patient declined, worked with OT earlier and is tired.   Angelina Ok [facility] 07/08/2022, 4:29 PM Skip Mayer PT Acute [employer] [phone]

## 2022-07-08 NOTE — Plan of Care (Signed)

## 2022-07-08 NOTE — Progress Notes (Signed)
Pt eating r/t no new orders at this time.

## 2022-07-08 NOTE — Progress Notes (Signed)
Pt remains NPO r/t night shift nurse verbalized possible EGD today awaiting MD orders.

## 2022-07-08 NOTE — Progress Notes (Signed)
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  Patient was admitted with chest pain and shortness of breath.  On presentation to the hospital, patient was found to have hemoglobin of 5 g/dL, MCV of 811.  Potassium was also low and troponin elevated (likely type II elevation).  History of black stools prior to presentation.  Hemoglobin this morning was 6.5 g/dL.  Patient has received 2 extra units of packed red blood cells.  Cardiology input is appreciated.  Cardiology team wants hemoglobin to be greater than 8 g/dL.  GI team has also being consulted.  GI team plans to proceed with EGD once cleared for EGD by the cardiology team.  07/07/2022: Patient seen.  Patient continues to report dyspnea with activity. 07/08/2022: Patient has been cleared for EGD by Cardiology team.  Will continue diuresis.  Will obtain chest x-ray tomorrow morning.  Also discussed with the GI team.  Hemoglobin has been stable.  Updated patient's son.   Assessment & Plan:   Principal Problem:   Symptomatic anemia Active Problems:   Iron deficiency anemia due to chronic blood loss   Long term (current) use of anticoagulants   Atrial fibrillation (HCC)   Cancer of ampulla of Vater (HCC)   Non-ST elevation (NSTEMI) myocardial infarction (HCC)   Elevated brain natriuretic peptide (BNP) level   Pleural effusion   Hypokalemia   Hypothyroidism   GERD (gastroesophageal reflux disease)   Shortness of breath   Hypervolemia   AKI (acute kidney injury) (HCC)   Acute heart failure with preserved ejection fraction (HFpEF) (HCC)   Preprocedural cardiovascular examination   Symptomatic  anemia GI bleed suspected -Admitted with hemoglobin of 5 g/dL. -Elevated troponin, likely type II elevation. -Patient has been transfused with total of 4 units of packed red blood cells. -Last hemoglobin is 8.9 g/dL, stable. -For EGD tomorrow. -Keep hemoglobin greater than 8 g/dL. -Patient was on Eliquis prior to presentation.  Likely, Eliquis contributed to patient's acute blood loss anemia.   NSTEMI Elevated BNP Pleural Effusion > Patient presenting with chest pain and shortness of breath, believed to be symptomatic anemia. > Elevated troponin (138 to 247).  BNP elevated to 413. > No prior history of CAD or CHF.   -Last echo was in 2023 and showed EF of 55-60%, indeterminate diastolic function, resolution of previous pericardial effusion. > Patient is on diuretics.  Volume overload is improving. -Cardiology input is appreciated. -Likely type II MI. -Chest x-ray in the morning prior to EGD.  - In's and outs, daily weights - Goal hemoglobin of 8 as above   Hypokalemia > Mild hypokalemia in the ED with potassium of 3.4. -Resolved.  Potassium is 4.1 today.   Atrial fibrillation -Continue home metoprolol - Holding home Eliquis given active bleed as above   Hypothyroidism - Continue home Synthroid   GERD - On IV PPI   OSA - CPAP   Cancer of the ampulla of Vater >  Status post radiation, plan for ERCP in July to evaluate status after radiation. - Appreciate any GI recommendations regarding this - Continue to monitor    CKD 3b versus acute kidney injury on chronic kidney disease stage IIIb: -Continue to monitor renal function  and electrolytes closely -Cautious diuresis.  Low blood CO2: -Possible secondary to metabolic acidosis in the setting of acute kidney injury. -Will need arterial blood gas for full analysis.  Patient is noted to have history of OSA. -CO2 is noted to be improving as acute kidney injury resolved slowly. -Continue to monitor  closely.  Obesity: -Patient has BMI of 31.11 kg/m. -Diet and exercise. -Further management on outpatient basis. -Cautiously optimize patient's volume as well.  DVT prophylaxis: SCD. Code Status: Full code. Family Communication: Kendrell Lakey. Disposition Plan: Patient remains an inpatient for now.  Further management will depend on hospital course.   Consultants:  Cardiology. GI.  Procedures:  Possible EGD tomorrow, if cleared for EGD by the cardiology team.  Antimicrobials:  None.   Subjective: No chest pain. No shortness of breath.  Objective: Vitals:   07/08/22 0100 07/08/22 0333 07/08/22 0800 07/08/22 1202  BP: 133/67 (!) 115/46 (!) 124/56 (!) 122/55  Pulse: 75 76 87 84  Resp: 19 (!) 24 20 (!) 21  Temp: 98.3 F (36.8 C) 98.1 F (36.7 C) 98.5 F (36.9 C) 97.6 F (36.4 C)  TempSrc: Oral Oral Oral Oral  SpO2: 97% 96% 100% 96%  Weight:      Height:        Intake/Output Summary (Last 24 hours) at 07/08/2022 1454 Last data filed at 07/08/2022 1304 Gross per 24 hour  Intake 273 ml  Output 4200 ml  Net -3927 ml    Filed Weights   07/06/22 1031 07/07/22 0447  Weight: 99.8 kg 97.8 kg    Examination:  General exam: Appears calm and comfortable.  Patient is pale.  Patient seems volume overloaded. Respiratory system: Clear to auscultation.  Cardiovascular system: S1 & S2 heard. Gastrointestinal system: Abdomen is soft and nontender.  Central nervous system: Awake and alert.  Patient moves all extremities.   Extremities: Edema of the extremities.  Data Reviewed: I have personally reviewed following labs and imaging studies  CBC: Recent Labs  Lab 07/06/22 0934 07/06/22 2100 07/07/22 0304 07/07/22 1733 07/08/22 0818  WBC 12.3* 10.8* 8.3  --   --   NEUTROABS  --  9.1*  --   --   --   HGB 5.0* 7.0* 6.5* 8.7* 8.9*  HCT 16.4* 22.1* 20.1* 26.9* 27.5*  MCV 125.2* 107.3* 107.5*  --   --   PLT 106* 93* 102*  --   --     Basic Metabolic Panel: Recent  Labs  Lab 07/06/22 0934 07/06/22 2100 07/07/22 0304 07/08/22 0237  NA 137 139 138 137  K 3.4* 3.4* 3.3* 4.1  CL 111 108 108 104  CO2 18* 20* 22 22  GLUCOSE 133* 135* 108* 104*  BUN 28* 28* 29* 29*  CREATININE 1.50* 1.74* 1.70* 1.58*  CALCIUM 8.7* 8.6* 8.2* 8.6*  MG 1.5* 1.6*  --  1.7  PHOS  --   --   --  2.1*    GFR: Estimated Creatinine Clearance: 36 mL/min (A) (by C-G formula based on SCr of 1.58 mg/dL (H)). Liver Function Tests: Recent Labs  Lab 07/06/22 2100 07/07/22 0304 07/08/22 0237  AST 28 23  --   ALT 14 13  --   ALKPHOS 54 51  --   BILITOT 1.4* 1.1  --   PROT 5.8* 5.1*  --   ALBUMIN 2.7* 2.3* 2.5*    No results for input(s): "LIPASE", "AMYLASE" in the last 168 hours. No results for input(s): "AMMONIA" in the last 168 hours. Coagulation  Profile: Recent Labs  Lab 07/06/22 0936 07/06/22 1700 07/06/22 2100 07/08/22 0237  INR 1.6* 1.5* 1.4* 1.2    Cardiac Enzymes: No results for input(s): "CKTOTAL", "CKMB", "CKMBINDEX", "TROPONINI" in the last 168 hours. BNP (last 3 results) No results for input(s): "PROBNP" in the last 8760 hours. HbA1C: No results for input(s): "HGBA1C" in the last 72 hours. CBG: No results for input(s): "GLUCAP" in the last 168 hours. Lipid Profile: No results for input(s): "CHOL", "HDL", "LDLCALC", "TRIG", "CHOLHDL", "LDLDIRECT" in the last 72 hours. Thyroid Function Tests: No results for input(s): "TSH", "T4TOTAL", "FREET4", "T3FREE", "THYROIDAB" in the last 72 hours. Anemia Panel: Recent Labs    07/06/22 1101 07/06/22 2057  VITAMINB12  --  513  FOLATE 7.3  --   FERRITIN 172  --   TIBC 346  --   IRON 216*  --   RETICCTPCT  --  7.0*    Urine analysis: No results found for: "COLORURINE", "APPEARANCEUR", "LABSPEC", "PHURINE", "GLUCOSEU", "HGBUR", "BILIRUBINUR", "KETONESUR", "PROTEINUR", "UROBILINOGEN", "NITRITE", "LEUKOCYTESUR" Sepsis Labs: @LABRCNTIP (procalcitonin:4,lacticidven:4)  )No results found for this or any  previous visit (from the past 240 hour(s)).       Radiology Studies: CT ABDOMEN PELVIS W CONTRAST  Result Date: 07/07/2022 CLINICAL DATA:  Portal vein thrombosis and cirrhosis. Possible portal vein occlusion. EXAM: CT ABDOMEN AND PELVIS WITH CONTRAST TECHNIQUE: Multidetector CT imaging of the abdomen and pelvis was performed using the standard protocol following bolus administration of intravenous contrast. RADIATION DOSE REDUCTION: This exam was performed according to the departmental dose-optimization program which includes automated exposure control, adjustment of the mA and/or kV according to patient size and/or use of iterative reconstruction technique. CONTRAST:  60mL OMNIPAQUE IOHEXOL 350 MG/ML SOLN COMPARISON:  Abdominal ultrasound 07/06/2022. PET-CT 02/13/2022. CT abdomen and pelvis 12/06/2021. MRI abdomen 12/16/2021 FINDINGS: Lower chest: Large bilateral pleural effusions with basilar consolidation or atelectasis. Small right pericardial cyst is unchanged since prior study. Hepatobiliary: Hepatic cirrhosis with enlarged lateral segment left lobe of liver and nodular contour to the liver. No focal lesions are identified. The contrast bolus is poor, limiting the evaluation, but there appears to be flow demonstrated in the portal veins without obvious thrombosis. Gallbladder wall edema is likely related to liver disease. There is a stent in the common bile duct with associated pneumobilia. Pancreas: Unremarkable. No pancreatic ductal dilatation or surrounding inflammatory changes. Spleen: Normal in size without focal abnormality. Adrenals/Urinary Tract: No adrenal gland nodules. Bilateral parapelvic cysts. No imaging follow-up is indicated. No hydronephrosis or hydroureter. Bladder is normal. Stomach/Bowel: Stomach, small bowel, and colon are not abnormally distended. Contrast material throughout most of the colon. 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 Vascular/Lymphatic: Aortic atherosclerosis. No enlarged abdominal or pelvic lymph nodes. Reproductive: Prostate is unremarkable. Other: No free air or free fluid in the abdomen. Mild infiltration in the mesentery, pericolic gutters, and pelvis. This could be early ascites or inflammatory reaction. There is a small supraumbilical hernia containing fat with increased density in the fat suggesting fat necrosis. No change since prior study. Edema in the subcutaneous fat throughout the abdomen and pelvis. Musculoskeletal: Degenerative changes in the spine. Compression of the L1 vertebra, unchanged. Mild anterior subluxation of L4 on L5 is unchanged, likely degenerative. IMPRESSION: 1. Hepatic cirrhosis. No focal lesions. The contrast bolus is somewhat limited but no obvious portal venous thrombosis is demonstrated in the central portal veins appear to have flow. 2. Gallbladder wall edema is likely due to  liver disease. Bile duct stent with pneumobilia. 3. 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 4. Diffuse edema demonstrated in the subcutaneous fat, mesentery, and intra peritoneum. 5. Aortic atherosclerosis. 6. Small supraumbilical hernia with fat herniation and fat necrosis. 7. Large bilateral pleural effusions with basilar atelectasis or consolidation. Electronically Signed   By: Burman Nieves M.D.   On: 07/07/2022 19:42   ECHOCARDIOGRAM COMPLETE  Result Date: 07/07/2022    ECHOCARDIOGRAM REPORT   Patient Name:   Carl Woods Date of Exam: 07/07/2022 Medical Rec #:  [id number]    Height:       68.0 in Accession #:    [id number]   Weight:       215.6 lb Date of Birth:  November 06, 1931    BSA:          2.110 m Patient Age:    89 years     BP:           104/52 mmHg Patient Gender: M            HR:           74 bpm. Exam Location:  Inpatient Procedure: 2D Echo, Cardiac Doppler, Color Doppler and Intracardiac             Opacification Agent Indications:    NSTEMI, Chest Pain  History:        Patient has prior history of Echocardiogram examinations, most                 recent 06/18/2021. Cancer, Arrythmias:Atrial Fibrillation,                 Signs/Symptoms:Shortness of Breath and Chest Pain; Risk                 Factors:Former Smoker and Sleep Apnea.  Sonographer:    Wallie Char Referring Phys: [id number] Tessa Lerner  Sonographer Comments: Technically difficult study due to poor echo windows. Image acquisition challenging due to respiratory motion. IMPRESSIONS  1. Left ventricular ejection fraction, by estimation, is 60 to 65%. The left ventricle has normal function. The left ventricle has no regional wall motion abnormalities. Left ventricular diastolic function could not be evaluated. Elevated left atrial pressure. The E/e' is 16.5.  2. Right ventricular systolic function is low normal. The right ventricular size is mildly enlarged. There is moderately elevated pulmonary artery systolic pressure. The estimated right ventricular systolic pressure is 53.4 mmHg.  3. Right atrial size was dilated.  4. A small pericardial effusion is present. There is no evidence of cardiac tamponade.  5. The mitral valve is degenerative. Trivial mitral valve regurgitation. No evidence of mitral stenosis.  6. The aortic valve is grossly normal. Aortic valve regurgitation is not visualized. Aortic valve sclerosis is present, with no evidence of aortic valve stenosis.  7. The inferior vena cava is dilated in size with <50% respiratory variability, suggesting right atrial pressure of 15 mmHg.  8. Rhythm strip during this exam demonstrates atrial fibrillation. Comparison(s): Outside echo 06/18/2021: LVEF 55-60%,mild TR, RVSP . FINDINGS  Left Ventricle: Left ventricular ejection fraction, by estimation, is 60 to 65%. The left ventricle has normal function. The left ventricle has no regional wall motion abnormalities. Definity contrast agent  was given IV to delineate the left ventricular  endocardial borders. The left ventricular internal cavity size was normal in size. There is no left ventricular hypertrophy. Left ventricular diastolic function could not be evaluated due to  atrial fibrillation. Left ventricular diastolic function could  not be evaluated. Elevated left atrial pressure. The E/e' is 16.5. Right Ventricle: The right ventricular size is mildly enlarged. Right vetricular wall thickness was not well visualized. Right ventricular systolic function is low normal. There is moderately elevated pulmonary artery systolic pressure. The tricuspid regurgitant velocity is 3.10 m/s, and with an assumed right atrial pressure of 15 mmHg, the estimated right ventricular systolic pressure is 53.4 mmHg. Left Atrium: Left atrial size was normal in size. Right Atrium: Right atrial size was dilated. Pericardium: A small pericardial effusion is present. There is no evidence of cardiac tamponade. Mitral Valve: The mitral valve is degenerative in appearance. Trivial mitral valve regurgitation. No evidence of mitral valve stenosis. MV peak gradient, 8.2 mmHg. The mean mitral valve gradient is 2.7 mmHg. Tricuspid Valve: The tricuspid valve is not well visualized. Tricuspid valve regurgitation is mild . No evidence of tricuspid stenosis. Aortic Valve: The aortic valve is grossly normal. Aortic valve regurgitation is not visualized. Aortic valve sclerosis is present, with no evidence of aortic valve stenosis. Aortic valve mean gradient measures 4.2 mmHg. Aortic valve peak gradient measures 8.0 mmHg. Aortic valve area, by VTI measures 3.33 cm. Pulmonic Valve: The pulmonic valve was not well visualized. Pulmonic valve regurgitation is trivial. No evidence of pulmonic stenosis. Aorta: The aortic root and ascending aorta are structurally normal, with no evidence of dilitation. Venous: The inferior vena cava is dilated in size with less than 50% respiratory variability,  suggesting right atrial pressure of 15 mmHg. IAS/Shunts: No atrial level shunt detected by color flow Doppler. EKG: Rhythm strip during this exam demonstrates atrial fibrillation.  LEFT VENTRICLE PLAX 2D LVIDd:         4.20 cm      Diastology LVIDs:         2.90 cm      LV e' medial:    9.73 cm/s LV PW:         1.00 cm      LV E/e' medial:  15.4 LV IVS:        0.60 cm      LV e' lateral:   8.61 cm/s LVOT diam:     2.10 cm      LV E/e' lateral: 17.5 LV SV:         95 LV SV Index:   45 LVOT Area:     3.46 cm  LV Volumes (MOD) LV vol d, MOD A2C: 134.0 ml LV vol d, MOD A4C: 106.0 ml LV vol s, MOD A2C: 43.0 ml LV vol s, MOD A4C: 35.5 ml LV SV MOD A2C:     91.0 ml LV SV MOD A4C:     106.0 ml LV SV MOD BP:      82.9 ml RIGHT VENTRICLE             IVC RV S prime:     12.10 cm/s  IVC diam: 2.60 cm TAPSE (M-mode): 1.3 cm LEFT ATRIUM             Index        RIGHT ATRIUM           Index LA diam:        4.20 cm 1.99 cm/m   RA Area:     21.80 cm LA Vol (A2C):   52.4 ml 24.83 ml/m  RA Volume:   67.40 ml  31.94 ml/m LA Vol (A4C):   53.2 ml 25.21 ml/m LA Biplane  Vol: 54.3 ml 25.73 ml/m  AORTIC VALVE AV Area (Vmax):    3.24 cm AV Area (Vmean):   3.09 cm AV Area (VTI):     3.33 cm AV Vmax:           141.50 cm/s AV Vmean:          94.500 cm/s AV VTI:            0.286 m AV Peak Grad:      8.0 mmHg AV Mean Grad:      4.2 mmHg LVOT Vmax:         132.25 cm/s LVOT Vmean:        84.250 cm/s LVOT VTI:          0.275 m LVOT/AV VTI ratio: 0.96  AORTA Ao Root diam: 3.40 cm Ao Asc diam:  3.30 cm MITRAL VALVE                TRICUSPID VALVE MV Area (PHT): 3.30 cm     TR Peak grad:   38.4 mmHg MV Area VTI:   2.57 cm     TR Vmax:        310.00 cm/s MV Peak grad:  8.2 mmHg MV Mean grad:  2.7 mmHg     SHUNTS MV Vmax:       1.43 m/s     Systemic VTI:  0.28 m MV Vmean:      74.5 cm/s    Systemic Diam: 2.10 cm MV Decel Time: 230 msec MV E velocity: 150.33 cm/s MV A velocity: 73.33 cm/s MV E/A ratio:  2.05 Sunit Tolia DO Electronically signed  by Tessa Lerner DO Signature Date/Time: 07/07/2022/9:52:12 AM    Final    US Abdomen Complete  Result Date: 07/06/2022 CLINICAL DATA:  Cirrhosis. EXAM: ABDOMEN ULTRASOUND COMPLETE COMPARISON:  PET CT 02/13/2022, MRCP 12/16/2021 FINDINGS: Gallbladder: Partially distended. Wall thickening of 6 mm. No gallstones. No sonographic Murphy sign noted by sonographer. Common bile duct: Diameter: Previous common bile duct dilatation is not seen on the current exam, visualized common bile duct measures 4 mm. Pneumobilia on prior PET is not seen by ultrasound. Liver: Heterogeneous hepatic parenchyma, mildly increased. Nodular patter contours. There is no discrete hepatic lesion, although portions of the liver are suboptimally assessed. No flow is demonstrated in the main portal vein. IVC: No abnormality visualized. Pancreas: Visualized portion unremarkable. Spleen: Size and appearance within normal limits. No splenomegaly, greatest splenic length of 10.3 cm. Right Kidney: Length: 8.5 cm. Increased parenchymal echogenicity with renal parenchymal thinning. No hydronephrosis. No evidence of focal lesion. Left Kidney: Length: 8.8 cm. Increased parenchymal echogenicity with renal parenchymal thinning. No hydronephrosis. Renal sinus cysts on prior PET are not well seen. No evidence of stone or focal lesion. Abdominal aorta: No aneurysm visualized. Other findings: No definite abdominal ascites. IMPRESSION: 1. Technically limited exam. Cirrhotic hepatic morphology, no evidence of focal liver lesion. 2. No flow is demonstrated in the main portal vein. It is unclear if this is due to technical factors or true portal vein occlusion. Recommend contrast-enhanced CT for further assessment. 3. Increased renal parenchymal echogenicity with parenchymal thinning typical of chronic medical renal disease. No hydronephrosis. Electronically Signed   By: Narda Rutherford M.D.   On: 07/06/2022 17:32        Scheduled Meds:  bumetanide (BUMEX) IV   1 mg Intravenous Q12H   levothyroxine  112 mcg Oral QAC breakfast   sodium chloride flush  3 mL Intravenous Q12H  Continuous Infusions:  sodium chloride 20 mL/hr at 07/08/22 1322   pantoprazole 8 mg/hr (07/08/22 1325)     LOS: 2 days    Time spent: 35 minutes.    Berton Mount, MD  [facility] Pager #: [phone] 7PM-7AM contact night coverage as above

## 2022-07-08 NOTE — Progress Notes (Signed)
   07/08/22 2354  BiPAP/CPAP/SIPAP  BiPAP/CPAP/SIPAP Pt Type Adult  BiPAP/CPAP/SIPAP Resmed  Reason BIPAP/CPAP not in use Non-compliant (wears nasal pillows at home)

## 2022-07-08 NOTE — Progress Notes (Signed)
Family made aware procedure scheduled for 07/09/2022 0900. Consent signed and placed in pts chart.

## 2022-07-08 NOTE — Progress Notes (Addendum)
Attending physician's note   I have taken a history, reviewed the chart, and examined the patient. I performed a substantive portion of this encounter, including complete performance of at least one of the key components, in conjunction with the APP. I agree with the APP's note, impression, and recommendations with my edits.   Transfused 2 unit PRBCs yesterday with good serologic response and H/H stable 8.9/27.5 this morning.  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  Portal vein appears patent and without any thrombus.  CBD stent in position.  Normal T. bili and liver enzymes yesterday.  Case discussed with Cardiology and Hospitalist services.  Cleared for EGD tomorrow.  - N.p.o. at midnight - EGD tomorrow.  Will have side-viewing duodenoscope available - Diuresis per Cardiology and Hospitalist services - Repeat CXR in the morning - Continue serial CBC checks with blood products as needed per protocol - Eliquis on hold since 6/8   The indications, risks, and benefits of EGD were explained to the patient in detail. Risks include but are not limited to bleeding, perforation, adverse reaction to medications, and cardiopulmonary compromise. Sequelae include but are not limited to the possibility of surgery, hospitalization, and mortality. The patient verbalized understanding and wished to proceed.    Veda Arrellano, DO, FACG [phone] office          Progress Note  Primary GI: Dr. Leone Payor  LOS: 2 days   Chief Complaint: Symptomatic Anemia   Subjective   Patient states he is feeling well and tolerating soft diet without difficulty. Had bowel movements yesterday that he thinks were not dark in color. States he re-positioned himself in the bed without being short of breath which is improvement for him.  Cardiology has cleared patient for further  GI workup.   Objective   Vital signs in last 24 hours: Temp:  [97.6 F (36.4 C)-98.8 F (37.1 C)] 97.6 F (36.4 C) (06/11 1202) Pulse Rate:  [75-88] 84 (06/11 1202) Resp:  [19-25] 21 (06/11 1202) BP: (106-133)/(45-73) 122/55 (06/11 1202) SpO2:  [96 %-100 %] 96 % (06/11 1202) Last BM Date : 07/08/22 Last BM recorded by nurses in past 5 days Stool Type: Type 4 (Like a smooth, soft sausage or snake) (07/08/2022  1:00 AM)  General:   male in no acute distress  Heart:  Regular rate and rhythm; no murmurs Pulm: Clear anteriorly; no wheezing Abdomen: soft, nondistended, normal bowel sounds in all quadrants. Nontender without guarding. No organomegaly appreciated. Extremities:  No edema Neurologic:  Alert and  oriented x4;  No focal deficits.  Psych:  Cooperative. Normal mood and affect.  Intake/Output from previous day: 06/10 0701 - 06/11 0700 In: 939 [P.O.:240; I.V.:33; Blood:666] Out: 2150 [Urine:2150] Intake/Output this shift: Total I/O In: -  Out: 3000 [Urine:3000]  Studies/Results: CT ABDOMEN PELVIS W CONTRAST  Result Date: 07/07/2022 CLINICAL DATA:  Portal vein thrombosis and cirrhosis. Possible portal vein occlusion. EXAM: CT ABDOMEN AND PELVIS WITH CONTRAST TECHNIQUE: Multidetector CT imaging of the abdomen and pelvis was performed using the standard protocol following bolus administration of intravenous contrast. RADIATION DOSE REDUCTION: This exam was performed according to the departmental dose-optimization program which includes automated exposure control, adjustment of the mA and/or kV according to patient size and/or use of iterative reconstruction technique. CONTRAST:  60mL OMNIPAQUE IOHEXOL 350 MG/ML SOLN COMPARISON:  Abdominal ultrasound 07/06/2022. PET-CT 02/13/2022. CT abdomen and pelvis 12/06/2021.  MRI abdomen 12/16/2021 FINDINGS: Lower chest: Large bilateral pleural effusions with basilar consolidation or atelectasis. Small right pericardial cyst is unchanged since  prior study. Hepatobiliary: Hepatic cirrhosis with enlarged lateral segment left lobe of liver and nodular contour to the liver. No focal lesions are identified. The contrast bolus is poor, limiting the evaluation, but there appears to be flow demonstrated in the portal veins without obvious thrombosis. Gallbladder wall edema is likely related to liver disease. There is a stent in the common bile duct with associated pneumobilia. Pancreas: Unremarkable. No pancreatic ductal dilatation or surrounding inflammatory changes. Spleen: Normal in size without focal abnormality. Adrenals/Urinary Tract: No adrenal gland nodules. Bilateral parapelvic cysts. No imaging follow-up is indicated. No hydronephrosis or hydroureter. Bladder is normal. Stomach/Bowel: Stomach, small bowel, and colon are not abnormally distended. Contrast material throughout most of the colon. 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 Vascular/Lymphatic: Aortic atherosclerosis. No enlarged abdominal or pelvic lymph nodes. Reproductive: Prostate is unremarkable. Other: No free air or free fluid in the abdomen. Mild infiltration in the mesentery, pericolic gutters, and pelvis. This could be early ascites or inflammatory reaction. There is a small supraumbilical hernia containing fat with increased density in the fat suggesting fat necrosis. No change since prior study. Edema in the subcutaneous fat throughout the abdomen and pelvis. Musculoskeletal: Degenerative changes in the spine. Compression of the L1 vertebra, unchanged. Mild anterior subluxation of L4 on L5 is unchanged, likely degenerative. IMPRESSION: 1. Hepatic cirrhosis. No focal lesions. The contrast bolus is somewhat limited but no obvious portal venous thrombosis is demonstrated in the central portal veins appear to have flow. 2. Gallbladder wall edema is likely due to liver disease. Bile duct stent with  pneumobilia. 3. 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 4. Diffuse edema demonstrated in the subcutaneous fat, mesentery, and intra peritoneum. 5. Aortic atherosclerosis. 6. Small supraumbilical hernia with fat herniation and fat necrosis. 7. Large bilateral pleural effusions with basilar atelectasis or consolidation. Electronically Signed   By: Burman Nieves M.D.   On: 07/07/2022 19:42   ECHOCARDIOGRAM COMPLETE  Result Date: 07/07/2022    ECHOCARDIOGRAM REPORT   Patient Name:   Carl Woods Date of Exam: 07/07/2022 Medical Rec #:  [id number]    Height:       68.0 in Accession #:    [id number]   Weight:       215.6 lb Date of Birth:  1932-01-08    BSA:          2.110 m Patient Age:    89 years     BP:           104/52 mmHg Patient Gender: M            HR:           74 bpm. Exam Location:  Inpatient Procedure: 2D Echo, Cardiac Doppler, Color Doppler and Intracardiac            Opacification Agent Indications:    NSTEMI, Chest Pain  History:        Patient has prior history of Echocardiogram examinations, most                 recent 06/18/2021. Cancer, Arrythmias:Atrial Fibrillation,                 Signs/Symptoms:Shortness of Breath and Chest Pain; Risk  Factors:Former Smoker and Sleep Apnea.  Sonographer:    Wallie Char Referring Phys: [id number] Tessa Lerner  Sonographer Comments: Technically difficult study due to poor echo windows. Image acquisition challenging due to respiratory motion. IMPRESSIONS  1. Left ventricular ejection fraction, by estimation, is 60 to 65%. The left ventricle has normal function. The left ventricle has no regional wall motion abnormalities. Left ventricular diastolic function could not be evaluated. Elevated left atrial pressure. The E/e' is 16.5.  2. Right ventricular systolic function is low normal. The right ventricular size is mildly enlarged. There is moderately elevated pulmonary artery  systolic pressure. The estimated right ventricular systolic pressure is 53.4 mmHg.  3. Right atrial size was dilated.  4. A small pericardial effusion is present. There is no evidence of cardiac tamponade.  5. The mitral valve is degenerative. Trivial mitral valve regurgitation. No evidence of mitral stenosis.  6. The aortic valve is grossly normal. Aortic valve regurgitation is not visualized. Aortic valve sclerosis is present, with no evidence of aortic valve stenosis.  7. The inferior vena cava is dilated in size with <50% respiratory variability, suggesting right atrial pressure of 15 mmHg.  8. Rhythm strip during this exam demonstrates atrial fibrillation. Comparison(s): Outside echo 06/18/2021: LVEF 55-60%,mild TR, RVSP . FINDINGS  Left Ventricle: Left ventricular ejection fraction, by estimation, is 60 to 65%. The left ventricle has normal function. The left ventricle has no regional wall motion abnormalities. Definity contrast agent was given IV to delineate the left ventricular  endocardial borders. The left ventricular internal cavity size was normal in size. There is no left ventricular hypertrophy. Left ventricular diastolic function could not be evaluated due to atrial fibrillation. Left ventricular diastolic function could  not be evaluated. Elevated left atrial pressure. The E/e' is 16.5. Right Ventricle: The right ventricular size is mildly enlarged. Right vetricular wall thickness was not well visualized. Right ventricular systolic function is low normal. There is moderately elevated pulmonary artery systolic pressure. The tricuspid regurgitant velocity is 3.10 m/s, and with an assumed right atrial pressure of 15 mmHg, the estimated right ventricular systolic pressure is 53.4 mmHg. Left Atrium: Left atrial size was normal in size. Right Atrium: Right atrial size was dilated. Pericardium: A small pericardial effusion is present. There is no evidence of cardiac tamponade. Mitral Valve: The  mitral valve is degenerative in appearance. Trivial mitral valve regurgitation. No evidence of mitral valve stenosis. MV peak gradient, 8.2 mmHg. The mean mitral valve gradient is 2.7 mmHg. Tricuspid Valve: The tricuspid valve is not well visualized. Tricuspid valve regurgitation is mild . No evidence of tricuspid stenosis. Aortic Valve: The aortic valve is grossly normal. Aortic valve regurgitation is not visualized. Aortic valve sclerosis is present, with no evidence of aortic valve stenosis. Aortic valve mean gradient measures 4.2 mmHg. Aortic valve peak gradient measures 8.0 mmHg. Aortic valve area, by VTI measures 3.33 cm. Pulmonic Valve: The pulmonic valve was not well visualized. Pulmonic valve regurgitation is trivial. No evidence of pulmonic stenosis. Aorta: The aortic root and ascending aorta are structurally normal, with no evidence of dilitation. Venous: The inferior vena cava is dilated in size with less than 50% respiratory variability, suggesting right atrial pressure of 15 mmHg. IAS/Shunts: No atrial level shunt detected by color flow Doppler. EKG: Rhythm strip during this exam demonstrates atrial fibrillation.  LEFT VENTRICLE PLAX 2D LVIDd:         4.20 cm      Diastology LVIDs:  2.90 cm      LV e' medial:    9.73 cm/s LV PW:         1.00 cm      LV E/e' medial:  15.4 LV IVS:        0.60 cm      LV e' lateral:   8.61 cm/s LVOT diam:     2.10 cm      LV E/e' lateral: 17.5 LV SV:         95 LV SV Index:   45 LVOT Area:     3.46 cm  LV Volumes (MOD) LV vol d, MOD A2C: 134.0 ml LV vol d, MOD A4C: 106.0 ml LV vol s, MOD A2C: 43.0 ml LV vol s, MOD A4C: 35.5 ml LV SV MOD A2C:     91.0 ml LV SV MOD A4C:     106.0 ml LV SV MOD BP:      82.9 ml RIGHT VENTRICLE             IVC RV S prime:     12.10 cm/s  IVC diam: 2.60 cm TAPSE (M-mode): 1.3 cm LEFT ATRIUM             Index        RIGHT ATRIUM           Index LA diam:        4.20 cm 1.99 cm/m   RA Area:     21.80 cm LA Vol (A2C):   52.4 ml 24.83  ml/m  RA Volume:   67.40 ml  31.94 ml/m LA Vol (A4C):   53.2 ml 25.21 ml/m LA Biplane Vol: 54.3 ml 25.73 ml/m  AORTIC VALVE AV Area (Vmax):    3.24 cm AV Area (Vmean):   3.09 cm AV Area (VTI):     3.33 cm AV Vmax:           141.50 cm/s AV Vmean:          94.500 cm/s AV VTI:            0.286 m AV Peak Grad:      8.0 mmHg AV Mean Grad:      4.2 mmHg LVOT Vmax:         132.25 cm/s LVOT Vmean:        84.250 cm/s LVOT VTI:          0.275 m LVOT/AV VTI ratio: 0.96  AORTA Ao Root diam: 3.40 cm Ao Asc diam:  3.30 cm MITRAL VALVE                TRICUSPID VALVE MV Area (PHT): 3.30 cm     TR Peak grad:   38.4 mmHg MV Area VTI:   2.57 cm     TR Vmax:        310.00 cm/s MV Peak grad:  8.2 mmHg MV Mean grad:  2.7 mmHg     SHUNTS MV Vmax:       1.43 m/s     Systemic VTI:  0.28 m MV Vmean:      74.5 cm/s    Systemic Diam: 2.10 cm MV Decel Time: 230 msec MV E velocity: 150.33 cm/s MV A velocity: 73.33 cm/s MV E/A ratio:  2.05 Sunit Tolia DO Electronically signed by Tessa Lerner DO Signature Date/Time: 07/07/2022/9:52:12 AM    Final    US Abdomen Complete  Result Date: 07/06/2022 CLINICAL DATA:  Cirrhosis. EXAM: ABDOMEN ULTRASOUND COMPLETE COMPARISON:  PET CT 02/13/2022, MRCP 12/16/2021 FINDINGS: Gallbladder: Partially distended. Wall thickening of 6 mm. No gallstones. No sonographic Murphy sign noted by sonographer. Common bile duct: Diameter: Previous common bile duct dilatation is not seen on the current exam, visualized common bile duct measures 4 mm. Pneumobilia on prior PET is not seen by ultrasound. Liver: Heterogeneous hepatic parenchyma, mildly increased. Nodular patter contours. There is no discrete hepatic lesion, although portions of the liver are suboptimally assessed. No flow is demonstrated in the main portal vein. IVC: No abnormality visualized. Pancreas: Visualized portion unremarkable. Spleen: Size and appearance within normal limits. No splenomegaly, greatest splenic length of 10.3 cm. Right Kidney:  Length: 8.5 cm. Increased parenchymal echogenicity with renal parenchymal thinning. No hydronephrosis. No evidence of focal lesion. Left Kidney: Length: 8.8 cm. Increased parenchymal echogenicity with renal parenchymal thinning. No hydronephrosis. Renal sinus cysts on prior PET are not well seen. No evidence of stone or focal lesion. Abdominal aorta: No aneurysm visualized. Other findings: No definite abdominal ascites. IMPRESSION: 1. Technically limited exam. Cirrhotic hepatic morphology, no evidence of focal liver lesion. 2. No flow is demonstrated in the main portal vein. It is unclear if this is due to technical factors or true portal vein occlusion. Recommend contrast-enhanced CT for further assessment. 3. Increased renal parenchymal echogenicity with parenchymal thinning typical of chronic medical renal disease. No hydronephrosis. Electronically Signed   By: Narda Rutherford M.D.   On: 07/06/2022 17:32    Lab Results: Recent Labs    07/06/22 0934 07/06/22 2100 07/07/22 0304 07/07/22 1733 07/08/22 0818  WBC 12.3* 10.8* 8.3  --   --   HGB 5.0* 7.0* 6.5* 8.7* 8.9*  HCT 16.4* 22.1* 20.1* 26.9* 27.5*  PLT 106* 93* 102*  --   --    BMET Recent Labs    07/06/22 2100 07/07/22 0304 07/08/22 0237  NA 139 138 137  K 3.4* 3.3* 4.1  CL 108 108 104  CO2 20* 22 22  GLUCOSE 135* 108* 104*  BUN 28* 29* 29*  CREATININE 1.74* 1.70* 1.58*  CALCIUM 8.6* 8.2* 8.6*   LFT Recent Labs    07/07/22 0304 07/08/22 0237  PROT 5.1*  --   ALBUMIN 2.3* 2.5*  AST 23  --   ALT 13  --   ALKPHOS 51  --   BILITOT 1.1  --    PT/INR Recent Labs    07/06/22 2100 07/08/22 0237  LABPROT 17.6* 15.7*  INR 1.4* 1.2     Scheduled Meds:  bumetanide (BUMEX) IV  1 mg Intravenous Q12H   levothyroxine  112 mcg Oral QAC breakfast   sodium chloride flush  3 mL Intravenous Q12H   Continuous Infusions:  pantoprazole 8 mg/hr (07/08/22 0343)      Patient profile:   Carl Woods is a 89 y.o. male with  past medical history significant for past medical history as listed below including A-fib on Eliquis, Barrett's esophagus, diverticulosis, GERD presents for symptomatic anemia.   Patient has a history of cancer of ampulla of Vater diagnosed 12/2021 due to abnormal scan and elevated LFTs.  Follows with Dr. Mosetta Putt. See GI workup below.  Patient is not a candidate for Whipple surgery.  Is undergoing radiation without chemotherapy (last radiation dose 03/2022). Scheduled for repeat ERCP 08/20/22   Presented with melena x 3 months (on iron), shortness of breath, and chest pain.  Hgb upon admission 5.0   Impression:   Symptomatic anemia Hgb 8.9, HCT 27.5 BUN 29, creatinine 1.58, GFR  42 PT 15.7, INR 1.2, improved Iron 216, TIBC 346, saturation 63% Ferritin 172 Fecal occult negative   Nodular liver January 2024/Cirrhosis (?) normal LFTs platelets 102 previous known fatty liver Ceruloplasmin 25.2 Negative HCV, Hep A, Hep B. Not immune to Hep B, will need immunizations Alpha 1 anti-trypsin 206 ANA negative ASMA pending MELD 3.0: 16 CT ab/pelvis w contrast: cirrhosis. No obvious portal venous thrombosis. Gallbladder wall edema secondary to liver disease. Bile duct stent with pneumobilia, portal hypertensive colopathy, large bilateral pleural effusions.  A-fib on Eliquis -Last dose 6/9 AM   Cancer of the ampulla of Vader   Plan:   - Patient has been cleared from cardiology for further GI workup. However, CT shows large bilateral pleural effusions. Negative for portal vein thrombosis - continue to diurese one more day - will put on schedule for tomorrow and get repeat CXR early AM prior to procedure - Continue daily CBC and transfuse as needed to maintain HGB > 7   - Continue to hold anticoagulation  Bayley Leanna Sato  07/08/2022, 12:20 PM

## 2022-07-08 NOTE — TOC Progression Note (Addendum)
Transition of Care [facility]) - Progression Note    Patient Details  Name: Carl Woods MRN: [id number] Date of Birth: 1931/06/06  [facility]) CM/SW Contact  [name], LCSWA Phone Number: 07/08/2022, 11:24 AM  Clinical Narrative:     CSW spoke with patient at bedside and provided medicare compare ratings list of accepted SNF bed offers. Patient reviewed. Patient currently still unsure of dc plan. Patient informed CSW if he does decide to go to rehab his number one choice would be [facility]. Patient requested for CSW to call his daughter [name] to discuss his dc plan. CSW spoke with [name] and discussed PT recommendations for patient with her. Patients daughter informed CSW she is going to speak with patient and her brothers on plan. CSW plans to follow up with patient this afternoon on dc plan. CSW spoke with [name] at [facility] who confirmed they can offer SNF bed for patient. CSW informed patient and patients daughter. Patients daughter informed CSW she can bring patients cpap from home to facility if plan is for SNF.CSW will continue to follow.  Update- CSW spoke with patient and patients son [name] at bedside. Patient agreeable to SNF placement at dc. Patient accepted SNF bed offer with [facility]. [name] with [facility] confirmed SNF bed for patient. CSW Lvm with [facility] to try to start insurance authorization for patient for SNF and PTAR. CSW awaiting call back. CSW will continue to follow.  Expected Discharge Plan: Home w Home Health Services Barriers to Discharge: Continued Medical Work up  Expected Discharge Plan and Services   Discharge Planning Services: CM Consult   Living arrangements for the past 2 months: Single Family Home                                       Social Determinants of Health (SDOH) Interventions SDOH Screenings   Food Insecurity: No Food Insecurity (02/18/2022)  Housing: Low Risk  (02/18/2022)  Transportation Needs: No Transportation Needs  (02/18/2022)  Utilities: Not At Risk (02/18/2022)  Depression (PHQ2-9): Low Risk  (02/18/2022)  Tobacco Use: Medium Risk (07/06/2022)    Readmission Risk Interventions    07/07/2022    1:52 PM  Readmission Risk Prevention Plan  Transportation Screening Complete  PCP or Specialist Appt within 3-5 Days Complete  HRI or Home Care Consult Complete  Social Work Consult for Recovery Care Planning/Counseling Complete  Palliative Care Screening Not Applicable  Medication Review [employer]) Complete

## 2022-07-08 NOTE — Progress Notes (Addendum)
Subjective:  No chest pain   Current Facility-Administered Medications:    acetaminophen (TYLENOL) tablet 650 mg, 650 mg, Oral, Q6H PRN **OR** acetaminophen (TYLENOL) suppository 650 mg, 650 mg, Rectal, Q6H PRN, Synetta Fail, MD   bumetanide (BUMEX) injection 1 mg, 1 mg, Intravenous, Q12H, Tolia, Sunit, DO, 1 mg at 07/08/22 4098   levothyroxine (SYNTHROID) tablet 112 mcg, 112 mcg, Oral, QAC breakfast, Synetta Fail, MD, 112 mcg at 07/08/22 0533   pantoprozole (PROTONIX) 80 mg /NS 100 mL infusion, 8 mg/hr, Intravenous, Continuous, Synetta Fail, MD, Last Rate: 10 mL/hr at 07/08/22 0343, 8 mg/hr at 07/08/22 0343   sodium chloride flush (NS) 0.9 % injection 3 mL, 3 mL, Intravenous, Q12H, Synetta Fail, MD, 3 mL at 07/07/22 2206   Objective:  Vital Signs in the last 24 hours: Temp:  [98.1 F (36.7 C)-98.8 F (37.1 C)] 98.5 F (36.9 C) (06/11 0800) Pulse Rate:  [75-97] 87 (06/11 0800) Resp:  [19-25] 20 (06/11 0800) BP: (106-133)/(45-73) 124/56 (06/11 0800) SpO2:  [96 %-100 %] 100 % (06/11 0800)  Intake/Output from previous day: 06/10 0701 - 06/11 0700 In: 939 [P.O.:240; I.V.:33; Blood:666] Out: 2150 [Urine:2150]  Physical Exam Vitals and nursing note reviewed.  Constitutional:      General: He is not in acute distress. Neck:     Vascular: No JVD.  Cardiovascular:     Rate and Rhythm: Normal rate. Rhythm irregular.     Heart sounds: Normal heart sounds. No murmur heard. Pulmonary:     Effort: Pulmonary effort is normal.     Breath sounds: Examination of the right-lower field reveals decreased breath sounds. Examination of the left-lower field reveals decreased breath sounds. Decreased breath sounds present. No wheezing or rales.  Musculoskeletal:     Right lower leg: Edema present.     Left lower leg: Edema present.      Imaging/tests reviewed and independently interpreted: 1. Hepatic cirrhosis. No focal lesions. The contrast bolus is somewhat  limited but no obvious portal venous thrombosis is demonstrated in the central portal veins appear to have flow. 2. Gallbladder wall edema is likely due to liver disease. Bile duct stent with pneumobilia. 3. 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 4. Diffuse edema demonstrated in the subcutaneous fat, mesentery, and intra peritoneum. 5. Aortic atherosclerosis. 6. Small supraumbilical hernia with fat herniation and fat necrosis. 7. Large bilateral pleural effusions with basilar atelectasis or consolidation.    Cardiac Studies:  EKG 07/07/2022: Atrial fibrillation Low voltage QRS Nonspecific T wave abnormality  Echocardiogram 07/07/2022:  1. Left ventricular ejection fraction, by estimation, is 60 to 65%. The left ventricle has normal function. The left ventricle has no regional  wall motion abnormalities. Left ventricular diastolic function could not be evaluated. Elevated left atrial  pressure. The E/e' is 16.5.   2. Right ventricular systolic function is low normal. The right ventricular size is mildly enlarged. There is moderately elevated pulmonary artery systolic pressure. The estimated right ventricular systolic pressure is 53.4 mmHg.   3. Right atrial size was dilated.   4. A small pericardial effusion is present. There is no evidence of cardiac tamponade.   5. The mitral valve is degenerative. Trivial mitral valve regurgitation. No evidence of mitral stenosis.   6. The aortic valve is grossly normal. Aortic valve regurgitation is not visualized. Aortic valve sclerosis is present, with no evidence of aortic valve stenosis.   7. The inferior vena cava  is dilated in size with <50% respiratory variability, suggesting right atrial pressure of 15 mmHg.   8. Rhythm strip during this exam demonstrates atrial fibrillation.   Comparison(s): Outside echo 06/18/2021: LVEF 55-60%,mild TR, RVSP .     Assessment  & Recommendations:  89 y/o Caucasian male with hypertension, type 2 DM, emphysema, OSA on CPAP, permanent Afib,  Barrett's esophagus, diverticulosis, history of cancer of ampulla of Vater, admitted with symptomatic acute blood loss anemia. Cardiology following for EKG cahnges, troponin elevation  Abnormal EKG, elevated troponin: Type 2 MI due to acute blood loss anemia. Echocardiogram with preserved LVEFM, no WMA.  Continue management for acute blood loss anemia. Do not recommend invasive coronary workup at this time.  His cardiac issues are otherwise chronic, with underlying atrial fibrillation and pulmonary hypertension.  At this time, he is stable to proceed with necessary GI workup.  HFpEF: Secondary to Afib, exacerbated in the setting of acute blood loss anemia.  Net -ve 5.3 L. Continue IV bumex 1 mg bid. Will transition to PO before discharge.   Permanent atrial fibrillation: Rate controlled. Anticoagulation on hold due to significant.   Discussed interpretation of tests and management recommendations with the primary team   Elder Negus, MD Pager: [phone] Office: [phone]

## 2022-07-08 NOTE — Evaluation (Signed)
Occupational Therapy Evaluation Patient Details Name: Carl Woods MRN: [id number] DOB: [birth year] Today's Date: 07/08/2022   History of Present Illness Patient is 89 y.o. male who presents with a chief complaint of chest pain and shortness of breath. Cardiology consulted and feels NSTEMI likely secondary to supply demand ischemia/symptomatic anemia. 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   Clinical Impression   Pt s/p above diagnosis. Pt in good spirits, motivated to participate. Son present during session, and doctors entered room to share POC during session. Pt PLOF lives at home with wife who has hx of dementia and uses RW, does not have physical support at home. Pt reports being independent without DME at home with ADLs. Pt CLOF min A for sit to stand and ambulation, able to take 2-3 steps before losing balance, not able to take steps backwards safely due to LOB. Pt requires significant assistance for dressing/bathing/toileting and would benefit from post acute therapy <3hrs/day to improve functional strength and return to PLOF. Pt will be seen acutely during stay.     Recommendations for follow up therapy are one component of a multi-disciplinary discharge planning process, led by the attending physician.  Recommendations may be updated based on patient status, additional functional criteria and insurance authorization.   Assistance Recommended at Discharge Frequent or constant Supervision/Assistance  Patient can return home with the following A little help with walking and/or transfers;A little help with bathing/dressing/bathroom;Assistance with cooking/housework;Assist for transportation;Help with stairs or ramp for entrance    Functional Status Assessment  Patient has had a recent decline in their functional status and demonstrates the  ability to make significant improvements in function in a reasonable and predictable amount of time.  Equipment Recommendations  Other (comment) (defer)    Recommendations for Other Services       Precautions / Restrictions Precautions Precautions: Fall Restrictions Weight Bearing Restrictions: No      Mobility Bed Mobility Overal bed mobility: Needs Assistance Bed Mobility: Supine to Sit, Sit to Supine     Supine to sit: Supervision, HOB elevated Sit to supine: Supervision   General bed mobility comments: verbal cues for bed positoning, HOB elevated, and scooting to EOB    Transfers Overall transfer level: Needs assistance Equipment used: Rolling walker (2 wheels) Transfers: Sit to/from Stand Sit to Stand: Min assist           General transfer comment: strong min A to power STS, min A for balance during transfer      Balance Overall balance assessment: Needs assistance, History of Falls Sitting-balance support: Feet supported Sitting balance-Leahy Scale: Good Sitting balance - Comments: able to perform ADLs at EOB   Standing balance support: Bilateral upper extremity supported, During functional activity, Reliant on assistive device for balance Standing balance-Leahy Scale: Poor Standing balance comment: two instances of LOB during standing, taking steps forwards/backwards                           ADL either performed or assessed with clinical judgement   ADL Overall ADL's : Needs assistance/impaired Eating/Feeding: Set up   Grooming: Set up;Sitting   Upper Body Bathing: Minimal assistance   Lower Body Bathing: Minimal assistance   Upper Body Dressing : Set up;Sitting   Lower Body Dressing: Minimal assistance;Sit to/from stand   Toilet Transfer: Minimal assistance;BSC/3in1;Rolling walker (2 wheels)   Toileting-  Clothing Manipulation and Hygiene: Moderate assistance       Functional mobility during ADLs: Minimal assistance;Rolling  walker (2 wheels) General ADL Comments: Pt able to don/doff B socks, unable to stand without assist, not able to don pants while standing, reliant on RW, 2 instances of LOB during standing     Vision         Perception     Praxis      Pertinent Vitals/Pain Pain Assessment Pain Assessment: No/denies pain     Hand Dominance Left   Extremity/Trunk Assessment Upper Extremity Assessment Upper Extremity Assessment: Overall WFL for tasks assessed           Communication     Cognition Arousal/Alertness: Awake/alert Behavior During Therapy: WFL for tasks assessed/performed Overall Cognitive Status: Within Functional Limits for tasks assessed                                       General Comments       Exercises     Shoulder Instructions      Home Living Family/patient expects to be discharged to:: Private residence Living Arrangements: Spouse/significant other Available Help at Discharge: Family Type of Home: House Home Access: Stairs to enter [employer] of Steps: 1+1   Home Layout: One level;Able to live on main level with bedroom/bathroom;Full bath on main level     Bathroom Shower/Tub: Producer, television/film/video: Handicapped height Bathroom Accessibility: Yes How Accessible: Accessible via walker Home Equipment: Shower seat;Grab bars - tub/shower   Additional Comments: pt's wife Okey Regal has dementia, pt's son lives in [place] part time and pt has a daughter in [place]. States his kids are all helping with pt's wife and staying overnight.      Prior Functioning/Environment Prior Level of Function : Independent/Modified Independent               ADLs Comments: reports was independent with everything, dressing/bathing and homemaking.        OT Problem List: Decreased strength;Decreased range of motion;Decreased activity tolerance;Impaired balance (sitting and/or standing);Decreased coordination;Decreased safety awareness       OT Treatment/Interventions: Self-care/ADL training;Therapeutic exercise;Energy conservation;DME and/or AE instruction;Therapeutic activities;Patient/family education;Balance training    OT Goals(Current goals can be found in the care plan section) Acute Rehab OT Goals Patient Stated Goal: to improve strength and complete therapy to eventually go home OT Goal Formulation: With patient Time For Goal Achievement: 07/22/22 Potential to Achieve Goals: Good  OT Frequency: Min 2X/week    Co-evaluation              AM-PAC OT "6 Clicks" Daily Activity     Outcome Measure Help from another person eating meals?: None Help from another person taking care of personal grooming?: A Little Help from another person toileting, which includes using toliet, bedpan, or urinal?: A Lot Help from another person bathing (including washing, rinsing, drying)?: A Little Help from another person to put on and taking off regular upper body clothing?: A Little Help from another person to put on and taking off regular lower body clothing?: A Little 6 Click Score: 18   End of Session Equipment Utilized During Treatment: Gait belt;Rolling walker (2 wheels) Nurse Communication: Mobility status  Activity Tolerance: Patient tolerated treatment well Patient left: in bed;with call bell/phone within reach;with bed alarm set;with family/visitor present  OT Visit Diagnosis: Unsteadiness on feet (R26.81);Other abnormalities of gait  and mobility (R26.89);Muscle weakness (generalized) (M62.81);History of falling (Z91.81)                Time: 1340-1413 OT Time Calculation (min): 33 min Charges:  OT General Charges $OT Visit: 1 Visit OT Evaluation $OT Eval Moderate Complexity: 1 Mod OT Treatments $Self Care/Home Management : 8-22 mins  [address] OTR/L   Alexis Goodell 07/08/2022, 2:22 PM

## 2022-07-08 NOTE — H&P (View-Only) (Signed)
   Attending physician's note   I have taken a history, reviewed the chart, and examined the patient. I performed a substantive portion of this encounter, including complete performance of at least one of the key components, in conjunction with the APP. I agree with the APP's note, impression, and recommendations with my edits.   Transfused 2 unit PRBCs yesterday with good serologic response and H/H stable 8.9/27.5 this morning.  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  Portal vein appears patent and without any thrombus.  CBD stent in position.  Normal T. bili and liver enzymes yesterday.  Case discussed with Cardiology and Hospitalist services.  Cleared for EGD tomorrow.  - N.p.o. at midnight - EGD tomorrow.  Will have side-viewing duodenoscope available - Diuresis per Cardiology and Hospitalist services - Repeat CXR in the morning - Continue serial CBC checks with blood products as needed per protocol - Eliquis on hold since 6/8   The indications, risks, and benefits of EGD were explained to the patient in detail. Risks include but are not limited to bleeding, perforation, adverse reaction to medications, and cardiopulmonary compromise. Sequelae include but are not limited to the possibility of surgery, hospitalization, and mortality. The patient verbalized understanding and wished to proceed.    Zhamir Pirro, DO, FACG [phone] office          Progress Note  Primary GI: Dr. Gessner  LOS: 2 days   Chief Complaint: Symptomatic Anemia   Subjective   Patient states he is feeling well and tolerating soft diet without difficulty. Had bowel movements yesterday that he thinks were not dark in color. States he re-positioned himself in the bed without being short of breath which is improvement for him.  Cardiology has cleared patient for further  GI workup.   Objective   Vital signs in last 24 hours: Temp:  [97.6 F (36.4 C)-98.8 F (37.1 C)] 97.6 F (36.4 C) (06/11 1202) Pulse Rate:  [75-88] 84 (06/11 1202) Resp:  [19-25] 21 (06/11 1202) BP: (106-133)/(45-73) 122/55 (06/11 1202) SpO2:  [96 %-100 %] 96 % (06/11 1202) Last BM Date : 07/08/22 Last BM recorded by nurses in past 5 days Stool Type: Type 4 (Like a smooth, soft sausage or snake) (07/08/2022  1:00 AM)  General:   male in no acute distress  Heart:  Regular rate and rhythm; no murmurs Pulm: Clear anteriorly; no wheezing Abdomen: soft, nondistended, normal bowel sounds in all quadrants. Nontender without guarding. No organomegaly appreciated. Extremities:  No edema Neurologic:  Alert and  oriented x4;  No focal deficits.  Psych:  Cooperative. Normal mood and affect.  Intake/Output from previous day: 06/10 0701 - 06/11 0700 In: 939 [P.O.:240; I.V.:33; Blood:666] Out: 2150 [Urine:2150] Intake/Output this shift: Total I/O In: -  Out: 3000 [Urine:3000]  Studies/Results: CT ABDOMEN PELVIS W CONTRAST  Result Date: 07/07/2022 CLINICAL DATA:  Portal vein thrombosis and cirrhosis. Possible portal vein occlusion. EXAM: CT ABDOMEN AND PELVIS WITH CONTRAST TECHNIQUE: Multidetector CT imaging of the abdomen and pelvis was performed using the standard protocol following bolus administration of intravenous contrast. RADIATION DOSE REDUCTION: This exam was performed according to the departmental dose-optimization program which includes automated exposure control, adjustment of the mA and/or kV according to patient size and/or use of iterative reconstruction technique. CONTRAST:  60mL OMNIPAQUE IOHEXOL 350 MG/ML SOLN COMPARISON:  Abdominal ultrasound 07/06/2022. PET-CT 02/13/2022. CT abdomen and pelvis 12/06/2021.   MRI abdomen 12/16/2021 FINDINGS: Lower chest: Large bilateral pleural effusions with basilar consolidation or atelectasis. Small right pericardial cyst is unchanged since  prior study. Hepatobiliary: Hepatic cirrhosis with enlarged lateral segment left lobe of liver and nodular contour to the liver. No focal lesions are identified. The contrast bolus is poor, limiting the evaluation, but there appears to be flow demonstrated in the portal veins without obvious thrombosis. Gallbladder wall edema is likely related to liver disease. There is a stent in the common bile duct with associated pneumobilia. Pancreas: Unremarkable. No pancreatic ductal dilatation or surrounding inflammatory changes. Spleen: Normal in size without focal abnormality. Adrenals/Urinary Tract: No adrenal gland nodules. Bilateral parapelvic cysts. No imaging follow-up is indicated. No hydronephrosis or hydroureter. Bladder is normal. Stomach/Bowel: Stomach, small bowel, and colon are not abnormally distended. Contrast material throughout most of the colon. 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 Vascular/Lymphatic: Aortic atherosclerosis. No enlarged abdominal or pelvic lymph nodes. Reproductive: Prostate is unremarkable. Other: No free air or free fluid in the abdomen. Mild infiltration in the mesentery, pericolic gutters, and pelvis. This could be early ascites or inflammatory reaction. There is a small supraumbilical hernia containing fat with increased density in the fat suggesting fat necrosis. No change since prior study. Edema in the subcutaneous fat throughout the abdomen and pelvis. Musculoskeletal: Degenerative changes in the spine. Compression of the L1 vertebra, unchanged. Mild anterior subluxation of L4 on L5 is unchanged, likely degenerative. IMPRESSION: 1. Hepatic cirrhosis. No focal lesions. The contrast bolus is somewhat limited but no obvious portal venous thrombosis is demonstrated in the central portal veins appear to have flow. 2. Gallbladder wall edema is likely due to liver disease. Bile duct stent with  pneumobilia. 3. 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 4. Diffuse edema demonstrated in the subcutaneous fat, mesentery, and intra peritoneum. 5. Aortic atherosclerosis. 6. Small supraumbilical hernia with fat herniation and fat necrosis. 7. Large bilateral pleural effusions with basilar atelectasis or consolidation. Electronically Signed   By: William  Stevens M.D.   On: 07/07/2022 19:42   ECHOCARDIOGRAM COMPLETE  Result Date: 07/07/2022    ECHOCARDIOGRAM REPORT   Patient Name:   Carl Woods Date of Exam: 07/07/2022 Medical Rec #:  [id number]    Height:       68.0 in Accession #:    [id number]   Weight:       215.6 lb Date of Birth:  11/30/1931    BSA:          2.110 m Patient Age:    89 years     BP:           104/52 mmHg Patient Gender: M            HR:           74 bpm. Exam Location:  Inpatient Procedure: 2D Echo, Cardiac Doppler, Color Doppler and Intracardiac            Opacification Agent Indications:    NSTEMI, Chest Pain  History:        Patient has prior history of Echocardiogram examinations, most                 recent 06/18/2021. Cancer, Arrythmias:Atrial Fibrillation,                 Signs/Symptoms:Shortness of Breath and Chest Pain; Risk                   Factors:Former Smoker and Sleep Apnea.  Sonographer:    Molly Halloran Referring Phys: [id number] SUNIT TOLIA  Sonographer Comments: Technically difficult study due to poor echo windows. Image acquisition challenging due to respiratory motion. IMPRESSIONS  1. Left ventricular ejection fraction, by estimation, is 60 to 65%. The left ventricle has normal function. The left ventricle has no regional wall motion abnormalities. Left ventricular diastolic function could not be evaluated. Elevated left atrial pressure. The E/e' is 16.5.  2. Right ventricular systolic function is low normal. The right ventricular size is mildly enlarged. There is moderately elevated pulmonary artery  systolic pressure. The estimated right ventricular systolic pressure is 53.4 mmHg.  3. Right atrial size was dilated.  4. A small pericardial effusion is present. There is no evidence of cardiac tamponade.  5. The mitral valve is degenerative. Trivial mitral valve regurgitation. No evidence of mitral stenosis.  6. The aortic valve is grossly normal. Aortic valve regurgitation is not visualized. Aortic valve sclerosis is present, with no evidence of aortic valve stenosis.  7. The inferior vena cava is dilated in size with <50% respiratory variability, suggesting right atrial pressure of 15 mmHg.  8. Rhythm strip during this exam demonstrates atrial fibrillation. Comparison(s): Outside echo 06/18/2021: LVEF 55-60%,mild TR, RVSP 33mmHG. FINDINGS  Left Ventricle: Left ventricular ejection fraction, by estimation, is 60 to 65%. The left ventricle has normal function. The left ventricle has no regional wall motion abnormalities. Definity contrast agent was given IV to delineate the left ventricular  endocardial borders. The left ventricular internal cavity size was normal in size. There is no left ventricular hypertrophy. Left ventricular diastolic function could not be evaluated due to atrial fibrillation. Left ventricular diastolic function could  not be evaluated. Elevated left atrial pressure. The E/e' is 16.5. Right Ventricle: The right ventricular size is mildly enlarged. Right vetricular wall thickness was not well visualized. Right ventricular systolic function is low normal. There is moderately elevated pulmonary artery systolic pressure. The tricuspid regurgitant velocity is 3.10 m/s, and with an assumed right atrial pressure of 15 mmHg, the estimated right ventricular systolic pressure is 53.4 mmHg. Left Atrium: Left atrial size was normal in size. Right Atrium: Right atrial size was dilated. Pericardium: A small pericardial effusion is present. There is no evidence of cardiac tamponade. Mitral Valve: The  mitral valve is degenerative in appearance. Trivial mitral valve regurgitation. No evidence of mitral valve stenosis. MV peak gradient, 8.2 mmHg. The mean mitral valve gradient is 2.7 mmHg. Tricuspid Valve: The tricuspid valve is not well visualized. Tricuspid valve regurgitation is mild . No evidence of tricuspid stenosis. Aortic Valve: The aortic valve is grossly normal. Aortic valve regurgitation is not visualized. Aortic valve sclerosis is present, with no evidence of aortic valve stenosis. Aortic valve mean gradient measures 4.2 mmHg. Aortic valve peak gradient measures 8.0 mmHg. Aortic valve area, by VTI measures 3.33 cm. Pulmonic Valve: The pulmonic valve was not well visualized. Pulmonic valve regurgitation is trivial. No evidence of pulmonic stenosis. Aorta: The aortic root and ascending aorta are structurally normal, with no evidence of dilitation. Venous: The inferior vena cava is dilated in size with less than 50% respiratory variability, suggesting right atrial pressure of 15 mmHg. IAS/Shunts: No atrial level shunt detected by color flow Doppler. EKG: Rhythm strip during this exam demonstrates atrial fibrillation.  LEFT VENTRICLE PLAX 2D LVIDd:         4.20 cm      Diastology LVIDs:           2.90 cm      LV e' medial:    9.73 cm/s LV PW:         1.00 cm      LV E/e' medial:  15.4 LV IVS:        0.60 cm      LV e' lateral:   8.61 cm/s LVOT diam:     2.10 cm      LV E/e' lateral: 17.5 LV SV:         95 LV SV Index:   45 LVOT Area:     3.46 cm  LV Volumes (MOD) LV vol d, MOD A2C: 134.0 ml LV vol d, MOD A4C: 106.0 ml LV vol s, MOD A2C: 43.0 ml LV vol s, MOD A4C: 35.5 ml LV SV MOD A2C:     91.0 ml LV SV MOD A4C:     106.0 ml LV SV MOD BP:      82.9 ml RIGHT VENTRICLE             IVC RV S prime:     12.10 cm/s  IVC diam: 2.60 cm TAPSE (M-mode): 1.3 cm LEFT ATRIUM             Index        RIGHT ATRIUM           Index LA diam:        4.20 cm 1.99 cm/m   RA Area:     21.80 cm LA Vol (A2C):   52.4 ml 24.83  ml/m  RA Volume:   67.40 ml  31.94 ml/m LA Vol (A4C):   53.2 ml 25.21 ml/m LA Biplane Vol: 54.3 ml 25.73 ml/m  AORTIC VALVE AV Area (Vmax):    3.24 cm AV Area (Vmean):   3.09 cm AV Area (VTI):     3.33 cm AV Vmax:           141.50 cm/s AV Vmean:          94.500 cm/s AV VTI:            0.286 m AV Peak Grad:      8.0 mmHg AV Mean Grad:      4.2 mmHg LVOT Vmax:         132.25 cm/s LVOT Vmean:        84.250 cm/s LVOT VTI:          0.275 m LVOT/AV VTI ratio: 0.96  AORTA Ao Root diam: 3.40 cm Ao Asc diam:  3.30 cm MITRAL VALVE                TRICUSPID VALVE MV Area (PHT): 3.30 cm     TR Peak grad:   38.4 mmHg MV Area VTI:   2.57 cm     TR Vmax:        310.00 cm/s MV Peak grad:  8.2 mmHg MV Mean grad:  2.7 mmHg     SHUNTS MV Vmax:       1.43 m/s     Systemic VTI:  0.28 m MV Vmean:      74.5 cm/s    Systemic Diam: 2.10 cm MV Decel Time: 230 msec MV E velocity: 150.33 cm/s MV A velocity: 73.33 cm/s MV E/A ratio:  2.05 Sunit Tolia DO Electronically signed by Sunit Tolia DO Signature Date/Time: 07/07/2022/9:52:12 AM    Final    US Abdomen Complete  Result Date: 07/06/2022 CLINICAL DATA:  Cirrhosis. EXAM: ABDOMEN ULTRASOUND COMPLETE COMPARISON:    PET CT 02/13/2022, MRCP 12/16/2021 FINDINGS: Gallbladder: Partially distended. Wall thickening of 6 mm. No gallstones. No sonographic Murphy sign noted by sonographer. Common bile duct: Diameter: Previous common bile duct dilatation is not seen on the current exam, visualized common bile duct measures 4 mm. Pneumobilia on prior PET is not seen by ultrasound. Liver: Heterogeneous hepatic parenchyma, mildly increased. Nodular patter contours. There is no discrete hepatic lesion, although portions of the liver are suboptimally assessed. No flow is demonstrated in the main portal vein. IVC: No abnormality visualized. Pancreas: Visualized portion unremarkable. Spleen: Size and appearance within normal limits. No splenomegaly, greatest splenic length of 10.3 cm. Right Kidney:  Length: 8.5 cm. Increased parenchymal echogenicity with renal parenchymal thinning. No hydronephrosis. No evidence of focal lesion. Left Kidney: Length: 8.8 cm. Increased parenchymal echogenicity with renal parenchymal thinning. No hydronephrosis. Renal sinus cysts on prior PET are not well seen. No evidence of stone or focal lesion. Abdominal aorta: No aneurysm visualized. Other findings: No definite abdominal ascites. IMPRESSION: 1. Technically limited exam. Cirrhotic hepatic morphology, no evidence of focal liver lesion. 2. No flow is demonstrated in the main portal vein. It is unclear if this is due to technical factors or true portal vein occlusion. Recommend contrast-enhanced CT for further assessment. 3. Increased renal parenchymal echogenicity with parenchymal thinning typical of chronic medical renal disease. No hydronephrosis. Electronically Signed   By: Melanie  Sanford M.D.   On: 07/06/2022 17:32    Lab Results: Recent Labs    07/06/22 0934 07/06/22 2100 07/07/22 0304 07/07/22 1733 07/08/22 0818  WBC 12.3* 10.8* 8.3  --   --   HGB 5.0* 7.0* 6.5* 8.7* 8.9*  HCT 16.4* 22.1* 20.1* 26.9* 27.5*  PLT 106* 93* 102*  --   --    BMET Recent Labs    07/06/22 2100 07/07/22 0304 07/08/22 0237  NA 139 138 137  K 3.4* 3.3* 4.1  CL 108 108 104  CO2 20* 22 22  GLUCOSE 135* 108* 104*  BUN 28* 29* 29*  CREATININE 1.74* 1.70* 1.58*  CALCIUM 8.6* 8.2* 8.6*   LFT Recent Labs    07/07/22 0304 07/08/22 0237  PROT 5.1*  --   ALBUMIN 2.3* 2.5*  AST 23  --   ALT 13  --   ALKPHOS 51  --   BILITOT 1.1  --    PT/INR Recent Labs    07/06/22 2100 07/08/22 0237  LABPROT 17.6* 15.7*  INR 1.4* 1.2     Scheduled Meds:  bumetanide (BUMEX) IV  1 mg Intravenous Q12H   levothyroxine  112 mcg Oral QAC breakfast   sodium chloride flush  3 mL Intravenous Q12H   Continuous Infusions:  pantoprazole 8 mg/hr (07/08/22 0343)      Patient profile:   Carl Woods is a 89 y.o. male with  past medical history significant for past medical history as listed below including A-fib on Eliquis, Barrett's esophagus, diverticulosis, GERD presents for symptomatic anemia.   Patient has a history of cancer of ampulla of Vater diagnosed 12/2021 due to abnormal scan and elevated LFTs.  Follows with Dr. Feng. See GI workup below.  Patient is not a candidate for Whipple surgery.  Is undergoing radiation without chemotherapy (last radiation dose 03/2022). Scheduled for repeat ERCP 08/20/22   Presented with melena x 3 months (on iron), shortness of breath, and chest pain.  Hgb upon admission 5.0   Impression:   Symptomatic anemia Hgb 8.9, HCT 27.5 BUN 29, creatinine 1.58, GFR   42 PT 15.7, INR 1.2, improved Iron 216, TIBC 346, saturation 63% Ferritin 172 Fecal occult negative   Nodular liver January 2024/Cirrhosis (?) normal LFTs platelets 102 previous known fatty liver Ceruloplasmin 25.2 Negative HCV, Hep A, Hep B. Not immune to Hep B, will need immunizations Alpha 1 anti-trypsin 206 ANA negative ASMA pending MELD 3.0: 16 CT ab/pelvis w contrast: cirrhosis. No obvious portal venous thrombosis. Gallbladder wall edema secondary to liver disease. Bile duct stent with pneumobilia, portal hypertensive colopathy, large bilateral pleural effusions.  A-fib on Eliquis -Last dose 6/9 AM   Cancer of the ampulla of Vader   Plan:   - Patient has been cleared from cardiology for further GI workup. However, CT shows large bilateral pleural effusions. Negative for portal vein thrombosis - continue to diurese one more day - will put on schedule for tomorrow and get repeat CXR early AM prior to procedure - Continue daily CBC and transfuse as needed to maintain HGB > 7   - Continue to hold anticoagulation  Carl Woods  07/08/2022, 12:20 PM    

## 2022-07-08 NOTE — Consult Note (Signed)
Palliative Care Consult Note                                  Date: 07/08/2022   Patient Name: Carl Woods  DOB: 21-Apr-1931  MRN: [id number]  Age / Sex: 89 y.o., male  PCP: [name], MD Referring Physician: Barnetta Chapel, MD  Reason for Consultation: {Reason for Consult:23484}  HPI/Patient Profile: 89 y.o. male  with past medical history of *** admitted on 07/06/2022 with ***.   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   Left Forehead (treatment after biopsy)   OSA on CPAP    Perianal rash    Recurrent epistaxis    Renal cyst 01/04/2013   Small left peripelvic renal cysts , noted on US Renal   SCCA (squamous cell carcinoma) of skin 10/17/2015   Left Sup Bridge of Nose (curet, cautery and 5FU)   Seasonal allergies    Superficial basal cell carcinoma (BCC) 10/17/2015   Left Bulb of Nose (curet, cautery and 5FU)   Tubular adenoma     Subjective:   I have reviewed medical records including EPIC notes, labs and imaging, received report from the team, and assessed the patient at bedside.   I met with *** to discuss diagnosis, prognosis, GOC, EOL wishes, disposition, and options.  I introduced Palliative Medicine as specialized medical care for people living with serious illness. It focuses on providing relief from the symptoms and stress of a serious illness.   We  discussed patient's current illness and what it means in the larger context of his/her ongoing co-morbidities. Current clinical status was reviewed. Natural disease trajectory of *** was discussed.  Created space and opportunity for patient and family to explore thoughts and feelings regarding current medical situation.  Values and goals of care important to patient and family were attempted to be elicited.  A discussion was had today regarding advanced directives. Concepts specific to code status, artifical feeding and hydration, continued IV antibiotics and rehospitalization was had.  The MOST form was introduced and discussed.  Questions and concerns addressed. Patient/family encouraged to call with questions or concerns.    Life Review: ***  Functional Status: ***  Patient/Family Understanding of Illness: ***  Advanced Directives: HCPOA document on file designating Yostin Kraus, Vilinda Blanks., and Allayne Butcher as healthcare agents (in the order named). Living will document grants discretion to healthcare agent to make decisions regarding life prolonging measures in the following situations: He becomes unconscious and to a high degree of medical certainty he will never regain consciousness He suffers from advanced dementia or any other condition resulting in the substantial loss of cognitive ability and that is not reversible He has an incurable or irreversible condition that will result in death  within a relatively short period of time  Goals: ***  Additional Discussion: ***  Review of Systems  Objective:   Primary Diagnoses: Present on Admission:  Atrial fibrillation (HCC)  Cancer of ampulla of Vater (HCC)  Iron deficiency anemia due to chronic blood loss  Symptomatic anemia   Physical Exam  Vital Signs:  BP (!) 122/55 (BP Location: Right Arm)   Pulse 84   Temp 97.6 F (36.4 C) (Oral)   Resp (!) 21   Ht 5\' 8"  (1.727 m)   Wt 97.8 kg   SpO2 96%   BMI 32.78  kg/m   Palliative Assessment/Data: ***     Assessment & Plan:   SUMMARY OF RECOMMENDATIONS   ***  Primary Decision Maker: {Primary Decision JJOAC:16606}  Code Status/Advance Care Planning: {Palliative Code status:23503}  Symptom Management:  ***  Prognosis:  {Palliative Care Prognosis:23504}  Discharge Planning:  {Palliative dispostion:23505}   Discussed with: ***    Thank you for allowing [place] to participate in the care of [name]   Time Total: ***  Greater than 50%  of this time was spent counseling and coordinating care related to the above assessment and plan.  Signed by: Sherlean Foot, NP Palliative Medicine Team  Team Phone # [phone]  For individual providers, please see AMION

## 2022-07-08 NOTE — Anesthesia Preprocedure Evaluation (Addendum)
Anesthesia Evaluation  Patient identified by MRN, date of birth, ID band Patient awake    Reviewed: Allergy & Precautions, NPO status , Patient's Chart, lab work & pertinent test results, reviewed documented beta blocker date and time   Airway Mallampati: II  TM Distance: >3 FB Neck ROM: Limited    Dental no notable dental hx. (+) Caps, Dental Advisory Given   Pulmonary shortness of breath and with exertion, sleep apnea and Continuous Positive Airway Pressure Ventilation , Patient abstained from smoking., former smoker Pleural effusion   Pulmonary exam normal breath sounds clear to auscultation       Cardiovascular + Past MI  Normal cardiovascular exam+ dysrhythmias Atrial Fibrillation  Rhythm:Irregular Rate:Normal  NSTEMI on admission likely due to severe anemia  EKG 06/1022 Atrial fibrillation, low voltage, non specific ST-T wave changes  Echo 07/07/22 1. Left ventricular ejection fraction, by estimation, is 60 to 65%. The  left ventricle has normal function. The left ventricle has no regional  wall motion abnormalities. Left ventricular diastolic function could not  be evaluated. Elevated left atrial  pressure. The E/e' is 16.5.   2. Right ventricular systolic function is low normal. The right  ventricular size is mildly enlarged. There is moderately elevated  pulmonary artery systolic pressure. The estimated right ventricular  systolic pressure is 53.4 mmHg.   3. Right atrial size was dilated.   4. A small pericardial effusion is present. There is no evidence of  cardiac tamponade.   5. The mitral valve is degenerative. Trivial mitral valve regurgitation.  No evidence of mitral stenosis.   6. The aortic valve is grossly normal. Aortic valve regurgitation is not  visualized. Aortic valve sclerosis is present, with no evidence of aortic  valve stenosis.   7. The inferior vena cava is dilated in size with <50% respiratory   variability, suggesting right atrial pressure of 15 mmHg.   8. Rhythm strip during this exam demonstrates atrial fibrillation.      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 Hx/o diverticulosis Hx/o anal fistula   Endo/Other  Hypothyroidism  Obesity  Renal/GU Renal InsufficiencyRenal diseaseRenal cyst   BPH    Musculoskeletal  (+) Arthritis , Osteoarthritis,    Abdominal  (+) + obese  Peds  Hematology  (+) Blood dyscrasia, anemia Chronic anticoagulation Thrombocytopenia   Anesthesia Other Findings   Reproductive/Obstetrics                              Anesthesia Physical Anesthesia Plan  ASA: 4  Anesthesia Plan: MAC   Post-op Pain Management:    Induction: Intravenous  PONV Risk Score and Plan: 1 and Propofol infusion and Treatment may vary due to age or medical condition  Airway Management Planned: Natural Airway and Nasal Cannula  Additional Equipment: None  Intra-op Plan:   Post-operative Plan:   Informed Consent: I have reviewed the patients History and Physical, chart, labs and discussed the procedure including the risks, benefits and alternatives for the proposed anesthesia with the patient or authorized representative who has indicated his/her understanding and acceptance.     Dental advisory given  Plan Discussed with: Anesthesiologist and CRNA  Anesthesia Plan Comments:          Anesthesia Quick Evaluation

## 2022-07-08 NOTE — Progress Notes (Signed)
   07/07/22 2350  BiPAP/CPAP/SIPAP  BiPAP/CPAP/SIPAP Pt Type Adult  BiPAP/CPAP/SIPAP Resmed (@bedside )  Reason BIPAP/CPAP not in use Other(comment) (Unsure of settings "?4"; Will try & confirm in AM & pt will try tomorrow night)  BiPAP/CPAP /SiPAP Vitals  Bilateral Breath Sounds Clear  MEWS Score/Color  MEWS Score 1  MEWS Score Color Ch[place] Si

## 2022-07-09 ENCOUNTER — Encounter (HOSPITAL_COMMUNITY): Payer: Self-pay | Admitting: Internal Medicine

## 2022-07-09 ENCOUNTER — Inpatient Hospital Stay (HOSPITAL_COMMUNITY): Payer: PPO

## 2022-07-09 ENCOUNTER — Inpatient Hospital Stay (HOSPITAL_COMMUNITY): Payer: PPO | Admitting: Registered Nurse

## 2022-07-09 ENCOUNTER — Encounter (HOSPITAL_COMMUNITY)
Admission: EM | Disposition: A | Discharge status: Skilled Nursing Facility | Payer: Self-pay | Source: Home / Self Care | Attending: Internal Medicine

## 2022-07-09 DIAGNOSIS — D62 Acute posthemorrhagic anemia: Secondary | ICD-10-CM

## 2022-07-09 DIAGNOSIS — K254 Chronic or unspecified gastric ulcer with hemorrhage: Secondary | ICD-10-CM

## 2022-07-09 DIAGNOSIS — C241 Malignant neoplasm of ampulla of Vater: Secondary | ICD-10-CM | POA: Diagnosis not present

## 2022-07-09 DIAGNOSIS — T183XXA Foreign body in small intestine, initial encounter: Secondary | ICD-10-CM

## 2022-07-09 DIAGNOSIS — K317 Polyp of stomach and duodenum: Secondary | ICD-10-CM | POA: Diagnosis not present

## 2022-07-09 DIAGNOSIS — K2970 Gastritis, unspecified, without bleeding: Secondary | ICD-10-CM

## 2022-07-09 DIAGNOSIS — K922 Gastrointestinal hemorrhage, unspecified: Secondary | ICD-10-CM | POA: Diagnosis not present

## 2022-07-09 DIAGNOSIS — K3189 Other diseases of stomach and duodenum: Secondary | ICD-10-CM

## 2022-07-09 DIAGNOSIS — D649 Anemia, unspecified: Secondary | ICD-10-CM | POA: Diagnosis not present

## 2022-07-09 DIAGNOSIS — I5031 Acute diastolic (congestive) heart failure: Secondary | ICD-10-CM | POA: Diagnosis not present

## 2022-07-09 DIAGNOSIS — K259 Gastric ulcer, unspecified as acute or chronic, without hemorrhage or perforation: Secondary | ICD-10-CM

## 2022-07-09 DIAGNOSIS — G4733 Obstructive sleep apnea (adult) (pediatric): Secondary | ICD-10-CM

## 2022-07-09 DIAGNOSIS — Z9989 Dependence on other enabling machines and devices: Secondary | ICD-10-CM

## 2022-07-09 HISTORY — PX: HEMOSTASIS CLIP PLACEMENT: SHX6857

## 2022-07-09 HISTORY — PX: BIOPSY: SHX5522

## 2022-07-09 HISTORY — PX: ESOPHAGOGASTRODUODENOSCOPY: SHX5428

## 2022-07-09 LAB — BASIC METABOLIC PANEL
Anion gap: 9 (ref 5–15)
BUN: 31 mg/dL — ABNORMAL HIGH (ref 8–23)
CO2: 25 mmol/L (ref 22–32)
Calcium: 8.3 mg/dL — ABNORMAL LOW (ref 8.9–10.3)
Chloride: 103 mmol/L (ref 98–111)
Creatinine, Ser: 1.57 mg/dL — ABNORMAL HIGH (ref 0.61–1.24)
GFR, Estimated: 42 mL/min — ABNORMAL LOW (ref >60)
Glucose, Bld: 106 mg/dL — ABNORMAL HIGH (ref 70–99)
Potassium: 4.1 mmol/L (ref 3.5–5.1)
Sodium: 137 mmol/L (ref 135–145)

## 2022-07-09 LAB — CBC
HCT: 26.1 % — ABNORMAL LOW (ref 39.0–52.0)
Hemoglobin: 8.6 g/dL — ABNORMAL LOW (ref 13.0–17.0)
MCH: 33.2 pg (ref 26.0–34.0)
MCHC: 33 g/dL (ref 30.0–36.0)
MCV: 100.8 fL — ABNORMAL HIGH (ref 80.0–100.0)
Platelets: UNDETERMINED 10*3/uL (ref 150–400)
RBC: 2.59 MIL/uL — ABNORMAL LOW (ref 4.22–5.81)
RDW: 26.5 % — ABNORMAL HIGH (ref 11.5–15.5)
WBC: 5 10*3/uL (ref 4.0–10.5)
nRBC: 0.4 % — ABNORMAL HIGH (ref 0.0–0.2)

## 2022-07-09 LAB — POCT I-STAT, CHEM 8
BUN: 36 mg/dL — ABNORMAL HIGH (ref 8–23)
Calcium, Ion: 0.83 mmol/L — CL (ref 1.15–1.40)
Chloride: 107 mmol/L (ref 98–111)
Creatinine, Ser: 1.6 mg/dL — ABNORMAL HIGH (ref 0.61–1.24)
Glucose, Bld: 99 mg/dL (ref 70–99)
HCT: 26 % — ABNORMAL LOW (ref 39.0–52.0)
Hemoglobin: 8.8 g/dL — ABNORMAL LOW (ref 13.0–17.0)
Potassium: 4.7 mmol/L (ref 3.5–5.1)
Sodium: 137 mmol/L (ref 135–145)
TCO2: 28 mmol/L (ref 22–32)

## 2022-07-09 LAB — PROTIME-INR
INR: 1 (ref 0.8–1.2)
Prothrombin Time: 13 seconds (ref 11.4–15.2)

## 2022-07-09 SURGERY — EGD (ESOPHAGOGASTRODUODENOSCOPY)
Anesthesia: Monitor Anesthesia Care

## 2022-07-09 MED ORDER — PHENYLEPHRINE HCL-NACL 20-0.9 MG/250ML-% IV SOLN
INTRAVENOUS | Status: DC | PRN
  Administered 2022-07-09: 40 ug/min via INTRAVENOUS

## 2022-07-09 MED ORDER — DAPAGLIFLOZIN PROPANEDIOL 10 MG PO TABS
10.0000 mg | ORAL_TABLET | Freq: Every day | ORAL | Status: DC
  Administered 2022-07-09 – 2022-07-11 (×3): 10 mg via ORAL
  Filled 2022-07-09 (×3): qty 1

## 2022-07-09 MED ORDER — PANTOPRAZOLE SODIUM 40 MG PO TBEC
40.0000 mg | DELAYED_RELEASE_TABLET | Freq: Two times a day (BID) | ORAL | Status: DC
  Administered 2022-07-09 – 2022-07-11 (×5): 40 mg via ORAL
  Filled 2022-07-09 (×5): qty 1

## 2022-07-09 MED ORDER — LIDOCAINE 2% (20 MG/ML) 5 ML SYRINGE
INTRAMUSCULAR | Status: DC | PRN
  Administered 2022-07-09: 100 mg via INTRAVENOUS

## 2022-07-09 MED ORDER — PROPOFOL 10 MG/ML IV BOLUS
INTRAVENOUS | Status: DC | PRN
  Administered 2022-07-09: 30 mg via INTRAVENOUS

## 2022-07-09 MED ORDER — PROPOFOL 500 MG/50ML IV EMUL
INTRAVENOUS | Status: DC | PRN
  Administered 2022-07-09: 75 ug/kg/min via INTRAVENOUS

## 2022-07-09 MED ORDER — GLYCOPYRROLATE PF 0.2 MG/ML IJ SOSY
PREFILLED_SYRINGE | INTRAMUSCULAR | Status: DC | PRN
  Administered 2022-07-09: .1 mg via INTRAVENOUS

## 2022-07-09 MED ORDER — SUCRALFATE 1 GM/10ML PO SUSP
1.0000 g | Freq: Three times a day (TID) | ORAL | Status: DC
  Administered 2022-07-09 – 2022-07-11 (×9): 1 g via ORAL
  Filled 2022-07-09 (×9): qty 10

## 2022-07-09 MED ORDER — PHENYLEPHRINE HCL (PRESSORS) 10 MG/ML IV SOLN
INTRAVENOUS | Status: DC | PRN
  Administered 2022-07-09: 160 ug via INTRAVENOUS

## 2022-07-09 MED ORDER — CALCIUM GLUCONATE 10 % IV SOLN: 1.0000 g | Freq: Once | INTRAVENOUS | Status: DC

## 2022-07-09 MED ORDER — BUMETANIDE 0.25 MG/ML IJ SOLN
1.0000 mg | Freq: Every day | INTRAMUSCULAR | Status: DC
  Administered 2022-07-10: 1 mg via INTRAVENOUS
  Filled 2022-07-09 (×2): qty 4

## 2022-07-09 MED ORDER — LACTATED RINGERS IV SOLN
INTRAVENOUS | Status: DC
  Administered 2022-07-09: 1000 mL via INTRAVENOUS

## 2022-07-09 MED ORDER — CALCIUM GLUCONATE-NACL 1-0.675 GM/50ML-% IV SOLN
1.0000 g | Freq: Once | INTRAVENOUS | Status: AC
  Administered 2022-07-09: 1000 mg via INTRAVENOUS
  Filled 2022-07-09: qty 50

## 2022-07-09 NOTE — Progress Notes (Signed)
Pt transported to endo via stretcher. Called centeral monitoring to make them aware. Will call family when pt returns from endo.

## 2022-07-09 NOTE — Transfer of Care (Signed)
Immediate Anesthesia Transfer of Care Note  Patient: MODESTO GANOE  Procedure(s) Performed: ESOPHAGOGASTRODUODENOSCOPY (EGD) HEMOSTASIS CLIP PLACEMENT BIOPSY  Patient Location: PACU  Anesthesia Type:MAC  Level of Consciousness: awake and patient cooperative  Airway & Oxygen Therapy: Patient Spontanous Breathing  Post-op Assessment: Report given to RN and Post -op Vital signs reviewed and stable  Post vital signs: Reviewed and stable  Last Vitals:  Vitals Value Taken Time  BP    Temp    Pulse 85 07/09/22 1029  Resp 20 07/09/22 1029  SpO2 93 % 07/09/22 1029  Vitals shown include unvalidated device data.  Last Pain:  Vitals:   07/09/22 0855  TempSrc: Temporal  PainSc: 0-No pain         Complications: There were no known notable events for this encounter.

## 2022-07-09 NOTE — Progress Notes (Signed)
Patient in endoscopy during morning rounds.   Carl Woods, [place], [facility]  Pager:  [phone] Office: [phone]

## 2022-07-09 NOTE — TOC Progression Note (Addendum)
Transition of Care [facility]) - Progression Note    Patient Details  Name: Carl Woods MRN: [id number] Date of Birth: 07-21-1931  [facility]) CM/SW Contact  [name], LCSWA Phone Number: 07/09/2022, 9:05 AM  Clinical Narrative:     CSW spoke with Junious Dresser at [facility] and started insurance authorization for SNF and PTAR. Patients insurance authorization currently pending. Patient has SNF bed at [facility]. CSW will continue to follow and assist with patients dc planning needs.  Update-4:08pm- Patients insurance request current PT note. CSW informed Windell Moulding with PT. Patients insurance auth pending. CSW will continue to follow.  Expected Discharge Plan: Home w Home Health Services Barriers to Discharge: Continued Medical Work up  Expected Discharge Plan and Services   Discharge Planning Services: CM Consult   Living arrangements for the past 2 months: Single Family Home                                       Social Determinants of Health (SDOH) Interventions SDOH Screenings   Food Insecurity: No Food Insecurity (07/08/2022)  Housing: Patient Declined (07/08/2022)  Transportation Needs: Patient Declined (07/08/2022)  Utilities: Patient Declined (07/08/2022)  Depression (PHQ2-9): Low Risk  (02/18/2022)  Tobacco Use: Medium Risk (07/09/2022)    Readmission Risk Interventions    07/07/2022    1:52 PM  Readmission Risk Prevention Plan  Transportation Screening Complete  PCP or Specialist Appt within 3-5 Days Complete  HRI or Home Care Consult Complete  Social Work Consult for Recovery Care Planning/Counseling Complete  Palliative Care Screening Not Applicable  Medication Review Oceanographer) Complete

## 2022-07-09 NOTE — Interval H&P Note (Signed)
History and Physical Interval Note:  No a/e o/n. Repeat CXR this AM largely unchanged. Will plan for EGD today for diagnostic and therapeutic intent.   07/09/2022 8:59 AM  Carl Woods  has presented today for surgery, with the diagnosis of symptomatic anemia, melena.  The various methods of treatment have been discussed with the patient and family. After consideration of risks, benefits and other options for treatment, the patient has consented to  Procedure(s): ESOPHAGOGASTRODUODENOSCOPY (EGD) (N/A) as a surgical intervention.  The patient's history has been reviewed, patient examined, no change in status, stable for surgery.  I have reviewed the patient's chart and labs.  Questions were answered to the patient's satisfaction.     Carl Woods

## 2022-07-09 NOTE — Progress Notes (Signed)
PT Cancellation Note  Patient Details Name: Carl Woods MRN: [id number] DOB: 1931-03-16   Cancelled Treatment:    Reason Eval/Treat Not Completed: Medical issues which prohibited therapy.  Pt was in procedure then had returned but having care.  Follow up as time and pt allow.   Ivar Drape 07/09/2022, 3:11 PM  Samul Dada, PT PhD Acute Rehab Dept. Number: [facility] [id number] and [facility] [phone]

## 2022-07-09 NOTE — Progress Notes (Signed)
Palliative Medicine Progress Note   Patient Name: Carl Woods       Date: 07/09/2022 DOB: 09/12/1931  Age: 89 y.o. MRN#: [id number] Attending Physician: Burnadette Pop, MD Primary Care Physician: [name], MD Admit Date: 07/06/2022  Reason for Consultation/Follow-up: {Reason for Consult:23484}  HPI/Patient Profile: 89 y.o. 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  Patient presented to the ED on 07/06/2022 with chest pain and shortness of breath.  He was found to have hemoglobin of 5.  Also found to have hypokalemia and elevated troponin (deemed as type II elevation). He is admitted with symptomatic anemia and plan is for EGD once cleared by cardiology.   Palliative Medicine has been consulted for goals of care.  Subjective: Patient is having EGD this morning.   I met with his daughter/[name] and 1 of (his 2) sons in the family waiting room. Family has been updated by the Gastroenterologist (Dr. Barron Alvine) on EGD findings. They express relief that procedure went well, that cause of bleeding seems to have been found, and that a clip was placed.   Family reports that have received the blank MOST form and "hard choices" book I left at bedside, and plan to review this with patient.    Objective:  Physical Exam Vitals reviewed.             Vital Signs: BP (!) 107/55 (BP Location: Right Arm)   Pulse 76   Temp 97.7 F (36.5 C) (Oral)   Resp 20   Ht 5\' 8"  (1.727 m)   Wt 99.8 kg   SpO2 96%   BMI 33.45 kg/m  SpO2: SpO2: 96 % O2 Device: O2 Device: Room Air O2 Flow Rate:    Intake/output summary:  Intake/Output Summary (Last 24  hours) at 07/09/2022 1239 Last data filed at 07/09/2022 1020 Gross per 24 hour  Intake 940.43 ml  Output 5775 ml  Net -4834.57 ml    LBM: Last BM Date : 07/08/22     Palliative Assessment/Data: ***     Palliative Medicine Assessment & Plan   Assessment: Principal Problem:   Symptomatic anemia Active Problems:   Iron deficiency anemia due to chronic blood loss   Long term (current) use  of anticoagulants   Atrial fibrillation (HCC)   Cancer of ampulla of Vater (HCC)   Non-ST elevation (NSTEMI) myocardial infarction (HCC)   Elevated brain natriuretic peptide (BNP) level   Pleural effusion   Hypokalemia   Hypothyroidism   GERD (gastroesophageal reflux disease)   Shortness of breath   Hypervolemia   AKI (acute kidney injury) (HCC)   Acute heart failure with preserved ejection fraction (HFpEF) (HCC)   Preprocedural cardiovascular examination   Gastritis and gastroduodenitis   Gastric ulcer without hemorrhage or perforation   ABLA (acute blood loss anemia)    Recommendations/Plan: ***  Goals of Care and Additional Recommendations: Limitations on Scope of Treatment: {Recommended Scope and Preferences:21019}  Code Status:   Prognosis:  {Palliative Care Prognosis:23504}  Discharge Planning: {Palliative dispostion:23505}  Care plan was discussed with ***  Thank you for allowing the Palliative Medicine Team to assist in the care of this patient.   ***   Merry Proud, NP   Please contact Palliative Medicine Team phone at [phone] for questions and concerns.  For individual providers, please see AMION.

## 2022-07-09 NOTE — Anesthesia Postprocedure Evaluation (Signed)
Anesthesia Post Note  Patient: Carl Woods  Procedure(s) Performed: ESOPHAGOGASTRODUODENOSCOPY (EGD) HEMOSTASIS CLIP PLACEMENT BIOPSY     Patient location during evaluation: PACU Anesthesia Type: MAC Level of consciousness: awake and alert Pain management: pain level controlled Vital Signs Assessment: post-procedure vital signs reviewed and stable Respiratory status: spontaneous breathing, nonlabored ventilation and respiratory function stable Cardiovascular status: stable and blood pressure returned to baseline Postop Assessment: no apparent nausea or vomiting Anesthetic complications: no   There were no known notable events for this encounter.  Last Vitals:  Vitals:   07/09/22 1050 07/09/22 1116  BP: (!) 114/56 (!) 107/55  Pulse: 81 76  Resp: (!) 23 20  Temp:  36.5 C  SpO2: 95% 96%    Last Pain:  Vitals:   07/09/22 1116  TempSrc: Oral  PainSc: 0-No pain                 Tanaia Hawkey A.

## 2022-07-09 NOTE — Progress Notes (Signed)
Pt returned from endo

## 2022-07-09 NOTE — Op Note (Signed)
[facility] Patient Name: Carl Woods Procedure Date : 07/09/2022 MRN: [id number] Attending MD: Doristine Locks , MD, [id number] Date of Birth: Mar 27, 1931 CSN: [id number] Age: 89 Admit Type: Inpatient Procedure:                Upper GI endoscopy Indications:              Acute post hemorrhagic anemia, acute blood loss                            anemia, ampullary cancer s/p XRT and CBD stent                            placement, nodular appearing liver on CT. Providers:                Doristine Locks, MD, Adolph Pollack, RN, Harrington Challenger, Technician Referring MD:              Medicines:                Monitored Anesthesia Care Complications:            No immediate complications. Estimated Blood Loss:     Estimated blood loss was minimal. Procedure:                Pre-Anesthesia Assessment:                           - Prior to the procedure, a History and Physical                            was performed, and patient medications and                            allergies were reviewed. The patient's tolerance of                            previous anesthesia was also reviewed. The risks                            and benefits of the procedure and the sedation                            options and risks were discussed with the patient.                            All questions were answered, and informed consent                            was obtained. Prior Anticoagulants: The patient has                            taken Eliquis (apixaban), last dose was 4 days  prior to procedure. ASA Grade Assessment: IV - A                            patient with severe systemic disease that is a                            constant threat to life. After reviewing the risks                            and benefits, the patient was deemed in                            satisfactory condition to undergo the procedure.                            After obtaining informed consent, the endoscope was                            passed under direct vision. Throughout the                            procedure, the patient's blood pressure, pulse, and                            oxygen saturations were monitored continuously. The                            [id number]-[id number] ([id number]) Olympus endoscope was introduced                            through the mouth, and advanced to the third part                            of duodenum. The upper GI endoscopy was                            accomplished without difficulty. The patient                            tolerated the procedure well. Scope In: Scope Out: Findings:      The examined esophagus was normal. Specifically, no esophageal varices       noted.      The Z-line was regular and was found 45 cm from the incisors.      A few small sessile polyps with no bleeding and no stigmata of recent       bleeding were found in the gastric fundus and in the gastric body.      Localized severely erythematous mucosa was found in the prepyloric       region of the stomach. Biopsies were taken with a cold forceps for       histology. Estimated blood loss was minimal.      One non-bleeding gastric ulcer with a visible vessel was found in the       prepyloric region of the stomach. The lesion was  3 mm in largest       dimension. For hemostasis, one hemostatic clip was successfully placed       (MR conditional). There was no bleeding at the end of the procedure.      Mildly erythematous mucosa without active bleeding was found in the       duodenal bulb.      A stent was in place in the ampulla. No bleeding around the stent and no       blood noted in the duodenal lumen.      The mucosa in the second portion of the duodenum and third portion of       the duodenum were normal. Impression:               - Normal esophagus.                           - Z-line regular, 45 cm from the incisors.                            - A few gastric polyps.                           - Erythematous mucosa in the prepyloric region of                            the stomach. Endoscopically, this appeared                            suspicious for radiation gastritis. Biopsied.                           - Non-bleeding gastric ulcer with a visible vessel.                            Clip (MR conditional) was placed.                           - Erythematous duodenopathy.                           - Duodenal foreign body.                           - Normal second portion of the duodenum and third                            portion of the duodenum. Recommendation:           - Return patient to hospital ward for ongoing care.                           - Advance diet as tolerated.                           - Await pathology results.                           -  Use Protonix (pantoprazole) 40 mg PO BID.                           - Use sucralfate suspension 1 gram PO QID for 4                            weeks.                           - Plan for ERCP with stent exchange next month as                            scheduled. Will evaluate for mucosal healing at                            that time.                           - Continue serial CBC checks with blood products as                            needed per protocol.                           - If no evidence of ongoing bleeding, can restart                            anticoagulation in the next 24-48 hours. Procedure Code(s):        --- Professional ---                           [id number], 59, Esophagogastroduodenoscopy, flexible,                            transoral; with control of bleeding, any method                           43239, Esophagogastroduodenoscopy, flexible,                            transoral; with biopsy, single or multiple Diagnosis Code(s):        --- Professional ---                           K31.7, Polyp of stomach and duodenum                            K31.89, Other diseases of stomach and duodenum                           K25.4, Chronic or unspecified gastric ulcer with                            hemorrhage  T18.3XXA, Foreign body in small intestine, initial                            encounter                           D62, Acute posthemorrhagic anemia CPT copyright 2022 [employer]. All rights reserved. The codes documented in this report are preliminary and upon coder review may  be revised to meet current compliance requirements. Doristine Locks, MD 07/09/2022 10:37:11 AM Number of Addenda: 0

## 2022-07-09 NOTE — Progress Notes (Signed)
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  On presentation, he was found to have hemoglobin of 5, elevated troponin.  Report of melena.  Given blood transfusion.  Cardiology, GI consulted.  S/P EGD today.Possible dc to SNF tomorrow  Assessment & Plan:  Principal Problem:   Symptomatic anemia Active Problems:   Iron deficiency anemia due to chronic blood loss   Long term (current) use of anticoagulants   Atrial fibrillation (HCC)   Cancer of ampulla of Vater (HCC)   Non-ST elevation (NSTEMI) myocardial infarction (HCC)   Elevated brain natriuretic peptide (BNP) level   Pleural effusion   Hypokalemia   Hypothyroidism   GERD (gastroesophageal reflux disease)   Shortness of breath   Hypervolemia   AKI (acute kidney injury) (HCC)   Acute heart failure with preserved ejection fraction (HFpEF) (HCC)   Preprocedural cardiovascular examination   Symptomatic anemia/GI bleed: Presented with hemoglobin of 5, melena.  Given total of 4 units of blood transfusion during this hospitalization.  Currently hemoglobin stable.  GI following and he underwent EGD today.  EGD showed Normal esophagus without any esophageal or gastric varices, significant inflammation in the prepyloric antrum with a single ulcer with visible vessel which was clipped.  Findings suggestive of radiation gastritis .GI recommended PPI, Carafate. No bleeding around ampulla and stent in good position.  Plan for repeat ERCP .  As per GI, Eliquis can be restarted tomorrow. Continue PPI  NSTEMI/elevated troponin, elevated BNP: Presented with chest pain, shortness of breath suspected to be from  symptomatic anemia.  Elevated troponin was mostly from demand ischemia from severe anemia.  Cardiology was following. Patient appeared volume overloaded on presentation with elevated BNP.  Given diuretics. Last echo done on 2023 showed EF of 55 to 60%. Currently on Bumex IV 1 mg twice daily, plan for changing to oral before discharge.  Appears euvolemic today  Hypokalemia: Continue to monitor and supplement as needed  Paroxysmal A-fib: Monitor on telemetry. Remains in A-fib with controlled rate.  On metoprolol for rate control.  Eliquis on hold  Hypothyroidism: Continue Synthyroid  Cancer of ampulla Vater: Status post radiation.  Plan for ERCP in July.  Follows with GI  CKD stage IIIb: Continue kidney function at baseline. stable ,continue to monitor.  Baseline creatinine around 1.9  Obesity: BMI of 31.1  Debility/deconditioning: Patient seen by PT/OT and recommended SNF on discharge.  TOC consulted          DVT prophylaxis:SCDs Start: 07/06/22 1239     Code Status: Full Code  Family Communication: None at bedside  Patient status:Inpatient  Patient is from :Home  Anticipated discharge to:SNF  Estimated DC date:1-2 days   Consultants: Cardiology,GI  Procedures:EGD  Antimicrobials:  Anti-infectives (From admission, onward)    None       Subjective: Patient seen and examined at bedside today.  Hemodynamically stable.  Heart rate well controlled,BP  stable. Denies any nausea,vomiting or abd pain.  Just came  from EGD.  Tolerating Diet.  Denies any new complaints  Objective: Vitals:   07/08/22 1907 07/09/22 0031 07/09/22 0443 07/09/22 0830  BP: 132/69 118/66 (!) 119/57 119/62  Pulse: 75 83 68 69  Resp: (!) 22 20 (!) 22 (!)  25  Temp:  98.4 F (36.9 C) 98.1 F (36.7 C) 97.9 F (36.6 C)  TempSrc:  Oral Oral Oral  SpO2: 98% 97% 96% 97%  Weight:   92.8 kg   Height:        Intake/Output Summary (Last 24 hours) at 07/09/2022 0857 Last data filed at  07/09/2022 0600 Gross per 24 hour  Intake 790.43 ml  Output 7775 ml  Net -6984.57 ml   Filed Weights   07/06/22 1031 07/07/22 0447 07/09/22 0443  Weight: 99.8 kg 97.8 kg 92.8 kg    Examination:  General exam: Overall comfortable, not in distress, pleasant elderly gentleman HEENT: PERRL Respiratory system:  no wheezes or crackles  Cardiovascular system: Irregularly irregular rhythm.  Gastrointestinal system: Abdomen is nondistended, soft and nontender. Central nervous system: Alert and oriented Extremities: No edema, no clubbing ,no cyanosis Skin: No rashes, no ulcers,no icterus     Data Reviewed: I have personally reviewed following labs and imaging studies  CBC: Recent Labs  Lab 07/06/22 0934 07/06/22 2100 07/07/22 0304 07/07/22 1733 07/08/22 0818  WBC 12.3* 10.8* 8.3  --   --   NEUTROABS  --  9.1*  --   --   --   HGB 5.0* 7.0* 6.5* 8.7* 8.9*  HCT 16.4* 22.1* 20.1* 26.9* 27.5*  MCV 125.2* 107.3* 107.5*  --   --   PLT 106* 93* 102*  --   --    Basic Metabolic Panel: Recent Labs  Lab 07/06/22 0934 07/06/22 2100 07/07/22 0304 07/08/22 0237  NA 137 139 138 137  K 3.4* 3.4* 3.3* 4.1  CL 111 108 108 104  CO2 18* 20* 22 22  GLUCOSE 133* 135* 108* 104*  BUN 28* 28* 29* 29*  CREATININE 1.50* 1.74* 1.70* 1.58*  CALCIUM 8.7* 8.6* 8.2* 8.6*  MG 1.5* 1.6*  --  1.7  PHOS  --   --   --  2.1*     No results found for this or any previous visit (from the past 240 hour(s)).   Radiology Studies: DG CHEST PORT 1 VIEW  Result Date: 07/09/2022 CLINICAL DATA:  Shortness of breath.  Pleural effusion. EXAM: PORTABLE CHEST 1 VIEW COMPARISON:  07/06/2022 FINDINGS: Mild cardiomegaly as seen previously. Bilateral pleural effusions persist with lower lobe atelectasis and or pneumonia. Clustered calcified granulomas in the left lung as seen previously. Compared to the study of 3 days ago, allowing for technical differences, findings are fairly similar. Upper lungs remain clear.  IMPRESSION: Little change since the study of 3 days ago. Bilateral pleural effusions with lower lobe atelectasis and or pneumonia. Electronically Signed   By: Paulina Fusi M.D.   On: 07/09/2022 08:13   CT ABDOMEN PELVIS W CONTRAST  Result Date: 07/07/2022 CLINICAL DATA:  Portal vein thrombosis and cirrhosis. Possible portal vein occlusion. EXAM: CT ABDOMEN AND PELVIS WITH CONTRAST TECHNIQUE: Multidetector CT imaging of the abdomen and pelvis was performed using the standard protocol following bolus administration of intravenous contrast. RADIATION DOSE REDUCTION: This exam was performed according to the departmental dose-optimization program which includes automated exposure control, adjustment of the mA and/or kV according to patient size and/or use of iterative reconstruction technique. CONTRAST:  60mL OMNIPAQUE IOHEXOL 350 MG/ML SOLN COMPARISON:  Abdominal ultrasound 07/06/2022. PET-CT 02/13/2022. CT abdomen and pelvis 12/06/2021. MRI abdomen 12/16/2021 FINDINGS: Lower chest: Large bilateral pleural effusions with basilar consolidation or atelectasis. Small right pericardial cyst is unchanged since prior study. Hepatobiliary: Hepatic cirrhosis with enlarged lateral segment left lobe of  liver and nodular contour to the liver. No focal lesions are identified. The contrast bolus is poor, limiting the evaluation, but there appears to be flow demonstrated in the portal veins without obvious thrombosis. Gallbladder wall edema is likely related to liver disease. There is a stent in the common bile duct with associated pneumobilia. Pancreas: Unremarkable. No pancreatic ductal dilatation or surrounding inflammatory changes. Spleen: Normal in size without focal abnormality. Adrenals/Urinary Tract: No adrenal gland nodules. Bilateral parapelvic cysts. No imaging follow-up is indicated. No hydronephrosis or hydroureter. Bladder is normal. Stomach/Bowel: Stomach, small bowel, and colon are not abnormally distended.  Contrast material throughout most of the colon. 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 Vascular/Lymphatic: Aortic atherosclerosis. No enlarged abdominal or pelvic lymph nodes. Reproductive: Prostate is unremarkable. Other: No free air or free fluid in the abdomen. Mild infiltration in the mesentery, pericolic gutters, and pelvis. This could be early ascites or inflammatory reaction. There is a small supraumbilical hernia containing fat with increased density in the fat suggesting fat necrosis. No change since prior study. Edema in the subcutaneous fat throughout the abdomen and pelvis. Musculoskeletal: Degenerative changes in the spine. Compression of the L1 vertebra, unchanged. Mild anterior subluxation of L4 on L5 is unchanged, likely degenerative. IMPRESSION: 1. Hepatic cirrhosis. No focal lesions. The contrast bolus is somewhat limited but no obvious portal venous thrombosis is demonstrated in the central portal veins appear to have flow. 2. Gallbladder wall edema is likely due to liver disease. Bile duct stent with pneumobilia. 3. 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 4. Diffuse edema demonstrated in the subcutaneous fat, mesentery, and intra peritoneum. 5. Aortic atherosclerosis. 6. Small supraumbilical hernia with fat herniation and fat necrosis. 7. Large bilateral pleural effusions with basilar atelectasis or consolidation. Electronically Signed   By: Burman Nieves M.D.   On: 07/07/2022 19:42   ECHOCARDIOGRAM COMPLETE  Result Date: 07/07/2022    ECHOCARDIOGRAM REPORT   Patient Name:   Carl Woods Date of Exam: 07/07/2022 Medical Rec #:  [id number]    Height:       68.0 in Accession #:    [id number]   Weight:       215.6 lb Date of Birth:  07/29/1931    BSA:          2.110 m Patient Age:    89 years     BP:           104/52 mmHg Patient  Gender: M            HR:           74 bpm. Exam Location:  Inpatient Procedure: 2D Echo, Cardiac Doppler, Color Doppler and Intracardiac            Opacification Agent Indications:    NSTEMI, Chest Pain  History:        Patient has prior history of Echocardiogram examinations, most                 recent 06/18/2021. Cancer, Arrythmias:Atrial Fibrillation,                 Signs/Symptoms:Shortness of Breath and Chest Pain; Risk                 Factors:Former Smoker and Sleep Apnea.  Sonographer:    Wallie Char Referring Phys: [id number] Tessa Lerner  Sonographer Comments: Technically difficult study due to  poor echo windows. Image acquisition challenging due to respiratory motion. IMPRESSIONS  1. Left ventricular ejection fraction, by estimation, is 60 to 65%. The left ventricle has normal function. The left ventricle has no regional wall motion abnormalities. Left ventricular diastolic function could not be evaluated. Elevated left atrial pressure. The E/e' is 16.5.  2. Right ventricular systolic function is low normal. The right ventricular size is mildly enlarged. There is moderately elevated pulmonary artery systolic pressure. The estimated right ventricular systolic pressure is 53.4 mmHg.  3. Right atrial size was dilated.  4. A small pericardial effusion is present. There is no evidence of cardiac tamponade.  5. The mitral valve is degenerative. Trivial mitral valve regurgitation. No evidence of mitral stenosis.  6. The aortic valve is grossly normal. Aortic valve regurgitation is not visualized. Aortic valve sclerosis is present, with no evidence of aortic valve stenosis.  7. The inferior vena cava is dilated in size with <50% respiratory variability, suggesting right atrial pressure of 15 mmHg.  8. Rhythm strip during this exam demonstrates atrial fibrillation. Comparison(s): Outside echo 06/18/2021: LVEF 55-60%,mild TR, RVSP . FINDINGS  Left Ventricle: Left ventricular ejection fraction, by estimation, is  60 to 65%. The left ventricle has normal function. The left ventricle has no regional wall motion abnormalities. Definity contrast agent was given IV to delineate the left ventricular  endocardial borders. The left ventricular internal cavity size was normal in size. There is no left ventricular hypertrophy. Left ventricular diastolic function could not be evaluated due to atrial fibrillation. Left ventricular diastolic function could  not be evaluated. Elevated left atrial pressure. The E/e' is 16.5. Right Ventricle: The right ventricular size is mildly enlarged. Right vetricular wall thickness was not well visualized. Right ventricular systolic function is low normal. There is moderately elevated pulmonary artery systolic pressure. The tricuspid regurgitant velocity is 3.10 m/s, and with an assumed right atrial pressure of 15 mmHg, the estimated right ventricular systolic pressure is 53.4 mmHg. Left Atrium: Left atrial size was normal in size. Right Atrium: Right atrial size was dilated. Pericardium: A small pericardial effusion is present. There is no evidence of cardiac tamponade. Mitral Valve: The mitral valve is degenerative in appearance. Trivial mitral valve regurgitation. No evidence of mitral valve stenosis. MV peak gradient, 8.2 mmHg. The mean mitral valve gradient is 2.7 mmHg. Tricuspid Valve: The tricuspid valve is not well visualized. Tricuspid valve regurgitation is mild . No evidence of tricuspid stenosis. Aortic Valve: The aortic valve is grossly normal. Aortic valve regurgitation is not visualized. Aortic valve sclerosis is present, with no evidence of aortic valve stenosis. Aortic valve mean gradient measures 4.2 mmHg. Aortic valve peak gradient measures 8.0 mmHg. Aortic valve area, by VTI measures 3.33 cm. Pulmonic Valve: The pulmonic valve was not well visualized. Pulmonic valve regurgitation is trivial. No evidence of pulmonic stenosis. Aorta: The aortic root and ascending aorta are  structurally normal, with no evidence of dilitation. Venous: The inferior vena cava is dilated in size with less than 50% respiratory variability, suggesting right atrial pressure of 15 mmHg. IAS/Shunts: No atrial level shunt detected by color flow Doppler. EKG: Rhythm strip during this exam demonstrates atrial fibrillation.  LEFT VENTRICLE PLAX 2D LVIDd:         4.20 cm      Diastology LVIDs:         2.90 cm      LV e' medial:    9.73 cm/s LV PW:  1.00 cm      LV E/e' medial:  15.4 LV IVS:        0.60 cm      LV e' lateral:   8.61 cm/s LVOT diam:     2.10 cm      LV E/e' lateral: 17.5 LV SV:         95 LV SV Index:   45 LVOT Area:     3.46 cm  LV Volumes (MOD) LV vol d, MOD A2C: 134.0 ml LV vol d, MOD A4C: 106.0 ml LV vol s, MOD A2C: 43.0 ml LV vol s, MOD A4C: 35.5 ml LV SV MOD A2C:     91.0 ml LV SV MOD A4C:     106.0 ml LV SV MOD BP:      82.9 ml RIGHT VENTRICLE             IVC RV S prime:     12.10 cm/s  IVC diam: 2.60 cm TAPSE (M-mode): 1.3 cm LEFT ATRIUM             Index        RIGHT ATRIUM           Index LA diam:        4.20 cm 1.99 cm/m   RA Area:     21.80 cm LA Vol (A2C):   52.4 ml 24.83 ml/m  RA Volume:   67.40 ml  31.94 ml/m LA Vol (A4C):   53.2 ml 25.21 ml/m LA Biplane Vol: 54.3 ml 25.73 ml/m  AORTIC VALVE AV Area (Vmax):    3.24 cm AV Area (Vmean):   3.09 cm AV Area (VTI):     3.33 cm AV Vmax:           141.50 cm/s AV Vmean:          94.500 cm/s AV VTI:            0.286 m AV Peak Grad:      8.0 mmHg AV Mean Grad:      4.2 mmHg LVOT Vmax:         132.25 cm/s LVOT Vmean:        84.250 cm/s LVOT VTI:          0.275 m LVOT/AV VTI ratio: 0.96  AORTA Ao Root diam: 3.40 cm Ao Asc diam:  3.30 cm MITRAL VALVE                TRICUSPID VALVE MV Area (PHT): 3.30 cm     TR Peak grad:   38.4 mmHg MV Area VTI:   2.57 cm     TR Vmax:        310.00 cm/s MV Peak grad:  8.2 mmHg MV Mean grad:  2.7 mmHg     SHUNTS MV Vmax:       1.43 m/s     Systemic VTI:  0.28 m MV Vmean:      74.5 cm/s    Systemic  Diam: 2.10 cm MV Decel Time: 230 msec MV E velocity: 150.33 cm/s MV A velocity: 73.33 cm/s MV E/A ratio:  2.05 Sunit Tolia DO Electronically signed by Tessa Lerner DO Signature Date/Time: 07/07/2022/9:52:12 AM    Final     Scheduled Meds:  [MAR Hold] bumetanide (BUMEX) IV  1 mg Intravenous Q12H   [MAR Hold] levothyroxine  112 mcg Oral QAC breakfast   [MAR Hold] sodium chloride flush  3 mL Intravenous Q12H   Continuous Infusions:  sodium chloride 20 mL/hr at 07/09/22 0600   pantoprazole 8 mg/hr (07/09/22 0600)     LOS: 3 days   Burnadette Pop, MD [facility] P6/12/2022, 8:57 AM

## 2022-07-09 NOTE — Progress Notes (Signed)
Progress Note  Patient Name: Carl Woods MRN: [id number] DOB: 1931/03/05 Date of Encounter: 07/09/2022  Attending physician: Burnadette Pop, MD Primary care provider: [name], MD Primary Cardiologist: Dr. [name]  Subjective: Carl Woods is a 89 y.o. Caucasian male who was seen and examined at bedside  No chest pain or shortness of breath at rest. Currently on room air. Status post endoscopy earlier today Case discussed and reviewed with his nurse.  Objective: Vital Signs in the last 24 hours: Temp:  [97.7 F (36.5 C)-98.4 F (36.9 C)] 97.7 F (36.5 C) (06/12 1709) Pulse Rate:  [68-100] 100 (06/12 1709) Resp:  [18-25] 18 (06/12 1709) BP: (99-132)/(44-69) 109/65 (06/12 1709) SpO2:  [92 %-98 %] 94 % (06/12 1709) Weight:  [92.8 kg-99.8 kg] 99.8 kg (06/12 0855)  Intake/Output:  Intake/Output Summary (Last 24 hours) at 07/09/2022 1735 Last data filed at 07/09/2022 1355 Gross per 24 hour  Intake 940.43 ml  Output 4975 ml  Net -4034.57 ml    Net IO Since Admission: -13,695.57 mL [07/09/22 1735]  Weights:     07/09/2022    8:55 AM 07/09/2022    4:43 AM 07/07/2022    4:47 AM  Last 3 Weights  Weight (lbs) 220 lb 204 lb 9.4 oz 215 lb 9.8 oz  Weight (kg) 99.791 kg 92.8 kg 97.8 kg      Telemetry:  Overnight telemetry shows A-fib with controlled ventricular rate, which I personally reviewed.   Physical examination: PHYSICAL EXAM: Vitals:   07/09/22 1040 07/09/22 1050 07/09/22 1116 07/09/22 1709  BP: (!) 112/48 (!) 114/56 (!) 107/55 109/65  Pulse: 82 81 76 100  Resp: 20 (!) 23 20 18   Temp:   97.7 F (36.5 C) 97.7 F (36.5 C)  TempSrc:   Oral Oral  SpO2: 92% 95% 96% 94%  Weight:      Height:        Physical Exam  Constitutional: No distress. He appears chronically ill.  Older than stated age, hemodynamically stable, pale  HENT:  Poor dentition.  Neck: No JVD present.  Cardiovascular: Normal rate, regular rhythm, S1 normal, S2 normal, intact distal  pulses and normal pulses. Exam reveals no gallop, no S3 and no S4.  No murmur heard. Pulmonary/Chest: No stridor. He has no wheezes. He has bibasilar rales.  Decreased breath sounds   Abdominal: Soft. Bowel sounds are normal. He exhibits no distension. There is no abdominal tenderness.  Musculoskeletal:     Cervical back: Neck supple.     Comments: SCDs in place, trace bilateral lower extremity swelling  Neurological: He is alert and oriented to person, place, and time. He has intact cranial nerves (2-12).  Skin: Skin is warm and moist. Rash (Around the left knee.) noted.   Lab Results: Chemistry Recent Labs  Lab 07/06/22 2100 07/07/22 0304 07/08/22 0237 07/09/22 0126 07/09/22 0930  NA 139 138 137 137 137  K 3.4* 3.3* 4.1 4.1 4.7  CL 108 108 104 103 107  CO2 20* 22 22 25   --   GLUCOSE 135* 108* 104* 106* 99  BUN 28* 29* 29* 31* 36*  CREATININE 1.74* 1.70* 1.58* 1.57* 1.60*  CALCIUM 8.6* 8.2* 8.6* 8.3*  --   PROT 5.8* 5.1*  --   --   --   ALBUMIN 2.7* 2.3* 2.5*  --   --   AST 28 23  --   --   --   ALT 14 13  --   --   --  ALKPHOS 54 51  --   --   --   BILITOT 1.4* 1.1  --   --   --   GFRNONAA 37* 38* 42* 42*  --   ANIONGAP 11 8 11 9   --     Hematology Recent Labs  Lab 07/06/22 2100 07/07/22 0304 07/07/22 1733 07/08/22 0818 07/09/22 0126 07/09/22 0930  WBC 10.8* 8.3  --   --  5.0  --   RBC 2.06* 1.87*  --   --  2.59*  --   HGB 7.0* 6.5*   < > 8.9* 8.6* 8.8*  HCT 22.1* 20.1*   < > 27.5* 26.1* 26.0*  MCV 107.3* 107.5*  --   --  100.8*  --   MCH 34.0 34.8*  --   --  33.2  --   MCHC 31.7 32.3  --   --  33.0  --   RDW 27.9* 28.7*  --   --  26.5*  --   PLT 93* 102*  --   --  PLATELET CLUMPS NOTED ON SMEAR, UNABLE TO ESTIMATE  --    < > = values in this interval not displayed.   High Sensitivity Troponin:   Recent Labs  Lab 07/06/22 0934 07/06/22 1101 07/07/22 1733 07/07/22 2033  TROPONINIHS 138* 247* 859* 740*     Cardiac EnzymesNo results for input(s):  "TROPONINI" in the last 168 hours. No results for input(s): "TROPIPOC" in the last 168 hours.  BNP Recent Labs  Lab 07/06/22 0936  BNP 413.5*    DDimer No results for input(s): "DDIMER" in the last 168 hours.  Hemoglobin A1c: No results found for: "HGBA1C", "MPG" TSH No results for input(s): "TSH" in the last 8760 hours. Lipid Panel No results found for: "CHOL", "HDL", "LDLCALC", "LDLDIRECT", "TRIG", "CHOLHDL" Drugs of Abuse  No results found for: "LABOPIA", "COCAINSCRNUR", "LABBENZ", "AMPHETMU", "THCU", "LABBARB"    Imaging: DG CHEST PORT 1 VIEW  Result Date: 07/09/2022 CLINICAL DATA:  Shortness of breath.  Pleural effusion. EXAM: PORTABLE CHEST 1 VIEW COMPARISON:  07/06/2022 FINDINGS: Mild cardiomegaly as seen previously. Bilateral pleural effusions persist with lower lobe atelectasis and or pneumonia. Clustered calcified granulomas in the left lung as seen previously. Compared to the study of 3 days ago, allowing for technical differences, findings are fairly similar. Upper lungs remain clear. IMPRESSION: Little change since the study of 3 days ago. Bilateral pleural effusions with lower lobe atelectasis and or pneumonia. Electronically Signed   By: Paulina Fusi M.D.   On: 07/09/2022 08:13    CARDIAC DATABASE: EKG: July 06, 2022: Atrial fibrillation, 92 bpm, low voltage, consider lateral ischemia, rare PVCs.   Echocardiogram: 07/07/2022  1. Left ventricular ejection fraction, by estimation, is 60 to 65%. The left ventricle has normal function. The left ventricle has no regional wall motion abnormalities. Left ventricular diastolic function could not be evaluated. Elevated left atrial pressure. The E/e' is 16.5.   2. Right ventricular systolic function is low normal. The right ventricular size is mildly enlarged. There is moderately elevated pulmonary artery systolic pressure. The estimated right ventricular  systolic pressure is 53.4 mmHg.   3. Right atrial size was dilated.   4. A  small pericardial effusion is present. There is no evidence of cardiac tamponade.   5. The mitral valve is degenerative. Trivial mitral valve regurgitation. No evidence of mitral stenosis.   6. The aortic valve is grossly normal. Aortic valve regurgitation is not visualized. Aortic valve sclerosis is present, with no  evidence of aortic valve stenosis.   7. The inferior vena cava is dilated in size with <50% respiratory variability, suggesting right atrial pressure of 15 mmHg.   8. Rhythm strip during this exam demonstrates atrial fibrillation.   Comparison(s): Outside echo 06/18/2021: LVEF 55-60%,mild TR, RVSP .   Stress test: Lexiscan Tetrofosmin stress test 02/08/2020: Lexiscan nuclear stress test performed using 1-day protocol. Normal myocardial perfusion. Stress LVEF 76%. Low risk study.  Heart catheterization: None  Upper endoscopy July 09, 2022: Few gastric polyps. Erythematous mucosa in the prepyloric region of the stomach.  Suspicion for radiation gastritis. Nonbleeding gastric ulcer with visible vessel clip placed. See report for additional details  Scheduled Meds:  [START ON 07/10/2022] bumetanide (BUMEX) IV  1 mg Intravenous Daily   dapagliflozin propanediol  10 mg Oral Daily   levothyroxine  112 mcg Oral QAC breakfast   pantoprazole  40 mg Oral BID   sodium chloride flush  3 mL Intravenous Q12H   sucralfate  1 g Oral TID WC & HS    Continuous Infusions:    PRN Meds: acetaminophen **OR** acetaminophen   IMPRESSION & RECOMMENDATIONS: Carl Woods is a 89 y.o. Caucasian male whose past medical history and cardiac risk factors include: Permanent atrial fibrillation on anticoagulation, sleep apnea on CPAP, hypertension, emphysema, diabetes, Barrett's esophagus, diverticulosis, history of cancer of ampulla of Vater, advanced age.   Impression: Acute HFpEF NSTEMI likely secondary to supply demand ischemia/symptomatic anemia Preprocedure risk  stratification Symptomatic severe anemia (baseline hemoglobin 10, hemoglobin on arrival 5 g/dL) -likely secondary to gastric ulcer/radiation gastritis? Acute kidney injury. Permanent atrial fibrillation. 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 Hypokalemia  Recommendations: Acute HFpEF: Has diuresed well during this hospitalization.   Net IO Since Admission: -13,695.57 mL [07/09/22 1735] Will convert IV Bumex twice daily to Bumex daily Will start Farxiga 10 mg p.o. daily. Likely start Entresto tomorrow based on renal function Uptitrate GDMT as hemodynamics and laboratory values allow. Recommend consideration for thoracentesis given the size of bilateral pleural effusion.  This could be both therapeutic and diagnostic given his history of cancer.  Recommendations conveyed to attending physician. Strict I's and O's and daily weights Will recheck BMP in the morning  NSTEMI: Likely precipitated by symptomatic anemia with hemoglobin of 5 g/dL on arrival Has received several units of blood. No longer experiences chest pain. No additional workup warranted at this time.  Severe symptomatic anemia Baseline hemoglobin 10, hemoglobin on arrival 5. Has received several units of blood during this hospitalization. Endoscopy today noted concerns for gastric ulcer/radiation gastritis status post clipping  Past undergo ERCP in the upcoming weeks as originally planned  Acute kidney injury: Likely secondary to hypotension, intravascular depletion, volume overload, diuresis. Improving Continue BUN and creatinine.  Permanent atrial fibrillation: Long-term anticoagulation. Continue metoprolol at home dose. Not antiarrhythmics. CHA2DS2-VASc score 3. Given the severe symptomatic anemia with a hemoglobin of 5 g/dL on presentation recommend holding off anticoagulation for now patient and family understand the risk of thromboembolic events.  Cancer  of ampulla of Vater status post radiation: Followed by GI.  Patient's questions and concerns were addressed to his satisfaction. He voices understanding of the instructions provided during this encounter.   This note was created using a voice recognition software as a result there may be grammatical errors inadvertently enclosed that do not reflect the nature of this encounter. Every attempt is made to correct such errors.  Branndon Tuite Odis Hollingshead, DO, [facility]  Pager:  [phone] Office: [phone] 07/09/2022, 5:35 PM

## 2022-07-09 NOTE — Plan of Care (Signed)

## 2022-07-10 ENCOUNTER — Inpatient Hospital Stay (HOSPITAL_COMMUNITY): Payer: PPO

## 2022-07-10 ENCOUNTER — Other Ambulatory Visit: Payer: PPO

## 2022-07-10 DIAGNOSIS — N179 Acute kidney failure, unspecified: Secondary | ICD-10-CM

## 2022-07-10 DIAGNOSIS — D649 Anemia, unspecified: Secondary | ICD-10-CM | POA: Diagnosis not present

## 2022-07-10 DIAGNOSIS — I4891 Unspecified atrial fibrillation: Secondary | ICD-10-CM | POA: Diagnosis not present

## 2022-07-10 DIAGNOSIS — C241 Malignant neoplasm of ampulla of Vater: Secondary | ICD-10-CM | POA: Diagnosis not present

## 2022-07-10 DIAGNOSIS — K921 Melena: Secondary | ICD-10-CM | POA: Diagnosis not present

## 2022-07-10 HISTORY — PX: IR THORACENTESIS ASP PLEURAL SPACE W/IMG GUIDE: IMG5380

## 2022-07-10 LAB — BASIC METABOLIC PANEL
Anion gap: 7 (ref 5–15)
BUN: 25 mg/dL — ABNORMAL HIGH (ref 8–23)
CO2: 24 mmol/L (ref 22–32)
Calcium: 8.3 mg/dL — ABNORMAL LOW (ref 8.9–10.3)
Chloride: 106 mmol/L (ref 98–111)
Creatinine, Ser: 1.48 mg/dL — ABNORMAL HIGH (ref 0.61–1.24)
GFR, Estimated: 45 mL/min — ABNORMAL LOW (ref >60)
Glucose, Bld: 117 mg/dL — ABNORMAL HIGH (ref 70–99)
Potassium: 3.7 mmol/L (ref 3.5–5.1)
Sodium: 137 mmol/L (ref 135–145)

## 2022-07-10 LAB — CBC
HCT: 26.2 % — ABNORMAL LOW (ref 39.0–52.0)
Hemoglobin: 8.5 g/dL — ABNORMAL LOW (ref 13.0–17.0)
MCH: 32.8 pg (ref 26.0–34.0)
MCHC: 32.4 g/dL (ref 30.0–36.0)
MCV: 101.2 fL — ABNORMAL HIGH (ref 80.0–100.0)
Platelets: DECREASED 10*3/uL (ref 150–400)
RBC: 2.59 MIL/uL — ABNORMAL LOW (ref 4.22–5.81)
RDW: 26.2 % — ABNORMAL HIGH (ref 11.5–15.5)
WBC: 5.1 10*3/uL (ref 4.0–10.5)
nRBC: 0 % (ref 0.0–0.2)

## 2022-07-10 LAB — SURGICAL PATHOLOGY

## 2022-07-10 LAB — PROTIME-INR
INR: 1.1 (ref 0.8–1.2)
Prothrombin Time: 14.3 seconds (ref 11.4–15.2)

## 2022-07-10 LAB — BRAIN NATRIURETIC PEPTIDE: B Natriuretic Peptide: 358.5 pg/mL — ABNORMAL HIGH (ref 0.0–100.0)

## 2022-07-10 MED ORDER — APIXABAN 5 MG PO TABS
5.0000 mg | ORAL_TABLET | Freq: Two times a day (BID) | ORAL | Status: DC
  Administered 2022-07-11: 5 mg via ORAL
  Filled 2022-07-10: qty 1

## 2022-07-10 MED ORDER — LIDOCAINE HCL 1 % IJ SOLN
INTRAMUSCULAR | Status: AC
  Filled 2022-07-10: qty 20

## 2022-07-10 NOTE — Progress Notes (Addendum)
PROGRESS NOTE  Carl Woods  ZOX:[id number] DOB: Oct 04, 1931 DOA: 07/06/2022 PCP: [name], MD   Brief Narrative: Patient is 89 year old male with history of atrial fibrillation, anemia, and fistula, sacral angiodysplasia, AVM, diverticulosis, BPH, OSA, hypothyroidism, cancer of ampulla of vater  status post radiation who presented with chest pain, shortness of breath.  On presentation, he was found to have hemoglobin of 5, elevated troponin.  Report of melena.  Given blood transfusion.  Cardiology, GI consulted.  S/P EGD .Possible dc to SNF tomorrow  Assessment & Plan:  Principal Problem:   Symptomatic anemia Active Problems:   Iron deficiency anemia due to chronic blood loss   Long term (current) use of anticoagulants   Atrial fibrillation (HCC)   Cancer of ampulla of Vater (HCC)   Non-ST elevation (NSTEMI) myocardial infarction (HCC)   Elevated brain natriuretic peptide (BNP) level   Pleural effusion   Hypokalemia   Hypothyroidism   GERD (gastroesophageal reflux disease)   Shortness of breath   Hypervolemia   AKI (acute kidney injury) (HCC)   Acute heart failure with preserved ejection fraction (HFpEF) (HCC)   Preprocedural cardiovascular examination   Gastritis and gastroduodenitis   Gastric ulcer without hemorrhage or perforation   ABLA (acute blood loss anemia)   Symptomatic anemia/GI bleed: Presented with hemoglobin of 5, melena.  Given total of 4 units of blood transfusion during this hospitalization.  Currently hemoglobin stable.  GI following and he underwent EGD today.  EGD showed Normal esophagus without any esophageal or gastric varices, significant inflammation in the prepyloric antrum with a single ulcer with visible vessel which was clipped.  Findings suggestive of radiation gastritis .GI recommended PPI, Carafate. No bleeding around ampulla and stent in good position.  Plan for repeat ERCP as outpatient .  As per GI, Eliquis will be restarted Continue  PPI  NSTEMI/elevated troponin, elevated BNP/diastolic CHF: Presented with chest pain, shortness of breath suspected to be from symptomatic anemia.  Elevated troponin was mostly from demand ischemia from severe anemia.  Cardiology was following. Patient appeared volume overloaded on presentation with elevated BNP.  Given diuretics. Last echo done on 2023 showed EF of 55 to 60%. Currently on Bumex IV 1 mg twice daily, plan for changing to oral before discharge.  Appears euvolemic today  Bilateral pleural effusion: Underwent ultrasound-guided thoracentesis with removal of 1.2 L of pleural fluid from left.  Remains on room air without any dyspnea  Liver cirrhosis: As seen on the CT.  GI recommended outpatient follow-up.  Recommended ultrasound every 3 months for hepatocellular carcinoma screening.  Paroxysmal A-fib: Monitor on telemetry. Remains in A-fib with controlled rate.  On metoprolol for rate control.  Eliquis on hold  Hypothyroidism: Continue Synthyroid  Cancer of ampulla Vater: Status post radiation.  Plan for ERCP in July.  Follows with GI  CKD stage IIIb: Continue kidney function at baseline. stable ,continue to monitor.  Baseline creatinine around 1.9  Obesity: BMI of 31.1  Debility/deconditioning: Patient seen by PT/OT and recommended SNF on discharge.  TOC consulted          DVT prophylaxis:SCDs Start: 07/06/22 1239     Code Status: Full Code  Family Communication: Discussed with son at bedside on 6/13  Patient status:Inpatient  Patient is from :Home  Anticipated discharge to:SNF  Estimated DC date:tomorrow   Consultants: Cardiology,GI  Procedures:EGD  Antimicrobials:  Anti-infectives (From admission, onward)    None       Subjective: Seen and examined at bedside today.  Hemodynamically stable on room air.  Denies any worsening shortness of breath or cough.  Had a bowel movement last night which appeared black as per the patient.  Denies abdominal  pain  Objective: Vitals:   07/09/22 1709 07/09/22 1822 07/09/22 1920 07/10/22 0320  BP: 109/65 118/75 (!) 117/54 114/71  Pulse: 100 69 78 82  Resp: 18 20 17 20   Temp: 97.7 F (36.5 C)  97.9 F (36.6 C) 98.2 F (36.8 C)  TempSrc: Oral  Oral Oral  SpO2: 94%  98% 95%  Weight:    92.5 kg  Height:        Intake/Output Summary (Last 24 hours) at 07/10/2022 1328 Last data filed at 07/10/2022 0457 Gross per 24 hour  Intake 245.45 ml  Output 2225 ml  Net -1979.55 ml   Filed Weights   07/09/22 0443 07/09/22 0855 07/10/22 0320  Weight: 92.8 kg 99.8 kg 92.5 kg    Examination:  General exam: Overall comfortable, not in distress,obese HEENT: PERRL Respiratory system:  no wheezes or crackles , diminished air sounds on the bases Cardiovascular system: S1 & S2 heard, RRR.  Gastrointestinal system: Abdomen is nondistended, soft and nontender. Central nervous system: Alert and oriented Extremities: No edema, no clubbing ,no cyanosis Skin: No rashes, no ulcers,no icterus     Data Reviewed: I have personally reviewed following labs and imaging studies  CBC: Recent Labs  Lab 07/06/22 0934 07/06/22 2100 07/07/22 0304 07/07/22 1733 07/08/22 0818 07/09/22 0126 07/09/22 0930 07/10/22 0139  WBC 12.3* 10.8* 8.3  --   --  5.0  --  5.1  NEUTROABS  --  9.1*  --   --   --   --   --   --   HGB 5.0* 7.0* 6.5* 8.7* 8.9* 8.6* 8.8* 8.5*  HCT 16.4* 22.1* 20.1* 26.9* 27.5* 26.1* 26.0* 26.2*  MCV 125.2* 107.3* 107.5*  --   --  100.8*  --  101.2*  PLT 106* 93* 102*  --   --  PLATELET CLUMPS NOTED ON SMEAR, UNABLE TO ESTIMATE  --  PLATELET CLUMPS NOTED ON SMEAR, COUNT APPEARS DECREASED   Basic Metabolic Panel: Recent Labs  Lab 07/06/22 0934 07/06/22 2100 07/07/22 0304 07/08/22 0237 07/09/22 0126 07/09/22 0930 07/10/22 0139  NA 137 139 138 137 137 137 137  K 3.4* 3.4* 3.3* 4.1 4.1 4.7 3.7  CL 111 108 108 104 103 107 106  CO2 18* 20* 22 22 25   --  24  GLUCOSE 133* 135* 108* 104* 106* 99  117*  BUN 28* 28* 29* 29* 31* 36* 25*  CREATININE 1.50* 1.74* 1.70* 1.58* 1.57* 1.60* 1.48*  CALCIUM 8.7* 8.6* 8.2* 8.6* 8.3*  --  8.3*  MG 1.5* 1.6*  --  1.7  --   --   --   PHOS  --   --   --  2.1*  --   --   --      No results found for this or any previous visit (from the past 240 hour(s)).   Radiology Studies: DG CHEST PORT 1 VIEW  Result Date: 07/09/2022 CLINICAL DATA:  Shortness of breath.  Pleural effusion. EXAM: PORTABLE CHEST 1 VIEW COMPARISON:  07/06/2022 FINDINGS: Mild cardiomegaly as seen previously. Bilateral pleural effusions persist with lower lobe atelectasis and or pneumonia. Clustered calcified granulomas in the left lung as seen previously. Compared to the study of 3 days ago, allowing for technical differences, findings are fairly similar. Upper lungs remain clear. IMPRESSION: Little change  since the study of 3 days ago. Bilateral pleural effusions with lower lobe atelectasis and or pneumonia. Electronically Signed   By: Paulina Fusi M.D.   On: 07/09/2022 08:13    Scheduled Meds:  bumetanide (BUMEX) IV  1 mg Intravenous Daily   dapagliflozin propanediol  10 mg Oral Daily   levothyroxine  112 mcg Oral QAC breakfast   pantoprazole  40 mg Oral BID   sodium chloride flush  3 mL Intravenous Q12H   sucralfate  1 g Oral TID WC & HS   Continuous Infusions:     LOS: 4 days   Burnadette Pop, MD [facility] P6/13/2024, 1:28 PM

## 2022-07-10 NOTE — TOC Progression Note (Addendum)
Transition of Care [facility]) - Progression Note    Patient Details  Name: Carl Woods MRN: [id number] Date of Birth: 10-27-1931  [facility] CM/SW Contact  [name], LCSWA Phone Number: 07/10/2022, 10:51 AM  Clinical Narrative:     Update- CSW received call from [place] with [facility] who informed CSW that patients insurance authorization has been approved for SNF and PTAR. SNF authorization approved for 7 days reference # [id number]. PTAR has been approved [id number]. Approved for 90 days. CSW will continue to follow.  Patients insurance authorization currently pending for SNF and PTAR.Patients insurance requested current PT note. CSW informed Windell Moulding with PT yesterday. PT plans to see patient today. Patient has SNF bed at [facility].CSW will continue to follow and assist with patients dc planning needs.  Expected Discharge Plan: Home w Home Health Services Barriers to Discharge: Continued Medical Work up  Expected Discharge Plan and Services   Discharge Planning Services: CM Consult   Living arrangements for the past 2 months: Single Family Home                                       Social Determinants of Health (SDOH) Interventions SDOH Screenings   Food Insecurity: No Food Insecurity (07/08/2022)  Housing: Patient Declined (07/08/2022)  Transportation Needs: Patient Declined (07/08/2022)  Utilities: Patient Declined (07/08/2022)  Depression (PHQ2-9): Low Risk  (02/18/2022)  Tobacco Use: Medium Risk (07/09/2022)    Readmission Risk Interventions    07/07/2022    1:52 PM  Readmission Risk Prevention Plan  Transportation Screening Complete  PCP or Specialist Appt within 3-5 Days Complete  HRI or Home Care Consult Complete  Social Work Consult for Recovery Care Planning/Counseling Complete  Palliative Care Screening Not Applicable  Medication Review [employer]) Complete

## 2022-07-10 NOTE — Progress Notes (Signed)
   07/10/22 2031  BiPAP/CPAP/SIPAP  Reason BIPAP/CPAP not in use Non-compliant

## 2022-07-10 NOTE — Progress Notes (Signed)
Physical Therapy Treatment Patient Details Name: Carl Woods MRN: [id number] DOB: 31-Jul-1931 Today's Date: 07/10/2022   History of Present Illness Patient is 89 y.o. male who presents with a chief complaint of chest pain and shortness of breath. Cardiology consulted and feels NSTEMI likely secondary to supply demand ischemia/symptomatic anemia. 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    PT Comments    Pt tolerated treatment well today. Pt was able to transfer to [facility] with RW Min A. Pt left on [facility] with nursing staff. No change in DC/DME recs at this time. Pt will continue to follow.   Recommendations for follow up therapy are one component of a multi-disciplinary discharge planning process, led by the attending physician.  Recommendations may be updated based on patient status, additional functional criteria and insurance authorization.  Follow Up Recommendations  Can patient physically be transported by private vehicle: No    Assistance Recommended at Discharge Frequent or constant Supervision/Assistance  Patient can return home with the following A little help with walking and/or transfers;A little help with bathing/dressing/bathroom;Assistance with cooking/housework;Direct supervision/assist for medications management;Assist for transportation;Help with stairs or ramp for entrance   Equipment Recommendations  None recommended by PT    Recommendations for Other Services       Precautions / Restrictions Precautions Precautions: Fall Restrictions Weight Bearing Restrictions: No     Mobility  Bed Mobility Overal bed mobility: Needs Assistance Bed Mobility: Supine to Sit     Supine to sit: Supervision, HOB elevated          Transfers Overall transfer level: Needs assistance Equipment used: Rolling walker (2  wheels) Transfers: Sit to/from Stand, Bed to chair/wheelchair/BSC Sit to Stand: Min assist   Step pivot transfers: Min assist       General transfer comment: strong min A to power STS, min A for balance during transfer. Cues for hand placement and proximity/safety with RW.    Ambulation/Gait               General Gait Details: Deferred   Stairs             Wheelchair Mobility    Modified Rankin (Stroke Patients Only)       Balance Overall balance assessment: Needs assistance, History of Falls Sitting-balance support: Feet supported Sitting balance-Leahy Scale: Good     Standing balance support: Bilateral upper extremity supported, During functional activity, Reliant on assistive device for balance Standing balance-Leahy Scale: Poor Standing balance comment: Reliant on RW                            Cognition Arousal/Alertness: Awake/alert Behavior During Therapy: WFL for tasks assessed/performed Overall Cognitive Status: Within Functional Limits for tasks assessed                                          Exercises      General Comments General comments (skin integrity, edema, etc.): VSS on RA      Pertinent Vitals/Pain Pain Assessment Pain Assessment: No/denies pain    Home Living                          Prior Function  PT Goals (current goals can now be found in the care plan section) Progress towards PT goals: Progressing toward goals    Frequency    Min 1X/week      PT Plan Current plan remains appropriate    Co-evaluation              AM-PAC PT "6 Clicks" Mobility   Outcome Measure  Help needed turning from your back to your side while in a flat bed without using bedrails?: A Little Help needed moving from lying on your back to sitting on the side of a flat bed without using bedrails?: A Little Help needed moving to and from a bed to a chair (including a wheelchair)?:  A Little Help needed standing up from a chair using your arms (e.g., wheelchair or bedside chair)?: A Little Help needed to walk in hospital room?: A Lot Help needed climbing 3-5 steps with a railing? : Total 6 Click Score: 15    End of Session Equipment Utilized During Treatment: Gait belt Activity Tolerance: Patient tolerated treatment well Patient left: Other (comment);with call bell/phone within reach;with nursing/sitter in room (Pt left on [facility] with nursing staff) Nurse Communication: Mobility status PT Visit Diagnosis: Muscle weakness (generalized) (M62.81);Difficulty in walking, not elsewhere classified (R26.2);Other abnormalities of gait and mobility (R26.89)     Time: 1610-9604 PT Time Calculation (min) (ACUTE ONLY): 14 min  Charges:  $Therapeutic Activity: 8-22 mins                     Shela Nevin, PT, DPT Acute Rehab Services [phone]    Gladys Damme 07/10/2022, 12:04 PM

## 2022-07-10 NOTE — Plan of Care (Signed)

## 2022-07-10 NOTE — Progress Notes (Addendum)
Attending physician's note   I have taken a history, reviewed the chart, and examined the patient. I performed a substantive portion of this encounter, including complete performance of at least one of the key components, in conjunction with the APP. I agree with the APP's note, impression, and recommendations with my edits.   H/H stable.  Inpatient GI service will sign off at this time.  Planning on ERCP next month as scheduled for stent exchange.  Will evaluate for appropriate mucosal healing at that time as well.  Otherwise to continue with high-dose Protonix and Carafate as outlined.  Planning to restart anticoagulation per d/w Hospitalist service.  Netanel Yannuzzi, DO, FACG [phone] office          Progress Note  Primary GI: Dr. Leone Payor  LOS: 4 days   Chief Complaint: Symptomatic anemia   Subjective   Patient with family at bedside, son and daughter-in-law. Provided some of the history.   Patient states he is feeling good today.  Tolerating breakfast without difficulty.  Denies further bowel movements.  Denies abdominal pain, nausea, vomiting.   Objective   Vital signs in last 24 hours: Temp:  [97.7 F (36.5 C)-98.2 F (36.8 C)] 98.2 F (36.8 C) (06/13 0320) Pulse Rate:  [69-100] 82 (06/13 0320) Resp:  [17-20] 20 (06/13 0320) BP: (107-118)/(54-75) 114/71 (06/13 0320) SpO2:  [94 %-98 %] 95 % (06/13 0320) Weight:  [92.5 kg] 92.5 kg (06/13 0320) Last BM Date : 07/09/22 Last BM recorded by nurses in past 5 days Stool Type: Type 6 (Mushy consistency with ragged edges) (07/09/2022 11:48 PM)  General:   male in no acute distress  Heart:  Regular rate and rhythm; no murmurs Pulm: Clear anteriorly; no wheezing Abdomen: soft, nondistended, normal bowel sounds in all quadrants. Nontender without guarding. No organomegaly appreciated. Extremities:  No edema Neurologic:  Alert and  oriented x4;  No focal deficits.  Psych:  Cooperative. Normal mood and  affect.  Intake/Output from previous day: 06/12 0701 - 06/13 0700 In: 395.5 [P.O.:200; I.V.:195.5] Out: 2225 [Urine:2225] Intake/Output this shift: No intake/output data recorded.  Studies/Results: DG CHEST PORT 1 VIEW  Result Date: 07/09/2022 CLINICAL DATA:  Shortness of breath.  Pleural effusion. EXAM: PORTABLE CHEST 1 VIEW COMPARISON:  07/06/2022 FINDINGS: Mild cardiomegaly as seen previously. Bilateral pleural effusions persist with lower lobe atelectasis and or pneumonia. Clustered calcified granulomas in the left lung as seen previously. Compared to the study of 3 days ago, allowing for technical differences, findings are fairly similar. Upper lungs remain clear. IMPRESSION: Little change since the study of 3 days ago. Bilateral pleural effusions with lower lobe atelectasis and or pneumonia. Electronically Signed   By: Paulina Fusi M.D.   On: 07/09/2022 08:13    Lab Results: Recent Labs    07/09/22 0126 07/09/22 0930 07/10/22 0139  WBC 5.0  --  5.1  HGB 8.6* 8.8* 8.5*  HCT 26.1* 26.0* 26.2*  PLT PLATELET CLUMPS NOTED ON SMEAR, UNABLE TO ESTIMATE  --  PLATELET CLUMPS NOTED ON SMEAR, COUNT APPEARS DECREASED   BMET Recent Labs    07/08/22 0237 07/09/22 0126 07/09/22 0930 07/10/22 0139  NA 137 137 137 137  K 4.1 4.1 4.7 3.7  CL 104 103 107 106  CO2 22 25  --  24  GLUCOSE 104* 106* 99 117*  BUN 29* 31* 36* 25*  CREATININE 1.58* 1.57* 1.60* 1.48*  CALCIUM 8.6* 8.3*  --  8.3*   LFT Recent Labs  07/08/22 0237  ALBUMIN 2.5*   PT/INR Recent Labs    07/09/22 0126 07/10/22 0139  LABPROT 13.0 14.3  INR 1.0 1.1     Scheduled Meds:  bumetanide (BUMEX) IV  1 mg Intravenous Daily   dapagliflozin propanediol  10 mg Oral Daily   levothyroxine  112 mcg Oral QAC breakfast   pantoprazole  40 mg Oral BID   sodium chloride flush  3 mL Intravenous Q12H   sucralfate  1 g Oral TID WC & HS   Continuous Infusions:    Patient profile:   Carl Woods is a 89 y.o. male  with past medical history significant for past medical history as listed below including A-fib on Eliquis, Barrett's esophagus, diverticulosis, GERD presents for symptomatic anemia.   Patient has a history of cancer of ampulla of Vater diagnosed 12/2021 due to abnormal scan and elevated LFTs.  Follows with Dr. Mosetta Putt. See GI workup below.  Patient is not a candidate for Whipple surgery.  Is undergoing radiation without chemotherapy (last radiation dose 03/2022). Scheduled for repeat ERCP 08/20/22   Presented with melena x 3 months (on iron), shortness of breath, and chest pain.  Hgb upon admission 5.0. found to have NSTEMI secondary to demand ischemia. Followed by cardiology.   Impression/Plan:   1) Symptomatic anemia Hgb 8.5, stable. MCV 101.2 Platelet clumps noted on smear, count appears decreased PT 15.7, INR 1.2, improved Iron 216, TIBC 346, saturation 63% Ferritin 172 Fecal occult negative EGD 07/09/2022: Normal esophagus, few gastric polyps, erythematous mucosa in the prepyloric region of stomach suspicion for radiation gastritis.  Nonbleeding gastric ulcer with visible vessel.  Clip placed.  Erythematous duodenopathy.  Duodenal foreign body.  Normal second portion and third portion of duodenum.  Biopsies pending  -Await pathology results -Continue Protonix 40 Mg p.o. twice daily -Carafate suspension 1 g p.o. 4 times daily for 4 weeks -ERCP with stent exchange next month (already scheduled).  Will evaluate for mucosal healing at that time -Continue daily CBC and transfuse as needed to maintain HGB > 7  -No further bleeding at this time.  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 Not immune to Hep B, will need immunizations Alpha 1 anti-trypsin 206 ANA negative ASMA 9 MELD 3.0:  16 CT ab/pelvis w contrast: cirrhosis. No obvious portal venous thrombosis. Gallbladder wall edema secondary to liver disease. Bile duct stent with pneumobilia, portal hypertensive colopathy, large bilateral pleural effusions.  -Will follow as an outpatient.  Will need ultrasound every 6 months for hepatocellular carcinoma screening   Acute HFpEF -Starting Entresto -Followed by cardiology  NSTEMI likely secondary to supply demand ischemia/symptomatic anemia  A-fib on Eliquis -Last dose 6/9 AM, can resume today or tomorrow since hgb stable and no further bleeding   Cancer of the ampulla of Vader  AKI -BUN 25, creatinine 1.48, GFR 45  Carl Woods  07/10/2022, 11:07 AM

## 2022-07-10 NOTE — Procedures (Addendum)
PROCEDURE SUMMARY:  Successful image-guided left thoracentesis. Yielded 1.2 L of hazy yellow fluid. Pt tolerated procedure well. No immediate complications. EBL = trace   Specimen was not sent for labs. CXR ordered.  Please see imaging section of Epic for full dictation.  Patient's SBP dropped to high 80s after removing 1.2 L of left pleural effusion, patient was asymptomatic, HR remained in 70s, O2 sat 95!97% in RA. He was placed in spine position for 10 mins, repeat SBP in 100s. Patient transported to diagnostic xray for post thora CXR in stable condition.   Lynann Bologna Strider Vallance PA-C 07/10/2022 12:30 PM

## 2022-07-10 NOTE — Progress Notes (Signed)
   07/09/22 2326  BiPAP/CPAP/SIPAP  BiPAP/CPAP/SIPAP Resmed  Reason BIPAP/CPAP not in use Non-compliant

## 2022-07-11 ENCOUNTER — Telehealth: Payer: Self-pay

## 2022-07-11 DIAGNOSIS — R1013 Epigastric pain: Secondary | ICD-10-CM | POA: Diagnosis not present

## 2022-07-11 DIAGNOSIS — I5031 Acute diastolic (congestive) heart failure: Secondary | ICD-10-CM | POA: Diagnosis not present

## 2022-07-11 DIAGNOSIS — I4821 Permanent atrial fibrillation: Secondary | ICD-10-CM | POA: Diagnosis not present

## 2022-07-11 DIAGNOSIS — R4182 Altered mental status, unspecified: Secondary | ICD-10-CM | POA: Diagnosis not present

## 2022-07-11 DIAGNOSIS — E039 Hypothyroidism, unspecified: Secondary | ICD-10-CM | POA: Diagnosis not present

## 2022-07-11 DIAGNOSIS — Z7401 Bed confinement status: Secondary | ICD-10-CM | POA: Diagnosis not present

## 2022-07-11 DIAGNOSIS — I469 Cardiac arrest, cause unspecified: Secondary | ICD-10-CM | POA: Diagnosis not present

## 2022-07-11 DIAGNOSIS — D649 Anemia, unspecified: Secondary | ICD-10-CM | POA: Diagnosis not present

## 2022-07-11 DIAGNOSIS — D519 Vitamin B12 deficiency anemia, unspecified: Secondary | ICD-10-CM | POA: Diagnosis not present

## 2022-07-11 DIAGNOSIS — I10 Essential (primary) hypertension: Secondary | ICD-10-CM | POA: Diagnosis not present

## 2022-07-11 DIAGNOSIS — R1084 Generalized abdominal pain: Secondary | ICD-10-CM | POA: Diagnosis not present

## 2022-07-11 DIAGNOSIS — C241 Malignant neoplasm of ampulla of Vater: Secondary | ICD-10-CM | POA: Diagnosis not present

## 2022-07-11 DIAGNOSIS — R2681 Unsteadiness on feet: Secondary | ICD-10-CM | POA: Diagnosis not present

## 2022-07-11 DIAGNOSIS — I4891 Unspecified atrial fibrillation: Secondary | ICD-10-CM | POA: Diagnosis not present

## 2022-07-11 DIAGNOSIS — N1831 Chronic kidney disease, stage 3a: Secondary | ICD-10-CM | POA: Diagnosis not present

## 2022-07-11 DIAGNOSIS — I2510 Atherosclerotic heart disease of native coronary artery without angina pectoris: Secondary | ICD-10-CM | POA: Diagnosis not present

## 2022-07-11 DIAGNOSIS — Z8719 Personal history of other diseases of the digestive system: Secondary | ICD-10-CM | POA: Diagnosis not present

## 2022-07-11 LAB — CBC
HCT: 27.9 % — ABNORMAL LOW (ref 39.0–52.0)
Hemoglobin: 8.9 g/dL — ABNORMAL LOW (ref 13.0–17.0)
MCH: 32.4 pg (ref 26.0–34.0)
MCHC: 31.9 g/dL (ref 30.0–36.0)
MCV: 101.5 fL — ABNORMAL HIGH (ref 80.0–100.0)
Platelets: UNDETERMINED 10*3/uL (ref 150–400)
RBC: 2.75 MIL/uL — ABNORMAL LOW (ref 4.22–5.81)
RDW: 25.6 % — ABNORMAL HIGH (ref 11.5–15.5)
WBC: 8.4 10*3/uL (ref 4.0–10.5)
nRBC: 0 % (ref 0.0–0.2)

## 2022-07-11 MED ORDER — PANTOPRAZOLE SODIUM 40 MG PO TBEC: 40.0000 mg | DELAYED_RELEASE_TABLET | Freq: Two times a day (BID) | ORAL | Status: AC

## 2022-07-11 MED ORDER — SUCRALFATE 1 GM/10ML PO SUSP: 1.0000 g | Freq: Three times a day (TID) | ORAL | 0 refills | Status: DC

## 2022-07-11 MED ORDER — APIXABAN 2.5 MG PO TABS: 2.5000 mg | ORAL_TABLET | Freq: Two times a day (BID) | ORAL | Status: DC

## 2022-07-11 MED ORDER — BUMETANIDE 1 MG PO TABS: 1.0000 mg | ORAL_TABLET | Freq: Every day | ORAL | Status: DC

## 2022-07-11 MED ORDER — METOPROLOL SUCCINATE ER 25 MG PO TB24
12.5000 mg | ORAL_TABLET | Freq: Every day | ORAL | 0 refills | Status: DC
Start: 2022-07-11 — End: 2022-08-08

## 2022-07-11 MED ORDER — BUMETANIDE 1 MG PO TABS
1.0000 mg | ORAL_TABLET | Freq: Every day | ORAL | Status: DC
  Administered 2022-07-11: 1 mg via ORAL
  Filled 2022-07-11: qty 1

## 2022-07-11 NOTE — Plan of Care (Signed)

## 2022-07-11 NOTE — Plan of Care (Signed)
Problem: Education: Goal: Knowledge of General Education information will improve Description: Including pain rating scale, medication(s)/side effects and non-pharmacologic comfort measures 07/11/2022 1138 by Herma Carson, RN Outcome: Adequate for Discharge 07/11/2022 1018 by Herma Carson, RN Outcome: Progressing 07/11/2022 0816 by Herma Carson, RN Outcome: Progressing   Problem: Health Behavior/Discharge Planning: Goal: Ability to manage health-related needs will improve 07/11/2022 1138 by Herma Carson, RN Outcome: Adequate for Discharge 07/11/2022 1018 by Herma Carson, RN Outcome: Progressing 07/11/2022 0816 by Herma Carson, RN Outcome: Progressing   Problem: Clinical Measurements: Goal: Ability to maintain clinical measurements within normal limits will improve 07/11/2022 1138 by Herma Carson, RN Outcome: Adequate for Discharge 07/11/2022 1018 by Herma Carson, RN Outcome: Progressing 07/11/2022 0816 by Herma Carson, RN Outcome: Progressing Goal: Will remain free from infection 07/11/2022 1138 by Herma Carson, RN Outcome: Adequate for Discharge 07/11/2022 1018 by Herma Carson, RN Outcome: Progressing 07/11/2022 0816 by Herma Carson, RN Outcome: Progressing Goal: Diagnostic test results will improve 07/11/2022 1138 by Herma Carson, RN Outcome: Adequate for Discharge 07/11/2022 1018 by Herma Carson, RN Outcome: Progressing 07/11/2022 0816 by Herma Carson, RN Outcome: Progressing Goal: Respiratory complications will improve 07/11/2022 1138 by Herma Carson, RN Outcome: Adequate for Discharge 07/11/2022 1018 by Herma Carson, RN Outcome: Progressing 07/11/2022 0816 by Herma Carson, RN Outcome: Progressing Goal: Cardiovascular complication will be avoided 07/11/2022 1138 by Herma Carson, RN Outcome: Adequate for Discharge 07/11/2022 1018 by Herma Carson, RN Outcome: Progressing 07/11/2022 0816 by Herma Carson, RN Outcome:  Progressing   Problem: Activity: Goal: Risk for activity intolerance will decrease 07/11/2022 1138 by Herma Carson, RN Outcome: Adequate for Discharge 07/11/2022 1018 by Herma Carson, RN Outcome: Progressing 07/11/2022 0816 by Herma Carson, RN Outcome: Progressing   Problem: Nutrition: Goal: Adequate nutrition will be maintained 07/11/2022 1138 by Herma Carson, RN Outcome: Adequate for Discharge 07/11/2022 1018 by Herma Carson, RN Outcome: Progressing 07/11/2022 0816 by Herma Carson, RN Outcome: Progressing   Problem: Coping: Goal: Level of anxiety will decrease 07/11/2022 1138 by Herma Carson, RN Outcome: Adequate for Discharge 07/11/2022 1018 by Herma Carson, RN Outcome: Progressing 07/11/2022 0816 by Herma Carson, RN Outcome: Progressing   Problem: Elimination: Goal: Will not experience complications related to bowel motility 07/11/2022 1138 by Herma Carson, RN Outcome: Adequate for Discharge 07/11/2022 1018 by Herma Carson, RN Outcome: Progressing 07/11/2022 0816 by Herma Carson, RN Outcome: Progressing Goal: Will not experience complications related to urinary retention 07/11/2022 1138 by Herma Carson, RN Outcome: Adequate for Discharge 07/11/2022 1018 by Herma Carson, RN Outcome: Progressing 07/11/2022 0816 by Herma Carson, RN Outcome: Progressing   Problem: Pain Managment: Goal: General experience of comfort will improve 07/11/2022 1138 by Herma Carson, RN Outcome: Adequate for Discharge 07/11/2022 1018 by Herma Carson, RN Outcome: Progressing 07/11/2022 0816 by Herma Carson, RN Outcome: Progressing   Problem: Safety: Goal: Ability to remain free from injury will improve 07/11/2022 1138 by Herma Carson, RN Outcome: Adequate for Discharge 07/11/2022 1018 by Herma Carson, RN Outcome: Progressing 07/11/2022 0816 by Herma Carson, RN Outcome: Progressing   Problem: Skin Integrity: Goal: Risk for impaired skin  integrity will decrease 07/11/2022 1138 by Herma Carson, RN Outcome: Adequate for Discharge 07/11/2022 1018 by Herma Carson, RN Outcome: Progressing 07/11/2022 0816 by Herma Carson, RN  Outcome: Progressing   Problem: Acute Rehab PT Goals(only PT should resolve) Goal: Pt Will Go Supine/Side To Sit Outcome: Adequate for Discharge Goal: Patient Will Transfer Sit To/From Stand Outcome: Adequate for Discharge Goal: Pt Will Ambulate Outcome: Adequate for Discharge Goal: Pt/caregiver will Perform Home Exercise Program Outcome: Adequate for Discharge   Problem: Acute Rehab OT Goals (only OT should resolve) Goal: Pt. Will Perform Lower Body Dressing Outcome: Adequate for Discharge Goal: Pt. Will Transfer To Toilet Outcome: Adequate for Discharge Goal: Pt. Will Perform Toileting-Clothing Manipulation Outcome: Adequate for Discharge

## 2022-07-11 NOTE — Telephone Encounter (Signed)
Patient discharged on Friday 07/11/22. Needs TOC

## 2022-07-11 NOTE — TOC Progression Note (Signed)
Transition of Care [facility]) - Progression Note    Patient Details  Name: Carl Woods MRN: [id number] Date of Birth: 1931/06/07  [facility]) CM/SW Contact  [name], LCSWA Phone Number: 07/11/2022, 11:50 AM  Clinical Narrative:     CSW spoke with [name] with [facility] who confirmed patient can dc over today if medically ready. MD informed. CSW will continue to follow.   Expected Discharge Plan: Home w Home Health Services Barriers to Discharge: No Barriers Identified  Expected Discharge Plan and Services   Discharge Planning Services: CM Consult   Living arrangements for the past 2 months: Single Family Home Expected Discharge Date: 07/11/22                                     Social Determinants of Health (SDOH) Interventions SDOH Screenings   Food Insecurity: No Food Insecurity (07/08/2022)  Housing: Patient Declined (07/08/2022)  Transportation Needs: Patient Declined (07/08/2022)  Utilities: Patient Declined (07/08/2022)  Depression (PHQ2-9): Low Risk  (02/18/2022)  Tobacco Use: Medium Risk (07/10/2022)    Readmission Risk Interventions    07/07/2022    1:52 PM  Readmission Risk Prevention Plan  Transportation Screening Complete  PCP or Specialist Appt within 3-5 Days Complete  HRI or Home Care Consult Complete  Social Work Consult for Recovery Care Planning/Counseling Complete  Palliative Care Screening Not Applicable  Medication Review Oceanographer) Complete

## 2022-07-11 NOTE — Plan of Care (Signed)
  Problem: Education: Goal: Knowledge of General Education information will improve Description: Including pain rating scale, medication(s)/side effects and non-pharmacologic comfort measures 07/11/2022 1018 by Herma Carson, RN Outcome: Progressing 07/11/2022 0816 by Herma Carson, RN Outcome: Progressing   Problem: Health Behavior/Discharge Planning: Goal: Ability to manage health-related needs will improve 07/11/2022 1018 by Herma Carson, RN Outcome: Progressing 07/11/2022 0816 by Herma Carson, RN Outcome: Progressing   Problem: Clinical Measurements: Goal: Ability to maintain clinical measurements within normal limits will improve 07/11/2022 1018 by Herma Carson, RN Outcome: Progressing 07/11/2022 0816 by Herma Carson, RN Outcome: Progressing Goal: Will remain free from infection 07/11/2022 1018 by Herma Carson, RN Outcome: Progressing 07/11/2022 0816 by Herma Carson, RN Outcome: Progressing Goal: Diagnostic test results will improve 07/11/2022 1018 by Herma Carson, RN Outcome: Progressing 07/11/2022 0816 by Herma Carson, RN Outcome: Progressing Goal: Respiratory complications will improve 07/11/2022 1018 by Herma Carson, RN Outcome: Progressing 07/11/2022 0816 by Herma Carson, RN Outcome: Progressing Goal: Cardiovascular complication will be avoided 07/11/2022 1018 by Herma Carson, RN Outcome: Progressing 07/11/2022 0816 by Herma Carson, RN Outcome: Progressing   Problem: Activity: Goal: Risk for activity intolerance will decrease 07/11/2022 1018 by Herma Carson, RN Outcome: Progressing 07/11/2022 0816 by Herma Carson, RN Outcome: Progressing   Problem: Nutrition: Goal: Adequate nutrition will be maintained 07/11/2022 1018 by Herma Carson, RN Outcome: Progressing 07/11/2022 0816 by Herma Carson, RN Outcome: Progressing   Problem: Coping: Goal: Level of anxiety will decrease 07/11/2022 1018 by Herma Carson, RN Outcome:  Progressing 07/11/2022 0816 by Herma Carson, RN Outcome: Progressing   Problem: Elimination: Goal: Will not experience complications related to bowel motility 07/11/2022 1018 by Herma Carson, RN Outcome: Progressing 07/11/2022 0816 by Herma Carson, RN Outcome: Progressing Goal: Will not experience complications related to urinary retention 07/11/2022 1018 by Herma Carson, RN Outcome: Progressing 07/11/2022 0816 by Herma Carson, RN Outcome: Progressing   Problem: Pain Managment: Goal: General experience of comfort will improve 07/11/2022 1018 by Herma Carson, RN Outcome: Progressing 07/11/2022 0816 by Herma Carson, RN Outcome: Progressing   Problem: Safety: Goal: Ability to remain free from injury will improve 07/11/2022 1018 by Herma Carson, RN Outcome: Progressing 07/11/2022 0816 by Herma Carson, RN Outcome: Progressing   Problem: Skin Integrity: Goal: Risk for impaired skin integrity will decrease 07/11/2022 1018 by Herma Carson, RN Outcome: Progressing 07/11/2022 0816 by Herma Carson, RN Outcome: Progressing

## 2022-07-11 NOTE — TOC Transition Note (Addendum)
Transition of Care [facility]) - CM/SW Discharge Note   Patient Details  Name: Carl Woods MRN: [id number] Date of Birth: Feb 09, 1931  [facility]) CM/SW Contact:  [name], LCSWA Phone Number: 07/11/2022, 11:51 AM   Clinical Narrative:     Patient will DC to: [facility]  Anticipated DC date: 07/11/2022  Family notified: Control and instrumentation engineer by: [name]  ?  Per MD patient ready for DC to [facility] . RN, patient, patient's family, and facility notified of DC. Family will bring patients Cpap from home to facility.Discharge Summary sent to facility. RN given number for report tele# [phone] RM# [phone]. DC packet on chart. Ambulance transport requested for patient.  CSW signing off.   Final next level of care: Skilled Nursing Facility Barriers to Discharge: No Barriers Identified   Patient Goals and CMS Choice CMS Medicare.gov Compare Post Acute Care list provided to:: Patient Choice offered to / list presented to : Patient  Discharge Placement                Patient chooses bed at: [facility] [place] Patient to be transferred to facility by: [facility] Name of family member notified: [name] Patient and family notified of of transfer: 07/11/22  Discharge Plan and Services Additional resources added to the After Visit Summary for     Discharge Planning Services: CM Consult                                 Social Determinants of Health (SDOH) Interventions SDOH Screenings   Food Insecurity: No Food Insecurity (07/08/2022)  Housing: Patient Declined (07/08/2022)  Transportation Needs: Patient Declined (07/08/2022)  Utilities: Patient Declined (07/08/2022)  Depression (PHQ2-9): Low Risk  (02/18/2022)  Tobacco Use: Medium Risk (07/10/2022)     Readmission Risk Interventions    07/07/2022    1:52 PM  Readmission Risk Prevention Plan  Transportation Screening Complete  PCP or Specialist Appt within 3-5 Days Complete  HRI or Home Care Consult Complete   Social Work Consult for Recovery Care Planning/Counseling Complete  Palliative Care Screening Not Applicable  Medication Review Oceanographer) Complete

## 2022-07-11 NOTE — Progress Notes (Signed)
BRIEF DOCUMENTATION  Patient being discharged today.  Acute HFpEF He has done well with regards to diuresis. Net IO Since Admission: -16,115.12 mL [07/11/22 1255] Continue Farxiga and transition IV Bumex to oral.  Will uptitrate GDMT for HFpEF as outpatient for now focus on volume management  Permanent atrial fibrillation Known history of permanent atrial fibrillation prior to hospitalization. Given the fact that he presented with symptomatic anemia with hemoglobin close to 5 g/dL requiring 4 units of packed red blood cells shared decision was to hold off Eliquis. Patient and daughter aware that being off of Eliquis puts him at a high risk for thromboembolic events but they will be more cognizant of symptoms and seek help if they are present. Long-term anticoagulation can be discussed at the upcoming office visit with primary cardiologist on July 22, 2022 after discussing the risks/benefits and reviewing the recent endoscopy findings. Continue rate control strategy with metoprolol.  Follow-up appointment created for July 22, 2022 with Dr. [name] at 10:45 AM.  Tessa Lerner, DO, [facility]  Pager:  [phone] Office: [phone]

## 2022-07-11 NOTE — Discharge Summary (Signed)
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 On presentation, he was found to have hemoglobin of 5, elevated troponin. Report of melena. Given blood transfusion. Cardiology, GI consulted. S/P EGD .PT/OT recommended SNF on DC. He will be followed by cardiology as an outpatient.  Following problems were addressed during the hospitalization:  Symptomatic anemia/GI bleed: Presented with hemoglobin of 5, melena.  Given total of 4 units of blood transfusion during this hospitalization.  Currently hemoglobin stable.  GI following and he underwent EGD today.  EGD showed Normal esophagus without any esophageal or gastric varices, significant inflammation in the prepyloric antrum with a single ulcer with visible vessel which was clipped.  Findings suggestive of radiation gastritis .GI recommended PPI, Carafate. No bleeding around ampulla and stent in good position.  Plan for repeat ERCP as outpatient .  Continue PPI,carafate   NSTEMI/elevated troponin, elevated BNP/diastolic CHF: Presented with chest pain, shortness of breath suspected to be from symptomatic anemia.  Elevated troponin was mostly from demand ischemia from severe anemia.  Cardiology was following. Patient appeared volume overloaded on presentation with elevated BNP.  Given diuretics. Last echo done on 2023 showed EF of 55 to 60%. Currently on Bumex 1 mg daily.  Appears euvolemic today.  Also on Farxiga   Bilateral pleural  effusion: Underwent ultrasound-guided thoracentesis with removal of 1.2 L of pleural fluid from left.  Remains on room air without any dyspnea   Liver cirrhosis: As seen on the CT.  GI recommended outpatient follow-up.  Recommended ultrasound every 3 months for hepatocellular carcinoma screening.   Paroxysmal A-fib: Monitor on telemetry. Remains in A-fib with controlled rate.  On metoprolol for rate control.  Dose decreased to 12.5.  Eliquis will be held as per cardiology.   Hypothyroidism: Continue Synthyroid   Cancer of ampulla Vater: Status post radiation.  Plan for ERCP in July.  Follows with GI   CKD stage IIIb: Continue kidney function at baseline. stable ,continue to monitor.  Baseline creatinine around 1.9   Obesity: BMI of 31.1   Debility/deconditioning: Patient seen by PT/OT and recommended SNF on discharge.  TOC consulted       Discharge Diagnoses:  Principal Problem:   Symptomatic anemia Active Problems:   Iron deficiency anemia due to chronic blood loss   Long term (current) use of anticoagulants   Atrial fibrillation (HCC)   Cancer of ampulla of Vater (HCC)   Non-ST elevation (NSTEMI) myocardial infarction (HCC)   Elevated brain natriuretic peptide (BNP) level   Pleural effusion   Hypokalemia   Hypothyroidism   GERD (gastroesophageal reflux disease)   Shortness of breath   Hypervolemia   AKI (acute kidney injury) (HCC)   Acute heart failure with preserved ejection fraction (HFpEF) (HCC)   Preprocedural cardiovascular examination   Gastritis and gastroduodenitis   Gastric ulcer without hemorrhage or perforation   ABLA (acute blood loss anemia)    Discharge Instructions  Discharge Instructions     Diet - low sodium heart healthy   Complete by: As directed    Discharge instructions   Complete by: As directed  1)Please take prescribed medications as instructed 2)Do a CBC and BMP tests in a week 2)Follow up with cardiology as an outpatient.  You will  be called for appointment 4)We are stopping Eliquis for now.  You will be discussing with your cardiologist in the office appointment  if it needs to be resumed   Increase activity slowly   Complete by: As directed       Allergies as of 07/11/2022   No Known Allergies      Medication List     STOP taking these medications    apixaban 5 MG Tabs tablet Commonly known as: ELIQUIS   furosemide 20 MG tablet Commonly known as: LASIX   losartan 25 MG tablet Commonly known as: COZAAR   omeprazole 20 MG capsule Commonly known as: PRILOSEC       TAKE these medications    bumetanide 1 MG tablet Commonly known as: BUMEX Take 1 tablet (1 mg total) by mouth daily.   dapagliflozin propanediol 10 MG Tabs tablet Commonly known as: FARXIGA Take 10 mg by mouth in the morning.   fexofenadine 180 MG tablet Commonly known as: ALLEGRA Take 180 mg by mouth as needed for allergies or rhinitis.   Fusion 65-65-25-30 MG Caps Take 1 capsule by mouth 3 (three) times a week.   GLUCOSAMINE CHONDR 500 COMPLEX PO Take 1 tablet by mouth daily.   Glucosamine Sulfate 500 MG Tabs Take 500 mg by mouth daily.   ketoconazole 2 % cream Commonly known as: NIZORAL Apply 1 Application topically 2 (two) times daily as needed (skin rash/irritation.).   levothyroxine 112 MCG tablet Commonly known as: SYNTHROID Take 112 mcg by mouth daily before breakfast.   metoprolol succinate 25 MG 24 hr tablet Commonly known as: TOPROL-XL Take 0.5 tablets (12.5 mg total) by mouth daily. Take with or immediately following a meal. What changed: how much to take   nitroGLYCERIN 0.4 MG SL tablet Commonly known as: NITROSTAT Place 1 tablet (0.4 mg total) under the tongue every 5 (five) minutes as needed for up to 25 days for chest pain.   olopatadine 0.1 % ophthalmic solution Commonly known as: PATANOL Place 1 drop into both eyes daily as needed for allergies.   pantoprazole 40 MG tablet Commonly known as:  PROTONIX Take 1 tablet (40 mg total) by mouth 2 (two) times daily.   sodium chloride 0.65 % Soln nasal spray Commonly known as: OCEAN Place 1 spray into both nostrils as needed for congestion.   sucralfate 1 GM/10ML suspension Commonly known as: CARAFATE Take 10 mLs (1 g total) by mouth 4 (four) times daily -  with meals and at bedtime.   Vitamin D3 125 MCG (5000 UT) Caps Take 1 capsule by mouth every evening.        Contact information for after-discharge care     Destination     [facility], [place] [facility]erred SNF .   Service: Skilled Nursing Contact information: [address] [place] [address] [phone]                    No Known Allergies  Consultations: Cardiology,GI   Procedures/Studies: IR THORACENTESIS ASP PLEURAL SPACE W/IMG GUIDE  Result Date: 07/10/2022 INDICATION: 89 year old male with shortness of breath, previous chest x-ray showed bilateral pleural effusion. Request for therapeutic thoracentesis. EXAM: ULTRASOUND GUIDED LEFT THORACENTESIS MEDICATIONS: 10 mL 1% lidocaine COMPLICATIONS: None immediate. PROCEDURE: An ultrasound guided thoracentesis was thoroughly discussed with the patient and questions answered.  The benefits, risks, alternatives and complications were also discussed. The patient understands and wishes to proceed with the procedure. Written consent was obtained. Ultrasound was performed to localize and mark an adequate pocket of fluid in the left chest. The area was then prepped and draped in the normal sterile fashion. 1% Lidocaine was used for local anesthesia. Under ultrasound guidance a 6 Fr Safe-T-Centesis catheter was introduced. Thoracentesis was performed. The catheter was removed and a dressing applied. FINDINGS: A total of approximately 1.2 L of hazy yellow fluid was removed. Post procedure chest X-ray reviewed, negative for pneumothorax. IMPRESSION: Successful ultrasound guided left  thoracentesis yielding 1.2 L of pleural fluid. Performed by: Lawernce Ion, PA-C Electronically Signed   By: Richarda Overlie M.D.   On: 07/10/2022 14:50   DG Chest 1 View  Result Date: 07/10/2022 CLINICAL DATA:  Provided history: Pleural effusion. Post left-sided thoracentesis, 1.2 mL removed. EXAM: CHEST  1 VIEW COMPARISON:  Prior chest radiographs 07/09/2022 and earlier. CT abdomen/pelvis 07/07/2022. FINDINGS: The cardiomediastinal silhouette is unchanged. Aortic atherosclerosis. Redemonstrated foci of pleural calcification at the level of the mid left lung. No evidence of pneumothorax status post reported thoracentesis. There may be a trace residual left pleural effusion. Improved aeration of the left lung base with mild residual left basilar atelectasis. Persistent small right pleural effusion. Ill-defined opacity within the right lung base. No acute osseous abnormality identified. IMPRESSION: 1. No evidence of pneumothorax status post reported left thoracentesis. Possible trace residual left pleural effusion. Improved aeration of the left lung base with mild residual left basilar atelectasis. 2. Small right pleural effusion with right basilar atelectasis and/or pneumonia, unchanged. 3. Aortic Atherosclerosis (ICD10-I70.0). Electronically Signed   By: Jackey Loge D.O.   On: 07/10/2022 13:34   DG CHEST PORT 1 VIEW  Result Date: 07/09/2022 CLINICAL DATA:  Shortness of breath.  Pleural effusion. EXAM: PORTABLE CHEST 1 VIEW COMPARISON:  07/06/2022 FINDINGS: Mild cardiomegaly as seen previously. Bilateral pleural effusions persist with lower lobe atelectasis and or pneumonia. Clustered calcified granulomas in the left lung as seen previously. Compared to the study of 3 days ago, allowing for technical differences, findings are fairly similar. Upper lungs remain clear. IMPRESSION: Little change since the study of 3 days ago. Bilateral pleural effusions with lower lobe atelectasis and or pneumonia. Electronically  Signed   By: Paulina Fusi M.D.   On: 07/09/2022 08:13   CT ABDOMEN PELVIS W CONTRAST  Result Date: 07/07/2022 CLINICAL DATA:  Portal vein thrombosis and cirrhosis. Possible portal vein occlusion. EXAM: CT ABDOMEN AND PELVIS WITH CONTRAST TECHNIQUE: Multidetector CT imaging of the abdomen and pelvis was performed using the standard protocol following bolus administration of intravenous contrast. RADIATION DOSE REDUCTION: This exam was performed according to the departmental dose-optimization program which includes automated exposure control, adjustment of the mA and/or kV according to patient size and/or use of iterative reconstruction technique. CONTRAST:  60mL OMNIPAQUE IOHEXOL 350 MG/ML SOLN COMPARISON:  Abdominal ultrasound 07/06/2022. PET-CT 02/13/2022. CT abdomen and pelvis 12/06/2021. MRI abdomen 12/16/2021 FINDINGS: Lower chest: Large bilateral pleural effusions with basilar consolidation or atelectasis. Small right pericardial cyst is unchanged since prior study. Hepatobiliary: Hepatic cirrhosis with enlarged lateral segment left lobe of liver and nodular contour to the liver. No focal lesions are identified. The contrast bolus is poor, limiting the evaluation, but there appears to be flow demonstrated in the portal veins without obvious thrombosis. Gallbladder wall edema is likely related to liver disease. There is a stent in the common bile duct  with associated pneumobilia. Pancreas: Unremarkable. No pancreatic ductal dilatation or surrounding inflammatory changes. Spleen: Normal in size without focal abnormality. Adrenals/Urinary Tract: No adrenal gland nodules. Bilateral parapelvic cysts. No imaging follow-up is indicated. No hydronephrosis or hydroureter. Bladder is normal. Stomach/Bowel: Stomach, small bowel, and colon are not abnormally distended. Contrast material throughout most of the colon. 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 Vascular/Lymphatic: Aortic atherosclerosis. No enlarged abdominal or pelvic lymph nodes. Reproductive: Prostate is unremarkable. Other: No free air or free fluid in the abdomen. Mild infiltration in the mesentery, pericolic gutters, and pelvis. This could be early ascites or inflammatory reaction. There is a small supraumbilical hernia containing fat with increased density in the fat suggesting fat necrosis. No change since prior study. Edema in the subcutaneous fat throughout the abdomen and pelvis. Musculoskeletal: Degenerative changes in the spine. Compression of the L1 vertebra, unchanged. Mild anterior subluxation of L4 on L5 is unchanged, likely degenerative. IMPRESSION: 1. Hepatic cirrhosis. No focal lesions. The contrast bolus is somewhat limited but no obvious portal venous thrombosis is demonstrated in the central portal veins appear to have flow. 2. Gallbladder wall edema is likely due to liver disease. Bile duct stent with pneumobilia. 3. 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 4. Diffuse edema demonstrated in the subcutaneous fat, mesentery, and intra peritoneum. 5. Aortic atherosclerosis. 6. Small supraumbilical hernia with fat herniation and fat necrosis. 7. Large bilateral pleural effusions with basilar atelectasis or consolidation. Electronically Signed   By: Burman Nieves M.D.   On: 07/07/2022 19:42   ECHOCARDIOGRAM COMPLETE  Result Date: 07/07/2022    ECHOCARDIOGRAM REPORT   Patient Name:   KAMYRON GEORGER Date of Exam: 07/07/2022 Medical Rec #:  [id number]    Height:       68.0 in Accession #:    [id number]   Weight:       215.6 lb Date of Birth:  [birth year]    BSA:          2.110 m Patient Age:    89 years     BP:           104/52 mmHg Patient Gender: M            HR:           74 bpm. Exam Location:  Inpatient Procedure: 2D Echo, Cardiac Doppler, Color Doppler and Intracardiac             Opacification Agent Indications:    NSTEMI, Chest Pain  History:        Patient has prior history of Echocardiogram examinations, most                 recent 06/18/2021. Cancer, Arrythmias:Atrial Fibrillation,                 Signs/Symptoms:Shortness of Breath and Chest Pain; Risk                 Factors:Former Smoker and Sleep Apnea.  Sonographer:    Wallie Char Referring Phys: [id number] Tessa Lerner  Sonographer Comments: Technically difficult study due to poor echo windows. Image acquisition challenging due to respiratory motion. IMPRESSIONS  1. Left ventricular ejection fraction, by estimation, is 60 to 65%. The left ventricle has normal function. The left ventricle has no regional wall motion abnormalities. Left ventricular diastolic function could not be evaluated. Elevated left atrial pressure. The E/e'  is 16.5.  2. Right ventricular systolic function is low normal. The right ventricular size is mildly enlarged. There is moderately elevated pulmonary artery systolic pressure. The estimated right ventricular systolic pressure is 53.4 mmHg.  3. Right atrial size was dilated.  4. A small pericardial effusion is present. There is no evidence of cardiac tamponade.  5. The mitral valve is degenerative. Trivial mitral valve regurgitation. No evidence of mitral stenosis.  6. The aortic valve is grossly normal. Aortic valve regurgitation is not visualized. Aortic valve sclerosis is present, with no evidence of aortic valve stenosis.  7. The inferior vena cava is dilated in size with <50% respiratory variability, suggesting right atrial pressure of 15 mmHg.  8. Rhythm strip during this exam demonstrates atrial fibrillation. Comparison(s): Outside echo 06/18/2021: LVEF 55-60%,mild TR, RVSP . FINDINGS  Left Ventricle: Left ventricular ejection fraction, by estimation, is 60 to 65%. The left ventricle has normal function. The left ventricle has no regional wall motion abnormalities. Definity contrast agent was given  IV to delineate the left ventricular  endocardial borders. The left ventricular internal cavity size was normal in size. There is no left ventricular hypertrophy. Left ventricular diastolic function could not be evaluated due to atrial fibrillation. Left ventricular diastolic function could  not be evaluated. Elevated left atrial pressure. The E/e' is 16.5. Right Ventricle: The right ventricular size is mildly enlarged. Right vetricular wall thickness was not well visualized. Right ventricular systolic function is low normal. There is moderately elevated pulmonary artery systolic pressure. The tricuspid regurgitant velocity is 3.10 m/s, and with an assumed right atrial pressure of 15 mmHg, the estimated right ventricular systolic pressure is 53.4 mmHg. Left Atrium: Left atrial size was normal in size. Right Atrium: Right atrial size was dilated. Pericardium: A small pericardial effusion is present. There is no evidence of cardiac tamponade. Mitral Valve: The mitral valve is degenerative in appearance. Trivial mitral valve regurgitation. No evidence of mitral valve stenosis. MV peak gradient, 8.2 mmHg. The mean mitral valve gradient is 2.7 mmHg. Tricuspid Valve: The tricuspid valve is not well visualized. Tricuspid valve regurgitation is mild . No evidence of tricuspid stenosis. Aortic Valve: The aortic valve is grossly normal. Aortic valve regurgitation is not visualized. Aortic valve sclerosis is present, with no evidence of aortic valve stenosis. Aortic valve mean gradient measures 4.2 mmHg. Aortic valve peak gradient measures 8.0 mmHg. Aortic valve area, by VTI measures 3.33 cm. Pulmonic Valve: The pulmonic valve was not well visualized. Pulmonic valve regurgitation is trivial. No evidence of pulmonic stenosis. Aorta: The aortic root and ascending aorta are structurally normal, with no evidence of dilitation. Venous: The inferior vena cava is dilated in size with less than 50% respiratory variability, suggesting  right atrial pressure of 15 mmHg. IAS/Shunts: No atrial level shunt detected by color flow Doppler. EKG: Rhythm strip during this exam demonstrates atrial fibrillation.  LEFT VENTRICLE PLAX 2D LVIDd:         4.20 cm      Diastology LVIDs:         2.90 cm      LV e' medial:    9.73 cm/s LV PW:         1.00 cm      LV E/e' medial:  15.4 LV IVS:        0.60 cm      LV e' lateral:   8.61 cm/s LVOT diam:     2.10 cm      LV E/e' lateral: 17.5  LV SV:         95 LV SV Index:   45 LVOT Area:     3.46 cm  LV Volumes (MOD) LV vol d, MOD A2C: 134.0 ml LV vol d, MOD A4C: 106.0 ml LV vol s, MOD A2C: 43.0 ml LV vol s, MOD A4C: 35.5 ml LV SV MOD A2C:     91.0 ml LV SV MOD A4C:     106.0 ml LV SV MOD BP:      82.9 ml RIGHT VENTRICLE             IVC RV S prime:     12.10 cm/s  IVC diam: 2.60 cm TAPSE (M-mode): 1.3 cm LEFT ATRIUM             Index        RIGHT ATRIUM           Index LA diam:        4.20 cm 1.99 cm/m   RA Area:     21.80 cm LA Vol (A2C):   52.4 ml 24.83 ml/m  RA Volume:   67.40 ml  31.94 ml/m LA Vol (A4C):   53.2 ml 25.21 ml/m LA Biplane Vol: 54.3 ml 25.73 ml/m  AORTIC VALVE AV Area (Vmax):    3.24 cm AV Area (Vmean):   3.09 cm AV Area (VTI):     3.33 cm AV Vmax:           141.50 cm/s AV Vmean:          94.500 cm/s AV VTI:            0.286 m AV Peak Grad:      8.0 mmHg AV Mean Grad:      4.2 mmHg LVOT Vmax:         132.25 cm/s LVOT Vmean:        84.250 cm/s LVOT VTI:          0.275 m LVOT/AV VTI ratio: 0.96  AORTA Ao Root diam: 3.40 cm Ao Asc diam:  3.30 cm MITRAL VALVE                TRICUSPID VALVE MV Area (PHT): 3.30 cm     TR Peak grad:   38.4 mmHg MV Area VTI:   2.57 cm     TR Vmax:        310.00 cm/s MV Peak grad:  8.2 mmHg MV Mean grad:  2.7 mmHg     SHUNTS MV Vmax:       1.43 m/s     Systemic VTI:  0.28 m MV Vmean:      74.5 cm/s    Systemic Diam: 2.10 cm MV Decel Time: 230 msec MV E velocity: 150.33 cm/s MV A velocity: 73.33 cm/s MV E/A ratio:  2.05 Sunit Tolia DO Electronically signed by Tessa Lerner DO Signature Date/Time: 07/07/2022/9:52:12 AM    Final    US Abdomen Complete  Result Date: 07/06/2022 CLINICAL DATA:  Cirrhosis. EXAM: ABDOMEN ULTRASOUND COMPLETE COMPARISON:  PET CT 02/13/2022, MRCP 12/16/2021 FINDINGS: Gallbladder: Partially distended. Wall thickening of 6 mm. No gallstones. No sonographic Murphy sign noted by sonographer. Common bile duct: Diameter: Previous common bile duct dilatation is not seen on the current exam, visualized common bile duct measures 4 mm. Pneumobilia on prior PET is not seen by ultrasound. Liver: Heterogeneous hepatic parenchyma, mildly increased. Nodular patter contours. There is no discrete hepatic lesion, although portions of the liver are suboptimally  assessed. No flow is demonstrated in the main portal vein. IVC: No abnormality visualized. Pancreas: Visualized portion unremarkable. Spleen: Size and appearance within normal limits. No splenomegaly, greatest splenic length of 10.3 cm. Right Kidney: Length: 8.5 cm. Increased parenchymal echogenicity with renal parenchymal thinning. No hydronephrosis. No evidence of focal lesion. Left Kidney: Length: 8.8 cm. Increased parenchymal echogenicity with renal parenchymal thinning. No hydronephrosis. Renal sinus cysts on prior PET are not well seen. No evidence of stone or focal lesion. Abdominal aorta: No aneurysm visualized. Other findings: No definite abdominal ascites. IMPRESSION: 1. Technically limited exam. Cirrhotic hepatic morphology, no evidence of focal liver lesion. 2. No flow is demonstrated in the main portal vein. It is unclear if this is due to technical factors or true portal vein occlusion. Recommend contrast-enhanced CT for further assessment. 3. Increased renal parenchymal echogenicity with parenchymal thinning typical of chronic medical renal disease. No hydronephrosis. Electronically Signed   By: Narda Rutherford M.D.   On: 07/06/2022 17:32   DG Chest Portable 1 View  Result Date:  07/06/2022 CLINICAL DATA:  Chest pain EXAM: PORTABLE CHEST 1 VIEW COMPARISON:  Chest CT 02/07/2022. FINDINGS: The heart is enlarged, unchanged. The upper mediastinal contours are normal. There are small left larger than right pleural effusions with adjacent airspace opacities in the lung bases. The upper lungs well aerated. There is no evidence of overt pulmonary edema. There is no pneumothorax There is no acute osseous abnormality. IMPRESSION: Left larger than right pleural effusions with adjacent airspace opacities which may reflect atelectasis or pneumonia. Electronically Signed   By: Lesia Hausen M.D.   On: 07/06/2022 10:01      Subjective: Patient seen and examined at bedside today.  Hemodynamically stable comfortable. eating his breakfast.  Medically stable for discharge to SNF today.  I called his son and discussed about discharge planning  Discharge Exam: Vitals:   07/11/22 0353 07/11/22 0815  BP: (!) 112/48 113/69  Pulse: 85 90  Resp: 19 (!) 27  Temp: 98.5 F (36.9 C) 98.1 F (36.7 C)  SpO2: 96% 96%   Vitals:   07/10/22 1556 07/10/22 2037 07/11/22 0353 07/11/22 0815  BP: (!) 102/44 (!) 108/56 (!) 112/48 113/69  Pulse: 74 86 85 90  Resp: 20 20 19  (!) 27  Temp: 98.2 F (36.8 C) 98.5 F (36.9 C) 98.5 F (36.9 C) 98.1 F (36.7 C)  TempSrc: Oral Oral Oral Oral  SpO2: 100% 98% 96% 96%  Weight:   88.5 kg   Height:        General: Pt is alert, awake, not in acute distress Cardiovascular: RRR, S1/S2 +, no rubs, no gallops Respiratory: CTA bilaterally, no wheezing, no rhonchi Abdominal: Soft, NT, ND, bowel sounds + Extremities: no edema, no cyanosis    The results of significant diagnostics from this hospitalization (including imaging, microbiology, ancillary and laboratory) are listed below for reference.     Microbiology: No results found for this or any previous visit (from the past 240 hour(s)).   Labs: BNP (last 3 results) Recent Labs    07/06/22 0936  07/10/22 0139  BNP 413.5* 358.5*   Basic Metabolic Panel: Recent Labs  Lab 07/06/22 0934 07/06/22 2100 07/07/22 0304 07/08/22 0237 07/09/22 0126 07/09/22 0930 07/10/22 0139  NA 137 139 138 137 137 137 137  K 3.4* 3.4* 3.3* 4.1 4.1 4.7 3.7  CL 111 108 108 104 103 107 106  CO2 18* 20* 22 22 25   --  24  GLUCOSE 133* 135* 108*  104* 106* 99 117*  BUN 28* 28* 29* 29* 31* 36* 25*  CREATININE 1.50* 1.74* 1.70* 1.58* 1.57* 1.60* 1.48*  CALCIUM 8.7* 8.6* 8.2* 8.6* 8.3*  --  8.3*  MG 1.5* 1.6*  --  1.7  --   --   --   PHOS  --   --   --  2.1*  --   --   --    Liver Function Tests: Recent Labs  Lab 07/06/22 2100 07/07/22 0304 07/08/22 0237  AST 28 23  --   ALT 14 13  --   ALKPHOS 54 51  --   BILITOT 1.4* 1.1  --   PROT 5.8* 5.1*  --   ALBUMIN 2.7* 2.3* 2.5*   No results for input(s): "LIPASE", "AMYLASE" in the last 168 hours. No results for input(s): "AMMONIA" in the last 168 hours. CBC: Recent Labs  Lab 07/06/22 2100 07/07/22 0304 07/07/22 1733 07/08/22 0818 07/09/22 0126 07/09/22 0930 07/10/22 0139 07/11/22 0154  WBC 10.8* 8.3  --   --  5.0  --  5.1 8.4  NEUTROABS 9.1*  --   --   --   --   --   --   --   HGB 7.0* 6.5*   < > 8.9* 8.6* 8.8* 8.5* 8.9*  HCT 22.1* 20.1*   < > 27.5* 26.1* 26.0* 26.2* 27.9*  MCV 107.3* 107.5*  --   --  100.8*  --  101.2* 101.5*  PLT 93* 102*  --   --  PLATELET CLUMPS NOTED ON SMEAR, UNABLE TO ESTIMATE  --  PLATELET CLUMPS NOTED ON SMEAR, COUNT APPEARS DECREASED PLATELET CLUMPS NOTED ON SMEAR, UNABLE TO ESTIMATE   < > = values in this interval not displayed.   Cardiac Enzymes: No results for input(s): "CKTOTAL", "CKMB", "CKMBINDEX", "TROPONINI" in the last 168 hours. BNP: Invalid input(s): "POCBNP" CBG: No results for input(s): "GLUCAP" in the last 168 hours. D-Dimer No results for input(s): "DDIMER" in the last 72 hours. Hgb A1c No results for input(s): "HGBA1C" in the last 72 hours. Lipid Profile No results for input(s): "CHOL",  "HDL", "LDLCALC", "TRIG", "CHOLHDL", "LDLDIRECT" in the last 72 hours. Thyroid function studies No results for input(s): "TSH", "T4TOTAL", "T3FREE", "THYROIDAB" in the last 72 hours.  Invalid input(s): "FREET3" Anemia work up No results for input(s): "VITAMINB12", "FOLATE", "FERRITIN", "TIBC", "IRON", "RETICCTPCT" in the last 72 hours. Urinalysis No results found for: "COLORURINE", "APPEARANCEUR", "LABSPEC", "PHURINE", "GLUCOSEU", "HGBUR", "BILIRUBINUR", "KETONESUR", "PROTEINUR", "UROBILINOGEN", "NITRITE", "LEUKOCYTESUR" Sepsis Labs Recent Labs  Lab 07/07/22 0304 07/09/22 0126 07/10/22 0139 07/11/22 0154  WBC 8.3 5.0 5.1 8.4   Microbiology No results found for this or any previous visit (from the past 240 hour(s)).  Please note: You were cared for by a hospitalist during your hospital stay. Once you are discharged, your primary care physician will handle any further medical issues. Please note that NO REFILLS for any discharge medications will be authorized once you are discharged, as it is imperative that you return to your primary care physician (or establish a relationship with a primary care physician if you do not have one) for your post hospital discharge needs so that they can reassess your need for medications and monitor your lab values.    Time coordinating discharge: 40 minutes  SIGNED:   Burnadette Pop, MD  [facility] 07/11/2022, 11:17 AM Pager [phone]  If 7PM-7AM, please contact night-coverage [email or web address] Password [email or web address]

## 2022-07-13 ENCOUNTER — Encounter (HOSPITAL_COMMUNITY): Payer: Self-pay | Admitting: Gastroenterology

## 2022-07-14 NOTE — Telephone Encounter (Signed)
LMTCB

## 2022-07-14 NOTE — Telephone Encounter (Deleted)
.  TOCscript

## 2022-07-17 ENCOUNTER — Inpatient Hospital Stay: Payer: PPO

## 2022-07-20 DIAGNOSIS — D649 Anemia, unspecified: Secondary | ICD-10-CM | POA: Diagnosis not present

## 2022-07-20 DIAGNOSIS — I4891 Unspecified atrial fibrillation: Secondary | ICD-10-CM | POA: Diagnosis not present

## 2022-07-20 DIAGNOSIS — E039 Hypothyroidism, unspecified: Secondary | ICD-10-CM | POA: Diagnosis not present

## 2022-07-20 DIAGNOSIS — I2510 Atherosclerotic heart disease of native coronary artery without angina pectoris: Secondary | ICD-10-CM | POA: Diagnosis not present

## 2022-07-20 DIAGNOSIS — I5031 Acute diastolic (congestive) heart failure: Secondary | ICD-10-CM | POA: Diagnosis not present

## 2022-07-20 DIAGNOSIS — R2681 Unsteadiness on feet: Secondary | ICD-10-CM | POA: Diagnosis not present

## 2022-07-21 ENCOUNTER — Telehealth: Payer: Self-pay | Admitting: Hematology

## 2022-07-22 ENCOUNTER — Encounter: Payer: Self-pay | Admitting: Cardiology

## 2022-07-22 ENCOUNTER — Ambulatory Visit: Payer: PPO | Admitting: Cardiology

## 2022-07-22 VITALS — BP 119/55 | HR 80 | Resp 16 | Ht 68.0 in | Wt 202.8 lb

## 2022-07-22 DIAGNOSIS — Z8719 Personal history of other diseases of the digestive system: Secondary | ICD-10-CM | POA: Diagnosis not present

## 2022-07-22 DIAGNOSIS — I10 Essential (primary) hypertension: Secondary | ICD-10-CM | POA: Diagnosis not present

## 2022-07-22 DIAGNOSIS — I4821 Permanent atrial fibrillation: Secondary | ICD-10-CM

## 2022-07-22 DIAGNOSIS — N1831 Chronic kidney disease, stage 3a: Secondary | ICD-10-CM | POA: Diagnosis not present

## 2022-07-22 NOTE — Progress Notes (Signed)
Primary Physician/Referring:  [name], MD  Patient ID: Carl Woods, male    DOB: July 19, 1931, 89 y.o.   MRN: [id number]  Chief Complaint  Patient presents with   Permanent atrial fibrillation    HPI:    Carl Woods  is a 89 y.o.Caucasian male with permanent atrial fibrillation, obstructive sleep apnea on CPAP and compliant, hypertension, diabetes mellitus with stage IIIa chronic kidney disease, mild centrilobular emphysema,  presents here for [facility] visit for type II MI associated with severe GI bleed and admitted to the hospital on 07/06/2022 requiring 4 units of packed RBC transfusion, hypertension and atrial fibrillation follow-up.  He has no specific complaints from cardiac standpoint.  Leg edema has remained stable.  States that he is recuperating from recent hospitalization and is getting stronger.   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   Left Forehead (treatment after biopsy)   OSA on CPAP    Perianal rash    Recurrent epistaxis    Renal cyst 01/04/2013   Small left peripelvic renal cysts , noted on US Renal   SCCA (squamous cell carcinoma) of skin 10/17/2015   Left Sup Bridge of Nose (curet, cautery and 5FU)   Seasonal allergies    Superficial basal cell carcinoma (BCC) 10/17/2015   Left Bulb of Nose (curet, cautery and 5FU)   Tubular adenoma    Past Surgical History:  Procedure Laterality Date   BILIARY  STENT PLACEMENT N/A 01/23/2022   Procedure: BILIARY STENT PLACEMENT;  Surgeon: Lemar Lofty., MD;  Location: [facility];  Service: Gastroenterology;  Laterality: N/A;   BIOPSY  01/23/2022   Procedure: BIOPSY;  Surgeon: Meridee Score Netty Starring., MD;  Location: [facility];  Service: Gastroenterology;;   BIOPSY  07/09/2022   Procedure: BIOPSY;  Surgeon: Shellia Cleverly, DO;  Location: [facility];  Service: Gastroenterology;;   COLONOSCOPY  multiple   CYSTOSCOPY     ENDOSCOPIC RETROGRADE CHOLANGIOPANCREATOGRAPHY (ERCP) WITH PROPOFOL N/A 01/23/2022   Procedure: ENDOSCOPIC RETROGRADE CHOLANGIOPANCREATOGRAPHY (ERCP) WITH PROPOFOL;  Surgeon: Lemar Lofty., MD;  Location: [facility];  Service: Gastroenterology;  Laterality: N/A;   ESOPHAGOGASTRODUODENOSCOPY  multiple   ESOPHAGOGASTRODUODENOSCOPY N/A 01/23/2022   Procedure: ESOPHAGOGASTRODUODENOSCOPY (EGD);  Surgeon: Lemar Lofty., MD;  Location: [facility];  Service: Gastroenterology;  Laterality: N/A;   ESOPHAGOGASTRODUODENOSCOPY N/A 07/09/2022   Procedure: ESOPHAGOGASTRODUODENOSCOPY (EGD);  Surgeon: Shellia Cleverly, DO;  Location: [facility];  Service: Gastroenterology;  Laterality: N/A;   EUS N/A 01/23/2022   Procedure: UPPER ENDOSCOPIC ULTRASOUND (EUS) RADIAL;  Surgeon: Lemar Lofty., MD;  Location: [facility];  Service: Gastroenterology;  Laterality: N/A;   EVALUATION UNDER ANESTHESIA WITH FISTULECTOMY N/A 03/02/2018   Procedure: ANORECTAL EXAM UNDER ANESTHESIA WITH REPAIR OF SUPERFICIAL PERIRECTAL FISTULA AND HEMORRHOIDECTOMY;  Surgeon: [name], MD;  Location: [facility];  Service: General;  Laterality: N/A;   EXPLORATORY LAPAROTOMY     HEMOSTASIS CLIP PLACEMENT  07/09/2022   Procedure: HEMOSTASIS CLIP PLACEMENT;  Surgeon: Shellia Cleverly, DO;  Location: [facility];  Service: Gastroenterology;;   IR THORACENTESIS ASP PLEURAL SPACE W/IMG GUIDE  07/10/2022   REMOVAL OF STONES  01/23/2022    Procedure: REMOVAL OF STONES;  Surgeon: Lemar Lofty., MD;  Location: [facility];  Service: Gastroenterology;;   Dennison Mascot  01/23/2022   Procedure: Dennison Mascot;  Surgeon: Lemar Lofty., MD;  Location: [facility];  Service: Gastroenterology;;     Social History   Tobacco Use   Smoking status: Former    Packs/day: 0.50    Years: 40.00    Additional pack years: 0.00    Total pack years: 20.00    Types: Cigarettes    Quit date: 2006    Years since quitting: 18.4   Smokeless tobacco: Never  Substance Use Topics   Alcohol use: Yes    Comment: occasional wine   Marital Status: Married  ROS  Review of Systems  Cardiovascular:  Positive for dyspnea on exertion and leg swelling. Negative for chest pain.   Objective      07/22/2022   10:30 AM 07/11/2022    8:15 AM 07/11/2022    3:53 AM  Vitals with BMI  Height 5\' 8"     Weight 202 lbs 13 oz  195 lbs  BMI 30.84  29.66  Systolic 119 113 604  Diastolic 55 69 48  Pulse 80 90 85   SpO2: 98 %  Physical Exam Constitutional:      Appearance: He is obese.  Neck:     Vascular: No carotid bruit or JVD.  Cardiovascular:     Rate and Rhythm: Normal rate. Rhythm irregular.     Pulses:          Dorsalis pedis pulses are 0 on the right side and 0 on the left side.       Posterior tibial pulses are 0 on the right side and 0 on the left side.     Heart sounds: No murmur heard. Pulmonary:     Effort: Pulmonary effort is normal.     Breath sounds: Normal breath sounds.  Abdominal:     General: Bowel sounds are normal.     Palpations: Abdomen is soft.  Musculoskeletal:     Right lower leg: Edema (2-3+ chronic bilateral leg edema with venous stents this changes and thick skin) present.     Left lower leg: Edema (2-3+ chronic bilateral leg edema with venous stents this changes and thick skin) present.  Skin:    Capillary Refill: Capillary refill takes less than 2 seconds.     Laboratory examination:    Recent Labs    07/08/22 0237 07/09/22 0126 07/09/22 0930 07/10/22 0139  NA 137 137 137 137  K 4.1 4.1 4.7 3.7  CL 104 103 107 106  CO2 22 25  --  24  GLUCOSE 104* 106* 99 117*  BUN 29* 31* 36* 25*  CREATININE 1.58* 1.57* 1.60* 1.48*  CALCIUM 8.6* 8.3*  --  8.3*  GFRNONAA 42* 42*  --  45*    Lab Results  Component Value Date   GLUCOSE 117 (H) 07/10/2022   NA 137 07/10/2022   K 3.7 07/10/2022   CL 106 07/10/2022   CO2 24 07/10/2022   BUN 25 (H) 07/10/2022   CREATININE 1.48 (H) 07/10/2022   GFRNONAA 45 (L) 07/10/2022   CALCIUM 8.3 (  L) 07/10/2022   PHOS 2.1 (L) 07/08/2022   PROT 5.1 (L) 07/07/2022   ALBUMIN 2.5 (L) 07/08/2022   BILITOT 1.1 07/07/2022   ALKPHOS 51 07/07/2022   AST 23 07/07/2022   ALT 13 07/07/2022   ANIONGAP 7 07/10/2022      Lab Results  Component Value Date   ALT 13 07/07/2022   AST 23 07/07/2022   ALKPHOS 51 07/07/2022   BILITOT 1.1 07/07/2022       Latest Ref Rng & Units 07/08/2022    2:37 AM 07/07/2022    3:04 AM 07/06/2022    9:00 PM  Hepatic Function  Total Protein 6.5 - 8.1 g/dL  5.1  5.8   Albumin 3.5 - 5.0 g/dL 2.5  2.3  2.7   AST 15 - 41 U/L  23  28   ALT 0 - 44 U/L  13  14   Alk Phosphatase 38 - 126 U/L  51  54   Total Bilirubin 0.3 - 1.2 mg/dL  1.1  1.4       Latest Ref Rng & Units 07/11/2022    1:54 AM 07/10/2022    1:39 AM 07/09/2022    9:30 AM  CBC  WBC 4.0 - 10.5 K/uL 8.4  5.1    Hemoglobin 13.0 - 17.0 g/dL 8.9  8.5  8.8   Hematocrit 39.0 - 52.0 % 27.9  26.2  26.0   Platelets 150 - 400 K/uL PLATELET CLUMPS NOTED ON SMEAR, UNABLE TO ESTIMATE  PLATELET CLUMPS NOTED ON SMEAR, COUNT APPEARS DECREASED      External labs:   Cholesterol, total 92.000 mg 09/26/2020 HDL 31.000 mg 09/26/2020 LDL 46.000 MG 05/22/2020 Triglycerides 70.000 mg 08/13/2021  A1C 6.500 % 08/13/2021  Radiology:    Cardiac Studies:   PCV MYOCARDIAL PERFUSION WITH LEXISCAN 02/08/2020  Narrative Lexiscan Tetrofosmin stress test  02/08/2020: Eugenie Birks nuclear stress test performed using 1-day protocol. Normal myocardial perfusion. Stress LVEF 76%. Low risk study.  PCV ECHOCARDIOGRAM COMPLETE 06/18/2021  Narrative Echocardiogram 06/18/2021: Normal LV systolic function with visual EF 55-60%. Left ventricle cavity is normal in size. Normal left ventricular wall thickness. Normal global wall motion. Unable to evaluate diastolic function due to atrial fibrillation. Trace aortic regurgitation. Mild tricuspid regurgitation. No evidence of pulmonary hypertension. RVSP measures 33 mmHg. Compared to 02/06/2020 small pericardial effusion has resolved otherwise no significant change.  EKG:   EKG 07/22/2022: Atrial fibrillation with controlled ventricular response at the rate of 65 bpm, normal axis, diffuse nonspecific T abnormality.  Low-voltage complexes.  Compared to 06/19/2022, no significant change.  Medications and allergies  No Known Allergies   Medication list   Current Outpatient Medications:    bumetanide (BUMEX) 1 MG tablet, Take 1 tablet (1 mg total) by mouth daily., Disp: , Rfl:    Cholecalciferol (VITAMIN D3) 5000 units CAPS, Take 1 capsule by mouth every evening., Disp: , Rfl:    dapagliflozin propanediol (FARXIGA) 10 MG TABS tablet, Take 10 mg by mouth in the morning., Disp: , Rfl:    Fe Fum-Fe Poly-Vit C-Lactobac (FUSION) 65-65-25-30 MG CAPS, Take 1 capsule by mouth 3 (three) times a week., Disp: , Rfl:    fexofenadine (ALLEGRA) 180 MG tablet, Take 180 mg by mouth as needed for allergies or rhinitis., Disp: , Rfl:    Glucosamine Sulfate 500 MG TABS, Take 500 mg by mouth daily., Disp: , Rfl:    Glucosamine-Chondroit-Vit C-Mn (GLUCOSAMINE CHONDR 500 COMPLEX PO), Take 1 tablet by mouth daily. , Disp: , Rfl:  ketoconazole (NIZORAL) 2 % cream, Apply 1 Application topically 2 (two) times daily as needed (skin rash/irritation.)., Disp: , Rfl:    levothyroxine (SYNTHROID, LEVOTHROID) 112 MCG tablet, Take 112 mcg by  mouth daily before breakfast. , Disp: , Rfl:    metoprolol succinate (TOPROL-XL) 25 MG 24 hr tablet, Take 0.5 tablets (12.5 mg total) by mouth daily. Take with or immediately following a meal., Disp: 30 tablet, Rfl: 0   nitroGLYCERIN (NITROSTAT) 0.4 MG SL tablet, Place 1 tablet (0.4 mg total) under the tongue every 5 (five) minutes as needed for up to 25 days for chest pain., Disp: 25 tablet, Rfl: 3   olopatadine (PATANOL) 0.1 % ophthalmic solution, Place 1 drop into both eyes daily as needed for allergies., Disp: , Rfl:    pantoprazole (PROTONIX) 40 MG tablet, Take 1 tablet (40 mg total) by mouth 2 (two) times daily., Disp: , Rfl:    sodium chloride (OCEAN) 0.65 % SOLN nasal spray, Place 1 spray into both nostrils as needed for congestion., Disp: , Rfl:    sucralfate (CARAFATE) 1 GM/10ML suspension, Take 10 mLs (1 g total) by mouth 4 (four) times daily -  with meals and at bedtime., Disp: 420 mL, Rfl: 0  Assessment     ICD-10-CM   1. Permanent atrial fibrillation (HCC)  I48.21 EKG 12-Lead    2. H/O: upper GI bleed  Z87.19     3. Primary hypertension  I10     4. Stage 3a chronic kidney disease (HCC)  N18.31        Orders Placed This Encounter  Procedures   EKG 12-Lead    No orders of the defined types were placed in this encounter.   There are no discontinued medications.    CHA2DS2-VASc Score is 4.  Yearly risk of stroke: 5% (A, HTN, DM).  Score of 1=0.6; 2=2.2; 3=3.2; 4=4.8; 5=7.2; 6=9.8; 7=>9.8) -(CHF; HTN; vasc disease DM,  Male = 1; Age <65 =0; 65-74 = 1,  >75 =2; stroke/embolism= 2).   Recommendations:   KROSS SWALLOWS is a 89 y.o.  Caucasian male with permanent atrial fibrillation, obstructive sleep apnea on CPAP and compliant, hypertension, diabetes mellitus with stage IIIa chronic kidney disease, mild centrilobular emphysema,  presents here for [facility] visit for type II MI associated with severe GI bleed and admitted to the hospital on 07/06/2022 requiring 4 units of packed RBC  transfusion, hypertension and atrial fibrillation follow-up.  Patient has been scheduled for Endoscopic retrograde cholangiopancreatography Dr. Meridee Score doing the procedure scheduled on 08/20/2022.  1. Permanent atrial fibrillation [facility]) Patient with permanent atrial fibrillation, he is rate controlled.  Would not recommend restarting any anticoagulation in view of advanced age, underlying chronic renal insufficiency, recurrent GI bleed and severe anemia and thrombocytopenia.  I discussed with his daughter over the phone Ms. [name] regarding the risk of bleeding versus risk of stroke.  She is aware of this. - EKG 12-Lead  2. H/O: upper GI bleed I evaluated the hospital records, I reconciled his medications, make sure that he is not on anticoagulants.  I also sent a message to the assisted living facility not to start him on any aspirin.  3. Primary hypertension Blood pressure is well-controlled.  4. Stage 3a chronic kidney disease (HCC) Stage III chronic kidney disease has remained stable.  In view of this along with low platelet count, advanced age, he is probably auto anticoagulated at least to some degree.  Irrespective, he has got extreme high risk for recurrence  of GI bleed and life-threatening anemia.  He is now 89 years of age, do not think that proceeding with left atrial appendage closure would be appropriate at this time at least at this juncture as he will not be able to tolerate any kind of anticoagulation even for short-term.  I will see him back in 6 months for follow-up.   [name], MD, [facility] 07/22/2022, 11:31 AM Office: [phone]

## 2022-07-23 ENCOUNTER — Inpatient Hospital Stay: Payer: PPO

## 2022-07-23 ENCOUNTER — Other Ambulatory Visit: Payer: Self-pay

## 2022-07-23 VITALS — BP 108/51 | HR 56 | Temp 97.8°F | Resp 16

## 2022-07-23 DIAGNOSIS — C241 Malignant neoplasm of ampulla of Vater: Secondary | ICD-10-CM | POA: Diagnosis not present

## 2022-07-23 DIAGNOSIS — D649 Anemia, unspecified: Secondary | ICD-10-CM

## 2022-07-23 DIAGNOSIS — D519 Vitamin B12 deficiency anemia, unspecified: Secondary | ICD-10-CM | POA: Diagnosis not present

## 2022-07-23 LAB — CBC WITH DIFFERENTIAL/PLATELET
Abs Immature Granulocytes: 0.01 10*3/uL (ref 0.00–0.07)
Basophils Absolute: 0 10*3/uL (ref 0.0–0.1)
Basophils Relative: 1 %
Eosinophils Absolute: 0.2 10*3/uL (ref 0.0–0.5)
Eosinophils Relative: 5 %
HCT: 24.1 % — ABNORMAL LOW (ref 39.0–52.0)
Hemoglobin: 7.7 g/dL — ABNORMAL LOW (ref 13.0–17.0)
Immature Granulocytes: 0 %
Lymphocytes Relative: 27 %
Lymphs Abs: 1.3 10*3/uL (ref 0.7–4.0)
MCH: 35 pg — ABNORMAL HIGH (ref 26.0–34.0)
MCHC: 32 g/dL (ref 30.0–36.0)
MCV: 109.5 fL — ABNORMAL HIGH (ref 80.0–100.0)
Monocytes Absolute: 0.4 10*3/uL (ref 0.1–1.0)
Monocytes Relative: 9 %
Neutro Abs: 2.8 10*3/uL (ref 1.7–7.7)
Neutrophils Relative %: 58 %
Platelets: 184 10*3/uL (ref 150–400)
RBC: 2.2 MIL/uL — ABNORMAL LOW (ref 4.22–5.81)
RDW: 25.7 % — ABNORMAL HIGH (ref 11.5–15.5)
WBC: 4.7 10*3/uL (ref 4.0–10.5)
nRBC: 0 % (ref 0.0–0.2)

## 2022-07-23 LAB — COMPREHENSIVE METABOLIC PANEL
ALT: 20 U/L (ref 0–44)
AST: 26 U/L (ref 15–41)
Albumin: 2.9 g/dL — ABNORMAL LOW (ref 3.5–5.0)
Alkaline Phosphatase: 71 U/L (ref 38–126)
Anion gap: 9 (ref 5–15)
BUN: 20 mg/dL (ref 8–23)
CO2: 25 mmol/L (ref 22–32)
Calcium: 8.6 mg/dL — ABNORMAL LOW (ref 8.9–10.3)
Chloride: 109 mmol/L (ref 98–111)
Creatinine, Ser: 1.36 mg/dL — ABNORMAL HIGH (ref 0.61–1.24)
GFR, Estimated: 50 mL/min — ABNORMAL LOW (ref >60)
Glucose, Bld: 87 mg/dL (ref 70–99)
Potassium: 2.9 mmol/L — ABNORMAL LOW (ref 3.5–5.1)
Sodium: 143 mmol/L (ref 135–145)
Total Bilirubin: 0.4 mg/dL (ref 0.3–1.2)
Total Protein: 6.3 g/dL — ABNORMAL LOW (ref 6.5–8.1)

## 2022-07-23 LAB — SAMPLE TO BLOOD BANK

## 2022-07-23 LAB — FERRITIN: Ferritin: 171 ng/mL (ref 24–336)

## 2022-07-23 MED ORDER — CYANOCOBALAMIN 1000 MCG/ML IJ SOLN
1000.0000 ug | Freq: Once | INTRAMUSCULAR | Status: AC
  Administered 2022-07-23: 1000 ug via INTRAMUSCULAR
  Filled 2022-07-23: qty 1

## 2022-07-24 LAB — CANCER ANTIGEN 19-9: CA 19-9: 36 U/mL — ABNORMAL HIGH (ref 0–35)

## 2022-07-28 ENCOUNTER — Telehealth: Payer: Self-pay

## 2022-07-28 NOTE — Telephone Encounter (Signed)
-----   Message from Malachy Mood, MD sent at 07/27/2022  3:17 PM EDT ----- Please let pt know his Hg was low last week, if he is symptomatic, please schedule lab (including type and cross) and 1u blood for him this week. Please call in oral KCL twice daily for one monht for him and arrange iv K is his repeated K still below 3. thanks   Malachy Mood

## 2022-07-28 NOTE — Telephone Encounter (Signed)
Tried to call patient multiple times on both numbers with no answer. I left a message at both numbers to call us back as soon as he could.    ----- Message from Malachy Mood, MD sent at 07/27/2022  3:17 PM EDT ----- Please let pt know his Hg was low last week, if he is symptomatic, please schedule lab (including type and cross) and 1u blood for him this week. Please call in oral KCL twice daily for one monht for him and arrange iv K is his repeated K still below 3. thanks    Malachy Mood

## 2022-07-30 ENCOUNTER — Other Ambulatory Visit: Payer: Self-pay

## 2022-07-30 ENCOUNTER — Telehealth: Payer: Self-pay

## 2022-07-30 DIAGNOSIS — D519 Vitamin B12 deficiency anemia, unspecified: Secondary | ICD-10-CM

## 2022-07-30 DIAGNOSIS — C241 Malignant neoplasm of ampulla of Vater: Secondary | ICD-10-CM

## 2022-07-30 MED ORDER — POTASSIUM CHLORIDE CRYS ER 20 MEQ PO TBCR: 20.0000 meq | EXTENDED_RELEASE_TABLET | Freq: Two times a day (BID) | ORAL | 0 refills | Status: DC

## 2022-07-30 NOTE — Telephone Encounter (Signed)
Returned call to patient about setting up a lab appointment for this week. He stated he can't do it Friday he has another appointment. So I scheduled him for lunch time on Monday to have the labs drawn at 1200. Patient voiced understanding. Also called in his Potassium to be picked up.

## 2022-08-01 ENCOUNTER — Inpatient Hospital Stay: Payer: PPO

## 2022-08-01 ENCOUNTER — Encounter (HOSPITAL_COMMUNITY): Payer: Self-pay

## 2022-08-01 ENCOUNTER — Inpatient Hospital Stay (HOSPITAL_COMMUNITY)
Admission: EM | Admit: 2022-08-01 | Discharge: 2022-08-08 | DRG: 377 | Disposition: A | Discharge status: Skilled Nursing Facility | Payer: PPO | Attending: Internal Medicine | Admitting: Internal Medicine

## 2022-08-01 ENCOUNTER — Telehealth: Payer: Self-pay

## 2022-08-01 ENCOUNTER — Other Ambulatory Visit: Payer: Self-pay

## 2022-08-01 ENCOUNTER — Inpatient Hospital Stay: Payer: PPO | Attending: Physician Assistant

## 2022-08-01 ENCOUNTER — Emergency Department (HOSPITAL_COMMUNITY): Payer: PPO

## 2022-08-01 DIAGNOSIS — R600 Localized edema: Secondary | ICD-10-CM | POA: Diagnosis not present

## 2022-08-01 DIAGNOSIS — E611 Iron deficiency: Secondary | ICD-10-CM | POA: Diagnosis not present

## 2022-08-01 DIAGNOSIS — D509 Iron deficiency anemia, unspecified: Secondary | ICD-10-CM | POA: Diagnosis present

## 2022-08-01 DIAGNOSIS — E039 Hypothyroidism, unspecified: Secondary | ICD-10-CM | POA: Diagnosis not present

## 2022-08-01 DIAGNOSIS — M1990 Unspecified osteoarthritis, unspecified site: Secondary | ICD-10-CM | POA: Diagnosis present

## 2022-08-01 DIAGNOSIS — K921 Melena: Secondary | ICD-10-CM | POA: Diagnosis not present

## 2022-08-01 DIAGNOSIS — K760 Fatty (change of) liver, not elsewhere classified: Secondary | ICD-10-CM | POA: Diagnosis present

## 2022-08-01 DIAGNOSIS — D519 Vitamin B12 deficiency anemia, unspecified: Secondary | ICD-10-CM | POA: Diagnosis not present

## 2022-08-01 DIAGNOSIS — K31819 Angiodysplasia of stomach and duodenum without bleeding: Secondary | ICD-10-CM

## 2022-08-01 DIAGNOSIS — D62 Acute posthemorrhagic anemia: Secondary | ICD-10-CM | POA: Diagnosis present

## 2022-08-01 DIAGNOSIS — K254 Chronic or unspecified gastric ulcer with hemorrhage: Secondary | ICD-10-CM | POA: Diagnosis not present

## 2022-08-01 DIAGNOSIS — Z79890 Hormone replacement therapy (postmenopausal): Secondary | ICD-10-CM

## 2022-08-01 DIAGNOSIS — K3189 Other diseases of stomach and duodenum: Secondary | ICD-10-CM | POA: Diagnosis not present

## 2022-08-01 DIAGNOSIS — E46 Unspecified protein-calorie malnutrition: Secondary | ICD-10-CM | POA: Diagnosis not present

## 2022-08-01 DIAGNOSIS — E876 Hypokalemia: Secondary | ICD-10-CM | POA: Diagnosis not present

## 2022-08-01 DIAGNOSIS — I129 Hypertensive chronic kidney disease with stage 1 through stage 4 chronic kidney disease, or unspecified chronic kidney disease: Secondary | ICD-10-CM | POA: Diagnosis not present

## 2022-08-01 DIAGNOSIS — Z515 Encounter for palliative care: Secondary | ICD-10-CM

## 2022-08-01 DIAGNOSIS — I959 Hypotension, unspecified: Secondary | ICD-10-CM | POA: Diagnosis not present

## 2022-08-01 DIAGNOSIS — D649 Anemia, unspecified: Secondary | ICD-10-CM | POA: Diagnosis present

## 2022-08-01 DIAGNOSIS — R001 Bradycardia, unspecified: Secondary | ICD-10-CM | POA: Diagnosis not present

## 2022-08-01 DIAGNOSIS — D696 Thrombocytopenia, unspecified: Secondary | ICD-10-CM | POA: Diagnosis present

## 2022-08-01 DIAGNOSIS — K922 Gastrointestinal hemorrhage, unspecified: Principal | ICD-10-CM | POA: Diagnosis present

## 2022-08-01 DIAGNOSIS — K831 Obstruction of bile duct: Secondary | ICD-10-CM | POA: Diagnosis present

## 2022-08-01 DIAGNOSIS — K5520 Angiodysplasia of colon without hemorrhage: Secondary | ICD-10-CM | POA: Diagnosis not present

## 2022-08-01 DIAGNOSIS — R2681 Unsteadiness on feet: Secondary | ICD-10-CM | POA: Diagnosis not present

## 2022-08-01 DIAGNOSIS — Z87891 Personal history of nicotine dependence: Secondary | ICD-10-CM | POA: Diagnosis not present

## 2022-08-01 DIAGNOSIS — I472 Ventricular tachycardia, unspecified: Secondary | ICD-10-CM | POA: Diagnosis not present

## 2022-08-01 DIAGNOSIS — I5031 Acute diastolic (congestive) heart failure: Secondary | ICD-10-CM | POA: Diagnosis not present

## 2022-08-01 DIAGNOSIS — Z471 Aftercare following joint replacement surgery: Secondary | ICD-10-CM | POA: Diagnosis not present

## 2022-08-01 DIAGNOSIS — Z8041 Family history of malignant neoplasm of ovary: Secondary | ICD-10-CM

## 2022-08-01 DIAGNOSIS — C241 Malignant neoplasm of ampulla of Vater: Secondary | ICD-10-CM | POA: Diagnosis present

## 2022-08-01 DIAGNOSIS — G9340 Encephalopathy, unspecified: Secondary | ICD-10-CM

## 2022-08-01 DIAGNOSIS — I5032 Chronic diastolic (congestive) heart failure: Secondary | ICD-10-CM | POA: Diagnosis not present

## 2022-08-01 DIAGNOSIS — Z7189 Other specified counseling: Secondary | ICD-10-CM | POA: Diagnosis not present

## 2022-08-01 DIAGNOSIS — Y842 Radiological procedure and radiotherapy as the cause of abnormal reaction of the patient, or of later complication, without mention of misadventure at the time of the procedure: Secondary | ICD-10-CM | POA: Diagnosis present

## 2022-08-01 DIAGNOSIS — I2510 Atherosclerotic heart disease of native coronary artery without angina pectoris: Secondary | ICD-10-CM | POA: Diagnosis not present

## 2022-08-01 DIAGNOSIS — R531 Weakness: Secondary | ICD-10-CM | POA: Diagnosis not present

## 2022-08-01 DIAGNOSIS — I480 Paroxysmal atrial fibrillation: Secondary | ICD-10-CM | POA: Diagnosis present

## 2022-08-01 DIAGNOSIS — E669 Obesity, unspecified: Secondary | ICD-10-CM | POA: Diagnosis present

## 2022-08-01 DIAGNOSIS — K5521 Angiodysplasia of colon with hemorrhage: Secondary | ICD-10-CM

## 2022-08-01 DIAGNOSIS — J90 Pleural effusion, not elsewhere classified: Secondary | ICD-10-CM | POA: Diagnosis not present

## 2022-08-01 DIAGNOSIS — I4891 Unspecified atrial fibrillation: Secondary | ICD-10-CM | POA: Diagnosis present

## 2022-08-01 DIAGNOSIS — I130 Hypertensive heart and chronic kidney disease with heart failure and stage 1 through stage 4 chronic kidney disease, or unspecified chronic kidney disease: Secondary | ICD-10-CM | POA: Diagnosis not present

## 2022-08-01 DIAGNOSIS — K2270 Barrett's esophagus without dysplasia: Secondary | ICD-10-CM | POA: Diagnosis present

## 2022-08-01 DIAGNOSIS — N179 Acute kidney failure, unspecified: Secondary | ICD-10-CM | POA: Diagnosis not present

## 2022-08-01 DIAGNOSIS — T50995A Adverse effect of other drugs, medicaments and biological substances, initial encounter: Secondary | ICD-10-CM | POA: Diagnosis present

## 2022-08-01 DIAGNOSIS — K2970 Gastritis, unspecified, without bleeding: Secondary | ICD-10-CM | POA: Diagnosis present

## 2022-08-01 DIAGNOSIS — N1832 Chronic kidney disease, stage 3b: Secondary | ICD-10-CM | POA: Diagnosis present

## 2022-08-01 DIAGNOSIS — G4733 Obstructive sleep apnea (adult) (pediatric): Secondary | ICD-10-CM | POA: Diagnosis present

## 2022-08-01 DIAGNOSIS — K31811 Angiodysplasia of stomach and duodenum with bleeding: Principal | ICD-10-CM | POA: Diagnosis present

## 2022-08-01 DIAGNOSIS — Z86010 Personal history of colonic polyps: Secondary | ICD-10-CM

## 2022-08-01 DIAGNOSIS — R4182 Altered mental status, unspecified: Secondary | ICD-10-CM | POA: Diagnosis not present

## 2022-08-01 DIAGNOSIS — Z7901 Long term (current) use of anticoagulants: Secondary | ICD-10-CM | POA: Diagnosis not present

## 2022-08-01 DIAGNOSIS — Z4682 Encounter for fitting and adjustment of non-vascular catheter: Secondary | ICD-10-CM | POA: Diagnosis not present

## 2022-08-01 DIAGNOSIS — N400 Enlarged prostate without lower urinary tract symptoms: Secondary | ICD-10-CM | POA: Diagnosis present

## 2022-08-01 DIAGNOSIS — R040 Epistaxis: Secondary | ICD-10-CM | POA: Diagnosis not present

## 2022-08-01 DIAGNOSIS — K8309 Other cholangitis: Secondary | ICD-10-CM | POA: Diagnosis not present

## 2022-08-01 DIAGNOSIS — R54 Age-related physical debility: Secondary | ICD-10-CM | POA: Diagnosis present

## 2022-08-01 DIAGNOSIS — K219 Gastro-esophageal reflux disease without esophagitis: Secondary | ICD-10-CM | POA: Diagnosis present

## 2022-08-01 DIAGNOSIS — D500 Iron deficiency anemia secondary to blood loss (chronic): Secondary | ICD-10-CM | POA: Diagnosis not present

## 2022-08-01 DIAGNOSIS — Z6832 Body mass index (BMI) 32.0-32.9, adult: Secondary | ICD-10-CM

## 2022-08-01 DIAGNOSIS — E860 Dehydration: Secondary | ICD-10-CM | POA: Diagnosis not present

## 2022-08-01 DIAGNOSIS — Z66 Do not resuscitate: Secondary | ICD-10-CM

## 2022-08-01 DIAGNOSIS — R4589 Other symptoms and signs involving emotional state: Secondary | ICD-10-CM | POA: Diagnosis not present

## 2022-08-01 DIAGNOSIS — Z923 Personal history of irradiation: Secondary | ICD-10-CM

## 2022-08-01 DIAGNOSIS — I214 Non-ST elevation (NSTEMI) myocardial infarction: Secondary | ICD-10-CM | POA: Diagnosis not present

## 2022-08-01 DIAGNOSIS — Z886 Allergy status to analgesic agent status: Secondary | ICD-10-CM

## 2022-08-01 DIAGNOSIS — K7460 Unspecified cirrhosis of liver: Secondary | ICD-10-CM | POA: Diagnosis present

## 2022-08-01 DIAGNOSIS — G9341 Metabolic encephalopathy: Secondary | ICD-10-CM | POA: Diagnosis present

## 2022-08-01 DIAGNOSIS — Z8249 Family history of ischemic heart disease and other diseases of the circulatory system: Secondary | ICD-10-CM

## 2022-08-01 DIAGNOSIS — Z85828 Personal history of other malignant neoplasm of skin: Secondary | ICD-10-CM

## 2022-08-01 DIAGNOSIS — Z79899 Other long term (current) drug therapy: Secondary | ICD-10-CM

## 2022-08-01 DIAGNOSIS — I252 Old myocardial infarction: Secondary | ICD-10-CM | POA: Diagnosis not present

## 2022-08-01 DIAGNOSIS — K259 Gastric ulcer, unspecified as acute or chronic, without hemorrhage or perforation: Secondary | ICD-10-CM | POA: Diagnosis not present

## 2022-08-01 DIAGNOSIS — Z7401 Bed confinement status: Secondary | ICD-10-CM | POA: Diagnosis not present

## 2022-08-01 DIAGNOSIS — I110 Hypertensive heart disease with heart failure: Secondary | ICD-10-CM | POA: Diagnosis not present

## 2022-08-01 DIAGNOSIS — E8770 Fluid overload, unspecified: Secondary | ICD-10-CM | POA: Diagnosis present

## 2022-08-01 DIAGNOSIS — N183 Chronic kidney disease, stage 3 unspecified: Secondary | ICD-10-CM | POA: Diagnosis not present

## 2022-08-01 DIAGNOSIS — I10 Essential (primary) hypertension: Secondary | ICD-10-CM | POA: Diagnosis not present

## 2022-08-01 DIAGNOSIS — I4821 Permanent atrial fibrillation: Secondary | ICD-10-CM | POA: Diagnosis not present

## 2022-08-01 LAB — BLOOD GAS, ARTERIAL
Acid-base deficit: 0.9 mmol/L (ref 0.0–2.0)
Bicarbonate: 21.3 mmol/L (ref 20.0–28.0)
Drawn by: 56037
O2 Saturation: 100 %
Patient temperature: 36.7
pCO2 arterial: 28 mmHg — ABNORMAL LOW (ref 32–48)
pH, Arterial: 7.49 — ABNORMAL HIGH (ref 7.35–7.45)
pO2, Arterial: 111 mmHg — ABNORMAL HIGH (ref 83–108)

## 2022-08-01 LAB — TYPE AND SCREEN
ABO/RH(D): A NEG
Antibody Screen: NEGATIVE

## 2022-08-01 LAB — PREPARE RBC (CROSSMATCH)

## 2022-08-01 LAB — CBC WITH DIFFERENTIAL/PLATELET
Abs Immature Granulocytes: 0.03 10*3/uL (ref 0.00–0.07)
Basophils Absolute: 0 10*3/uL (ref 0.0–0.1)
Basophils Relative: 0 %
Eosinophils Absolute: 0.1 10*3/uL (ref 0.0–0.5)
Eosinophils Relative: 1 %
HCT: 16.4 % — ABNORMAL LOW (ref 39.0–52.0)
Hemoglobin: 5 g/dL — CL (ref 13.0–17.0)
Immature Granulocytes: 1 %
Lymphocytes Relative: 19 %
Lymphs Abs: 1.1 10*3/uL (ref 0.7–4.0)
MCH: 35.7 pg — ABNORMAL HIGH (ref 26.0–34.0)
MCHC: 30.5 g/dL (ref 30.0–36.0)
MCV: 117.1 fL — ABNORMAL HIGH (ref 80.0–100.0)
Monocytes Absolute: 0.6 10*3/uL (ref 0.1–1.0)
Monocytes Relative: 11 %
Neutro Abs: 3.8 10*3/uL (ref 1.7–7.7)
Neutrophils Relative %: 68 %
Platelets: 137 10*3/uL — ABNORMAL LOW (ref 150–400)
RBC: 1.4 MIL/uL — ABNORMAL LOW (ref 4.22–5.81)
WBC: 5.6 10*3/uL (ref 4.0–10.5)
nRBC: 1.3 % — ABNORMAL HIGH (ref 0.0–0.2)

## 2022-08-01 LAB — CMP (CANCER CENTER ONLY)
ALT: 15 U/L (ref 0–44)
AST: 28 U/L (ref 15–41)
Albumin: 3 g/dL — ABNORMAL LOW (ref 3.5–5.0)
Alkaline Phosphatase: 68 U/L (ref 38–126)
Anion gap: 11 (ref 5–15)
BUN: 30 mg/dL — ABNORMAL HIGH (ref 8–23)
CO2: 21 mmol/L — ABNORMAL LOW (ref 22–32)
Calcium: 8.7 mg/dL — ABNORMAL LOW (ref 8.9–10.3)
Chloride: 113 mmol/L — ABNORMAL HIGH (ref 98–111)
Creatinine: 1.57 mg/dL — ABNORMAL HIGH (ref 0.61–1.24)
GFR, Estimated: 42 mL/min — ABNORMAL LOW (ref >60)
Glucose, Bld: 128 mg/dL — ABNORMAL HIGH (ref 70–99)
Potassium: 4.2 mmol/L (ref 3.5–5.1)
Sodium: 145 mmol/L (ref 135–145)
Total Bilirubin: 0.7 mg/dL (ref 0.3–1.2)
Total Protein: 5.4 g/dL — ABNORMAL LOW (ref 6.5–8.1)

## 2022-08-01 LAB — BASIC METABOLIC PANEL
Anion gap: 8 (ref 5–15)
BUN: 32 mg/dL — ABNORMAL HIGH (ref 8–23)
CO2: 21 mmol/L — ABNORMAL LOW (ref 22–32)
Calcium: 8.3 mg/dL — ABNORMAL LOW (ref 8.9–10.3)
Chloride: 114 mmol/L — ABNORMAL HIGH (ref 98–111)
Creatinine, Ser: 1.53 mg/dL — ABNORMAL HIGH (ref 0.61–1.24)
GFR, Estimated: 43 mL/min — ABNORMAL LOW (ref >60)
Glucose, Bld: 135 mg/dL — ABNORMAL HIGH (ref 70–99)
Potassium: 4.1 mmol/L (ref 3.5–5.1)
Sodium: 143 mmol/L (ref 135–145)

## 2022-08-01 LAB — PROTIME-INR
INR: 1.2 (ref 0.8–1.2)
Prothrombin Time: 15 seconds (ref 11.4–15.2)

## 2022-08-01 LAB — URINALYSIS, W/ REFLEX TO CULTURE (INFECTION SUSPECTED)
Bacteria, UA: NONE SEEN
Bilirubin Urine: NEGATIVE
Glucose, UA: 150 mg/dL — AB
Hgb urine dipstick: NEGATIVE
Ketones, ur: NEGATIVE mg/dL
Leukocytes,Ua: NEGATIVE
Nitrite: NEGATIVE
Protein, ur: NEGATIVE mg/dL
Specific Gravity, Urine: 1.009 (ref 1.005–1.030)
pH: 5 (ref 5.0–8.0)

## 2022-08-01 LAB — TROPONIN I (HIGH SENSITIVITY)
Troponin I (High Sensitivity): 51 ng/L — ABNORMAL HIGH (ref <18)
Troponin I (High Sensitivity): 58 ng/L — ABNORMAL HIGH (ref <18)

## 2022-08-01 LAB — FERRITIN: Ferritin: 105 ng/mL (ref 24–336)

## 2022-08-01 LAB — BPAM RBC: Blood Product Expiration Date: 202408062359

## 2022-08-01 LAB — POC OCCULT BLOOD, ED: Fecal Occult Bld: POSITIVE — AB

## 2022-08-01 MED ORDER — PANTOPRAZOLE INFUSION (NEW) - SIMPLE MED
8.0000 mg/h | INTRAVENOUS | Status: AC
  Administered 2022-08-01 – 2022-08-04 (×8): 8 mg/h via INTRAVENOUS
  Filled 2022-08-01 (×8): qty 80

## 2022-08-01 MED ORDER — INSULIN ASPART 100 UNIT/ML IJ SOLN
0.0000 [IU] | Freq: Every day | INTRAMUSCULAR | Status: DC
  Filled 2022-08-01: qty 0.05

## 2022-08-01 MED ORDER — LEVOTHYROXINE SODIUM 112 MCG PO TABS
112.0000 ug | ORAL_TABLET | Freq: Every day | ORAL | Status: DC
  Administered 2022-08-03 – 2022-08-08 (×6): 112 ug via ORAL
  Filled 2022-08-01 (×7): qty 1

## 2022-08-01 MED ORDER — ACETAMINOPHEN 650 MG RE SUPP: 650.0000 mg | Freq: Four times a day (QID) | RECTAL | Status: DC | PRN

## 2022-08-01 MED ORDER — INSULIN ASPART 100 UNIT/ML IJ SOLN
0.0000 [IU] | Freq: Three times a day (TID) | INTRAMUSCULAR | Status: DC
  Administered 2022-08-03 – 2022-08-06 (×2): 1 [IU] via SUBCUTANEOUS
  Filled 2022-08-01: qty 0.09

## 2022-08-01 MED ORDER — SODIUM CHLORIDE 0.9% FLUSH
3.0000 mL | Freq: Two times a day (BID) | INTRAVENOUS | Status: DC
  Administered 2022-08-01 – 2022-08-08 (×12): 3 mL via INTRAVENOUS

## 2022-08-01 MED ORDER — PANTOPRAZOLE SODIUM 40 MG IV SOLR
40.0000 mg | Freq: Two times a day (BID) | INTRAVENOUS | Status: DC
  Administered 2022-08-05 – 2022-08-08 (×7): 40 mg via INTRAVENOUS
  Filled 2022-08-01 (×7): qty 10

## 2022-08-01 MED ORDER — VITAMIN D 25 MCG (1000 UNIT) PO TABS
5000.0000 [IU] | ORAL_TABLET | Freq: Every evening | ORAL | Status: DC
  Administered 2022-08-02 – 2022-08-07 (×6): 5000 [IU] via ORAL
  Filled 2022-08-01 (×6): qty 5

## 2022-08-01 MED ORDER — SODIUM CHLORIDE 0.9% IV SOLUTION: Freq: Once | INTRAVENOUS | Status: AC

## 2022-08-01 MED ORDER — PANTOPRAZOLE 80MG IVPB - SIMPLE MED
80.0000 mg | Freq: Once | INTRAVENOUS | Status: AC
  Administered 2022-08-01: 80 mg via INTRAVENOUS
  Filled 2022-08-01: qty 80

## 2022-08-01 MED ORDER — CHLORHEXIDINE GLUCONATE CLOTH 2 % EX PADS
6.0000 | MEDICATED_PAD | Freq: Every day | CUTANEOUS | Status: DC
  Administered 2022-08-02 – 2022-08-07 (×6): 6 via TOPICAL

## 2022-08-01 MED ORDER — ACETAMINOPHEN 325 MG PO TABS
650.0000 mg | ORAL_TABLET | Freq: Four times a day (QID) | ORAL | Status: DC | PRN
  Filled 2022-08-01: qty 2

## 2022-08-01 MED ORDER — POLYETHYLENE GLYCOL 3350 17 G PO PACK: 17.0000 g | PACK | Freq: Every day | ORAL | Status: DC | PRN

## 2022-08-01 NOTE — Telephone Encounter (Signed)
CRITICAL VALUE STICKER  CRITICAL VALUE: Hgb 5.0  RECEIVER (on-site recipient of call): Duard Greenia CMA  DATE & TIME NOTIFIED: 1400 08/01/2022  MESSENGER (representative from lab): Heather in Lab  MD NOTIFIED: Dr. Mosetta Putt  TIME OF NOTIFICATION: 1400  RESPONSE: Called patient and told them to go to ER as per Infusion suite. Called family to tell them to take him to the ER Carl Woods his daughter stated they were already in the ER to be seen.

## 2022-08-01 NOTE — ED Provider Notes (Signed)
[place] EMERGENCY DEPARTMENT AT [facility] Provider Note   CSN: [id number] Arrival date & time: 08/01/22  1341     History  Chief Complaint  Patient presents with   Altered Mental Status    Carl Woods is a 89 y.o. male.  With a history of iron deficiency anemia, OSA, GERD, arthritis, A-fib, BPH who presents to the ED for evaluation of altered mental status.  Patient was admitted to the hospital and discharged to a rehab facility.  He was then discharged to home on Monday.  Daughter at bedside states that he has gotten progressively more altered since that time.  She states that he has been lethargic for the last 2 days.  Daughter states that over the course of his admission and he received 6 units of whole blood.  She says that he has been very slow to answer questions.  Daughter states that she noticed some dark stool on his underwear this morning. Has not taken his eliquis since 06/26.  Patient reports some fatigue.  He denies any chest pain, shortness of breath, abdominal pain, nausea, vomiting, fevers.   Altered Mental Status Associated symptoms: weakness        Home Medications Prior to Admission medications   Medication Sig Start Date End Date Taking? Authorizing Provider  bumetanide (BUMEX) 1 MG tablet Take 1 tablet (1 mg total) by mouth daily. 07/11/22   Burnadette Pop, MD  Cholecalciferol (VITAMIN D3) 5000 units CAPS Take 1 capsule by mouth every evening.    [provider]  dapagliflozin propanediol (FARXIGA) 10 MG TABS tablet Take 10 mg by mouth in the morning. 05/29/20   [provider]  Fe Fum-Fe Poly-Vit C-Lactobac (FUSION) 65-65-25-30 MG CAPS Take 1 capsule by mouth 3 (three) times a week.    [provider]  fexofenadine (ALLEGRA) 180 MG tablet Take 180 mg by mouth as needed for allergies or rhinitis.    [provider]  Glucosamine Sulfate 500 MG TABS Take 500 mg by mouth daily. 07/08/16   [provider]   Glucosamine-Chondroit-Vit C-Mn (GLUCOSAMINE CHONDR 500 COMPLEX PO) Take 1 tablet by mouth daily.     [provider]  ketoconazole (NIZORAL) 2 % cream Apply 1 Application topically 2 (two) times daily as needed (skin rash/irritation.).    [provider]  levothyroxine (SYNTHROID, LEVOTHROID) 112 MCG tablet Take 112 mcg by mouth daily before breakfast.  02/11/16   [provider]  metoprolol succinate (TOPROL-XL) 25 MG 24 hr tablet Take 0.5 tablets (12.5 mg total) by mouth daily. Take with or immediately following a meal. 07/11/22 01/07/23  Burnadette Pop, MD  nitroGLYCERIN (NITROSTAT) 0.4 MG SL tablet Place 1 tablet (0.4 mg total) under the tongue every 5 (five) minutes as needed for up to 25 days for chest pain. 01/31/20 01/18/24  [name], MD  olopatadine (PATANOL) 0.1 % ophthalmic solution Place 1 drop into both eyes daily as needed for allergies. 07/08/16   [provider]  pantoprazole (PROTONIX) 40 MG tablet Take 1 tablet (40 mg total) by mouth 2 (two) times daily. 07/11/22   Burnadette Pop, MD  potassium chloride SA (KLOR-CON M) 20 MEQ tablet Take 1 tablet (20 mEq total) by mouth 2 (two) times daily. 07/30/22   Malachy Mood, MD  sodium chloride (OCEAN) 0.65 % SOLN nasal spray Place 1 spray into both nostrils as needed for congestion.    [provider]  sucralfate (CARAFATE) 1 GM/10ML suspension Take 10 mLs (1 g  total) by mouth 4 (four) times daily -  with meals and at bedtime. 07/11/22   Burnadette Pop, MD      Allergies    Patient has no known allergies.    Review of Systems   Review of Systems  Constitutional:  Positive for fatigue.  Neurological:  Positive for weakness.  All other systems reviewed and are negative.   Physical Exam Updated Vital Signs BP (!) 91/47   Pulse 78   Temp 98.1 F (36.7 C) (Axillary)   Resp 19   Ht 5\' 8"  (1.727 m)   Wt 92 kg   SpO2 95%   BMI 30.84 kg/m  Physical Exam Vitals and nursing note reviewed. Exam  conducted with a chaperone present Nicola Girt, RN).  Constitutional:      General: He is not in acute distress.    Appearance: He is well-developed.  HENT:     Head: Normocephalic and atraumatic.  Eyes:     Conjunctiva/sclera: Conjunctivae normal.  Cardiovascular:     Rate and Rhythm: Normal rate and regular rhythm.     Heart sounds: No murmur heard. Pulmonary:     Effort: Pulmonary effort is normal. No respiratory distress.     Breath sounds: Normal breath sounds. No wheezing, rhonchi or rales.  Abdominal:     Palpations: Abdomen is soft.     Tenderness: There is no abdominal tenderness.  Genitourinary:    Comments: Melanotic stool appreciated on rectal exam. No rectal masses, stepoffs or deformities Musculoskeletal:        General: No swelling.     Cervical back: Neck supple.     Comments: Bilateral 2+ pitting edema  Skin:    General: Skin is warm and dry.     Capillary Refill: Capillary refill takes less than 2 seconds.     Comments: Diffusely pale skin  Neurological:     Mental Status: He is alert.     Comments: Drowsy, slow to answer questions.  Alert and oriented to self, place and person.  Intermittent confusion.  Will not follow commands for neurologic exam     ED Results / Procedures / Treatments   Labs (all labs ordered are listed, but only abnormal results are displayed) Labs Reviewed  BASIC METABOLIC PANEL - Abnormal; Notable for the following components:      Result Value   Chloride 114 (*)    CO2 21 (*)    Glucose, Bld 135 (*)    BUN 32 (*)    Creatinine, Ser 1.53 (*)    Calcium 8.3 (*)    GFR, Estimated 43 (*)    All other components within normal limits  CBC WITH DIFFERENTIAL/PLATELET - Abnormal; Notable for the following components:   RBC 1.40 (*)    Hemoglobin 5.0 (*)    HCT 16.4 (*)    MCV 117.1 (*)    MCH 35.7 (*)    Platelets 137 (*)    nRBC 1.3 (*)    All other components within normal limits  URINALYSIS, W/ REFLEX TO CULTURE (INFECTION  SUSPECTED) - Abnormal; Notable for the following components:   Glucose, UA 150 (*)    All other components within normal limits  POC OCCULT BLOOD, ED - Abnormal; Notable for the following components:   Fecal Occult Bld POSITIVE (*)    All other components within normal limits  TROPONIN I (HIGH SENSITIVITY) - Abnormal; Notable for the following components:   Troponin I (High Sensitivity) 51 (*)    All other  components within normal limits  TROPONIN I (HIGH SENSITIVITY) - Abnormal; Notable for the following components:   Troponin I (High Sensitivity) 58 (*)    All other components within normal limits  PROTIME-INR  HEPARIN ANTI-XA  BLOOD GAS, ARTERIAL  CBC  CBC  APTT  HEMOGLOBIN A1C  BASIC METABOLIC PANEL  TYPE AND SCREEN  PREPARE RBC (CROSSMATCH)    EKG None  Radiology CT Head Wo Contrast  Result Date: 08/01/2022 CLINICAL DATA:  Altered mental status EXAM: CT HEAD WITHOUT CONTRAST TECHNIQUE: Contiguous axial images were obtained from the base of the skull through the vertex without intravenous contrast. RADIATION DOSE REDUCTION: This exam was performed according to the departmental dose-optimization program which includes automated exposure control, adjustment of the mA and/or kV according to patient size and/or use of iterative reconstruction technique. COMPARISON:  None Available. FINDINGS: Brain: No evidence of acute infarction, hemorrhage, hydrocephalus, extra-axial collection or mass lesion/mass effect. Vascular: No hyperdense vessel or unexpected calcification. Skull: Normal. Negative for fracture or focal lesion. Sinuses/Orbits: No acute finding. Other: None. IMPRESSION: No acute abnormality noted. Electronically Signed   By: Alcide Clever M.D.   On: 08/01/2022 16:15    Procedures .Critical Care  Performed by: Michelle Piper, PA-C Authorized by: Michelle Piper, PA-C   Critical care provider statement:    Critical care time (minutes):  55   Critical care was  necessary to treat or prevent imminent or life-threatening deterioration of the following conditions:  Circulatory failure   Critical care was time spent personally by me on the following activities:  Development of treatment plan with patient or surrogate, discussions with consultants, evaluation of patient's response to treatment, examination of patient, ordering and review of laboratory studies, ordering and review of radiographic studies, ordering and performing treatments and interventions, pulse oximetry, re-evaluation of patient's condition, review of old charts and obtaining history from patient or surrogate   Care discussed with: admitting provider   Comments:     Acute GI bleed anemia, hemoglobin of 5.0, requiring 2 units of blood     Medications Ordered in ED Medications  pantoprozole (PROTONIX) 80 mg /NS 100 mL infusion (8 mg/hr Intravenous New Bag/Given 08/01/22 2135)  pantoprazole (PROTONIX) injection 40 mg (has no administration in time range)  levothyroxine (SYNTHROID) tablet 112 mcg (has no administration in time range)  Vitamin D3 CAPS 5,000 Units (has no administration in time range)  acetaminophen (TYLENOL) tablet 650 mg (has no administration in time range)    Or  acetaminophen (TYLENOL) suppository 650 mg (has no administration in time range)  polyethylene glycol (MIRALAX / GLYCOLAX) packet 17 g (has no administration in time range)  sodium chloride flush (NS) 0.9 % injection 3 mL (has no administration in time range)  insulin aspart (novoLOG) injection 0-9 Units (has no administration in time range)  insulin aspart (novoLOG) injection 0-5 Units (has no administration in time range)  0.9 %  sodium chloride infusion (Manually program via Guardrails IV Fluids) ( Intravenous New Bag/Given 08/01/22 2023)  pantoprazole (PROTONIX) 80 mg /NS 100 mL IVPB (80 mg Intravenous New Bag/Given 08/01/22 2109)    ED Course/ Medical Decision Making/ A&P Clinical Course as of 08/01/22 2138   Fri Aug 01, 2022  1749 Spoke with son, Carl Woods, Montez Hageman., who is the patient's medical POA, regarding findings and plan.  Son is in agreement. [AS]  2009 Spoke with [facility] GI doctor Danis.  He recommends Protonix bolus and drip, admit to hospitalist.  Would likely need  at least 1 additional unit of whole blood and will need a scope tomorrow. [AS]  2010 Spoke with hospitalist Dr. Maryjean Ka.  He will admit. [AS]    Clinical Course User Index [AS] Vasti Yagi, Edsel Petrin, PA-C                             Medical Decision Making Amount and/or Complexity of Data Reviewed Labs: ordered. Radiology: ordered.  Risk Prescription drug management. Decision regarding hospitalization.  This patient presents to the ED for concern of altered mental status, pallor, this involves an extensive number of treatment options, and is a complaint that carries with it a high risk of complications and morbidity.  The differential diagnosis for AMS is extensive and includes, but is not limited to: drug overdose - opioids, alcohol, sedatives, antipsychotics, drug withdrawal, others; Metabolic: hypoxia, hypoglycemia, hyperglycemia, hypercalcemia, hypernatremia, hyponatremia, uremia, hepatic encephalopathy, hypothyroidism, hyperthyroidism, vitamin B12 or thiamine deficiency, carbon monoxide poisoning, Wilson's disease, Lactic acidosis, DKA/HHOS; Infectious: meningitis, encephalitis, bacteremia/sepsis, urinary tract infection, pneumonia, neurosyphilis; Structural: Space-occupying lesion, (brain tumor, subdural hematoma, hydrocephalus,); Vascular: stroke, subarachnoid hemorrhage, coronary ischemia, hypertensive encephalopathy, CNS vasculitis, thrombotic thrombocytopenic purpura, disseminated intravascular coagulation, hyperviscosity; Psychiatric: Schizophrenia, depression; Other: Seizure, hypothermia, heat stroke, ICU psychosis, dementia -"sundowning."   Co morbidities that complicate the patient evaluation   iron deficiency anemia,  OSA, GERD, arthritis, A-fib, BPH  My initial workup includes labs, imaging, EKG  Additional history obtained from: Nursing notes from this visit. Previous records within EMR system hospital admission on 07/06/2022.  He did symptomatic anemia due to GI received 4 units of blood for his hospitalization.  EGD revealed single ulcer with a visible vessel that was clipped.  Plan for ERCP as an outpatient.  Was started on PPI. Family daughter provides the majority of the history   I ordered, reviewed and interpreted labs which include: BMP, CBC, INR, troponin, type and screen, urinalysis, Hemoccult.  Hemoglobin of 5.0.  Platelets of 137.  Creatinine chronically elevated and stable at 1.53 with a BUN of 32.  Hyperglycemia of 135.  Mild NAGMA with a bicarb of 21.  Likely secondary to CKD.  I ordered imaging studies including CT head I independently visualized and interpreted imaging which showed normal I agree with the radiologist interpretation  Cardiac Monitoring:  The patient was maintained on a cardiac monitor.  I personally viewed and interpreted the cardiac monitored which showed an underlying rhythm of: NSR  Consultations Obtained:  I requested consultation with the GI Dr. Myrtie Neither,  and discussed lab and imaging findings as well as pertinent plan - they recommend: Protonix bolus and infusion, likely will need an additional unit of whole blood on top of the tube that were ordered in the ED, plan to scope him tomorrow.  Afebrile, stable soft blood pressures of approximately 105/50 with an average MAP of 65.  Otherwise hemodynamically stable.  89 year old male presents to the ED for evaluation of weakness, altered mental status, pallor.  He has been progressively worsening since he was discharged from his rehab facility on Monday.  His last dose of Eliquis was on 07/23/2022.  Rectal exam is consistent with melena.  He did have dark black stool.  This was Hemoccult positive.  He denies any other  symptoms.  He is very drowsy and falls asleep numerous times during my evaluation.  When he does stay awake he answers questions appropriately.  Overall suspect his fatigue is secondary to his acute blood loss  anemia.  He has a soft nontender abdomen.  He does not complain of any vomiting or nausea.  No chest pain or shortness of breath.  2 units of whole blood was ordered in the emergency department.  GI believes he will likely need at least 1 more unit for full stabilization.  He has not had any anticoagulation recently and so do not believe there is a need for a reversal agent at this time.  Patient's case discussed with Dr. Doran Durand who agrees with plan.   Note: Portions of this report may have been transcribed using voice recognition software. Every effort was made to ensure accuracy; however, inadvertent computerized transcription errors may still be present.        Final Clinical Impression(s) / ED Diagnoses Final diagnoses:  Gastrointestinal hemorrhage, unspecified gastrointestinal hemorrhage type  Symptomatic anemia  Encephalopathy acute    Rx / DC Orders ED Discharge Orders     None         Mora Bellman 08/01/22 2139    Benjiman Core, MD 08/02/22 [phone]

## 2022-08-01 NOTE — ED Notes (Signed)
ED TO INPATIENT HANDOFF REPORT  ED Nurse Name and Phone #: Claiborne Rigg Name/Age/Gender Carl Woods 89 y.o. male Room/Bed: WA05/WA05  Code Status   Code Status: Full Code  Home/SNF/Other Home/Possible Rehab Patient oriented to: self/difficult to assess, pt is fatigued/weak/somnolent Is this baseline? No   Triage Complete: Triage complete  Chief Complaint GI bleeding [K92.2]  Triage Note Patient BIB daughter who reports AMS. Patient was discharged from Rehab Monday to live at home by himself. On Thursday family noticed he was more confused, weak, and drowsy. Patient was recently admitted for GI bleed and low hemoglobin. Had labs drawn at the cancer center today. No results have come back.  On arrival patient is falling asleep in the wheelchair. A&Ox3 and delayed responses. No extremity weakness or facial droop. Difficult pivot to stretcher.    Allergies No Known Allergies  Level of Care/Admitting Diagnosis ED Disposition     ED Disposition  Admit   Condition  --   Comment  Hospital Area: [facility] [[id number]]  Level of Care: Stepdown [14]  Admit to SDU based on following criteria: Hemodynamic compromise or significant risk of instability:  Patient requiring short term acute titration and management of vasoactive drips, and invasive monitoring (i.e., CVP and Arterial line).  May admit patient to [facility] or [facility] if equivalent level of care is available:: Yes  Covid Evaluation: Asymptomatic - no recent exposure (last 10 days) testing not required  Diagnosis: GI bleeding [196340]  Admitting Physician: Nolberto Hanlon [[id number]]  Attending Physician: Nolberto Hanlon [[id number]]  Certification:: I certify this patient will need inpatient services for at least 2 midnights  Estimated Length of Stay: 3          B Medical/Surgery History Past Medical History:  Diagnosis Date   Anal fistula    Arthritis    Fingers and hands   Atrial  fibrillation (HCC)    AVM (arteriovenous malformation)    Clipped during Colonoscopy 05/2016   Barrett's esophagus    Bilateral cataracts    BPH (benign prostatic hyperplasia)    Cecal angiodysplasia 05/28/2016   ablated at colonoscopy   Deviated nasal septum    Diverticulosis of sigmoid colon    E. 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   Left Forehead (treatment after biopsy)   OSA on CPAP    Perianal rash    Recurrent epistaxis    Renal cyst 01/04/2013   Small left peripelvic renal cysts , noted on US Renal   SCCA (squamous cell carcinoma) of skin 10/17/2015   Left Sup Bridge of Nose (curet, cautery and 5FU)   Seasonal allergies    Superficial basal cell carcinoma (BCC) 10/17/2015   Left Bulb of Nose (curet, cautery and 5FU)   Tubular adenoma    Past Surgical History:  Procedure Laterality Date   BILIARY STENT PLACEMENT N/A 01/23/2022   Procedure: BILIARY STENT PLACEMENT;  Surgeon: Lemar Lofty., MD;  Location: [facility];  Service: Gastroenterology;  Laterality: N/A;   BIOPSY  01/23/2022   Procedure: BIOPSY;  Surgeon: Meridee Score Netty Starring., MD;  Location: [facility];  Service: Gastroenterology;;   BIOPSY  07/09/2022   Procedure: BIOPSY;  Surgeon: Shellia Cleverly, DO;  Location: [facility];  Service: Gastroenterology;;   COLONOSCOPY  multiple  CYSTOSCOPY     ENDOSCOPIC RETROGRADE CHOLANGIOPANCREATOGRAPHY (ERCP) WITH PROPOFOL N/A 01/23/2022   Procedure: ENDOSCOPIC RETROGRADE CHOLANGIOPANCREATOGRAPHY (ERCP) WITH PROPOFOL;  Surgeon: Meridee Score Netty Starring., MD;  Location: [facility];  Service: Gastroenterology;  Laterality: N/A;   ESOPHAGOGASTRODUODENOSCOPY  multiple   ESOPHAGOGASTRODUODENOSCOPY N/A 01/23/2022   Procedure: ESOPHAGOGASTRODUODENOSCOPY (EGD);  Surgeon: Lemar Lofty., MD;   Location: [facility];  Service: Gastroenterology;  Laterality: N/A;   ESOPHAGOGASTRODUODENOSCOPY N/A 07/09/2022   Procedure: ESOPHAGOGASTRODUODENOSCOPY (EGD);  Surgeon: Shellia Cleverly, DO;  Location: [facility];  Service: Gastroenterology;  Laterality: N/A;   EUS N/A 01/23/2022   Procedure: UPPER ENDOSCOPIC ULTRASOUND (EUS) RADIAL;  Surgeon: Lemar Lofty., MD;  Location: [facility];  Service: Gastroenterology;  Laterality: N/A;   EVALUATION UNDER ANESTHESIA WITH FISTULECTOMY N/A 03/02/2018   Procedure: ANORECTAL EXAM UNDER ANESTHESIA WITH REPAIR OF SUPERFICIAL PERIRECTAL FISTULA AND HEMORRHOIDECTOMY;  Surgeon: [name], MD;  Location: [facility];  Service: General;  Laterality: N/A;   EXPLORATORY LAPAROTOMY     HEMOSTASIS CLIP PLACEMENT  07/09/2022   Procedure: HEMOSTASIS CLIP PLACEMENT;  Surgeon: Shellia Cleverly, DO;  Location: [facility];  Service: Gastroenterology;;   IR THORACENTESIS ASP PLEURAL SPACE W/IMG GUIDE  07/10/2022   REMOVAL OF STONES  01/23/2022   Procedure: REMOVAL OF STONES;  Surgeon: Lemar Lofty., MD;  Location: [facility];  Service: Gastroenterology;;   Dennison Mascot  01/23/2022   Procedure: SPHINCTEROTOMY;  Surgeon: Lemar Lofty., MD;  Location: [facility];  Service: Gastroenterology;;     A IV Location/Drains/Wounds Patient Lines/Drains/Airways Status     Active Line/Drains/Airways     Name Placement date Placement time Site Days   Peripheral IV 08/01/22 20 G 1" Left;Lateral Antecubital 08/01/22  1432  Antecubital  less than 1   Peripheral IV 08/01/22 18 G 1" Distal;Right;Lateral Wrist 08/01/22  1538  Wrist  less than 1   GI Stent 01/23/22  1510  --  190            Intake/Output Last 24 hours  Intake/Output Summary (Last 24 hours) at 08/01/2022 2200 Last data filed at 08/01/2022 2133 Gross per 24 hour  Intake 387 ml  Output --  Net 387 ml    Labs/Imaging Results for orders placed or performed during the  hospital encounter of 08/01/22 (from the past 48 hour(s))  Basic metabolic panel     Status: Abnormal   Collection Time: 08/01/22  3:38 PM  Result Value Ref Range   Sodium 143 135 - 145 mmol/L   Potassium 4.1 3.5 - 5.1 mmol/L   Chloride 114 (H) 98 - 111 mmol/L   CO2 21 (L) 22 - 32 mmol/L   Glucose, Bld 135 (H) 70 - 99 mg/dL    Comment: Glucose reference range applies only to samples taken after fasting for at least 8 hours.   BUN 32 (H) 8 - 23 mg/dL   Creatinine, Ser 1.61 (H) 0.61 - 1.24 mg/dL   Calcium 8.3 (L) 8.9 - 10.3 mg/dL   GFR, Estimated 43 (L) >60 mL/min    Comment: (NOTE) Calculated using the CKD-EPI Creatinine Equation (2021)    Anion gap 8 5 - 15    Comment: Performed at [facility], [address] [place], [place] [address]  CBC with Differential     Status: Abnormal   Collection Time: 08/01/22  3:38 PM  Result Value Ref Range   WBC 5.6 4.0 - 10.5 K/uL   RBC 1.40 (L) 4.22 - 5.81 MIL/uL  Hemoglobin 5.0 (LL) 13.0 - 17.0 g/dL    Comment: This critical result has verified and been called to [facility] by [facility] on 07 05 2024 at 1652, and has been read back.    HCT 16.4 (L) 39.0 - 52.0 %   MCV 117.1 (H) 80.0 - 100.0 fL   MCH 35.7 (H) 26.0 - 34.0 pg   MCHC 30.5 30.0 - 36.0 g/dL   RDW Not Measured 81.1 - 15.5 %   Platelets 137 (L) 150 - 400 K/uL    Comment: SPECIMEN CHECKED FOR CLOTS REPEATED TO VERIFY PLATELET COUNT CONFIRMED BY SMEAR    nRBC 1.3 (H) 0.0 - 0.2 %   Neutrophils Relative % 68 %   Neutro Abs 3.8 1.7 - 7.7 K/uL   Lymphocytes Relative 19 %   Lymphs Abs 1.1 0.7 - 4.0 K/uL   Monocytes Relative 11 %   Monocytes Absolute 0.6 0.1 - 1.0 K/uL   Eosinophils Relative 1 %   Eosinophils Absolute 0.1 0.0 - 0.5 K/uL   Basophils Relative 0 %   Basophils Absolute 0.0 0.0 - 0.1 K/uL   Immature Granulocytes 1 %   Abs Immature Granulocytes 0.03 0.00 - 0.07 K/uL   Ovalocytes PRESENT     Comment: Performed at [facility], [address] [place], [place] [address]  Protime-INR     Status: None   Collection Time: 08/01/22  3:38 PM  Result Value Ref Range   Prothrombin Time 15.0 11.4 - 15.2 seconds   INR 1.2 0.8 - 1.2    Comment: (NOTE) INR goal varies based on device and disease states. Performed at [facility], [address], [place], [place] [address]   Type and screen [facility]     Status: None (Preliminary result)   Collection Time: 08/01/22  3:38 PM  Result Value Ref Range   ABO/RH(D) A NEG    Antibody Screen NEG    Sample Expiration 08/04/2022,2359    Unit Number [id number]    Blood Component Type RBC LR PHER1    Unit division 00    Status of Unit ISSUED    Transfusion Status OK TO TRANSFUSE    Crossmatch Result      Compatible Performed at [facility], [address], [place], [place] [address]    Unit Number [id number]    Blood Component Type RED CELLS,LR    Unit division 00    Status of Unit ALLOCATED    Transfusion Status OK TO TRANSFUSE    Crossmatch Result Compatible   Troponin I (High Sensitivity)     Status: Abnormal   Collection Time: 08/01/22  3:38 PM  Result Value Ref Range   Troponin I (High Sensitivity) 51 (H) <18 ng/L    Comment: (NOTE) Elevated high sensitivity troponin I (hsTnI) values and significant  changes across serial measurements may suggest ACS but many other  chronic and acute conditions are known to elevate hsTnI results.  Refer to the "Links" section for chest pain algorithms and additional  guidance. Performed at [facility], [address], [place], [place] [address]   Urinalysis, w/ Reflex to Culture (Infection Suspected) -Urine, Clean Catch     Status: Abnormal   Collection Time: 08/01/22  4:52 PM  Result Value Ref Range   Specimen Source URINE, CLEAN CATCH    Color, Urine YELLOW YELLOW   APPearance CLEAR CLEAR   Specific Gravity, Urine 1.009 1.005 -  1.030  pH 5.0 5.0 - 8.0   Glucose, UA 150 (A) NEGATIVE mg/dL   Hgb urine dipstick NEGATIVE NEGATIVE   Bilirubin Urine NEGATIVE NEGATIVE   Ketones, ur NEGATIVE NEGATIVE mg/dL   Protein, ur NEGATIVE NEGATIVE mg/dL   Nitrite NEGATIVE NEGATIVE   Leukocytes,Ua NEGATIVE NEGATIVE   RBC / HPF 0-5 0 - 5 RBC/hpf   WBC, UA 0-5 0 - 5 WBC/hpf    Comment:        Reflex urine culture not performed if WBC <=10, OR if Squamous epithelial cells >5. If Squamous epithelial cells >5 suggest recollection.    Bacteria, UA NONE SEEN NONE SEEN   Squamous Epithelial / HPF 0-5 0 - 5 /HPF   Mucus PRESENT     Comment: Performed at [facility], [address], [place], [place] [address]  Troponin I (High Sensitivity)     Status: Abnormal   Collection Time: 08/01/22  5:34 PM  Result Value Ref Range   Troponin I (High Sensitivity) 58 (H) <18 ng/L    Comment: (NOTE) Elevated high sensitivity troponin I (hsTnI) values and significant  changes across serial measurements may suggest ACS but many other  chronic and acute conditions are known to elevate hsTnI results.  Refer to the "Links" section for chest pain algorithms and additional  guidance. Performed at [facility], [address], [place], [place] [address]   Prepare RBC (crossmatch)     Status: None   Collection Time: 08/01/22  5:57 PM  Result Value Ref Range   Order Confirmation      ORDER PROCESSED BY BLOOD BANK Performed at [facility], [address], [place], [place] [address]   POC occult blood, ED Provider will collect     Status: Abnormal   Collection Time: 08/01/22  6:47 PM  Result Value Ref Range   Fecal Occult Bld POSITIVE (A) NEGATIVE   CT Head Wo Contrast  Result Date: 08/01/2022 CLINICAL DATA:  Altered mental status EXAM: CT HEAD WITHOUT CONTRAST TECHNIQUE: Contiguous axial images were obtained from the base of the skull through the vertex without intravenous contrast.  RADIATION DOSE REDUCTION: This exam was performed according to the departmental dose-optimization program which includes automated exposure control, adjustment of the mA and/or kV according to patient size and/or use of iterative reconstruction technique. COMPARISON:  None Available. FINDINGS: Brain: No evidence of acute infarction, hemorrhage, hydrocephalus, extra-axial collection or mass lesion/mass effect. Vascular: No hyperdense vessel or unexpected calcification. Skull: Normal. Negative for fracture or focal lesion. Sinuses/Orbits: No acute finding. Other: None. IMPRESSION: No acute abnormality noted. Electronically Signed   By: Alcide Clever M.D.   On: 08/01/2022 16:15    Pending Labs Unresulted Labs (From admission, onward)     Start     Ordered   08/02/22 0500  Basic metabolic panel  Tomorrow morning,   R        08/01/22 2124   08/02/22 0400  CBC  Once-Timed,   STAT        08/01/22 2115   08/01/22 2359  CBC  ONCE - URGENT,   STAT        08/01/22 2115   08/01/22 2124  Hemoglobin A1c  Once,   R       Comments: To assess prior glycemic control    08/01/22 2123   08/01/22 2116  APTT  Add-on,   AD        08/01/22 2115   08/01/22 2112  Blood  gas, arterial  ONCE - STAT,   R        08/01/22 2112   08/01/22 2008  Low molecular wgt heparin (fractionated) (hepr anti-XA)  Add-on,   AD        08/01/22 2007            Vitals/Pain Today's Vitals   08/01/22 2107 08/01/22 2115 08/01/22 2135 08/01/22 2145  BP:  (!) 131/92 (!) 91/47 (!) 92/49  Pulse:  81 78 77  Resp:  19 19 18   Temp:  98.1 F (36.7 C)    TempSrc:  Axillary    SpO2: 93% 92% 95% 91%  Weight:      Height:      PainSc:        Isolation Precautions No active isolations  Medications Medications  pantoprozole (PROTONIX) 80 mg /NS 100 mL infusion (8 mg/hr Intravenous New Bag/Given 08/01/22 2135)  pantoprazole (PROTONIX) injection 40 mg (has no administration in time range)  levothyroxine (SYNTHROID) tablet 112 mcg (has  no administration in time range)  Vitamin D3 CAPS 5,000 Units (has no administration in time range)  acetaminophen (TYLENOL) tablet 650 mg (has no administration in time range)    Or  acetaminophen (TYLENOL) suppository 650 mg (has no administration in time range)  polyethylene glycol (MIRALAX / GLYCOLAX) packet 17 g (has no administration in time range)  sodium chloride flush (NS) 0.9 % injection 3 mL (has no administration in time range)  insulin aspart (novoLOG) injection 0-9 Units (has no administration in time range)  insulin aspart (novoLOG) injection 0-5 Units (has no administration in time range)  0.9 %  sodium chloride infusion (Manually program via Guardrails IV Fluids) ( Intravenous New Bag/Given 08/01/22 2023)  pantoprazole (PROTONIX) 80 mg /NS 100 mL IVPB (0 mg Intravenous Stopped 08/01/22 2133)    Mobility Unable to assess/fall risk     Focused Assessments GI - recently admitted in June for GI bleed dx with ulcer and received blood transfusions last month. Was discharged and sent to rehab. Dc from rehab on Monday. Pt's daughter reports increased fatigue/sob/ and altered mental status over the last 4 days. Reports dark stool. Hemaoccult positive in ED. Hgb 5. Pt has received 1 unit of blood so far. Second unit is released and are waiting on unit to be ready. Pressures have been soft 90s/50s throughout transfusion. All other vitals are stable. GI was consulted, protonix drip started. Plan is for scope tomorrow.    R Recommendations: See Admitting Provider Note  Report given to:   Additional Notes: n/a

## 2022-08-01 NOTE — Assessment & Plan Note (Signed)
manifested as leg edema today.  However given patient's soft blood pressure, and other vital instability, I will hold off on diuresis at this time.  This is a chronic diagnosis that is documented in the chart already

## 2022-08-01 NOTE — Assessment & Plan Note (Signed)
Is likely due to melena, in the setting of recent upper GI bleed.  GI has already been consulted.  I will treat the patient with pantoprazole infusion, 2 unit PRBC transfusion ordered already.  I will check the response at midnight tonight.  And then again at 4 AM.  With a target to keep hemoglobin over 8 g/dL.  Check PTT

## 2022-08-01 NOTE — ED Notes (Signed)
Pt tolerated first unit of blood well. No adverse reaction noted.

## 2022-08-01 NOTE — ED Notes (Signed)
Consult to Gastroenterology@19 :46 x 2.

## 2022-08-01 NOTE — Assessment & Plan Note (Signed)
Is a chronic diagnosis, rate controlled.  Per report form ER>>daughter, Patient has not taken Eliquis since June.

## 2022-08-01 NOTE — ED Notes (Addendum)
Pt remains restless, non-aggressively agitated, A&Ox2 with confusion, speech clear. To CT. Daughter at [facility].

## 2022-08-01 NOTE — Progress Notes (Signed)
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  He completed radiation in March 2024.  He was recently hospitalized for GI bleeding a month ago, .  He was discharged to nursing home after that.  He was recently discharged from nursing home 4 days ago, was doing well at home, 1 to 2 days ago, he was noticed to be very fatigued, slightly disorientated, and daughter saw black stool in his pants.  He was brought to ED by his daughter today due to worsening fatigue, and confusion.  Hemoglobin 5.0 today in the ED.  He was lethargic when I saw him, daughter at bedside.  Per daughter, he was supposed to be off blood thinner after previous hospice discharge, but he received for several days in the nursing home, and it was stopped last week after patient found out.   Objective:  Vitals:   08/01/22 2135 08/01/22 2145  BP: (!) 91/47 (!) 92/49  Pulse: 78 77  Resp: 19 18  Temp:    SpO2: 95% 91%    Body mass index is 30.84 kg/m.  Intake/Output Summary (Last 24 hours) at 08/01/2022 2226 Last data filed at 08/01/2022 2133 Gross per 24 hour  Intake 387 ml  Output --  Net 387 ml     Sclerae unicteric  Oropharynx clear  Abdomen benign  MSK no focal spinal tenderness, no peripheral edema  Neuro nonfocal, patient is lethargic.    CBG (last 3)  No results for input(s): "GLUCAP" in the last 72 hours.   Labs:   Urine Studies No results for input(s): "UHGB", "CRYS" in the last 72 hours.  Invalid input(s): "UACOL", "UAPR", "USPG", "UPH", "UTP", "UGL", "UKET", "UBIL", "UNIT", "UROB", "ULEU", "UEPI", "UWBC", "URBC", "UBAC", "CAST", "UCOM", "BILUA"  Basic Metabolic Panel: Recent Labs  Lab 08/01/22 1228 08/01/22 1538  NA 145 143  K 4.2 4.1  CL 113* 114*  CO2 21* 21*  GLUCOSE 128* 135*  BUN 30* 32*  CREATININE 1.57* 1.53*  CALCIUM 8.7* 8.3*   GFR Estimated Creatinine  Clearance: 36 mL/min (A) (by C-G formula based on SCr of 1.53 mg/dL (H)). Liver Function Tests: Recent Labs  Lab 08/01/22 1228  AST 28  ALT 15  ALKPHOS 68  BILITOT 0.7  PROT 5.4*  ALBUMIN 3.0*   No results for input(s): "LIPASE", "AMYLASE" in the last 168 hours. No results for input(s): "AMMONIA" in the last 168 hours. Coagulation profile Recent Labs  Lab 08/01/22 1538  INR 1.2    CBC: Recent Labs  Lab 08/01/22 1538  WBC 5.6  NEUTROABS 3.8  HGB 5.0*  HCT 16.4*  MCV 117.1*  PLT 137*   Cardiac Enzymes: No results for input(s): "CKTOTAL", "CKMB", "CKMBINDEX", "TROPONINI" in the last 168 hours. BNP: Invalid input(s): "POCBNP" CBG: No results for input(s): "GLUCAP" in the last 168 hours. D-Dimer No results for input(s): "DDIMER" in the last 72 hours. Hgb A1c No results for input(s): "HGBA1C" in the last 72 hours. Lipid Profile No results for input(s): "CHOL", "HDL", "LDLCALC", "TRIG", "CHOLHDL", "LDLDIRECT" in the last 72 hours. Thyroid function studies No results for input(s): "TSH", "T4TOTAL", "T3FREE", "THYROIDAB" in the last 72 hours.  Invalid input(s): "FREET3" Anemia work up Recent Labs    08/01/22 1229  FERRITIN 105   Microbiology No results found for this or any previous visit (from the past 240 hour(s)).    Studies:  CT Head Wo Contrast  Result Date: 08/01/2022 CLINICAL DATA:  Altered mental status EXAM: CT HEAD WITHOUT CONTRAST TECHNIQUE: Contiguous axial images were obtained from the base of the skull through the vertex without intravenous contrast. RADIATION DOSE REDUCTION: This exam was performed according to the departmental dose-optimization program which includes automated exposure control, adjustment of the mA and/or kV according to patient size and/or use of iterative reconstruction technique. COMPARISON:  None Available. FINDINGS: Brain: No evidence of acute infarction, hemorrhage, hydrocephalus, extra-axial collection or mass lesion/mass  effect. Vascular: No hyperdense vessel or unexpected calcification. Skull: Normal. Negative for fracture or focal lesion. Sinuses/Orbits: No acute finding. Other: None. IMPRESSION: No acute abnormality noted. Electronically Signed   By: Alcide Clever M.D.   On: 08/01/2022 16:15    Assessment: 89 y.o. male   Recurrent severe anemia, secondary to GI bleeding  Acute Metabolic encephalopathy, probably secondary to severe anemia and dehydration  Leg edema  AF HTN CKD stage III    Plan:  -If any recurrent respiratory anemia is likely related to GI bleeding.  He underwent EGD during his last hospital admission for GI bleeding, which showed gastroenteritis in the prepyloric region, possibly related to radiation, nonbleeding gastric ulcer with visible vessel, clip was placed.  Erythematous duodenopathy, previous anteriorly carcinoma was not seen, indicating good response to radiation  -I am sure GI will f/u him after admission  -I agree with blood transfusion to keep Hg>8.0  -ferritin 105 today, ok to hold iv iron for now -I doubt he is a candidate for anticoagulation for his persistent atrial fibrillation, due to his recurrent severe GI bleeding. -I will follow-up as needed for the next week.    Malachy Mood, MD 08/01/2022

## 2022-08-01 NOTE — ED Notes (Addendum)
Pt restless, confused, NAD, semi-calm, follows some commands, VSS. Daughter at [facility]. Recent labs drawn at [facility] today. Results in EPIC/ chart. Blood back arm band in place. Hgb reportedly 5.

## 2022-08-01 NOTE — Assessment & Plan Note (Signed)
Likely metabolic in nature.  Precipitated by patient anemia.  However patient does have a history of OSA, is obese, and I am concerned that we could be missing a hypercapnia, therefore we will get an ABG.  Otherwise CAT scan of the head is nonactionable and exam appears nonfocal.  Will monitor clinically

## 2022-08-01 NOTE — H&P (Signed)
History and Physical    Patient: Carl Woods:[id number] DOB: December 07, 1931 DOA: 08/01/2022 DOS: the patient was seen and examined on 08/01/2022 PCP: [name], MD  Patient coming from: Home  Chief Complaint:  Chief Complaint  Patient presents with   Altered Mental Status   HPI: Carl Woods is a 89 y.o. male with medical history significant [facility] after rx. For GI bleeding. S.p EGD and  significant inflammation in the prepyloric antrum with a single ulcer with visible vessel which was clipped .  Patien DC to rehab and then recent DC to home since then.  Patient broguht in by duaghter (who was interviewed by ER provider).  Daughter noted that this patient has been increasingly lethargic/pale/fatigued since yesterday evening.  Patient offers no complaints such as chest pain or shortness of breath loss of consciousness or tremor or any pain.  Daughter also noted that the patient was having black stools in his depends.  Patient is therefore brought to [facility] ER.  In the ER patient is mostly lethargic, transiently arousable and then falling back asleep.  Notable for marked anemia, melena confirmed on rectal exam.  Medical evaluation is sought.  Per report patient has not taken any anticoagulation since discharge from [facility] Review of Systems: unable to review all systems due to the inability of the patient to answer questions. 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 coli infection    Fatty liver    GERD (gastroesophageal reflux disease)    History of colon polyps    Hypothyroidism     Iron deficiency anemia    Nodular basal cell carcinoma (BCC) 11/05/2017   Left Forehead (treatment after biopsy)   OSA on CPAP    Perianal rash    Recurrent epistaxis    Renal cyst 01/04/2013   Small left peripelvic renal cysts , noted on US Renal   SCCA (squamous cell carcinoma) of skin 10/17/2015   Left Sup Bridge of Nose (curet, cautery and 5FU)   Seasonal allergies    Superficial basal cell carcinoma (BCC) 10/17/2015   Left Bulb of Nose (curet, cautery and 5FU)   Tubular adenoma    Past Surgical History:  Procedure Laterality Date   BILIARY STENT PLACEMENT N/A 01/23/2022   Procedure: BILIARY STENT PLACEMENT;  Surgeon: Lemar Lofty., MD;  Location: [facility];  Service: Gastroenterology;  Laterality: N/A;   BIOPSY  01/23/2022   Procedure: BIOPSY;  Surgeon: Meridee Score Netty Starring., MD;  Location: [facility];  Service: Gastroenterology;;   BIOPSY  07/09/2022   Procedure: BIOPSY;  Surgeon: Shellia Cleverly, DO;  Location: [facility] ENDOSCOPY;  Service: Gastroenterology;;   COLONOSCOPY  multiple   CYSTOSCOPY     ENDOSCOPIC RETROGRADE CHOLANGIOPANCREATOGRAPHY (ERCP) WITH PROPOFOL N/A 01/23/2022   Procedure: ENDOSCOPIC RETROGRADE CHOLANGIOPANCREATOGRAPHY (ERCP) WITH PROPOFOL;  Surgeon: Lemar Lofty., MD;  Location: [facility];  Service: Gastroenterology;  Laterality: N/A;   ESOPHAGOGASTRODUODENOSCOPY  multiple   ESOPHAGOGASTRODUODENOSCOPY N/A 01/23/2022   Procedure: ESOPHAGOGASTRODUODENOSCOPY (EGD);  Surgeon: Lemar Lofty., MD;  Location: [facility]  ENDOSCOPY;  Service: Gastroenterology;  Laterality: N/A;   ESOPHAGOGASTRODUODENOSCOPY N/A 07/09/2022   Procedure: ESOPHAGOGASTRODUODENOSCOPY (EGD);  Surgeon: Shellia Cleverly, DO;  Location: [facility] ENDOSCOPY;  Service: Gastroenterology;  Laterality: N/A;   EUS N/A 01/23/2022   Procedure: UPPER ENDOSCOPIC ULTRASOUND (EUS) RADIAL;  Surgeon: Lemar Lofty., MD;  Location: [facility] ENDOSCOPY;  Service: Gastroenterology;   Laterality: N/A;   EVALUATION UNDER ANESTHESIA WITH FISTULECTOMY N/A 03/02/2018   Procedure: ANORECTAL EXAM UNDER ANESTHESIA WITH REPAIR OF SUPERFICIAL PERIRECTAL FISTULA AND HEMORRHOIDECTOMY;  Surgeon: [name], MD;  Location: [facility] ORS;  Service: General;  Laterality: N/A;   EXPLORATORY LAPAROTOMY     HEMOSTASIS CLIP PLACEMENT  07/09/2022   Procedure: HEMOSTASIS CLIP PLACEMENT;  Surgeon: Shellia Cleverly, DO;  Location: [facility] ENDOS[facility];  Service: Gastroenterology;;   IR THORACENTESIS ASP PLEURAL SPACE W/IMG GUIDE  07/10/2022   REMOVAL OF STONES  01/23/2022   Procedure: REMOVAL OF STONES;  Surgeon: Lemar Lofty., MD;  Location: [facility];  Service: Gastroenterology;;   Dennison Mascot  01/23/2022   Procedure: Dennison Mascot;  Surgeon: Mansouraty, Netty Starring., MD;  Location: [facility] ENDOSCOPY;  Service: Gastroenterology;;   Social History:  reports that he quit smoking about 18 years ago. His smoking use included cigarettes. He has a 20.00 pack-year smoking history. He has never used smokeless tobacco. He reports current alcohol use. He reports that he does not use drugs.  No Known Allergies  Family History  Problem Relation Age of Onset   Heart disease Mother    Other Father        old age   Ovarian cancer Sister    Colon cancer Neg Hx    Stomach cancer Neg Hx    Rectal cancer Neg Hx    Esophageal cancer Neg Hx    Liver cancer Neg Hx     Prior to Admission medications   Medication Sig Start Date End Date Taking? Authorizing Provider  bumetanide (BUMEX) 1 MG tablet Take 1 tablet (1 mg total) by mouth daily. 07/11/22   Burnadette Pop, MD  Cholecalciferol (VITAMIN D3) 5000 units CAPS Take 1 capsule by mouth every evening.    [provider]  dapagliflozin propanediol (FARXIGA) 10 MG TABS tablet Take 10 mg by mouth in the morning. 05/29/20   [provider]  Fe Fum-Fe Poly-Vit C-Lactobac (FUSION) 65-65-25-30 MG CAPS Take 1 capsule by mouth 3 (three) times a week.     [provider]  fexofenadine (ALLEGRA) 180 MG tablet Take 180 mg by mouth as needed for allergies or rhinitis.    [provider]  Glucosamine Sulfate 500 MG TABS Take 500 mg by mouth daily. 07/08/16   [provider]  Glucosamine-Chondroit-Vit C-Mn (GLUCOSAMINE CHONDR 500 COMPLEX PO) Take 1 tablet by mouth daily.     [provider]  ketoconazole (NIZORAL) 2 % cream Apply 1 Application topically 2 (two) times daily as needed (skin rash/irritation.).    [provider]  levothyroxine (SYNTHROID, LEVOTHROID) 112 MCG tablet Take 112 mcg by mouth daily before breakfast.  02/11/16   [provider]  metoprolol succinate (TOPROL-XL) 25 MG 24 hr tablet Take 0.5 tablets (12.5 mg total) by mouth daily. Take with or immediately following a meal. 07/11/22 01/07/23  Burnadette Pop, MD  nitroGLYCERIN (NITROSTAT) 0.4 MG SL tablet Place 1 tablet (0.4 mg total) under the tongue every 5 (five) minutes as needed for up to 25 days for chest pain. 01/31/20 01/18/24  [name], MD  olopatadine (PATANOL) 0.1 % ophthalmic solution  Place 1 drop into both eyes daily as needed for allergies. 07/08/16   [provider]  pantoprazole (PROTONIX) 40 MG tablet Take 1 tablet (40 mg total) by mouth 2 (two) times daily. 07/11/22   Burnadette Pop, MD  potassium chloride SA (KLOR-CON M) 20 MEQ tablet Take 1 tablet (20 mEq total) by mouth 2 (two) times daily. 07/30/22   Malachy Mood, MD  sodium chloride (OCEAN) 0.65 % SOLN nasal spray Place 1 spray into both nostrils as needed for congestion.    [provider]  sucralfate (CARAFATE) 1 GM/10ML suspension Take 10 mLs (1 g total) by mouth 4 (four) times daily -  with meals and at bedtime. 07/11/22   Burnadette Pop, MD    Physical Exam: Vitals:   08/01/22 2015 08/01/22 2030 08/01/22 2045 08/01/22 2107  BP: (!) 106/46 (!) 114/50 (!) 99/47   Pulse:      Resp: 17 14 18    Temp:      TempSrc:      SpO2:    93%  Weight:       Height:       Neural: Patient is sleepy, arousable to tactile stimulation, wakes up transiently makes eye contact, reports no complaints and then tends to fall back asleep. No focal defiicit is noted.  Per exam, patient is having some snoring, conducted breath sounds are heard otherwise clear Cardiovascular exam S1-S2 normal Abdomen bowel sounds normal all quadrants soft nontender Rectal exam: Per report patient had melena. Extremities warm 1+ edema bilateral legs, no rash no focal motor deficit is noted.  Although exam is limited due to patient mentation Data Reviewed:  Labs on Admission:  Results for orders placed or performed during the hospital encounter of 08/01/22 (from the past 24 hour(s))  Basic metabolic panel     Status: Abnormal   Collection Time: 08/01/22  3:38 PM  Result Value Ref Range   Sodium 143 135 - 145 mmol/L   Potassium 4.1 3.5 - 5.1 mmol/L   Chloride 114 (H) 98 - 111 mmol/L   CO2 21 (L) 22 - 32 mmol/L   Glucose, Bld 135 (H) 70 - 99 mg/dL   BUN 32 (H) 8 - 23 mg/dL   Creatinine, Ser 1.61 (H) 0.61 - 1.24 mg/dL   Calcium 8.3 (L) 8.9 - 10.3 mg/dL   GFR, Estimated 43 (L) >60 mL/min   Anion gap 8 5 - 15  CBC with Differential     Status: Abnormal   Collection Time: 08/01/22  3:38 PM  Result Value Ref Range   WBC 5.6 4.0 - 10.5 K/uL   RBC 1.40 (L) 4.22 - 5.81 MIL/uL   Hemoglobin 5.0 (LL) 13.0 - 17.0 g/dL   HCT 09.6 (L) 04.5 - 40.9 %   MCV 117.1 (H) 80.0 - 100.0 fL   MCH 35.7 (H) 26.0 - 34.0 pg   MCHC 30.5 30.0 - 36.0 g/dL   RDW Not Measured 81.1 - 15.5 %   Platelets 137 (L) 150 - 400 K/uL   nRBC 1.3 (H) 0.0 - 0.2 %   Neutrophils Relative % 68 %   Neutro Abs 3.8 1.7 - 7.7 K/uL   Lymphocytes Relative 19 %   Lymphs Abs 1.1 0.7 - 4.0 K/uL   Monocytes Relative 11 %   Monocytes Absolute 0.6 0.1 - 1.0 K/uL   Eosinophils Relative 1 %   Eosinophils Absolute 0.1 0.0 - 0.5 K/uL   Basophils Relative 0 %   Basophils Absolute 0.0 0.0 -  0.1 K/uL   Immature  Granulocytes 1 %   Abs Immature Granulocytes 0.03 0.00 - 0.07 K/uL   Ovalocytes PRESENT   Protime-INR     Status: None   Collection Time: 08/01/22  3:38 PM  Result Value Ref Range   Prothrombin Time 15.0 11.4 - 15.2 seconds   INR 1.2 0.8 - 1.2  Type and screen [facility]     Status: None (Preliminary result)   Collection Time: 08/01/22  3:38 PM  Result Value Ref Range   ABO/RH(D) A NEG    Antibody Screen NEG    Sample Expiration 08/04/2022,2359    Unit Number [id number]    Blood Component Type RBC LR PHER1    Unit division 00    Status of Unit ISSUED    Transfusion Status OK TO TRANSFUSE    Crossmatch Result      Compatible Performed at [facility] [address], [place], [place] [address]    Unit Number [id number]    Blood Component Type RED CELLS,LR    Unit division 00    Status of Unit ALLOCATED    Transfusion Status OK TO TRANSFUSE    Crossmatch Result Compatible   Troponin I (High Sensitivity)     Status: Abnormal   Collection Time: 08/01/22  3:38 PM  Result Value Ref Range   Troponin I (High Sensitivity) 51 (H) <18 ng/L  Urinalysis, w/ Reflex to Culture (Infection Suspected) -Urine, Clean Catch     Status: Abnormal   Collection Time: 08/01/22  4:52 PM  Result Value Ref Range   Specimen Source URINE, CLEAN CATCH    Color, Urine YELLOW YELLOW   APPearance CLEAR CLEAR   Specific Gravity, Urine 1.009 1.005 - 1.030   pH 5.0 5.0 - 8.0   Glucose, UA 150 (A) NEGATIVE mg/dL   Hgb urine dipstick NEGATIVE NEGATIVE   Bilirubin Urine NEGATIVE NEGATIVE   Ketones, ur NEGATIVE NEGATIVE mg/dL   Protein, ur NEGATIVE NEGATIVE mg/dL   Nitrite NEGATIVE NEGATIVE   Leukocytes,Ua NEGATIVE NEGATIVE   RBC / HPF 0-5 0 - 5 RBC/hpf   WBC, UA 0-5 0 - 5 WBC/hpf   Bacteria, UA NONE SEEN NONE SEEN   Squamous Epithelial / HPF 0-5 0 - 5 /HPF   Mucus PRESENT   Troponin I (High Sensitivity)     Status: Abnormal   Collection Time: 08/01/22  5:34  PM  Result Value Ref Range   Troponin I (High Sensitivity) 58 (H) <18 ng/L  Prepare RBC (crossmatch)     Status: None   Collection Time: 08/01/22  5:57 PM  Result Value Ref Range   Order Confirmation      ORDER PROCESSED BY BLOOD BANK Performed at [facility], [address] [place], [place] [address]   POC occult blood, ED Provider will collect     Status: Abnormal   Collection Time: 08/01/22  6:47 PM  Result Value Ref Range   Fecal Occult Bld POSITIVE (A) NEGATIVE   Basic Metabolic Panel: Recent Labs  Lab 08/01/22 1228 08/01/22 1538  NA 145 143  K 4.2 4.1  CL 113* 114*  CO2 21* 21*  GLUCOSE 128* 135*  BUN 30* 32*  CREATININE 1.57* 1.53*  CALCIUM 8.7* 8.3*   Liver Function Tests: Recent Labs  Lab 08/01/22 1228  AST 28  ALT 15  ALKPHOS 68  BILITOT 0.7  PROT 5.4*  ALBUMIN 3.0*   No results for input(s): "LIPASE", "AMYLASE" in  the last 168 hours. No results for input(s): "AMMONIA" in the last 168 hours. CBC: Recent Labs  Lab 08/01/22 1538  WBC 5.6  NEUTROABS 3.8  HGB 5.0*  HCT 16.4*  MCV 117.1*  PLT 137*   Cardiac Enzymes: Recent Labs  Lab 08/01/22 1538 08/01/22 1734  TROPONINIHS 51* 58*    BNP (last 3 results) No results for input(s): "PROBNP" in the last 8760 hours. CBG: No results for input(s): "GLUCAP" in the last 168 hours.  Radiological Exams on Admission:  CT Head Wo Contrast  Result Date: 08/01/2022 CLINICAL DATA:  Altered mental status EXAM: CT HEAD WITHOUT CONTRAST TECHNIQUE: Contiguous axial images were obtained from the base of the skull through the vertex without intravenous contrast. RADIATION DOSE REDUCTION: This exam was performed according to the departmental dose-optimization program which includes automated exposure control, adjustment of the mA and/or kV according to patient size and/or use of iterative reconstruction technique. COMPARISON:  None Available. FINDINGS: Brain: No evidence of acute infarction,  hemorrhage, hydrocephalus, extra-axial collection or mass lesion/mass effect. Vascular: No hyperdense vessel or unexpected calcification. Skull: Normal. Negative for fracture or focal lesion. Sinuses/Orbits: No acute finding. Other: None. IMPRESSION: No acute abnormality noted. Electronically Signed   By: Alcide Clever M.D.   On: 08/01/2022 16:15    EKG: Independently reviewed. Afib.   Assessment and Plan: * Acute encephalopathy Likely metabolic in nature.  Precipitated by patient anemia.  However patient does have a history of OSA, is obese, and I am concerned that we could be missing a hypercapnia, therefore we will get an ABG.  Otherwise CAT scan of the head is nonactionable and exam appears nonfocal.  Will monitor clinically  Hypervolemia manifested as leg edema today.  However given patient's soft blood pressure, and other vital instability, I will hold off on diuresis at this time.  This is a chronic diagnosis that is documented in the chart already  Symptomatic anemia Is likely due to melena, in the setting of recent upper GI bleed.  GI has already been consulted.  I will treat the patient with pantoprazole infusion, 2 unit PRBC transfusion ordered already.  I will check the response at midnight tonight.  And then again at 4 AM.  With a target to keep hemoglobin over 8 g/dL.  Check PTT  Atrial fibrillation (HCC) Is a chronic diagnosis, rate controlled.  Per report form ER>>daughter, Patient has not taken Eliquis since June.      Advance Care Planning:   Code Status: Prior full code based on documentation from prior hospitalisation.  Consults: GI consult requestd.  Family Communication: family aware of patient hospitalisation.  Severity of Illness: The appropriate patient status for this patient is INPATIENT. Inpatient status is judged to be reasonable and necessary in order to provide the required intensity of service to ensure the patient's safety. The patient's presenting  symptoms, physical exam findings, and initial radiographic and laboratory data in the context of their chronic comorbidities is felt to place them at high risk for further clinical deterioration. Furthermore, it is not anticipated that the patient will be medically stable for discharge from the hospital within 2 midnights of admission.   * I certify that at the point of admission it is my clinical judgment that the patient will require inpatient hospital care spanning beyond 2 midnights from the point of admission due to high intensity of service, high risk for further deterioration and high frequency of surveillance required.*  Author: Nolberto Hanlon, MD 08/01/2022 9:08 PM  For on call review [email or web address].

## 2022-08-01 NOTE — ED Triage Notes (Signed)
Patient BIB daughter who reports AMS. Patient was discharged from Rehab Monday to live at home by himself. On Thursday family noticed he was more confused, weak, and drowsy. Patient was recently admitted for GI bleed and low hemoglobin. Had labs drawn at the cancer center today. No results have come back.  On arrival patient is falling asleep in the wheelchair. A&Ox3 and delayed responses. No extremity weakness or facial droop. Difficult pivot to stretcher.

## 2022-08-01 NOTE — Telephone Encounter (Signed)
Erroneous

## 2022-08-02 ENCOUNTER — Inpatient Hospital Stay (HOSPITAL_COMMUNITY): Payer: PPO | Admitting: Certified Registered Nurse Anesthetist

## 2022-08-02 ENCOUNTER — Inpatient Hospital Stay (HOSPITAL_COMMUNITY): Payer: PPO

## 2022-08-02 ENCOUNTER — Encounter (HOSPITAL_COMMUNITY): Payer: Self-pay | Admitting: Internal Medicine

## 2022-08-02 ENCOUNTER — Encounter (HOSPITAL_COMMUNITY)
Admission: EM | Disposition: A | Discharge status: Skilled Nursing Facility | Payer: Self-pay | Source: Home / Self Care | Attending: Internal Medicine

## 2022-08-02 DIAGNOSIS — K922 Gastrointestinal hemorrhage, unspecified: Secondary | ICD-10-CM

## 2022-08-02 DIAGNOSIS — K31819 Angiodysplasia of stomach and duodenum without bleeding: Secondary | ICD-10-CM | POA: Diagnosis not present

## 2022-08-02 DIAGNOSIS — K5521 Angiodysplasia of colon with hemorrhage: Secondary | ICD-10-CM | POA: Diagnosis not present

## 2022-08-02 DIAGNOSIS — K5520 Angiodysplasia of colon without hemorrhage: Secondary | ICD-10-CM | POA: Diagnosis not present

## 2022-08-02 DIAGNOSIS — I5031 Acute diastolic (congestive) heart failure: Secondary | ICD-10-CM | POA: Diagnosis not present

## 2022-08-02 DIAGNOSIS — I110 Hypertensive heart disease with heart failure: Secondary | ICD-10-CM

## 2022-08-02 DIAGNOSIS — Z7189 Other specified counseling: Secondary | ICD-10-CM

## 2022-08-02 DIAGNOSIS — D62 Acute posthemorrhagic anemia: Secondary | ICD-10-CM | POA: Diagnosis not present

## 2022-08-02 DIAGNOSIS — Z515 Encounter for palliative care: Secondary | ICD-10-CM | POA: Diagnosis not present

## 2022-08-02 DIAGNOSIS — Z66 Do not resuscitate: Secondary | ICD-10-CM

## 2022-08-02 DIAGNOSIS — G9340 Encephalopathy, unspecified: Secondary | ICD-10-CM | POA: Diagnosis not present

## 2022-08-02 DIAGNOSIS — K31811 Angiodysplasia of stomach and duodenum with bleeding: Secondary | ICD-10-CM

## 2022-08-02 DIAGNOSIS — K3189 Other diseases of stomach and duodenum: Secondary | ICD-10-CM | POA: Diagnosis not present

## 2022-08-02 DIAGNOSIS — D649 Anemia, unspecified: Secondary | ICD-10-CM | POA: Diagnosis not present

## 2022-08-02 DIAGNOSIS — K921 Melena: Secondary | ICD-10-CM | POA: Diagnosis not present

## 2022-08-02 DIAGNOSIS — R4182 Altered mental status, unspecified: Secondary | ICD-10-CM

## 2022-08-02 DIAGNOSIS — Z87891 Personal history of nicotine dependence: Secondary | ICD-10-CM

## 2022-08-02 HISTORY — PX: ENTEROSCOPY: SHX5533

## 2022-08-02 HISTORY — PX: HOT HEMOSTASIS: SHX5433

## 2022-08-02 HISTORY — PX: BIOPSY: SHX5522

## 2022-08-02 LAB — BASIC METABOLIC PANEL
Anion gap: 11 (ref 5–15)
BUN: 32 mg/dL — ABNORMAL HIGH (ref 8–23)
CO2: 20 mmol/L — ABNORMAL LOW (ref 22–32)
Calcium: 8.3 mg/dL — ABNORMAL LOW (ref 8.9–10.3)
Chloride: 114 mmol/L — ABNORMAL HIGH (ref 98–111)
Creatinine, Ser: 1.55 mg/dL — ABNORMAL HIGH (ref 0.61–1.24)
GFR, Estimated: 43 mL/min — ABNORMAL LOW (ref >60)
Glucose, Bld: 117 mg/dL — ABNORMAL HIGH (ref 70–99)
Potassium: 4.4 mmol/L (ref 3.5–5.1)
Sodium: 145 mmol/L (ref 135–145)

## 2022-08-02 LAB — TYPE AND SCREEN: Unit division: 0

## 2022-08-02 LAB — VITAMIN B12: Vitamin B-12: 451 pg/mL (ref 180–914)

## 2022-08-02 LAB — HEMOGLOBIN AND HEMATOCRIT, BLOOD
HCT: 22 % — ABNORMAL LOW (ref 39.0–52.0)
HCT: 28.8 % — ABNORMAL LOW (ref 39.0–52.0)
HCT: 32 % — ABNORMAL LOW (ref 39.0–52.0)
Hemoglobin: 7 g/dL — ABNORMAL LOW (ref 13.0–17.0)
Hemoglobin: 9.3 g/dL — ABNORMAL LOW (ref 13.0–17.0)
Hemoglobin: 10.2 g/dL — ABNORMAL LOW (ref 13.0–17.0)

## 2022-08-02 LAB — RETICULOCYTES
Immature Retic Fract: 28.2 % — ABNORMAL HIGH (ref 2.3–15.9)
RBC.: 2.11 MIL/uL — ABNORMAL LOW (ref 4.22–5.81)
Retic Count, Absolute: 102.1 10*3/uL (ref 19.0–186.0)
Retic Ct Pct: 4.8 % — ABNORMAL HIGH (ref 0.4–3.1)

## 2022-08-02 LAB — BPAM RBC
Blood Product Expiration Date: 202408062359
ISSUE DATE / TIME: 202407051836
ISSUE DATE / TIME: 202407060646
ISSUE DATE / TIME: 202407060950

## 2022-08-02 LAB — FOLATE: Folate: 10.9 ng/mL (ref >5.9)

## 2022-08-02 LAB — GLUCOSE, CAPILLARY
Glucose-Capillary: 106 mg/dL — ABNORMAL HIGH (ref 70–99)
Glucose-Capillary: 95 mg/dL (ref 70–99)
Glucose-Capillary: 105 mg/dL — ABNORMAL HIGH (ref 70–99)

## 2022-08-02 LAB — IRON AND TIBC
Iron: 139 ug/dL (ref 45–182)
Saturation Ratios: 48 % — ABNORMAL HIGH (ref 17.9–39.5)
TIBC: 288 ug/dL (ref 250–450)
UIBC: 149 ug/dL

## 2022-08-02 LAB — MRSA NEXT GEN BY PCR, NASAL: MRSA by PCR Next Gen: NOT DETECTED

## 2022-08-02 LAB — PREPARE RBC (CROSSMATCH)

## 2022-08-02 LAB — HEPARIN ANTI-XA: Heparin LMW: <0.1 IU/mL

## 2022-08-02 LAB — FERRITIN: Ferritin: 103 ng/mL (ref 24–336)

## 2022-08-02 LAB — APTT: aPTT: 25 seconds (ref 24–36)

## 2022-08-02 LAB — HEMOGLOBIN A1C
Hgb A1c MFr Bld: 4.4 % — ABNORMAL LOW (ref 4.8–5.6)
Mean Plasma Glucose: 79.58 mg/dL

## 2022-08-02 LAB — TROPONIN I (HIGH SENSITIVITY): Troponin I (High Sensitivity): 43 ng/L — ABNORMAL HIGH (ref <18)

## 2022-08-02 SURGERY — ENTEROSCOPY
Anesthesia: Monitor Anesthesia Care

## 2022-08-02 MED ORDER — PROPOFOL 10 MG/ML IV BOLUS
INTRAVENOUS | Status: DC | PRN
  Administered 2022-08-02: 30 mg via INTRAVENOUS
  Administered 2022-08-02 (×2): 20 mg via INTRAVENOUS

## 2022-08-02 MED ORDER — PIPERACILLIN-TAZOBACTAM 3.375 G IVPB
3.3750 g | Freq: Three times a day (TID) | INTRAVENOUS | Status: DC
  Administered 2022-08-02 – 2022-08-08 (×17): 3.375 g via INTRAVENOUS
  Filled 2022-08-02 (×17): qty 50

## 2022-08-02 MED ORDER — PHENYLEPHRINE HCL (PRESSORS) 10 MG/ML IV SOLN
INTRAVENOUS | Status: AC
  Filled 2022-08-02: qty 1

## 2022-08-02 MED ORDER — PIPERACILLIN-TAZOBACTAM 3.375 G IVPB 30 MIN
3.3750 g | Freq: Once | INTRAVENOUS | Status: AC
  Administered 2022-08-02: 3.375 g via INTRAVENOUS
  Filled 2022-08-02: qty 50

## 2022-08-02 MED ORDER — METOCLOPRAMIDE HCL 5 MG/ML IJ SOLN
10.0000 mg | Freq: Once | INTRAMUSCULAR | Status: AC
  Administered 2022-08-02: 10 mg via INTRAVENOUS
  Filled 2022-08-02: qty 2

## 2022-08-02 MED ORDER — PHENYLEPHRINE HCL-NACL 20-0.9 MG/250ML-% IV SOLN
INTRAVENOUS | Status: DC | PRN
  Administered 2022-08-02: 30 ug/min via INTRAVENOUS

## 2022-08-02 MED ORDER — GLUCAGON HCL RDNA (DIAGNOSTIC) 1 MG IJ SOLR
INTRAMUSCULAR | Status: DC | PRN
  Administered 2022-08-02: .5 mg via INTRAVENOUS

## 2022-08-02 MED ORDER — PROPOFOL 500 MG/50ML IV EMUL
INTRAVENOUS | Status: AC
  Filled 2022-08-02: qty 50

## 2022-08-02 MED ORDER — PROPOFOL 500 MG/50ML IV EMUL
INTRAVENOUS | Status: DC | PRN
  Administered 2022-08-02: 75 ug/kg/min via INTRAVENOUS

## 2022-08-02 MED ORDER — LACTATED RINGERS IV SOLN
INTRAVENOUS | Status: AC | PRN
  Administered 2022-08-02: 1000 mL via INTRAVENOUS

## 2022-08-02 MED ORDER — SODIUM CHLORIDE 0.9% IV SOLUTION: Freq: Once | INTRAVENOUS | Status: AC

## 2022-08-02 MED ORDER — PHENYLEPHRINE 80 MCG/ML (10ML) SYRINGE FOR IV PUSH (FOR BLOOD PRESSURE SUPPORT)
PREFILLED_SYRINGE | INTRAVENOUS | Status: DC | PRN
  Administered 2022-08-02: 160 ug via INTRAVENOUS

## 2022-08-02 NOTE — Consult Note (Signed)
Consultation Note Date: 08/02/2022   Patient Name: Carl Woods  DOB: December 08, 1931  MRN: [id number]  Age / Sex: 89 y.o., male   PCP: [name], MD Referring Physician: [name], Eric J, DO  Reason for Consultation: Establishing goals of care     Chief Complaint/History of Present Illness:   Patient is an 89 year old male with a past medical history of paroxysmal A-fib, ampullary carcinoma, history of AVMs, sacral angiodysplasia, BPH, CKD stage III, history of diverticulosis, hypothyroidism, OSA, obesity, and BPH who was admitted on 08/01/2022 for management of confusion, weakness, and fatigue.  Patient has had recent hospitalization for management of GI bleeding secondary to prepyloric ulcer status post clipping.  During hospitalization, GI consulted for upper GI evaluation.  Patient underwent EGD on 08/02/2022 with findings of gastric antral vascular ectasia with bleeding status post APC, single nonbleeding angioectasia jejunum treated with APC.  On EGD on 7/6 it was noted that patient no longer has stent in place at ampullary opening for management of ampullary cancer and there was purulent drainage noted at site on EGD.  Palliative medicine team consulted to assist with complex medical decision making.  Extensive review of EMR prior to presenting to bedside. Discussed care with bedside RN for updates.  When presenting to bedside, patient sleeping comfortably in bed.  No family present at bedside.  Able to easily awaken patient and introduced myself as a member of the palliative medicine team my role in patient's care.  Spent time inquiring about patient's medical journey up into this point.  Patient able to note that he feels tired after everything he has gone through.  Inquired what brings the patient most joy outside of the hospital and he noted that was time with his family.  Able to inquire about what he was hoping for moving forward and he noted that his priority would be time with  family.  Also able to engage patient about CODE STATUS discussion.  Able to discuss the differences between full code and DNR.  When explaining CODE STATUS patient is able to easily state that if he is sick enough that his heart stops or he stops breathing, he does not want cardiac resuscitation or to be placed on mechanical ventilation.  Patient would want to be allowed to have a natural death.  Acknowledged patient's wishes and noted I will call his family to discuss this with them as well and he very much supported this.  Noted palliative medicine team will continue to follow along with his medical journey.  After visit with patient, able to call patient's son listed in advance care planning documents.  Son, Alessio, was able to place other son Tinnie Gens and Darrall Dears brake way to call with this provider.  Did inquire about patient's spouse though family stated that patient's spouse has severe dementia and is in a nursing facility.  Noted their father had given permission for me to talk to them about his care moving forward.  We were able to discuss medical journey up into this point.  Family has heard that there are not really further medical interventions that can be offered to stop patient's bleeding and that it will continue to happen.  Able to discuss how patient noted he wanted to spend time with family.  Discussed we need to review pathways moving forward for medical care including continuing with aggressive medical management versus transition to comfort focused care.  Family agreeing with conversation and wanting to meet with this provider at 10 AM  tomorrow. Was able to address CODE STATUS with patient's family as well.  Discussed what patient had told me about DNR CODE STATUS.  Family notes that patient had always "deferred" decision regarding medical care to them and they are supportive of change of CODE STATUS to DO NOT RESUSCITATE, continue appropriate medical interventions at this time.  Noted CODE  STATUS to be updated in EMR. All questions answered at that time.  Noted plan for palliative medicine provider to meet with patient's family at bedside tomorrow at 10 AM.  Primary Diagnoses  Present on Admission:  Hypervolemia  Symptomatic anemia  Atrial fibrillation (HCC)  GI bleeding  Acute blood loss anemia   Palliative Review of Systems: Overall tired  Past Medical History:  Diagnosis Date   Anal fistula    Arthritis    Fingers and hands   Atrial fibrillation (HCC)    AVM (arteriovenous malformation)    Clipped during Colonoscopy 05/2016   Barrett's esophagus    Bilateral cataracts    BPH (benign prostatic hyperplasia)    Cecal angiodysplasia 05/28/2016   ablated at colonoscopy   Deviated nasal septum    Diverticulosis of sigmoid colon    E. 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   Left Forehead (treatment after biopsy)   OSA on CPAP    Perianal rash    Recurrent epistaxis    Renal cyst 01/04/2013   Small left peripelvic renal cysts , noted on US Renal   SCCA (squamous cell carcinoma) of skin 10/17/2015   Left Sup Bridge of Nose (curet, cautery and 5FU)   Seasonal allergies    Superficial basal cell carcinoma (BCC) 10/17/2015   Left Bulb of Nose (curet, cautery and 5FU)   Tubular adenoma    Social History   Socioeconomic History   Marital status: Married    Spouse name: Not on file   Number of children: 3   Years of education: Not on file   Highest education level: Not on file  Occupational History   Occupation: retired  Tobacco Use   Smoking status: Former    Packs/day: 0.50    Years: 40.00    Additional pack years: 0.00    Total pack years: 20.00    Types: Cigarettes    Quit date: 2006    Years since quitting: 18.5   Smokeless tobacco: Never  Vaping Use   Vaping Use: Never used  Substance  and Sexual Activity   Alcohol use: Yes    Comment: occasional wine   Drug use: No   Sexual activity: Not on file  Other Topics Concern   Not on file  Social History Narrative   Not on file   Social Determinants of Health   Financial Resource Strain: Not on file  Food Insecurity: No Food Insecurity (07/08/2022)   Hunger Vital Sign    Worried About Running Out of Food in the Last Year: Never true    Ran Out of Food in the Last Year: Never true  Transportation Needs: Patient Declined (07/08/2022)   PRAPARE - Administrator, Civil Service (Medical): Patient declined    Lack of Transportation (Non-Medical): Patient declined  Physical Activity: Not on file  Stress: Not on file  Social Connections: Not on file   Family History  Problem  Relation Age of Onset   Heart disease Mother    Other Father        old age   Ovarian cancer Sister    Colon cancer Neg Hx    Stomach cancer Neg Hx    Rectal cancer Neg Hx    Esophageal cancer Neg Hx    Liver cancer Neg Hx    Scheduled Meds:  Chlorhexidine Gluconate Cloth  6 each Topical Daily   cholecalciferol  5,000 Units Oral QPM   insulin aspart  0-5 Units Subcutaneous QHS   insulin aspart  0-9 Units Subcutaneous TID WC   levothyroxine  112 mcg Oral Q0600   [START ON 08/05/2022] pantoprazole  40 mg Intravenous Q12H   sodium chloride flush  3 mL Intravenous Q12H   Continuous Infusions:  pantoprazole 8 mg/hr (08/02/22 1608)   piperacillin-tazobactam (ZOSYN)  IV     PRN Meds:.acetaminophen **OR** acetaminophen, polyethylene glycol Allergies  Allergen Reactions   Nsaids Other (See Comments)    Patient is to not take these because of kidney issues   CBC:    Component Value Date/Time   WBC 5.6 08/01/2022 1538   HGB 9.3 (L) 08/02/2022 1512   HGB 10.4 (L) 02/21/2022 0811   HCT 28.8 (L) 08/02/2022 1512   PLT 137 (L) 08/01/2022 1538   PLT 158 02/21/2022 0811   MCV 117.1 (H) 08/01/2022 1538   NEUTROABS 3.8 08/01/2022 1538    LYMPHSABS 1.1 08/01/2022 1538   MONOABS 0.6 08/01/2022 1538   EOSABS 0.1 08/01/2022 1538   BASOSABS 0.0 08/01/2022 1538   Comprehensive Metabolic Panel:    Component Value Date/Time   NA 145 08/02/2022 0311   K 4.4 08/02/2022 0311   CL 114 (H) 08/02/2022 0311   CO2 20 (L) 08/02/2022 0311   BUN 32 (H) 08/02/2022 0311   CREATININE 1.55 (H) 08/02/2022 0311   CREATININE 1.57 (H) 08/01/2022 1228   GLUCOSE 117 (H) 08/02/2022 0311   CALCIUM 8.3 (L) 08/02/2022 0311   AST 28 08/01/2022 1228   ALT 15 08/01/2022 1228   ALKPHOS 68 08/01/2022 1228   BILITOT 0.7 08/01/2022 1228   PROT 5.4 (L) 08/01/2022 1228   ALBUMIN 3.0 (L) 08/01/2022 1228    Physical Exam: Vital Signs: BP (!) 110/51   Pulse (!) 42   Temp (!) 96.3 F (35.7 C) (Axillary)   Resp (!) 22   Ht 5\' 8"  (1.727 m)   Wt 98 kg   SpO2 100%   BMI 32.85 kg/m  SpO2: SpO2: 100 % O2 Device: O2 Device: Room Air O2 Flow Rate: O2 Flow Rate (L/min): 4 L/min Intake/output summary:  Intake/Output Summary (Last 24 hours) at 08/02/2022 1726 Last data filed at 08/02/2022 1519 Gross per 24 hour  Intake 2218.38 ml  Output 0 ml  Net 2218.38 ml   LBM: Last BM Date : 08/01/22 Baseline Weight: Weight: 92 kg Most recent weight: Weight: 98 kg  General: NAD, laying comfortably in bed, chronically ill-appearing, pale Eyes: No drainage noted HENT: dry mucous membranes Cardiovascular: RRR,  Respiratory: no increased work of breathing noted, not in respiratory distress Neuro: Awake, pleasant, interactive          Palliative Performance Scale: 30%              Additional Data Reviewed: Recent Labs    08/01/22 1538 08/02/22 0311 08/02/22 1512  WBC 5.6  --   --   HGB 5.0* 7.0* 9.3*  PLT 137*  --   --  NA 143 145  --   BUN 32* 32*  --   CREATININE 1.53* 1.55*  --     Imaging: DG Abd Portable 2V CLINICAL DATA:  Biliary stent migration. EGD today. Previously placed biliary stent was not visible via scope.  EXAM: PORTABLE ABDOMEN  - 2 VIEW  COMPARISON:  CT 07/07/2022  FINDINGS: The previously seen biliary stent has migrated and is now located in the right lower quadrant. This could be within the distal small bowel or proximal colon. No free air, organomegaly, or bowel obstruction. No confluent opacity in the lungs.  IMPRESSION: Biliary stent has migrated into the right lower quadrant, possibly within the distal small bowel or proximal colon.  Electronically Signed   By: Charlett Nose M.D.   On: 08/02/2022 17:22    I personally reviewed recent imaging.   Palliative Care Assessment and Plan Summary of Established Goals of Care and Medical Treatment Preferences   Patient is an 89 year old male with a past medical history of paroxysmal A-fib, ampullary carcinoma, history of AVMs, sacral angiodysplasia, BPH, CKD stage III, history of diverticulosis, hypothyroidism, OSA, obesity, and BPH who was admitted on 08/01/2022 for management of confusion, weakness, and fatigue.  Patient has had recent hospitalization for management of GI bleeding secondary to prepyloric ulcer status post clipping.  During hospitalization, GI consulted for upper GI evaluation.  Patient underwent EGD on 08/02/2022 with findings of gastric antral vascular ectasia with bleeding status post APC, single nonbleeding angioectasia jejunum treated with APC.  On EGD on 7/6 it was noted that patient no longer has stent in place at ampullary opening for management of ampullary cancer and there was purulent drainage noted at site on EGD.  Palliative medicine team consulted to assist with complex medical decision making.  # Complex medical decision making/goals of care  -Discussed care with patient and 3 children over the phone as described above in HPI.  Patient noting that most important to him moving forward to spending time with family.  Patient has previously completed ACP documentation which was recently updated over [facility] which is not on file as per  patient's children.  Patient has been deferring to medical decisions to his 3 children.  Discussed possible pathways for medical care moving forward to 3 children via phone today.  At this time we will appropriately update CODE STATUS to DNR, continue appropriate medical care.  Planning to meet with patient's family tomorrow at 10 AM to further discuss pathways for medical care including continuing with aggressive medical care versus transitioning to comfort focused care.  -  Code Status: DNR   # Symptom management  -As per primary hospitalist   # Psycho-social/Spiritual Support:  - Support System: 2 sons, 2 daughter, wife- dementia in SNF  # Discharge Planning:  To Be Determined  Thank you for allowing the palliative care team to participate in the care [name].  [name], DO Palliative Care Provider PMT # [phone]  If patient remains symptomatic despite maximum doses, please call PMT at [phone] between 0700 and 1900. Outside of these hours, please call attending, as PMT does not have night coverage.  *Please note that this is a verbal dictation therefore any spelling or grammatical errors are due to the "[employer] One" system interpretation.

## 2022-08-02 NOTE — Op Note (Signed)
[facility] Patient Name: Carl Woods Procedure Date: 08/02/2022 MRN: [id number] Attending MD: Starr Lake. Myrtie Neither , MD, [id number] Date of Birth: Feb 05, 1931 CSN: [id number] Age: 89 Admit Type: Inpatient Procedure:                Upper GI endoscopy Indications:              Acute post hemorrhagic anemia, Melena, Suspected                            chronic gastric ulcer with hemorrhage                           Ampullary cancer status post ERCP with plastic                            stent placement, soon to have repeat ERCP for metal                            stent placement. On radiation treatment                           Recent gastric ulcer with endoscopic intervention.                            Admitted with acute on chronic anemia and altered                            mental status, see today's GI consult note for                            clinical details. Providers:                Starr Lake. Myrtie Neither, MD, Zoe Lan, RN, Melany Guernsey, Technician Referring MD:             [facility] hospitalist Medicines:                Monitored Anesthesia Care Complications:            No immediate complications. Estimated Blood Loss:     Estimated blood loss was minimal. Procedure:                Pre-Anesthesia Assessment:                           - Prior to the procedure, a History and Physical                            was performed, and patient medications and                            allergies were reviewed. The patient's tolerance of                            previous anesthesia was  also reviewed. The risks                            and benefits of the procedure and the sedation                            options and risks were discussed with the patient.                            All questions were answered, and informed consent                            was obtained. Prior Anticoagulants: The patient has                            taken no  anticoagulant or antiplatelet agents. ASA                            Grade Assessment: IV - A patient with severe                            systemic disease that is a constant threat to life.                            After reviewing the risks and benefits, the patient                            was deemed in satisfactory condition to undergo the                            procedure.                           After obtaining informed consent, the endoscope was                            passed under direct vision. Throughout the                            procedure, the patient's blood pressure, pulse, and                            oxygen saturations were monitored continuously. The                            [id number]-H190 ([id number]) Olympus endoscope was introduced                            through the mouth, and advanced to the second part                            of duodenum. The PCF-H190TL ([id number]) Olympus slim  colonoscope was introduced through the mouth, and                            advanced to the proximal jejunum. The upper GI                            endoscopy was accomplished without difficulty. The                            patient tolerated the procedure well. Scope In: Scope Out: Findings:      The esophagus was normal.      Gastric antral vascular ectasia-like findings with bleeding was present       in the gastric antrum. This is most likely the result of the patient's       radiation treatment and underlying cirrhosis. Spot coagulation for       hemostasis using argon plasma on friable areas with bleeding was       successful. There was a linear ulcer in the pyloric channel with friable       tissue.      Diffuse mucosal changes characterized by erythema, friability (with       contact bleeding) and granularity were found in the duodenal bulb and in       the first portion of the duodenum.      Mucosal changes characterized by nodularity  were found in the duodenal       bulb, particularly the inferior and posterior wall. Biopsies were taken       with a cold forceps for histology to rule out spread/invasion from his       malignancy.      A single diminutive angioectasia without bleeding was found in the       jejunum. Coagulation for hemostasis using argon plasma was successful.      There was clear bile in the stomach and second portion of the duodenum       with scant flecks of heme and small intermittent oozing of fresh blood       from the findings noted above. No findings with brisk bleeding.      In addition, it should be noted that the plastic stent was no longer in       place, and prolonged observation of the ampullary area revealed passage       of purulent bile. Impression:               - Normal esophagus.                           - Gastric antral vascular ectasia with bleeding.                            Treated with argon plasma coagulation (APC).                           - Mucosal changes in the duodenum.                           - Mucosal changes in the duodenum. Biopsied.                           -  A single non-bleeding angioectasia in the                            jejunum. Treated with argon plasma coagulation                            (APC).                           Probable cholangitis (see findings above) plastic                            biliary stent no longer in position.                           These gastric and proximal small bowel findings                            related to radiation treatment and underlying                            cirrhosis unfortunately have no permanent                            endoscopic solution, and there is a high chance of                            ongoing GI bleeding.                           Imaging suggest there may be some inflammation and                            thickening in the colon consistent with portal                             colopathy. Given the above findings, this patient                            could be bleeding from multiple possible sources                            nearly anywhere in his GI tract. His mental status                            and hemodynamic lability preclude preparation for                            and performing a colonoscopy. Moderate Sedation:      MAC sedation used Recommendation:           - Return patient to ICU for ongoing care.                           - Resume regular diet When mental status allows for  airway protection.                           - Continue present medications.                           - [facility] hospitalist will be contacted to recommend                            institution of Zosyn for suspected cholangitis                            given the purulent bile seen coming from the                            ampulla. This may also be contributing to the                            patient's altered mental status.                           Findings will be communicated to Dr. Meridee Score who                            was planning an outpatient ERCP later this month                            for stent exchange. If family wishes to continue                            aggressive care, that procedure should be done                            sooner.                           Address goals of care. Procedure Code(s):        --- Professional ---                           [phone], Small intestinal endoscopy, enteroscopy                            beyond second portion of duodenum, not including                            ileum; with control of bleeding (eg, injection,                            bipolar cautery, unipolar cautery, laser, heater                            probe, stapler, plasma coagulator)                           43255, 59, Esophagogastroduodenoscopy,  flexible,                            transoral; with control of  bleeding, any method                           43239, 59, Esophagogastroduodenoscopy, flexible,                            transoral; with biopsy, single or multiple Diagnosis Code(s):        --- Professional ---                           K31.811, Angiodysplasia of stomach and duodenum                            with bleeding                           K31.89, Other diseases of stomach and duodenum                           K55.20, Angiodysplasia of colon without hemorrhage                           D62, Acute posthemorrhagic anemia                           K92.1, Melena (includes Hematochezia) CPT copyright 2022 American Medical Association. All rights reserved. The codes documented in this report are preliminary and upon coder review may  be revised to meet current compliance requirements. Rabecca Birge L. Myrtie Neither, MD 08/02/2022 12:17:05 PM This report has been signed electronically. Number of Addenda: 0

## 2022-08-02 NOTE — Transfer of Care (Signed)
Immediate Anesthesia Transfer of Care Note  Patient: LJ SLIMAN  Procedure(s) Performed: ENTEROSCOPY HOT HEMOSTASIS (ARGON PLASMA COAGULATION/BICAP) BIOPSY  Patient Location: Endoscopy Unit  Anesthesia Type:MAC  Level of Consciousness: drowsy  Airway & Oxygen Therapy: Patient Spontanous Breathing and Patient connected to face mask  Post-op Assessment: Report given to RN and Post -op Vital signs reviewed and stable  Post vital signs: Reviewed and stable  Last Vitals:  Vitals Value Taken Time  BP    Temp    Pulse    Resp    SpO2      Last Pain:  Vitals:   08/02/22 1052  TempSrc: Temporal  PainSc:          Complications: No notable events documented.

## 2022-08-02 NOTE — Progress Notes (Signed)
Pharmacy Antibiotic Note  Carl Woods is a 89 y.o. male admitted on 08/01/2022 with  cholangitis .  Pharmacy has been consulted for piperacillin/tazobactam dosing.  Today, 08/02/22 WBC WNL SCr 1.55, CrCl 37 mL/min  Plan: Piperacillin/tazobactam 3.375 g IV q8h EI  Height: 5\' 8"  (172.7 cm) Weight: 98 kg (216 lb 0.4 oz) IBW/kg (Calculated) : 68.4  Temp (24hrs), Avg:97 F (36.1 C), Min:94.2 F (34.6 C), Max:98.1 F (36.7 C)  Recent Labs  Lab 08/01/22 1228 08/01/22 1538 08/02/22 0311  WBC  --  5.6  --   CREATININE 1.57* 1.53* 1.55*    Estimated Creatinine Clearance: 36.7 mL/min (A) (by C-G formula based on SCr of 1.55 mg/dL (H)).    Allergies  Allergen Reactions   Nsaids Other (See Comments)    Patient is to not take these because of kidney issues    Antimicrobials this admission: Piperacillin/tazobactam 7/6 >>   Dose adjustments this admission:  Microbiology results: 7/5 MRSA PCR:   Cindi Carbon, PharmD 08/02/2022 12:37 PM

## 2022-08-02 NOTE — Anesthesia Procedure Notes (Signed)
Procedure Name: MAC Date/Time: 08/02/2022 11:10 AM  Performed by: Vanessa [place], CRNAPre-anesthesia Checklist: Patient identified, Emergency Drugs available, Suction available and Patient being monitored Patient Re-evaluated:Patient Re-evaluated prior to induction Oxygen Delivery Method: Simple face mask

## 2022-08-02 NOTE — H&P (View-Only) (Signed)
Gastroenterology Inpatient Consultation   Attending Requesting Consult [name], Carl Woods, [facility] Day Hospital Day: 2  Reason for Consult Anemia and AMS with recent GI bleed    History of Present Illness  Carl Woods is a 89 y.o. 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  The GI service is consulted for evaluation and management of anemia with altered mental status.  The patient is unable to give any adequate clinical history.  This is obtained from the chart as well as speaking with the patient's daughter.  The patient was recently admitted in June for symptomatic anemia.  He had an upper endoscopy performed.  This showed evidence of a gastric ulcer with visible vessel which was treated.  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  After the discharge he was sent to rehab and he was discharged from rehab on Monday.  He had been doing well.  However when the daughter checked on him on Thursday he had become a little bit more lethargic and fatigued and as result of progressive changes in his mental status she brought him in on Friday.  CT head was negative.  He was found to have a drop in his hemoglobin to 5.0.  He has been transfused with a bump in his hemoglobin to 7.0.  Mild troponin leak is improved.  He is receiving another packed unit of RBCs.  This morning the patient is remaining altered and is unable to give any clinical history though he is quiet.  GI Review of Systems Unable to assess to due AMS   Review of Systems  Unable to assess to due AMS   Histories  Past Medical History Past Medical History:  Diagnosis Date   Anal fistula    Arthritis    Fingers and hands   Atrial fibrillation (HCC)    AVM (arteriovenous malformation)    Clipped during Colonoscopy 05/2016   Barrett's esophagus    Bilateral cataracts     BPH (benign prostatic hyperplasia)    Cecal angiodysplasia 05/28/2016   ablated at colonoscopy   Deviated nasal septum    Diverticulosis of sigmoid colon    E. 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   Left Forehead (treatment after biopsy)   OSA on CPAP    Perianal rash    Recurrent epistaxis    Renal cyst 01/04/2013   Small left peripelvic renal cysts , noted on US Renal   SCCA (squamous cell carcinoma) of skin 10/17/2015   Left Sup Bridge of Nose (curet, cautery and 5FU)   Seasonal allergies    Superficial basal cell carcinoma (BCC) 10/17/2015   Left Bulb of Nose (curet, cautery and 5FU)   Tubular adenoma    Past Surgical History:  Procedure Laterality Date   BILIARY STENT PLACEMENT N/A 01/23/2022   Procedure: BILIARY STENT PLACEMENT;  Surgeon: Lemar Lofty., MD;  Location: [facility];  Service: Gastroenterology;  Laterality: N/A;   BIOPSY  01/23/2022   Procedure: BIOPSY;  Surgeon: Meridee Score Netty Starring., MD;  Location: [facility];  Service: Gastroenterology;;   BIOPSY  07/09/2022   Procedure: BIOPSY;  Surgeon: Shellia Cleverly, DO;  Location: [facility];  Service: Gastroenterology;;   COLONOSCOPY  multiple   CYSTOSCOPY     ENDOSCOPIC RETROGRADE CHOLANGIOPANCREATOGRAPHY (ERCP) WITH PROPOFOL N/A 01/23/2022   Procedure: ENDOSCOPIC RETROGRADE CHOLANGIOPANCREATOGRAPHY (ERCP) WITH PROPOFOL;  Surgeon: Meridee Score Netty Starring., MD;  Location: [facility];  Service: Gastroenterology;  Laterality: N/A;   ESOPHAGOGASTRODUODENOSCOPY  multiple   ESOPHAGOGASTRODUODENOSCOPY N/A 01/23/2022   Procedure: ESOPHAGOGASTRODUODENOSCOPY (EGD);  Surgeon: Lemar Lofty., MD;  Location: [facility];  Service: Gastroenterology;  Laterality: N/A;   ESOPHAGOGASTRODUODENOSCOPY N/A 07/09/2022   Procedure:  ESOPHAGOGASTRODUODENOSCOPY (EGD);  Surgeon: Shellia Cleverly, DO;  Location: [facility];  Service: Gastroenterology;  Laterality: N/A;   EUS N/A 01/23/2022   Procedure: UPPER ENDOSCOPIC ULTRASOUND (EUS) RADIAL;  Surgeon: Lemar Lofty., MD;  Location: [facility];  Service: Gastroenterology;  Laterality: N/A;   EVALUATION UNDER ANESTHESIA WITH FISTULECTOMY N/A 03/02/2018   Procedure: ANORECTAL EXAM UNDER ANESTHESIA WITH REPAIR OF SUPERFICIAL PERIRECTAL FISTULA AND HEMORRHOIDECTOMY;  Surgeon: [name], MD;  Location: [facility];  Service: General;  Laterality: N/A;   EXPLORATORY LAPAROTOMY     HEMOSTASIS CLIP PLACEMENT  07/09/2022   Procedure: HEMOSTASIS CLIP PLACEMENT;  Surgeon: Shellia Cleverly, DO;  Location: [facility];  Service: Gastroenterology;;   IR THORACENTESIS ASP PLEURAL SPACE W/IMG GUIDE  07/10/2022   REMOVAL OF STONES  01/23/2022   Procedure: REMOVAL OF STONES;  Surgeon: Lemar Lofty., MD;  Location: [facility];  Service: Gastroenterology;;   Dennison Mascot  01/23/2022   Procedure: Dennison Mascot;  Surgeon: Mansouraty, Netty Starring., MD;  Location: [facility];  Service: Gastroenterology;;    Allergies Allergies  Allergen Reactions   Nsaids Other (See Comments)    Patient is to not take these because of kidney issues    Family History Family History  Problem Relation Age of Onset   Heart disease Mother    Other Father        old age   Ovarian cancer Sister    Colon cancer Neg Hx    Stomach cancer Neg Hx    Rectal cancer Neg Hx    Esophageal cancer Neg Hx    Liver cancer Neg Hx    The patient's FH is negative for IBD/IBS/Liver Disease/GI Malignancies.  Social History Social History   Socioeconomic History   Marital status: Married    Spouse name: Not on file   Number of children: 3   Years of education: Not on file   Highest education level: Not on file  Occupational History   Occupation: retired  Tobacco Use   Smoking status:  Former    Packs/day: 0.50    Years: 40.00    Additional pack years: 0.00    Total pack years: 20.00    Types: Cigarettes    Quit date: 2006    Years since quitting: 18.5   Smokeless tobacco: Never  Vaping Use   Vaping Use: Never used  Substance and Sexual Activity   Alcohol use: Yes    Comment: occasional wine   Drug use: No   Sexual activity: Not on file  Other Topics Concern   Not on file  Social History Narrative   Not on file   Social Determinants of Health   Financial Resource Strain: Not on file  Food Insecurity: No Food Insecurity (07/08/2022)   Hunger Vital Sign    Worried About Running Out of Food in the Last Year: Never true    Ran Out of Food in the Last Year: Never true  Transportation Needs: Patient Declined (07/08/2022)   PRAPARE -  Administrator, Civil Service (Medical): Patient declined    Lack of Transportation (Non-Medical): Patient declined  Physical Activity: Not on file  Stress: Not on file  Social Connections: Not on file  Intimate Partner Violence: Patient Declined (07/08/2022)   Humiliation, Afraid, Rape, and Kick questionnaire    Fear of Current or Ex-Partner: Patient declined    Emotionally Abused: Patient declined    Physically Abused: Patient declined    Sexually Abused: Patient declined   The patient denies Drug, Alcohol, or Tobacco use.   Occupation is Travel   Medications  Home Medications No current facility-administered medications on file prior to encounter.   Current Outpatient Medications on File Prior to Encounter  Medication Sig Dispense Refill   bumetanide (BUMEX) 1 MG tablet Take 1 tablet (1 mg total) by mouth daily.     Cholecalciferol (VITAMIN D3) 5000 units CAPS Take 5,000 Units by mouth every evening.     dapagliflozin propanediol (FARXIGA) 10 MG TABS tablet Take 10 mg by mouth in the morning.     Fe Fum-Fe Poly-Vit C-Lactobac (FUSION) 65-65-25-30 MG CAPS Take 1 capsule by mouth every Monday, Wednesday, and  Friday.     fexofenadine (ALLEGRA) 180 MG tablet Take 180 mg by mouth daily as needed for allergies or rhinitis.     Glucosamine Sulfate 500 MG TABS Take 500 mg by mouth daily.     Glucosamine-Chondroit-Vit C-Mn (GLUCOSAMINE CHONDR 500 COMPLEX PO) Take 1 tablet by mouth daily.      ketoconazole (NIZORAL) 2 % cream Apply 1 Application topically 2 (two) times daily as needed (skin rash/irritation- groin area).     levothyroxine (SYNTHROID, LEVOTHROID) 112 MCG tablet Take 112 mcg by mouth daily before breakfast.      metoprolol succinate (TOPROL-XL) 25 MG 24 hr tablet Take 0.5 tablets (12.5 mg total) by mouth daily. Take with or immediately following a meal. 30 tablet 0   nitroGLYCERIN (NITROSTAT) 0.4 MG SL tablet Place 1 tablet (0.4 mg total) under the tongue every 5 (five) minutes as needed for up to 25 days for chest pain. 25 tablet 3   olopatadine (PATANOL) 0.1 % ophthalmic solution Place 1 drop into both eyes daily as needed for allergies.     pantoprazole (PROTONIX) 40 MG tablet Take 1 tablet (40 mg total) by mouth 2 (two) times daily.     potassium chloride SA (KLOR-CON M) 20 MEQ tablet Take 1 tablet (20 mEq total) by mouth 2 (two) times daily. (Patient taking differently: Take 20 mEq by mouth daily.) 60 tablet 0   sodium chloride (OCEAN) 0.65 % SOLN nasal spray Place 1 spray into both nostrils as needed for congestion.     sucralfate (CARAFATE) 1 GM/10ML suspension Take 10 mLs (1 g total) by mouth 4 (four) times daily -  with meals and at bedtime. 420 mL 0   Scheduled Inpatient Medications  sodium chloride   Intravenous Once   Chlorhexidine Gluconate Cloth  6 each Topical Daily   cholecalciferol  5,000 Units Oral QPM   insulin aspart  0-5 Units Subcutaneous QHS   insulin aspart  0-9 Units Subcutaneous TID WC   levothyroxine  112 mcg Oral Q0600   [START ON 08/05/2022] pantoprazole  40 mg Intravenous Q12H   sodium chloride flush  3 mL Intravenous Q12H   Continuous Inpatient Infusions   pantoprazole 8 mg/hr (08/01/22 2135)   PRN Inpatient Medications acetaminophen **OR** acetaminophen, polyethylene glycol   Physical Examination  BP (!) 116/41  Pulse 82   Temp (!) 96 F (35.6 C) (Axillary)   Resp (!) 24   Ht 5\' 8"  (1.727 m)   Wt 92 kg   SpO2 90%   BMI 30.84 kg/m  GEN: Resting in bed, moving about and shrugging along, appears stated age, appears chronically ill but is nontoxic  PSYCH: Not able to assess due to his mental status EYE: Conjunctivae pale-pink ENT: Dry MM CV: Nontachycardic RESP: No audible wheezing GI: NABS, soft, protuberant abdomen, ventral diastases palpated, does not seem to be agitated by palpation of his abdomen, no rebound  MSK/EXT: Bilateral pedal edema present SKIN: No jaundice NEURO: Unable to assess due to mental status   Review of Data  I reviewed the following data at the time of this encounter:  Laboratory Studies   Recent Labs  Lab 08/02/22 0311  NA 145  K 4.4  CL 114*  CO2 20*  BUN 32*  CREATININE 1.55*  GLUCOSE 117*  CALCIUM 8.3*   Recent Labs  Lab 08/01/22 1228  AST 28  ALT 15  ALKPHOS 68    Recent Labs  Lab 08/01/22 1538 08/02/22 0311  WBC 5.6  --   HGB 5.0* 7.0*  HCT 16.4* 22.0*  PLT 137*  --    Recent Labs  Lab 08/01/22 1538 08/02/22 0311  APTT  --  25  INR 1.2  --    Imaging Studies  6/24 CTAbdomen IMPRESSION: 1. Hepatic cirrhosis. No focal lesions. The contrast bolus is somewhat limited but no obvious portal venous thrombosis is demonstrated in the central portal veins appear to have flow. 2. Gallbladder wall edema is likely due to liver disease. Bile duct stent with pneumobilia. 3. 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 4. Diffuse edema demonstrated in the subcutaneous fat, mesentery, and intra peritoneum. 5. Aortic atherosclerosis. 6. Small supraumbilical hernia with fat herniation and fat necrosis. 7. Large  bilateral pleural effusions with basilar atelectasis or consolidation.  7/24 CTHead IMPRESSION: No acute abnormality noted.   GI Procedures and Studies  6/24 EGD - Normal esophagus. - Z-line regular, 45 cm from the incisors. - A few gastric polyps. - Erythematous mucosa in the prepyloric region of the stomach. Endoscopically, this appeared suspicious for radiation gastritis. Biopsied. - Non-bleeding gastric ulcer with a visible vessel. Clip (MR conditional) was placed. - Erythematous duodenopathy. - Duodenal foreign body. - Normal second portion of the duodenum and third portion of the duodenum.   Assessment  Carl Woods is a 89 y.o. 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  The GI service is consulted for evaluation and management of anemia with altered mental status.  The patient is hemodynamically stable this morning.  Clinically, he has a presentation that is most concerning for persisting GI bleeding in the setting of a higher risk lesion noted on endoscopy 3 weeks ago and his issues progressing over the course of the last few days, I am most concerned that he has a slow ooze or bleed from the area that was previously worked on.  He does have other indications and previous imaging that is suggested diverticulosis as well as some thickening within the colon.  Not sure we need to put him through a colonoscopy at this point and I would begin his evaluation with an upper endoscopy.  His hemoglobin is already up to  7 and we will try to add on for EGD today with anesthesia assistance.  This will be performed by Dr. Myrtie Neither or myself.  The risks and benefits of endoscopic evaluation were discussed with the patient; these include but are not limited to the risk of perforation, infection, bleeding, missed lesions, lack of diagnosis, severe illness requiring  hospitalization, as well as anesthesia and sedation related illnesses.  The patient and/or family is agreeable to proceed.  I will proceed later this month with biliary stent exchange to evaluate self-expanding metal stent so that we can no longer have to worry about his biliary tree if possible in the setting of his known ampullary cancer.  This will not be pursued this weekend.  All patient family questions were answered to the best of my ability, and the patient agrees to the aforementioned plan of action with follow-up as indicated.   Plan/Recommendations  Remain n.p.o. Hemoglobin goal greater than 7 - Agree with the extra unit of packed RBCs that is going in for the hemoglobin of 7 this morning Giving 10 mg IV Reglan this morning to ensure a clear stomach Proceed with endoscopy today, pending anesthesia availability Continue IV PPI If EGD is unremarkable, then we will need to consider colonoscopy   Thank you for this consult.  We will continue to follow.  Please page/call with questions or concerns.   Corliss Parish, MD  Gastroenterology Advanced Endoscopy Office # [phone]

## 2022-08-02 NOTE — Interval H&P Note (Signed)
History and Physical Interval Note:  08/02/2022 10:55 AM  Carl Woods  has presented today for surgery, with the diagnosis of GI bleed.  The various methods of treatment have been discussed with the patient and family. After consideration of risks, benefits and other options for treatment, the patient has consented to  Procedure(s): ESOPHAGOGASTRODUODENOSCOPY (EGD) (N/A) as a surgical intervention.  The patient's history has been reviewed, patient examined, no change in status, stable for surgery.  I have reviewed the patient's chart and labs.  Questions were answered to the patient's satisfaction.    Case discussed with Dr. Meridee Score, chart reviewed.  Currently receiving second of 2 units PRBCs today.  Discussed case with anesthesia provider and endoscopy staff.  Proceed with upper endoscopy for localization and endoscopic treatment of upper GI bleeding. [name]

## 2022-08-02 NOTE — Progress Notes (Addendum)
PROGRESS NOTE    SHEPHARD GELMAN  ZOX:[id number] DOB: [birth year] DOA: 08/01/2022 PCP: [name], MD    Brief Narrative:   Carl Woods is a 89 y.o. male with past medical history significant for paroxysmal atrial fibrillation, ampullary carcinoma, history of AVMs, sacral angiodysplasia, BPH, CKD stage IIIb, history of diverticulosis, hypothyroidism, OSA, obesity, BPH and recent hospitalization for GI bleed secondary to prepyloric ulcer s/p clipping who presented to [facility] ED on 7/5 from home for altered mental status.  Patient recently discharged from SNF rehab Monday in which she is currently living at home alone.  Family noted that he was becoming more confused, weak and drowsy, fatigue on Thursday.  Daughter also noted patient with black stools.  Patient apparently has been off of his Eliquis since previous hospitalization.  In the ED, patient was lethargic, confused; slightly arousable but falls back asleep.  Temperature 98.0 F, HR 93, RR 15, BP 130/49, SpO2 100% on room air.  WBC 5.6, hemoglobin 5.0, MCV 117.1, platelet count 137.  Sodium 143, potassium 4.1, chloride 114, CO2 21, glucose 135, BUN 32, creatinine 1.53.  High sensitive troponin 51 followed by 43.  Urinalysis unrevealing.  FOBT positive.  CT head without contrast with no acute intracranial abnormality.  GI was consulted.  2 unit PRBCs ordered.  TRH consulted for admission for further evaluation and management of recurrent GI bleed.  Assessment & Plan:   Acute metabolic encephalopathy Etiology likely secondary to significant anemia secondary to GI bleed versus infectious with cholangitis noted with purulent bile on EGD.  CT head without contrast with no acute findings. 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  Hemoglobin noted to be 5.0 with MCV of 117.1.  FOBT positive.  GI was consulted and patient underwent EGD on 08/02/2022 with findings of gastric antral vascular ectasia with bleeding s/p APC, single nonbleeding angiectasia jejunum treated with APC. -- Hgb 5.0>7.0 -- s/p 2 u pRBC 7/5 -- Transfused 2 additional PRBCs today -- Check anemia panel -- Continue Protonix drip -- Repeat hemoglobin 2 hours following transfusion and monitor serially -- CBC daily  Hx ampullary cancer s/p ERCP with plastic stent placement Cholangitis Patient with previous ERCP with plastic stent placement was planned to have repeat ERCP soon for metal stent placement.  On EGD 7/6; plastic stent no longer visualized and prolonged observation of the ampullary area revealed passage of purulent bile. -- Abdominal x-ray ordered to assess for stent migration -- Start Zosyn -- CBC daily  Hypothyroidism -- Levothyroxine 112 mcg p.o. daily  Essential hypertension At baseline on metoprolol succinate 12.5 mg p.o. daily, Bumex 1 mg p.o. daily. --Borderline hypotensive, hold antihypertensives for now -- Continue monitor BP closely  CKD stage IIIb Creatinine 1.55, stable. --BMP daily  BPH Not on medication outpatient. --Continue monitor urine output  Obesity Body mass index is 32.85 kg/m.  Discussed with patient needs for aggressive lifestyle changes/weight loss as this complicates all facets of care.  Outpatient follow-up with PCP.    Goals of care: Patient now with recurrent hospitalization, per GI; Dr. Myrtie Neither may not have more to offer this patient unfortunately.  Overall prognosis poor.  Consulting palliative care for assistance with goals of care and medical decision making.   DVT prophylaxis: SCDs Start: 08/01/22 2125    Code  Status: Full Code Family Communication: No family present at bedside this morning  Disposition Plan:  Level of care: Stepdown Status is:  Inpatient Remains inpatient appropriate because: IV antibiotics, needs further blood transfusion    Consultants:  [facility]  Procedures:  EGD  Antimicrobials:  Zosyn 7/6>>   Subjective: Patient seen examined at bedside, resting calmly.  Lying in bed.  RN present at bedside.  Remains alert but confused.  No family present at bedside.  Hemoglobin up to 7.0, will transfuse an additional 2 units today.  Pending EGD this morning.  Discussed with GI, Dr. Myrtie Neither.  Unable to obtain any further ROS due to his current mental status.  No acute concerns this morning per nursing staff.  Objective: Vitals:   08/02/22 1000 08/02/22 1010 08/02/22 1052 08/02/22 1155  BP: (!) 103/26  (!) 106/52 (!) 105/32  Pulse: (!) 56 78    Resp: 19 18 20 19   Temp:  (!) 96.1 F (35.6 C) (!) 96.4 F (35.8 C)   TempSrc:  Axillary Temporal   SpO2:  99% 100% 100%  Weight:   98 kg   Height:   5\' 8"  (1.727 m)     Intake/Output Summary (Last 24 hours) at 08/02/2022 1231 Last data filed at 08/02/2022 1149 Gross per 24 hour  Intake 1663.37 ml  Output 0 ml  Net 1663.37 ml   Filed Weights   08/01/22 1402 08/02/22 1052  Weight: 92 kg 98 kg    Examination:  Physical Exam: GEN: NAD, alert, confused, chronically ill in appearance HEENT: NCAT, PERRL, EOMI, sclera clear, dry mucous membranes PULM: CTAB w/o wheezes/crackles, normal respiratory effort, on room air CV: RRR w/o M/G/R GI: abd soft, NTND, NABS, no R/G/M MSK: 2+ pitting edema bilateral lower extremities up to mid shin, chronic venous changes/skin changes noted to bilateral lower extremities, moves all extremities independently NEURO: No focal neurological deficits appreciated Integumentary: No concerning rashes/lesions/wounds noted on exposed skin surfaces.    Data Reviewed: I have personally reviewed following labs and imaging studies  CBC: Recent Labs  Lab 08/01/22 1538 08/02/22 0311  WBC 5.6  --   NEUTROABS 3.8  --   HGB 5.0*  7.0*  HCT 16.4* 22.0*  MCV 117.1*  --   PLT 137*  --    Basic Metabolic Panel: Recent Labs  Lab 08/01/22 1228 08/01/22 1538 08/02/22 0311  NA 145 143 145  K 4.2 4.1 4.4  CL 113* 114* 114*  CO2 21* 21* 20*  GLUCOSE 128* 135* 117*  BUN 30* 32* 32*  CREATININE 1.57* 1.53* 1.55*  CALCIUM 8.7* 8.3* 8.3*   GFR: Estimated Creatinine Clearance: 36.7 mL/min (A) (by C-G formula based on SCr of 1.55 mg/dL (H)). Liver Function Tests: Recent Labs  Lab 08/01/22 1228  AST 28  ALT 15  ALKPHOS 68  BILITOT 0.7  PROT 5.4*  ALBUMIN 3.0*   No results for input(s): "LIPASE", "AMYLASE" in the last 168 hours. No results for input(s): "AMMONIA" in the last 168 hours. Coagulation Profile: Recent Labs  Lab 08/01/22 1538  INR 1.2   Cardiac Enzymes: No results for input(s): "CKTOTAL", "CKMB", "CKMBINDEX", "TROPONINI" in the last 168 hours. BNP (last 3 results) No results for input(s): "PROBNP" in the last 8760 hours. HbA1C: Recent Labs    08/02/22 0311  HGBA1C 4.4*   CBG: Recent Labs  Lab 08/02/22 0751  GLUCAP 106*   Lipid Profile: No results for input(s): "CHOL", "HDL", "LDLCALC", "TRIG", "CHOLHDL", "LDLDIRECT" in the last 72 hours.  Thyroid Function Tests: No results for input(s): "TSH", "T4TOTAL", "FREET4", "T3FREE", "THYROIDAB" in the last 72 hours. Anemia Panel: Recent Labs    08/01/22 1229  FERRITIN 105   Sepsis Labs: No results for input(s): "PROCALCITON", "LATICACIDVEN" in the last 168 hours.  Recent Results (from the past 240 hour(s))  MRSA Next Gen by PCR, Nasal     Status: None   Collection Time: 08/01/22 11:17 PM   Specimen: Nasal Mucosa; Nasal Swab  Result Value Ref Range Status   MRSA by PCR Next Gen NOT DETECTED NOT DETECTED Final    Comment: (NOTE) The GeneXpert MRSA Assay (FDA approved for NASAL specimens only), is one component of a comprehensive MRSA colonization surveillance program. It is not intended to diagnose MRSA infection nor to guide or  monitor treatment for MRSA infections. Test performance is not FDA approved in patients less than 66 years old. Performed at [facility], [address], [place], [place] [address]          Radiology Studies: CT Head Wo Contrast  Result Date: 08/01/2022 CLINICAL DATA:  Altered mental status EXAM: CT HEAD WITHOUT CONTRAST TECHNIQUE: Contiguous axial images were obtained from the base of the skull through the vertex without intravenous contrast. RADIATION DOSE REDUCTION: This exam was performed according to the departmental dose-optimization program which includes automated exposure control, adjustment of the mA and/or kV according to patient size and/or use of iterative reconstruction technique. COMPARISON:  None Available. FINDINGS: Brain: No evidence of acute infarction, hemorrhage, hydrocephalus, extra-axial collection or mass lesion/mass effect. Vascular: No hyperdense vessel or unexpected calcification. Skull: Normal. Negative for fracture or focal lesion. Sinuses/Orbits: No acute finding. Other: None. IMPRESSION: No acute abnormality noted. Electronically Signed   By: Alcide Clever M.D.   On: 08/01/2022 16:15        Scheduled Meds:  Chlorhexidine Gluconate Cloth  6 each Topical Daily   cholecalciferol  5,000 Units Oral QPM   insulin aspart  0-5 Units Subcutaneous QHS   insulin aspart  0-9 Units Subcutaneous TID WC   levothyroxine  112 mcg Oral Q0600   [START ON 08/05/2022] pantoprazole  40 mg Intravenous Q12H   sodium chloride flush  3 mL Intravenous Q12H   Continuous Infusions:  pantoprazole 8 mg/hr (08/02/22 1105)     LOS: 1 day    Time spent: 56 minutes spent on chart review, discussion with nursing staff, consultants, updating family and interview/physical exam; more than 50% of that time was spent in counseling and/or coordination of care.    [name] [name], DO [facility] Available via Epic secure chat 7am-7pm After these hours, please  refer to coverage provider listed on [email or web address] 08/02/2022, 12:31 PM

## 2022-08-02 NOTE — Consult Note (Signed)
Gastroenterology Inpatient Consultation   Attending Requesting Consult [name], [name], [facility] Day: 2  Reason for Consult Anemia and AMS with recent GI bleed    History of Present Illness  Carl Woods is a 89 y.o. 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  The GI service is consulted for evaluation and management of anemia with altered mental status.  The patient is unable to give any adequate clinical history.  This is obtained from the chart as well as speaking with the patient's daughter.  The patient was recently admitted in June for symptomatic anemia.  He had an upper endoscopy performed.  This showed evidence of a gastric ulcer with visible vessel which was treated.  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  After the discharge he was sent to rehab and he was discharged from rehab on Monday.  He had been doing well.  However when the daughter checked on him on Thursday he had become a little bit more lethargic and fatigued and as result of progressive changes in his mental status she brought him in on Friday.  CT head was negative.  He was found to have a drop in his hemoglobin to 5.0.  He has been transfused with a bump in his hemoglobin to 7.0.  Mild troponin leak is improved.  He is receiving another packed unit of RBCs.  This morning the patient is remaining altered and is unable to give any clinical history though he is quiet.  GI Review of Systems Unable to assess to due AMS   Review of Systems  Unable to assess to due AMS   Histories  Past Medical History Past Medical History:  Diagnosis Date   Anal fistula    Arthritis    Fingers and hands   Atrial fibrillation (HCC)    AVM (arteriovenous malformation)    Clipped during Colonoscopy 05/2016   Barrett's esophagus    Bilateral cataracts     BPH (benign prostatic hyperplasia)    Cecal angiodysplasia 05/28/2016   ablated at colonoscopy   Deviated nasal septum    Diverticulosis of sigmoid colon    E. 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   Left Forehead (treatment after biopsy)   OSA on CPAP    Perianal rash    Recurrent epistaxis    Renal cyst 01/04/2013   Small left peripelvic renal cysts , noted on US Renal   SCCA (squamous cell carcinoma) of skin 10/17/2015   Left Sup Bridge of Nose (curet, cautery and 5FU)   Seasonal allergies    Superficial basal cell carcinoma (BCC) 10/17/2015   Left Bulb of Nose (curet, cautery and 5FU)   Tubular adenoma    Past Surgical History:  Procedure Laterality Date   BILIARY STENT PLACEMENT N/A 01/23/2022   Procedure: BILIARY STENT PLACEMENT;  Surgeon: Lemar Lofty., MD;  Location: [facility];  Service: Gastroenterology;  Laterality: N/A;   BIOPSY  01/23/2022   Procedure: BIOPSY;  Surgeon: Meridee Score Netty Starring., MD;  Location: [facility];  Service: Gastroenterology;;   BIOPSY  07/09/2022   Procedure: BIOPSY;  Surgeon: Shellia Cleverly, DO;  Location: [facility];  Service: Gastroenterology;;   COLONOSCOPY  multiple   CYSTOSCOPY     ENDOSCOPIC RETROGRADE CHOLANGIOPANCREATOGRAPHY (ERCP) WITH PROPOFOL N/A 01/23/2022   Procedure: ENDOSCOPIC RETROGRADE CHOLANGIOPANCREATOGRAPHY (ERCP) WITH PROPOFOL;  Surgeon: Meridee Score Netty Starring., MD;  Location: [facility];  Service: Gastroenterology;  Laterality: N/A;   ESOPHAGOGASTRODUODENOSCOPY  multiple   ESOPHAGOGASTRODUODENOSCOPY N/A 01/23/2022   Procedure: ESOPHAGOGASTRODUODENOSCOPY (EGD);  Surgeon: Lemar Lofty., MD;  Location: [facility];  Service: Gastroenterology;  Laterality: N/A;   ESOPHAGOGASTRODUODENOSCOPY N/A 07/09/2022   Procedure:  ESOPHAGOGASTRODUODENOSCOPY (EGD);  Surgeon: Shellia Cleverly, DO;  Location: [facility];  Service: Gastroenterology;  Laterality: N/A;   EUS N/A 01/23/2022   Procedure: UPPER ENDOSCOPIC ULTRASOUND (EUS) RADIAL;  Surgeon: Lemar Lofty., MD;  Location: [facility];  Service: Gastroenterology;  Laterality: N/A;   EVALUATION UNDER ANESTHESIA WITH FISTULECTOMY N/A 03/02/2018   Procedure: ANORECTAL EXAM UNDER ANESTHESIA WITH REPAIR OF SUPERFICIAL PERIRECTAL FISTULA AND HEMORRHOIDECTOMY;  Surgeon: [name], MD;  Location: [facility];  Service: General;  Laterality: N/A;   EXPLORATORY LAPAROTOMY     HEMOSTASIS CLIP PLACEMENT  07/09/2022   Procedure: HEMOSTASIS CLIP PLACEMENT;  Surgeon: Shellia Cleverly, DO;  Location: [facility];  Service: Gastroenterology;;   IR THORACENTESIS ASP PLEURAL SPACE W/IMG GUIDE  07/10/2022   REMOVAL OF STONES  01/23/2022   Procedure: REMOVAL OF STONES;  Surgeon: Lemar Lofty., MD;  Location: [facility];  Service: Gastroenterology;;   Dennison Mascot  01/23/2022   Procedure: Dennison Mascot;  Surgeon: Mansouraty, Netty Starring., MD;  Location: [facility];  Service: Gastroenterology;;    Allergies Allergies  Allergen Reactions   Nsaids Other (See Comments)    Patient is to not take these because of kidney issues    Family History Family History  Problem Relation Age of Onset   Heart disease Mother    Other Father        old age   Ovarian cancer Sister    Colon cancer Neg Hx    Stomach cancer Neg Hx    Rectal cancer Neg Hx    Esophageal cancer Neg Hx    Liver cancer Neg Hx    The patient's FH is negative for IBD/IBS/Liver Disease/GI Malignancies.  Social History Social History   Socioeconomic History   Marital status: Married    Spouse name: Not on file   Number of children: 3   Years of education: Not on file   Highest education level: Not on file  Occupational History   Occupation: retired  Tobacco Use   Smoking status:  Former    Packs/day: 0.50    Years: 40.00    Additional pack years: 0.00    Total pack years: 20.00    Types: Cigarettes    Quit date: 2006    Years since quitting: 18.5   Smokeless tobacco: Never  Vaping Use   Vaping Use: Never used  Substance and Sexual Activity   Alcohol use: Yes    Comment: occasional wine   Drug use: No   Sexual activity: Not on file  Other Topics Concern   Not on file  Social History Narrative   Not on file   Social Determinants of Health   Financial Resource Strain: Not on file  Food Insecurity: No Food Insecurity (07/08/2022)   Hunger Vital Sign    Worried About Running Out of Food in the Last Year: Never true    Ran Out of Food in the Last Year: Never true  Transportation Needs: Patient Declined (07/08/2022)   PRAPARE -  Administrator, Civil Service (Medical): Patient declined    Lack of Transportation (Non-Medical): Patient declined  Physical Activity: Not on file  Stress: Not on file  Social Connections: Not on file  Intimate Partner Violence: Patient Declined (07/08/2022)   Humiliation, Afraid, Rape, and Kick questionnaire    Fear of Current or Ex-Partner: Patient declined    Emotionally Abused: Patient declined    Physically Abused: Patient declined    Sexually Abused: Patient declined   The patient denies Drug, Alcohol, or Tobacco use.   Occupation is Travel   Medications  Home Medications No current facility-administered medications on file prior to encounter.   Current Outpatient Medications on File Prior to Encounter  Medication Sig Dispense Refill   bumetanide (BUMEX) 1 MG tablet Take 1 tablet (1 mg total) by mouth daily.     Cholecalciferol (VITAMIN D3) 5000 units CAPS Take 5,000 Units by mouth every evening.     dapagliflozin propanediol (FARXIGA) 10 MG TABS tablet Take 10 mg by mouth in the morning.     Fe Fum-Fe Poly-Vit C-Lactobac (FUSION) 65-65-25-30 MG CAPS Take 1 capsule by mouth every Monday, Wednesday, and  Friday.     fexofenadine (ALLEGRA) 180 MG tablet Take 180 mg by mouth daily as needed for allergies or rhinitis.     Glucosamine Sulfate 500 MG TABS Take 500 mg by mouth daily.     Glucosamine-Chondroit-Vit C-Mn (GLUCOSAMINE CHONDR 500 COMPLEX PO) Take 1 tablet by mouth daily.      ketoconazole (NIZORAL) 2 % cream Apply 1 Application topically 2 (two) times daily as needed (skin rash/irritation- groin area).     levothyroxine (SYNTHROID, LEVOTHROID) 112 MCG tablet Take 112 mcg by mouth daily before breakfast.      metoprolol succinate (TOPROL-XL) 25 MG 24 hr tablet Take 0.5 tablets (12.5 mg total) by mouth daily. Take with or immediately following a meal. 30 tablet 0   nitroGLYCERIN (NITROSTAT) 0.4 MG SL tablet Place 1 tablet (0.4 mg total) under the tongue every 5 (five) minutes as needed for up to 25 days for chest pain. 25 tablet 3   olopatadine (PATANOL) 0.1 % ophthalmic solution Place 1 drop into both eyes daily as needed for allergies.     pantoprazole (PROTONIX) 40 MG tablet Take 1 tablet (40 mg total) by mouth 2 (two) times daily.     potassium chloride SA (KLOR-CON M) 20 MEQ tablet Take 1 tablet (20 mEq total) by mouth 2 (two) times daily. (Patient taking differently: Take 20 mEq by mouth daily.) 60 tablet 0   sodium chloride (OCEAN) 0.65 % SOLN nasal spray Place 1 spray into both nostrils as needed for congestion.     sucralfate (CARAFATE) 1 GM/10ML suspension Take 10 mLs (1 g total) by mouth 4 (four) times daily -  with meals and at bedtime. 420 mL 0   Scheduled Inpatient Medications  sodium chloride   Intravenous Once   Chlorhexidine Gluconate Cloth  6 each Topical Daily   cholecalciferol  5,000 Units Oral QPM   insulin aspart  0-5 Units Subcutaneous QHS   insulin aspart  0-9 Units Subcutaneous TID WC   levothyroxine  112 mcg Oral Q0600   [START ON 08/05/2022] pantoprazole  40 mg Intravenous Q12H   sodium chloride flush  3 mL Intravenous Q12H   Continuous Inpatient Infusions   pantoprazole 8 mg/hr (08/01/22 2135)   PRN Inpatient Medications acetaminophen **OR** acetaminophen, polyethylene glycol   Physical Examination  BP (!) 116/41  Pulse 82   Temp (!) 96 F (35.6 C) (Axillary)   Resp (!) 24   Ht 5\' 8"  (1.727 m)   Wt 92 kg   SpO2 90%   BMI 30.84 kg/m  GEN: Resting in bed, moving about and shrugging along, appears stated age, appears chronically ill but is nontoxic  PSYCH: Not able to assess due to his mental status EYE: Conjunctivae pale-pink ENT: Dry MM CV: Nontachycardic RESP: No audible wheezing GI: NABS, soft, protuberant abdomen, ventral diastases palpated, does not seem to be agitated by palpation of his abdomen, no rebound  MSK/EXT: Bilateral pedal edema present SKIN: No jaundice NEURO: Unable to assess due to mental status   Review of Data  I reviewed the following data at the time of this encounter:  Laboratory Studies   Recent Labs  Lab 08/02/22 0311  NA 145  K 4.4  CL 114*  CO2 20*  BUN 32*  CREATININE 1.55*  GLUCOSE 117*  CALCIUM 8.3*   Recent Labs  Lab 08/01/22 1228  AST 28  ALT 15  ALKPHOS 68    Recent Labs  Lab 08/01/22 1538 08/02/22 0311  WBC 5.6  --   HGB 5.0* 7.0*  HCT 16.4* 22.0*  PLT 137*  --    Recent Labs  Lab 08/01/22 1538 08/02/22 0311  APTT  --  25  INR 1.2  --    Imaging Studies  6/24 CTAbdomen IMPRESSION: 1. Hepatic cirrhosis. No focal lesions. The contrast bolus is somewhat limited but no obvious portal venous thrombosis is demonstrated in the central portal veins appear to have flow. 2. Gallbladder wall edema is likely due to liver disease. Bile duct stent with pneumobilia. 3. 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 4. Diffuse edema demonstrated in the subcutaneous fat, mesentery, and intra peritoneum. 5. Aortic atherosclerosis. 6. Small supraumbilical hernia with fat herniation and fat necrosis. 7. Large  bilateral pleural effusions with basilar atelectasis or consolidation.  7/24 CTHead IMPRESSION: No acute abnormality noted.   GI Procedures and Studies  6/24 EGD - Normal esophagus. - Z-line regular, 45 cm from the incisors. - A few gastric polyps. - Erythematous mucosa in the prepyloric region of the stomach. Endoscopically, this appeared suspicious for radiation gastritis. Biopsied. - Non-bleeding gastric ulcer with a visible vessel. Clip (MR conditional) was placed. - Erythematous duodenopathy. - Duodenal foreign body. - Normal second portion of the duodenum and third portion of the duodenum.   Assessment  [name] is a 89 y.o. 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  The GI service is consulted for evaluation and management of anemia with altered mental status.  The patient is hemodynamically stable this morning.  Clinically, he has a presentation that is most concerning for persisting GI bleeding in the setting of a higher risk lesion noted on endoscopy 3 weeks ago and his issues progressing over the course of the last few days, I am most concerned that he has a slow ooze or bleed from the area that was previously worked on.  He does have other indications and previous imaging that is suggested diverticulosis as well as some thickening within the colon.  Not sure we need to put him through a colonoscopy at this point and I would begin his evaluation with an upper endoscopy.  His hemoglobin is already up to  7 and we will try to add on for EGD today with anesthesia assistance.  This will be performed by Dr. Myrtie Neither or myself.  The risks and benefits of endoscopic evaluation were discussed with the patient; these include but are not limited to the risk of perforation, infection, bleeding, missed lesions, lack of diagnosis, severe illness requiring  hospitalization, as well as anesthesia and sedation related illnesses.  The patient and/or family is agreeable to proceed.  I will proceed later this month with biliary stent exchange to evaluate self-expanding metal stent so that we can no longer have to worry about his biliary tree if possible in the setting of his known ampullary cancer.  This will not be pursued this weekend.  All patient family questions were answered to the best of my ability, and the patient agrees to the aforementioned plan of action with follow-up as indicated.   Plan/Recommendations  Remain n.p.o. Hemoglobin goal greater than 7 - Agree with the extra unit of packed RBCs that is going in for the hemoglobin of 7 this morning Giving 10 mg IV Reglan this morning to ensure a clear stomach Proceed with endoscopy today, pending anesthesia availability Continue IV PPI If EGD is unremarkable, then we will need to consider colonoscopy   Thank you for this consult.  We will continue to follow.  Please page/call with questions or concerns.   Corliss Parish, MD [facility]oscopy Office # [phone]

## 2022-08-02 NOTE — Anesthesia Postprocedure Evaluation (Signed)
Anesthesia Post Note  Patient: Carl Woods  Procedure(s) Performed: ENTEROSCOPY HOT HEMOSTASIS (ARGON PLASMA COAGULATION/BICAP) BIOPSY     Patient location during evaluation: PACU Anesthesia Type: MAC Level of consciousness: awake and alert Pain management: pain level controlled Vital Signs Assessment: post-procedure vital signs reviewed and stable Respiratory status: spontaneous breathing, nonlabored ventilation, respiratory function stable and patient connected to nasal cannula oxygen Cardiovascular status: stable and blood pressure returned to baseline Postop Assessment: no apparent nausea or vomiting Anesthetic complications: no  No notable events documented.  Last Vitals:  Vitals:   08/02/22 1155 08/02/22 1230  BP: (!) 105/32 105/65  Pulse:  (!) 58  Resp: 19 (!) 23  Temp:    SpO2: 100% 100%    Last Pain:  Vitals:   08/02/22 1052  TempSrc: Temporal  PainSc:                  Kennieth Rad

## 2022-08-02 NOTE — Anesthesia Preprocedure Evaluation (Signed)
Anesthesia Evaluation  Patient identified by MRN, date of birth, ID band Patient awake    Reviewed: Allergy & Precautions, NPO status , Patient's Chart, lab work & pertinent test results, reviewed documented beta blocker date and time   Airway Mallampati: II  TM Distance: >3 FB Neck ROM: Limited    Dental no notable dental hx. (+) Caps, Dental Advisory Given   Pulmonary shortness of breath and with exertion, sleep apnea and Continuous Positive Airway Pressure Ventilation , Patient abstained from smoking., former smoker Pleural effusion   Pulmonary exam normal breath sounds clear to auscultation       Cardiovascular hypertension, Pt. on home beta blockers + Past MI  Normal cardiovascular exam+ dysrhythmias Atrial Fibrillation  Rhythm:Irregular Rate:Normal  NSTEMI on 06/2022 admission likely due to severe anemia  EKG 06/1022 Atrial fibrillation, low voltage, non specific ST-T wave changes  Echo 07/07/22 1. Left ventricular ejection fraction, by estimation, is 60 to 65%. The  left ventricle has normal function. The left ventricle has no regional  wall motion abnormalities. Left ventricular diastolic function could not  be evaluated. Elevated left atrial  pressure. The E/e' is 16.5.   2. Right ventricular systolic function is low normal. The right  ventricular size is mildly enlarged. There is moderately elevated  pulmonary artery systolic pressure. The estimated right ventricular  systolic pressure is 53.4 mmHg.   3. Right atrial size was dilated.   4. A small pericardial effusion is present. There is no evidence of  cardiac tamponade.   5. The mitral valve is degenerative. Trivial mitral valve regurgitation.  No evidence of mitral stenosis.   6. The aortic valve is grossly normal. Aortic valve regurgitation is not  visualized. Aortic valve sclerosis is present, with no evidence of aortic  valve stenosis.   7. The inferior vena  cava is dilated in size with <50% respiratory  variability, suggesting right atrial pressure of 15 mmHg.   8. Rhythm strip during this exam demonstrates atrial fibrillation.      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 Hx/o diverticulosis Hx/o anal fistula   Endo/Other  Hypothyroidism  Obesity  Renal/GU Renal InsufficiencyRenal diseaseRenal cyst   BPH    Musculoskeletal  (+) Arthritis , Osteoarthritis,    Abdominal  (+) + obese  Peds  Hematology  (+) Blood dyscrasia, anemia Chronic anticoagulation Thrombocytopenia   Anesthesia Other Findings   Reproductive/Obstetrics                             Anesthesia Physical Anesthesia Plan  ASA: 4  Anesthesia Plan: MAC   Post-op Pain Management:    Induction: Intravenous  PONV Risk Score and Plan: 1 and Propofol infusion and Treatment may vary due to age or medical condition  Airway Management Planned: Natural Airway and Nasal Cannula  Additional Equipment: None  Intra-op Plan:   Post-operative Plan:   Informed Consent: I have reviewed the patients History and Physical, chart, labs and discussed the procedure including the risks, benefits and alternatives for the proposed anesthesia with the patient or authorized representative who has indicated his/her understanding and acceptance.     Dental advisory given  Plan Discussed with: Anesthesiologist and CRNA  Anesthesia Plan Comments:         Anesthesia Quick Evaluation

## 2022-08-03 DIAGNOSIS — K5521 Angiodysplasia of colon with hemorrhage: Secondary | ICD-10-CM | POA: Diagnosis not present

## 2022-08-03 DIAGNOSIS — D62 Acute posthemorrhagic anemia: Secondary | ICD-10-CM | POA: Diagnosis not present

## 2022-08-03 DIAGNOSIS — D649 Anemia, unspecified: Secondary | ICD-10-CM | POA: Diagnosis not present

## 2022-08-03 DIAGNOSIS — K8309 Other cholangitis: Secondary | ICD-10-CM

## 2022-08-03 DIAGNOSIS — G9340 Encephalopathy, unspecified: Secondary | ICD-10-CM | POA: Diagnosis not present

## 2022-08-03 DIAGNOSIS — K31819 Angiodysplasia of stomach and duodenum without bleeding: Secondary | ICD-10-CM

## 2022-08-03 DIAGNOSIS — K921 Melena: Secondary | ICD-10-CM | POA: Diagnosis not present

## 2022-08-03 DIAGNOSIS — Z515 Encounter for palliative care: Secondary | ICD-10-CM | POA: Diagnosis not present

## 2022-08-03 DIAGNOSIS — R4589 Other symptoms and signs involving emotional state: Secondary | ICD-10-CM

## 2022-08-03 DIAGNOSIS — K922 Gastrointestinal hemorrhage, unspecified: Secondary | ICD-10-CM | POA: Diagnosis not present

## 2022-08-03 DIAGNOSIS — C241 Malignant neoplasm of ampulla of Vater: Secondary | ICD-10-CM

## 2022-08-03 LAB — CBC
HCT: 30.2 % — ABNORMAL LOW (ref 39.0–52.0)
Hemoglobin: 9.6 g/dL — ABNORMAL LOW (ref 13.0–17.0)
MCH: 30.6 pg (ref 26.0–34.0)
MCHC: 31.8 g/dL (ref 30.0–36.0)
MCV: 96.2 fL (ref 80.0–100.0)
Platelets: 137 10*3/uL — ABNORMAL LOW (ref 150–400)
RBC: 3.14 MIL/uL — ABNORMAL LOW (ref 4.22–5.81)
RDW: 27.4 % — ABNORMAL HIGH (ref 11.5–15.5)
WBC: 6.6 10*3/uL (ref 4.0–10.5)
nRBC: 1.8 % — ABNORMAL HIGH (ref 0.0–0.2)

## 2022-08-03 LAB — COMPREHENSIVE METABOLIC PANEL
ALT: 17 U/L (ref 0–44)
AST: 28 U/L (ref 15–41)
Albumin: 2.5 g/dL — ABNORMAL LOW (ref 3.5–5.0)
Alkaline Phosphatase: 60 U/L (ref 38–126)
Anion gap: 11 (ref 5–15)
BUN: 32 mg/dL — ABNORMAL HIGH (ref 8–23)
CO2: 19 mmol/L — ABNORMAL LOW (ref 22–32)
Calcium: 8.2 mg/dL — ABNORMAL LOW (ref 8.9–10.3)
Chloride: 114 mmol/L — ABNORMAL HIGH (ref 98–111)
Creatinine, Ser: 1.58 mg/dL — ABNORMAL HIGH (ref 0.61–1.24)
GFR, Estimated: 42 mL/min — ABNORMAL LOW (ref >60)
Glucose, Bld: 124 mg/dL — ABNORMAL HIGH (ref 70–99)
Potassium: 3.4 mmol/L — ABNORMAL LOW (ref 3.5–5.1)
Sodium: 144 mmol/L (ref 135–145)
Total Bilirubin: 1 mg/dL (ref 0.3–1.2)
Total Protein: 5.3 g/dL — ABNORMAL LOW (ref 6.5–8.1)

## 2022-08-03 LAB — GLUCOSE, CAPILLARY
Glucose-Capillary: 90 mg/dL (ref 70–99)
Glucose-Capillary: 126 mg/dL — ABNORMAL HIGH (ref 70–99)
Glucose-Capillary: 107 mg/dL — ABNORMAL HIGH (ref 70–99)
Glucose-Capillary: 121 mg/dL — ABNORMAL HIGH (ref 70–99)

## 2022-08-03 LAB — HEMOGLOBIN AND HEMATOCRIT, BLOOD
HCT: 29.1 % — ABNORMAL LOW (ref 39.0–52.0)
Hemoglobin: 9.4 g/dL — ABNORMAL LOW (ref 13.0–17.0)

## 2022-08-03 LAB — MAGNESIUM: Magnesium: 1.4 mg/dL — ABNORMAL LOW (ref 1.7–2.4)

## 2022-08-03 LAB — PHOSPHORUS: Phosphorus: 3.2 mg/dL (ref 2.5–4.6)

## 2022-08-03 MED ORDER — POTASSIUM CHLORIDE CRYS ER 20 MEQ PO TBCR
30.0000 meq | EXTENDED_RELEASE_TABLET | ORAL | Status: AC
  Administered 2022-08-03 (×2): 30 meq via ORAL
  Filled 2022-08-03 (×2): qty 1

## 2022-08-03 MED ORDER — MAGNESIUM SULFATE 4 GM/100ML IV SOLN
4.0000 g | Freq: Once | INTRAVENOUS | Status: AC
  Administered 2022-08-03: 4 g via INTRAVENOUS
  Filled 2022-08-03: qty 100

## 2022-08-03 NOTE — Evaluation (Signed)
Physical Therapy Evaluation Patient Details Name: Carl Woods MRN: [id number] DOB: 1931-09-13 Today's Date: 08/03/2022  History of Present Illness  Pt is a 89 year old male admitted 08/01/22 for acute metabolic encephalopathy, acute blood loss anemia, melena. Past medical history significant for paroxysmal atrial fibrillation, ampullary carcinoma, history of AVMs, sacral angiodysplasia, BPH, CKD stage IIIb, history of diverticulosis, hypothyroidism, OSA, obesity, BPH and recent hospitalization for GI bleed secondary to prepyloric ulcer s/p clipping. Patient recently discharged from SNF rehab Monday in which he is currently living at home alone.  Clinical Impression  Pt admitted with above diagnosis.  Pt currently with functional limitations due to the deficits listed below (see PT Problem List). Pt will benefit from acute skilled PT to increase their independence and safety with mobility to allow discharge.  Pt with increased work of breathing and with talking during session however SPO2 98-100% on room air.  Pt reports generalized weakness and fatigue.  Pt unable to perform full AROM of UE and LEs and too weak to grip RW or hold cup of water.  Pt also too weak to attempt standing.  Pt was able to sit EOB with min/guard but required total assist for bed mobility to/from sitting position.  Pt with recent admission and rehab stay however had been home a few days prior to this admission and was home alone performing activities modified independently.  Pt with significant decline in function and may require post acute rehab upon d/c.       Assistance Recommended at Discharge Frequent or constant Supervision/Assistance  If plan is discharge home, recommend the following:  Can travel by private vehicle  Assistance with cooking/housework;Direct supervision/assist for medications management;Assist for transportation;Help with stairs or ramp for entrance;A lot of help with walking and/or transfers;A lot of help  with bathing/dressing/bathroom   No    Equipment Recommendations None recommended by PT  Recommendations for Other Services       Functional Status Assessment Patient has had a recent decline in their functional status and demonstrates the ability to make significant improvements in function in a reasonable and predictable amount of time.     Precautions / Restrictions Precautions Precautions: Fall      Mobility  Bed Mobility Overal bed mobility: Needs Assistance Bed Mobility: Supine to Sit, Sit to Supine     Supine to sit: Total assist, +2 for physical assistance Sit to supine: Total assist, +2 for physical assistance   General bed mobility comments: provided multimodal cues and time to perform however pt requiring total assist    Transfers                   General transfer comment: provided RW however pt unable to grip RW or initiate standing    Ambulation/Gait                  Stairs            Wheelchair Mobility     Tilt Bed    Modified Rankin (Stroke Patients Only)       Balance Overall balance assessment: Needs assistance, History of Falls Sitting-balance support: Feet supported, No upper extremity supported Sitting balance-Leahy Scale: Fair                                       Pertinent Vitals/Pain Pain Assessment Pain Assessment: No/denies pain    Home Living  Family/patient expects to be discharged to:: Private residence Living Arrangements: Spouse/significant other Available Help at Discharge: Family Type of Home: House Home Access: Stairs to enter   [employer] of Steps: 1+1   Home Layout: One level;Able to live on main level with bedroom/bathroom;Full bath on main level Home Equipment: Shower seat;Grab bars - tub/shower Additional Comments: pt's wife Okey Regal has dementia, pt's son lives in [place] part time and pt has a daughter in [place]. States his kids are all helping with pt's wife and  staying overnight.    Prior Function Prior Level of Function : Independent/Modified Independent             Mobility Comments: ambulatory with RW in home, only home from rehab a few days prior to this current admission ADLs Comments: independent with basic ADLs, not driving (since recent SNF rehab stay)     Hand Dominance   Dominant Hand: Left    Extremity/Trunk Assessment   Upper Extremity Assessment Upper Extremity Assessment: Generalized weakness (unable to hold a cup of water)    Lower Extremity Assessment Lower Extremity Assessment: Generalized weakness    Cervical / Trunk Assessment Cervical / Trunk Assessment: Kyphotic  Communication   Communication: No difficulties  Cognition Arousal/Alertness: Awake/alert Behavior During Therapy: WFL for tasks assessed/performed Overall Cognitive Status: Within Functional Limits for tasks assessed                                 General Comments: family reports pt's cognition improved since admission        General Comments      Exercises     Assessment/Plan    PT Assessment Patient needs continued PT services  PT Problem List Decreased strength;Decreased activity tolerance;Decreased balance;Decreased mobility;Decreased knowledge of use of DME;Decreased safety awareness;Decreased knowledge of precautions;Cardiopulmonary status limiting activity       PT Treatment Interventions DME instruction;Gait training;Functional mobility training;Therapeutic activities;Therapeutic exercise;Balance training;Neuromuscular re-education;Cognitive remediation;Patient/family education;Wheelchair mobility training    PT Goals (Current goals can be found in the Care Plan section)  Acute Rehab PT Goals PT Goal Formulation: With patient/family Time For Goal Achievement: 08/17/22 Potential to Achieve Goals: Fair    Frequency Min 1X/week     Co-evaluation               AM-PAC PT "6 Clicks" Mobility  Outcome  Measure Help needed turning from your back to your side while in a flat bed without using bedrails?: Total Help needed moving from lying on your back to sitting on the side of a flat bed without using bedrails?: Total Help needed moving to and from a bed to a chair (including a wheelchair)?: Total Help needed standing up from a chair using your arms (e.g., wheelchair or bedside chair)?: Total Help needed to walk in hospital room?: Total Help needed climbing 3-5 steps with a railing? : Total 6 Click Score: 6    End of Session   Activity Tolerance: Patient limited by fatigue Patient left: in bed;with call bell/phone within reach;with bed alarm set;with family/visitor present   PT Visit Diagnosis: Muscle weakness (generalized) (M62.81);Difficulty in walking, not elsewhere classified (R26.2)    Time: 1610-9604 PT Time Calculation (min) (ACUTE ONLY): 24 min   Charges:   PT Evaluation $PT Eval Moderate Complexity: 1 Mod   PT General Charges $$ ACUTE PT VISIT: 1 Visit        Thomasene Mohair PT, DPT Physical Therapist Acute Rehabilitation  Services Office: [phone]   Carlyon Prows 08/03/2022, 3:15 PM

## 2022-08-03 NOTE — Progress Notes (Addendum)
PROGRESS NOTE    Carl Woods  HYQ:[id number] DOB: 1931-11-11 DOA: 08/01/2022 PCP: [name], MD    Brief Narrative:   Carl Woods is a 89 y.o. male with past medical history significant for paroxysmal atrial fibrillation, ampullary carcinoma, history of AVMs, sacral angiodysplasia, BPH, CKD stage IIIb, history of diverticulosis, hypothyroidism, OSA, obesity, BPH and recent hospitalization for GI bleed secondary to prepyloric ulcer s/p clipping who presented to [facility] ED on 7/5 from home for altered mental status.  Patient recently discharged from SNF rehab Monday in which she is currently living at home alone.  Family noted that he was becoming more confused, weak and drowsy, fatigue on Thursday.  Daughter also noted patient with black stools.  Patient apparently has been off of his Eliquis since previous hospitalization.  In the ED, patient was lethargic, confused; slightly arousable but falls back asleep.  Temperature 98.0 F, HR 93, RR 15, BP 130/49, SpO2 100% on room air.  WBC 5.6, hemoglobin 5.0, MCV 117.1, platelet count 137.  Sodium 143, potassium 4.1, chloride 114, CO2 21, glucose 135, BUN 32, creatinine 1.53.  High sensitive troponin 51 followed by 43.  Urinalysis unrevealing.  FOBT positive.  CT head without contrast with no acute intracranial abnormality.  GI was consulted.  2 unit PRBCs ordered.  TRH consulted for admission for further evaluation and management of recurrent GI bleed.  Assessment & Plan:   Acute metabolic encephalopathy Etiology likely secondary to significant anemia secondary to GI bleed versus infectious with cholangitis noted with purulent bile on EGD.  CT head without contrast with no acute findings. 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  Hemoglobin noted to be 5.0 with MCV of 117.1.  FOBT positive.  Anemia panel with iron 139, TIBC 228, ferritin 103, folate 10.9, vitamin B12 451.  GI was consulted and patient underwent EGD on 08/02/2022 with findings of gastric antral vascular ectasia with bleeding s/p APC, single nonbleeding angiectasia jejunum treated with APC. -- Hgb 5.0>7.0>9.3>10.2>9.6 -- s/p 2 u pRBC 7/5, 2 u pRBC 7/6 -- Continue Protonix drip -- H&H every 12 hours -- CBC daily  Hx ampullary cancer s/p ERCP with plastic stent placement Cholangitis Patient with previous ERCP with plastic stent placement was planned to have repeat ERCP soon for metal stent placement.  On EGD 7/6; plastic stent no longer visualized and prolonged observation of the ampullary area revealed passage of purulent bile.  May need stent replacement with metal stent, timing per GI. -- Zosyn -- CBC daily  Hypokalemia Hypomagnesemia Potassium 3.4, magnesium 1.4, will replete. -- Repeat electrolytes in a.m.  Hypothyroidism -- Levothyroxine 112 mcg p.o. daily  Essential hypertension At baseline on metoprolol succinate 12.5 mg p.o. daily, Bumex 1 mg p.o. daily. -- Borderline hypotensive, hold antihypertensives for now -- Continue monitor BP closely  CKD stage IIIb Creatinine 1.55>1.58, stable. --BMP daily  BPH Not on medication outpatient. --Continue monitor urine output  Obesity Body mass index is 32.85 kg/m.  Discussed with patient needs for aggressive lifestyle changes/weight loss as this complicates all facets of care.  Outpatient follow-up with PCP.    Goals of care: Patient now with recurrent hospitalization, per GI; Dr. Myrtie Neither may not have more to offer this patient unfortunately.  Overall prognosis poor.   -- Palliative care following,  appreciate assistance   DVT prophylaxis: SCDs Start: 08/01/22 2125    Code Status: DNR Family Communication: No family present at bedside this  morning  Disposition Plan:  Level of care: Stepdown Status is: Inpatient Remains inpatient appropriate because: IV antibiotics, need to ensure stability of hemoglobin    Consultants:  [facility]  Procedures:  EGD  Antimicrobials:  Zosyn 7/6>>   Subjective: Patient seen examined at bedside, resting calmly.  Lying in bed.  Much more alert and oriented this morning.  Hemoglobin 9.6, appears to have stabilized.  Patient denies headache, no dizziness, no chest pain, no shortness of breath, no abdominal pain.  No acute concerns this morning per nursing staff.  Received message from family that patient has a "living will" and they are reversing the DNR and requesting return to full CODE STATUS.  Objective: Vitals:   08/03/22 0600 08/03/22 0700 08/03/22 0800 08/03/22 0900  BP: (!) 126/54 119/61 102/71 (!) 117/45  Pulse: 89 76 (!) 108 89  Resp: 19 (!) 24 (!) 22 (!) 25  Temp:      TempSrc:      SpO2: 98% 99% 99% 100%  Weight:      Height:        Intake/Output Summary (Last 24 hours) at 08/03/2022 1048 Last data filed at 08/03/2022 0800 Gross per 24 hour  Intake 995.93 ml  Output 1300 ml  Net -304.07 ml   Filed Weights   08/01/22 1402 08/02/22 1052  Weight: 92 kg 98 kg    Examination:  Physical Exam: GEN: NAD, alert and oriented x 3, confused, chronically ill in appearance HEENT: NCAT, PERRL, EOMI, sclera clear, dry mucous membranes PULM: CTAB w/o wheezes/crackles, normal respiratory effort, on room air CV: RRR w/o M/G/R GI: abd soft, NTND, NABS, no R/G/M MSK: 2+ pitting edema bilateral lower extremities up to mid shin, chronic venous changes/skin changes noted to bilateral lower extremities, moves all extremities independently NEURO: No focal neurological deficits appreciated Integumentary: No concerning rashes/lesions/wounds noted on exposed skin surfaces.    Data Reviewed: I have personally reviewed following labs and imaging studies  CBC: Recent  Labs  Lab 08/01/22 1538 08/02/22 0311 08/02/22 1512 08/02/22 2009 08/03/22 0252  WBC 5.6  --   --   --  6.6  NEUTROABS 3.8  --   --   --   --   HGB 5.0* 7.0* 9.3* 10.2* 9.6*  HCT 16.4* 22.0* 28.8* 32.0* 30.2*  MCV 117.1*  --   --   --  96.2  PLT 137*  --   --   --  137*   Basic Metabolic Panel: Recent Labs  Lab 08/01/22 1228 08/01/22 1538 08/02/22 0311 08/03/22 0252  NA 145 143 145 144  K 4.2 4.1 4.4 3.4*  CL 113* 114* 114* 114*  CO2 21* 21* 20* 19*  GLUCOSE 128* 135* 117* 124*  BUN 30* 32* 32* 32*  CREATININE 1.57* 1.53* 1.55* 1.58*  CALCIUM 8.7* 8.3* 8.3* 8.2*  MG  --   --   --  1.4*  PHOS  --   --   --  3.2   GFR: Estimated Creatinine Clearance: 36 mL/min (A) (by C-G formula based on SCr of 1.58 mg/dL (H)). Liver Function Tests: Recent Labs  Lab 08/01/22 1228 08/03/22 0252  AST 28 28  ALT 15 17  ALKPHOS 68 60  BILITOT 0.7 1.0  PROT 5.4* 5.3*  ALBUMIN 3.0* 2.5*   No results for input(s): "LIPASE", "AMYLASE" in the last  168 hours. No results for input(s): "AMMONIA" in the last 168 hours. Coagulation Profile: Recent Labs  Lab 08/01/22 1538  INR 1.2   Cardiac Enzymes: No results for input(s): "CKTOTAL", "CKMB", "CKMBINDEX", "TROPONINI" in the last 168 hours. BNP (last 3 results) No results for input(s): "PROBNP" in the last 8760 hours. HbA1C: Recent Labs    08/02/22 0311  HGBA1C 4.4*   CBG: Recent Labs  Lab 08/02/22 0751 08/02/22 1444 08/02/22 2117  GLUCAP 106* 95 105*   Lipid Profile: No results for input(s): "CHOL", "HDL", "LDLCALC", "TRIG", "CHOLHDL", "LDLDIRECT" in the last 72 hours. Thyroid Function Tests: No results for input(s): "TSH", "T4TOTAL", "FREET4", "T3FREE", "THYROIDAB" in the last 72 hours. Anemia Panel: Recent Labs    08/01/22 1229 08/02/22 0311 08/02/22 1416  VITAMINB12  --   --  451  FOLATE  --   --  10.9  FERRITIN 105  --  103  TIBC  --   --  288  IRON  --   --  139  RETICCTPCT  --  4.8*  --    Sepsis  Labs: No results for input(s): "PROCALCITON", "LATICACIDVEN" in the last 168 hours.  Recent Results (from the past 240 hour(s))  MRSA Next Gen by PCR, Nasal     Status: None   Collection Time: 08/01/22 11:17 PM   Specimen: Nasal Mucosa; Nasal Swab  Result Value Ref Range Status   MRSA by PCR Next Gen NOT DETECTED NOT DETECTED Final    Comment: (NOTE) The GeneXpert MRSA Assay (FDA approved for NASAL specimens only), is one component of a comprehensive MRSA colonization surveillance program. It is not intended to diagnose MRSA infection nor to guide or monitor treatment for MRSA infections. Test performance is not FDA approved in patients less than 45 years old. Performed at [facility], [address] [place], [place] [address]          Radiology Studies: DG Abd Portable 2V  Result Date: 08/02/2022 CLINICAL DATA:  Biliary stent migration. EGD today. Previously placed biliary stent was not visible via scope. EXAM: PORTABLE ABDOMEN - 2 VIEW COMPARISON:  CT 07/07/2022 FINDINGS: The previously seen biliary stent has migrated and is now located in the right lower quadrant. This could be within the distal small bowel or proximal colon. No free air, organomegaly, or bowel obstruction. No confluent opacity in the lungs. IMPRESSION: Biliary stent has migrated into the right lower quadrant, possibly within the distal small bowel or proximal colon. Electronically Signed   By: Charlett Nose M.D.   On: 08/02/2022 17:22   CT Head Wo Contrast  Result Date: 08/01/2022 CLINICAL DATA:  Altered mental status EXAM: CT HEAD WITHOUT CONTRAST TECHNIQUE: Contiguous axial images were obtained from the base of the skull through the vertex without intravenous contrast. RADIATION DOSE REDUCTION: This exam was performed according to the departmental dose-optimization program which includes automated exposure control, adjustment of the mA and/or kV according to patient size and/or use of iterative  reconstruction technique. COMPARISON:  None Available. FINDINGS: Brain: No evidence of acute infarction, hemorrhage, hydrocephalus, extra-axial collection or mass lesion/mass effect. Vascular: No hyperdense vessel or unexpected calcification. Skull: Normal. Negative for fracture or focal lesion. Sinuses/Orbits: No acute finding. Other: None. IMPRESSION: No acute abnormality noted. Electronically Signed   By: Alcide Clever M.D.   On: 08/01/2022 16:15        Scheduled Meds:  Chlorhexidine Gluconate Cloth  6 each Topical Daily   cholecalciferol  5,000 Units Oral QPM  insulin aspart  0-5 Units Subcutaneous QHS   insulin aspart  0-9 Units Subcutaneous TID WC   levothyroxine  112 mcg Oral Q0600   [START ON 08/05/2022] pantoprazole  40 mg Intravenous Q12H   potassium chloride  30 mEq Oral Q3H   sodium chloride flush  3 mL Intravenous Q12H   Continuous Infusions:  pantoprazole 8 mg/hr (08/03/22 0800)   piperacillin-tazobactam (ZOSYN)  IV Stopped (08/03/22 0915)     LOS: 2 days    Time spent: 52 minutes spent on chart review, discussion with nursing staff, consultants, updating family and interview/physical exam; more than 50% of that time was spent in counseling and/or coordination of care.    [name] [name], DO [facility] Available via Epic secure chat 7am-7pm After these hours, please refer to coverage provider listed on [email or web address] 08/03/2022, 10:48 AM

## 2022-08-03 NOTE — Progress Notes (Signed)
Daily Progress Note   Patient Name: Carl Woods       Date: 08/03/2022 DOB: [birth year]  Age: 89 y.o. MRN#: [id number] Attending Physician: [name], Eric J, DO Primary Care Physician: [name], MD Admit Date: 08/01/2022 Length of Stay: 2 days  Reason for Consultation/Follow-up: Establishing goals of care  Subjective:   CC: Patient more interactive this morning.  Following up regarding complex medical decision making.  Subjective:  Reviewed EMR prior to presenting to bedside at 10 AM for scheduled family meeting.  Seen by GI physician this morning and there is consideration of further GI interventions including ERCP to pace another biliary stent.  Presented to bedside at time of family meeting.  Patient laying comfortably in bed.  Patient 3 children present.  Introduced myself as a member of the palliative medicine team.  Able to review patient's medical course and updates today.  Did discuss that patient appears more awake and interactive today while blood pressure is also improved.  Patient also noted to not have further bleeding at this time.  Family able to update this provider about positive conversation had with GI this morning about possible further interventions for patient's medical conditions.  At this time continuing with current pathway of appropriate medical care which is very much supported.  Patient did not want to engage in discussions about what would happen if he had recurrent bleeding.  Patient just noted the doctor would tell him what to do.  Encouraged continued conversations between family and patient about his wishes regarding medical care moving forward.  Spent time providing emotional support via active listening.  All questions answered at that time.  Thank patient and family for allowing me to visit with him today.  Cussed care with bedside RN after visit.  Objective:   Vital Signs:  BP (!) 125/56   Pulse 85   Temp 97.6 F (36.4 C) (Oral)   Resp (!) 22    Ht 5\' 8"  (1.727 m)   Wt 98 kg   SpO2 99%   BMI 32.85 kg/m   Physical Exam: General: NAD, awake, interactive, laying comfortably in bed, chronically ill-appearing Eyes: No drainage noted HENT: dry mucous membranes Cardiovascular: RRR,  Respiratory: no increased work of breathing noted, not in respiratory distress Neuro: Awake, pleasant, interactive    Imaging:  I personally reviewed recent imaging.   Assessment & Plan:   Assessment: Patient is an 89 year old male with a past medical history of paroxysmal A-fib, ampullary carcinoma, history of AVMs, sacral angiodysplasia, BPH, CKD stage III, history of diverticulosis, hypothyroidism, OSA, obesity, and BPH who was admitted on 08/01/2022 for management of confusion, weakness, and fatigue.  Patient has had recent hospitalization for management of GI bleeding secondary to prepyloric ulcer status post clipping.  During hospitalization, GI consulted for upper GI evaluation.  Patient underwent EGD on 08/02/2022 with findings of gastric antral vascular ectasia with bleeding status post APC, single nonbleeding angioectasia jejunum treated with APC.  On EGD on 7/6 it was noted that patient no longer has stent in place at ampullary opening for management of ampullary cancer and there was purulent drainage noted at site on EGD.  Palliative medicine team consulted to assist with complex medical decision making.    Recommendations/Plan: # Complex medical decision making/goals of care:  -Discussed care with patient and 3 children today regarding pathways for medical care moving forward.  Patient and family heard from GI regarding further interventions that can be done to assist with patient's chronic medical  illness.  Discussed continuing with appropriate medical care at this time.  Encouraged further conversations regarding should patient's medical status further deteriorate.                -  Code Status: DNR    # Symptom management                -As  per primary hospitalist    # Psycho-social/Spiritual Support:  - Support System: 2 sons, 2 daughter, wife- dementia in SNF  # Discharge Planning: To Be Determined  Discussed with: Patient, Patient's 3 children at bedside, RN  Thank you for allowing the palliative care team to participate in the care Carl Woods.  Carl Morin, DO Palliative Care Provider PMT # [phone]  If patient remains symptomatic despite maximum doses, please call PMT at [phone] between 0700 and 1900. Outside of these hours, please call attending, as PMT does not have night coverage.  This provider spent a total of 50 minutes providing patient's care.  Includes review of EMR, discussing care with other staff members involved in patient's medical care, obtaining relevant history and information from patient and/or patient's family, and personal review of imaging and lab work. Greater than 50% of the time was spent counseling and coordinating care related to the above assessment and plan.    *Please note that this is a verbal dictation therefore any spelling or grammatical errors are due to the "[employer] One" system interpretation.

## 2022-08-03 NOTE — Progress Notes (Signed)
OT Cancellation Note  Patient Details Name: ABE REINE MRN: [id number] DOB: 1932-01-17   Cancelled Treatment:    Reason Eval/Treat Not Completed: Fatigue/lethargy limiting ability to participate Patient with various visitors all day and just worked with PT. Patient very fatigued. OT to continue to follow and check back on 7/8.  Rosalio Loud, MS Acute Rehabilitation Department Office# [phone]  08/03/2022, 3:25 PM

## 2022-08-03 NOTE — Progress Notes (Addendum)
[facility] Progress Note  Chief Complaint: GI bleeding with anemia  History:  Nashaun remains in the ICU, though his condition has stabilized.  His mental status is much improved from when I saw him yesterday, and he is currently sitting up in bed being fed breakfast with assistance from staff.  His mentation is somewhat sluggish, though he is conversational and denies abdominal pain chest pain or dyspnea.  His son Aquino is also at the bedside, and he had a discussion about his father's case and care.  Palliative care evaluated him yesterday and that note was reviewed. Antibiotics were started after yesterday's EGD and communication with hospitalist service, there is been no fever documented.  In addition, nursing and tech staff say there have been no reports of melena or bright red blood per rectum overnight.  No further transfusion since yesterday.   Objective:   Current Facility-Administered Medications:    acetaminophen (TYLENOL) tablet 650 mg, 650 mg, Oral, Q6H PRN **OR** acetaminophen (TYLENOL) suppository 650 mg, 650 mg, Rectal, Q6H PRN, Danis, Starr Lake III, MD   Chlorhexidine Gluconate Cloth 2 % PADS 6 each, 6 each, Topical, Daily, Danis, Starr Lake III, MD, 6 each at 08/02/22 1046   cholecalciferol (VITAMIN D3) 25 MCG (1000 UNIT) tablet 5,000 Units, 5,000 Units, Oral, QPM, Danis, Starr Lake III, MD, 5,000 Units at 08/02/22 1733   insulin aspart (novoLOG) injection 0-5 Units, 0-5 Units, Subcutaneous, QHS, Danis, Sheridan Hew L III, MD   insulin aspart (novoLOG) injection 0-9 Units, 0-9 Units, Subcutaneous, TID WC, Danis, Starr Lake III, MD   levothyroxine (SYNTHROID) tablet 112 mcg, 112 mcg, Oral, Q0600, [name], MD, 112 mcg at 08/03/22 0515   magnesium sulfate IVPB 4 g 100 mL, 4 g, Intravenous, Once, [name], [name], DO, Last Rate: 50 mL/hr at 08/03/22 0833, 4 g at 08/03/22 0833   [START ON 08/05/2022] pantoprazole (PROTONIX) injection 40 mg, 40 mg, Intravenous, Q12H, Danis, Starr Lake III, MD    pantoprozole (PROTONIX) 80 mg /NS 100 mL infusion, 8 mg/hr, Intravenous, Continuous, Danis, Starr Lake III, MD, Last Rate: 10 mL/hr at 08/03/22 0220, 8 mg/hr at 08/03/22 0220   piperacillin-tazobactam (ZOSYN) IVPB 3.375 g, 3.375 g, Intravenous, Q8H, Cindi Carbon, RPH, Last Rate: 12.5 mL/hr at 08/03/22 0515, 3.375 g at 08/03/22 0515   polyethylene glycol (MIRALAX / GLYCOLAX) packet 17 g, 17 g, Oral, Daily PRN, Danis, Starr Lake III, MD   potassium chloride (KLOR-CON M) CR tablet 30 mEq, 30 mEq, Oral, Q3H, [name], Eric J, DO, 30 mEq at 08/03/22 1610   sodium chloride flush (NS) 0.9 % injection 3 mL, 3 mL, Intravenous, Q12H, Danis, Xoe Hoe L III, MD, 3 mL at 08/03/22 0835   magnesium sulfate bolus IVPB 4 g (08/03/22 0833)   pantoprazole 8 mg/hr (08/03/22 0220)   piperacillin-tazobactam (ZOSYN)  IV 3.375 g (08/03/22 0515)     Vital signs in last 24 hrs: Vitals:   08/03/22 0300 08/03/22 0400  BP: (!) 126/56 (!) 125/56  Pulse: 87 85  Resp: (!) 23 (!) 22  Temp:  97.6 F (36.4 C)  SpO2: 100% 99%    Intake/Output Summary (Last 24 hours) at 08/03/2022 0913 Last data filed at 08/02/2022 2000 Gross per 24 hour  Intake 1127.84 ml  Output 1300 ml  Net -172.16 ml     Physical Exam Frail elderly man, sitting up in bed eating breakfast with assistance. HEENT: sclera anicteric, Cardiac: RRR without murmurs, S1S2 heard, no peripheral edema Pulm: clear to  auscultation bilaterally, normal RR and effort noted Abdomen: soft, no tenderness, with active bowel sounds.  No distention Skin; warm and dry, pale, no jaundice  Recent Labs:     Latest Ref Rng & Units 08/03/2022    2:52 AM 08/02/2022    8:09 PM 08/02/2022    3:12 PM  CBC  WBC 4.0 - 10.5 K/uL 6.6     Hemoglobin 13.0 - 17.0 g/dL 9.6  19.1  9.3   Hematocrit 39.0 - 52.0 % 30.2  32.0  28.8   Platelets 150 - 400 K/uL 137       Recent Labs  Lab 08/01/22 1538  INR 1.2      Latest Ref Rng & Units 08/03/2022    2:52 AM 08/02/2022    3:11 AM 08/01/2022     3:38 PM  CMP  Glucose 70 - 99 mg/dL 478  295  621   BUN 8 - 23 mg/dL 32  32  32   Creatinine 0.61 - 1.24 mg/dL 3.08  6.57  8.46   Sodium 135 - 145 mmol/L 144  145  143   Potassium 3.5 - 5.1 mmol/L 3.4  4.4  4.1   Chloride 98 - 111 mmol/L 114  114  114   CO2 22 - 32 mmol/L 19  20  21    Calcium 8.9 - 10.3 mg/dL 8.2  8.3  8.3   Total Protein 6.5 - 8.1 g/dL 5.3     Total Bilirubin 0.3 - 1.2 mg/dL 1.0     Alkaline Phos 38 - 126 U/L 60     AST 15 - 41 U/L 28     ALT 0 - 44 U/L 17        Radiologic studies: KUB yesterday shows that the plastic biliary stent has migrated into the distal GI tract  Assessment & Plan  Assessment: Melena  Acute blood loss anemia  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  Altered mental status is significantly improved today after further transfusion and antibiotic treatment for suspected low-grade cholangitis with purulent bile seen flowing from the ampullary orifice during yesterday's endoscopy.  Small bowel AVM, no active bleeding, treated with APC yesterday.   I had a long discussion with his son, and they are comfortable with the status of his condition and have reasonable expectations about his care and prognosis.  Patient is currently DNR which I feel is appropriate for him. His gastrointestinal bleeding appears to have significantly slowed down or perhaps stopped at this point.  There still appears to be some equilibration of his hemoglobin after transfusion and the initial bleeding. It is not clear if the endoscopic findings yesterday were the entire source of bleeding, but I feel that his condition precludes preparation for an performance of a colonoscopy due to high risk of complications.  I also do not know that we would likely find a source upon which endoscopic intervention could be taken or if the source of bleeding is really in that area.   He is not bleeding briskly enough at this point for CT angiogram to be helpful, and he has an elevated creatinine that most likely precludes use of IV contrast.  In all, expectant management is warranted for his GI bleeding at this point.  He is still on a Protonix drip and that will be converted to split dose IV and then oral dosing per protocol.  Q 12-hour hemoglobin and hematocrit  for the next couple of days seems appropriate.  Lastly, we will continue to follow him and our advanced biliary endoscopist's will make a decision on the need for and timing of ERCP to place a metal biliary stent.  Fortunately, his LFTs are normal at this point, but he does seem likely to develop recurrence of malignancy and biliary obstruction at some point.  For now, the bile duct is clearly draining well with the plastic biliary stent having migrated out of the duct, which is particularly important since there is apparent cholangitis.  I suspect the stent was most likely at least partially occluded for as long as it has been in, and that may actually have been contributing to the infection.  Dr. Chales Abrahams will be managing our [facility] long consult service starting tomorrow.   40 minutes were spent on this encounter (including chart review, history/exam, counseling/coordination of care, and documentation) > 50% of that time was spent on counseling and coordination of care.   [name] Office: [phone]

## 2022-08-04 ENCOUNTER — Encounter (HOSPITAL_COMMUNITY): Payer: Self-pay | Admitting: Gastroenterology

## 2022-08-04 ENCOUNTER — Other Ambulatory Visit: Payer: PPO

## 2022-08-04 DIAGNOSIS — G9340 Encephalopathy, unspecified: Secondary | ICD-10-CM | POA: Diagnosis not present

## 2022-08-04 DIAGNOSIS — R600 Localized edema: Secondary | ICD-10-CM

## 2022-08-04 DIAGNOSIS — K31811 Angiodysplasia of stomach and duodenum with bleeding: Secondary | ICD-10-CM

## 2022-08-04 DIAGNOSIS — E860 Dehydration: Secondary | ICD-10-CM

## 2022-08-04 DIAGNOSIS — K5520 Angiodysplasia of colon without hemorrhage: Secondary | ICD-10-CM | POA: Diagnosis not present

## 2022-08-04 DIAGNOSIS — R4182 Altered mental status, unspecified: Secondary | ICD-10-CM | POA: Diagnosis not present

## 2022-08-04 DIAGNOSIS — D500 Iron deficiency anemia secondary to blood loss (chronic): Secondary | ICD-10-CM | POA: Diagnosis not present

## 2022-08-04 DIAGNOSIS — D62 Acute posthemorrhagic anemia: Secondary | ICD-10-CM | POA: Diagnosis not present

## 2022-08-04 DIAGNOSIS — K922 Gastrointestinal hemorrhage, unspecified: Secondary | ICD-10-CM | POA: Diagnosis not present

## 2022-08-04 DIAGNOSIS — I129 Hypertensive chronic kidney disease with stage 1 through stage 4 chronic kidney disease, or unspecified chronic kidney disease: Secondary | ICD-10-CM

## 2022-08-04 DIAGNOSIS — N183 Chronic kidney disease, stage 3 unspecified: Secondary | ICD-10-CM

## 2022-08-04 LAB — COMPREHENSIVE METABOLIC PANEL
ALT: 18 U/L (ref 0–44)
AST: 23 U/L (ref 15–41)
Albumin: 2.3 g/dL — ABNORMAL LOW (ref 3.5–5.0)
Alkaline Phosphatase: 56 U/L (ref 38–126)
Anion gap: 5 (ref 5–15)
BUN: 31 mg/dL — ABNORMAL HIGH (ref 8–23)
CO2: 20 mmol/L — ABNORMAL LOW (ref 22–32)
Calcium: 7.9 mg/dL — ABNORMAL LOW (ref 8.9–10.3)
Chloride: 117 mmol/L — ABNORMAL HIGH (ref 98–111)
Creatinine, Ser: 1.69 mg/dL — ABNORMAL HIGH (ref 0.61–1.24)
GFR, Estimated: 38 mL/min — ABNORMAL LOW (ref >60)
Glucose, Bld: 103 mg/dL — ABNORMAL HIGH (ref 70–99)
Potassium: 4.5 mmol/L (ref 3.5–5.1)
Sodium: 142 mmol/L (ref 135–145)
Total Bilirubin: 0.9 mg/dL (ref 0.3–1.2)
Total Protein: 5.4 g/dL — ABNORMAL LOW (ref 6.5–8.1)

## 2022-08-04 LAB — CBC
HCT: 26.2 % — ABNORMAL LOW (ref 39.0–52.0)
Hemoglobin: 8.3 g/dL — ABNORMAL LOW (ref 13.0–17.0)
MCH: 31 pg (ref 26.0–34.0)
MCHC: 31.7 g/dL (ref 30.0–36.0)
MCV: 97.8 fL (ref 80.0–100.0)
Platelets: 129 10*3/uL — ABNORMAL LOW (ref 150–400)
RBC: 2.68 MIL/uL — ABNORMAL LOW (ref 4.22–5.81)
RDW: 27.2 % — ABNORMAL HIGH (ref 11.5–15.5)
WBC: 7.2 10*3/uL (ref 4.0–10.5)
nRBC: 0.6 % — ABNORMAL HIGH (ref 0.0–0.2)

## 2022-08-04 LAB — GLUCOSE, CAPILLARY
Glucose-Capillary: 116 mg/dL — ABNORMAL HIGH (ref 70–99)
Glucose-Capillary: 148 mg/dL — ABNORMAL HIGH (ref 70–99)
Glucose-Capillary: 118 mg/dL — ABNORMAL HIGH (ref 70–99)
Glucose-Capillary: 106 mg/dL — ABNORMAL HIGH (ref 70–99)

## 2022-08-04 LAB — TYPE AND SCREEN
Unit division: 0
Unit division: 0
Unit division: 0

## 2022-08-04 LAB — BPAM RBC
Blood Product Expiration Date: 202408062359
Blood Product Expiration Date: 202408062359
ISSUE DATE / TIME: 202407052216
Unit Type and Rh: 600
Unit Type and Rh: 600
Unit Type and Rh: 600
Unit Type and Rh: 600

## 2022-08-04 LAB — HEMOGLOBIN AND HEMATOCRIT, BLOOD
HCT: 26.6 % — ABNORMAL LOW (ref 39.0–52.0)
HCT: 26.7 % — ABNORMAL LOW (ref 39.0–52.0)
Hemoglobin: 8.3 g/dL — ABNORMAL LOW (ref 13.0–17.0)
Hemoglobin: 8.2 g/dL — ABNORMAL LOW (ref 13.0–17.0)

## 2022-08-04 LAB — MAGNESIUM: Magnesium: 2.1 mg/dL (ref 1.7–2.4)

## 2022-08-04 NOTE — Progress Notes (Signed)
Rapid Response Event Note   Reason for Call : pt having cardiac pauses on monitor   Initial Focused Assessment: Pt A/O, F/C, See flowsheet for VS.  Able to follow simple commands.  Upon further evaluation, found pt leads not to be counting correctly.  Breath sounds bil. Clear and decreased.     Interventions: replaced pt leads, [facility], NP at bedside to evaluate pt also.  Labs orders placed, per NP.  Pt is in controlled a-fib.   Plan of Care: pt will remain in current location, HR is in the 70's    Event Summary:   MD Notified: yes  Conley Rolls, RN

## 2022-08-04 NOTE — Progress Notes (Signed)
PROGRESS NOTE    Carl Woods  ZOX:[id number] DOB: 28-Jun-1931 DOA: 08/01/2022 PCP: Carl Fick, MD    Brief Narrative:   Carl Woods is a 89 y.o. male with past medical history significant for paroxysmal atrial fibrillation, ampullary carcinoma, history of AVMs, sacral angiodysplasia, BPH, CKD stage IIIb, history of diverticulosis, hypothyroidism, OSA, obesity, BPH and recent hospitalization for GI bleed secondary to prepyloric ulcer s/p clipping who presented to [facility] ED on 7/5 from home for altered mental status.  Patient recently discharged from SNF rehab Monday in which she is currently living at home alone.  Family noted that he was becoming more confused, weak and drowsy, fatigue on Thursday.  Daughter also noted patient with black stools.  Patient apparently has been off of his Eliquis since previous hospitalization.  In the ED, patient was lethargic, confused; slightly arousable but falls back asleep.  Temperature 98.0 F, HR 93, RR 15, BP 130/49, SpO2 100% on room air.  WBC 5.6, hemoglobin 5.0, MCV 117.1, platelet count 137.  Sodium 143, potassium 4.1, chloride 114, CO2 21, glucose 135, BUN 32, creatinine 1.53.  High sensitive troponin 51 followed by 43.  Urinalysis unrevealing.  FOBT positive.  CT head without contrast with no acute intracranial abnormality.  GI was consulted.  2 unit PRBCs ordered.  TRH consulted for admission for further evaluation and management of recurrent GI bleed.  Assessment & Plan:   Acute metabolic encephalopathy Etiology likely secondary to significant anemia secondary to GI bleed versus infectious with cholangitis noted with purulent bile on EGD.  CT head without contrast with no acute findings. 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  Hemoglobin noted to be 5.0 with MCV of 117.1.  FOBT positive.  Anemia panel with iron 139, TIBC 228, ferritin 103, folate 10.9, vitamin B12 451.  GI was consulted and patient underwent EGD on 08/02/2022 with findings of gastric antral vascular ectasia with bleeding s/p APC, single nonbleeding angiectasia jejunum treated with APC. -- Hgb 5.0>7.0>9.3>10.2>9.6>9.4>8.3>8.3 -- s/p 2 u pRBC 7/5, 2 u pRBC 7/6 -- Continue Protonix drip x 72 hours then Protonix 40mg  IV q12h -- H&H every 12 hours -- CBC daily -- GI plans ERCP with bare-metal stent placement on 7/10  Hx ampullary cancer s/p ERCP with plastic stent placement Cholangitis Patient with previous ERCP with plastic stent placement was planned to have repeat ERCP soon for metal stent placement.  On EGD 7/6; plastic stent no longer visualized and prolonged observation of the ampullary area revealed passage of purulent bile.  May need stent replacement with metal stent, timing per GI. -- Zosyn, plan 7-day course -- CBC daily  Hypokalemia Hypomagnesemia Potassium 4.5 today -- Repeat electrolytes in a.m.  Hypothyroidism -- Levothyroxine 112 mcg p.o. daily  Essential hypertension At baseline on metoprolol succinate 12.5 mg p.o. daily, Bumex 1 mg p.o. daily. -- Borderline hypotensive, hold antihypertensives for now -- Continue monitor BP closely  CKD stage IIIb Creatinine 1.55>1.58, stable. --BMP daily  BPH Not on medication outpatient. --Continue monitor urine output  Obesity Body mass index is 32.85 kg/m.  Discussed with patient needs for aggressive lifestyle changes/weight loss as this complicates all facets of care.  Outpatient follow-up with PCP.    Goals of care: Patient now with recurrent hospitalization, per GI; Dr. Myrtie Neither may  not have more to offer this patient unfortunately.  Overall prognosis poor.   -- Palliative care following, appreciate assistance   DVT prophylaxis: SCDs Start: 08/01/22  2125    Code Status: Full Code Family Communication: No family present at bedside this morning  Disposition Plan:  Level of care: Telemetry Status is: Inpatient Remains inpatient appropriate because: IV antibiotics, need to ensure stability of hemoglobin    Consultants:  [facility]  Procedures:  EGD  Antimicrobials:  Zosyn 7/6>>   Subjective: Patient seen examined at bedside, resting calmly.  Lying in bed.  Son present at bedside.  Alert, hemoglobin stable, 8.3 this morning.  Seen by GI with plan of ERCP with bare-metal stent placement on Wednesday. Patient denies headache, no dizziness, no chest pain, no shortness of breath, no abdominal pain.  No acute concerns this morning per nursing staff.   Objective: Vitals:   08/03/22 2044 08/04/22 0045 08/04/22 0558 08/04/22 1406  BP: (!) 130/44 (!) 117/55 126/62 (!) 111/54  Pulse: (!) 54 65 (!) 102 70  Resp: 14 16 14    Temp: 98.6 F (37 C) 97.9 F (36.6 C) 97.9 F (36.6 C) 98.1 F (36.7 C)  TempSrc: Oral Oral Oral Oral  SpO2: 99% 100% 100% 96%  Weight:      Height:        Intake/Output Summary (Last 24 hours) at 08/04/2022 1412 Last data filed at 08/04/2022 1300 Gross per 24 hour  Intake 1261.65 ml  Output 777 ml  Net 484.65 ml   Filed Weights   08/01/22 1402 08/02/22 1052  Weight: 92 kg 98 kg    Examination:  Physical Exam: GEN: NAD, alert and oriented x 3, chronically ill in appearance HEENT: NCAT, PERRL, EOMI, sclera clear, dry mucous membranes PULM: CTAB w/o wheezes/crackles, normal respiratory effort, on room air CV: RRR w/o M/G/R GI: abd soft, NTND, NABS, no R/G/M MSK: 2+ pitting edema bilateral lower extremities up to mid shin, chronic venous changes/skin changes noted to bilateral lower extremities, moves all extremities independently NEURO: No focal neurological deficits appreciated Integumentary: No concerning rashes/lesions/wounds noted on exposed skin surfaces.    Data Reviewed: I  have personally reviewed following labs and imaging studies  CBC: Recent Labs  Lab 08/01/22 1538 08/02/22 0311 08/02/22 2009 08/03/22 0252 08/03/22 1303 08/04/22 0455 08/04/22 1046  WBC 5.6  --   --  6.6  --  7.2  --   NEUTROABS 3.8  --   --   --   --   --   --   HGB 5.0*   < > 10.2* 9.6* 9.4* 8.3* 8.3*  HCT 16.4*   < > 32.0* 30.2* 29.1* 26.2* 26.6*  MCV 117.1*  --   --  96.2  --  97.8  --   PLT 137*  --   --  137*  --  129*  --    < > = values in this interval not displayed.   Basic Metabolic Panel: Recent Labs  Lab 08/01/22 1228 08/01/22 1538 08/02/22 0311 08/03/22 0252 08/04/22 0301  NA 145 143 145 144 142  K 4.2 4.1 4.4 3.4* 4.5  CL 113* 114* 114* 114* 117*  CO2 21* 21* 20* 19* 20*  GLUCOSE 128* 135* 117* 124* 103*  BUN 30* 32* 32* 32* 31*  CREATININE 1.57* 1.53* 1.55* 1.58* 1.69*  CALCIUM 8.7* 8.3* 8.3* 8.2* 7.9*  MG  --   --   --  1.4* 2.1  PHOS  --   --   --  3.2  --    GFR: Estimated Creatinine Clearance: 33.6 mL/min (A) (by C-G formula based on SCr of 1.69 mg/dL (H)). Liver Function Tests: Recent Labs  Lab 08/01/22 1228 08/03/22 0252 08/04/22 0301  AST 28 28 23   ALT 15 17 18   ALKPHOS 68 60 56  BILITOT 0.7 1.0 0.9  PROT 5.4* 5.3* 5.4*  ALBUMIN 3.0* 2.5* 2.3*   No results for input(s): "LIPASE", "AMYLASE" in the last 168 hours. No results for input(s): "AMMONIA" in the last 168 hours. Coagulation Profile: Recent Labs  Lab 08/01/22 1538  INR 1.2   Cardiac Enzymes: No results for input(s): "CKTOTAL", "CKMB", "CKMBINDEX", "TROPONINI" in the last 168 hours. BNP (last 3 results) No results for input(s): "PROBNP" in the last 8760 hours. HbA1C: Recent Labs    08/02/22 0311  HGBA1C 4.4*   CBG: Recent Labs  Lab 08/03/22 1132 08/03/22 1720 08/03/22 2046 08/04/22 0756 08/04/22 1244  GLUCAP 126* 107* 121* 116* 148*   Lipid Profile: No results for input(s): "CHOL", "HDL", "LDLCALC", "TRIG", "CHOLHDL", "LDLDIRECT" in the last 72  hours. Thyroid Function Tests: No results for input(s): "TSH", "T4TOTAL", "FREET4", "T3FREE", "THYROIDAB" in the last 72 hours. Anemia Panel: Recent Labs    08/02/22 0311 08/02/22 1416  VITAMINB12  --  451  FOLATE  --  10.9  FERRITIN  --  103  TIBC  --  288  IRON  --  139  RETICCTPCT 4.8*  --    Sepsis Labs: No results for input(s): "PROCALCITON", "LATICACIDVEN" in the last 168 hours.  Recent Results (from the past 240 hour(s))  MRSA Next Gen by PCR, Nasal     Status: None   Collection Time: 08/01/22 11:17 PM   Specimen: Nasal Mucosa; Nasal Swab  Result Value Ref Range Status   MRSA by PCR Next Gen NOT DETECTED NOT DETECTED Final    Comment: (NOTE) The GeneXpert MRSA Assay (FDA approved for NASAL specimens only), is one component of a comprehensive MRSA colonization surveillance program. It is not intended to diagnose MRSA infection nor to guide or monitor treatment for MRSA infections. Test performance is not FDA approved in patients less than 74 years old. Performed at [facility], [address] [place], [place] [address]          Radiology Studies: No results found.      Scheduled Meds:  Chlorhexidine Gluconate Cloth  6 each Topical Daily   cholecalciferol  5,000 Units Oral QPM   insulin aspart  0-5 Units Subcutaneous QHS   insulin aspart  0-9 Units Subcutaneous TID WC   levothyroxine  112 mcg Oral Q0600   [START ON 08/05/2022] pantoprazole  40 mg Intravenous Q12H   sodium chloride flush  3 mL Intravenous Q12H   Continuous Infusions:  pantoprazole 8 mg/hr (08/04/22 0607)   piperacillin-tazobactam (ZOSYN)  IV 3.375 g (08/04/22 1405)     LOS: 3 days    Time spent: 52 minutes spent on chart review, discussion with nursing staff, consultants, updating family and interview/physical exam; more than 50% of that time was spent in counseling and/or coordination of care.    [name] [name], DO [facility] Available via Epic  secure chat 7am-7pm After these hours, please refer to coverage provider listed on [email or web address] 08/04/2022, 2:12 PM

## 2022-08-04 NOTE — Progress Notes (Signed)
OT Cancellation Note  Patient Details Name: Carl Woods MRN: [id number] DOB: 03-14-1931   Cancelled Treatment:    Reason Eval/Treat Not Completed: Patient declined, no reason specified Patient declined reporting that he needed to go down for a test and was currently NPO for test. OT to continue to follow  Rosalio Loud, MS Acute Rehabilitation Department Office# [phone]  08/04/2022, 2:26 PM

## 2022-08-04 NOTE — Progress Notes (Addendum)
Progress Note   Subjective  Chief Complaint: GI bleeding with anemia  Today, patient has been moved to the floor and is doing much better per family by his bedside.  His mental status is much improved and he is eating his breakfast.  He really denies any acute complaints or concerns.  Family are aware that he will likely need repeat ERCP with metal stent in the future.  No new complaints or concerns.   Objective   Vital signs in last 24 hours: Temp:  [97.6 F (36.4 C)-98.6 F (37 C)] 97.9 F (36.6 C) (07/08 0558) Pulse Rate:  [54-102] 102 (07/08 0558) Resp:  [14-44] 14 (07/08 0558) BP: (115-139)/(37-62) 126/62 (07/08 0558) SpO2:  [99 %-100 %] 100 % (07/08 0558) Last BM Date : 08/03/22 General:    Elderly white male in NAD Heart:  Regular rate and rhythm; no murmurs Lungs: Respirations even and unlabored, lungs CTA bilaterally Abdomen:  Soft, nontender and nondistended. Normal bowel sounds. Psych:  Cooperative. Normal mood and affect.  Intake/Output from previous day: 07/07 0701 - 07/08 0700 In: 945.8 [P.O.:540; I.V.:209.4; IV Piggyback:196.4] Out: 1376 [Urine:1375; Stool:1]   Lab Results: Recent Labs    08/01/22 1538 08/02/22 0311 08/03/22 0252 08/03/22 1303 08/04/22 0455  WBC 5.6  --  6.6  --  7.2  HGB 5.0*   < > 9.6* 9.4* 8.3*  HCT 16.4*   < > 30.2* 29.1* 26.2*  PLT 137*  --  137*  --  129*   < > = values in this interval not displayed.   BMET Recent Labs    08/02/22 0311 08/03/22 0252 08/04/22 0301  NA 145 144 142  K 4.4 3.4* 4.5  CL 114* 114* 117*  CO2 20* 19* 20*  GLUCOSE 117* 124* 103*  BUN 32* 32* 31*  CREATININE 1.55* 1.58* 1.69*  CALCIUM 8.3* 8.2* 7.9*   LFT Recent Labs    08/04/22 0301  PROT 5.4*  ALBUMIN 2.3*  AST 23  ALT 18  ALKPHOS 56  BILITOT 0.9   PT/INR Recent Labs    08/01/22 1538  LABPROT 15.0  INR 1.2    Studies/Results: DG Abd Portable 2V  Result Date: 08/02/2022 CLINICAL DATA:  Biliary stent migration. EGD  today. Previously placed biliary stent was not visible via scope. EXAM: PORTABLE ABDOMEN - 2 VIEW COMPARISON:  CT 07/07/2022 FINDINGS: The previously seen biliary stent has migrated and is now located in the right lower quadrant. This could be within the distal small bowel or proximal colon. No free air, organomegaly, or bowel obstruction. No confluent opacity in the lungs. IMPRESSION: Biliary stent has migrated into the right lower quadrant, possibly within the distal small bowel or proximal colon. Electronically Signed   By: Charlett Nose M.D.   On: 08/02/2022 17:22     Assessment / Plan:    Assessment: 1.  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  Acute blood loss anemia: With above 3.  Altered mental status: Improved now posttransfusion antibiotic treatment for suspected low-grade cholangitis.  Bile seen flowing from the ampullary orifice during EGD on 08/02/2022 4.  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  Patient's hemoglobin has remained stable. 2.  Again at some point patient will need repeat ERCP with metal biliary stent placement, timing per Dr. Chales Abrahams 3.  For now continue  supportive measures and current diet 4.  Continue to monitor hemoglobin and transfusion as needed less than 7 5.  Continue PPI  Thank you for your kind consultation, we will continue to follow along.    LOS: 3 days   Unk Lightning  08/04/2022, 9:51 AM  Addendum:  Tentative plan for ERCP on Wednesday, 08/06/2022 as long as patient/family agreeable and stable.  Hyacinth Meeker, PA-C    Attending physician's note   I have taken history, reviewed the chart and examined the patient. I performed a substantive portion of this encounter, including complete performance of at least one of the key components, in conjunction with the APP. I agree with  the Advanced Practitioner's note, impression and recommendations.   Received signout from Dr. Myrtie Neither.  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 Treated endoscopically w/t APC on 7/6   1 episode of melena this AM. Hb being checked Q8hrs - stable 8.3 No abdo pain or s/s of asc cholangitis. Had 5beat run of Vtach- asymptomatic  Plan: -Continue to monitor Hb/Hct. Keep Hb>8 -Continue IV Protonix/IV Zosyn -ERCP with metal stenting tentatively 7/9 if stable and family agreeable.   Edman Circle, MD [facility] [phone]

## 2022-08-04 NOTE — Progress Notes (Signed)
       Overnight   NAME: Carl Woods MRN: [id number] DOB : July 31, 1931    Date of Service   08/04/2022   HPI/Events of Note    Notified by RN for bradycardia. Reported as in 20s-30s On arrival at bedside patient was alert and oriented and had just had a large stool. He is A&O x4 ECG tracing was not optimal on tele device  Replaced/repositioned leads etc and found HR to be maintaining in the 60-70s range    Interventions/ Plan   Potentially improperly functioning leads or electrodes Potentially vagal response       Appears in no obvious or stated distress, Awake and Oriented  Continue previous orders      Chinita Greenland BSN MSNA MSN ACNPC-AG Acute Care Nurse Practitioner [facility]

## 2022-08-04 NOTE — Progress Notes (Signed)
Carl Woods   DOB:[birth year]   WU#:[id number]   GNF#:[id number]  Med/onc follow up   Subjective: Patient has overall improved over the weekend, he is alert, oriented, still feel very fatigued.  He underwent EGD 2 days ago, and is scheduled to have ERCP and placement by GI in 2 days.  Son at bedside.   Objective:  Vitals:   08/04/22 1406 08/04/22 1942  BP: (!) 111/54 125/67  Pulse: 70 66  Resp:  18  Temp: 98.1 F (36.7 C) 97.8 F (36.6 C)  SpO2: 96% 100%    Body mass index is 32.85 kg/m.  Intake/Output Summary (Last 24 hours) at 08/04/2022 2053 Last data filed at 08/04/2022 1843 Gross per 24 hour  Intake 1043 ml  Output 1375 ml  Net -332 ml     Sclerae unicteric  Oropharynx clear  Abdomen benign  MSK no focal spinal tenderness, no peripheral edema  Neuro nonfocal, patient is lethargic.    CBG (last 3)  Recent Labs    08/04/22 1244 08/04/22 1655 08/04/22 2046  GLUCAP 148* 118* 106*     Labs:   Urine Studies No results for input(s): "UHGB", "CRYS" in the last 72 hours.  Invalid input(s): "UACOL", "UAPR", "USPG", "UPH", "UTP", "UGL", "UKET", "UBIL", "UNIT", "UROB", "ULEU", "UEPI", "UWBC", "URBC", "UBAC", "CAST", "UCOM", "BILUA"  Basic Metabolic Panel: Recent Labs  Lab 08/01/22 1228 08/01/22 1538 08/02/22 0311 08/03/22 0252 08/04/22 0301  NA 145 143 145 144 142  K 4.2 4.1 4.4 3.4* 4.5  CL 113* 114* 114* 114* 117*  CO2 21* 21* 20* 19* 20*  GLUCOSE 128* 135* 117* 124* 103*  BUN 30* 32* 32* 32* 31*  CREATININE 1.57* 1.53* 1.55* 1.58* 1.69*  CALCIUM 8.7* 8.3* 8.3* 8.2* 7.9*  MG  --   --   --  1.4* 2.1  PHOS  --   --   --  3.2  --    GFR Estimated Creatinine Clearance: 33.6 mL/min (A) (by C-G formula based on SCr of 1.69 mg/dL (H)). Liver Function Tests: Recent Labs  Lab 08/01/22 1228 08/03/22 0252 08/04/22 0301  AST 28 28 23   ALT 15 17 18   ALKPHOS 68 60 56  BILITOT 0.7 1.0 0.9  PROT 5.4* 5.3* 5.4*  ALBUMIN 3.0* 2.5* 2.3*   No results for  input(s): "LIPASE", "AMYLASE" in the last 168 hours. No results for input(s): "AMMONIA" in the last 168 hours. Coagulation profile Recent Labs  Lab 08/01/22 1538  INR 1.2    CBC: Recent Labs  Lab 08/01/22 1538 08/02/22 0311 08/03/22 0252 08/03/22 1303 08/04/22 0455 08/04/22 1046 08/04/22 1949  WBC 5.6  --  6.6  --  7.2  --   --   NEUTROABS 3.8  --   --   --   --   --   --   HGB 5.0*   < > 9.6* 9.4* 8.3* 8.3* 8.2*  HCT 16.4*   < > 30.2* 29.1* 26.2* 26.6* 26.7*  MCV 117.1*  --  96.2  --  97.8  --   --   PLT 137*  --  137*  --  129*  --   --    < > = values in this interval not displayed.   Cardiac Enzymes: No results for input(s): "CKTOTAL", "CKMB", "CKMBINDEX", "TROPONINI" in the last 168 hours. BNP: Invalid input(s): "POCBNP" CBG: Recent Labs  Lab 08/03/22 2046 08/04/22 0756 08/04/22 1244 08/04/22 1655 08/04/22 2046  GLUCAP 121* 116* 148* 118* 106*  D-Dimer No results for input(s): "DDIMER" in the last 72 hours. Hgb A1c Recent Labs    08/02/22 0311  HGBA1C 4.4*   Lipid Profile No results for input(s): "CHOL", "HDL", "LDLCALC", "TRIG", "CHOLHDL", "LDLDIRECT" in the last 72 hours. Thyroid function studies No results for input(s): "TSH", "T4TOTAL", "T3FREE", "THYROIDAB" in the last 72 hours.  Invalid input(s): "FREET3" Anemia work up Recent Labs    08/02/22 0311 08/02/22 1416  VITAMINB12  --  451  FOLATE  --  10.9  FERRITIN  --  103  TIBC  --  288  IRON  --  139  RETICCTPCT 4.8*  --    Microbiology Recent Results (from the past 240 hour(s))  MRSA Next Gen by PCR, Nasal     Status: None   Collection Time: 08/01/22 11:17 PM   Specimen: Nasal Mucosa; Nasal Swab  Result Value Ref Range Status   MRSA by PCR Next Gen NOT DETECTED NOT DETECTED Final    Comment: (NOTE) The GeneXpert MRSA Assay (FDA approved for NASAL specimens only), is one component of a comprehensive MRSA colonization surveillance program. It is not intended to diagnose MRSA  infection nor to guide or monitor treatment for MRSA infections. Test performance is not FDA approved in patients less than 43 years old. Performed at [facility], [address], [place], [place] [address]       Studies:  No results found.  Assessment: 89 y.o. male   Recurrent severe anemia, secondary to GI bleeding  Acute Metabolic encephalopathy, probably secondary to severe anemia and dehydration, much improved Leg edema  AF HTN CKD stage III    Plan:  -Repeated EGD on August 02, 2022 showed gastric antral vascular ectasia with bleeding, and a single nonbleeding angioectasia in the duodenum, both were treated with APC.  Decreased mucosa erythema friability and granularity found in the duodenal bulb, biopsy was taken to rule out malignancy.  Some also mucosal changes in stomach and duodenum possibly related to radiation. -pt has clinically improved, hemoglobin slightly dropped this morning compared to yesterday.  Patient is certainly at high risk for recurrent GI bleeding, he was previously on anticoagulation which has been held.  He is not a candidate for anticoagulation due to recurrent history of GI bleeding. -Given his advanced age, and multiple medical comorbidities, he may not recover very well.  His son voiced good understanding about the prognosis. -Will continue supportive care.  If he gets discharged to home and wants to continue medical care, I will be happy to monitor his blood counts and to blood transfusion as needed in clinic.  Pt and his son appreciated that. -I will f/u as needed.     Malachy Mood, MD 08/04/2022

## 2022-08-04 NOTE — Progress Notes (Signed)
  Daily Progress Note   Patient Name: Carl Woods       Date: 08/04/2022 DOB: 12-28-1931  Age: 89 y.o. MRN#: [id number] Attending Physician: [name], Eric J, DO Primary Care Physician: [name], MD Admit Date: 08/01/2022 Length of Stay: 3 days  Patient last seen by this palliative provider on 08/04/22. Met with patient while three children (2 sons and 2 daughter) at bedside. Tried to express concerns over patient's multiple medical coomorbities and worsening functional status. Tried to encourage conversations regarding planning for possible medical pathways moving forward.  Patient and family hopeful for further interventions after discussions had with GI. Patient's family has now reversed code status to full code and are seeking further aggressive medical care. PMT will continue to follow along peripherally. Please reach out if acute PMT needs arise. Thank you.    [name], DO Palliative Care Provider PMT # [phone]

## 2022-08-04 NOTE — Progress Notes (Addendum)
Per telemetry, pt's HR has been dropping to 20's-30's. Pt is alert and oriented.pt denies pain.  Notified Hospitalist and Rapid team. New orders received.  Pt is stable at this time.

## 2022-08-05 ENCOUNTER — Encounter: Payer: Self-pay | Admitting: Gastroenterology

## 2022-08-05 DIAGNOSIS — R4182 Altered mental status, unspecified: Secondary | ICD-10-CM | POA: Diagnosis not present

## 2022-08-05 DIAGNOSIS — D62 Acute posthemorrhagic anemia: Secondary | ICD-10-CM | POA: Diagnosis not present

## 2022-08-05 DIAGNOSIS — K922 Gastrointestinal hemorrhage, unspecified: Secondary | ICD-10-CM | POA: Diagnosis not present

## 2022-08-05 DIAGNOSIS — G9340 Encephalopathy, unspecified: Secondary | ICD-10-CM | POA: Diagnosis not present

## 2022-08-05 DIAGNOSIS — K5520 Angiodysplasia of colon without hemorrhage: Secondary | ICD-10-CM | POA: Diagnosis not present

## 2022-08-05 LAB — CBC
HCT: 25.7 % — ABNORMAL LOW (ref 39.0–52.0)
Hemoglobin: 8 g/dL — ABNORMAL LOW (ref 13.0–17.0)
MCH: 31 pg (ref 26.0–34.0)
MCHC: 31.1 g/dL (ref 30.0–36.0)
MCV: 99.6 fL (ref 80.0–100.0)
Platelets: 130 10*3/uL — ABNORMAL LOW (ref 150–400)
RBC: 2.58 MIL/uL — ABNORMAL LOW (ref 4.22–5.81)
RDW: 26.9 % — ABNORMAL HIGH (ref 11.5–15.5)
WBC: 7 10*3/uL (ref 4.0–10.5)
nRBC: 0.6 % — ABNORMAL HIGH (ref 0.0–0.2)

## 2022-08-05 LAB — COMPREHENSIVE METABOLIC PANEL
ALT: 16 U/L (ref 0–44)
AST: 20 U/L (ref 15–41)
Albumin: 2.2 g/dL — ABNORMAL LOW (ref 3.5–5.0)
Alkaline Phosphatase: 49 U/L (ref 38–126)
Anion gap: 8 (ref 5–15)
BUN: 33 mg/dL — ABNORMAL HIGH (ref 8–23)
CO2: 21 mmol/L — ABNORMAL LOW (ref 22–32)
Calcium: 8.2 mg/dL — ABNORMAL LOW (ref 8.9–10.3)
Chloride: 109 mmol/L (ref 98–111)
Creatinine, Ser: 1.68 mg/dL — ABNORMAL HIGH (ref 0.61–1.24)
GFR, Estimated: 39 mL/min — ABNORMAL LOW (ref >60)
Glucose, Bld: 97 mg/dL (ref 70–99)
Potassium: 4.1 mmol/L (ref 3.5–5.1)
Sodium: 138 mmol/L (ref 135–145)
Total Bilirubin: 0.8 mg/dL (ref 0.3–1.2)
Total Protein: 5.2 g/dL — ABNORMAL LOW (ref 6.5–8.1)

## 2022-08-05 LAB — GLUCOSE, CAPILLARY
Glucose-Capillary: 87 mg/dL (ref 70–99)
Glucose-Capillary: 124 mg/dL — ABNORMAL HIGH (ref 70–99)
Glucose-Capillary: 104 mg/dL — ABNORMAL HIGH (ref 70–99)
Glucose-Capillary: 111 mg/dL — ABNORMAL HIGH (ref 70–99)

## 2022-08-05 LAB — SURGICAL PATHOLOGY

## 2022-08-05 LAB — MAGNESIUM: Magnesium: 2 mg/dL (ref 1.7–2.4)

## 2022-08-05 LAB — HEMOGLOBIN AND HEMATOCRIT, BLOOD
HCT: 26.6 % — ABNORMAL LOW (ref 39.0–52.0)
Hemoglobin: 8.2 g/dL — ABNORMAL LOW (ref 13.0–17.0)

## 2022-08-05 NOTE — Progress Notes (Addendum)
    Progress Note   Subjective  Chief Complaint: GI bleeding with anemia, known ampullary cancer  Patient found doing well this morning, eating his breakfast.  No acute complaints or concerns.   Objective   Vital signs in last 24 hours: Temp:  [97.8 F (36.6 C)-98.1 F (36.7 C)] 97.9 F (36.6 C) (07/09 0506) Pulse Rate:  [66-84] 84 (07/09 0506) Resp:  [18] 18 (07/09 0506) BP: (111-125)/(54-67) 119/57 (07/09 0506) SpO2:  [96 %-100 %] 99 % (07/09 0506) Last BM Date : 08/04/22 General:    Elderly, chronically ill appearing male in NAD Heart:  Regular rate and rhythm; no murmurs Lungs: Respirations even and unlabored, lungs CTA bilaterally Abdomen:  Soft, nontender and nondistended. Normal bowel sounds. Psych:  Cooperative. Normal mood and affect.  Intake/Output from previous day: 07/08 0701 - 07/09 0700 In: 1764.6 [P.O.:1220; I.V.:303.2; IV Piggyback:241.4] Out: 1700 [Urine:1700]   Lab Results: Recent Labs    08/03/22 0252 08/03/22 1303 08/04/22 0455 08/04/22 1046 08/04/22 1949 08/05/22 0614  WBC 6.6  --  7.2  --   --  7.0  HGB 9.6*   < > 8.3* 8.3* 8.2* 8.0*  HCT 30.2*   < > 26.2* 26.6* 26.7* 25.7*  PLT 137*  --  129*  --   --  130*   < > = values in this interval not displayed.   BMET Recent Labs    08/03/22 0252 08/04/22 0301 08/05/22 0614  NA 144 142 138  K 3.4* 4.5 4.1  CL 114* 117* 109  CO2 19* 20* 21*  GLUCOSE 124* 103* 97  BUN 32* 31* 33*  CREATININE 1.58* 1.69* 1.68*  CALCIUM 8.2* 7.9* 8.2*   LFT Recent Labs    08/05/22 0614  PROT 5.2*  ALBUMIN 2.2*  AST 20  ALT 16  ALKPHOS 49  BILITOT 0.8    Assessment / Plan:   Assessment: 1.  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  Acute blood loss anemia 3.  Altered mental status: Improved now posttransfusion antibiotic  treatment for suspected low-grade cholangitis, bile seen flowing from the ampullary orifice during EGD in 08/02/2022 4.  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  Again hemoglobin remained stable 2.  Plans for repeat ERCP tomorrow 08/06/2022 with metal biliary stent placement (there is a small possibility this may need to be bumped to 7/11, pending anesthesia availability) 3.  Continue regular diet today and n.p.o. at midnight 4.  Continue PPI 5.  Continue to monitor hemoglobin and transfusion as needed less than 8  Thank you for your kind consultation, we will continue to follow   LOS: 4 days   Carl Woods  08/05/2022, 9:36 AM     Attending physician's note   I have taken history, reviewed the chart and examined the patient. I performed a substantive portion of this encounter, including complete performance of at least one of the key components, in conjunction with the APP. I agree with the Advanced Practitioner's note, impression and recommendations.   Recheck CBC in AM. If <8, transfuse 1U ERCP w/t stent in AM.  Continue antibiotics/Protonix Discussed risks and benefits including risks of pancreatitis, bleeding, perforation.  Benefits were also discussed.   Raj Ioannis Schuh, MD  GI [phone]  

## 2022-08-05 NOTE — Evaluation (Signed)
Occupational Therapy Evaluation Patient Details Name: Carl Woods MRN: [id number] DOB: [birth year] Today's Date: 08/05/2022   History of Present Illness Pt is a 89 year old male admitted 08/01/22 for acute metabolic encephalopathy, acute blood loss anemia, melena. Past medical history significant for paroxysmal atrial fibrillation, ampullary carcinoma, history of AVMs, sacral angiodysplasia, BPH, CKD stage IIIb, history of diverticulosis, hypothyroidism, OSA, obesity, BPH and recent hospitalization for GI bleed secondary to prepyloric ulcer s/p clipping. Patient recently discharged from SNF rehab Monday in which he is currently living at home alone. ERCP scheduled for 7/10.   Clinical Impression   Patient is currently requiring assistance with ADLs including up to moderate assist with Lower body ADLs, up to minimal assist with Upper body ADLs,  as well as  minimal assist with bed mobility and moderate assist with functional transfers to toilet.  Current level of function is below patient's typical baseline.    During this evaluation, patient was limited by generalized weakness, impaired activity tolerance, and cognitive deficits slow processing, slow responses, and decreased word finding, all of which has the potential to impact patient's safety and independence during functional mobility, as well as performance for ADLs.  Patient lives alone, but plans to return to [facility] for more rehab prior to returning home.  Daughter confirmed this plan. Patient demonstrates good rehab potential, and should benefit from continued skilled occupational therapy services while in acute care to maximize safety, independence and quality of life at home.  Continued occupational therapy services are recommended.  ?       Recommendations for follow up therapy are one component of a multi-disciplinary discharge planning process, led by the attending physician.  Recommendations may be updated based on patient status,  additional functional criteria and insurance authorization.   Assistance Recommended at Discharge Frequent or constant Supervision/Assistance  Patient can return home with the following A little help with walking and/or transfers;A little help with bathing/dressing/bathroom;Assistance with cooking/housework;Assist for transportation;Help with stairs or ramp for entrance;Two people to help with walking and/or transfers    Functional Status Assessment  Patient has had a recent decline in their functional status and demonstrates the ability to make significant improvements in function in a reasonable and predictable amount of time.  Equipment Recommendations   (defer to SNF. May benefit from sock aid but unsure with hand weakness. Will assess)    Recommendations for Other Services       Precautions / Restrictions Precautions Precautions: Fall Restrictions Weight Bearing Restrictions: No      Mobility Bed Mobility Overal bed mobility: Needs Assistance Bed Mobility: Supine to Sit     Supine to sit: Min assist, HOB elevated          Transfers                          Balance Overall balance assessment: Needs assistance, History of Falls Sitting-balance support: Feet supported, No upper extremity supported Sitting balance-Leahy Scale: Fair     Standing balance support: Bilateral upper extremity supported, During functional activity, Reliant on assistive device for balance Standing balance-Leahy Scale: Poor                             ADL either performed or assessed with clinical judgement   ADL Overall ADL's : Needs assistance/impaired Eating/Feeding: Bed level;Minimal assistance   Grooming: Sitting;Set up;Supervision/safety   Upper Body Bathing: Sitting;Cueing for sequencing;Min guard  Lower Body Bathing: Moderate assistance;Sit to/from stand;Sitting/lateral leans   Upper Body Dressing : Minimal assistance;Sitting   Lower Body Dressing:  Moderate assistance;Sitting/lateral leans Lower Body Dressing Details (indicate cue type and reason): Pt able to doff socks but unable to don. Pt endorses ongoing struggles and daughter reports pt keeps house warm enough and skips his socks. Pt denied learning about a sock aid while at SNF Toilet Transfer: Moderate assistance;Rolling walker (2 wheels);Cueing for sequencing Toilet Transfer Details (indicate cue type and reason): Pt performed stand-step transfer to recliner by standing from partially elevated EOB to RW with Mod As. Small steps to chair with cues to back up sufficiently and assistance to keep RW close enough. Min As to control descent to chair. Toileting- Clothing Manipulation and Hygiene: Moderate assistance;Sit to/from stand       Functional mobility during ADLs: Moderate assistance;Minimal assistance;Rolling walker (2 wheels)       Vision   Additional Comments: Pt showing decreased visual attention to tasks and people.     Perception     Praxis      Pertinent Vitals/Pain Pain Assessment Pain Assessment: No/denies pain     Hand Dominance Left   Extremity/Trunk Assessment Upper Extremity Assessment Upper Extremity Assessment: RUE deficits/detail;LUE deficits/detail;Generalized weakness RUE Deficits / Details: ROM: Shoulder limitations for IR sufficient for hygiene. Shoulder and grip weakness with elbow 5/5. RUE Coordination: decreased fine motor LUE Deficits / Details: ROM: Shoulder limitations for IR sufficient for hygiene. Shoulder and grip weakness with elbow 5/5. LUE Coordination: decreased fine motor   Lower Extremity Assessment Lower Extremity Assessment: Generalized weakness   Cervical / Trunk Assessment Cervical / Trunk Assessment: Kyphotic   Communication Communication Communication: No difficulties   Cognition Arousal/Alertness: Awake/alert   Overall Cognitive Status: Within Functional Limits for tasks assessed Area of Impairment: Problem  solving                             Problem Solving: Slow processing, Requires verbal cues General Comments: Pt's daughter in room and does enodirse pt withsome decreased word finding and slwoer processing than his baseline     General Comments       Exercises     Shoulder Instructions      Home Living Family/patient expects to be discharged to:: Private residence Living Arrangements: Spouse/significant other Available Help at Discharge: Family Type of Home: House Home Access: Stairs to enter Secretary/administrator of Steps: 1+1   Home Layout: One level;Able to live on main level with bedroom/bathroom;Full bath on main level     Bathroom Shower/Tub: Producer, television/film/video: Handicapped height Bathroom Accessibility: Yes How Accessible: Accessible via walker Home Equipment: Shower seat;Grab bars - tub/shower;Rolling Walker (2 wheels);Rollator (4 wheels);Hand held shower head   Additional Comments: pt's wife Okey Regal has dementia, pt's son lives in [place] part time and pt has a daughter in [place]. States his kids are all helping with pt's wife and staying overnight.      Prior Functioning/Environment Prior Level of Function : Independent/Modified Independent             Mobility Comments: ambulatory with RW in home, only home from rehab a few days prior to this current admission ADLs Comments: independent with basic ADLs, not driving (since recent SNF rehab stay)        OT Problem List: Decreased strength;Decreased range of motion;Decreased activity tolerance;Impaired balance (sitting and/or standing);Decreased coordination;Decreased safety awareness  OT Treatment/Interventions: Self-care/ADL training;Therapeutic exercise;Energy conservation;DME and/or AE instruction;Therapeutic activities;Patient/family education;Balance training;Cognitive remediation/compensation    OT Goals(Current goals can be found in the care plan section) Acute Rehab OT  Goals Patient Stated Goal: Get stronger OT Goal Formulation: With patient Time For Goal Achievement: 08/19/22 Potential to Achieve Goals: Good ADL Goals Pt Will Perform Eating: with adaptive utensils;with set-up;sitting Pt Will Perform Grooming: with set-up;with supervision;sitting (at EOB with good balance) Pt Will Perform Lower Body Dressing: with adaptive equipment;sitting/lateral leans;sit to/from stand;with set-up;with min guard assist Pt Will Transfer to Toilet: with supervision;ambulating Pt Will Perform Toileting - Clothing Manipulation and hygiene: with adaptive equipment;with supervision;sit to/from stand Pt/caregiver will Perform Home Exercise Program: Increased strength;Increased ROM;Right Upper extremity;Left upper extremity;With Supervision (Including grip strengthneing)  OT Frequency: Min 1X/week    Co-evaluation              AM-PAC OT "6 Clicks" Daily Activity     Outcome Measure Help from another person eating meals?: A Little Help from another person taking care of personal grooming?: A Little Help from another person toileting, which includes using toliet, bedpan, or urinal?: A Lot Help from another person bathing (including washing, rinsing, drying)?: A Lot Help from another person to put on and taking off regular upper body clothing?: A Little Help from another person to put on and taking off regular lower body clothing?: A Lot 6 Click Score: 15   End of Session Equipment Utilized During Treatment: Gait belt;Rolling walker (2 wheels) Nurse Communication: Mobility status  Activity Tolerance: Patient tolerated treatment well Patient left: in chair;with call bell/phone within reach;with chair alarm set;with family/visitor present  OT Visit Diagnosis: Unsteadiness on feet (R26.81);Other abnormalities of gait and mobility (R26.89);Muscle weakness (generalized) (M62.81);History of falling (Z91.81)                Time: 1610-9604 OT Time Calculation (min): 35  min Charges:  OT General Charges $OT Visit: 1 Visit OT Evaluation $OT Eval Low Complexity: 1 Low OT Treatments $Therapeutic Activity: 8-22 mins  Victorino Dike, OT Acute Rehab Services Office: [phone] 08/05/2022  Theodoro Clock 08/05/2022, 10:27 AM

## 2022-08-05 NOTE — Progress Notes (Signed)
Pharmacy Antibiotic Note  Carl Woods is a 89 y.o. male with hx CKD, ampullary carcinoma  and recurrent GIB who presented to the ED on 08/01/2022 with AMS. EGD on 08/02/22 showed findings with suspicion for cholangitis and with zosyn started s/p procedure per GI team's recom.  Today, 08/05/2022: - day #4 abx - scr elevated but somewhat stable at 1.68 (crcl ~34) - wbc wnl , afeb   Plan: - continue zosyn 3.375 gm IV q8h (infuse over 4 hrs)   ______________________________________________  Height: 5\' 8"  (172.7 cm) Weight: 98 kg (216 lb 0.4 oz) IBW/kg (Calculated) : 68.4  Temp (24hrs), Avg:97.9 F (36.6 C), Min:97.8 F (36.6 C), Max:98.1 F (36.7 C)  Recent Labs  Lab 08/01/22 1538 08/02/22 0311 08/03/22 0252 08/04/22 0301 08/04/22 0455 08/05/22 0614  WBC 5.6  --  6.6  --  7.2 7.0  CREATININE 1.53* 1.55* 1.58* 1.69*  --  1.68*    Estimated Creatinine Clearance: 33.8 mL/min (A) (by C-G formula based on SCr of 1.68 mg/dL (H)).    Allergies  Allergen Reactions   Nsaids Other (See Comments)    Patient is to not take these because of kidney issues    Thank you for allowing pharmacy to be a part of this patient's care.  Lucia Gaskins 08/05/2022 8:19 AM

## 2022-08-05 NOTE — [place] FL2 (Signed)
[place] MEDICAID FL2 LEVEL OF CARE FORM     IDENTIFICATION  Patient Name: Carl Woods Birthdate: 02/23/1931 Sex: male Admission Date (Current Location): 08/01/2022  [facility] and [place] Number:  Producer, television/film/video and Address:  [facility],  [address] 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/02/2022   DNR (do not resuscitate) 08/02/2022   Goals of care, counseling/discussion 08/02/2022   Counseling and coordination of care 08/02/2022   Palliative care encounter 08/02/2022   Acute encephalopathy 08/01/2022   GI bleeding 08/01/2022   Gastritis and gastroduodenitis 07/09/2022   Gastric ulcer without hemorrhage or perforation 07/09/2022   Acute blood loss anemia 07/09/2022   Acute heart failure with preserved ejection fraction (HFpEF) (HCC) 07/07/2022   Preprocedural cardiovascular examination 07/07/2022   Symptomatic anemia 07/06/2022   Non-ST elevation (NSTEMI) myocardial infarction (HCC) 07/06/2022   Elevated brain natriuretic peptide (BNP) level 07/06/2022   Pleural effusion 07/06/2022   Hypokalemia 07/06/2022   Hypothyroidism 07/06/2022   GERD (gastroesophageal reflux disease) 07/06/2022   Shortness of breath 07/06/2022   Hypervolemia 07/06/2022   AKI (acute kidney injury) (HCC) 07/06/2022   Vitamin B12 deficiency anemia 06/16/2022   Cancer of ampulla of Vater  (HCC) 01/31/2022   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   Perianal rash 08/06/2017   Atrial fibrillation (HCC) 07/08/2016   Recurrent epistaxis 07/08/2016   Deviated nasal septum 07/08/2016   Cecal angiodysplasia 05/28/2016   Iron deficiency anemia due to chronic blood loss 04/23/2016   Heme positive stool 04/23/2016   Long term (current) use of anticoagulants 04/23/2016   BARRETTS ESOPHAGUS 04/23/2009   History of colonic polyps 04/23/2009    Orientation RESPIRATION BLADDER Height & Weight     Self, Situation, Place  Normal Incontinent Weight: 98 kg Height:  5\' 8"  (172.7 cm)  BEHAVIORAL SYMPTOMS/MOOD NEUROLOGICAL BOWEL NUTRITION STATUS   (n/a)  (n/a) Incontinent Diet  AMBULATORY STATUS COMMUNICATION OF NEEDS Skin   Extensive Assist Verbally  (n/a)                       Personal Care Assistance Level of Assistance  Bathing, Feeding, Dressing Bathing Assistance: Maximum assistance Feeding assistance: Limited assistance Dressing Assistance: Maximum assistance     Functional Limitations Info  Sight, Hearing, Speech Sight Info: Adequate Hearing Info: Adequate Speech Info: Adequate    SPECIAL CARE FACTORS FREQUENCY  PT (By licensed PT), OT (By licensed OT)     PT Frequency: 5X/wk OT Frequency: 5X/wk            Contractures Contractures Info: Not present    Additional Factors Info  Code Status, Allergies, Psychotropic, Insulin Sliding Scale, Isolation Precautions, Suctioning Needs Code Status Info: Full Allergies Info: NKA Psychotropic Info: see d/c summary Insulin Sliding Scale Info: see d/c summary Isolation Precautions Info: n/a Suctioning Needs: n/a   Current Medications (08/05/2022):  This is the current hospital  active medication list Current Facility-Administered Medications  Medication Dose Route Frequency Provider Last Rate Last Admin   acetaminophen (TYLENOL) tablet 650 mg  650 mg Oral Q6H PRN [name], MD       Or   acetaminophen (TYLENOL)  suppository 650 mg  650 mg Rectal Q6H PRN [name], MD       Chlorhexidine Gluconate Cloth 2 % PADS 6 each  6 each Topical Daily [name], MD   6 each at 08/05/22 0932   cholecalciferol (VITAMIN D3) 25 MCG (1000 UNIT) tablet 5,000 Units  5,000 Units Oral QPM [name], MD   5,000 Units at 08/04/22 1734   insulin aspart (novoLOG) injection 0-5 Units  0-5 Units Subcutaneous QHS [name], MD       insulin aspart (novoLOG) injection 0-9 Units  0-9 Units Subcutaneous TID WC [name], MD   1 Units at 08/03/22 1232   levothyroxine (SYNTHROID) tablet 112 mcg  112 mcg Oral Q0600 [name], MD   112 mcg at 08/05/22 0507   pantoprazole (PROTONIX) injection 40 mg  40 mg Intravenous Q12H [name], MD   40 mg at 08/05/22 1022   piperacillin-tazobactam (ZOSYN) IVPB 3.375 g  3.375 g Intravenous Q8H Cindi Carbon, RPH 12.5 mL/hr at 08/05/22 0508 3.375 g at 08/05/22 0508   polyethylene glycol (MIRALAX / GLYCOLAX) packet 17 g  17 g Oral Daily PRN [name], MD       sodium chloride flush (NS) 0.9 % injection 3 mL  3 mL Intravenous Q12H [name], MD   3 mL at 08/05/22 1022     Discharge Medications: Please see discharge summary for a list of discharge medications.  Relevant Imaging Results:  Relevant Lab Results:   Additional Information ss# [id number]  Beckie Busing, RN

## 2022-08-05 NOTE — Progress Notes (Signed)
PROGRESS NOTE    Carl Woods  NWG:[id number] DOB: October 07, 1931 DOA: 08/01/2022 PCP: [name], MD    Brief Narrative:   Carl Woods is a 89 y.o. male with past medical history significant for paroxysmal atrial fibrillation, ampullary carcinoma, history of AVMs, sacral angiodysplasia, BPH, CKD stage IIIb, history of diverticulosis, hypothyroidism, OSA, obesity, BPH and recent hospitalization for GI bleed secondary to prepyloric ulcer s/p clipping who presented to [facility] ED on 7/5 from home for altered mental status.  Patient recently discharged from SNF rehab Monday in which she is currently living at home alone.  Family noted that he was becoming more confused, weak and drowsy, fatigue on Thursday.  Daughter also noted patient with black stools.  Patient apparently has been off of his Eliquis since previous hospitalization.  In the ED, patient was lethargic, confused; slightly arousable but falls back asleep.  Temperature 98.0 F, HR 93, RR 15, BP 130/49, SpO2 100% on room air.  WBC 5.6, hemoglobin 5.0, MCV 117.1, platelet count 137.  Sodium 143, potassium 4.1, chloride 114, CO2 21, glucose 135, BUN 32, creatinine 1.53.  High sensitive troponin 51 followed by 43.  Urinalysis unrevealing.  FOBT positive.  CT head without contrast with no acute intracranial abnormality.  GI was consulted.  2 unit PRBCs ordered.  TRH consulted for admission for further evaluation and management of recurrent GI bleed.  Assessment & Plan:   Acute metabolic encephalopathy Etiology likely secondary to significant anemia secondary to GI bleed versus infectious with cholangitis noted with purulent bile on EGD.  CT head without contrast with no acute findings. 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  Hemoglobin noted to be 5.0 with MCV of 117.1.  FOBT positive.  Anemia panel with iron 139, TIBC 228, ferritin 103, folate 10.9, vitamin B12 451.  GI was consulted and patient underwent EGD on 08/02/2022 with findings of gastric antral vascular ectasia with bleeding s/p APC, single nonbleeding angiectasia jejunum treated with APC. -- Hgb 5.0>7.0>9.3>10.2>9.6>9.4>8.3>8.3>8.0 -- s/p 2 u pRBC 7/5, 2 u pRBC 7/6 -- Continue Protonix drip x 72 hours then Protonix 40mg  IV q12h -- H&H every 12 hours -- CBC daily -- GI plans ERCP with bare-metal stent placement on 7/10; NPO after MN  Hx ampullary cancer s/p ERCP with plastic stent placement Cholangitis Patient with previous ERCP with plastic stent placement was planned to have repeat ERCP soon for metal stent placement.  On EGD 7/6; plastic stent no longer visualized and prolonged observation of the ampullary area revealed passage of purulent bile.  May need stent replacement with metal stent, timing per GI. -- Zosyn, plan 7-day course -- CBC daily  Hypokalemia Hypomagnesemia Potassium 4.5 today -- Repeat electrolytes in a.m.  Hypothyroidism -- Levothyroxine 112 mcg p.o. daily  Essential hypertension At baseline on metoprolol succinate 12.5 mg p.o. daily, Bumex 1 mg p.o. daily. -- Borderline hypotensive, hold antihypertensives for now -- Continue monitor BP closely  CKD stage IIIb Creatinine 1.55>1.58>1.68, stable. --BMP daily  BPH Not on medication outpatient. --Continue monitor urine output  Obesity Body mass index is 32.85 kg/m.  Discussed with patient needs for aggressive lifestyle changes/weight loss as this complicates all facets of care.  Outpatient follow-up with PCP.    Goals of care: Patient now with recurrent hospitalization, per GI;  Dr. Myrtie Neither may not have more to offer this patient unfortunately.  Overall prognosis poor.   -- Palliative care following, appreciate assistance   DVT  prophylaxis: SCDs Start: 08/01/22 2125    Code Status: Full Code Family Communication: No family present at bedside this morning, son updated at bedside yesterday afternoon  Disposition Plan:  Level of care: Telemetry Status is: Inpatient Remains inpatient appropriate because: IV antibiotics, ERCP pending for tomorrow    Consultants:  [facility] Palliative care  Procedures:  EGD  Antimicrobials:  Zosyn 7/6>>   Subjective: Patient seen examined at bedside, resting calmly.  Lying in bed.  No family present.  Eating breakfast.  No specific complaints this morning.  Hemoglobin, 8.0, stable this morning.  ERCP with bare-metal stent placement planned for tomorrow.  Patient denies headache, no dizziness, no chest pain, no shortness of breath, no abdominal pain.  No acute concerns this morning per nursing staff.   Objective: Vitals:   08/04/22 1406 08/04/22 1942 08/05/22 0506 08/05/22 1018  BP: (!) 111/54 125/67 (!) 119/57   Pulse: 70 66 84 86  Resp:  18 18   Temp: 98.1 F (36.7 C) 97.8 F (36.6 C) 97.9 F (36.6 C)   TempSrc: Oral Oral Oral   SpO2: 96% 100% 99%   Weight:      Height:        Intake/Output Summary (Last 24 hours) at 08/05/2022 1412 Last data filed at 08/05/2022 0800 Gross per 24 hour  Intake 1321.63 ml  Output 1700 ml  Net -378.37 ml   Filed Weights   08/01/22 1402 08/02/22 1052  Weight: 92 kg 98 kg    Examination:  Physical Exam: GEN: NAD, alert and oriented x 3, chronically ill in appearance HEENT: NCAT, PERRL, EOMI, sclera clear, dry mucous membranes PULM: CTAB w/o wheezes/crackles, normal respiratory effort, on room air CV: RRR w/o M/G/R GI: abd soft, NTND, NABS, no R/G/M MSK: 2+ pitting edema bilateral lower extremities up to mid shin, chronic venous changes/skin changes noted to bilateral lower extremities, moves all extremities independently NEURO: No focal neurological deficits appreciated Integumentary: No concerning  rashes/lesions/wounds noted on exposed skin surfaces.    Data Reviewed: I have personally reviewed following labs and imaging studies  CBC: Recent Labs  Lab 08/01/22 1538 08/02/22 0311 08/03/22 0252 08/03/22 1303 08/04/22 0455 08/04/22 1046 08/04/22 1949 08/05/22 0614  WBC 5.6  --  6.6  --  7.2  --   --  7.0  NEUTROABS 3.8  --   --   --   --   --   --   --   HGB 5.0*   < > 9.6* 9.4* 8.3* 8.3* 8.2* 8.0*  HCT 16.4*   < > 30.2* 29.1* 26.2* 26.6* 26.7* 25.7*  MCV 117.1*  --  96.2  --  97.8  --   --  99.6  PLT 137*  --  137*  --  129*  --   --  130*   < > = values in this interval not displayed.   Basic Metabolic Panel: Recent Labs  Lab 08/01/22 1538 08/02/22 0311 08/03/22 0252 08/04/22 0301 08/05/22 0614  NA 143 145 144 142 138  K 4.1 4.4 3.4* 4.5 4.1  CL 114* 114* 114* 117* 109  CO2 21* 20* 19* 20* 21*  GLUCOSE 135* 117* 124* 103* 97  BUN 32* 32* 32* 31* 33*  CREATININE 1.53* 1.55* 1.58* 1.69* 1.68*  CALCIUM 8.3* 8.3* 8.2* 7.9* 8.2*  MG  --   --  1.4* 2.1 2.0  PHOS  --   --  3.2  --   --    GFR: Estimated Creatinine Clearance: 33.8 mL/min (A) (by C-G formula based on SCr of 1.68 mg/dL (H)). Liver Function Tests: Recent Labs  Lab 08/01/22 1228 08/03/22 0252 08/04/22 0301 08/05/22 0614  AST 28 28 23 20   ALT 15 17 18 16   ALKPHOS 68 60 56 49  BILITOT 0.7 1.0 0.9 0.8  PROT 5.4* 5.3* 5.4* 5.2*  ALBUMIN 3.0* 2.5* 2.3* 2.2*   No results for input(s): "LIPASE", "AMYLASE" in the last 168 hours. No results for input(s): "AMMONIA" in the last 168 hours. Coagulation Profile: Recent Labs  Lab 08/01/22 1538  INR 1.2   Cardiac Enzymes: No results for input(s): "CKTOTAL", "CKMB", "CKMBINDEX", "TROPONINI" in the last 168 hours. BNP (last 3 results) No results for input(s): "PROBNP" in the last 8760 hours. HbA1C: No results for input(s): "HGBA1C" in the last 72 hours.  CBG: Recent Labs  Lab 08/04/22 1244 08/04/22 1655 08/04/22 2046 08/05/22 0749  08/05/22 1129  GLUCAP 148* 118* 106* 87 124*   Lipid Profile: No results for input(s): "CHOL", "HDL", "LDLCALC", "TRIG", "CHOLHDL", "LDLDIRECT" in the last 72 hours. Thyroid Function Tests: No results for input(s): "TSH", "T4TOTAL", "FREET4", "T3FREE", "THYROIDAB" in the last 72 hours. Anemia Panel: Recent Labs    08/02/22 1416  VITAMINB12 451  FOLATE 10.9  FERRITIN 103  TIBC 288  IRON 139   Sepsis Labs: No results for input(s): "PROCALCITON", "LATICACIDVEN" in the last 168 hours.  Recent Results (from the past 240 hour(s))  MRSA Next Gen by PCR, Nasal     Status: None   Collection Time: 08/01/22 11:17 PM   Specimen: Nasal Mucosa; Nasal Swab  Result Value Ref Range Status   MRSA by PCR Next Gen NOT DETECTED NOT DETECTED Final    Comment: (NOTE) The GeneXpert MRSA Assay (FDA approved for NASAL specimens only), is one component of a comprehensive MRSA colonization surveillance program. It is not intended to diagnose MRSA infection nor to guide or monitor treatment for MRSA infections. Test performance is not FDA approved in patients less than 56 years old. Performed at [facility], [address] [place], [place] [address]          Radiology Studies: No results found.      Scheduled Meds:  Chlorhexidine Gluconate Cloth  6 each Topical Daily   cholecalciferol  5,000 Units Oral QPM   insulin aspart  0-5 Units Subcutaneous QHS   insulin aspart  0-9 Units Subcutaneous TID WC   levothyroxine  112 mcg Oral Q0600   pantoprazole  40 mg Intravenous Q12H   sodium chloride flush  3 mL Intravenous Q12H   Continuous Infusions:  piperacillin-tazobactam (ZOSYN)  IV 3.375 g (08/05/22 0508)     LOS: 4 days    Time spent: 52 minutes spent on chart review, discussion with nursing staff, consultants, updating family and interview/physical exam; more than 50% of that time was spent in counseling and/or coordination of care.    [name] [name],  DO [facility] Available via Epic secure chat 7am-7pm After these hours, please refer to coverage provider listed on [email or web address] 08/05/2022, 2:12 PM

## 2022-08-05 NOTE — TOC Initial Note (Signed)
Transition of Care [facility]) - Initial/Assessment Note    Patient Details  Name: Carl Woods MRN: [id number] Date of Birth: 1931/11/21  [facility]) CM/SW Contact:    Beckie Busing, RN Phone Number:[phone]  08/05/2022, 12:35 PM  Clinical Narrative:                 [facility] acknowledges consult for patient with SNF recommendations and patient with high risk for readmission. Patient is from home where he lives along. Patient reports that he lives alone and normally maintains independently at home. Patient states that he does have a PCP Nicholos Johns, Ajith, MD) and follows in a regular basis. Patient reports that he does have access to medications and that they are affordable. Patient states that his children provide transportation and check on him frequently.   Cm at bedside to explain recommendations for SNF. Patient verbalized understanding and states that he is agreeable to SNF with [facility] being the first option. Cm spoke with daughter [name] via phone. [name] confirms plan. CM has completed FL2 and faxed patients information out for bed offers.Patient is currently not medically ready for discharge. CM will continue to follow.   Expected Discharge Plan: Skilled Nursing Facility Barriers to Discharge: Continued Medical Work up   Patient Goals and CMS Choice Patient states their goals for this hospitalization and ongoing recovery are:: To go to short term rehab and then home CMS Medicare.gov Compare Post Acute Care list provided to:: Patient Choice offered to / list presented to : Patient [place] ownership interest in [facility].provided to:: Patient    Expected Discharge Plan and Services In-house Referral: NA Discharge Planning Services: CM Consult Post Acute Care Choice: Skilled Nursing Facility Living arrangements for the past 2 months: Single Family Home                 DME Arranged: N/A DME Agency: NA       HH Arranged: NA HH Agency: NA         Prior Living Arrangements/Services Living arrangements for the past 2 months: Single Family Home Lives with:: Self Patient language and need for interpreter reviewed:: Yes Do you feel safe going back to the place where you live?: Yes      Need for Family Participation in Patient Care: Yes (Comment) Care giver support system in place?: Yes (comment) Current home services: DME (CPAP) Criminal Activity/Legal Involvement Pertinent to Current Situation/Hospitalization: No - Comment as needed  Activities of Daily Living      Permission Sought/Granted Permission sought to share information with : Case Manager, Family Supports Permission granted to share information with : Yes, Verbal Permission Granted  Share Information with NAME: Garrette Caine [phone]     Permission granted to share info w Relationship: daughter  Permission granted to share info w Contact Information: [phone]  Emotional Assessment   Attitude/Demeanor/Rapport: Engaged Affect (typically observed): Appropriate Orientation: : Oriented to Self, Oriented to Place, Oriented to Situation Alcohol / Substance Use: Not Applicable Psych Involvement: No (comment)  Admission diagnosis:  GI bleeding [K92.2] Encephalopathy acute [G93.40] Symptomatic anemia [D64.9] Gastrointestinal hemorrhage, unspecified gastrointestinal hemorrhage type [K92.2] Patient Active Problem List   Diagnosis Date Noted   Need for emotional support 08/03/2022   Melena 08/02/2022   AVM (arteriovenous malformation) of small bowel, acquired with hemorrhage 08/02/2022   GAVE (gastric antral vascular ectasia) 08/02/2022   DNR (do not resuscitate) 08/02/2022   Goals of care, counseling/discussion 08/02/2022   Counseling and coordination of care  08/02/2022   Palliative care encounter 08/02/2022   Acute encephalopathy 08/01/2022   GI bleeding 08/01/2022   Gastritis and gastroduodenitis 07/09/2022   Gastric ulcer without hemorrhage or perforation  07/09/2022   Acute blood loss anemia 07/09/2022   Acute heart failure with preserved ejection fraction (HFpEF) (HCC) 07/07/2022   Preprocedural cardiovascular examination 07/07/2022   Symptomatic anemia 07/06/2022   Non-ST elevation (NSTEMI) myocardial infarction (HCC) 07/06/2022   Elevated brain natriuretic peptide (BNP) level 07/06/2022   Pleural effusion 07/06/2022   Hypokalemia 07/06/2022   Hypothyroidism 07/06/2022   GERD (gastroesophageal reflux disease) 07/06/2022   Shortness of breath 07/06/2022   Hypervolemia 07/06/2022   AKI (acute kidney injury) (HCC) 07/06/2022   Vitamin B12 deficiency anemia 06/16/2022   Cancer of ampulla of Vater (HCC) 01/31/2022   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 [place] [address] Phone: [phone] Fax: [phone]     Social Determinants of Health (SDOH) Social History: SDOH Screenings   Food Insecurity: No Food Insecurity (07/08/2022)  Housing: Patient Declined (07/08/2022)  Transportation Needs: Patient Declined (07/08/2022)  Utilities: Patient Declined (07/08/2022)  Depression (PHQ2-9): Low Risk  (02/18/2022)  Tobacco Use: Medium Risk (08/04/2022)   SDOH Interventions:     Readmission Risk Interventions    08/04/2022    3:58 PM 07/11/2022   12:01 PM 07/07/2022    1:52 PM  Readmission Risk Prevention Plan  Transportation Screening Complete  Complete  PCP or Specialist Appt within 3-5 Days Complete  Complete  HRI or Home Care  Consult Complete  Complete  Social Work Consult for Recovery Care Planning/Counseling Complete  Complete  Palliative Care Screening Complete Complete Not Applicable  Medication Review Oceanographer) Complete  Complete

## 2022-08-05 NOTE — H&P (View-Only) (Signed)
    Progress Note   Subjective  Chief Complaint: GI bleeding with anemia, known ampullary cancer  Patient found doing well this morning, eating his breakfast.  No acute complaints or concerns.   Objective   Vital signs in last 24 hours: Temp:  [97.8 F (36.6 C)-98.1 F (36.7 C)] 97.9 F (36.6 C) (07/09 0506) Pulse Rate:  [66-84] 84 (07/09 0506) Resp:  [18] 18 (07/09 0506) BP: (111-125)/(54-67) 119/57 (07/09 0506) SpO2:  [96 %-100 %] 99 % (07/09 0506) Last BM Date : 08/04/22 General:    Elderly, chronically ill appearing male in NAD Heart:  Regular rate and rhythm; no murmurs Lungs: Respirations even and unlabored, lungs CTA bilaterally Abdomen:  Soft, nontender and nondistended. Normal bowel sounds. Psych:  Cooperative. Normal mood and affect.  Intake/Output from previous day: 07/08 0701 - 07/09 0700 In: 1764.6 [P.O.:1220; I.V.:303.2; IV Piggyback:241.4] Out: 1700 [Urine:1700]   Lab Results: Recent Labs    08/03/22 0252 08/03/22 1303 08/04/22 0455 08/04/22 1046 08/04/22 1949 08/05/22 0614  WBC 6.6  --  7.2  --   --  7.0  HGB 9.6*   < > 8.3* 8.3* 8.2* 8.0*  HCT 30.2*   < > 26.2* 26.6* 26.7* 25.7*  PLT 137*  --  129*  --   --  130*   < > = values in this interval not displayed.   BMET Recent Labs    08/03/22 0252 08/04/22 0301 08/05/22 0614  NA 144 142 138  K 3.4* 4.5 4.1  CL 114* 117* 109  CO2 19* 20* 21*  GLUCOSE 124* 103* 97  BUN 32* 31* 33*  CREATININE 1.58* 1.69* 1.68*  CALCIUM 8.2* 7.9* 8.2*   LFT Recent Labs    08/05/22 0614  PROT 5.2*  ALBUMIN 2.2*  AST 20  ALT 16  ALKPHOS 49  BILITOT 0.8    Assessment / Plan:   Assessment: 1.  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  Acute blood loss anemia 3.  Altered mental status: Improved now posttransfusion antibiotic  treatment for suspected low-grade cholangitis, bile seen flowing from the ampullary orifice during EGD in 08/02/2022 4.  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  Again hemoglobin remained stable 2.  Plans for repeat ERCP tomorrow 08/06/2022 with metal biliary stent placement (there is a small possibility this may need to be bumped to 7/11, pending anesthesia availability) 3.  Continue regular diet today and n.p.o. at midnight 4.  Continue PPI 5.  Continue to monitor hemoglobin and transfusion as needed less than 8  Thank you for your kind consultation, we will continue to follow   LOS: 4 days   Unk Lightning  08/05/2022, 9:36 AM     Attending physician's note   I have taken history, reviewed the chart and examined the patient. I performed a substantive portion of this encounter, including complete performance of at least one of the key components, in conjunction with the APP. I agree with the Advanced Practitioner's note, impression and recommendations.   Recheck CBC in AM. If <8, transfuse 1U ERCP w/t stent in AM.  Continue antibiotics/Protonix Discussed risks and benefits including risks of pancreatitis, bleeding, perforation.  Benefits were also discussed.   Edman Circle, MD [facility] [phone]

## 2022-08-06 ENCOUNTER — Inpatient Hospital Stay (HOSPITAL_COMMUNITY): Payer: PPO

## 2022-08-06 ENCOUNTER — Encounter (HOSPITAL_COMMUNITY): Payer: Self-pay | Admitting: Internal Medicine

## 2022-08-06 ENCOUNTER — Encounter (HOSPITAL_COMMUNITY)
Admission: EM | Disposition: A | Discharge status: Skilled Nursing Facility | Payer: Self-pay | Source: Home / Self Care | Attending: Internal Medicine

## 2022-08-06 ENCOUNTER — Inpatient Hospital Stay (HOSPITAL_COMMUNITY): Payer: PPO | Admitting: Certified Registered Nurse Anesthetist

## 2022-08-06 DIAGNOSIS — K831 Obstruction of bile duct: Secondary | ICD-10-CM

## 2022-08-06 DIAGNOSIS — K921 Melena: Secondary | ICD-10-CM | POA: Diagnosis not present

## 2022-08-06 DIAGNOSIS — D62 Acute posthemorrhagic anemia: Secondary | ICD-10-CM | POA: Diagnosis not present

## 2022-08-06 DIAGNOSIS — G9340 Encephalopathy, unspecified: Secondary | ICD-10-CM | POA: Diagnosis not present

## 2022-08-06 DIAGNOSIS — Z87891 Personal history of nicotine dependence: Secondary | ICD-10-CM

## 2022-08-06 DIAGNOSIS — I252 Old myocardial infarction: Secondary | ICD-10-CM

## 2022-08-06 DIAGNOSIS — K5521 Angiodysplasia of colon with hemorrhage: Secondary | ICD-10-CM

## 2022-08-06 DIAGNOSIS — K922 Gastrointestinal hemorrhage, unspecified: Secondary | ICD-10-CM | POA: Diagnosis not present

## 2022-08-06 DIAGNOSIS — E039 Hypothyroidism, unspecified: Secondary | ICD-10-CM

## 2022-08-06 HISTORY — PX: REMOVAL OF STONES: SHX5545

## 2022-08-06 HISTORY — PX: ERCP: SHX5425

## 2022-08-06 HISTORY — PX: BILIARY STENT PLACEMENT: SHX5538

## 2022-08-06 LAB — GLUCOSE, CAPILLARY
Glucose-Capillary: 88 mg/dL (ref 70–99)
Glucose-Capillary: 86 mg/dL (ref 70–99)
Glucose-Capillary: 98 mg/dL (ref 70–99)
Glucose-Capillary: 145 mg/dL — ABNORMAL HIGH (ref 70–99)
Glucose-Capillary: 192 mg/dL — ABNORMAL HIGH (ref 70–99)

## 2022-08-06 LAB — CBC
HCT: 25 % — ABNORMAL LOW (ref 39.0–52.0)
Hemoglobin: 7.7 g/dL — ABNORMAL LOW (ref 13.0–17.0)
MCH: 31.2 pg (ref 26.0–34.0)
MCHC: 30.8 g/dL (ref 30.0–36.0)
MCV: 101.2 fL — ABNORMAL HIGH (ref 80.0–100.0)
Platelets: UNDETERMINED 10*3/uL (ref 150–400)
RBC: 2.47 MIL/uL — ABNORMAL LOW (ref 4.22–5.81)
RDW: 26.5 % — ABNORMAL HIGH (ref 11.5–15.5)
WBC: 5.6 10*3/uL (ref 4.0–10.5)
nRBC: 0.4 % — ABNORMAL HIGH (ref 0.0–0.2)

## 2022-08-06 LAB — COMPREHENSIVE METABOLIC PANEL
ALT: 15 U/L (ref 0–44)
AST: 26 U/L (ref 15–41)
Albumin: 2.1 g/dL — ABNORMAL LOW (ref 3.5–5.0)
Alkaline Phosphatase: 49 U/L (ref 38–126)
Anion gap: 7 (ref 5–15)
BUN: 32 mg/dL — ABNORMAL HIGH (ref 8–23)
CO2: 20 mmol/L — ABNORMAL LOW (ref 22–32)
Calcium: 8.3 mg/dL — ABNORMAL LOW (ref 8.9–10.3)
Chloride: 109 mmol/L (ref 98–111)
Creatinine, Ser: 1.49 mg/dL — ABNORMAL HIGH (ref 0.61–1.24)
GFR, Estimated: 45 mL/min — ABNORMAL LOW (ref >60)
Glucose, Bld: 87 mg/dL (ref 70–99)
Potassium: 4.3 mmol/L (ref 3.5–5.1)
Sodium: 136 mmol/L (ref 135–145)
Total Bilirubin: 0.6 mg/dL (ref 0.3–1.2)
Total Protein: 5 g/dL — ABNORMAL LOW (ref 6.5–8.1)

## 2022-08-06 LAB — CBC WITH DIFFERENTIAL/PLATELET
Abs Immature Granulocytes: 0.02 10*3/uL (ref 0.00–0.07)
Basophils Absolute: 0 10*3/uL (ref 0.0–0.1)
Basophils Relative: 0 %
Eosinophils Absolute: 0.2 10*3/uL (ref 0.0–0.5)
Eosinophils Relative: 2 %
HCT: 26.7 % — ABNORMAL LOW (ref 39.0–52.0)
Hemoglobin: 7.8 g/dL — ABNORMAL LOW (ref 13.0–17.0)
Immature Granulocytes: 0 %
Lymphocytes Relative: 7 %
Lymphs Abs: 0.6 10*3/uL — ABNORMAL LOW (ref 0.7–4.0)
MCH: 31.2 pg (ref 26.0–34.0)
MCHC: 29.2 g/dL — ABNORMAL LOW (ref 30.0–36.0)
MCV: 106.8 fL — ABNORMAL HIGH (ref 80.0–100.0)
Monocytes Absolute: 0.3 10*3/uL (ref 0.1–1.0)
Monocytes Relative: 4 %
Neutro Abs: 8 10*3/uL — ABNORMAL HIGH (ref 1.7–7.7)
Neutrophils Relative %: 87 %
Platelets: UNDETERMINED 10*3/uL (ref 150–400)
RBC: 2.5 MIL/uL — ABNORMAL LOW (ref 4.22–5.81)
RDW: 26.7 % — ABNORMAL HIGH (ref 11.5–15.5)
WBC: 9.2 10*3/uL (ref 4.0–10.5)
nRBC: 0.2 % (ref 0.0–0.2)

## 2022-08-06 LAB — TYPE AND SCREEN
ABO/RH(D): A NEG
Unit division: 0

## 2022-08-06 LAB — BPAM RBC: Blood Product Expiration Date: 202408132359

## 2022-08-06 LAB — PREPARE RBC (CROSSMATCH)

## 2022-08-06 SURGERY — ERCP, WITH INTERVENTION IF INDICATED
Anesthesia: General

## 2022-08-06 MED ORDER — PHENYLEPHRINE 80 MCG/ML (10ML) SYRINGE FOR IV PUSH (FOR BLOOD PRESSURE SUPPORT)
PREFILLED_SYRINGE | INTRAVENOUS | Status: DC | PRN
  Administered 2022-08-06 (×3): 80 ug via INTRAVENOUS

## 2022-08-06 MED ORDER — SODIUM CHLORIDE 0.9% IV SOLUTION: Freq: Once | INTRAVENOUS | Status: AC

## 2022-08-06 MED ORDER — GLUCAGON HCL RDNA (DIAGNOSTIC) 1 MG IJ SOLR
INTRAMUSCULAR | Status: AC
  Filled 2022-08-06: qty 2

## 2022-08-06 MED ORDER — LIDOCAINE 2% (20 MG/ML) 5 ML SYRINGE
INTRAMUSCULAR | Status: DC | PRN
  Administered 2022-08-06: 80 mg via INTRAVENOUS

## 2022-08-06 MED ORDER — GLUCAGON HCL RDNA (DIAGNOSTIC) 1 MG IJ SOLR
INTRAMUSCULAR | Status: DC | PRN
  Administered 2022-08-06: .25 mg via INTRAVENOUS

## 2022-08-06 MED ORDER — DEXAMETHASONE SODIUM PHOSPHATE 4 MG/ML IJ SOLN
INTRAMUSCULAR | Status: DC | PRN
  Administered 2022-08-06: 4 mg via INTRAVENOUS

## 2022-08-06 MED ORDER — SUCCINYLCHOLINE CHLORIDE 200 MG/10ML IV SOSY
PREFILLED_SYRINGE | INTRAVENOUS | Status: DC | PRN
  Administered 2022-08-06: 100 mg via INTRAVENOUS

## 2022-08-06 MED ORDER — DICLOFENAC SUPPOSITORY 100 MG
RECTAL | Status: DC | PRN
  Administered 2022-08-06: 100 mg via RECTAL

## 2022-08-06 MED ORDER — LACTATED RINGERS IV SOLN: INTRAVENOUS | Status: DC | PRN

## 2022-08-06 MED ORDER — CIPROFLOXACIN IN D5W 400 MG/200ML IV SOLN
INTRAVENOUS | Status: AC
  Filled 2022-08-06: qty 200

## 2022-08-06 MED ORDER — SODIUM CHLORIDE 0.9 % IV SOLN
INTRAVENOUS | Status: DC | PRN
  Administered 2022-08-06: 28 mL

## 2022-08-06 MED ORDER — DICLOFENAC SUPPOSITORY 100 MG
RECTAL | Status: AC
  Filled 2022-08-06: qty 1

## 2022-08-06 MED ORDER — FENTANYL CITRATE (PF) 100 MCG/2ML IJ SOLN
INTRAMUSCULAR | Status: AC
  Filled 2022-08-06: qty 2

## 2022-08-06 MED ORDER — ONDANSETRON HCL 4 MG/2ML IJ SOLN
INTRAMUSCULAR | Status: DC | PRN
  Administered 2022-08-06: 4 mg via INTRAVENOUS

## 2022-08-06 MED ORDER — FENTANYL CITRATE (PF) 100 MCG/2ML IJ SOLN
INTRAMUSCULAR | Status: DC | PRN
  Administered 2022-08-06: 50 ug via INTRAVENOUS

## 2022-08-06 MED ORDER — INDOMETHACIN 50 MG RE SUPP
RECTAL | Status: AC
  Filled 2022-08-06: qty 2

## 2022-08-06 MED ORDER — PROPOFOL 10 MG/ML IV BOLUS
INTRAVENOUS | Status: DC | PRN
  Administered 2022-08-06: 100 mg via INTRAVENOUS

## 2022-08-06 NOTE — Telephone Encounter (Signed)
No problems Will discuss with him tomorrow afternoon. Have ordered a unit of blood since his hemoglobin was down to 7.7 this morning Hopefully he will be more lucid tomorrow RG

## 2022-08-06 NOTE — Anesthesia Procedure Notes (Signed)
Procedure Name: Intubation Date/Time: 08/06/2022 12:50 PM  Performed by: Ludwig Lean, CRNAPre-anesthesia Checklist: Patient identified, Emergency Drugs available, Suction available and Patient being monitored Patient Re-evaluated:Patient Re-evaluated prior to induction Oxygen Delivery Method: Circle system utilized Preoxygenation: Pre-oxygenation with 100% oxygen Induction Type: IV induction Ventilation: Mask ventilation without difficulty Laryngoscope Size: Mac and 4 Grade View: Grade I Tube type: Oral Tube size: 7.5 mm Number of attempts: 1 Airway Equipment and Method: Stylet Placement Confirmation: ETT inserted through vocal cords under direct vision, positive ETCO2 and breath sounds checked- equal and bilateral Secured at: 22 cm Tube secured with: Tape Dental Injury: Teeth and Oropharynx as per pre-operative assessment

## 2022-08-06 NOTE — Progress Notes (Signed)
[facility]                                                                               Carl Woods, is a 89 y.o. male, DOB - Sep 18, 1931, ZOX:[id number] Admit date - 08/01/2022    Outpatient Primary MD for the patient is [name], MD  LOS - 5  days    Brief summary   89 y.o. male with past medical history significant for paroxysmal atrial fibrillation, ampullary carcinoma, history of AVMs, sacral angiodysplasia, BPH, CKD stage IIIb, history of diverticulosis, hypothyroidism, OSA, obesity, BPH and recent hospitalization for GI bleed secondary to prepyloric ulcer s/p clipping who presented to [facility] ED on 7/5 from home for altered mental status.  Patient recently discharged from [facility] rehab Monday . But he started getting more confused  and lethargic on Thursday.  CT head without contrast with no acute intracranial abnormality. GI was consulted. 2 unit PRBCs ordered. TRH consulted for admission for further evaluation and management of recurrent GI bleed.   Assessment & Plan    Assessment and Plan: * Acute  metabolic encephalopathy CT head without contrast with no acute findings.  Pt continues to be lethargic, and confused.  MRI brain without contrast ordered to evaluate for stroke.    Symptomatic anemia/ GI bleed:  Tarry stools.  Hemoglobin 5 on admission. FOBT positive.  Anemia panel unremarkable. 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 Continue with PPI BID.  Hemoglobin 5 on admission underwent 2 units of PRBC improved to 10.2, Slowly is trending down to 7.7 today. 1 more unit of prbc ordered for transfusion.    Hx ampullary cancer s/p ERCP with plastic stent placement Cholangitis Patient with previous ERCP with plastic stent placement was planned to have repeat ERCP soon for metal stent placement.  On EGD 7/6; plastic  stent no longer visualized and prolonged observation of the ampullary area revealed passage of purulent bile.  He is scheduled for ERCP and stent placement today.   Atrial fibrillation (HCC) Rate controlled.  Patient has not taken Eliquis since June.   Hypothyroidism:  Resume synthroid 112 mcg daily.   Hypokalemia and Hypomagnesemia Replaced.    Hypertension;  BP parameters have improved. Continue to monitor.    Stage 3b CKD Creatinine appears to be at baseline.     Body mass index is 32.85 kg/m. Obesity.   Goals of care.  Recurrent hospitalization,  Overall poor prognosis.  Palliative consulted.          Estimated body mass index is 32.85 kg/m as calculated from the following:   Height as of this encounter: 5\' 8"  (1.727 m).   Weight as of this encounter: 98 kg.  Code Status: full code.  DVT Prophylaxis:  SCDs Start: 08/01/22 2125   Level of Care: Level of care: Telemetry Family Communication: Updated patient's family at bedside.   Disposition Plan:     Remains inpatient appropriate:  ERCP today.  Procedures:  ERCP EDP  Consultants:   Gi Consult Palliative care Antimicrobials:   Anti-infectives (From admission, onward)  Start     Dose/Rate Route Frequency Ordered Stop   08/02/22 2200  [MAR Hold]  piperacillin-tazobactam (ZOSYN) IVPB 3.375 g        (MAR Hold since Wed 08/06/2022 at 1153.Hold Reason: Transfer to a Procedural area)   3.375 g 12.5 mL/hr over 240 Minutes Intravenous Every 8 hours 08/02/22 1240     08/02/22 1400  piperacillin-tazobactam (ZOSYN) IVPB 3.375 g        3.375 g 100 mL/hr over 30 Minutes Intravenous  Once 08/02/22 1238 08/02/22 1519        Medications  Scheduled Meds:  [MAR Hold] Chlorhexidine Gluconate Cloth  6 each Topical Daily   [MAR Hold] cholecalciferol  5,000 Units Oral QPM   [MAR Hold] insulin aspart  0-5 Units Subcutaneous QHS   [MAR Hold] insulin aspart  0-9 Units Subcutaneous TID WC   [MAR Hold]  levothyroxine  112 mcg Oral Q0600   [MAR Hold] pantoprazole  40 mg Intravenous Q12H   [MAR Hold] sodium chloride flush  3 mL Intravenous Q12H   Continuous Infusions:  [MAR Hold] piperacillin-tazobactam (ZOSYN)  IV 3.375 g (08/06/22 0558)   PRN Meds:.[MAR Hold] acetaminophen **OR** [MAR Hold] acetaminophen, [MAR Hold] polyethylene glycol    Subjective:   Carl Woods was seen and examined today.  Lethargic, confused.   Objective:   Vitals:   08/05/22 1505 08/05/22 1957 08/06/22 0452 08/06/22 1158  BP: (!) 98/48 (!) 122/50 (!) 116/45 (!) 113/47  Pulse: 60 88 (!) 56 75  Resp: 15 18 18  (!) 21  Temp: 97.7 F (36.5 C) 97.8 F (36.6 C) (!) 97.5 F (36.4 C)   TempSrc: Oral Oral Oral   SpO2: 98% 100% 96% 97%  Weight:      Height:        Intake/Output Summary (Last 24 hours) at 08/06/2022 1213 Last data filed at 08/06/2022 1157 Gross per 24 hour  Intake 480 ml  Output 2950 ml  Net -2470 ml   Filed Weights   08/01/22 1402 08/02/22 1052  Weight: 92 kg 98 kg     Exam General exam: elderly gentleman, ill appearing , not in distress.  Respiratory system: Clear to auscultation. Respiratory effort normal. Cardiovascular system: S1 & S2 heard, RRR. Gastrointestinal system: Abdomen is nondistended, soft and nontender.  Central nervous system: lethargic, slow speech.  Extremities: trace edema.  Skin: No rashes,  Psychiatry:  unable to assess.      Data Reviewed:  I have personally reviewed following labs and imaging studies   CBC Lab Results  Component Value Date   WBC 5.6 08/06/2022   RBC 2.47 (L) 08/06/2022   HGB 7.7 (L) 08/06/2022   HCT 25.0 (L) 08/06/2022   MCV 101.2 (H) 08/06/2022   MCH 31.2 08/06/2022   PLT PLATELET CLUMPS NOTED ON SMEAR, UNABLE TO ESTIMATE 08/06/2022   MCHC 30.8 08/06/2022   RDW 26.5 (H) 08/06/2022   LYMPHSABS 1.1 08/01/2022   MONOABS 0.6 08/01/2022   EOSABS 0.1 08/01/2022   BASOSABS 0.0 08/01/2022     Last metabolic panel Lab Results   Component Value Date   NA 136 08/06/2022   K 4.3 08/06/2022   CL 109 08/06/2022   CO2 20 (L) 08/06/2022   BUN 32 (H) 08/06/2022   CREATININE 1.49 (H) 08/06/2022   GLUCOSE 87 08/06/2022   GFRNONAA 45 (L) 08/06/2022   GFRAA 55 (L) 02/23/2018   CALCIUM 8.3 (L) 08/06/2022   PHOS 3.2 08/03/2022   PROT 5.0 (L) 08/06/2022   ALBUMIN  2.1 (L) 08/06/2022   BILITOT 0.6 08/06/2022   ALKPHOS 49 08/06/2022   AST 26 08/06/2022   ALT 15 08/06/2022   ANIONGAP 7 08/06/2022    CBG (last 3)  Recent Labs    08/06/22 0800 08/06/22 1135 08/06/22 1212  GLUCAP 88 98 86      Coagulation Profile: Recent Labs  Lab 08/01/22 1538  INR 1.2     Radiology Studies: No results found.     Kathlen Mody M.D. [facility] 08/06/2022, 12:13 PM  Available via Epic secure chat 7am-7pm After 7 pm, please refer to night coverage provider listed on amion.

## 2022-08-06 NOTE — TOC Progression Note (Addendum)
Transition of Care [facility]) - Progression Note    Patient Details  Name: Carl Woods MRN: [id number] Date of Birth: 1931/03/15  [facility] CM/SW Contact  Beckie Busing, RN Phone Number:[phone]  08/06/2022, 11:39 AM  Clinical Narrative:    Patient has a bed offer from [facility] which is his first choice. CM at bedside to update patient. CM confirmed bed offer with admissions director at [facility]. CM called [employer] to initiate insurance auth. There is no answer [phone]) message has been left requesting return call to initiate auth. 1305 Missed call from [employer] [facility]. CM has attempted to return call but there is no answer, message has ben left.   1400 Insurance auth initiated with [employer].  1551 Will need updated therapy notes for insurance auth. CM to reach out to therapy.     Expected Discharge Plan: Skilled Nursing Facility Barriers to Discharge: Continued Medical Work up  Expected Discharge Plan and Services In-house Referral: NA Discharge Planning Services: CM Consult Post Acute Care Choice: Skilled Nursing Facility Living arrangements for the past 2 months: Single Family Home                 DME Arranged: N/A DME Agency: NA       HH Arranged: NA HH Agency: NA         Social Determinants of Health (SDOH) Interventions SDOH Screenings   Food Insecurity: No Food Insecurity (07/08/2022)  Housing: Patient Declined (07/08/2022)  Transportation Needs: Patient Declined (07/08/2022)  Utilities: Patient Declined (07/08/2022)  Depression (PHQ2-9): Low Risk  (02/18/2022)  Tobacco Use: Medium Risk (08/04/2022)    Readmission Risk Interventions    08/04/2022    3:58 PM 07/11/2022   12:01 PM 07/07/2022    1:52 PM  Readmission Risk Prevention Plan  Transportation Screening Complete  Complete  PCP or Specialist Appt within 3-5 Days Complete  Complete  HRI or Home Care Consult Complete  Complete  Social Work Consult for Recovery Care Planning/Counseling  Complete  Complete  Palliative Care Screening Complete Complete Not Applicable  Medication Review Oceanographer) Complete  Complete

## 2022-08-06 NOTE — Transfer of Care (Signed)
Immediate Anesthesia Transfer of Care Note  Patient: Carl Woods  Procedure(s) Performed: ENDOSCOPIC RETROGRADE CHOLANGIOPANCREATOGRAPHY (ERCP)  Patient Location: Endoscopy Unit  Anesthesia Type:General  Level of Consciousness: drowsy  Airway & Oxygen Therapy: Patient Spontanous Breathing and Patient connected to face mask oxygen  Post-op Assessment: Report given to RN and Post -op Vital signs reviewed and stable  Post vital signs: Reviewed and stable  Last Vitals:  Vitals Value Taken Time  BP    Temp    Pulse    Resp    SpO2      Last Pain:  Vitals:   08/06/22 1158  TempSrc:   PainSc: 0-No pain         Complications: No notable events documented.

## 2022-08-06 NOTE — Op Note (Signed)
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 H/O recent ascending                            cholangitis Providers:                Lynann Bologna, MD, Fransisca Connors, [name],                            RN, Melany Guernsey, Technician Referring MD:              Medicines:                General Anesthesia Complications:            No immediate complications. Estimated Blood Loss:     Estimated blood loss: none. Procedure:                Pre-Anesthesia Assessment:                           - Prior to the procedure, a History and Physical                            was performed, and patient medications and                            allergies were reviewed. The patient's tolerance of                            previous anesthesia was also reviewed. The risks                            and benefits of the procedure and the sedation                            options and risks were discussed with the patient.                            All questions were answered, and informed consent                            was obtained. Prior Anticoagulants: The patient has                            taken no anticoagulant or antiplatelet agents. ASA                            Grade Assessment: IV -  A patient with severe                            systemic disease that is a constant threat to life.                            After reviewing the risks and benefits, the patient                            was deemed in satisfactory condition to undergo the                            procedure.                           After obtaining informed consent, the scope was                             passed under direct vision. Throughout the                            procedure, the patient's blood pressure, pulse, and                            oxygen saturations were monitored continuously. The                            [id number]-Q190V ([id number]) Olympus duodenoscope was                            introduced through the mouth, and used to inject                            contrast into and used to inject contrast into the                            bile duct. The ERCP was accomplished without                            difficulty. The patient tolerated the procedure                            well. Scope In: Scope Out: Findings:      The scout film was normal. The esophagus was successfully intubated       under direct vision. The scope was advanced to a normal major papilla in       the descending duodenum without detailed examination of the pharynx,       larynx and associated structures, and upper GI tract.      Evidence of XRT GAVE like mucosa in the antrum and duodenum without any       active bleeding. Prior sphincterotomy. No obvious periampullary masses.       The bile duct was deeply cannulated with the short-nosed traction       sphincterotome using a wire-guided approach. The entire biliary  ductal       system was moderately dilated, secondary to a distal most stricture       (previously determined to be malignant). The largest diameter was 12 mm.       The biliary tree was swept with a 12 mm balloon starting at the       bifurcation. Sludge was swept from the duct. No stones or pus. One 10 Fr       by 6 cm uncovered metal stent was placed into the common bile duct. The       stent was in good position.      Pancreatic duct was intentionally not cannulated or injected. Diclofenac       suppositories were given prior to insertion of scope. At the time of       insertion of suppositories, melanotic stool was encountered. 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                           - GAVE like XRT gastroduodenopathy. Moderate Sedation:      Not Applicable - Patient had care per Anesthesia. Recommendation:           - Return patient to hospital ward for ongoing care.                           - Full liquid diet. Advance as tolerated                           - Continue antibiotics x 7 days (total)                           - Avoid OTC nonsteroidals                           - Since patient's hemoglobin had dropped to 7.7                            this morning, will transfuse 1 unit of PRBC. Trend                            CBC. Expect melanotic stools to continue for 1-2                            days (old blood).                           - Continue Protonix 40 twice daily as per last EGD                            note. Including recommendations as per recent EGD                            note.                           -  Watch for pancreatitis, bleeding, perforation,                            and cholangitis.                           - The findings and recommendations were discussed                            with the patient's daughter. Procedure Code(s):        --- Professional ---                           [id number], Endoscopic retrograde                            cholangiopancreatography (ERCP); with placement of                            endoscopic stent into biliary or pancreatic duct,                            including pre- and post-dilation and guide wire                            passage, when performed, including sphincterotomy,                            when performed, each stent                           [id number], Endoscopic retrograde                            cholangiopancreatography (ERCP); with removal of                            calculi/debris from biliary/pancreatic duct(s)                           60454, Endoscopic  catheterization of the biliary                            ductal system, radiological supervision and                            interpretation Diagnosis Code(s):        --- Professional ---                           K83.1, Obstruction of bile duct CPT copyright 2022 American Medical Association. All rights reserved. The codes documented in this report are preliminary and upon coder review may  be revised to meet current compliance requirements. Lynann Bologna, MD 08/06/2022 2:01:46 PM This report has been signed electronically. Number of Addenda: 0

## 2022-08-06 NOTE — Interval H&P Note (Signed)
History and Physical Interval Note:  08/06/2022 12:38 PM  Carl Woods  has presented today for surgery, with the diagnosis of Biliary stricture.  The various methods of treatment have been discussed with the patient and family. After consideration of risks, benefits and other options for treatment, the patient has consented to  Procedure(s): ENDOSCOPIC RETROGRADE CHOLANGIOPANCREATOGRAPHY (ERCP) (N/A) as a surgical intervention.  The patient's history has been reviewed, patient examined, no change in status, stable for surgery.  I have reviewed the patient's chart and labs.  Questions were answered to the patient's satisfaction.     Lynann Bologna

## 2022-08-06 NOTE — Anesthesia Preprocedure Evaluation (Addendum)
Anesthesia Evaluation  Patient identified by MRN, date of birth, ID band Patient awake    Reviewed: Allergy & Precautions, H&P , NPO status , Patient's Chart, lab work & pertinent test results  Airway Mallampati: II   Neck ROM: full    Dental   Pulmonary sleep apnea , former smoker   breath sounds clear to auscultation       Cardiovascular + dysrhythmias Atrial Fibrillation  Rhythm:regular Rate:Normal     Neuro/Psych    GI/Hepatic PUD,GERD  ,,GI bleeding   Endo/Other  Hypothyroidism    Renal/GU Renal InsufficiencyRenal disease     Musculoskeletal  (+) Arthritis ,    Abdominal   Peds  Hematology  (+) Blood dyscrasia, anemia Hemoglobin 7.7   Anesthesia Other Findings   Reproductive/Obstetrics                             Anesthesia Physical Anesthesia Plan  ASA: 4  Anesthesia Plan: General   Post-op Pain Management:    Induction: Intravenous  PONV Risk Score and Plan: 2 and Ondansetron, Dexamethasone and Treatment may vary due to age or medical condition  Airway Management Planned: Oral ETT  Additional Equipment:   Intra-op Plan:   Post-operative Plan: Extubation in OR  Informed Consent: I have reviewed the patients History and Physical, chart, labs and discussed the procedure including the risks, benefits and alternatives for the proposed anesthesia with the patient or authorized representative who has indicated his/her understanding and acceptance.     Dental advisory given  Plan Discussed with: CRNA, Anesthesiologist and Surgeon  Anesthesia Plan Comments:        Anesthesia Quick Evaluation

## 2022-08-06 NOTE — Care Management Important Message (Signed)
Important Message  Patient Details IM Letter given. Name: Carl Woods MRN: [id number] Date of Birth: [birth year]   Medicare Important Message Given:  Yes     Caren Macadam 08/06/2022, 12:11 PM

## 2022-08-07 ENCOUNTER — Ambulatory Visit: Payer: PPO | Admitting: Hematology

## 2022-08-07 ENCOUNTER — Encounter (HOSPITAL_COMMUNITY): Payer: Self-pay | Admitting: Gastroenterology

## 2022-08-07 ENCOUNTER — Other Ambulatory Visit: Payer: PPO

## 2022-08-07 DIAGNOSIS — K921 Melena: Secondary | ICD-10-CM | POA: Diagnosis not present

## 2022-08-07 DIAGNOSIS — K5520 Angiodysplasia of colon without hemorrhage: Secondary | ICD-10-CM | POA: Diagnosis not present

## 2022-08-07 DIAGNOSIS — D62 Acute posthemorrhagic anemia: Secondary | ICD-10-CM | POA: Diagnosis not present

## 2022-08-07 DIAGNOSIS — K922 Gastrointestinal hemorrhage, unspecified: Secondary | ICD-10-CM | POA: Diagnosis not present

## 2022-08-07 DIAGNOSIS — R4182 Altered mental status, unspecified: Secondary | ICD-10-CM | POA: Diagnosis not present

## 2022-08-07 DIAGNOSIS — G9340 Encephalopathy, unspecified: Secondary | ICD-10-CM | POA: Diagnosis not present

## 2022-08-07 LAB — CBC
HCT: 27.3 % — ABNORMAL LOW (ref 39.0–52.0)
Hemoglobin: 8.6 g/dL — ABNORMAL LOW (ref 13.0–17.0)
MCH: 30.3 pg (ref 26.0–34.0)
MCHC: 31.5 g/dL (ref 30.0–36.0)
MCV: 96.1 fL (ref 80.0–100.0)
Platelets: 136 10*3/uL — ABNORMAL LOW (ref 150–400)
RBC: 2.84 MIL/uL — ABNORMAL LOW (ref 4.22–5.81)
RDW: 24.8 % — ABNORMAL HIGH (ref 11.5–15.5)
WBC: 6.4 10*3/uL (ref 4.0–10.5)
nRBC: 0.6 % — ABNORMAL HIGH (ref 0.0–0.2)

## 2022-08-07 LAB — COMPREHENSIVE METABOLIC PANEL
ALT: 18 U/L (ref 0–44)
AST: 30 U/L (ref 15–41)
Albumin: 2.2 g/dL — ABNORMAL LOW (ref 3.5–5.0)
Alkaline Phosphatase: 56 U/L (ref 38–126)
Anion gap: 6 (ref 5–15)
BUN: 31 mg/dL — ABNORMAL HIGH (ref 8–23)
CO2: 21 mmol/L — ABNORMAL LOW (ref 22–32)
Calcium: 8.3 mg/dL — ABNORMAL LOW (ref 8.9–10.3)
Chloride: 108 mmol/L (ref 98–111)
Creatinine, Ser: 1.41 mg/dL — ABNORMAL HIGH (ref 0.61–1.24)
GFR, Estimated: 48 mL/min — ABNORMAL LOW (ref >60)
Glucose, Bld: 97 mg/dL (ref 70–99)
Potassium: 4.5 mmol/L (ref 3.5–5.1)
Sodium: 135 mmol/L (ref 135–145)
Total Bilirubin: 0.6 mg/dL (ref 0.3–1.2)
Total Protein: 5.4 g/dL — ABNORMAL LOW (ref 6.5–8.1)

## 2022-08-07 LAB — TYPE AND SCREEN: Antibody Screen: NEGATIVE

## 2022-08-07 LAB — GLUCOSE, CAPILLARY
Glucose-Capillary: 92 mg/dL (ref 70–99)
Glucose-Capillary: 110 mg/dL — ABNORMAL HIGH (ref 70–99)

## 2022-08-07 LAB — BPAM RBC
ISSUE DATE / TIME: 202407101731
Unit Type and Rh: 600

## 2022-08-07 NOTE — Progress Notes (Signed)
Occupational Therapy Treatment Patient Details Name: Carl Woods MRN: [id number] DOB: Dec 18, 1931 Today's Date: 08/07/2022   History of present illness Pt is a 89 year old male admitted 08/01/22 for acute metabolic encephalopathy, acute blood loss anemia, melena. Past medical history significant for paroxysmal atrial fibrillation, ampullary carcinoma, history of AVMs, sacral angiodysplasia, BPH, CKD stage IIIb, history of diverticulosis, hypothyroidism, OSA, obesity, BPH and recent hospitalization for GI bleed secondary to prepyloric ulcer s/p clipping. Patient recently discharged from [facility] rehab Monday in which he is currently living at home alone. ERCP scheduled for 7/10.   OT comments  Patient progressing and showed improved hand dexterity, mentation, visual attention to tasks, bed mobility and ambulating with less assistance, compared to previous session. Patient remains limited by slow processing and resolving cognitive deficits, generalized weakness and decreased activity tolerance along with deficits noted below. Pt continues to demonstrate good rehab potential and would benefit from continued skilled OT to increase safety and independence with ADLs and functional transfers to allow pt to return home safely and reduce caregiver burden and fall risk.    Recommendations for follow up therapy are one component of a multi-disciplinary discharge planning process, led by the attending physician.  Recommendations may be updated based on patient status, additional functional criteria and insurance authorization.    Assistance Recommended at Discharge Frequent or constant Supervision/Assistance  Patient can return home with the following  A little help with walking and/or transfers;A little help with bathing/dressing/bathroom;Assistance with cooking/housework;Assist for transportation;Help with stairs or ramp for entrance;Two people to help with walking and/or transfers   Equipment Recommendations        Recommendations for Other Services      Precautions / Restrictions Precautions Precautions: Fall Restrictions Weight Bearing Restrictions: No       Mobility Bed Mobility           Sit to supine: Min guard (Cues for sequencing. One failed attempt, then pt follwoed cues and able to bring LEs onto bed while controlling trunk with use of bed rail.)        Transfers                         Balance Overall balance assessment: Needs assistance, History of Falls Sitting-balance support: Feet supported, No upper extremity supported Sitting balance-Leahy Scale: Fair     Standing balance support: Bilateral upper extremity supported, During functional activity, Reliant on assistive device for balance Standing balance-Leahy Scale: Poor                             ADL either performed or assessed with clinical judgement   ADL Overall ADL's : Needs assistance/impaired Eating/Feeding: Set up;Sitting Eating/Feeding Details (indicate cue type and reason): Pt reports increased ease with self feeding. Declined adaptive handles for utensils.   Grooming Details (indicate cue type and reason): Pt declined grooming. Pt did demonstrate improved FMC to hands by opening a bottle of shampoo (screw top) while in recliner.         Upper Body Dressing : Minimal assistance;Sitting Upper Body Dressing Details (indicate cue type and reason): To don posterior gown.     Toilet Transfer: Minimal assistance;Rolling walker (2 wheels);Ambulation Toilet Transfer Details (indicate cue type and reason): Pt declined bathroom needs but agreeable to second walk today. Pt stood from Recliner to RW with Min assist.  Pt ambulated into hallway with Min Assist to Min guard, completing ~  83' with RW.   Toileting - Clothing Manipulation Details (indicate cue type and reason): Pt on external catheter and denied bowel void needs.     Functional mobility during ADLs: Minimal assistance;Rolling  walker (2 wheels)      Extremity/Trunk Assessment Upper Extremity Assessment RUE Deficits / Details: ROM: Shoulder limitations for IR which are insufficient for hygiene. Shoulder and grip weakness with elbow 5/5. RUE Coordination:  (Improved finger to thumb opposition with decreased rate.) LUE Deficits / Details: ROM: Shoulder limitations for IR which are insufficient for hygiene. Shoulder and grip weakness with elbow 5/5. LUE Coordination:  (Improved finger to thumb opposition with decreased rate.)   Lower Extremity Assessment Lower Extremity Assessment: Generalized weakness   Cervical / Trunk Assessment Cervical / Trunk Assessment: Kyphotic    Vision Baseline Vision/History: 1 Wears glasses Additional Comments: Pt showing improved visual attention to people and tasks. Pt did not require cues for visual attention to ambulation.   Perception     Praxis      Cognition Arousal/Alertness: Awake/alert Behavior During Therapy: WFL for tasks assessed/performed Overall Cognitive Status: Within Functional Limits for tasks assessed                               Problem Solving: Slow processing          Exercises      Shoulder Instructions       General Comments      Pertinent Vitals/ Pain       Pain Assessment Pain Assessment: No/denies pain  Home Living                                          Prior Functioning/Environment              Frequency  Min 1X/week        Progress Toward Goals  OT Goals(current goals can now be found in the care plan section)  Progress towards OT goals: Progressing toward goals  Acute Rehab OT Goals Patient Stated Goal: Improve balance, strength and walking OT Goal Formulation: With patient Time For Goal Achievement: 08/19/22 Potential to Achieve Goals: Good  Plan Discharge plan remains appropriate    Co-evaluation                 AM-PAC OT "6 Clicks" Daily Activity     Outcome  Measure   Help from another person eating meals?: A Little Help from another person taking care of personal grooming?: A Little Help from another person toileting, which includes using toliet, bedpan, or urinal?: A Lot Help from another person bathing (including washing, rinsing, drying)?: A Lot Help from another person to put on and taking off regular upper body clothing?: A Little Help from another person to put on and taking off regular lower body clothing?: A Lot 6 Click Score: 15    End of Session Equipment Utilized During Treatment: Gait belt;Rolling walker (2 wheels)  OT Visit Diagnosis: Unsteadiness on feet (R26.81);Other abnormalities of gait and mobility (R26.89);Muscle weakness (generalized) (M62.81);History of falling (Z91.81)   Activity Tolerance Patient tolerated treatment well   Patient Left in bed;with call bell/phone within reach;with bed alarm set   Nurse Communication Mobility status        Time: 1610-9604 OT Time Calculation (min): 24 min  Charges: OT General Charges $OT  Visit: 1 Visit OT Treatments $Therapeutic Activity: 23-37 mins  Victorino Dike, OT Acute Rehab Services Office: [phone] 08/07/2022  Theodoro Clock 08/07/2022, 3:22 PM

## 2022-08-07 NOTE — Plan of Care (Signed)
Pt resting quietly most of shift, MRI completed tonight, awaiting results.  Pt with one unit blood completed.  Pt with external catheter with good output documented.  Pt compliant with Q2 turns, able to help roll for pillow placement. Pt with good oral intake, taking sips when offered.

## 2022-08-07 NOTE — Progress Notes (Addendum)
Gastroenterology Progress Note  CC:  GI bleeding with anemia, known ampullary cancer  Subjective:  Feels ok.  Did have a BM in the middle of the night that was dark, but then nurse said he had another that was lighter in color.  Objective:  Vital signs in last 24 hours: Temp:  [97.4 F (36.3 C)-97.9 F (36.6 C)] 97.5 F (36.4 C) (07/11 0503) Pulse Rate:  [53-75] 56 (07/11 0503) Resp:  [14-21] 14 (07/11 0503) BP: (102-132)/(46-60) 110/56 (07/11 0503) SpO2:  [94 %-100 %] 97 % (07/11 0503) Last BM Date : 08/04/22 General:  Alert, elderly, in NAD Heart:  Regular rate and rhythm; no murmurs Pulm:  CTAB.  No W/R/R. Abdomen:  Soft, non-distended.  BS present.  Non-tender. Extremities:  Without edema. Neurologic:  Alert and oriented x 4;  grossly normal neurologically. Psych:  Alert and cooperative. Normal mood and affect.  Intake/Output from previous day: 07/10 0701 - 07/11 0700 In: 1208 [P.O.:140; I.V.:650; Blood:418] Out: 2400 [Urine:2400]  Lab Results: Recent Labs    08/06/22 0605 08/06/22 1527 08/07/22 0554  WBC 5.6 9.2 6.4  HGB 7.7* 7.8* 8.6*  HCT 25.0* 26.7* 27.3*  PLT PLATELET CLUMPS NOTED ON SMEAR, UNABLE TO ESTIMATE PLATELET CLUMPS NOTED ON SMEAR, UNABLE TO ESTIMATE 136*   BMET Recent Labs    08/05/22 0614 08/06/22 0605 08/07/22 0554  NA 138 136 135  K 4.1 4.3 4.5  CL 109 109 108  CO2 21* 20* 21*  GLUCOSE 97 87 97  BUN 33* 32* 31*  CREATININE 1.68* 1.49* 1.41*  CALCIUM 8.2* 8.3* 8.3*   LFT Recent Labs    08/07/22 0554  PROT 5.4*  ALBUMIN 2.2*  AST 30  ALT 18  ALKPHOS 56  BILITOT 0.6   MR BRAIN WO CONTRAST  Result Date: 08/07/2022 CLINICAL DATA:  Mental status change EXAM: MRI HEAD WITHOUT CONTRAST TECHNIQUE: Multiplanar, multiecho pulse sequences of the brain and surrounding structures were obtained without intravenous contrast. COMPARISON:  No prior MRI available, correlation is made with CT head 08/01/2022 FINDINGS: Brain: No  restricted diffusion to suggest acute or subacute infarct. No acute hemorrhage, mass, mass effect, or midline shift. No hydrocephalus or extra-axial collection. Normal pituitary and craniocervical junction. No hemosiderin deposition to suggest remote hemorrhage. Normal cerebral volume for age. Vascular: Normal arterial flow voids. Skull and upper cervical spine: Normal marrow signal. Sinuses/Orbits: Mucosal thickening in the ethmoid air cells. Status post bilateral lens replacements. Other: Fluid in the mastoid air cells. IMPRESSION: No acute intracranial process. No evidence of acute or subacute infarct. Electronically Signed   By: Wiliam Ke M.D.   On: 08/07/2022 02:09   DG ERCP  Result Date: 08/06/2022 CLINICAL DATA:  [id number] Surgery, elective [id number] EXAM: ERCP COMPARISON:  CT AP, 07/07/2022. FLUOROSCOPY: Exposure Index (as provided by the fluoroscopic device): 20.5 mGy Kerma FINDINGS: Limited oblique planar images of the RIGHT upper quadrant obtained C-arm. Images demonstrating flexible endoscopy, biliary duct cannulation, sphincterotomy, retrograde cholangiogram and balloon sweep. A metallic biliary stent is placed at the end of the case. IMPRESSION: Fluoroscopic imaging for ERCP and metallic biliary stent placement. For complete description of intra procedural findings, please see performing service dictation. Electronically Signed   By: Roanna Banning M.D.   On: 08/06/2022 17:08    Assessment / Plan: 1.  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  Hgb 8.6 grams this AM. 2.  Ampullary cancer:  S/p ERCP with uncovered 10Fr x 6 cm metal stent insertion on 7/10 3.  Altered mental status: Improved now posttransfusion antibiotic treatment for suspected low-grade cholangitis 4.  Small bowel  AVM: Treated with APC on 08/02/2022  -Continue pantoprazole 40 mg BID. -Continue abx (currently on Zosyn) for 7 days total. -Trend labs, transfuse further prn.   LOS: 6 days   Princella Pellegrini. Zehr  08/07/2022, 9:07 AM     Attending physician's note   I have taken history, reviewed the chart and examined the patient. I performed a substantive portion of this encounter, including complete performance of at least one of the key components, in conjunction with the APP. I agree with the Advanced Practitioner's note, impression and recommendations.   Doing much better.  Tolerating p.o. well No further melena. Hb 8.6  Ampullary CA s/p 10Fr 6 cm SEMS 7/10. 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  Transfuse as needed to keep Hb>7.  I suspect he would be transfusion dependent. Repeat EGD only if any active bleeding. Will sign off for now   Edman Circle, MD [facility] [phone]

## 2022-08-07 NOTE — Progress Notes (Signed)
[facility]                                                                               Carl Woods, is a 89 y.o. male, DOB - Sep 15, 1931, ZHY:[id number] Admit date - 08/01/2022    Outpatient Primary MD for the patient is [name], MD  LOS - 6  days    Brief summary   89 y.o. male with past medical history significant for paroxysmal atrial fibrillation, ampullary carcinoma, history of AVMs, sacral angiodysplasia, BPH, CKD stage IIIb, history of diverticulosis, hypothyroidism, OSA, obesity, BPH and recent hospitalization for GI bleed secondary to prepyloric ulcer s/p clipping who presented to [facility] ED on 7/5 from home for altered mental status.  Patient recently discharged from [facility] rehab Monday . But he started getting more confused  and lethargic on Thursday.  CT head without contrast with no acute intracranial abnormality. GI was consulted. 2 unit PRBCs ordered. [facility] consulted for admission for further evaluation and management of recurrent GI bleed.   Assessment & Plan    Assessment and Plan: * Acute  metabolic encephalopathy suspect from severe anemia . CT head without contrast with no acute findings.  MRI brain without contrast is neg for stroke.   Symptomatic anemia/ GI bleed:  Tarry stools.  Hemoglobin 5 on admission. FOBT positive.  Anemia panel unremarkable. 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 Continue with PPI BID.  Hemoglobin 5 on admission underwent 2 units of PRBC improved to 10.2, Slowly is trending down to 7.7 today. 1 more unit of prbc ordered for transfusion. Hemoglobin improved to 8.6. 2 BM today.    Hx ampullary cancer s/p ERCP with plastic stent placement Cholangitis Patient with previous ERCP with plastic stent placement was planned to have repeat ERCP soon for metal stent placement.  On EGD 7/6;  plastic stent no longer visualized and prolonged observation of the ampullary area revealed passage of purulent bile.  He is scheduled for ERCP and stent placement today.   Atrial fibrillation (HCC) Rate controlled.  Patient has not taken Eliquis since June.   Hypothyroidism:  Resume synthroid 112 mcg daily.   Hypokalemia and Hypomagnesemia Replaced.    Hypertension;  BP parameters are optimal.   Stage 3b CKD Creatinine appears to be at baseline.     Body mass index is 32.85 kg/m. Obesity.   Goals of care.  Recurrent hospitalization,  Overall poor prognosis.  Palliative consulted.     Thrombocytopenia:  Monitor.      Estimated body mass index is 32.85 kg/m as calculated from the following:   Height as of this encounter: 5\' 8"  (1.727 m).   Weight as of this encounter: 98 kg.  Code Status: full code.  DVT Prophylaxis:  SCDs Start: 08/01/22 2125   Level of Care: Level of care: Telemetry Family Communication: Updated patient's family at bedside.   Disposition Plan:     Remains inpatient appropriate:  possible d/c in am.  Procedures:  ERCP EDP  Consultants:   Gi Consult Palliative care Antimicrobials:   Anti-infectives (From admission, onward)  Start     Dose/Rate Route Frequency Ordered Stop   08/02/22 2200  piperacillin-tazobactam (ZOSYN) IVPB 3.375 g        3.375 g 12.5 mL/hr over 240 Minutes Intravenous Every 8 hours 08/02/22 1240 08/08/22 2359   08/02/22 1400  piperacillin-tazobactam (ZOSYN) IVPB 3.375 g        3.375 g 100 mL/hr over 30 Minutes Intravenous  Once 08/02/22 1238 08/02/22 1519        Medications  Scheduled Meds:  Chlorhexidine Gluconate Cloth  6 each Topical Daily   cholecalciferol  5,000 Units Oral QPM   insulin aspart  0-5 Units Subcutaneous QHS   insulin aspart  0-9 Units Subcutaneous TID WC   levothyroxine  112 mcg Oral Q0600   pantoprazole  40 mg Intravenous Q12H   sodium chloride flush  3 mL Intravenous Q12H    Continuous Infusions:  piperacillin-tazobactam (ZOSYN)  IV 3.375 g (08/07/22 1436)   PRN Meds:.acetaminophen **OR** acetaminophen, polyethylene glycol    Subjective:   Kaelob Persky was seen and examined today.  No new complaints.   Objective:   Vitals:   08/06/22 1757 08/06/22 2111 08/07/22 0503 08/07/22 1428  BP: (!) 102/48 (!) 126/55 (!) 110/56 (!) 109/39  Pulse: (!) 53 67 (!) 56 (!) 46  Resp: 16 16 14 18   Temp:  97.9 F (36.6 C) (!) 97.5 F (36.4 C) (!) 97.4 F (36.3 C)  TempSrc:  Oral Oral Oral  SpO2: 99% 99% 97% 100%  Weight:      Height:        Intake/Output Summary (Last 24 hours) at 08/07/2022 1546 Last data filed at 08/07/2022 1432 Gross per 24 hour  Intake 968 ml  Output 1900 ml  Net -932 ml   Filed Weights   08/01/22 1402 08/02/22 1052  Weight: 92 kg 98 kg     Exam General exam: Appears calm and comfortable  Respiratory system: Clear to auscultation. Respiratory effort normal. Cardiovascular system: S1 & S2 heard, RRR. No JVD,  No pedal edema. Gastrointestinal system: Abdomen is nondistended, soft and nontender.  Central nervous system: Alert and oriented to person and place.  Extremities: Symmetric 5 x 5 power. Skin: No rashes, lesions or ulcers Psychiatry: Mood & affect appropriate.    Data Reviewed:  I have personally reviewed following labs and imaging studies   CBC Lab Results  Component Value Date   WBC 6.4 08/07/2022   RBC 2.84 (L) 08/07/2022   HGB 8.6 (L) 08/07/2022   HCT 27.3 (L) 08/07/2022   MCV 96.1 08/07/2022   MCH 30.3 08/07/2022   PLT 136 (L) 08/07/2022   MCHC 31.5 08/07/2022   RDW 24.8 (H) 08/07/2022   LYMPHSABS 0.6 (L) 08/06/2022   MONOABS 0.3 08/06/2022   EOSABS 0.2 08/06/2022   BASOSABS 0.0 08/06/2022     Last metabolic panel Lab Results  Component Value Date   NA 135 08/07/2022   K 4.5 08/07/2022   CL 108 08/07/2022   CO2 21 (L) 08/07/2022   BUN 31 (H) 08/07/2022   CREATININE 1.41 (H) 08/07/2022   GLUCOSE  97 08/07/2022   GFRNONAA 48 (L) 08/07/2022   GFRAA 55 (L) 02/23/2018   CALCIUM 8.3 (L) 08/07/2022   PHOS 3.2 08/03/2022   PROT 5.4 (L) 08/07/2022   ALBUMIN 2.2 (L) 08/07/2022   BILITOT 0.6 08/07/2022   ALKPHOS 56 08/07/2022   AST 30 08/07/2022   ALT 18 08/07/2022   ANIONGAP 6 08/07/2022    CBG (last 3)  Recent Labs    08/06/22 1212 08/06/22 1729 08/06/22 2135  GLUCAP 86 145* 192*      Coagulation Profile: Recent Labs  Lab 08/01/22 1538  INR 1.2     Radiology Studies: MR BRAIN WO CONTRAST  Result Date: 08/07/2022 CLINICAL DATA:  Mental status change EXAM: MRI HEAD WITHOUT CONTRAST TECHNIQUE: Multiplanar, multiecho pulse sequences of the brain and surrounding structures were obtained without intravenous contrast. COMPARISON:  No prior MRI available, correlation is made with CT head 08/01/2022 FINDINGS: Brain: No restricted diffusion to suggest acute or subacute infarct. No acute hemorrhage, mass, mass effect, or midline shift. No hydrocephalus or extra-axial collection. Normal pituitary and craniocervical junction. No hemosiderin deposition to suggest remote hemorrhage. Normal cerebral volume for age. Vascular: Normal arterial flow voids. Skull and upper cervical spine: Normal marrow signal. Sinuses/Orbits: Mucosal thickening in the ethmoid air cells. Status post bilateral lens replacements. Other: Fluid in the mastoid air cells. IMPRESSION: No acute intracranial process. No evidence of acute or subacute infarct. Electronically Signed   By: Wiliam Ke M.D.   On: 08/07/2022 02:09   DG ERCP  Result Date: 08/06/2022 CLINICAL DATA:  [id number] Surgery, elective [id number] EXAM: ERCP COMPARISON:  CT AP, 07/07/2022. FLUOROSCOPY: Exposure Index (as provided by the fluoroscopic device): 20.5 mGy Kerma FINDINGS: Limited oblique planar images of the RIGHT upper quadrant obtained C-arm. Images demonstrating flexible endoscopy, biliary duct cannulation, sphincterotomy, retrograde cholangiogram  and balloon sweep. A metallic biliary stent is placed at the end of the case. IMPRESSION: Fluoroscopic imaging for ERCP and metallic biliary stent placement. For complete description of intra procedural findings, please see performing service dictation. Electronically Signed   By: Roanna Banning M.D.   On: 08/06/2022 17:08       Kathlen Mody M.D. [facility] 08/07/2022, 3:46 PM  Available via Epic secure chat 7am-7pm After 7 pm, please refer to night coverage provider listed on amion.

## 2022-08-07 NOTE — Anesthesia Postprocedure Evaluation (Signed)
Anesthesia Post Note  Patient: Carl Woods  Procedure(s) Performed: ENDOSCOPIC RETROGRADE CHOLANGIOPANCREATOGRAPHY (ERCP) BILIARY STENT PLACEMENT REMOVAL OF STONES     Patient location during evaluation: Endoscopy Anesthesia Type: General Level of consciousness: awake and alert Pain management: pain level controlled Vital Signs Assessment: post-procedure vital signs reviewed and stable Respiratory status: spontaneous breathing, nonlabored ventilation, respiratory function stable and patient connected to nasal cannula oxygen Cardiovascular status: blood pressure returned to baseline and stable Postop Assessment: no apparent nausea or vomiting Anesthetic complications: no   No notable events documented.  Last Vitals:  Vitals:   08/06/22 2111 08/07/22 0503  BP: (!) 126/55 (!) 110/56  Pulse: 67 (!) 56  Resp: 16 14  Temp: 36.6 C (!) 36.4 C  SpO2: 99% 97%    Last Pain:  Vitals:   08/07/22 0503  TempSrc: Oral  PainSc:                  Ahliya Glatt S

## 2022-08-07 NOTE — Progress Notes (Signed)
Physical Therapy Treatment Patient Details Name: Carl Woods MRN: [id number] DOB: [birth year] Today's Date: 08/07/2022   History of Present Illness Pt is a 89 year old male admitted 08/01/22 for acute metabolic encephalopathy, acute blood loss anemia, melena. Past medical history significant for paroxysmal atrial fibrillation, ampullary carcinoma, history of AVMs, sacral angiodysplasia, BPH, CKD stage IIIb, history of diverticulosis, hypothyroidism, OSA, obesity, BPH and recent hospitalization for GI bleed secondary to prepyloric ulcer s/p clipping. Patient recently discharged from SNF rehab Monday in which he is currently living at home alone. ERCP scheduled for 7/10.    PT Comments  Pt assisted with ambulating however only tolerated short distance due to fatigue.  Pt overall min assist for mobilizing at this time.  Pt also performed a couple exercise sitting in recliner.  Patient will benefit from continued inpatient follow up therapy, <3 hours/day      Assistance Recommended at Discharge Intermittent Supervision/Assistance  If plan is discharge home, recommend the following:  Can travel by private vehicle    Assistance with cooking/housework;Direct supervision/assist for medications management;Assist for transportation;Help with stairs or ramp for entrance;A little help with walking and/or transfers;A little help with bathing/dressing/bathroom   Yes  Equipment Recommendations  None recommended by PT    Recommendations for Other Services       Precautions / Restrictions Precautions Precautions: Fall     Mobility  Bed Mobility Overal bed mobility: Needs Assistance Bed Mobility: Supine to Sit     Supine to sit: Min guard, HOB elevated          Transfers Overall transfer level: Needs assistance Equipment used: Rolling walker (2 wheels) Transfers: Sit to/from Stand Sit to Stand: Min assist           General transfer comment: verbal cues for hand placement, assist to  rise and steady    Ambulation/Gait Ambulation/Gait assistance: Min assist Gait Distance (Feet): 15 Feet Assistive device: Rolling walker (2 wheels) Gait Pattern/deviations: Step-through pattern, Decreased stride length, Trunk flexed Gait velocity: decr     General Gait Details: cues for RW positioning and posture; distance limited by pt fatigue   Stairs             Wheelchair Mobility     Tilt Bed    Modified Rankin (Stroke Patients Only)       Balance                                            Cognition Arousal/Alertness: Awake/alert Behavior During Therapy: WFL for tasks assessed/performed Overall Cognitive Status: Within Functional Limits for tasks assessed                                          Exercises General Exercises - Lower Extremity Ankle Circles/Pumps: AROM, Both, 15 reps, Seated Long Arc Quad: AROM, Both, 10 reps, Seated Hip Flexion/Marching: AROM, Both, 10 reps, Seated    General Comments        Pertinent Vitals/Pain Pain Assessment Pain Assessment: No/denies pain    Home Living                          Prior Function            PT Goals (current goals  can now be found in the care plan section) Progress towards PT goals: Progressing toward goals    Frequency    Min 1X/week      PT Plan Current plan remains appropriate    Co-evaluation              AM-PAC PT "6 Clicks" Mobility   Outcome Measure  Help needed turning from your back to your side while in a flat bed without using bedrails?: A Little Help needed moving from lying on your back to sitting on the side of a flat bed without using bedrails?: A Little Help needed moving to and from a bed to a chair (including a wheelchair)?: A Little Help needed standing up from a chair using your arms (e.g., wheelchair or bedside chair)?: A Lot Help needed to walk in hospital room?: A Lot Help needed climbing 3-5 steps with  a railing? : A Lot 6 Click Score: 15    End of Session Equipment Utilized During Treatment: Gait belt Activity Tolerance: Patient limited by fatigue Patient left: in chair;with call bell/phone within reach;with chair alarm set   PT Visit Diagnosis: Muscle weakness (generalized) (M62.81);Difficulty in walking, not elsewhere classified (R26.2)     Time: 1030-1045 PT Time Calculation (min) (ACUTE ONLY): 15 min  Charges:    $Gait Training: 8-22 mins PT General Charges $$ ACUTE PT VISIT: 1 Visit                    Paulino Door, DPT Physical Therapist Acute Rehabilitation Services Office: [phone]   Janan Halter [place] 08/07/2022, 12:24 PM

## 2022-08-07 NOTE — TOC Progression Note (Signed)
Transition of Care [facility]) - Progression Note    Patient Details  Name: Carl Woods MRN: [id number] Date of Birth: [birth year]  [facility] CM/SW Contact  Beckie Busing, RN Phone Number:[phone]  08/07/2022, 1:56 PM  Clinical Narrative:    [facility] has approved SNF admit. Per Tammy with [facility] patient approved for SNF [phone] for 7 day. Patient will be starting on day 18 because he was just recently discharged from [facility]. Patient will have only 2 days before he begins co pays. Co pay is $184/day. Ambulance transport has been approved [phone]. Tammy states that she will call this information in to [facility] at [employer].    Expected Discharge Plan: Skilled Nursing Facility Barriers to Discharge: Continued Medical Work up  Expected Discharge Plan and Services In-house Referral: NA Discharge Planning Services: CM Consult Post Acute Care Choice: Skilled Nursing Facility Living arrangements for the past 2 months: Single Family Home                 DME Arranged: N/A DME Agency: NA       HH Arranged: NA HH Agency: NA         Social Determinants of Health (SDOH) Interventions SDOH Screenings   Food Insecurity: No Food Insecurity (07/08/2022)  Housing: Patient Declined (07/08/2022)  Transportation Needs: Patient Declined (07/08/2022)  Utilities: Patient Declined (07/08/2022)  Depression (PHQ2-9): Low Risk  (02/18/2022)  Tobacco Use: Medium Risk (08/01/2022)    Readmission Risk Interventions    08/04/2022    3:58 PM 07/11/2022   12:01 PM 07/07/2022    1:52 PM  Readmission Risk Prevention Plan  Transportation Screening Complete  Complete  PCP or Specialist Appt within 3-5 Days Complete  Complete  HRI or Home Care Consult Complete  Complete  Social Work Consult for Recovery Care Planning/Counseling Complete  Complete  Palliative Care Screening Complete Complete Not Applicable  Medication Review Oceanographer) Complete  Complete

## 2022-08-08 DIAGNOSIS — K5521 Angiodysplasia of colon with hemorrhage: Secondary | ICD-10-CM | POA: Diagnosis not present

## 2022-08-08 DIAGNOSIS — N183 Chronic kidney disease, stage 3 unspecified: Secondary | ICD-10-CM | POA: Diagnosis not present

## 2022-08-08 DIAGNOSIS — Z66 Do not resuscitate: Secondary | ICD-10-CM | POA: Diagnosis present

## 2022-08-08 DIAGNOSIS — R4182 Altered mental status, unspecified: Secondary | ICD-10-CM | POA: Diagnosis not present

## 2022-08-08 DIAGNOSIS — Z87891 Personal history of nicotine dependence: Secondary | ICD-10-CM | POA: Diagnosis not present

## 2022-08-08 DIAGNOSIS — I4891 Unspecified atrial fibrillation: Secondary | ICD-10-CM | POA: Diagnosis not present

## 2022-08-08 DIAGNOSIS — K921 Melena: Secondary | ICD-10-CM | POA: Diagnosis not present

## 2022-08-08 DIAGNOSIS — R2681 Unsteadiness on feet: Secondary | ICD-10-CM | POA: Diagnosis not present

## 2022-08-08 DIAGNOSIS — Z923 Personal history of irradiation: Secondary | ICD-10-CM | POA: Diagnosis not present

## 2022-08-08 DIAGNOSIS — N186 End stage renal disease: Secondary | ICD-10-CM | POA: Diagnosis not present

## 2022-08-08 DIAGNOSIS — K922 Gastrointestinal hemorrhage, unspecified: Secondary | ICD-10-CM | POA: Diagnosis not present

## 2022-08-08 DIAGNOSIS — F0390 Unspecified dementia without behavioral disturbance: Secondary | ICD-10-CM | POA: Diagnosis present

## 2022-08-08 DIAGNOSIS — Z7901 Long term (current) use of anticoagulants: Secondary | ICD-10-CM | POA: Diagnosis not present

## 2022-08-08 DIAGNOSIS — Z7189 Other specified counseling: Secondary | ICD-10-CM | POA: Diagnosis not present

## 2022-08-08 DIAGNOSIS — D649 Anemia, unspecified: Secondary | ICD-10-CM | POA: Diagnosis not present

## 2022-08-08 DIAGNOSIS — K2991 Gastroduodenitis, unspecified, with bleeding: Secondary | ICD-10-CM | POA: Diagnosis present

## 2022-08-08 DIAGNOSIS — K7581 Nonalcoholic steatohepatitis (NASH): Secondary | ICD-10-CM | POA: Diagnosis present

## 2022-08-08 DIAGNOSIS — N179 Acute kidney failure, unspecified: Secondary | ICD-10-CM | POA: Diagnosis not present

## 2022-08-08 DIAGNOSIS — W888XXA Exposure to other ionizing radiation, initial encounter: Secondary | ICD-10-CM | POA: Diagnosis not present

## 2022-08-08 DIAGNOSIS — Z85828 Personal history of other malignant neoplasm of skin: Secondary | ICD-10-CM | POA: Diagnosis not present

## 2022-08-08 DIAGNOSIS — K219 Gastro-esophageal reflux disease without esophagitis: Secondary | ICD-10-CM | POA: Diagnosis not present

## 2022-08-08 DIAGNOSIS — Z7401 Bed confinement status: Secondary | ICD-10-CM | POA: Diagnosis not present

## 2022-08-08 DIAGNOSIS — K259 Gastric ulcer, unspecified as acute or chronic, without hemorrhage or perforation: Secondary | ICD-10-CM | POA: Diagnosis not present

## 2022-08-08 DIAGNOSIS — I5031 Acute diastolic (congestive) heart failure: Secondary | ICD-10-CM | POA: Diagnosis not present

## 2022-08-08 DIAGNOSIS — I4821 Permanent atrial fibrillation: Secondary | ICD-10-CM

## 2022-08-08 DIAGNOSIS — Z8041 Family history of malignant neoplasm of ovary: Secondary | ICD-10-CM | POA: Diagnosis not present

## 2022-08-08 DIAGNOSIS — K766 Portal hypertension: Secondary | ICD-10-CM | POA: Diagnosis not present

## 2022-08-08 DIAGNOSIS — K2981 Duodenitis with bleeding: Secondary | ICD-10-CM | POA: Diagnosis present

## 2022-08-08 DIAGNOSIS — N1832 Chronic kidney disease, stage 3b: Secondary | ICD-10-CM | POA: Diagnosis present

## 2022-08-08 DIAGNOSIS — J90 Pleural effusion, not elsewhere classified: Secondary | ICD-10-CM | POA: Diagnosis not present

## 2022-08-08 DIAGNOSIS — I517 Cardiomegaly: Secondary | ICD-10-CM | POA: Diagnosis not present

## 2022-08-08 DIAGNOSIS — I509 Heart failure, unspecified: Secondary | ICD-10-CM | POA: Diagnosis not present

## 2022-08-08 DIAGNOSIS — K7460 Unspecified cirrhosis of liver: Secondary | ICD-10-CM | POA: Diagnosis present

## 2022-08-08 DIAGNOSIS — Z79890 Hormone replacement therapy (postmenopausal): Secondary | ICD-10-CM | POA: Diagnosis not present

## 2022-08-08 DIAGNOSIS — E46 Unspecified protein-calorie malnutrition: Secondary | ICD-10-CM | POA: Diagnosis not present

## 2022-08-08 DIAGNOSIS — K264 Chronic or unspecified duodenal ulcer with hemorrhage: Secondary | ICD-10-CM | POA: Diagnosis not present

## 2022-08-08 DIAGNOSIS — D519 Vitamin B12 deficiency anemia, unspecified: Secondary | ICD-10-CM | POA: Diagnosis not present

## 2022-08-08 DIAGNOSIS — I4819 Other persistent atrial fibrillation: Secondary | ICD-10-CM | POA: Diagnosis present

## 2022-08-08 DIAGNOSIS — R9431 Abnormal electrocardiogram [ECG] [EKG]: Secondary | ICD-10-CM | POA: Diagnosis not present

## 2022-08-08 DIAGNOSIS — R918 Other nonspecific abnormal finding of lung field: Secondary | ICD-10-CM | POA: Diagnosis not present

## 2022-08-08 DIAGNOSIS — C241 Malignant neoplasm of ampulla of Vater: Secondary | ICD-10-CM | POA: Diagnosis not present

## 2022-08-08 DIAGNOSIS — I480 Paroxysmal atrial fibrillation: Secondary | ICD-10-CM | POA: Diagnosis not present

## 2022-08-08 DIAGNOSIS — Y842 Radiological procedure and radiotherapy as the cause of abnormal reaction of the patient, or of later complication, without mention of misadventure at the time of the procedure: Secondary | ICD-10-CM | POA: Diagnosis present

## 2022-08-08 DIAGNOSIS — D500 Iron deficiency anemia secondary to blood loss (chronic): Secondary | ICD-10-CM | POA: Diagnosis not present

## 2022-08-08 DIAGNOSIS — E611 Iron deficiency: Secondary | ICD-10-CM | POA: Diagnosis not present

## 2022-08-08 DIAGNOSIS — Z515 Encounter for palliative care: Secondary | ICD-10-CM | POA: Diagnosis not present

## 2022-08-08 DIAGNOSIS — R040 Epistaxis: Secondary | ICD-10-CM | POA: Diagnosis not present

## 2022-08-08 DIAGNOSIS — I214 Non-ST elevation (NSTEMI) myocardial infarction: Secondary | ICD-10-CM | POA: Diagnosis not present

## 2022-08-08 DIAGNOSIS — K31811 Angiodysplasia of stomach and duodenum with bleeding: Secondary | ICD-10-CM | POA: Diagnosis present

## 2022-08-08 DIAGNOSIS — I2510 Atherosclerotic heart disease of native coronary artery without angina pectoris: Secondary | ICD-10-CM | POA: Diagnosis not present

## 2022-08-08 DIAGNOSIS — G9341 Metabolic encephalopathy: Secondary | ICD-10-CM | POA: Diagnosis not present

## 2022-08-08 DIAGNOSIS — G9340 Encephalopathy, unspecified: Secondary | ICD-10-CM | POA: Diagnosis not present

## 2022-08-08 DIAGNOSIS — K2990 Gastroduodenitis, unspecified, without bleeding: Secondary | ICD-10-CM | POA: Diagnosis not present

## 2022-08-08 DIAGNOSIS — I959 Hypotension, unspecified: Secondary | ICD-10-CM | POA: Diagnosis not present

## 2022-08-08 DIAGNOSIS — Z8249 Family history of ischemic heart disease and other diseases of the circulatory system: Secondary | ICD-10-CM | POA: Diagnosis not present

## 2022-08-08 DIAGNOSIS — R531 Weakness: Secondary | ICD-10-CM | POA: Diagnosis not present

## 2022-08-08 DIAGNOSIS — K2980 Duodenitis without bleeding: Secondary | ICD-10-CM | POA: Diagnosis not present

## 2022-08-08 DIAGNOSIS — K5520 Angiodysplasia of colon without hemorrhage: Secondary | ICD-10-CM | POA: Diagnosis not present

## 2022-08-08 DIAGNOSIS — I5032 Chronic diastolic (congestive) heart failure: Secondary | ICD-10-CM | POA: Diagnosis present

## 2022-08-08 DIAGNOSIS — E039 Hypothyroidism, unspecified: Secondary | ICD-10-CM | POA: Diagnosis not present

## 2022-08-08 DIAGNOSIS — D62 Acute posthemorrhagic anemia: Secondary | ICD-10-CM | POA: Diagnosis not present

## 2022-08-08 DIAGNOSIS — K31819 Angiodysplasia of stomach and duodenum without bleeding: Secondary | ICD-10-CM | POA: Diagnosis not present

## 2022-08-08 DIAGNOSIS — K3189 Other diseases of stomach and duodenum: Secondary | ICD-10-CM | POA: Diagnosis not present

## 2022-08-08 DIAGNOSIS — N400 Enlarged prostate without lower urinary tract symptoms: Secondary | ICD-10-CM | POA: Diagnosis present

## 2022-08-08 LAB — GLUCOSE, CAPILLARY
Glucose-Capillary: 82 mg/dL (ref 70–99)
Glucose-Capillary: 87 mg/dL (ref 70–99)

## 2022-08-08 LAB — HEMOGLOBIN AND HEMATOCRIT, BLOOD
HCT: 28.2 % — ABNORMAL LOW (ref 39.0–52.0)
Hemoglobin: 8.4 g/dL — ABNORMAL LOW (ref 13.0–17.0)

## 2022-08-08 MED ORDER — SUCRALFATE 1 GM/10ML PO SUSP
1.0000 g | Freq: Three times a day (TID) | ORAL | Status: DC
  Administered 2022-08-08: 1 g via ORAL
  Filled 2022-08-08: qty 10

## 2022-08-08 MED ORDER — METOPROLOL SUCCINATE ER 25 MG PO TB24
12.5000 mg | ORAL_TABLET | Freq: Every day | ORAL | Status: DC
  Filled 2022-08-08: qty 1

## 2022-08-08 MED ORDER — DAPAGLIFLOZIN PROPANEDIOL 10 MG PO TABS
10.0000 mg | ORAL_TABLET | Freq: Every day | ORAL | Status: DC
  Administered 2022-08-08: 10 mg via ORAL
  Filled 2022-08-08: qty 1

## 2022-08-08 NOTE — Discharge Summary (Signed)
Physician Discharge Summary   Patient: Carl Woods MRN: [id number] DOB: 1931/08/30  Admit date:     08/01/2022  Discharge date: 08/08/22  Discharge Physician: Kathlen Mody   PCP: [name], MD   Recommendations at discharge:  Please follow up with PCP in [facility].  Please follow up with cbc and BMP in one week.   Discharge Diagnoses: Principal Problem:   Acute encephalopathy Active Problems:   Atrial fibrillation (HCC)   Symptomatic anemia   Hypervolemia   Acute blood loss anemia   GI bleeding   Melena   AVM (arteriovenous malformation) of small bowel, acquired with hemorrhage   GAVE (gastric antral vascular ectasia)   DNR (do not resuscitate)   Goals of care, counseling/discussion   Counseling and coordination of care   Palliative care encounter   Need for emotional support    Hospital Course: 89 y.o. male with past medical history significant for paroxysmal atrial fibrillation, ampullary carcinoma, history of AVMs, sacral angiodysplasia, BPH, CKD stage IIIb, history of diverticulosis, hypothyroidism, OSA, obesity, BPH and recent hospitalization for GI bleed secondary to prepyloric ulcer s/p clipping who presented to [facility] ED on 7/5 from home for altered mental status.  Patient recently discharged from [facility] rehab Monday . But he started getting more confused  and lethargic on Thursday.  CT head without contrast with no acute intracranial abnormality. GI was consulted. 2 unit PRBCs ordered. TRH consulted for admission for further evaluation and management of recurrent GI bleed.   Assessment and Plan: Acute  metabolic encephalopathy Appears to have resolved.    Symptomatic anemia/ GI bleed:  Tarry stools.  Hemoglobin 5 on admission. FOBT positive.  Anemia panel unremarkable. 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 Continue with  PPI BID.  Hemoglobin 5 on admission underwent 2 units of PRBC improved to 10.2, decreased to 7.8 and to 8.6 and stable at 8.4 today.    Hx ampullary cancer s/p ERCP with plastic stent placement Cholangitis Patient with previous ERCP with plastic stent placement was planned to have repeat ERCP soon for metal stent placement.  On EGD 7/6; plastic stent no longer visualized and prolonged observation of the ampullary area revealed passage of purulent bile.  He is scheduled for ERCP and stent placement today.    Atrial fibrillation (HCC) Rate between 4o to 50's, so we discontinued the metoprolol 12.5 mg daily.  Patient has not taken Eliquis since June.     Hypothyroidism:  Resume synthroid 112 mcg daily.    Hypokalemia and Hypomagnesemia Replaced.      Hypertension;  BP parameters have improved. Continue to monitor.      Stage 3b CKD Creatinine appears to be at baseline.        Body mass index is 32.85 kg/m. Obesity.    Goals of care.  Recurrent hospitalization,  Overall poor prognosis.  Palliative consulted.         Consultants: gastroenterology.  Procedures performed: stent placement.   Disposition: Skilled nursing facility Diet recommendation:  Discharge Diet Orders (From admission, onward)     Start     Ordered   08/08/22 0000  Diet - low sodium heart healthy        08/08/22 1117           Regular diet DISCHARGE MEDICATION: Allergies as of 08/08/2022       Reactions   Nsaids Other (See Comments)  Patient is to not take these because of kidney issues        Medication List     TAKE these medications    bumetanide 1 MG tablet Commonly known as: BUMEX Take 1 tablet (1 mg total) by mouth daily.   dapagliflozin propanediol 10 MG Tabs tablet Commonly known as: FARXIGA Take 10 mg by mouth in the morning.   fexofenadine 180 MG tablet Commonly known as: ALLEGRA Take 180 mg by mouth daily as needed for allergies or rhinitis.   Fusion 65-65-25-30  MG Caps Take 1 capsule by mouth every Monday, Wednesday, and Friday.   GLUCOSAMINE CHONDR 500 COMPLEX PO Take 1 tablet by mouth daily.   Glucosamine Sulfate 500 MG Tabs Take 500 mg by mouth daily.   ketoconazole 2 % cream Commonly known as: NIZORAL Apply 1 Application topically 2 (two) times daily as needed (skin rash/irritation- groin area).   levothyroxine 112 MCG tablet Commonly known as: SYNTHROID Take 112 mcg by mouth daily before breakfast.   metoprolol succinate 25 MG 24 hr tablet Commonly known as: TOPROL-XL Take 0.5 tablets (12.5 mg total) by mouth daily. Take with or immediately following a meal.   nitroGLYCERIN 0.4 MG SL tablet Commonly known as: NITROSTAT Place 1 tablet (0.4 mg total) under the tongue every 5 (five) minutes as needed for up to 25 days for chest pain.   olopatadine 0.1 % ophthalmic solution Commonly known as: PATANOL Place 1 drop into both eyes daily as needed for allergies.   pantoprazole 40 MG tablet Commonly known as: PROTONIX Take 1 tablet (40 mg total) by mouth 2 (two) times daily.   potassium chloride SA 20 MEQ tablet Commonly known as: KLOR-CON M Take 1 tablet (20 mEq total) by mouth 2 (two) times daily. What changed: when to take this   sodium chloride 0.65 % Soln nasal spray Commonly known as: OCEAN Place 1 spray into both nostrils as needed for congestion.   sucralfate 1 GM/10ML suspension Commonly known as: CARAFATE Take 10 mLs (1 g total) by mouth 4 (four) times daily -  with meals and at bedtime.   Vitamin D3 125 MCG (5000 UT) Caps Take 5,000 Units by mouth every evening.        Discharge Exam: Filed Weights   08/01/22 1402 08/02/22 1052  Weight: 92 kg 98 kg   General exam: Appears calm and comfortable  Respiratory system: Clear to auscultation. Respiratory effort normal. Cardiovascular system: S1 & S2 heard, RRR. No JVD,  Gastrointestinal system: Abdomen is nondistended, soft and nontender.  Central nervous  system: Alert and oriented. No focal neurological deficits. Extremities: Symmetric 5 x 5 power. Skin: No rashes,  Psychiatry:  Mood & affect appropriate.    Condition at discharge: fair  The results of significant diagnostics from this hospitalization (including imaging, microbiology, ancillary and laboratory) are listed below for reference.   Imaging Studies: MR BRAIN WO CONTRAST  Result Date: 08/07/2022 CLINICAL DATA:  Mental status change EXAM: MRI HEAD WITHOUT CONTRAST TECHNIQUE: Multiplanar, multiecho pulse sequences of the brain and surrounding structures were obtained without intravenous contrast. COMPARISON:  No prior MRI available, correlation is made with CT head 08/01/2022 FINDINGS: Brain: No restricted diffusion to suggest acute or subacute infarct. No acute hemorrhage, mass, mass effect, or midline shift. No hydrocephalus or extra-axial collection. Normal pituitary and craniocervical junction. No hemosiderin deposition to suggest remote hemorrhage. Normal cerebral volume for age. Vascular: Normal arterial flow voids. Skull and upper cervical spine: Normal marrow signal. Sinuses/Orbits:  Mucosal thickening in the ethmoid air cells. Status post bilateral lens replacements. Other: Fluid in the mastoid air cells. IMPRESSION: No acute intracranial process. No evidence of acute or subacute infarct. Electronically Signed   By: Wiliam Ke M.D.   On: 08/07/2022 02:09   DG ERCP  Result Date: 08/06/2022 CLINICAL DATA:  [id number] Surgery, elective [id number] EXAM: ERCP COMPARISON:  CT AP, 07/07/2022. FLUOROSCOPY: Exposure Index (as provided by the fluoroscopic device): 20.5 mGy Kerma FINDINGS: Limited oblique planar images of the RIGHT upper quadrant obtained C-arm. Images demonstrating flexible endoscopy, biliary duct cannulation, sphincterotomy, retrograde cholangiogram and balloon sweep. A metallic biliary stent is placed at the end of the case. IMPRESSION: Fluoroscopic imaging for ERCP and metallic  biliary stent placement. For complete description of intra procedural findings, please see performing service dictation. Electronically Signed   By: Roanna Banning M.D.   On: 08/06/2022 17:08   DG Abd Portable 2V  Result Date: 08/02/2022 CLINICAL DATA:  Biliary stent migration. EGD today. Previously placed biliary stent was not visible via scope. EXAM: PORTABLE ABDOMEN - 2 VIEW COMPARISON:  CT 07/07/2022 FINDINGS: The previously seen biliary stent has migrated and is now located in the right lower quadrant. This could be within the distal small bowel or proximal colon. No free air, organomegaly, or bowel obstruction. No confluent opacity in the lungs. IMPRESSION: Biliary stent has migrated into the right lower quadrant, possibly within the distal small bowel or proximal colon. Electronically Signed   By: Charlett Nose M.D.   On: 08/02/2022 17:22   CT Head Wo Contrast  Result Date: 08/01/2022 CLINICAL DATA:  Altered mental status EXAM: CT HEAD WITHOUT CONTRAST TECHNIQUE: Contiguous axial images were obtained from the base of the skull through the vertex without intravenous contrast. RADIATION DOSE REDUCTION: This exam was performed according to the departmental dose-optimization program which includes automated exposure control, adjustment of the mA and/or kV according to patient size and/or use of iterative reconstruction technique. COMPARISON:  None Available. FINDINGS: Brain: No evidence of acute infarction, hemorrhage, hydrocephalus, extra-axial collection or mass lesion/mass effect. Vascular: No hyperdense vessel or unexpected calcification. Skull: Normal. Negative for fracture or focal lesion. Sinuses/Orbits: No acute finding. Other: None. IMPRESSION: No acute abnormality noted. Electronically Signed   By: Alcide Clever M.D.   On: 08/01/2022 16:15   IR THORACENTESIS ASP PLEURAL SPACE W/IMG GUIDE  Result Date: 07/10/2022 INDICATION: 89 year old male with shortness of breath, previous chest x-ray showed  bilateral pleural effusion. Request for therapeutic thoracentesis. EXAM: ULTRASOUND GUIDED LEFT THORACENTESIS MEDICATIONS: 10 mL 1% lidocaine COMPLICATIONS: None immediate. PROCEDURE: An ultrasound guided thoracentesis was thoroughly discussed with the patient and questions answered. The benefits, risks, alternatives and complications were also discussed. The patient understands and wishes to proceed with the procedure. Written consent was obtained. Ultrasound was performed to localize and mark an adequate pocket of fluid in the left chest. The area was then prepped and draped in the normal sterile fashion. 1% Lidocaine was used for local anesthesia. Under ultrasound guidance a 6 Fr Safe-T-Centesis catheter was introduced. Thoracentesis was performed. The catheter was removed and a dressing applied. FINDINGS: A total of approximately 1.2 L of hazy yellow fluid was removed. Post procedure chest X-ray reviewed, negative for pneumothorax. IMPRESSION: Successful ultrasound guided left thoracentesis yielding 1.2 L of pleural fluid. Performed by: Lawernce Ion, PA-C Electronically Signed   By: Richarda Overlie M.D.   On: 07/10/2022 14:50   DG Chest 1 View  Result Date: 07/10/2022 CLINICAL DATA:  Provided  history: Pleural effusion. Post left-sided thoracentesis, 1.2 mL removed. EXAM: CHEST  1 VIEW COMPARISON:  Prior chest radiographs 07/09/2022 and earlier. CT abdomen/pelvis 07/07/2022. FINDINGS: The cardiomediastinal silhouette is unchanged. Aortic atherosclerosis. Redemonstrated foci of pleural calcification at the level of the mid left lung. No evidence of pneumothorax status post reported thoracentesis. There may be a trace residual left pleural effusion. Improved aeration of the left lung base with mild residual left basilar atelectasis. Persistent small right pleural effusion. Ill-defined opacity within the right lung base. No acute osseous abnormality identified. IMPRESSION: 1. No evidence of pneumothorax status post  reported left thoracentesis. Possible trace residual left pleural effusion. Improved aeration of the left lung base with mild residual left basilar atelectasis. 2. Small right pleural effusion with right basilar atelectasis and/or pneumonia, unchanged. 3. Aortic Atherosclerosis (ICD10-I70.0). Electronically Signed   By: Jackey Loge D.O.   On: 07/10/2022 13:34    Microbiology: Results for orders placed or performed during the hospital encounter of 08/01/22  MRSA Next Gen by PCR, Nasal     Status: None   Collection Time: 08/01/22 11:17 PM   Specimen: Nasal Mucosa; Nasal Swab  Result Value Ref Range Status   MRSA by PCR Next Gen NOT DETECTED NOT DETECTED Final    Comment: (NOTE) The GeneXpert MRSA Assay (FDA approved for NASAL specimens only), is one component of a comprehensive MRSA colonization surveillance program. It is not intended to diagnose MRSA infection nor to guide or monitor treatment for MRSA infections. Test performance is not FDA approved in patients less than 70 years old. Performed at [facility], [address] [place], [place] [address]     Labs: CBC: Recent Labs  Lab 08/01/22 1538 08/02/22 0311 08/04/22 0455 08/04/22 1046 08/05/22 0614 08/05/22 1735 08/06/22 0605 08/06/22 1527 08/07/22 0554  WBC 5.6   < > 7.2  --  7.0  --  5.6 9.2 6.4  NEUTROABS 3.8  --   --   --   --   --   --  8.0*  --   HGB 5.0*   < > 8.3*   < > 8.0* 8.2* 7.7* 7.8* 8.6*  HCT 16.4*   < > 26.2*   < > 25.7* 26.6* 25.0* 26.7* 27.3*  MCV 117.1*   < > 97.8  --  99.6  --  101.2* 106.8* 96.1  PLT 137*   < > 129*  --  130*  --  PLATELET CLUMPS NOTED ON SMEAR, UNABLE TO ESTIMATE PLATELET CLUMPS NOTED ON SMEAR, UNABLE TO ESTIMATE 136*   < > = values in this interval not displayed.   Basic Metabolic Panel: Recent Labs  Lab 08/03/22 0252 08/04/22 0301 08/05/22 0614 08/06/22 0605 08/07/22 0554  NA 144 142 138 136 135  K 3.4* 4.5 4.1 4.3 4.5  CL 114* 117* 109 109 108  CO2  19* 20* 21* 20* 21*  GLUCOSE 124* 103* 97 87 97  BUN 32* 31* 33* 32* 31*  CREATININE 1.58* 1.69* 1.68* 1.49* 1.41*  CALCIUM 8.2* 7.9* 8.2* 8.3* 8.3*  MG 1.4* 2.1 2.0  --   --   PHOS 3.2  --   --   --   --    Liver Function Tests: Recent Labs  Lab 08/03/22 0252 08/04/22 0301 08/05/22 0614 08/06/22 0605 08/07/22 0554  AST 28 23 20 26 30   ALT 17 18 16 15 18   ALKPHOS 60 56 49 49 56  BILITOT 1.0 0.9 0.8 0.6 0.6  PROT  5.3* 5.4* 5.2* 5.0* 5.4*  ALBUMIN 2.5* 2.3* 2.2* 2.1* 2.2*   CBG: Recent Labs  Lab 08/06/22 1729 08/06/22 2135 08/07/22 1656 08/07/22 2139 08/08/22 0744  GLUCAP 145* 192* 92 110* 82    Discharge time spent: 40 minutes.   Signed: Kathlen Mody, MD [facility] 08/08/2022

## 2022-08-08 NOTE — Progress Notes (Signed)
Occupational Therapy Treatment Patient Details Name: Carl Woods MRN: [id number] DOB: November 04, 1931 Today's Date: 08/08/2022   History of present illness Pt is a 89 year old male admitted 08/01/22 for acute metabolic encephalopathy, acute blood loss anemia, melena. Past medical history significant for paroxysmal atrial fibrillation, ampullary carcinoma, history of AVMs, sacral angiodysplasia, BPH, CKD stage IIIb, history of diverticulosis, hypothyroidism, OSA, obesity, BPH and recent hospitalization for GI bleed secondary to prepyloric ulcer s/p clipping. Patient recently discharged from SNF rehab Monday in which he is currently living at home alone. ERCP scheduled for 7/10.   OT comments  The pt was motivated to participate in the therapy session. He performed bed mobility, sit to stand using a RW, ambulating using a RW, and grooming in sitting at chair level. He required min assist for functional transfers and set-up assist for grooming seated in the chair. He is making gradual functional progress. Continue OT plan of care.    Recommendations for follow up therapy are one component of a multi-disciplinary discharge planning process, led by the attending physician.  Recommendations may be updated based on patient status, additional functional criteria and insurance authorization.    Assistance Recommended at Discharge Frequent or constant Supervision/Assistance  Patient can return home with the following  A little help with walking and/or transfers;A little help with bathing/dressing/bathroom;Assistance with cooking/housework;Assist for transportation;Help with stairs or ramp for entrance   Equipment Recommendations  Other (comment) (defer to next level of care)    Recommendations for Other Services      Precautions / Restrictions Precautions Precautions: Fall Restrictions Weight Bearing Restrictions: No       Mobility Bed Mobility Overal bed mobility: Needs Assistance Bed Mobility:  Supine to Sit     Supine to sit: Min guard, HOB elevated          Transfers Overall transfer level: Needs assistance Equipment used: Rolling walker (2 wheels) Transfers: Sit to/from Stand Sit to Stand: Min assist           General transfer comment: verbal cues for hand placement, assist to rise and steady         ADL either performed or assessed with clinical judgement   ADL Overall ADL's : Needs assistance/impaired Eating/Feeding: Set up;Sitting   Grooming: Set up;Sitting Grooming Details (indicate cue type and reason): The pt performed hand washing and teeth brushing in sittting in the chair.         Upper Body Dressing : Minimal assistance;Sitting   Lower Body Dressing: Moderate assistance;Sit to/from stand                        Cognition Arousal/Alertness: Awake/alert Behavior During Therapy: WFL for tasks assessed/performed                    Pertinent Vitals/ Pain       Pain Assessment Pain Assessment: No/denies pain         Frequency  Min 1X/week        Progress Toward Goals  OT Goals(current goals can now be found in the care plan section)  Progress towards OT goals: Progressing toward goals  Acute Rehab OT Goals OT Goal Formulation: With patient Time For Goal Achievement: 08/19/22 Potential to Achieve Goals: Good  Plan Discharge plan remains appropriate       AM-PAC OT "6 Clicks" Daily Activity     Outcome Measure   Help from another person eating meals?: A Little Help from  another person taking care of personal grooming?: A Little Help from another person toileting, which includes using toliet, bedpan, or urinal?: A Lot Help from another person bathing (including washing, rinsing, drying)?: A Lot Help from another person to put on and taking off regular upper body clothing?: A Little Help from another person to put on and taking off regular lower body clothing?: A Lot 6 Click Score: 15    End of Session  Equipment Utilized During Treatment: Gait belt;Rolling walker (2 wheels)  OT Visit Diagnosis: Unsteadiness on feet (R26.81);Muscle weakness (generalized) (M62.81)   Activity Tolerance Patient tolerated treatment well   Patient Left in chair;with call bell/phone within reach;with chair alarm set;with family/visitor present   Nurse Communication Mobility status        Time: 1610-9604 OT Time Calculation (min): 29 min  Charges: OT General Charges $OT Visit: 1 Visit OT Treatments $Self Care/Home Management : 8-22 mins $Therapeutic Activity: 8-22 mins     Reuben Likes, OTR/L 08/08/2022, 12:30 PM

## 2022-08-08 NOTE — TOC Transition Note (Signed)
Transition of Care [facility]) - CM/SW Discharge Note   Patient Details  Name: Carl Woods MRN: [id number] Date of Birth: 1931-04-17  Transition of Care [facility]) CM/SW Contact:  Beckie Busing, RN Phone [phone]  08/08/2022, 1:54 PM   Clinical Narrative:    Patient discharging to [facility]. Daughter [name] has been updated. Transportation arranged via [facility]. Discharge packet is at nurses station.  Please call report to [facility] [phone] Room # 201     Final next level of care: Skilled Nursing Facility Barriers to Discharge: No Barriers Identified   Patient Goals and CMS Choice CMS Medicare.gov Compare Post Acute Care list provided to:: Patient Choice offered to / list presented to : Patient  Discharge Placement                Patient chooses bed at: [facility], [place] Patient to be transferred to facility by: [facility] Name of family member notified: Norton Pastel Patient and family notified of of transfer: 08/08/22  Discharge Plan and Services Additional resources added to the After Visit Summary for   In-house Referral: NA Discharge Planning Services: CM Consult Post Acute Care Choice: Skilled Nursing Facility          DME Arranged: N/A DME Agency: NA       HH Arranged: NA HH Agency: NA        Social Determinants of Health (SDOH) Interventions SDOH Screenings   Food Insecurity: No Food Insecurity (07/08/2022)  Housing: Patient Declined (07/08/2022)  Transportation Needs: Patient Declined (07/08/2022)  Utilities: Patient Declined (07/08/2022)  Depression (PHQ2-9): Low Risk  (02/18/2022)  Tobacco Use: Medium Risk (08/01/2022)     Readmission Risk Interventions    08/04/2022    3:58 PM 07/11/2022   12:01 PM 07/07/2022    1:52 PM  Readmission Risk Prevention Plan  Transportation Screening Complete  Complete  PCP or Specialist Appt within 3-5 Days Complete  Complete  HRI or Home Care Consult Complete  Complete  Social Work Consult for Recovery Care  Planning/Counseling Complete  Complete  Palliative Care Screening Complete Complete Not Applicable  Medication Review Oceanographer) Complete  Complete

## 2022-08-08 NOTE — Plan of Care (Signed)

## 2022-08-08 NOTE — Plan of Care (Signed)

## 2022-08-14 ENCOUNTER — Inpatient Hospital Stay: Payer: PPO

## 2022-08-14 ENCOUNTER — Inpatient Hospital Stay: Payer: PPO | Admitting: Hematology

## 2022-08-15 ENCOUNTER — Telehealth: Payer: Self-pay | Admitting: Hematology

## 2022-08-15 ENCOUNTER — Other Ambulatory Visit: Payer: Self-pay

## 2022-08-15 ENCOUNTER — Inpatient Hospital Stay (HOSPITAL_COMMUNITY)
Admission: EM | Admit: 2022-08-15 | Discharge: 2022-08-29 | DRG: 377 | Disposition: A | Discharge status: Skilled Nursing Facility | Payer: PPO | Source: Skilled Nursing Facility | Attending: Internal Medicine | Admitting: Internal Medicine

## 2022-08-15 ENCOUNTER — Emergency Department (HOSPITAL_COMMUNITY): Payer: PPO

## 2022-08-15 DIAGNOSIS — Z87891 Personal history of nicotine dependence: Secondary | ICD-10-CM

## 2022-08-15 DIAGNOSIS — K921 Melena: Secondary | ICD-10-CM | POA: Diagnosis present

## 2022-08-15 DIAGNOSIS — I517 Cardiomegaly: Secondary | ICD-10-CM | POA: Diagnosis not present

## 2022-08-15 DIAGNOSIS — K2980 Duodenitis without bleeding: Secondary | ICD-10-CM

## 2022-08-15 DIAGNOSIS — I5032 Chronic diastolic (congestive) heart failure: Secondary | ICD-10-CM | POA: Diagnosis present

## 2022-08-15 DIAGNOSIS — K2991 Gastroduodenitis, unspecified, with bleeding: Secondary | ICD-10-CM | POA: Diagnosis present

## 2022-08-15 DIAGNOSIS — I4819 Other persistent atrial fibrillation: Secondary | ICD-10-CM | POA: Diagnosis present

## 2022-08-15 DIAGNOSIS — Z85828 Personal history of other malignant neoplasm of skin: Secondary | ICD-10-CM

## 2022-08-15 DIAGNOSIS — Z66 Do not resuscitate: Secondary | ICD-10-CM | POA: Diagnosis present

## 2022-08-15 DIAGNOSIS — Z8041 Family history of malignant neoplasm of ovary: Secondary | ICD-10-CM

## 2022-08-15 DIAGNOSIS — J90 Pleural effusion, not elsewhere classified: Secondary | ICD-10-CM | POA: Diagnosis not present

## 2022-08-15 DIAGNOSIS — Z79890 Hormone replacement therapy (postmenopausal): Secondary | ICD-10-CM

## 2022-08-15 DIAGNOSIS — Y842 Radiological procedure and radiotherapy as the cause of abnormal reaction of the patient, or of later complication, without mention of misadventure at the time of the procedure: Secondary | ICD-10-CM | POA: Diagnosis present

## 2022-08-15 DIAGNOSIS — K2981 Duodenitis with bleeding: Secondary | ICD-10-CM | POA: Diagnosis present

## 2022-08-15 DIAGNOSIS — F0390 Unspecified dementia without behavioral disturbance: Secondary | ICD-10-CM | POA: Diagnosis present

## 2022-08-15 DIAGNOSIS — R4182 Altered mental status, unspecified: Secondary | ICD-10-CM | POA: Diagnosis not present

## 2022-08-15 DIAGNOSIS — I4891 Unspecified atrial fibrillation: Secondary | ICD-10-CM | POA: Diagnosis present

## 2022-08-15 DIAGNOSIS — K7581 Nonalcoholic steatohepatitis (NASH): Secondary | ICD-10-CM | POA: Diagnosis present

## 2022-08-15 DIAGNOSIS — K7460 Unspecified cirrhosis of liver: Secondary | ICD-10-CM | POA: Diagnosis present

## 2022-08-15 DIAGNOSIS — R918 Other nonspecific abnormal finding of lung field: Secondary | ICD-10-CM | POA: Diagnosis not present

## 2022-08-15 DIAGNOSIS — N1832 Chronic kidney disease, stage 3b: Secondary | ICD-10-CM | POA: Diagnosis present

## 2022-08-15 DIAGNOSIS — K31811 Angiodysplasia of stomach and duodenum with bleeding: Secondary | ICD-10-CM | POA: Diagnosis not present

## 2022-08-15 DIAGNOSIS — K5521 Angiodysplasia of colon with hemorrhage: Secondary | ICD-10-CM | POA: Diagnosis present

## 2022-08-15 DIAGNOSIS — E039 Hypothyroidism, unspecified: Secondary | ICD-10-CM | POA: Diagnosis present

## 2022-08-15 DIAGNOSIS — N400 Enlarged prostate without lower urinary tract symptoms: Secondary | ICD-10-CM | POA: Diagnosis present

## 2022-08-15 DIAGNOSIS — R9431 Abnormal electrocardiogram [ECG] [EKG]: Secondary | ICD-10-CM | POA: Diagnosis not present

## 2022-08-15 DIAGNOSIS — G9341 Metabolic encephalopathy: Secondary | ICD-10-CM | POA: Diagnosis not present

## 2022-08-15 DIAGNOSIS — K922 Gastrointestinal hemorrhage, unspecified: Secondary | ICD-10-CM | POA: Diagnosis present

## 2022-08-15 DIAGNOSIS — N183 Chronic kidney disease, stage 3 unspecified: Secondary | ICD-10-CM | POA: Diagnosis present

## 2022-08-15 DIAGNOSIS — K219 Gastro-esophageal reflux disease without esophagitis: Secondary | ICD-10-CM | POA: Diagnosis present

## 2022-08-15 DIAGNOSIS — C241 Malignant neoplasm of ampulla of Vater: Secondary | ICD-10-CM | POA: Diagnosis present

## 2022-08-15 DIAGNOSIS — I2510 Atherosclerotic heart disease of native coronary artery without angina pectoris: Secondary | ICD-10-CM | POA: Diagnosis present

## 2022-08-15 DIAGNOSIS — Z8249 Family history of ischemic heart disease and other diseases of the circulatory system: Secondary | ICD-10-CM

## 2022-08-15 DIAGNOSIS — Z923 Personal history of irradiation: Secondary | ICD-10-CM

## 2022-08-15 DIAGNOSIS — I959 Hypotension, unspecified: Secondary | ICD-10-CM | POA: Diagnosis not present

## 2022-08-15 DIAGNOSIS — D62 Acute posthemorrhagic anemia: Secondary | ICD-10-CM | POA: Diagnosis present

## 2022-08-15 LAB — CBC WITH DIFFERENTIAL/PLATELET
Abs Immature Granulocytes: 0.03 10*3/uL (ref 0.00–0.07)
Basophils Absolute: 0 10*3/uL (ref 0.0–0.1)
Basophils Relative: 1 %
Eosinophils Absolute: 0.1 10*3/uL (ref 0.0–0.5)
Eosinophils Relative: 3 %
HCT: 20.1 % — ABNORMAL LOW (ref 39.0–52.0)
Hemoglobin: 6.1 g/dL — CL (ref 13.0–17.0)
Immature Granulocytes: 1 %
Lymphocytes Relative: 22 %
Lymphs Abs: 1 10*3/uL (ref 0.7–4.0)
MCH: 33 pg (ref 26.0–34.0)
MCHC: 30.3 g/dL (ref 30.0–36.0)
MCV: 108.6 fL — ABNORMAL HIGH (ref 80.0–100.0)
Monocytes Absolute: 0.5 10*3/uL (ref 0.1–1.0)
Monocytes Relative: 12 %
Neutro Abs: 2.6 10*3/uL (ref 1.7–7.7)
Neutrophils Relative %: 61 %
Platelets: 135 10*3/uL — ABNORMAL LOW (ref 150–400)
RBC: 1.85 MIL/uL — ABNORMAL LOW (ref 4.22–5.81)
RDW: 28 % — ABNORMAL HIGH (ref 11.5–15.5)
WBC: 4.3 10*3/uL (ref 4.0–10.5)
nRBC: 0.9 % — ABNORMAL HIGH (ref 0.0–0.2)

## 2022-08-15 LAB — LIPASE, BLOOD: Lipase: 27 U/L (ref 11–51)

## 2022-08-15 LAB — BASIC METABOLIC PANEL
Anion gap: 9 (ref 5–15)
BUN: 32 mg/dL — ABNORMAL HIGH (ref 8–23)
CO2: 22 mmol/L (ref 22–32)
Calcium: 8.7 mg/dL — ABNORMAL LOW (ref 8.9–10.3)
Chloride: 107 mmol/L (ref 98–111)
Creatinine, Ser: 1.7 mg/dL — ABNORMAL HIGH (ref 0.61–1.24)
GFR, Estimated: 38 mL/min — ABNORMAL LOW (ref >60)
Glucose, Bld: 117 mg/dL — ABNORMAL HIGH (ref 70–99)
Potassium: 4.1 mmol/L (ref 3.5–5.1)
Sodium: 138 mmol/L (ref 135–145)

## 2022-08-15 LAB — BPAM RBC: Blood Product Expiration Date: 202408092359

## 2022-08-15 LAB — TYPE AND SCREEN

## 2022-08-15 LAB — PREPARE RBC (CROSSMATCH)

## 2022-08-15 MED ORDER — PANTOPRAZOLE SODIUM 40 MG IV SOLR
40.0000 mg | Freq: Two times a day (BID) | INTRAVENOUS | Status: DC
  Administered 2022-08-19 – 2022-08-24 (×11): 40 mg via INTRAVENOUS
  Filled 2022-08-15 (×11): qty 10

## 2022-08-15 MED ORDER — PANTOPRAZOLE 80MG IVPB - SIMPLE MED
80.0000 mg | Freq: Once | INTRAVENOUS | Status: AC
  Administered 2022-08-16: 80 mg via INTRAVENOUS
  Filled 2022-08-15: qty 100

## 2022-08-15 MED ORDER — PANTOPRAZOLE INFUSION (NEW) - SIMPLE MED
8.0000 mg/h | INTRAVENOUS | Status: AC
  Administered 2022-08-16 – 2022-08-18 (×5): 8 mg/h via INTRAVENOUS
  Filled 2022-08-15 (×9): qty 100

## 2022-08-15 MED ORDER — SODIUM CHLORIDE 0.9% IV SOLUTION: Freq: Once | INTRAVENOUS | Status: AC

## 2022-08-15 NOTE — ED Triage Notes (Signed)
Pt to the ed from facility [facility] for low hemoglobin of 5.4. Pt relays fatigue and weakness with slight SOB. Pt is on a blood thinner. Pt denies recent fall CP, LOC, dizziness.

## 2022-08-15 NOTE — ED Provider Notes (Signed)
[place] EM[facility]ENC[facility] AT [facility] Provider Note   CSN: [id number] Arrival date & time: 08/15/22  1841     History Chief Complaint  Patient presents with   Fatigue    HPI Carl Woods is a 89 y.o. male presenting for chief complaint of generalized malaise and fatigue.  States he was recently admitted for similar symptoms found to have a anemia hemoglobin of 6.0.  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  Hemoglobin of 5 up trended to 10 and ultimately down to 8 throughout his hospitalization last week. Co. morbidly, he suffered from a cholangitis episode during this hospitalization he underwent ERCP during hospitalization last week. .   Patient's recorded medical, surgical, social, medication list and allergies were reviewed in the Snapshot window as part of the initial history.   Review of Systems   Review of Systems  Constitutional:  Negative for chills and fever.  HENT:  Negative for ear pain and sore throat.   Eyes:  Negative for pain and visual disturbance.  Respiratory:  Negative for cough and shortness of breath.   Cardiovascular:  Negative for chest pain and palpitations.  Gastrointestinal:  Positive for blood in stool. Negative for abdominal pain and vomiting.  Genitourinary:  Negative for dysuria and hematuria.  Musculoskeletal:  Negative for arthralgias and back pain.  Skin:  Negative for color change and rash.  Neurological:  Negative for seizures and syncope.  All other systems reviewed and are negative.   Physical Exam Updated Vital Signs BP (!) 117/96   Pulse 63   Temp 99 F (37.2 C) (Oral)   Resp (!) 25   Ht 5\' 8"  (1.727 m)   Wt 98 kg   SpO2 100%   BMI 32.85 kg/m  Physical Exam Vitals and nursing note reviewed.  Constitutional:      General: He is not in acute distress.    Appearance: He is well-developed.  HENT:     Head: Normocephalic and  atraumatic.  Eyes:     Conjunctiva/sclera: Conjunctivae normal.  Cardiovascular:     Rate and Rhythm: Normal rate and regular rhythm.     Heart sounds: No murmur heard. Pulmonary:     Effort: Pulmonary effort is normal. No respiratory distress.     Breath sounds: Normal breath sounds.  Abdominal:     Palpations: Abdomen is soft.     Tenderness: There is no abdominal tenderness.  Genitourinary:    Comments: Obvious melena on rectal exam. Musculoskeletal:        General: No swelling.     Cervical back: Neck supple.  Skin:    General: Skin is warm and dry.     Capillary Refill: Capillary refill takes less than 2 seconds.  Neurological:     Mental Status: He is alert.  Psychiatric:        Mood and Affect: Mood normal.      ED Course/ Medical Decision Making/ A&P Clinical Course as of 08/15/22 2351  Fri Aug 15, 2022  2122 Consult GI admit [CC]    Clinical Course User Index [CC] Glyn Ade, MD    Procedures Procedures   Medications Ordered in ED Medications  pantoprazole (PROTONIX) 80 mg /NS 100 mL IVPB (has no administration in time range)  pantoprozole (PROTONIX) 80 mg /NS 100 mL infusion (has no administration in time range)  pantoprazole (PROTONIX) injection 40 mg (has no administration in time range)  0.9 %  sodium chloride infusion (Manually program via Guardrails IV Fluids) (0 mLs Intravenous Stopped 08/15/22 2322)    Medical Decision Making:    Carl Woods is a 89 y.o. male who presented to the ED today with recurrent melena/anemia detailed above.    Complete initial physical exam performed, notably the patient  was hemodynamically stable no acute distress..      Reviewed and confirmed nursing documentation for past medical history, family history, social history.    Initial Assessment:   Patient is presenting with recurrent melena and anemia in the setting of recent upper GI bleed. Treated with pantoprazole, hemoglobin resulted with severe anemia in  the sixes.  Treated with 1 unit packed red blood cells with plan for reassessment of hemoglobin level.  Consulted [facility] as there is primary gastroenterologist for further recommendations. Given Protonix 40 mg IV Reassessment and Plan:   So-Hi recommended Protonix and admission to medicine. Based on these recommendations, patient arranged for admission to hospitalist for further diagnostic care and management.   Disposition:   Based on the above findings, I believe this patient is stable for admission.    Patient/family educated about specific findings on our evaluation and explained exact reasons for admission.  Patient/family educated about clinical situation and time was allowed to answer questions.   Admission team communicated with and agreed with need for admission. Patient admitted. Patient ready to move at this time.     Emergency Department Medication Summary:   Medications  pantoprazole (PROTONIX) 80 mg /NS 100 mL IVPB (has no administration in time range)  pantoprozole (PROTONIX) 80 mg /NS 100 mL infusion (has no administration in time range)  pantoprazole (PROTONIX) injection 40 mg (has no administration in time range)  0.9 %  sodium chloride infusion (Manually program via Guardrails IV Fluids) (0 mLs Intravenous Stopped 08/15/22 2322)     Clinical Impression:  1. Melena      Admit   Final Clinical Impression(s) / ED Diagnoses Final diagnoses:  Melena    Rx / DC Orders ED Discharge Orders     None         Glyn Ade, MD 08/15/22 2351

## 2022-08-15 NOTE — ED Notes (Signed)
EDP notified of critical lab value.   Hemoglobin 6.1

## 2022-08-16 DIAGNOSIS — K7460 Unspecified cirrhosis of liver: Secondary | ICD-10-CM

## 2022-08-16 DIAGNOSIS — E039 Hypothyroidism, unspecified: Secondary | ICD-10-CM | POA: Diagnosis not present

## 2022-08-16 DIAGNOSIS — I4821 Permanent atrial fibrillation: Secondary | ICD-10-CM | POA: Diagnosis not present

## 2022-08-16 DIAGNOSIS — K5521 Angiodysplasia of colon with hemorrhage: Secondary | ICD-10-CM | POA: Diagnosis not present

## 2022-08-16 DIAGNOSIS — I5032 Chronic diastolic (congestive) heart failure: Secondary | ICD-10-CM | POA: Diagnosis present

## 2022-08-16 DIAGNOSIS — I4891 Unspecified atrial fibrillation: Secondary | ICD-10-CM

## 2022-08-16 DIAGNOSIS — K921 Melena: Secondary | ICD-10-CM | POA: Diagnosis not present

## 2022-08-16 DIAGNOSIS — C241 Malignant neoplasm of ampulla of Vater: Secondary | ICD-10-CM

## 2022-08-16 DIAGNOSIS — D649 Anemia, unspecified: Secondary | ICD-10-CM | POA: Diagnosis not present

## 2022-08-16 DIAGNOSIS — D62 Acute posthemorrhagic anemia: Secondary | ICD-10-CM | POA: Diagnosis not present

## 2022-08-16 DIAGNOSIS — Z7901 Long term (current) use of anticoagulants: Secondary | ICD-10-CM | POA: Diagnosis not present

## 2022-08-16 DIAGNOSIS — N183 Chronic kidney disease, stage 3 unspecified: Secondary | ICD-10-CM | POA: Diagnosis not present

## 2022-08-16 LAB — BASIC METABOLIC PANEL
Anion gap: 9 (ref 5–15)
BUN: 30 mg/dL — ABNORMAL HIGH (ref 8–23)
CO2: 22 mmol/L (ref 22–32)
Calcium: 8.4 mg/dL — ABNORMAL LOW (ref 8.9–10.3)
Chloride: 109 mmol/L (ref 98–111)
Creatinine, Ser: 1.62 mg/dL — ABNORMAL HIGH (ref 0.61–1.24)
GFR, Estimated: 40 mL/min — ABNORMAL LOW (ref >60)
Glucose, Bld: 99 mg/dL (ref 70–99)
Potassium: 4.1 mmol/L (ref 3.5–5.1)
Sodium: 140 mmol/L (ref 135–145)

## 2022-08-16 LAB — HEMOGLOBIN AND HEMATOCRIT, BLOOD
HCT: 28 % — ABNORMAL LOW (ref 39.0–52.0)
HCT: 21.4 % — ABNORMAL LOW (ref 39.0–52.0)
HCT: 20.3 % — ABNORMAL LOW (ref 39.0–52.0)
HCT: 23.1 % — ABNORMAL LOW (ref 39.0–52.0)
Hemoglobin: 8.5 g/dL — ABNORMAL LOW (ref 13.0–17.0)
Hemoglobin: 6.7 g/dL — CL (ref 13.0–17.0)
Hemoglobin: 6.4 g/dL — CL (ref 13.0–17.0)
Hemoglobin: 7.5 g/dL — ABNORMAL LOW (ref 13.0–17.0)

## 2022-08-16 LAB — BPAM RBC
Blood Product Expiration Date: 202408132359
ISSUE DATE / TIME: 202407201614
Unit Type and Rh: 600

## 2022-08-16 LAB — PREPARE RBC (CROSSMATCH)

## 2022-08-16 LAB — TYPE AND SCREEN

## 2022-08-16 MED ORDER — ONDANSETRON HCL 4 MG/2ML IJ SOLN: 4.0000 mg | Freq: Four times a day (QID) | INTRAMUSCULAR | Status: DC | PRN

## 2022-08-16 MED ORDER — SALINE SPRAY 0.65 % NA SOLN: 1.0000 | NASAL | Status: DC | PRN

## 2022-08-16 MED ORDER — LEVOTHYROXINE SODIUM 75 MCG PO TABS
150.0000 ug | ORAL_TABLET | Freq: Every day | ORAL | Status: DC
  Administered 2022-08-16 – 2022-08-29 (×14): 150 ug via ORAL
  Filled 2022-08-16 (×14): qty 2

## 2022-08-16 MED ORDER — DARBEPOETIN ALFA 40 MCG/0.4ML IJ SOSY
40.0000 ug | PREFILLED_SYRINGE | Freq: Once | INTRAMUSCULAR | Status: AC
  Administered 2022-08-16: 40 ug via SUBCUTANEOUS
  Filled 2022-08-16: qty 0.4

## 2022-08-16 MED ORDER — ACETAMINOPHEN 325 MG PO TABS: 650.0000 mg | ORAL_TABLET | Freq: Four times a day (QID) | ORAL | Status: DC | PRN

## 2022-08-16 MED ORDER — SUCRALFATE 1 GM/10ML PO SUSP
1.0000 g | Freq: Three times a day (TID) | ORAL | Status: DC
  Administered 2022-08-16 – 2022-08-29 (×50): 1 g via ORAL
  Filled 2022-08-16 (×49): qty 10

## 2022-08-16 MED ORDER — ONDANSETRON HCL 4 MG PO TABS: 4.0000 mg | ORAL_TABLET | Freq: Four times a day (QID) | ORAL | Status: DC | PRN

## 2022-08-16 MED ORDER — OLOPATADINE HCL 0.1 % OP SOLN: 1.0000 [drp] | Freq: Every day | OPHTHALMIC | Status: DC | PRN

## 2022-08-16 MED ORDER — ACETAMINOPHEN 650 MG RE SUPP: 650.0000 mg | Freq: Four times a day (QID) | RECTAL | Status: DC | PRN

## 2022-08-16 MED ORDER — SODIUM CHLORIDE 0.9 % IV SOLN
250.0000 mg | Freq: Every day | INTRAVENOUS | Status: AC
  Administered 2022-08-17 (×2): 250 mg via INTRAVENOUS
  Filled 2022-08-16 (×2): qty 20

## 2022-08-16 MED ORDER — FERROUS SULFATE 325 (65 FE) MG PO TABS: 325.0000 mg | ORAL_TABLET | ORAL | Status: DC

## 2022-08-16 MED ORDER — SODIUM CHLORIDE 0.9% IV SOLUTION: Freq: Once | INTRAVENOUS | Status: AC

## 2022-08-16 MED ORDER — VITAMIN D 25 MCG (1000 UNIT) PO TABS
5000.0000 [IU] | ORAL_TABLET | Freq: Every morning | ORAL | Status: DC
  Administered 2022-08-16 – 2022-08-29 (×13): 5000 [IU] via ORAL
  Filled 2022-08-16 (×13): qty 5

## 2022-08-16 NOTE — Assessment & Plan Note (Signed)
Holding bumex and farxiga for the moment. No volume overload at the moment, but watch for this with transfusion(s).

## 2022-08-16 NOTE — H&P (Addendum)
History and Physical    Patient: Carl Woods MWU:[id number] DOB: [birth year] DOA: 08/15/2022 DOS: the patient was seen and examined on 08/16/2022 PCP: [name], MD  Patient coming from: SNF  Chief Complaint:  Chief Complaint  Patient presents with   Fatigue   HPI: Carl Woods is a 89 y.o. 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  Recent hospital stay in June for GIB due to prepyloric ulcer s/p clipping.  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  These were treated with APC.  Pt also had bilary stent placed during that admission on 7/10.  Hemoglobin 5 on admission underwent 2 units of PRBC improved to 10.2, decreased to 7.8 and ultimately stabilized for a couple of days.  Was 8.4 on day of discharge 7/12.  Pt in to ED today with fatigue.  HGB at facility was reportedly 5.4  In ED: HGB 6.1  Pt transfused 1u PRBC    Review of Systems: As mentioned in the history of present illness. All other systems reviewed and are negative. 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   Left Forehead (treatment after biopsy)   OSA on CPAP    Perianal rash    Recurrent epistaxis    Renal cyst  01/04/2013   Small left peripelvic renal cysts , noted on US Renal   SCCA (squamous cell carcinoma) of skin 10/17/2015   Left Sup Bridge of Nose (curet, cautery and 5FU)   Seasonal allergies    Superficial basal cell carcinoma (BCC) 10/17/2015   Left Bulb of Nose (curet, cautery and 5FU)   Tubular adenoma    Past Surgical History:  Procedure Laterality Date   BILIARY STENT PLACEMENT N/A 01/23/2022   Procedure: BILIARY STENT PLACEMENT;  Surgeon: Lemar Lofty., MD;  Location: [facility];  Service: Gastroenterology;  Laterality: N/A;   BILIARY STENT PLACEMENT N/A 08/06/2022   Procedure: BILIARY STENT PLACEMENT;  Surgeon: Lynann Bologna, MD;  Location: [facility];  Service: Gastroenterology;  Laterality: N/A;   BIOPSY  01/23/2022   Procedure: BIOPSY;  Surgeon: Meridee Score Netty Starring., MD;  Location: [facility];  Service: Gastroenterology;;   BIOPSY  07/09/2022   Procedure: BIOPSY;  Surgeon: Shellia Cleverly, DO;  Location: [facility];  Service: Gastroenterology;;   BIOPSY  08/02/2022   Procedure: BIOPSY;  Surgeon: Sherrilyn Rist, MD;  Location: [facility];  Service: Gastroenterology;;   COLONOSCOPY  multiple   CYSTOSCOPY     ENDOSCOPIC RETROGRADE CHOLANGIOPANCREATOGRAPHY (ERCP) WITH PROPOFOL N/A 01/23/2022   Procedure: ENDOSCOPIC RETROGRADE CHOLANGIOPANCREATOGRAPHY (ERCP) WITH PROPOFOL;  Surgeon: Lemar Lofty., MD;  Location: [facility];  Service: Gastroenterology;  Laterality: N/A;  ENTEROSCOPY N/A 08/02/2022   Procedure: ENTEROSCOPY;  Surgeon: Sherrilyn Rist, MD;  Location: [facility];  Service: Gastroenterology;  Laterality: N/A;   ERCP N/A 08/06/2022   Procedure: ENDOSCOPIC RETROGRADE CHOLANGIOPANCREATOGRAPHY (ERCP);  Surgeon: Lynann Bologna, MD;  Location: [facility];  Service: Gastroenterology;  Laterality: N/A;   ESOPHAGOGASTRODUODENOSCOPY  multiple   ESOPHAGOGASTRODUODENOSCOPY N/A 01/23/2022   Procedure: ESOPHAGOGASTRODUODENOSCOPY (EGD);   Surgeon: Lemar Lofty., MD;  Location: [facility];  Service: Gastroenterology;  Laterality: N/A;   ESOPHAGOGASTRODUODENOSCOPY N/A 07/09/2022   Procedure: ESOPHAGOGASTRODUODENOSCOPY (EGD);  Surgeon: Shellia Cleverly, DO;  Location: [facility];  Service: Gastroenterology;  Laterality: N/A;   EUS N/A 01/23/2022   Procedure: UPPER ENDOSCOPIC ULTRASOUND (EUS) RADIAL;  Surgeon: Lemar Lofty., MD;  Location: [facility];  Service: Gastroenterology;  Laterality: N/A;   EVALUATION UNDER ANESTHESIA WITH FISTULECTOMY N/A 03/02/2018   Procedure: ANORECTAL EXAM UNDER ANESTHESIA WITH REPAIR OF SUPERFICIAL PERIRECTAL FISTULA AND HEMORRHOIDECTOMY;  Surgeon: [name], MD;  Location: [facility];  Service: General;  Laterality: N/A;   EXPLORATORY LAPAROTOMY     HEMOSTASIS CLIP PLACEMENT  07/09/2022   Procedure: HEMOSTASIS CLIP PLACEMENT;  Surgeon: Shellia Cleverly, DO;  Location: [facility];  Service: Gastroenterology;;   HOT HEMOSTASIS N/A 08/02/2022   Procedure: HOT HEMOSTASIS (ARGON PLASMA COAGULATION/BICAP);  Surgeon: Sherrilyn Rist, MD;  Location: [facility];  Service: Gastroenterology;  Laterality: N/A;   IR THORACENTESIS ASP PLEURAL SPACE W/IMG GUIDE  07/10/2022   REMOVAL OF STONES  01/23/2022   Procedure: REMOVAL OF STONES;  Surgeon: Lemar Lofty., MD;  Location: [facility];  Service: Gastroenterology;;   REMOVAL OF STONES  08/06/2022   Procedure: REMOVAL OF STONES;  Surgeon: Lynann Bologna, MD;  Location: [facility];  Service: Gastroenterology;;   Dennison Mascot  01/23/2022   Procedure: Dennison Mascot;  Surgeon: Mansouraty, Netty Starring., MD;  Location: [facility];  Service: Gastroenterology;;   Social History:  reports that he quit smoking about 18 years ago. His smoking use included cigarettes. He started smoking about 58 years ago. He has a 20 pack-year smoking history. He has never used smokeless tobacco. He reports current alcohol use. He reports that he does  not use drugs.  Allergies  Allergen Reactions   Nsaids Other (See Comments)    Patient is to not take these because of kidney issues    Family History  Problem Relation Age of Onset   Heart disease Mother    Other Father        old age   Ovarian cancer Sister    Colon cancer Neg Hx    Stomach cancer Neg Hx    Rectal cancer Neg Hx    Esophageal cancer Neg Hx    Liver cancer Neg Hx     Prior to Admission medications   Medication Sig Start Date End Date Taking? Authorizing Provider  bumetanide (BUMEX) 1 MG tablet Take 1 tablet (1 mg total) by mouth daily. 07/11/22  Yes Burnadette Pop, MD  Cholecalciferol (VITAMIN D3) 5000 units CAPS Take 5,000 Units by mouth every morning.   Yes [provider]  dapagliflozin propanediol (FARXIGA) 10 MG TABS tablet Take 10 mg by mouth in the morning. 05/29/20  Yes [provider]  ferrous sulfate 325 (65 FE) MG tablet Take 325 mg by mouth every Monday, Wednesday, and Friday.   Yes [provider]  Glucosamine Sulfate 500 MG TABS Take 500 mg by mouth daily. 07/08/16  Yes [provider]  levothyroxine (SYNTHROID) 150 MCG  tablet Take 150 mcg by mouth daily before breakfast. 02/11/16  Yes [provider]  olopatadine (PATANOL) 0.1 % ophthalmic solution Place 1 drop into both eyes daily as needed for allergies. 07/08/16  Yes [provider]  pantoprazole (PROTONIX) 40 MG tablet Take 1 tablet (40 mg total) by mouth 2 (two) times daily. 07/11/22  Yes Burnadette Pop, MD  potassium chloride SA (KLOR-CON M) 20 MEQ tablet Take 1 tablet (20 mEq total) by mouth 2 (two) times daily. 07/30/22  Yes Malachy Mood, MD  sodium chloride (OCEAN) 0.65 % SOLN nasal spray Place 1 spray into both nostrils as needed for congestion.   Yes [provider]  sucralfate (CARAFATE) 1 GM/10ML suspension Take 10 mLs (1 g total) by mouth 4 (four) times daily -  with meals and at bedtime. 07/11/22  Yes Burnadette Pop, MD  nitroGLYCERIN  (NITROSTAT) 0.4 MG SL tablet Place 1 tablet (0.4 mg total) under the tongue every 5 (five) minutes as needed for up to 25 days for chest pain. 01/31/20 01/18/24  [name], MD    Physical Exam: Vitals:   08/15/22 2322 08/16/22 0015 08/16/22 0030 08/16/22 0100  BP: (!) 117/96 (!) 94/48 (!) 105/48 (!) 104/52  Pulse: 63 79 63 66  Resp: (!) 25 18 16 19   Temp:   97.9 F (36.6 C)   TempSrc:   Oral   SpO2: 100% 100% 100% 100%  Weight:      Height:       Constitutional: NAD, calm, comfortable Respiratory: clear to auscultation bilaterally, no wheezing, no crackles. Normal respiratory effort. No accessory muscle use.  Cardiovascular: IRR, IRR, no murmurs / rubs / gallops. No extremity edema. 2+ pedal pulses. No carotid bruits.  Abdomen: no tenderness, no masses palpated. No hepatosplenomegaly. Bowel sounds positive.  Neurologic: CN 2-12 grossly intact. Sensation intact, DTR normal. Strength 5/5 in all 4.  Psychiatric: Normal judgment and insight.  Data Reviewed:    Labs on Admission: I have personally reviewed following labs and imaging studies  CBC: Recent Labs  Lab 08/15/22 1925  WBC 4.3  NEUTROABS 2.6  HGB 6.1*  HCT 20.1*  MCV 108.6*  PLT 135*   Basic Metabolic Panel: Recent Labs  Lab 08/15/22 1925  NA 138  K 4.1  CL 107  CO2 22  GLUCOSE 117*  BUN 32*  CREATININE 1.70*  CALCIUM 8.7*   GFR: Estimated Creatinine Clearance: 33.4 mL/min (A) (by C-G formula based on SCr of 1.7 mg/dL (H)). Liver Function Tests: No results for input(s): "AST", "ALT", "ALKPHOS", "BILITOT", "PROT", "ALBUMIN" in the last 168 hours. Recent Labs  Lab 08/15/22 1925  LIPASE 27   No results for input(s): "AMMONIA" in the last 168 hours. Coagulation Profile: No results for input(s): "INR", "PROTIME" in the last 168 hours. Cardiac Enzymes: No results for input(s): "CKTOTAL", "CKMB", "CKMBINDEX", "TROPONINI" in the last 168 hours. BNP (last 3 results) No results for input(s): "PROBNP" in the  last 8760 hours. HbA1C: No results for input(s): "HGBA1C" in the last 72 hours. CBG: No results for input(s): "GLUCAP" in the last 168 hours. Lipid Profile: No results for input(s): "CHOL", "HDL", "LDLCALC", "TRIG", "CHOLHDL", "LDLDIRECT" in the last 72 hours. Thyroid Function Tests: No results for input(s): "TSH", "T4TOTAL", "FREET4", "T3FREE", "THYROIDAB" in the last 72 hours. Anemia Panel: No results for input(s): "VITAMINB12", "FOLATE", "FERRITIN", "TIBC", "IRON", "RETICCTPCT" in the last 72 hours. Urine analysis:    Component Value Date/Time   COLORURINE YELLOW 08/01/2022 1652   APPEARANCEUR CLEAR  08/01/2022 1652   LABSPEC 1.009 08/01/2022 1652   PHURINE 5.0 08/01/2022 1652   GLUCOSEU 150 (A) 08/01/2022 1652   HGBUR NEGATIVE 08/01/2022 1652   BILIRUBINUR NEGATIVE 08/01/2022 1652   KETONESUR NEGATIVE 08/01/2022 1652   PROTEINUR NEGATIVE 08/01/2022 1652   NITRITE NEGATIVE 08/01/2022 1652   LEUKOCYTESUR NEGATIVE 08/01/2022 1652    Radiological Exams on Admission: DG Chest Portable 1 View  Result Date: 08/15/2022 CLINICAL DATA:  Altered mental status EXAM: PORTABLE CHEST 1 VIEW COMPARISON:  Chest x-ray 07/10/2022.  CT of the chest 02/07/2022. FINDINGS: Heart is mildly enlarged. There some interstitial opacities in both lung bases with a small left pleural effusion. Left pleural calcifications are again noted. There is no pneumothorax or acute fracture. IMPRESSION: Mild cardiomegaly with interstitial opacities in both lung bases and a small left pleural effusion. Findings may represent mild pulmonary edema or atypical infection. Electronically Signed   By: Darliss Cheney M.D.   On: 08/15/2022 19:35    EKG: Independently reviewed.   Assessment and Plan: * Gastrointestinal hemorrhage with melena Pt with recurrent GIBs due to "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" As described  by Dr. Myrtie Neither in his 7/7 consult note. Just admitted for same 2 weeks ago.  Now back in to ED with recurrent ABLA and melena. Looks like goals of care discussions held with family during last admit, but ultimately family decided that pt be a full code and have full scope of care (see Dr. Patterson Hammersmith 7/8 note). NPO Transfusion as discussed in ABLA below Tele monitor PPI GTT EDP discussed with [facility] who will see in consult  Acute blood loss anemia Presumably due to ongoing / recurrent GIB from AVMs most likely.  Recent h/o same just 2 weeks ago. 1u PRBC transfusion in ED H/H now and Q6H ordered May need further transfusions.  Cancer of ampulla of Vater (HCC) ERCP with stent placed on 7/10.  CKD (chronic kidney disease) stage 3, GFR 30-59 ml/min (HCC) Creat today of 1.7 looks to be similar to his baseline.  Hypothyroidism Cont synthroid  Atrial fibrillation (HCC) Appears to be in rate controlled a.fib today. Not on AC at this time, presumably related to recent GIBs including just 2 weeks ago.      Advance Care Planning:   Code Status: Full Code Full code at this time, see also Dr. Patterson Hammersmith note from 7/8, family discussions during last admit (was made DNR but then family reversed course and wanted him full code).  Consults: EDP d/w [facility]  Family Communication: No family in room  Severity of Illness: The appropriate patient status for this patient is OBSERVATION. Observation status is judged to be reasonable and necessary in order to provide the required intensity of service to ensure the patient's safety. The patient's presenting symptoms, physical exam findings, and initial radiographic and laboratory data in the context of their medical condition is felt to place them at decreased risk for further clinical deterioration. Furthermore, it is anticipated that the patient will be medically stable for discharge from the hospital within 2 midnights of admission.   Author: Hillary Bow.,  DO 08/16/2022 3:27 AM  For on call review [email or web address].

## 2022-08-16 NOTE — Assessment & Plan Note (Signed)
Creat today of 1.7 looks to be similar to his baseline.

## 2022-08-16 NOTE — Consult Note (Addendum)
Referring Provider: Dr. Lyda Perone Primary Care Physician:  [name], MD Primary Gastroenterologist:  Dr. Leone Payor  Reason for Consultation:  Melena, anemia   HPI: Carl Woods is a 89 y.o. 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  He was previously admitted to the hospital 07/06/2022 secondary to symptomatic anemia. Hg 5. Transfused 4  units of PRBCs. EGD 07/09/2022 during that admission showed evidence of a gastric ulcer with visible vessel which was treated as wells as evidence of radiation gastritis. Treated with PPI bid. Carafate qid x 4 weeks. ERCP for CBD stent exchange planned in one month. CTAP 06/2022 showed evidence of cirrhosis, out patient follow up recommended. He was found to have bilateral pleural effusions s/p thoracentesis, 1.2 L pleural fluid removed. He was discharged to SNF on 07/11/2022.   Readmitted to the hospital 08/01/2022 due to progressive fatigue, altered mental status and melena. Admission Hg 5.0. Transfused a total of 2 units of PRBCs. Head CT was negative. EGD 08/02/2022 showed gastric antral vascular ectasia with bleeding s/p APC, single nonbleeding angioectasia in the jejunum treated with APC. The prior plastic CBC stent was no longer visualized and prolonged observation of the ampullary area revealed passage of purulent bile concerning for cholangitis. He underwent an ERCP 08/06/2022 and a metal stent was placed in the CBC and GAVE gastroduodenopathy was noted. Hg 8.4 at time of discharge on 08/08/2022.  Prescribed pantoprazole 40 mg p.o. twice daily and sucralfate 1 g p.o. 4 times daily.  Patient was readmitted to the hospital 08/15/2022 with progressive fatigue and generalized weakness.   Patient stated his hemoglobin level was low and he was sent from SNF to the ED for further evaluation.  Labs in the ED showed a Hg level of 6.1 down from 8.4 on 08/08/2022. HCT 20.1. MCV 108.6. PLT 135. Cr 1.70 up from 1.41. BUN 32. Lipase 27. Transfused one unit of PRBCs -> Hg 8.5.  He denies having any nausea or vomiting.  No upper or lower abdominal pain.  He is passing 1 black soft to loose stool daily since he was previously discharged from the hospital.  No bright red rectal bleeding.  No chest pain or shortness of breath.  Denies prior alcohol use.  No family at the bedside.  GI PROCEDURES:  ERCP 08/06/2022: - Periampullary CA s/p uncovered 10Fr x 6 cm metal stent insertion.  - GAVE like XRT gastroduodenopathy.  EGD 08/02/2022: - Normal esophagus.  - Gastric antral vascular ectasia with bleeding. Treated with argon plasma coagulation (APC).  - Mucosal changes in the duodenum.  - Mucosal changes in the duodenum. Biopsied.  - A single non-bleeding angioectasia in the jejunum. Treated with argon plasma coagulation (APC). Probable cholangitis (see findings above) plastic biliary stent no longer in position. These gastric and proximal small bowel findings related to radiation treatment and underlying cirrhosis unfortunately have no permanent endoscopic solution, and there is a high chance of ongoing GI bleeding. Imaging suggest there may be some inflammation and thickening in the colon consistent with portal colopathy. Given the above findings, this patient could be bleeding from multiple possible sources nearly anywhere in his GI tract. His mental status and hemodynamic lability preclude preparation for and performing a colonoscopy  EGD 07/09/2022: - Normal esophagus.  - Z-line  regular, 45 cm from the incisors.  - A few gastric polyps.  - Erythematous mucosa in the prepyloric region of the stomach. Endoscopically, this appeared suspicious for radiation gastritis. Biopsied.  - Non-bleeding gastric  ulcer with a visible vessel. Clip (MR conditional) was placed.  - Erythematous duodenopathy.  - Duodenal foreign body.  - Normal second portion of the duodenum and third portion of the duodenum - Use Protonix (pantoprazole) 40 mg PO BID. - Use sucralfate suspension 1 gram PO QID for 4 weeks. - Plan for ERCP with stent exchange next month as scheduled. Will evaluate for mucosal healing at that time  ERCP 01/23/2022: - The major papilla appeared to be ulcerated, prominent, with a larger intraduodenal portion. - The entire main bile duct was severely dilated.  - A biliary sphincterotomy was performed.  - The biliary tree was swept and sludge was found.  - Biopsy was performed of the ampulla after sphincterotomy to get deeper tissue  - One plastic biliary stent was placed into the common bile duct. - Repeat ERCP in 4 months to exchange stent. Earlier follow up can be considered based on overall pathology and findings. If malignancy is found, then more permanent stenting may need to be considered  EGD/EUS 01/24/2023: EGD Impression:  - No gross lesions in the entire esophagus. Z-line irregular, 45 cm from the incisors.  - Multiple gastric polyps  - fundic gland in appearance.  - Gastritis. Biopsied.  - Nodular mucosa in the duodenal bulb. No other gross lesions in the second portion of the duodenum. - Duodenal ampullary deformity (large intraduodenal portion). - Submucosal ulcerated nodule at the ampulla. Biopsied after EUS.  EUS Impression:  - There was dilation in the common bile duct and in the common hepatic duct.  - Small amount of hyperechoic material consistent with sludge was visualized endosonographically in the common bile duct.  - Hyperechoic material consistent with sludge was visualized endosonographically in the gallbladder. - Pancreatic parenchymal abnormalities consisting of hyperechoic foci and cysts were noted in the entire pancreas  - The pancreatic duct had a dilated  endosonographic appearance, had a prominently branched endosonographic appearance and had hyperechoic walls in the pancreatic head, genu of the pancreas, body of the pancreas and tail of the pancreas.  - A lesion was found at the ampulla. Tissue has not been obtained. However, the endosonographic appearance is suspicious for malignancy (as noted above tissue samples were taken from the nodule - also see ERCP report for additional sampling of this area).  - No malignant-appearing lymph nodes were visualized in the celiac region (level 20), peripancreatic region and porta hepatis region.  PRIOR IMAGE STUDIES:  6/24 CTAbdomen IMPRESSION: 1. Hepatic cirrhosis. No focal lesions. The contrast bolus is somewhat limited but no obvious portal venous thrombosis is demonstrated in the central portal veins appear to have flow. 2. Gallbladder wall edema is likely due to liver disease. Bile duct stent with pneumobilia. 3. 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 4. Diffuse edema demonstrated in the subcutaneous fat, mesentery, and intra peritoneum. 5. Aortic atherosclerosis. 6. Small supraumbilical hernia with fat herniation and fat necrosis. 7. Large bilateral pleural effusions with basilar atelectasis or consolidation.   7/24 CTHead IMPRESSION: No acute abnormality noted.   Past Medical History:  Diagnosis Date   Anal fistula    Arthritis    Fingers and hands   Atrial fibrillation (HCC)    AVM (arteriovenous malformation)    Clipped  during Colonoscopy 05/2016   Barrett's esophagus    Bilateral cataracts    BPH (benign prostatic hyperplasia)    Cecal angiodysplasia 05/28/2016   ablated at colonoscopy   Deviated nasal septum    Diverticulosis of sigmoid colon    E. coli infection    Fatty liver    GERD (gastroesophageal reflux disease)    History of colon polyps    Hypothyroidism    Iron deficiency anemia     Nodular basal cell carcinoma (BCC) 11/05/2017   Left Forehead (treatment after biopsy)   OSA on CPAP    Perianal rash    Recurrent epistaxis    Renal cyst 01/04/2013   Small left peripelvic renal cysts , noted on US Renal   SCCA (squamous cell carcinoma) of skin 10/17/2015   Left Sup Bridge of Nose (curet, cautery and 5FU)   Seasonal allergies    Superficial basal cell carcinoma (BCC) 10/17/2015   Left Bulb of Nose (curet, cautery and 5FU)   Tubular adenoma     Past Surgical History:  Procedure Laterality Date   BILIARY STENT PLACEMENT N/A 01/23/2022   Procedure: BILIARY STENT PLACEMENT;  Surgeon: Lemar Lofty., MD;  Location: [facility];  Service: Gastroenterology;  Laterality: N/A;   BILIARY STENT PLACEMENT N/A 08/06/2022   Procedure: BILIARY STENT PLACEMENT;  Surgeon: Lynann Bologna, MD;  Location: [facility];  Service: Gastroenterology;  Laterality: N/A;   BIOPSY  01/23/2022   Procedure: BIOPSY;  Surgeon: Meridee Score Netty Starring., MD;  Location: [facility];  Service: Gastroenterology;;   BIOPSY  07/09/2022   Procedure: BIOPSY;  Surgeon: Shellia Cleverly, DO;  Location: [facility];  Service: Gastroenterology;;   BIOPSY  08/02/2022   Procedure: BIOPSY;  Surgeon: Sherrilyn Rist, MD;  Location: [facility];  Service: Gastroenterology;;   COLONOSCOPY  multiple   CYSTOSCOPY     ENDOSCOPIC RETROGRADE CHOLANGIOPANCREATOGRAPHY (ERCP) WITH PROPOFOL N/A 01/23/2022   Procedure: ENDOSCOPIC RETROGRADE CHOLANGIOPANCREATOGRAPHY (ERCP) WITH PROPOFOL;  Surgeon: Lemar Lofty., MD;  Location: [facility];  Service: Gastroenterology;  Laterality: N/A;   ENTEROSCOPY N/A 08/02/2022   Procedure: ENTEROSCOPY;  Surgeon: Sherrilyn Rist, MD;  Location: [facility];  Service: Gastroenterology;  Laterality: N/A;   ERCP N/A 08/06/2022   Procedure: ENDOSCOPIC RETROGRADE CHOLANGIOPANCREATOGRAPHY (ERCP);  Surgeon: Lynann Bologna, MD;  Location: [facility];  Service:  Gastroenterology;  Laterality: N/A;   ESOPHAGOGASTRODUODENOSCOPY  multiple   ESOPHAGOGASTRODUODENOSCOPY N/A 01/23/2022   Procedure: ESOPHAGOGASTRODUODENOSCOPY (EGD);  Surgeon: Lemar Lofty., MD;  Location: [facility];  Service: Gastroenterology;  Laterality: N/A;   ESOPHAGOGASTRODUODENOSCOPY N/A 07/09/2022   Procedure: ESOPHAGOGASTRODUODENOSCOPY (EGD);  Surgeon: Shellia Cleverly, DO;  Location[facility];  Service: Gastroenterology;  Laterality: N/A;   EUS N/A 01/23/2022   Procedure: UPPER ENDOSCOPIC ULTRASOUND (EUS) RADIAL;  Surgeon: Lemar Lofty., MD;  Location: [facility];  Service: Gastroenterology;  Laterality: N/A;   EVALUATION UNDER ANESTHESIA WITH FISTULECTOMY N/A 03/02/2018   Procedure: ANORECTAL EXAM UNDER ANESTHESIA WITH REPAIR OF SUPERFICIAL PERIRECTAL FISTULA AND HEMORRHOIDECTOMY;  Surgeon: [name], MD;  Location: [facility];  Service: General;  Laterality: N/A;   EXPLORATORY LAPAROTOMY     HEMOSTASIS CLIP PLACEMENT  07/09/2022   Procedure: HEMOSTASIS CLIP PLACEMENT;  Surgeon: Shellia Cleverly, DO;  Location: [facility];  Service: Gastroenterology;;   HOT HEMOSTASIS N/A 08/02/2022   Procedure: HOT HEMOSTASIS (ARGON PLASMA COAGULATION/BICAP);  Surgeon: Sherrilyn Rist, MD;  Location: [facility];  Service: Gastroenterology;  Laterality: N/A;   IR THORACENTESIS  ASP PLEURAL SPACE W/IMG GUIDE  07/10/2022   REMOVAL OF STONES  01/23/2022   Procedure: REMOVAL OF STONES;  Surgeon: Meridee Score Netty Starring., MD;  Location: [facility];  Service: Gastroenterology;;   REMOVAL OF STONES  08/06/2022   Procedure: REMOVAL OF STONES;  Surgeon: Lynann Bologna, MD;  Location: [facility];  Service: Gastroenterology;;   Dennison Mascot  01/23/2022   Procedure: Dennison Mascot;  Surgeon: Lemar Lofty., MD;  Location: [facility];  Service: Gastroenterology;;    Prior to Admission medications   Medication Sig Start Date End Date Taking? Authorizing Provider   bumetanide (BUMEX) 1 MG tablet Take 1 tablet (1 mg total) by mouth daily. 07/11/22  Yes Burnadette Pop, MD  Cholecalciferol (VITAMIN D3) 5000 units CAPS Take 5,000 Units by mouth every morning.   Yes [provider]  dapagliflozin propanediol (FARXIGA) 10 MG TABS tablet Take 10 mg by mouth in the morning. 05/29/20  Yes [provider]  ferrous sulfate 325 (65 FE) MG tablet Take 325 mg by mouth every Monday, Wednesday, and Friday.   Yes [provider]  Glucosamine Sulfate 500 MG TABS Take 500 mg by mouth daily. 07/08/16  Yes [provider]  levothyroxine (SYNTHROID) 150 MCG tablet Take 150 mcg by mouth daily before breakfast. 02/11/16  Yes [provider]  olopatadine (PATANOL) 0.1 % ophthalmic solution Place 1 drop into both eyes daily as needed for allergies. 07/08/16  Yes [provider]  pantoprazole (PROTONIX) 40 MG tablet Take 1 tablet (40 mg total) by mouth 2 (two) times daily. 07/11/22  Yes Burnadette Pop, MD  potassium chloride SA (KLOR-CON M) 20 MEQ tablet Take 1 tablet (20 mEq total) by mouth 2 (two) times daily. 07/30/22  Yes Malachy Mood, MD  sodium chloride (OCEAN) 0.65 % SOLN nasal spray Place 1 spray into both nostrils as needed for congestion.   Yes [provider]  sucralfate (CARAFATE) 1 GM/10ML suspension Take 10 mLs (1 g total) by mouth 4 (four) times daily -  with meals and at bedtime. 07/11/22  Yes Burnadette Pop, MD  nitroGLYCERIN (NITROSTAT) 0.4 MG SL tablet Place 1 tablet (0.4 mg total) under the tongue every 5 (five) minutes as needed for up to 25 days for chest pain. 01/31/20 01/18/24  [name], MD    Current Facility-Administered Medications  Medication Dose Route Frequency Provider Last Rate Last Admin   acetaminophen (TYLENOL) tablet 650 mg  650 mg Oral Q6H PRN Hillary Bow, DO       Or   acetaminophen (TYLENOL) suppository 650 mg  650 mg Rectal Q6H PRN Hillary Bow, DO       cholecalciferol (VITAMIN D3)  25 MCG (1000 UNIT) tablet 5,000 Units  5,000 Units Oral q morning Hillary Bow, DO       [START ON 08/18/2022] ferrous sulfate tablet 325 mg  325 mg Oral Q M,W,F Julian Reil, Jared M, DO       levothyroxine (SYNTHROID) tablet 150 mcg  150 mcg Oral QAC breakfast Hillary Bow, DO   150 mcg at 08/16/22 0554   olopatadine (PATANOL) 0.1 % ophthalmic solution 1 drop  1 drop Both Eyes Daily PRN Hillary Bow, DO       ondansetron [facility]) tablet 4 mg  4 mg Oral Q6H PRN Hillary Bow, DO       Or   ondansetron [facility]) injection 4 mg  4 mg Intravenous Q6H PRN Hillary Bow, DO       Melene Muller  ON 08/19/2022] pantoprazole (PROTONIX) injection 40 mg  40 mg Intravenous Q12H Lyda Perone M, DO       pantoprozole (PROTONIX) 80 mg /NS 100 mL infusion  8 mg/hr Intravenous Continuous Hillary Bow, DO 10 mL/hr at 08/16/22 0108 8 mg/hr at 08/16/22 0108   sodium chloride (OCEAN) 0.65 % nasal spray 1 spray  1 spray Each Nare PRN Hillary Bow, DO       sucralfate (CARAFATE) 1 GM/10ML suspension 1 g  1 g Oral TID WC & HS Hillary Bow, DO       Current Outpatient Medications  Medication Sig Dispense Refill   bumetanide (BUMEX) 1 MG tablet Take 1 tablet (1 mg total) by mouth daily.     Cholecalciferol (VITAMIN D3) 5000 units CAPS Take 5,000 Units by mouth every morning.     dapagliflozin propanediol (FARXIGA) 10 MG TABS tablet Take 10 mg by mouth in the morning.     ferrous sulfate 325 (65 FE) MG tablet Take 325 mg by mouth every Monday, Wednesday, and Friday.     Glucosamine Sulfate 500 MG TABS Take 500 mg by mouth daily.     levothyroxine (SYNTHROID) 150 MCG tablet Take 150 mcg by mouth daily before breakfast.     olopatadine (PATANOL) 0.1 % ophthalmic solution Place 1 drop into both eyes daily as needed for allergies.     pantoprazole (PROTONIX) 40 MG tablet Take 1 tablet (40 mg total) by mouth 2 (two) times daily.     potassium chloride SA (KLOR-CON M) 20 MEQ tablet Take 1 tablet (20 mEq  total) by mouth 2 (two) times daily. 60 tablet 0   sodium chloride (OCEAN) 0.65 % SOLN nasal spray Place 1 spray into both nostrils as needed for congestion.     sucralfate (CARAFATE) 1 GM/10ML suspension Take 10 mLs (1 g total) by mouth 4 (four) times daily -  with meals and at bedtime. 420 mL 0   nitroGLYCERIN (NITROSTAT) 0.4 MG SL tablet Place 1 tablet (0.4 mg total) under the tongue every 5 (five) minutes as needed for up to 25 days for chest pain. 25 tablet 3    Allergies as of 08/15/2022 - Review Complete 08/15/2022  Allergen Reaction Noted   Nsaids Other (See Comments) 08/01/2022    Family History  Problem Relation Age of Onset   Heart disease Mother    Other Father        old age   Ovarian cancer Sister    Colon cancer Neg Hx    Stomach cancer Neg Hx    Rectal cancer Neg Hx    Esophageal cancer Neg Hx    Liver cancer Neg Hx     Social History   Socioeconomic History   Marital status: Married    Spouse name: Not on file   Number of children: 3   Years of education: Not on file   Highest education level: Not on file  Occupational History   Occupation: retired  Tobacco Use   Smoking status: Former    Current packs/day: 0.00    Average packs/day: 0.5 packs/day for 40.0 years (20.0 ttl pk-yrs)    Types: Cigarettes    Start date: 1966    Quit date: 2006    Years since quitting: 18.5   Smokeless tobacco: Never  Vaping Use   Vaping status: Never Used  Substance and Sexual Activity   Alcohol use: Yes    Comment: occasional wine   Drug use: No  Sexual activity: Not on file  Other Topics Concern   Not on file  Social History Narrative   Not on file   Social Determinants of Health   Financial Resource Strain: Not on file  Food Insecurity: No Food Insecurity (07/08/2022)   Hunger Vital Sign    Worried About Running Out of Food in the Last Year: Never true    Ran Out of Food in the Last Year: Never true  Transportation Needs: Patient Declined (07/08/2022)    PRAPARE - Administrator, Civil Service (Medical): Patient declined    Lack of Transportation (Non-Medical): Patient declined  Physical Activity: Not on file  Stress: Not on file  Social Connections: Not on file  Intimate Partner Violence: Patient Declined (07/08/2022)   Humiliation, Afraid, Rape, and Kick questionnaire    Fear of Current or Ex-Partner: Patient declined    Emotionally Abused: Patient declined    Physically Abused: Patient declined    Sexually Abused: Patient declined    Review of Systems: Gen: + Fatigue. Denies fever, sweats or chills. No weight loss.  CV: Denies chest pain, palpitations or edema. Resp: Denies cough, shortness of breath of hemoptysis.  GI: See HPI. GU : Denies urinary burning, blood in urine, increased urinary frequency or incontinence. MS: Denies joint pain, muscles aches or weakness. Derm: Denies rash, itchiness, skin lesions or unhealing ulcers. Psych: Denies depression, anxiety, memory loss or confusion. Heme: Denies easy bruising, bleeding. Neuro:  Denies headaches, dizziness or paresthesias. Endo:  Denies any problems with DM, thyroid or adrenal function.  Physical Exam: Vital signs in last 24 hours: Temp:  [97.9 F (36.6 C)-99 F (37.2 C)] 98 F (36.7 C) (07/20 0334) Pulse Rate:  [53-84] 53 (07/20 0600) Resp:  [15-25] 18 (07/20 0600) BP: (94-117)/(48-96) 105/50 (07/20 0600) SpO2:  [100 %] 100 % (07/20 0600) Weight:  [98 kg] 98 kg (07/19 1844)   General: Alert 89 year old male fatigued-appearing in no acute distress. Head:  Normocephalic and atraumatic. Eyes:  No scleral icterus. Conjunctiva pink. Ears:  Normal auditory acuity. Nose:  No deformity, discharge or lesions. Mouth: Full upper dentures, partial lower dentures.  No ulcers or lesions.  Neck:  Supple. No lymphadenopathy or thyromegaly.  Lungs: Breath sounds clear throughout. No wheezes, rhonchi or crackles.  Heart: Irregular rhythm.  No murmurs. Abdomen: Soft,  nondistended.  Nontender.  Positive bowel sounds to all 4 quadrants.  Small umbilical hernia.  No palpable mass.  No hepatosplenomegaly.  No bruit.  Rectal: Deferred. Musculoskeletal:  Symmetrical without gross deformities.  Pulses:  Normal pulses noted. Extremities:  Without clubbing or edema. Neurologic:  Alert and  oriented x 4.  Speech is clear.  Moves all extremities equally.  Skin:  Intact without significant lesions or rashes. Psych:  Alert and cooperative. Normal mood and affect.  Intake/Output from previous day: 07/19 0701 - 07/20 0700 In: -  Out: 375 [Urine:375] Intake/Output this shift: No intake/output data recorded.  Lab Results: Recent Labs    08/15/22 1925 08/16/22 0315  WBC 4.3  --   HGB 6.1* 8.5*  HCT 20.1* 28.0*  PLT 135*  --    BMET Recent Labs    08/15/22 1925  NA 138  K 4.1  CL 107  CO2 22  GLUCOSE 117*  BUN 32*  CREATININE 1.70*  CALCIUM 8.7*   LFT No results for input(s): "PROT", "ALBUMIN", "AST", "ALT", "ALKPHOS", "BILITOT", "BILIDIR", "IBILI" in the last 72 hours. PT/INR No results for input(s): "LABPROT", "INR" in  the last 72 hours. Hepatitis Panel No results for input(s): "HEPBSAG", "HCVAB", "HEPAIGM", "HEPBIGM" in the last 72 hours.    Studies/Results: DG Chest Portable 1 View  Result Date: 08/15/2022 CLINICAL DATA:  Altered mental status EXAM: PORTABLE CHEST 1 VIEW COMPARISON:  Chest x-ray 07/10/2022.  CT of the chest 02/07/2022. FINDINGS: Heart is mildly enlarged. There some interstitial opacities in both lung bases with a small left pleural effusion. Left pleural calcifications are again noted. There is no pneumothorax or acute fracture. IMPRESSION: Mild cardiomegaly with interstitial opacities in both lung bases and a small left pleural effusion. Findings may represent mild pulmonary edema or atypical infection. Electronically Signed   By: Darliss Cheney M.D.   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 Hg 6.1 down from 8.4. Transfused one unit of PRBCs -> Hg 8.5. BP 91/51. HR 50's - 60. -NPO -IV fluids per the hospitalist  -Check H/H Q 6hrs x 24 hours -Transfused for Hg < 7.5 -Continue PPI Infusion -Defer endoscopic recommendations to Dr. Lavon Paganini   Ampullary cancer s/p ERCP with plastic CBD stent placement 12/2021. On EGD 7/6, plastic stent no longer visualized and prolonged observation of the ampullary area revealed passage of purulent bile consistent with ascending cholangitis. S/P ERCP with metal CBD stent placement 08/06/2022.  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 Patient denies history of alcohol use disorder   Atrial fibrillation. Off Eliquis  since 06/2022.   CKD   Arnaldo Natal  08/16/2022, 8:58AM    Attending physician's note  I have taken a history, reviewed the chart and examined the patient. I performed a substantive portion of this encounter, including complete performance of at least one of the key components, in conjunction with the APP. I agree with the APP's note, impression and recommendations.    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 with recurrent decline in hemoglobin  Patient was also recently diagnosed with findings of portal hypertension and cirrhosis  His presentation is consistent with anemia secondary to chronic GI blood loss from GAVE /radiation-induced gastroduodenitis and small bowel AVM  Transfuse PRBC to hemoglobin >7 IV iron infusion Will benefit from Aranesp, please check with  pharmacy if he can receive a dose as inpatient  Will avoid repeat EGD unless has signs of active GI hemorrhage  Patient should have routine monitoring of CBC as outpatient, IV iron infusion and Aranesp to prevent decline in hemoglobin and severe anemia  Full liquid diet and slowly advance diet tomorrow if hemoglobin remains stable posttransfusion  The patient and his son at bedside were provided an opportunity to ask questions and all were answered. They agreed with the plan and demonstrated an understanding of the instructions.  Iona Beard , MD [phone]

## 2022-08-16 NOTE — Assessment & Plan Note (Addendum)
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 Myrtie Neither in his 7/7 consult note. Just admitted for same 2 weeks ago.  Now back in to ED with recurrent ABLA and melena. Looks like goals of care discussions held with family during last admit, but ultimately family decided that pt be a full code and have full scope of care (see Dr. Patterson Hammersmith 7/8 note). NPO Transfusion as discussed in ABLA below Tele monitor PPI GTT EDP discussed with [facility] who will see in consult

## 2022-08-16 NOTE — Assessment & Plan Note (Signed)
ERCP with stent placed on 7/10.

## 2022-08-16 NOTE — ED Notes (Signed)
Patient transferred to room at this time. Placed back onto the monitor at this time. Respirations are regular and unlabored. Denies pain. Bolus protonix infusing at this time. Pt denies any needs. Warm blankets have been provided. Respirations are regular and unlabored at this time.

## 2022-08-16 NOTE — Assessment & Plan Note (Signed)
Cont synthroid 

## 2022-08-16 NOTE — ED Notes (Addendum)
ED TO INPATIENT HANDOFF REPORT  ED Nurse Name and Phone #: Arnetha Silverthorne [phone]  S Name/Age/Gender Carl Woods 89 y.o. male Room/Bed: 040C/040C  Code Status   Code Status: Full Code  Home/SNF/Other Home Patient oriented to: self, place, time, and situation Is this baseline? Yes   Triage Complete: Triage complete  Chief Complaint Gastrointestinal hemorrhage with melena [K92.1]  Triage Note Pt to the ed from facility [facility] for low hemoglobin of 5.4. Pt relays fatigue and weakness with slight SOB. Pt is on a blood thinner. Pt denies recent fall CP, LOC, dizziness.     Allergies Allergies  Allergen Reactions   Nsaids Other (See Comments)    Patient is to not take these because of kidney issues    Level of Care/Admitting Diagnosis ED Disposition     ED Disposition  Admit   Condition  --   Comment  Hospital Area: [facility] [[id number]]  Level of Care: Progressive [102]  Admit to Progressive based on following criteria: GI, ENDOCRINE disease patients with GI bleeding, acute liver failure or pancreatitis, stable with diabetic ketoacidosis or thyrotoxicosis (hypothyroid) state.  May place patient in observation at [facility] or [facility] if equivalent level of care is available:: No  Covid Evaluation: Asymptomatic - no recent exposure (last 10 days) testing not required  Diagnosis: Gastrointestinal hemorrhage with melena [[id number]]  Admitting Physician: Hillary Bow [[id number]]  Attending Physician: Hillary Bow [[id number]]          B Medical/Surgery History Past Medical History:  Diagnosis Date   Anal fistula    Arthritis    Fingers and hands   Atrial fibrillation (HCC)    AVM (arteriovenous malformation)    Clipped during Colonoscopy 05/2016   Barrett's esophagus    Bilateral cataracts    BPH (benign prostatic hyperplasia)    Cecal angiodysplasia 05/28/2016   ablated at colonoscopy   Deviated nasal septum    Diverticulosis of sigmoid colon    E.  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   Left Forehead (treatment after biopsy)   OSA on CPAP    Perianal rash    Recurrent epistaxis    Renal cyst 01/04/2013   Small left peripelvic renal cysts , noted on US Renal   SCCA (squamous cell carcinoma) of skin 10/17/2015   Left Sup Bridge of Nose (curet, cautery and 5FU)   Seasonal allergies    Superficial basal cell carcinoma (BCC) 10/17/2015   Left Bulb of Nose (curet, cautery and 5FU)   Tubular adenoma    Past Surgical History:  Procedure Laterality Date   BILIARY STENT PLACEMENT N/A 01/23/2022   Procedure: BILIARY STENT PLACEMENT;  Surgeon: Lemar Lofty., MD;  Location: [facility];  Service: Gastroenterology;  Laterality: N/A;   BILIARY STENT PLACEMENT N/A 08/06/2022   Procedure: BILIARY STENT PLACEMENT;  Surgeon: Lynann Bologna, MD;  Location: [facility];  Service: Gastroenterology;  Laterality: N/A;   BIOPSY  01/23/2022   Procedure: BIOPSY;  Surgeon: Meridee Score Netty Starring., MD;  Location: [facility];  Service: Gastroenterology;;   BIOPSY  07/09/2022   Procedure: BIOPSY;  Surgeon: Shellia Cleverly, DO;  Location: [facility] ENDOSCOPY;  Service: Gastroenterology;;   BIOPSY  08/02/2022   Procedure: BIOPSY;  Surgeon: Sherrilyn Rist, MD;  Location: [facility];  Service: Gastroenterology;;   COLONOSCOPY  multiple   CYSTOSCOPY     ENDOSCOPIC RETROGRADE CHOLANGIOPANCREATOGRAPHY (ERCP) WITH PROPOFOL N/A 01/23/2022   Procedure: ENDOSCOPIC RETROGRADE CHOLANGIOPANCREATOGRAPHY (ERCP) WITH PROPOFOL;  Surgeon: Meridee Score Netty Starring., MD;  Location: [facility];  Service: Gastroenterology;  Laterality: N/A;   ENTEROSCOPY N/A 08/02/2022   Procedure: ENTEROSCOPY;  Surgeon: Sherrilyn Rist, MD;  Location: [facility];  Service: Gastroenterology;  Laterality: N/A;   ERCP N/A  08/06/2022   Procedure: ENDOSCOPIC RETROGRADE CHOLANGIOPANCREATOGRAPHY (ERCP);  Surgeon: Lynann Bologna, MD;  Location: [facility];  Service: Gastroenterology;  Laterality: N/A;   ESOPHAGOGASTRODUODENOSCOPY  multiple   ESOPHAGOGASTRODUODENOSCOPY N/A 01/23/2022   Procedure: ESOPHAGOGASTRODUODENOSCOPY (EGD);  Surgeon: Lemar Lofty., MD;  Location: [facility];  Service: Gastroenterology;  Laterality: N/A;   ESOPHAGOGASTRODUODENOSCOPY N/A 07/09/2022   Procedure: ESOPHAGOGASTRODUODENOSCOPY (EGD);  Surgeon: Shellia Cleverly, DO;  Location: [facility] END[facility]COPY;  Service: Gastroenterology;  Laterality: N/A;   EUS N/A 01/23/2022   Procedure: UPPER ENDOSCOPIC ULTRASOUND (EUS) RADIAL;  Surgeon: Lemar Lofty., MD;  Location: [facility];  Service: Gastroenterology;  Laterality: N/A;   EVALUATION UNDER ANESTHESIA WITH FISTULECTOMY N/A 03/02/2018   Procedure: ANORECTAL EXAM UNDER ANESTHESIA WITH REPAIR OF SUPERFICIAL PERIRECTAL FISTULA AND HEMORRHOIDECTOMY;  Surgeon: [name], MD;  Location: [facility];  Service: General;  Laterality: N/A;   EXPLORATORY LAPAROTOMY     HEMOSTASIS CLIP PLACEMENT  07/09/2022   Procedure: HEMOSTASIS CLIP PLACEMENT;  Surgeon: Shellia Cleverly, DO;  Location: [facility];  Service: Gastroenterology;;   HOT HEMOSTASIS N/A 08/02/2022   Procedure: HOT HEMOSTASIS (ARGON PLASMA COAGULATION/BICAP);  Surgeon: Sherrilyn Rist, MD;  Location: [facility];  Service: Gastroenterology;  Laterality: N/A;   IR THORACENTESIS ASP PLEURAL SPACE W/IMG GUIDE  07/10/2022   REMOVAL OF STONES  01/23/2022   Procedure: REMOVAL OF STONES;  Surgeon: Meridee Score Netty Starring., MD;  Location: [facility];  Service: Gastroenterology;;   REMOVAL OF STONES  08/06/2022   Procedure: REMOVAL OF STONES;  Surgeon: Lynann Bologna, MD;  Location: [facility];  Service: Gastroenterology;;   Dennison Mascot  01/23/2022   Procedure: Dennison Mascot;  Surgeon: Lemar Lofty., MD;  Location: [facility];  Service: Gastroenterology;;     A IV Location/Drains/Wounds Patient Lines/Drains/Airways Status     Active Line/Drains/Airways     Name Placement date Placement time Site Days   Peripheral IV 08/15/22 20 G Anterior;Right Forearm 08/15/22  1920  Forearm  1   GI Stent 08/06/22  1414  --  10   Wound / Incision (Open or Dehisced) 08/06/22 Skin tear Hand Left;Posterior 1/4" x 1/4" 08/06/22  1414  Hand  10            Intake/Output Last 24 hours  Intake/Output Summary (Last 24 hours) at 08/16/2022 1311 Last data filed at 08/16/2022 1112 Gross per 24 hour  Intake --  Output 475 ml  Net -475 ml    Labs/Imaging Results for orders placed or performed during the hospital encounter of 08/15/22 (from the past 48 hour(s))  CBC with Differential     Status: Abnormal   Collection Time: 08/15/22  7:25 PM  Result Value Ref Range   WBC 4.3 4.0 - 10.5 K/uL   RBC 1.85 (L) 4.22 - 5.81 MIL/uL   Hemoglobin 6.1 (LL) 13.0 - 17.0 g/dL    Comment: REPEATED TO VERIFY THIS CRITICAL RESULT HAS VERIFIED AND BEEN CALLED TO K,ROSS RN BY ELYSE BENTON ON 07 19 2024 AT 1952, AND HAS BEEN READ BACK.  HCT 20.1 (L) 39.0 - 52.0 %   MCV 108.6 (H) 80.0 - 100.0 fL   MCH 33.0 26.0 - 34.0 pg   MCHC 30.3 30.0 - 36.0 g/dL   RDW 08.6 (H) 57.8 - 46.9 %   Platelets 135 (L) 150 - 400 K/uL   nRBC 0.9 (H) 0.0 - 0.2 %   Neutrophils Relative % 61 %   Neutro Abs 2.6 1.7 - 7.7 K/uL   Lymphocytes Relative 22 %   Lymphs Abs 1.0 0.7 - 4.0 K/uL   Monocytes Relative 12 %   Monocytes Absolute 0.5 0.1 - 1.0 K/uL   Eosinophils Relative 3 %   Eosinophils Absolute 0.1 0.0 - 0.5 K/uL   Basophils Relative 1 %   Basophils Absolute 0.0 0.0 - 0.1 K/uL   Immature Granulocytes 1 %   Abs Immature Granulocytes 0.03 0.00 - 0.07 K/uL    Comment: Performed at [facility], [address], [place], [place] [address]  Basic metabolic panel     Status: Abnormal   Collection Time: 08/15/22  7:25 PM  Result Value Ref  Range   Sodium 138 135 - 145 mmol/L   Potassium 4.1 3.5 - 5.1 mmol/L   Chloride 107 98 - 111 mmol/L   CO2 22 22 - 32 mmol/L   Glucose, Bld 117 (H) 70 - 99 mg/dL    Comment: Glucose reference range applies only to samples taken after fasting for at least 8 hours.   BUN 32 (H) 8 - 23 mg/dL   Creatinine, Ser 8.41 (H) 0.61 - 1.24 mg/dL   Calcium 8.7 (L) 8.9 - 10.3 mg/dL   GFR, Estimated 38 (L) >60 mL/min    Comment: (NOTE) Calculated using the CKD-EPI Creatinine Equation (2021)    Anion gap 9 5 - 15    Comment: Performed at [facility], [address], [place], [place] [address]  Type and screen [facility]     Status: None (Preliminary result)   Collection Time: 08/15/22  7:25 PM  Result Value Ref Range   ABO/RH(D) A NEG    Antibody Screen NEG    Sample Expiration 08/18/2022,2359    Unit Number [id number]    Blood Component Type RED CELLS,LR    Unit division 00    Status of Unit ISSUED    Transfusion Status OK TO TRANSFUSE    Crossmatch Result      Compatible Performed at [facility], [address] [place], [place] [address]   Lipase, blood     Status: None   Collection Time: 08/15/22  7:25 PM  Result Value Ref Range   Lipase 27 11 - 51 U/L    Comment: Performed at [facility], [address], [place], [place] [address]  Prepare RBC (crossmatch)     Status: None   Collection Time: 08/15/22  8:12 PM  Result Value Ref Range   Order Confirmation      ORDER PROCESSED BY BLOOD BANK Performed at [facility], [address] [place], [place] [address]   Hemoglobin and hematocrit, blood     Status: Abnormal   Collection Time: 08/16/22  3:15 AM  Result Value Ref Range   Hemoglobin 8.5 (L) 13.0 - 17.0 g/dL    Comment: REPEATED TO VERIFY POST TRANSFUSION SPECIMEN    HCT 28.0 (L) 39.0 - 52.0 %    Comment: Performed at [facility], [address], [place], [place]  [address]   DG Chest Portable 1 View  Result Date:  08/15/2022 CLINICAL DATA:  Altered mental status EXAM: PORTABLE CHEST 1 VIEW COMPARISON:  Chest x-ray 07/10/2022.  CT of the chest 02/07/2022. FINDINGS: Heart is mildly enlarged. There some interstitial opacities in both lung bases with a small left pleural effusion. Left pleural calcifications are again noted. There is no pneumothorax or acute fracture. IMPRESSION: Mild cardiomegaly with interstitial opacities in both lung bases and a small left pleural effusion. Findings may represent mild pulmonary edema or atypical infection. Electronically Signed   By: Darliss Cheney M.D.   On: 08/15/2022 19:35    Pending Labs Unresulted Labs (From admission, onward)     Start     Ordered   08/16/22 0400  Basic metabolic panel  Once,   R        08/16/22 0400   08/15/22 2325  Hemoglobin and hematocrit, blood  Now then every 6 hours,   R (with STAT, TIMED occurrences)      08/15/22 2324            Vitals/Pain Today's Vitals   08/16/22 0600 08/16/22 0824 08/16/22 0830 08/16/22 1130  BP: (!) 105/50 (!) 101/42 (!) 91/51 (!) 108/58  Pulse: (!) 53 60 (!) 58 68  Resp: 18 20 17 16   Temp:  97.6 F (36.4 C)  98.2 F (36.8 C)  TempSrc:  Oral  Oral  SpO2: 100% 100% 99% 100%  Weight:      Height:      PainSc:        Isolation Precautions No active isolations  Medications Medications  pantoprozole (PROTONIX) 80 mg /NS 100 mL infusion (8 mg/hr Intravenous New Bag/Given 08/16/22 1202)  pantoprazole (PROTONIX) injection 40 mg (has no administration in time range)  acetaminophen (TYLENOL) tablet 650 mg (has no administration in time range)    Or  acetaminophen (TYLENOL) suppository 650 mg (has no administration in time range)  ondansetron (ZOFRAN) tablet 4 mg (has no administration in time range)    Or  ondansetron (ZOFRAN) injection 4 mg (has no administration in time range)  cholecalciferol (VITAMIN D3) 25 MCG (1000 UNIT) tablet 5,000 Units (5,000 Units Oral Given 08/16/22 0921)  ferrous sulfate  tablet 325 mg (has no administration in time range)  levothyroxine (SYNTHROID) tablet 150 mcg (150 mcg Oral Given 08/16/22 0554)  olopatadine (PATANOL) 0.1 % ophthalmic solution 1 drop (has no administration in time range)  sodium chloride (OCEAN) 0.65 % nasal spray 1 spray (has no administration in time range)  sucralfate (CARAFATE) 1 GM/10ML suspension 1 g (1 g Oral Given 08/16/22 1214)  0.9 %  sodium chloride infusion (Manually program via Guardrails IV Fluids) (0 mLs Intravenous Stopped 08/15/22 2322)  pantoprazole (PROTONIX) 80 mg /NS 100 mL IVPB (0 mg Intravenous Stopped 08/16/22 0108)    Mobility walks     Focused Assessments GI    R Recommendations: See Admitting Provider Note  Report given to:   Additional Notes:

## 2022-08-16 NOTE — Assessment & Plan Note (Signed)
Appears to be in rate controlled a.fib today. Not on AC at this time, presumably related to recent GIBs including just 2 weeks ago.

## 2022-08-16 NOTE — Progress Notes (Signed)
Brief progress note: -Patient was admitted earlier today. -Patient has complicated GI history. -Patient is well-known to the GI team. -Patient has undergone EGD and ERCP.  As per H&P done earlier today by Dr. Lyda Perone: " Carl Woods is a 89 y.o. 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   Recent hospital stay in June for GIB due to prepyloric ulcer s/p clipping.   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  These were treated with APC.  Pt also had bilary stent placed during that admission on 7/10.   Hemoglobin 5 on admission underwent 2 units of PRBC improved to 10.2, decreased to 7.8 and ultimately stabilized for a couple of days.  Was 8.4 on day of discharge 7/12.   Pt in to ED today with fatigue.  HGB at facility was reportedly 5.4   In ED: HGB 6.1   Pt transfused 1u PRBC"  08/24/2022: GI input is appreciated.  Repeat hemoglobin is 6.4 g/dL.  Will transfuse with packed red blood cells.  Goal is to keep hemoglobin greater than 7.  Will consult palliative care team.  Guarded prognosis.

## 2022-08-16 NOTE — Assessment & Plan Note (Signed)
Presumably due to ongoing / recurrent GIB from AVMs most likely.  Recent h/o same just 2 weeks ago. 1u PRBC transfusion in ED H/H now and Q6H ordered May need further transfusions.

## 2022-08-17 DIAGNOSIS — Z87891 Personal history of nicotine dependence: Secondary | ICD-10-CM | POA: Diagnosis not present

## 2022-08-17 DIAGNOSIS — K921 Melena: Secondary | ICD-10-CM | POA: Diagnosis not present

## 2022-08-17 DIAGNOSIS — K922 Gastrointestinal hemorrhage, unspecified: Secondary | ICD-10-CM | POA: Diagnosis not present

## 2022-08-17 DIAGNOSIS — K5521 Angiodysplasia of colon with hemorrhage: Secondary | ICD-10-CM | POA: Diagnosis not present

## 2022-08-17 DIAGNOSIS — K31819 Angiodysplasia of stomach and duodenum without bleeding: Secondary | ICD-10-CM | POA: Diagnosis not present

## 2022-08-17 DIAGNOSIS — K219 Gastro-esophageal reflux disease without esophagitis: Secondary | ICD-10-CM | POA: Diagnosis not present

## 2022-08-17 DIAGNOSIS — K2990 Gastroduodenitis, unspecified, without bleeding: Secondary | ICD-10-CM | POA: Diagnosis not present

## 2022-08-17 DIAGNOSIS — K3189 Other diseases of stomach and duodenum: Secondary | ICD-10-CM

## 2022-08-17 DIAGNOSIS — E039 Hypothyroidism, unspecified: Secondary | ICD-10-CM | POA: Diagnosis not present

## 2022-08-17 DIAGNOSIS — I509 Heart failure, unspecified: Secondary | ICD-10-CM | POA: Diagnosis not present

## 2022-08-17 DIAGNOSIS — I5033 Acute on chronic diastolic (congestive) heart failure: Secondary | ICD-10-CM | POA: Diagnosis not present

## 2022-08-17 DIAGNOSIS — I2510 Atherosclerotic heart disease of native coronary artery without angina pectoris: Secondary | ICD-10-CM | POA: Diagnosis not present

## 2022-08-17 DIAGNOSIS — N186 End stage renal disease: Secondary | ICD-10-CM | POA: Diagnosis not present

## 2022-08-17 DIAGNOSIS — K2980 Duodenitis without bleeding: Secondary | ICD-10-CM | POA: Diagnosis not present

## 2022-08-17 DIAGNOSIS — K264 Chronic or unspecified duodenal ulcer with hemorrhage: Secondary | ICD-10-CM | POA: Diagnosis not present

## 2022-08-17 DIAGNOSIS — K7460 Unspecified cirrhosis of liver: Secondary | ICD-10-CM | POA: Diagnosis not present

## 2022-08-17 DIAGNOSIS — Z66 Do not resuscitate: Secondary | ICD-10-CM | POA: Diagnosis not present

## 2022-08-17 DIAGNOSIS — I4821 Permanent atrial fibrillation: Secondary | ICD-10-CM | POA: Diagnosis not present

## 2022-08-17 DIAGNOSIS — Z515 Encounter for palliative care: Secondary | ICD-10-CM | POA: Diagnosis not present

## 2022-08-17 DIAGNOSIS — W888XXA Exposure to other ionizing radiation, initial encounter: Secondary | ICD-10-CM | POA: Diagnosis not present

## 2022-08-17 DIAGNOSIS — R4182 Altered mental status, unspecified: Secondary | ICD-10-CM | POA: Diagnosis not present

## 2022-08-17 DIAGNOSIS — K2991 Gastroduodenitis, unspecified, with bleeding: Secondary | ICD-10-CM | POA: Diagnosis not present

## 2022-08-17 DIAGNOSIS — G9341 Metabolic encephalopathy: Secondary | ICD-10-CM | POA: Diagnosis not present

## 2022-08-17 DIAGNOSIS — D649 Anemia, unspecified: Secondary | ICD-10-CM | POA: Diagnosis not present

## 2022-08-17 DIAGNOSIS — Z7189 Other specified counseling: Secondary | ICD-10-CM | POA: Diagnosis not present

## 2022-08-17 DIAGNOSIS — I4891 Unspecified atrial fibrillation: Secondary | ICD-10-CM | POA: Diagnosis not present

## 2022-08-17 DIAGNOSIS — K31811 Angiodysplasia of stomach and duodenum with bleeding: Secondary | ICD-10-CM | POA: Diagnosis not present

## 2022-08-17 DIAGNOSIS — Z7901 Long term (current) use of anticoagulants: Secondary | ICD-10-CM | POA: Diagnosis not present

## 2022-08-17 DIAGNOSIS — N1832 Chronic kidney disease, stage 3b: Secondary | ICD-10-CM | POA: Diagnosis not present

## 2022-08-17 DIAGNOSIS — K21 Gastro-esophageal reflux disease: Secondary | ICD-10-CM | POA: Diagnosis not present

## 2022-08-17 DIAGNOSIS — R58 Hemorrhage, not elsewhere classified: Secondary | ICD-10-CM | POA: Diagnosis not present

## 2022-08-17 DIAGNOSIS — I214 Non-ST elevation (NSTEMI) myocardial infarction: Secondary | ICD-10-CM | POA: Diagnosis not present

## 2022-08-17 DIAGNOSIS — K7581 Nonalcoholic steatohepatitis (NASH): Secondary | ICD-10-CM | POA: Diagnosis not present

## 2022-08-17 DIAGNOSIS — K2270 Barrett's esophagus without dysplasia: Secondary | ICD-10-CM | POA: Diagnosis not present

## 2022-08-17 DIAGNOSIS — R531 Weakness: Secondary | ICD-10-CM | POA: Diagnosis not present

## 2022-08-17 DIAGNOSIS — I5032 Chronic diastolic (congestive) heart failure: Secondary | ICD-10-CM | POA: Diagnosis not present

## 2022-08-17 DIAGNOSIS — Y842 Radiological procedure and radiotherapy as the cause of abnormal reaction of the patient, or of later complication, without mention of misadventure at the time of the procedure: Secondary | ICD-10-CM | POA: Diagnosis present

## 2022-08-17 DIAGNOSIS — H539 Unspecified visual disturbance: Secondary | ICD-10-CM | POA: Diagnosis not present

## 2022-08-17 DIAGNOSIS — Z8249 Family history of ischemic heart disease and other diseases of the circulatory system: Secondary | ICD-10-CM | POA: Diagnosis not present

## 2022-08-17 DIAGNOSIS — D62 Acute posthemorrhagic anemia: Secondary | ICD-10-CM | POA: Diagnosis not present

## 2022-08-17 DIAGNOSIS — F0390 Unspecified dementia without behavioral disturbance: Secondary | ICD-10-CM | POA: Diagnosis not present

## 2022-08-17 DIAGNOSIS — N400 Enlarged prostate without lower urinary tract symptoms: Secondary | ICD-10-CM | POA: Diagnosis not present

## 2022-08-17 DIAGNOSIS — K2981 Duodenitis with bleeding: Secondary | ICD-10-CM | POA: Diagnosis not present

## 2022-08-17 DIAGNOSIS — Z8041 Family history of malignant neoplasm of ovary: Secondary | ICD-10-CM | POA: Diagnosis not present

## 2022-08-17 DIAGNOSIS — K766 Portal hypertension: Secondary | ICD-10-CM

## 2022-08-17 DIAGNOSIS — N183 Chronic kidney disease, stage 3 unspecified: Secondary | ICD-10-CM | POA: Diagnosis not present

## 2022-08-17 DIAGNOSIS — Z85828 Personal history of other malignant neoplasm of skin: Secondary | ICD-10-CM | POA: Diagnosis not present

## 2022-08-17 DIAGNOSIS — Z7401 Bed confinement status: Secondary | ICD-10-CM | POA: Diagnosis not present

## 2022-08-17 DIAGNOSIS — C241 Malignant neoplasm of ampulla of Vater: Secondary | ICD-10-CM | POA: Diagnosis not present

## 2022-08-17 DIAGNOSIS — D500 Iron deficiency anemia secondary to blood loss (chronic): Secondary | ICD-10-CM | POA: Diagnosis not present

## 2022-08-17 DIAGNOSIS — Z923 Personal history of irradiation: Secondary | ICD-10-CM | POA: Diagnosis not present

## 2022-08-17 DIAGNOSIS — I4819 Other persistent atrial fibrillation: Secondary | ICD-10-CM | POA: Diagnosis not present

## 2022-08-17 DIAGNOSIS — Z79890 Hormone replacement therapy (postmenopausal): Secondary | ICD-10-CM | POA: Diagnosis not present

## 2022-08-17 DIAGNOSIS — K5520 Angiodysplasia of colon without hemorrhage: Secondary | ICD-10-CM | POA: Diagnosis not present

## 2022-08-17 LAB — HEMOGLOBIN AND HEMATOCRIT, BLOOD
HCT: 24.1 % — ABNORMAL LOW (ref 39.0–52.0)
HCT: 22.8 % — ABNORMAL LOW (ref 39.0–52.0)
HCT: 24.7 % — ABNORMAL LOW (ref 39.0–52.0)
HCT: 23.7 % — ABNORMAL LOW (ref 39.0–52.0)
Hemoglobin: 7.8 g/dL — ABNORMAL LOW (ref 13.0–17.0)
Hemoglobin: 7.3 g/dL — ABNORMAL LOW (ref 13.0–17.0)
Hemoglobin: 7.9 g/dL — ABNORMAL LOW (ref 13.0–17.0)
Hemoglobin: 7.6 g/dL — ABNORMAL LOW (ref 13.0–17.0)

## 2022-08-17 LAB — BASIC METABOLIC PANEL
Anion gap: 6 (ref 5–15)
BUN: 29 mg/dL — ABNORMAL HIGH (ref 8–23)
CO2: 20 mmol/L — ABNORMAL LOW (ref 22–32)
Calcium: 8.5 mg/dL — ABNORMAL LOW (ref 8.9–10.3)
Chloride: 113 mmol/L — ABNORMAL HIGH (ref 98–111)
Creatinine, Ser: 1.53 mg/dL — ABNORMAL HIGH (ref 0.61–1.24)
GFR, Estimated: 43 mL/min — ABNORMAL LOW (ref >60)
Glucose, Bld: 88 mg/dL (ref 70–99)
Potassium: 3.9 mmol/L (ref 3.5–5.1)
Sodium: 139 mmol/L (ref 135–145)

## 2022-08-17 NOTE — Plan of Care (Signed)
  Problem: Pain Managment: Goal: General experience of comfort will improve Outcome: Progressing   Problem: Safety: Goal: Ability to remain free from injury will improve Outcome: Progressing   Problem: Skin Integrity: Goal: Risk for impaired skin integrity will decrease Outcome: Progressing   

## 2022-08-17 NOTE — Progress Notes (Deleted)
Patient Care Team: [name], MD as PCP - General (Internal Medicine) [name], MD as Consulting Physician (General Surgery) [name], MD as Consulting Physician (Gastroenterology) [name], MD as Consulting Physician (Otolaryngology) [name], MD as Consulting Physician (Cardiology) Suzi Roots as Physician Assistant (Dermatology) Malachy Mood, MD as Consulting Physician (Oncology)   CHIEF COMPLAINT:   Oncology History  Cancer of ampulla of Vater [facility])  12/06/2021 Imaging   CT ABDOMEN PELVIS W CONTRAST   IMPRESSION: 1. There is moderate intrahepatic bile duct dilatation with marked fusiform dilatation of the common bile duct. No CT visible common bile duct stones identified. Differential considerations include a obstructing distal common bile duct stone (not visible by CT), distal CBD stricture, or mass at the level of the ampullary. Consider further evaluation with contrast enhanced MRI/MRCP or ERCP. 2. Contour the liver appears nodular which may reflect underlying cirrhosis. 3. Age-indeterminate compression fracture involving L1 vertebral body with loss of 50% of the vertebral body height. No signs of retropulsion of fracture fragments into the canal. 4. Fat containing umbilical hernia. 5. 5 mm anterolisthesis of L4 on L5. 6.  Aortic Atherosclerosis (ICD10-I70.0).   12/16/2021 Imaging   MR ABDOMEN MRCP W WO CONTAST   IMPRESSION: 1. Moderate intrahepatic and extrahepatic bile duct dilation with smooth tapering to the level of the ampulla without filling defect or focal mass lesion identified. Findings may reflect ampullary stricture or occult ampullary mass. Consider further evaluation with ERCP. 2. Mild dilated main pancreatic duct at the head measuring up to 8 mm. 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 Recommend follow-up pre and  post-contrast MRI/MRCP in 2 years. This recommendation follows ACR consensus guidelines: Management of Incidental Pancreatic Cysts: A White Paper of the ACR Incidental Findings Committee. J Am Coll Radiol 2017;14:911-923. 3.  Aortic Atherosclerosis (ICD10-I70.0).   01/23/2022 Procedure   DG ERCP   IMPRESSION: Dilatation of the extrahepatic bile duct with severe tapering or narrowing in the distal common bile duct. Placement of biliary stent.   These images were submitted for radiologic interpretation only. Please see the procedural report for the amount of contrast.    01/23/2022 Procedure   EUS/ERCP  ENDOSONOGRAPHIC FINDING: There was dilation in the common bile duct (12.3 mm -> 20.4 mm) and in the common hepatic duct (23.0 mm). A small amount of hyperechoic material consistent with sludge was visualized endosonographically in the common bile duct. Moderate hyperechoic material consistent with sludge was visualized endosonographically in the gallbladder with normal gallbladder wall thickness. Pancreatic parenchymal abnormalities were noted in the entire pancreas. These consisted of hyperechoic foci with shadowing and cysts. There was a 9.1 mm by 6.1 mm cyst in the neck of the pancreas. There was a 15.3 mm by 14.1 mm cyst in the tail of the pancreas. The pancreatic duct had a dilated endosonographic appearance, had a prominently branched endosonographic appearance and had hyperechoic walls in the pancreatic head (PD - 8.2 mm -> 4.3 mm), genu of the pancreas (3.5 mm), body of the pancreas (3.2 mm) and tail of the pancreas (2.8 mm). A hypoechoic irregular lesion was identified endosonographically at the ampulla. The lesion measured 15 mm by 13 mm in maximal cross-sectional diameter. The lesion extended from the mucosa to the submucosa. The outer margins were irregular. This is noted where CBD and PD dilation is noted to occur. Endosonographic imaging in the visualized portion of  the liver showed no mass.  No malignant-appearing lymph nodes were visualized in the celiac region (level 20), peripancreatic region and porta hepatis region. The celiac region was visualized.   01/23/2022 Pathology Results   SURGICAL PATHOLOGY  CASE: [id number]  PATIENT: Charlesetta Ivory  Surgical Pathology Report   FINAL MICROSCOPIC DIAGNOSIS:   A. STOMACH, RANDOM, BIOPSY:  - Gastric mucosa, no significant abnormality.  No inflammation,  intestinal metaplasia, dysplasia or malignancy.   B. AMPULLARY LESION, BIOPSY:  - Adenocarcinoma.  See comment.    01/31/2022 Initial Diagnosis   Cancer of ampulla of Vater (HCC)   02/07/2022 Imaging    IMPRESSION: Interval increase in size of lymph node in the anterior cardiophrenic space on the right.   Increase small right pleural effusion compared to the previous MRI.   Subtle nodular tissue along the margin of the right hepatic lobe in the upper abdomen posteriorly. Recommend additional workup when appropriate.   Calcified pleural plaques.   Evidence of chronic liver disease.   Aortic Atherosclerosis (ICD10-I70.0).   02/14/2022 Miscellaneous   Foundation One:  Biomarker Findings Microsatellite status-Cannot be determined Tumor Mutation Burden-Cannot be determined  Genomics Findings EPHB4 amplificatio SF3B1 K666T TP53 R273H       CURRENT THERAPY:   INTERVAL HISTORY   ROS   Past Medical History:  Diagnosis Date   Anal fistula    Arthritis    Fingers and hands   Atrial fibrillation (HCC)    AVM (arteriovenous malformation)    Clipped during Colonoscopy 05/2016   Barrett's esophagus    Bilateral cataracts    BPH (benign prostatic hyperplasia)    Cecal angiodysplasia 05/28/2016   ablated at colonoscopy   Deviated nasal septum    Diverticulosis of sigmoid colon    E. coli infection    Fatty liver    GERD (gastroesophageal reflux disease)    History of colon polyps    Hypothyroidism    Iron deficiency anemia     Nodular basal cell carcinoma (BCC) 11/05/2017   Left Forehead (treatment after biopsy)   OSA on CPAP    Perianal rash    Recurrent epistaxis    Renal cyst 01/04/2013   Small left peripelvic renal cysts , noted on US Renal   SCCA (squamous cell carcinoma) of skin 10/17/2015   Left Sup Bridge of Nose (curet, cautery and 5FU)   Seasonal allergies    Superficial basal cell carcinoma (BCC) 10/17/2015   Left Bulb of Nose (curet, cautery and 5FU)   Tubular adenoma      Past Surgical History:  Procedure Laterality Date   BILIARY STENT PLACEMENT N/A 01/23/2022   Procedure: BILIARY STENT PLACEMENT;  Surgeon: Lemar Lofty., MD;  Location: [facility];  Service: Gastroenterology;  Laterality: N/A;   BILIARY STENT PLACEMENT N/A 08/06/2022   Procedure: BILIARY STENT PLACEMENT;  Surgeon: Lynann Bologna, MD;  Location: [facility];  Service: Gastroenterology;  Laterality: N/A;   BIOPSY  01/23/2022   Procedure: BIOPSY;  Surgeon: Meridee Score Netty Starring., MD;  Location: [facility];  Service: Gastroenterology;;   BIOPSY  07/09/2022   Procedure: BIOPSY;  Surgeon: Shellia Cleverly, DO;  Location: [facility];  Service: Gastroenterology;;   BIOPSY  08/02/2022   Procedure: BIOPSY;  Surgeon: Sherrilyn Rist, MD;  Location: [facility];  Service: Gastroenterology;;   COLONOSCOPY  multiple   CYSTOSCOPY     ENDOSCOPIC RETROGRADE CHOLANGIOPANCREATOGRAPHY (ERCP) WITH PROPOFOL N/A 01/23/2022   Procedure: ENDOSCOPIC RETROGRADE CHOLANGIOPANCREATOGRAPHY (ERCP) WITH PROPOFOL;  Surgeon: Meridee Score,  Netty Starring., MD;  Location: [facility];  Service: Gastroenterology;  Laterality: N/A;   ENTEROSCOPY N/A 08/02/2022   Procedure: ENTEROSCOPY;  Surgeon: Sherrilyn Rist, MD;  Location: [facility];  Service: Gastroenterology;  Laterality: N/A;   ERCP N/A 08/06/2022   Procedure: ENDOSCOPIC RETROGRADE CHOLANGIOPANCREATOGRAPHY (ERCP);  Surgeon: Lynann Bologna, MD;  Location: [facility];  Service:  Gastroenterology;  Laterality: N/A;   ESOPHAGOGASTRODUODENOSCOPY  multiple   ESOPHAGOGASTRODUODENOSCOPY N/A 01/23/2022   Procedure: ESOPHAGOGASTRODUODENOSCOPY (EGD);  Surgeon: Lemar Lofty., MD;  Location: [facility];  Service: Gastroenterology;  Laterality: N/A;   ESOPHAGOGASTRODUODENOSCOPY N/A 07/09/2022   Procedure: ESOPHAGOGASTRODUODENOSCOPY (EGD);  Surgeon: Shellia Cleverly, DO;  Location[facility];  Service: Gastroenterology;  Laterality: N/A;   EUS N/A 01/23/2022   Procedure: UPPER ENDOSCOPIC ULTRASOUND (EUS) RADIAL;  Surgeon: Lemar Lofty., MD;  Location: [facility];  Service: Gastroenterology;  Laterality: N/A;   EVALUATION UNDER ANESTHESIA WITH FISTULECTOMY N/A 03/02/2018   Procedure: ANORECTAL EXAM UNDER ANESTHESIA WITH REPAIR OF SUPERFICIAL PERIRECTAL FISTULA AND HEMORRHOIDECTOMY;  Surgeon: [name], MD;  Location: [facility];  Service: General;  Laterality: N/A;   EXPLORATORY LAPAROTOMY     HEMOSTASIS CLIP PLACEMENT  07/09/2022   Procedure: HEMOSTASIS CLIP PLACEMENT;  Surgeon: Shellia Cleverly, DO;  Location: [facility];  Service: Gastroenterology;;   HOT HEMOSTASIS N/A 08/02/2022   Procedure: HOT HEMOSTASIS (ARGON PLASMA COAGULATION/BICAP);  Surgeon: Sherrilyn Rist, MD;  Location: [facility];  Service: Gastroenterology;  Laterality: N/A;   IR THORACENTESIS ASP PLEURAL SPACE W/IMG GUIDE  07/10/2022   REMOVAL OF STONES  01/23/2022   Procedure: REMOVAL OF STONES;  Surgeon: Lemar Lofty., MD;  Location: [facility];  Service: Gastroenterology;;   REMOVAL OF STONES  08/06/2022   Procedure: REMOVAL OF STONES;  Surgeon: Lynann Bologna, MD;  Location: [facility];  Service: Gastroenterology;;   Dennison Mascot  01/23/2022   Procedure: Dennison Mascot;  Surgeon: Lemar Lofty., MD;  Location: [facility];  Service: Gastroenterology;;     Facility-Administered Encounter Medications as of 08/18/2022  Medication   acetaminophen (TYLENOL)  tablet 650 mg   Or   acetaminophen (TYLENOL) suppository 650 mg   cholecalciferol (VITAMIN D3) 25 MCG (1000 UNIT) tablet 5,000 Units   ferric gluconate (FERRLECIT) 250 mg in sodium chloride 0.9 % 250 mL IVPB   [START ON 08/18/2022] ferrous sulfate tablet 325 mg   levothyroxine (SYNTHROID) tablet 150 mcg   olopatadine (PATANOL) 0.1 % ophthalmic solution 1 drop   ondansetron (ZOFRAN) tablet 4 mg   Or   ondansetron (ZOFRAN) injection 4 mg   [START ON 08/19/2022] pantoprazole (PROTONIX) injection 40 mg   pantoprozole (PROTONIX) 80 mg /NS 100 mL infusion   sodium chloride (OCEAN) 0.65 % nasal spray 1 spray   sucralfate (CARAFATE) 1 GM/10ML suspension 1 g   Outpatient Encounter Medications as of 08/18/2022  Medication Sig   bumetanide (BUMEX) 1 MG tablet Take 1 tablet (1 mg total) by mouth daily.   Cholecalciferol (VITAMIN D3) 5000 units CAPS Take 5,000 Units by mouth every morning.   dapagliflozin propanediol (FARXIGA) 10 MG TABS tablet Take 10 mg by mouth in the morning.   ferrous sulfate 325 (65 FE) MG tablet Take 325 mg by mouth every Monday, Wednesday, and Friday.   Glucosamine Sulfate 500 MG TABS Take 500 mg by mouth daily.   levothyroxine (SYNTHROID) 150 MCG tablet Take 150 mcg by mouth daily before breakfast.   nitroGLYCERIN (NITROSTAT) 0.4 MG SL tablet Place 1 tablet (0.4 mg total)  under the tongue every 5 (five) minutes as needed for up to 25 days for chest pain.   olopatadine (PATANOL) 0.1 % ophthalmic solution Place 1 drop into both eyes daily as needed for allergies.   pantoprazole (PROTONIX) 40 MG tablet Take 1 tablet (40 mg total) by mouth 2 (two) times daily.   potassium chloride SA (KLOR-CON M) 20 MEQ tablet Take 1 tablet (20 mEq total) by mouth 2 (two) times daily.   sodium chloride (OCEAN) 0.65 % SOLN nasal spray Place 1 spray into both nostrils as needed for congestion.   sucralfate (CARAFATE) 1 GM/10ML suspension Take 10 mLs (1 g total) by mouth 4 (four) times daily -  with  meals and at bedtime.     There were no vitals filed for this visit. There is no height or weight on file to calculate BMI.   PHYSICAL EXAM GENERAL:alert, no distress and comfortable SKIN: no rash  EYES: sclera clear NECK: without mass LYMPH:  no palpable cervical or supraclavicular lymphadenopathy  LUNGS: clear with normal breathing effort HEART: regular rate & rhythm, no lower extremity edema ABDOMEN: abdomen soft, non-tender and normal bowel sounds NEURO: alert & oriented x 3 with fluent speech, no focal motor/sensory deficits Breast exam:  PAC without erythema    CBC    Component Value Date/Time   WBC 4.3 08/15/2022 1925   RBC 1.85 (L) 08/15/2022 1925   HGB 7.3 (L) 08/17/2022 1109   HGB 10.4 (L) 02/21/2022 0811   HCT 22.8 (L) 08/17/2022 1109   PLT 135 (L) 08/15/2022 1925   PLT 158 02/21/2022 0811   MCV 108.6 (H) 08/15/2022 1925   MCH 33.0 08/15/2022 1925   MCHC 30.3 08/15/2022 1925   RDW 28.0 (H) 08/15/2022 1925   LYMPHSABS 1.0 08/15/2022 1925   MONOABS 0.5 08/15/2022 1925   EOSABS 0.1 08/15/2022 1925   BASOSABS 0.0 08/15/2022 1925     CMP     Component Value Date/Time   NA 139 08/17/2022 0528   K 3.9 08/17/2022 0528   CL 113 (H) 08/17/2022 0528   CO2 20 (L) 08/17/2022 0528   GLUCOSE 88 08/17/2022 0528   BUN 29 (H) 08/17/2022 0528   CREATININE 1.53 (H) 08/17/2022 0528   CREATININE 1.57 (H) 08/01/2022 1228   CALCIUM 8.5 (L) 08/17/2022 0528   PROT 5.4 (L) 08/07/2022 0554   ALBUMIN 2.2 (L) 08/07/2022 0554   AST 30 08/07/2022 0554   AST 28 08/01/2022 1228   ALT 18 08/07/2022 0554   ALT 15 08/01/2022 1228   ALKPHOS 56 08/07/2022 0554   BILITOT 0.6 08/07/2022 0554   BILITOT 0.7 08/01/2022 1228   GFRNONAA 43 (L) 08/17/2022 0528   GFRNONAA 42 (L) 08/01/2022 1228   GFRAA 55 (L) 02/23/2018 1345     ASSESSMENT & PLAN:  PLAN:  No orders of the defined types were placed in this encounter.     All questions were answered. The patient knows to call the  clinic with any problems, questions or concerns. No barriers to learning were detected. I spent *** counseling the patient face to face. The total time spent in the appointment was *** and more than 50% was on counseling, review of test results, and coordination of care.   Santiago Glad, NP-C @DATE @

## 2022-08-17 NOTE — Progress Notes (Signed)
[facility] ROUNDING NOTE   Subjective: He had 1 bowel movement with dark stool, was receiving oral iron at rehab Hemoglobin improved s/p PRBC   Objective: Vital signs in last 24 hours: Temp:  [97.6 F (36.4 C)-98.3 F (36.8 C)] 97.6 F (36.4 C) (07/21 0400) Pulse Rate:  [54-81] 75 (07/21 0400) Resp:  [14-31] 18 (07/21 0400) BP: (104-119)/(43-57) 111/49 (07/21 0400) SpO2:  [81 %-100 %] 96 % (07/21 0400)   General: NAD Abdomen: Soft, mild distention, nontender     Intake/Output from previous day: 07/20 0701 - 07/21 0700 In: 1215 [P.O.:360; I.V.:174.2; Blood:410.8; IV Piggyback:270] Out: 950 [Urine:950] Intake/Output this shift: No intake/output data recorded.   Lab Results: Recent Labs    08/15/22 1925 08/16/22 0315 08/16/22 2300 08/17/22 0528 08/17/22 1109  WBC 4.3  --   --   --   --   HGB 6.1*   < > 7.5* 7.8* 7.3*  PLT 135*  --   --   --   --   MCV 108.6*  --   --   --   --    < > = values in this interval not displayed.   BMET Recent Labs    08/15/22 1925 08/16/22 1524 08/17/22 0528  NA 138 140 139  K 4.1 4.1 3.9  CL 107 109 113*  CO2 22 22 20*  GLUCOSE 117* 99 88  BUN 32* 30* 29*  CREATININE 1.70* 1.62* 1.53*  CALCIUM 8.7* 8.4* 8.5*   LFT No results for input(s): "PROT", "ALBUMIN", "AST", "ALT", "ALKPHOS", "BILITOT", "BILIDIR", "IBILI" in the last 72 hours. PT/INR No results for input(s): "INR" in the last 72 hours.    Imaging/Other results: DG Chest Portable 1 View  Result Date: 08/15/2022 CLINICAL DATA:  Altered mental status EXAM: PORTABLE CHEST 1 VIEW COMPARISON:  Chest x-ray 07/10/2022.  CT of the chest 02/07/2022. FINDINGS: Heart is mildly enlarged. There some interstitial opacities in both lung bases with a small left pleural effusion. Left pleural calcifications are again noted. There is no pneumothorax or acute fracture. IMPRESSION: Mild cardiomegaly with interstitial opacities in both lung bases and a small left pleural  effusion. Findings may represent mild pulmonary edema or atypical infection. Electronically Signed   By: Darliss Cheney M.D.   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   Hold oral iron as it is confusing for rehab staff to differentiate dark stool from oral iron versus melena Routine monitoring of weekly CBC and iron panel monthly Transfuse PRBC to hemoglobin >7 Follow-up with hematology for IV iron infusion and Aranesp as needed   Will avoid repeat EGD unless has signs of active GI hemorrhage   Soft diet If hemoglobin remains stable, okay to discharge patient home from GI standpoint   This visit required 50 minutes of patient care (this includes precharting, chart review, review of results, face-to-face time used for counseling as well as treatment plan and follow-up. The patient and family at bedside were provided an opportunity to ask questions and all were answered. They agreed with the plan and demonstrated an understanding of the  instructions.     Iona Beard , MD [phone]  [facility]astroenter[facility]

## 2022-08-17 NOTE — Plan of Care (Signed)

## 2022-08-17 NOTE — Progress Notes (Signed)
PROGRESS NOTE  TRACKER [name]:[id number] DOB: [birth year] DOA: 08/15/2022 PCP: [name], MD  Brief History   As per H&P done earlier today by Dr. Lyda Perone: " Carl Woods is a 89 y.o. 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   Recent hospital stay in June for GIB due to prepyloric ulcer s/p clipping.   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  These were treated with APC.  Pt also had bilary stent placed during that admission on 7/10.   Hemoglobin 5 on admission underwent 2 units of PRBC improved to 10.2, decreased to 7.8 and ultimately stabilized for a couple of days.  Was 8.4 on day of discharge 7/12. He was discharged on oral iron.   Pt in to ED today with fatigue.  HGB at facility was reportedly 5.4.   In ED: HGB 6.1   Pt transfused 1u PRBC upon admission.  GI has been consulted. Dr. Lavon Paganini feels that his anemia is due to chronic GI blood loss from GAVE/radiation-induced gastroduodenitis and small bowel AVM. She has recommended holding the oral iron so as not to cause confusion between real melena and dark stool due to oral iron. She has recommended no repeat EGD unless there are signs of active GI hemorrhage. He will be placed on a soft diet.  Although patient has been cleared for discharge by GI, it seems prudent to continue to monitor the patient's Hgb overnight. His follow up H&H is 7.3. We will transfuse for hemoglobin less than 7.0.  Consultants  Gastroenterology  Procedures  Transfusion of 1 unit PRBC's  Interval History/Subjective  The patient is resting comfortably. No new complaints other than to say that he feels weak.  Objective   Vitals:  Vitals:   08/17/22 1200 08/17/22 1600  BP: 136/76 (!) 112/46  Pulse: 75  73  Resp: (!) 24 (!) 23  Temp:    SpO2: 95% 92%    Exam:  Constitutional:  The patient is awake, alert, and oriented x 3. No acute distress. He appears frail. Eyes:  pupils and irises appear normal Conjunctiva are pale. Respiratory:  No increased work of breathing. No wheezes, rales, or rhonchi No tactile fremitus Cardiovascular:  Regular rate and rhythm No murmurs, ectopy, or gallups. No lateral PMI. No thrills. Abdomen:  Abdomen is soft, non-tender, non-distended No hernias, masses, or organomegaly Normoactive bowel sounds.  Musculoskeletal:  No cyanosis, clubbing, or edema Skin:  No rashes, lesions, ulcers palpation of skin: no induration or nodules  I have personally reviewed the following:   Today's Data   Vitals:   08/17/22 1200 08/17/22 1600  BP: 136/76 (!) 112/46  Pulse: 75 73  Resp: (!) 24 (!) 23  Temp:    SpO2: 95% 92%     Lab Data  Hemoglobin & Hematocrit     Component Value Date/Time   HGB 7.3 (L) 08/17/2022 1109   HGB 10.4 (L) 02/21/2022 0811   HCT 22.8 (L) 08/17/2022 1109     Scheduled Meds:  cholecalciferol  5,000 Units Oral q morning   [START ON 08/18/2022] ferrous sulfate  325 mg Oral Q M,W,F   levothyroxine  150 mcg Oral QAC breakfast   [START ON 08/19/2022] pantoprazole  40 mg Intravenous Q12H   sucralfate  1 g Oral TID WC & HS  Continuous Infusions:  pantoprazole 8 mg/hr (08/17/22 0415)    Principal Problem:   Gastrointestinal hemorrhage with melena Active Problems:   Acute blood loss anemia   AVM (arteriovenous malformation) of small bowel, acquired with hemorrhage   Cancer of ampulla of Vater (HCC)   Atrial fibrillation (HCC)   Hypothyroidism   CKD (chronic kidney disease) stage 3, GFR 30-59 ml/min (HCC)   Chronic heart failure with preserved ejection fraction (HFpEF) (HCC)   GI bleed  A & P  Assessment and Plan: * Gastrointestinal hemorrhage with melena Pt with recurrent GIBs due to "Diffuse gastric GAVE-like  inflammation and mucosal friability from a combination of cirrhosis and radiation for the patient's ampullary cancer." As described by Dr. Myrtie Neither in his 7/7 consult note. Just admitted for same 2 weeks ago.  Now back in to ED with recurrent ABLA and melena. Looks like goals of care discussions held with family during last admit, but ultimately family decided that pt be a full code and have full scope of care (see Dr. Patterson Hammersmith 7/8 note). NPO Transfusion as discussed in ABLA below Tele monitor PPI GTT GI has evaluated the patient. No EGD is planned at this point. Monitor H&H Follow up H&H is 7.3. We will transfuse for hemoglobin less than 7.0. Transfuse for Hgb less than 7. Do not restart oral iron at discharge per recommendation of Dr. Lavon Paganini as it will confuse issue of real vs perceived melena.  Palliative care has been consulted. 10. Iron infusion has been ordered.   Acute blood loss anemia Presumably due to ongoing / recurrent GIB from AVMs most likely.  Recent h/o same just 2 weeks ago. 1u PRBC transfusion in ED H/H now and Q6H ordered May need further transfusions.   Cancer of ampulla of Vater (HCC) ERCP with stent placed on 7/10.   CKD (chronic kidney disease) stage 3, GFR 30-59 ml/min (HCC) Creat today of 1.7 looks to be similar to his baseline.   Hypothyroidism Cont synthroid   Atrial fibrillation (HCC) Appears to be in rate controlled a.fib today. Not on AC at this time, presumably related to recent GIBs including just 2 weeks ago.   I have seen and examined this patient myself. I have spent 34 minutes in his evaluation and care.    Advance Care Planning:   Code Status: Full Code Full code at this time, see also Dr. Patterson Hammersmith note from 7/8, family discussions during last admit (was made DNR but then family reversed course and wanted him full code).   Consults: EDP d/w [facility]   Family Communication: No family in room  DVT prophylaxis: SCD's Code Status: Full  Code Family  Communication: None available Disposition Plan: Return to rehab once stable.   [name], DO [facility] Direct contact: see [email or web address]  7PM-7AM contact night coverage as above 08/17/2022, 5:24 PM  LOS: 0 days    LOS: 0 days

## 2022-08-18 ENCOUNTER — Inpatient Hospital Stay: Payer: PPO

## 2022-08-18 ENCOUNTER — Inpatient Hospital Stay: Payer: PPO | Admitting: Nurse Practitioner

## 2022-08-18 DIAGNOSIS — K5520 Angiodysplasia of colon without hemorrhage: Secondary | ICD-10-CM | POA: Diagnosis not present

## 2022-08-18 DIAGNOSIS — K2990 Gastroduodenitis, unspecified, without bleeding: Secondary | ICD-10-CM | POA: Diagnosis not present

## 2022-08-18 DIAGNOSIS — Z515 Encounter for palliative care: Secondary | ICD-10-CM | POA: Diagnosis not present

## 2022-08-18 DIAGNOSIS — D500 Iron deficiency anemia secondary to blood loss (chronic): Secondary | ICD-10-CM

## 2022-08-18 DIAGNOSIS — C241 Malignant neoplasm of ampulla of Vater: Secondary | ICD-10-CM | POA: Diagnosis not present

## 2022-08-18 DIAGNOSIS — Z7189 Other specified counseling: Secondary | ICD-10-CM

## 2022-08-18 DIAGNOSIS — K31819 Angiodysplasia of stomach and duodenum without bleeding: Secondary | ICD-10-CM | POA: Diagnosis not present

## 2022-08-18 DIAGNOSIS — I4821 Permanent atrial fibrillation: Secondary | ICD-10-CM | POA: Diagnosis not present

## 2022-08-18 DIAGNOSIS — K921 Melena: Secondary | ICD-10-CM | POA: Diagnosis not present

## 2022-08-18 LAB — PROTIME-INR
INR: 1.1 (ref 0.8–1.2)
Prothrombin Time: 14.8 seconds (ref 11.4–15.2)

## 2022-08-18 LAB — BPAM RBC
Blood Product Expiration Date: 202408122359
Blood Product Expiration Date: 202408132359
ISSUE DATE / TIME: 202407192029
Unit Type and Rh: 600
Unit Type and Rh: 600

## 2022-08-18 LAB — CBC WITH DIFFERENTIAL/PLATELET
Abs Immature Granulocytes: 0.03 10*3/uL (ref 0.00–0.07)
Basophils Absolute: 0 10*3/uL (ref 0.0–0.1)
Basophils Relative: 1 %
Eosinophils Absolute: 0.2 10*3/uL (ref 0.0–0.5)
Eosinophils Relative: 3 %
HCT: 22.7 % — ABNORMAL LOW (ref 39.0–52.0)
Hemoglobin: 7.4 g/dL — ABNORMAL LOW (ref 13.0–17.0)
Immature Granulocytes: 1 %
Lymphocytes Relative: 15 %
Lymphs Abs: 1 10*3/uL (ref 0.7–4.0)
MCH: 32.6 pg (ref 26.0–34.0)
MCHC: 32.6 g/dL (ref 30.0–36.0)
MCV: 100 fL (ref 80.0–100.0)
Monocytes Absolute: 0.8 10*3/uL (ref 0.1–1.0)
Monocytes Relative: 12 %
Neutro Abs: 4.4 10*3/uL (ref 1.7–7.7)
Neutrophils Relative %: 68 %
Platelets: 141 10*3/uL — ABNORMAL LOW (ref 150–400)
RBC: 2.27 MIL/uL — ABNORMAL LOW (ref 4.22–5.81)
RDW: 25.9 % — ABNORMAL HIGH (ref 11.5–15.5)
WBC: 6.4 10*3/uL (ref 4.0–10.5)
nRBC: 0.8 % — ABNORMAL HIGH (ref 0.0–0.2)

## 2022-08-18 LAB — HEMOGLOBIN AND HEMATOCRIT, BLOOD
HCT: 24.3 % — ABNORMAL LOW (ref 39.0–52.0)
HCT: 25.4 % — ABNORMAL LOW (ref 39.0–52.0)
Hemoglobin: 7.8 g/dL — ABNORMAL LOW (ref 13.0–17.0)
Hemoglobin: 8.2 g/dL — ABNORMAL LOW (ref 13.0–17.0)

## 2022-08-18 LAB — BASIC METABOLIC PANEL
Anion gap: 3 — ABNORMAL LOW (ref 5–15)
BUN: 24 mg/dL — ABNORMAL HIGH (ref 8–23)
CO2: 24 mmol/L (ref 22–32)
Calcium: 8.5 mg/dL — ABNORMAL LOW (ref 8.9–10.3)
Chloride: 113 mmol/L — ABNORMAL HIGH (ref 98–111)
Creatinine, Ser: 1.47 mg/dL — ABNORMAL HIGH (ref 0.61–1.24)
GFR, Estimated: 45 mL/min — ABNORMAL LOW (ref >60)
Glucose, Bld: 92 mg/dL (ref 70–99)
Potassium: 3.9 mmol/L (ref 3.5–5.1)
Sodium: 140 mmol/L (ref 135–145)

## 2022-08-18 LAB — TYPE AND SCREEN
ABO/RH(D): A NEG
Antibody Screen: NEGATIVE
Unit division: 0
Unit division: 0

## 2022-08-18 LAB — PREPARE RBC (CROSSMATCH)

## 2022-08-18 MED ORDER — SODIUM CHLORIDE 0.9 % IV SOLN
250.0000 mg | Freq: Every day | INTRAVENOUS | Status: AC
  Administered 2022-08-18 – 2022-08-19 (×2): 250 mg via INTRAVENOUS
  Filled 2022-08-18 (×2): qty 20

## 2022-08-18 MED ORDER — SODIUM CHLORIDE 0.9% IV SOLUTION: Freq: Once | INTRAVENOUS | Status: AC

## 2022-08-18 NOTE — Progress Notes (Signed)
    Progress Note   LOS: 1 day   Chief Complaint:anemia   Subjective   Patient denies any bowel movements since yesterday.  Denies abdominal pain, nausea, vomiting, diarrhea.  Tolerating diet without difficulty.    Objective   Vital signs in last 24 hours: Temp:  [97.9 F (36.6 C)-98.7 F (37.1 C)] 98 F (36.7 C) (07/22 0400) Pulse Rate:  [45-82] 58 (07/22 0800) Resp:  [12-23] 16 (07/22 0800) BP: (109-139)/(41-61) 116/41 (07/22 0800) SpO2:  [92 %-100 %] 97 % (07/22 0800) Last BM Date : 08/17/22 Last BM recorded by nurses in past 5 days Stool Type: Type 6 (Mushy consistency with ragged edges) (08/17/2022  9:39 AM)  General:   male in no acute distress  Heart:  Regular rate and rhythm; no murmurs Pulm: Clear anteriorly; no wheezing Abdomen: soft, nondistended, normal bowel sounds in all quadrants. Nontender without guarding. No organomegaly appreciated. Extremities:  No edema Neurologic:  Alert and  oriented x4;  No focal deficits.  Psych:  Cooperative. Normal mood and affect.  Intake/Output from previous day: 07/21 0701 - 07/22 0700 In: 252.7 [I.V.:252.7] Out: 900 [Urine:900] Intake/Output this shift: No intake/output data recorded.  Studies/Results: No results found.  Lab Results: Recent Labs    08/15/22 1925 08/16/22 0315 08/17/22 2327 08/18/22 0605 08/18/22 1208  WBC 4.3  --   --  6.4  --   HGB 6.1*   < > 7.6* 7.4* 7.8*  HCT 20.1*   < > 23.7* 22.7* 24.3*  PLT 135*  --   --  141*  --    < > = values in this interval not displayed.   BMET Recent Labs    08/16/22 1524 08/17/22 0528 08/18/22 0605  NA 140 139 140  K 4.1 3.9 3.9  CL 109 113* 113*  CO2 22 20* 24  GLUCOSE 99 88 92  BUN 30* 29* 24*  CREATININE 1.62* 1.53* 1.47*  CALCIUM 8.4* 8.5* 8.5*   LFT No results for input(s): "PROT", "ALBUMIN", "AST", "ALT", "ALKPHOS", "BILITOT", "BILIDIR", "IBILI" in the last 72 hours. PT/INR Recent Labs    08/18/22 0605  LABPROT 14.8  INR 1.1      Scheduled Meds:  cholecalciferol  5,000 Units Oral q morning   levothyroxine  150 mcg Oral QAC breakfast   [START ON 08/19/2022] pantoprazole  40 mg Intravenous Q12H   sucralfate  1 g Oral TID WC & HS   Continuous Infusions:  ferric gluconate (FERRLECIT) IVPB     pantoprazole 8 mg/hr (08/18/22 0941)      Patient profile:   89 year old male with history of A-fib, CAD, stage III CKD, ampullary carcinoma s/p radiation, GAVE, radiation-induced gastritis presented from rehab due to recurrent decline in hemoglobin   Impression/Plan   Anemia secondary to chronic GI blood loss from GAVE/radiation-induced gastroduodenitis and small bowel AVM Hgb 7.4 (7.6) Platelets 141 BUN 24, Cr. 1.47 No overt bleeding, hemoglobin stable -Continue daily CBC and transfuse as needed to maintain HGB > 7  -Would avoid repeat EGD unless active signs of bleeding -Continue diet as tolerated -Consider discharge possibly tomorrow if no further signs of bleeding and hemoglobin remained stable   Haileigh Pitz M Baljit Liebert  08/18/2022, 2:38 PM

## 2022-08-18 NOTE — TOC Initial Note (Signed)
Transition of Care [facility]) - Initial/Assessment Note    Patient Details  Name: Carl Woods MRN: [id number] Date of Birth: 13-Dec-1931  Transition of Care [facility]) CM/SW Contact:    Mearl Latin, LCSW Phone Number: 08/18/2022, 5:31 PM  Clinical Narrative:                 Patient admitted from [facility] where he was undergoing rehab. He has been in copay status. CSW will continue to follow.   Expected Discharge Plan: Skilled Nursing Facility Barriers to Discharge: Continued Medical Work up, English as a second language teacher   Patient Goals and CMS Choice            Expected Discharge Plan and Services In-house Referral: Clinical Social Work                                            Prior Living Arrangements/Services   Lives with:: Self Patient language and need for interpreter reviewed:: Yes        Need for Family Participation in Patient Care: Yes (Comment) Care giver support system in place?: Yes (comment)   Criminal Activity/Legal Involvement Pertinent to Current Situation/Hospitalization: No - Comment as needed  Activities of Daily Living   ADL Screening (condition at time of admission) Patient's cognitive ability adequate to safely complete daily activities?: Yes Is the patient deaf or have difficulty hearing?: No Does the patient have difficulty seeing, even when wearing glasses/contacts?: No Does the patient have difficulty concentrating, remembering, or making decisions?: Yes Patient able to express need for assistance with ADLs?: No Does the patient have difficulty dressing or bathing?: Yes Independently performs ADLs?: Yes (appropriate for developmental age) Does the patient have difficulty walking or climbing stairs?: Yes Weakness of Legs: Both Weakness of Arms/Hands: Both  Permission Sought/Granted Permission sought to share information with : Photographer granted to share info w AGENCY: [facility]        Emotional  Assessment       Orientation: : Oriented to Self, Oriented to Place, Oriented to  Time, Oriented to Situation Alcohol / Substance Use: Not Applicable Psych Involvement: No (comment)  Admission diagnosis:  Melena [K92.1] Gastrointestinal hemorrhage with melena [K92.1] GI bleed [K92.2] Patient Active Problem List   Diagnosis Date Noted   GI bleed 08/17/2022   CKD (chronic kidney disease) stage 3, GFR 30-59 ml/min (HCC) 08/16/2022   Chronic heart failure with preserved ejection fraction (HFpEF) (HCC) 08/16/2022   Gastrointestinal hemorrhage with melena 08/15/2022   Need for emotional support 08/03/2022   Melena 08/02/2022   AVM (arteriovenous malformation) of small bowel, acquired with hemorrhage 08/02/2022   GAVE (gastric antral vascular ectasia) 08/02/2022   Goals of care, counseling/discussion 08/02/2022   Counseling and coordination of care 08/02/2022   Palliative care encounter 08/02/2022   Acute encephalopathy 08/01/2022   GI bleeding 08/01/2022   Gastritis and gastroduodenitis 07/09/2022   Gastric ulcer without hemorrhage or perforation 07/09/2022   Acute blood loss anemia 07/09/2022   Acute heart failure with preserved ejection fraction (HFpEF) (HCC) 07/07/2022   Preprocedural cardiovascular examination 07/07/2022   Symptomatic anemia 07/06/2022   Non-ST elevation (NSTEMI) myocardial infarction (HCC) 07/06/2022   Elevated brain natriuretic peptide (BNP) level 07/06/2022   Pleural effusion 07/06/2022   Hypokalemia 07/06/2022   Hypothyroidism 07/06/2022   GERD (gastroesophageal reflux disease) 07/06/2022   Shortness  of breath 07/06/2022   Hypervolemia 07/06/2022   AKI (acute kidney injury) (HCC) 07/06/2022   Vitamin B12 deficiency anemia 06/16/2022   Cancer of ampulla of Vater (HCC) 01/31/2022   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   Perianal rash 08/06/2017   Atrial fibrillation (HCC) 07/08/2016   Recurrent epistaxis 07/08/2016   Deviated nasal septum 07/08/2016   Cecal  angiodysplasia 05/28/2016   Iron deficiency anemia due to chronic blood loss 04/23/2016   Heme positive stool 04/23/2016   Long term (current) use of anticoagulants 04/23/2016   BARRETTS ESOPHAGUS 04/23/2009   History of colonic polyps 04/23/2009   PCP:  [name], MD Pharmacy:   [facility] - [place], [place] - [address] [place] [address] Phone: [phone] Fax: [phone]     Social Determinants of Health (SDOH) Social History: SDOH Screenings   Food Insecurity: No Food Insecurity (07/08/2022)  Housing: Patient Declined (07/08/2022)  Transportation Needs: Patient Declined (07/08/2022)  Utilities: Patient Declined (07/08/2022)  Depression (PHQ2-9): Low Risk  (02/18/2022)  Tobacco Use: Medium Risk (08/01/2022)   SDOH Interventions:     Readmission Risk Interventions    08/04/2022    3:58 PM 07/11/2022   12:01 PM 07/07/2022    1:52 PM  Readmission Risk Prevention Plan  Transportation Screening Complete  Complete  PCP or Specialist Appt within 3-5 Days Complete  Complete  HRI or Home Care Consult Complete  Complete  Social Work Consult for Recovery Care Planning/Counseling Complete  Complete  Palliative Care Screening Complete Complete Not Applicable  Medication Review Oceanographer) Complete  Complete

## 2022-08-18 NOTE — Consult Note (Signed)
Palliative Care Consult Note                                  Date: 08/18/2022   Patient Name: Carl Woods  DOB: May 22, 1931  MRN: [id number]  Age / Sex: 89 y.o., male  PCP: [name], MD Referring Physician: Starleen Arms, MD  Reason for Consultation: Establishing goals of care  HPI/Patient Profile: 89 y.o. 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 He presented to the ED on 08/15/2022 with fatigue and hemoglobin reportedly 5.4 at the facility. He was admitted with GI bleed.  Palliative Medicine was consulted for goals of care.    Subjective:   I have reviewed medical records including progress notes, labs and imaging, and assessed the patient at bedside. He is alert and currently working with PT.   I spoke with his daughter [name] by phone to discuss diagnosis, prognosis, GOC, disposition, and options.  Patient is known to PMT from previous hospitalizations. I re-introduced Palliative Medicine as specialized medical care for people living with serious illness. It focuses on providing relief from the symptoms and stress of a serious illness.   We discussed patient's current illness and what it means in the larger context of his ongoing co-morbidities. Current clinical status was reviewed.   Created space and opportunity for patient and family to express thoughts and feelings regarding current medical situation. Values and goals of care were attempted to be elicited.  Life Review: Patient is a retired Airline pilot.  He has been married to his wife Okey Regal for 60+ years.  She has dementia and recently transitioned to an ALF.  They have 2 sons Tashawn Debarge and [place]) and 1 daughter [name]).   Goals: Medical stabilization and improvement.  Additional Discussion: We reviewed  patient's medical course over the past few months.  He was hospitalized 07/06/22 through 07/11/22 for GI bleed due to prepyloric ulcer and underwent clipping.  He was  hospitalized again 08/01/22 through 08/08/22 for GI bleed from diffuse gastric GAVE like inflammation; this was treated with APC.   Daughter reports that patient and family understand that GI bleeding and anemia will be a recurrent issue. They understand he will need recurrent blood transfusions.  They are hopeful that this can be managed more proactively moving forward, so that he does not end up hospitalized for every episode of bleeding. The plan is to receive outpatient blood transfusions at the [facility].   Daughter reports that family is aware that patient may not return to his previous baseline. They are considering transition to an ALF at some point.   Daughter expresses significant concern and frustration regarding previous discussions about code status. She reports concern that patient has been "pushed" toward DNR status in prior discussions. I acknowledged her concerns, and also provided reassurance that the role of PMT is to provide education on code status so that patients/families can make an informed decision.  Reviewed that DNR does not change the medical treatment plan and only comes into effect if a person is in cardiac arrest (dies).  Encouraged family to review "hard choices" chapter on CPR, for more information on evidenced-based outcomes of resuscitation efforts in hospitalized patients.   Patient will remain full code at this time, and defers to his children to make a  decision on DNR if his condition were to deteriorate.    Review of Systems  All other systems reviewed and are negative.   Objective:   Primary Diagnoses: Present on Admission:  Acute blood loss anemia  Gastrointestinal hemorrhage with melena  Atrial fibrillation (HCC)  AVM (arteriovenous malformation) of small bowel, acquired with hemorrhage   Cancer of ampulla of Vater (HCC)  CKD (chronic kidney disease) stage 3, GFR 30-59 ml/min (HCC)  Hypothyroidism  Chronic heart failure with preserved ejection fraction (HFpEF) (HCC)  GI bleed   Physical Exam Vitals reviewed.  Constitutional:      Comments: Chronically ill-appearing  Pulmonary:     Effort: Pulmonary effort is normal.  Neurological:     Mental Status: He is alert and oriented to person, place, and time.     Vital Signs:  BP (!) 116/41   Pulse (!) 58   Temp 98 F (36.7 C) (Oral)   Resp 16   Ht 5\' 8"  (1.727 m)   Wt 98 kg   SpO2 97%   BMI 32.85 kg/m   Palliative Assessment/Data: PPS 50%     Assessment & Plan:   SUMMARY OF RECOMMENDATIONS   Full code  Continue full scope care Goal of care is medical stabilization and improvement PMT will continue to follow  Primary Decision Maker: PATIENT  Advanced Directives: HCPOA and living will documents were completed on 07/07/22 (during recent hospitalization), however they were not scanned into ACP/Vynca. I have requested a copy of these documents from family.   Prognosis:  Unable to determine  Discharge Planning:  Current PT recommendation [facility]   Discussed with: Dr. Randol Kern   Thank you for allowing [place] to participate in the care of [name]   MDM - High   Signed by: Sherlean Foot, NP Palliative Medicine Team  Team Phone # [phone]  For individual providers, please see AMION

## 2022-08-18 NOTE — [place] FL2 (Signed)
[place] MEDICAID FL2 LEVEL OF CARE FORM     IDENTIFICATION  Patient Name: Carl Woods Birthdate: 02/26/1931 Sex: male Admission Date (Current Location): 08/15/2022  [facility] and [place] Number:  Producer, television/film/video and Address:  [facility]. [facility], [address] [place], [place] [address]      Provider Number: [id number]  Attending Physician Name and Address:  Elgergawy, Leana Roe, MD  Relative Name and Phone Number:       Current Level of Care: Hospital Recommended Level of Care: Skilled Nursing Facility Prior Approval Number:    Date Approved/Denied:   PASRR Number: [id number]  Discharge Plan: SNF    Current Diagnoses: Patient Active Problem List   Diagnosis Date Noted   GI bleed 08/17/2022   CKD (chronic kidney disease) stage 3, GFR 30-59 ml/min (HCC) 08/16/2022   Chronic heart failure with preserved ejection fraction (HFpEF) (HCC) 08/16/2022   Gastrointestinal hemorrhage with melena 08/15/2022   Need for emotional support 08/03/2022   Melena 08/02/2022   AVM (arteriovenous malformation) of small bowel, acquired with hemorrhage 08/02/2022   GAVE (gastric antral vascular ectasia) 08/02/2022   Goals of care, counseling/discussion 08/02/2022   Counseling and coordination of care 08/02/2022   Palliative care encounter 08/02/2022   Acute encephalopathy 08/01/2022   GI bleeding 08/01/2022   Gastritis and gastroduodenitis 07/09/2022   Gastric ulcer without hemorrhage or perforation 07/09/2022   Acute blood loss anemia 07/09/2022   Acute heart failure with preserved ejection fraction (HFpEF) (HCC) 07/07/2022   Preprocedural cardiovascular examination 07/07/2022   Symptomatic anemia 07/06/2022   Non-ST elevation (NSTEMI) myocardial infarction (HCC) 07/06/2022   Elevated brain natriuretic peptide (BNP) level 07/06/2022   Pleural effusion 07/06/2022   Hypokalemia 07/06/2022   Hypothyroidism 07/06/2022   GERD (gastroesophageal reflux disease)  07/06/2022   Shortness of breath 07/06/2022   Hypervolemia 07/06/2022   AKI (acute kidney injury) (HCC) 07/06/2022   Vitamin B12 deficiency anemia 06/16/2022   Cancer of ampulla of Vater (HCC) 01/31/2022   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   Perianal rash 08/06/2017   Atrial fibrillation (HCC) 07/08/2016   Recurrent epistaxis 07/08/2016   Deviated nasal septum 07/08/2016   Cecal angiodysplasia 05/28/2016   Iron deficiency anemia due to chronic blood loss 04/23/2016   Heme positive stool 04/23/2016   Long term (current) use of anticoagulants 04/23/2016   BARRETTS ESOPHAGUS 04/23/2009   History of colonic polyps 04/23/2009    Orientation RESPIRATION BLADDER Height & Weight     Self, Time, Situation, Place  Normal Continent, External catheter Weight: 216 lb 0.8 oz (98 kg) Height:  5\' 8"  (172.7 cm)  BEHAVIORAL SYMPTOMS/MOOD NEUROLOGICAL BOWEL NUTRITION STATUS      Continent Diet (See dc summary)  AMBULATORY STATUS COMMUNICATION OF NEEDS Skin   Limited Assist Verbally Other (Comment) (skin tear on hand)                       Personal Care Assistance Level of Assistance  Bathing, Feeding, Dressing Bathing Assistance: Limited assistance Feeding assistance: Limited assistance Dressing Assistance: Limited assistance     Functional Limitations Info  Sight Sight Info: Impaired        SPECIAL CARE FACTORS FREQUENCY  PT (By licensed PT), OT (By licensed OT)     PT Frequency: 5x/week OT Frequency: 5x/week            Contractures Contractures Info: Not present    Additional Factors Info  Code Status, Allergies Code Status Info:  Full Allergies Info: Nsaids           Current Medications (08/18/2022):  This is the current hospital active medication list Current Facility-Administered Medications  Medication Dose Route Frequency Provider Last Rate Last Admin   0.9 %  sodium chloride infusion (Manually program via Guardrails IV Fluids)   Intravenous Once Elgergawy, Leana Roe,  MD       acetaminophen (TYLENOL) tablet 650 mg  650 mg Oral Q6H PRN Hillary Bow, DO       Or   acetaminophen (TYLENOL) suppository 650 mg  650 mg Rectal Q6H PRN Hillary Bow, DO       cholecalciferol (VITAMIN D3) 25 MCG (1000 UNIT) tablet 5,000 Units  5,000 Units Oral q morning Hillary Bow, DO   5,000 Units at 08/18/22 1191   ferric gluconate (FERRLECIT) 250 mg in sodium chloride 0.9 % 250 mL IVPB  250 mg Intravenous Daily Elgergawy, Leana Roe, MD 135 mL/hr at 08/18/22 1555 250 mg at 08/18/22 1555   levothyroxine (SYNTHROID) tablet 150 mcg  150 mcg Oral QAC breakfast Hillary Bow, DO   150 mcg at 08/18/22 4782   olopatadine (PATANOL) 0.1 % ophthalmic solution 1 drop  1 drop Both Eyes Daily PRN Hillary Bow, DO       ondansetron [facility]) tablet 4 mg  4 mg Oral Q6H PRN Hillary Bow, DO       Or   ondansetron [facility]) injection 4 mg  4 mg Intravenous Q6H PRN Hillary Bow, DO       [START ON 08/19/2022] pantoprazole (PROTONIX) injection 40 mg  40 mg Intravenous Q12H Julian Reil, Jared M, DO       pantoprozole (PROTONIX) 80 mg /NS 100 mL infusion  8 mg/hr Intravenous Continuous Lyda Perone M, DO 10 mL/hr at 08/18/22 0941 8 mg/hr at 08/18/22 0941   sodium chloride (OCEAN) 0.65 % nasal spray 1 spray  1 spray Each Nare PRN Hillary Bow, DO       sucralfate (CARAFATE) 1 GM/10ML suspension 1 g  1 g Oral TID WC & HS Hillary Bow, DO   1 g at 08/18/22 1257     Discharge Medications: Please see discharge summary for a list of discharge medications.  Relevant Imaging Results:  Relevant Lab Results:   Additional Information ss# [id number]  Mearl Latin, LCSW

## 2022-08-18 NOTE — Progress Notes (Signed)
PROGRESS NOTE    Carl Woods  WJX:[id number] DOB: 1931/05/31 DOA: 08/15/2022 PCP: [name], MD    Chief Complaint  Patient presents with   Fatigue    Brief Narrative:   Carl Woods is a 89 y.o. 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   Recent hospital stay in June for GIB due to prepyloric ulcer s/p clipping.   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  These were treated with APC.  Pt also had bilary stent placed during that admission on 7/10.   Hemoglobin 5 on admission underwent 2 units of PRBC improved to 10.2, decreased to 7.8 and ultimately stabilized for a couple of days.  Was 8.4 on day of discharge 7/12.   Pt in to ED today with fatigue.  HGB at facility was reportedly 5.4   In ED: HGB 6.1   Pt transfused 1u PRBC  Assessment & Plan:   Principal Problem:   Gastrointestinal hemorrhage with melena Active Problems:   Acute blood loss anemia   AVM (arteriovenous malformation) of small bowel, acquired with hemorrhage   Cancer of ampulla of Vater (HCC)   Atrial fibrillation (HCC)   Hypothyroidism   CKD (chronic kidney disease) stage 3, GFR 30-59 ml/min (HCC)   Chronic heart failure with preserved ejection fraction (HFpEF) (HCC)   GI bleed   Gastrointestinal hemorrhage with melena -Pt with recurrent GIBs due to "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" As described by Dr. Myrtie Neither in his 7/7 consult note. Just admitted for same 2 weeks ago.  Now back in to ED with recurrent ABLA and melena. -Monitor CBC closely and transfuse as needed, hemoglobin remains low at 7.6 this morning, will transfuse 1 unit -Management per GI,  will await further recommendations -Continue with IV iron - Continue with procrit   Acute blood loss anemia Please see above discussion   Cancer of ampulla of Vater (HCC) ERCP with stent placed on 7/10.   CKD (chronic kidney disease) stage 3, GFR 30-59 ml/min (HCC) Creat today of 1.7 looks to be similar to his baseline.   Hypothyroidism Cont synthroid   Atrial fibrillation (HCC) Appears to be in rate controlled a.fib today. Not on AC at this time, presumably related to recent GIBs including just 2 weeks ago.    DVT prophylaxis: SCD Code Status: Full Family Communication: None at bedside Disposition:   Status is: Inpatient    Consultants:  GI   Subjective:  No significant events overnight, he denies any complaints  Objective: Vitals:   08/17/22 2000 08/18/22 0000 08/18/22 0400 08/18/22 0800  BP: 139/61 (!) 109/45 (!) 113/58 (!) 116/41  Pulse: 80 70 81 (!) 58  Resp: 19 17 (!) 22 16  Temp: 98.7 F (37.1 C) 97.9 F (36.6 C) 98 F (36.7 C)   TempSrc: Oral Oral Oral   SpO2: 99% 93% 97% 97%  Weight:      Height:        Intake/Output Summary (Last 24 hours) at 08/18/2022 1356 Last data filed at 08/18/2022 0647 Gross per 24 hour  Intake 252.74 ml  Output 900 ml  Net -647.26 ml   Filed Weights   08/15/22 1844  Weight: 98 kg    Examination:  Awake Alert,  Oriented X 3, frail, face asymmetric Symmetrical Chest wall movement, Good air movement bilaterally, CTAB RRR,No Gallops,Rubs or new Murmurs, No Parasternal Heave +ve B.Sounds, Abd Soft, No tenderness, No rebound - guarding or rigidity. No Cyanosis, Clubbing or edema, No new Rash or bruise      Data Reviewed: I have personally reviewed following labs and imaging studies  CBC: Recent Labs  Lab 08/15/22 1925 08/16/22 0315 08/17/22 1109 08/17/22 1729 08/17/22 2327 08/18/22 0605 08/18/22 1208  WBC 4.3  --   --   --   --  6.4  --   NEUTROABS 2.6  --   --   --   --  4.4  --   HGB 6.1*   < > 7.3*  7.9* 7.6* 7.4* 7.8*  HCT 20.1*   < > 22.8* 24.7* 23.7* 22.7* 24.3*  MCV 108.6*  --   --   --   --  100.0  --   PLT 135*  --   --   --   --  141*  --    < > = values in this interval not displayed.    Basic Metabolic Panel: Recent Labs  Lab 08/15/22 1925 08/16/22 1524 08/17/22 0528 08/18/22 0605  NA 138 140 139 140  K 4.1 4.1 3.9 3.9  CL 107 109 113* 113*  CO2 22 22 20* 24  GLUCOSE 117* 99 88 92  BUN 32* 30* 29* 24*  CREATININE 1.70* 1.62* 1.53* 1.47*  CALCIUM 8.7* 8.4* 8.5* 8.5*    GFR: Estimated Creatinine Clearance: 38.6 mL/min (A) (by C-G formula based on SCr of 1.47 mg/dL (H)).  Liver Function Tests: No results for input(s): "AST", "ALT", "ALKPHOS", "BILITOT", "PROT", "ALBUMIN" in the last 168 hours.  CBG: No results for input(s): "GLUCAP" in the last 168 hours.   No results found for this or any previous visit (from the past 240 hour(s)).       Radiology Studies: No results found.      Scheduled Meds:  cholecalciferol  5,000 Units Oral q morning   levothyroxine  150 mcg Oral QAC breakfast   [START ON 08/19/2022] pantoprazole  40 mg Intravenous Q12H   sucralfate  1 g Oral TID WC & HS   Continuous Infusions:  pantoprazole 8 mg/hr (08/18/22 0941)     LOS: 1 day        Huey Bienenstock, MD [facility]   To contact the attending provider between 7A-7P or the covering provider during after hours 7P-7A, please log into the web site [email or web address] and access using universal [place] password for that web site. If you do not have the password, please call the hospital operator.  08/18/2022, 1:56 PM

## 2022-08-18 NOTE — Evaluation (Signed)
Physical Therapy Evaluation Patient Details Name: Carl Woods MRN: [id number] DOB: [birth year] Today's Date: 08/18/2022  History of Present Illness  Patient is 89 y.o. male who presents on 08/16/22 with fatigue and hgb 5.4. 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  Clinical Impression  Pt admitted with above diagnosis. Pt returns with anemia, reports he is feeling better. Pt beginning to consider long term care situation such as ALF and his wife has just been moved into memory care. He would like to be somewhere that he can easily visit her. On eval pt needed min A for bed mobility and ambulation with RW. Ambulated 20' with min A with HR 79 bpm, SPO2 95% and BP 125/64.  Pt currently with functional limitations due to the deficits listed below (see PT Problem List). Pt will benefit from acute skilled PT to increase their independence and safety with mobility to allow discharge.           Assistance Recommended at Discharge Intermittent Supervision/Assistance  If plan is discharge home, recommend the following:  Can travel by private vehicle  Assistance with cooking/housework;Direct supervision/assist for medications management;Assist for transportation;Help with stairs or ramp for entrance;A little help with walking and/or transfers;A little help with bathing/dressing/bathroom   Yes    Equipment Recommendations None recommended by PT  Recommendations for Other Services  OT consult    Functional Status Assessment Patient has had a recent decline in their functional status and demonstrates the ability to make significant improvements in function in a reasonable and predictable amount of time.     Precautions / Restrictions  Precautions Precautions: Fall Restrictions Weight Bearing Restrictions: No      Mobility  Bed Mobility Overal bed mobility: Needs Assistance Bed Mobility: Supine to Sit     Supine to sit: HOB elevated, Min assist Sit to supine: Min guard   General bed mobility comments: min HHA to come to EOB, increased time needed. Pt able to return to supine with increased time    Transfers Overall transfer level: Needs assistance Equipment used: Rolling walker (2 wheels) Transfers: Sit to/from Stand Sit to Stand: Min assist           General transfer comment: verbal cues for hand placement, assist to rise and steady    Ambulation/Gait Ambulation/Gait assistance: Min assist Gait Distance (Feet): 20 Feet Assistive device: Rolling walker (2 wheels) Gait Pattern/deviations: Step-through pattern, Decreased stride length, Trunk flexed Gait velocity: decr Gait velocity interpretation: <1.31 ft/sec, indicative of household ambulator   General Gait Details: cues for RW positioning and posture;pt tolerated distance well per his report though with 2/4 DOE  Careers information officer     Tilt Bed    Modified Rankin (Stroke Patients Only)       Balance Overall balance assessment: Needs assistance, History of Falls Sitting-balance support: Feet supported, No upper extremity supported Sitting balance-Leahy Scale: Good     Standing balance support: Bilateral upper extremity supported, During functional activity, Reliant on assistive device for balance Standing balance-Leahy Scale: Poor Standing balance comment: reliant on RW                             Pertinent Vitals/Pain Pain Assessment Pain Assessment:  No/denies pain    Home Living Family/patient expects to be discharged to:: Private residence Living Arrangements: Spouse/significant other Available Help at Discharge: Family Type of Home: House Home Access: Stairs to enter   [employer] of Steps: 1+1   Home Layout: One level;Able to live on main level with bedroom/bathroom;Full bath on main level Home Equipment: Shower seat;Grab bars - tub/shower;Rolling Walker (2 wheels);Rollator (4 wheels);Hand held shower head Additional Comments: pt's wife Okey Regal has dementia, just moved to [place]. Pt contemplating them both moving to somewhere where they could be on the same property    Prior Function Prior Level of Function : Needs assist             Mobility Comments: independent before last admission, has been at [facility] since that time and was about to go home ADLs Comments: independent with basic ADLs, not driving     Hand Dominance   Dominant Hand: Left    Extremity/Trunk Assessment   Upper Extremity Assessment Upper Extremity Assessment: Generalized weakness    Lower Extremity Assessment Lower Extremity Assessment: Generalized weakness    Cervical / Trunk Assessment Cervical / Trunk Assessment: Kyphotic  Communication   Communication: No difficulties  Cognition Arousal/Alertness: Awake/alert Behavior During Therapy: WFL for tasks assessed/performed Overall Cognitive Status: Impaired/Different from baseline Area of Impairment: Problem solving                             Problem Solving: Slow processing General Comments: slowed processing since last admission. Has some difficulty relaying timeline of recent events        General Comments General comments (skin integrity, edema, etc.): BP 125/64, HR 79 bpm, SPO2 95% after ambulation    Exercises     Assessment/Plan    PT Assessment Patient needs continued PT services  PT Problem List Decreased strength;Decreased activity tolerance;Decreased balance;Decreased mobility;Decreased knowledge of use of DME;Decreased safety awareness;Decreased knowledge of precautions;Cardiopulmonary status limiting activity       PT Treatment Interventions DME instruction;Gait  training;Functional mobility training;Therapeutic activities;Therapeutic exercise;Balance training;Neuromuscular re-education;Cognitive remediation;Patient/family education    PT Goals (Current goals can be found in the Care Plan section)  Acute Rehab PT Goals Patient Stated Goal: be in the same place as his wife or at least be able to visit easily PT Goal Formulation: With patient/family Time For Goal Achievement: 09/01/22 Potential to Achieve Goals: Fair    Frequency Min 2X/week     Co-evaluation               AM-PAC PT "6 Clicks" Mobility  Outcome Measure Help needed turning from your back to your side while in a flat bed without using bedrails?: None Help needed moving from lying on your back to sitting on the side of a flat bed without using bedrails?: A Little Help needed moving to and from a bed to a chair (including a wheelchair)?: A Little Help needed standing up from a chair using your arms (e.g., wheelchair or bedside chair)?: A Little Help needed to walk in hospital room?: A Little Help needed climbing 3-5 steps with a railing? : A Lot 6 Click Score: 18    End of Session Equipment Utilized During Treatment: Gait belt Activity Tolerance: Patient tolerated treatment well Patient left: in bed;with bed alarm set;with call bell/phone within reach Nurse Communication: Mobility status PT Visit Diagnosis: Muscle weakness (generalized) (M62.81);Difficulty in walking, not elsewhere classified (R26.2)    Time: 5284-1324 PT Time  Calculation (min) (ACUTE ONLY): 26 min   Charges:   PT Evaluation $PT Eval Moderate Complexity: 1 Mod PT Treatments $Gait Training: 8-22 mins PT General Charges $$ ACUTE PT VISIT: 1 Visit         Lyanne Co, PT  Acute Rehab Services Secure chat preferred Office [phone]   Lawana Chambers Jilliana Burkes 08/18/2022, 2:58 PM

## 2022-08-19 DIAGNOSIS — K5520 Angiodysplasia of colon without hemorrhage: Secondary | ICD-10-CM | POA: Diagnosis not present

## 2022-08-19 DIAGNOSIS — D500 Iron deficiency anemia secondary to blood loss (chronic): Secondary | ICD-10-CM | POA: Diagnosis not present

## 2022-08-19 DIAGNOSIS — N186 End stage renal disease: Secondary | ICD-10-CM

## 2022-08-19 DIAGNOSIS — K921 Melena: Secondary | ICD-10-CM | POA: Diagnosis not present

## 2022-08-19 DIAGNOSIS — K2990 Gastroduodenitis, unspecified, without bleeding: Secondary | ICD-10-CM | POA: Diagnosis not present

## 2022-08-19 DIAGNOSIS — K31819 Angiodysplasia of stomach and duodenum without bleeding: Secondary | ICD-10-CM | POA: Diagnosis not present

## 2022-08-19 LAB — BASIC METABOLIC PANEL
Anion gap: 6 (ref 5–15)
BUN: 23 mg/dL (ref 8–23)
CO2: 21 mmol/L — ABNORMAL LOW (ref 22–32)
Calcium: 8.9 mg/dL (ref 8.9–10.3)
Chloride: 110 mmol/L (ref 98–111)
Creatinine, Ser: 1.45 mg/dL — ABNORMAL HIGH (ref 0.61–1.24)
GFR, Estimated: 46 mL/min — ABNORMAL LOW (ref >60)
Glucose, Bld: 105 mg/dL — ABNORMAL HIGH (ref 70–99)
Potassium: 3.8 mmol/L (ref 3.5–5.1)
Sodium: 137 mmol/L (ref 135–145)

## 2022-08-19 LAB — CBC
HCT: 26.7 % — ABNORMAL LOW (ref 39.0–52.0)
Hemoglobin: 8.5 g/dL — ABNORMAL LOW (ref 13.0–17.0)
MCH: 31.3 pg (ref 26.0–34.0)
MCHC: 31.8 g/dL (ref 30.0–36.0)
MCV: 98.2 fL (ref 80.0–100.0)
Platelets: 130 10*3/uL — ABNORMAL LOW (ref 150–400)
RBC: 2.72 MIL/uL — ABNORMAL LOW (ref 4.22–5.81)
RDW: 25.4 % — ABNORMAL HIGH (ref 11.5–15.5)
WBC: 8.4 10*3/uL (ref 4.0–10.5)
nRBC: 0.6 % — ABNORMAL HIGH (ref 0.0–0.2)

## 2022-08-19 LAB — TYPE AND SCREEN: Unit division: 0

## 2022-08-19 LAB — HEMOGLOBIN AND HEMATOCRIT, BLOOD
HCT: 25.2 % — ABNORMAL LOW (ref 39.0–52.0)
Hemoglobin: 8.3 g/dL — ABNORMAL LOW (ref 13.0–17.0)

## 2022-08-19 MED ORDER — SODIUM CHLORIDE 0.9 % IV SOLN
250.0000 mg | Freq: Every day | INTRAVENOUS | Status: AC
  Administered 2022-08-20 – 2022-08-21 (×2): 250 mg via INTRAVENOUS
  Filled 2022-08-19 (×2): qty 20

## 2022-08-19 NOTE — Plan of Care (Signed)
  Problem: Activity: Goal: Risk for activity intolerance will decrease Outcome: Progressing   Problem: Nutrition: Goal: Adequate nutrition will be maintained Outcome: Progressing   Problem: Elimination: Goal: Will not experience complications related to bowel motility Outcome: Progressing   Problem: Elimination: Goal: Will not experience complications related to urinary retention Outcome: Progressing   Problem: Safety: Goal: Ability to remain free from injury will improve Outcome: Progressing   Problem: Skin Integrity: Goal: Risk for impaired skin integrity will decrease Outcome: Progressing

## 2022-08-19 NOTE — TOC Progression Note (Signed)
Transition of Care [facility]) - Progression Note    Patient Details  Name: Carl Woods MRN: [id number] Date of Birth: 09/30/1931  [facility] CM/SW Contact  Mearl Latin, LCSW Phone Number: 08/19/2022, 5:34 PM  Clinical Narrative:    CSW met with patient and son at bedside to discuss return to SNF. They would like to return to [facility] for rehab. CSW initiated insurance process for SNF and PTAR transport.    Expected Discharge Plan: Skilled Nursing Facility Barriers to Discharge: Continued Medical Work up, English as a second language teacher  Expected Discharge Plan and Services In-house Referral: Clinical Social Work   Post Acute Care Choice: Skilled Nursing Facility Living arrangements for the past 2 months: Single Family Home                                       Social Determinants of Health (SDOH) Interventions SDOH Screenings   Food Insecurity: No Food Insecurity (07/08/2022)  Housing: Patient Declined (07/08/2022)  Transportation Needs: Patient Declined (07/08/2022)  Utilities: Patient Declined (07/08/2022)  Depression (PHQ2-9): Low Risk  (02/18/2022)  Tobacco Use: Medium Risk (08/01/2022)    Readmission Risk Interventions    08/04/2022    3:58 PM 07/11/2022   12:01 PM 07/07/2022    1:52 PM  Readmission Risk Prevention Plan  Transportation Screening Complete  Complete  PCP or Specialist Appt within 3-5 Days Complete  Complete  HRI or Home Care Consult Complete  Complete  Social Work Consult for Recovery Care Planning/Counseling Complete  Complete  Palliative Care Screening Complete Complete Not Applicable  Medication Review Oceanographer) Complete  Complete

## 2022-08-19 NOTE — Progress Notes (Signed)
PROGRESS NOTE    Carl Woods  ZOX:[id number] DOB: April 07, 1931 DOA: 08/15/2022 PCP: [name], MD    Chief Complaint  Patient presents with   Fatigue    Brief Narrative:   Carl Woods is a 89 y.o. 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   Recent hospital stay in June for GIB due to prepyloric ulcer s/p clipping.   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  These were treated with APC.  Pt also had bilary stent placed during that admission on 7/10.   Hemoglobin 5 on admission underwent 2 units of PRBC improved to 10.2, decreased to 7.8 and ultimately stabilized for a couple of days.  Was 8.4 on day of discharge 7/12.   Pt in to ED today with fatigue.  HGB at facility was reportedly 5.4 he required PRBC transfusion, seen by GI, recommendation for conservative management, and supportive transfusion.     Assessment & Plan:   Principal Problem:   Gastrointestinal hemorrhage with melena Active Problems:   Acute blood loss anemia   AVM (arteriovenous malformation) of small bowel, acquired with hemorrhage   Cancer of ampulla of Vater (HCC)   Atrial fibrillation (HCC)   Hypothyroidism   CKD (chronic kidney disease) stage 3, GFR 30-59 ml/min (HCC)   Chronic heart failure with preserved ejection fraction (HFpEF) (HCC)   GI bleed   Gastrointestinal hemorrhage with melena -Pt with recurrent GIBs due to "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" As described by Dr. Myrtie Neither in his 7/7 consult note. Just admitted for same 2 weeks ago.  Now back in to ED with recurrent ABLA and melena. -Monitor CBC closely and transfuse as needed, he requires 2  units PRBC transfusion, hemoglobin stable this morning at 8.6.  Continue to monitor CBC and transfuse as needed.   -Management per GI, high risk for sedation for endoscopic procedure, so for now recommendation for supportive transfusion and conservative management unless there is more blood loss or hemodynamic instability unless there is more blood loss or hemodynamic instability.    -Continue with PPI and Carafate -Continue with IV iron, will extend for another 24 hours.  (Hold oral iron not to confuse dark stools from oral iron supplement with actual GI bleed) - Continue with procrit   Acute blood loss anemia Please see above discussion   Cancer of ampulla of Vater (HCC) ERCP with stent placed on 7/10.   CKD (chronic kidney disease) stage 3, GFR 30-59 ml/min (HCC) Creat today of 1.7 looks to be similar to his baseline.   Hypothyroidism Cont synthroid   Atrial fibrillation (HCC) Appears to be in rate controlled a.fib today. Not on AC at this time, presumably related to recent GIBs including just 2 weeks ago.    DVT prophylaxis: SCD Code Status: Full Family Communication: None at bedside Disposition:   Status is: Inpatient    Consultants:  GI   Subjective:  No significant events overnight, he denies any complaints, reports him moved his bowels yesterday, was dark in color.  Objective: Vitals:   08/19/22 0545 08/19/22 0845 08/19/22 1207 08/19/22 1208  BP: (!) 118/50 (!) 116/37 (!) 108/53   Pulse: 82 82 76 76  Resp:   19 17  Temp: 97.6 F (  36.4 C) 98 F (36.7 C) 97.7 F (36.5 C)   TempSrc: Oral Oral Oral   SpO2: 97% 92% 96% 95%  Weight:      Height:        Intake/Output Summary (Last 24 hours) at 08/19/2022 1318 Last data filed at 08/19/2022 0547 Gross per 24 hour  Intake 1313.41 ml  Output 2000 ml  Net -686.59 ml   Filed Weights   08/15/22 1844  Weight: 98 kg    Examination:  Awake Alert, Oriented X 3, frail Symmetrical Chest wall movement, Good air  movement bilaterally, CTAB RRR,No Gallops,Rubs or new Murmurs, No Parasternal Heave +ve B.Sounds, Abd Soft, No tenderness, No rebound - guarding or rigidity. No Cyanosis, Clubbing or edema, No new Rash or bruise      Data Reviewed: I have personally reviewed following labs and imaging studies  CBC: Recent Labs  Lab 08/15/22 1925 08/16/22 0315 08/18/22 0605 08/18/22 1208 08/18/22 2115 08/19/22 0355 08/19/22 1216  WBC 4.3  --  6.4  --   --   --  8.4  NEUTROABS 2.6  --  4.4  --   --   --   --   HGB 6.1*   < > 7.4* 7.8* 8.2* 8.3* 8.5*  HCT 20.1*   < > 22.7* 24.3* 25.4* 25.2* 26.7*  MCV 108.6*  --  100.0  --   --   --  98.2  PLT 135*  --  141*  --   --   --  130*   < > = values in this interval not displayed.    Basic Metabolic Panel: Recent Labs  Lab 08/15/22 1925 08/16/22 1524 08/17/22 0528 08/18/22 0605 08/19/22 1216  NA 138 140 139 140 137  K 4.1 4.1 3.9 3.9 3.8  CL 107 109 113* 113* 110  CO2 22 22 20* 24 21*  GLUCOSE 117* 99 88 92 105*  BUN 32* 30* 29* 24* 23  CREATININE 1.70* 1.62* 1.53* 1.47* 1.45*  CALCIUM 8.7* 8.4* 8.5* 8.5* 8.9    GFR: Estimated Creatinine Clearance: 39.2 mL/min (A) (by C-G formula based on SCr of 1.45 mg/dL (H)).  Liver Function Tests: No results for input(s): "AST", "ALT", "ALKPHOS", "BILITOT", "PROT", "ALBUMIN" in the last 168 hours.  CBG: No results for input(s): "GLUCAP" in the last 168 hours.   No results found for this or any previous visit (from the past 240 hour(s)).       Radiology Studies: No results found.      Scheduled Meds:  cholecalciferol  5,000 Units Oral q morning   levothyroxine  150 mcg Oral QAC breakfast   pantoprazole  40 mg Intravenous Q12H   sucralfate  1 g Oral TID WC & HS   Continuous Infusions:     LOS: 2 days        Huey Bienenstock, MD [facility]   To contact the attending provider between 7A-7P or the covering provider during after hours 7P-7A, please log into the web  site [email or web address] and access using universal [place] password for that web site. If you do not have the password, please call the hospital operator.  08/19/2022, 1:18 PM

## 2022-08-19 NOTE — Progress Notes (Signed)
    Progress Note   LOS: 2 days   Chief Complaint: Anemia   Subjective   Patient reports no further bleeding.  Denies abdominal pain, nausea, vomiting.  States he is scheduled to go back to rehab tomorrow.  Tolerating diet without difficulty    Objective   Vital signs in last 24 hours: Temp:  [97.6 F (36.4 C)-98.1 F (36.7 C)] 97.7 F (36.5 C) (07/23 1207) Pulse Rate:  [73-94] 76 (07/23 1208) Resp:  [17-22] 17 (07/23 1208) BP: (103-128)/(37-67) 108/53 (07/23 1207) SpO2:  [92 %-100 %] 95 % (07/23 1208) Last BM Date : 08/18/22 Last BM recorded by nurses in past 5 days Stool Type: Type 6 (Mushy consistency with ragged edges) (08/18/2022  6:01 PM)  General:   male in no acute distress Heart:  Regular rate and rhythm; no murmurs Pulm: Clear anteriorly; no wheezing Abdomen: soft, nondistended, normal bowel sounds in all quadrants. Nontender without guarding. No organomegaly appreciated. Extremities:  No edema Neurologic:  Alert and  oriented x4;  No focal deficits.  Psych:  Cooperative. Normal mood and affect.  Intake/Output from previous day: 07/22 0701 - 07/23 0700 In: 1313.4 [P.O.:480; I.V.:137.4; Blood:426; IV Piggyback:270] Out: 2000 [Urine:2000] Intake/Output this shift: No intake/output data recorded.  Studies/Results: No results found.  Lab Results: Recent Labs    08/18/22 0605 08/18/22 1208 08/18/22 2115 08/19/22 0355 08/19/22 1216  WBC 6.4  --   --   --  8.4  HGB 7.4*   < > 8.2* 8.3* 8.5*  HCT 22.7*   < > 25.4* 25.2* 26.7*  PLT 141*  --   --   --  130*   < > = values in this interval not displayed.   BMET Recent Labs    08/17/22 0528 08/18/22 0605 08/19/22 1216  NA 139 140 137  K 3.9 3.9 3.8  CL 113* 113* 110  CO2 20* 24 21*  GLUCOSE 88 92 105*  BUN 29* 24* 23  CREATININE 1.53* 1.47* 1.45*  CALCIUM 8.5* 8.5* 8.9   LFT No results for input(s): "PROT", "ALBUMIN", "AST", "ALT", "ALKPHOS", "BILITOT", "BILIDIR", "IBILI" in the last 72  hours. PT/INR Recent Labs    08/18/22 0605  LABPROT 14.8  INR 1.1     Scheduled Meds:  cholecalciferol  5,000 Units Oral q morning   levothyroxine  150 mcg Oral QAC breakfast   pantoprazole  40 mg Intravenous Q12H   sucralfate  1 g Oral TID WC & HS   Continuous Infusions:  [START ON 08/20/2022] ferric gluconate (FERRLECIT) IVPB        Patient profile:   89 year old male with history of A-fib, CAD, stage III CKD, ampullary carcinoma s/p radiation, GAVE, radiation-induced gastritis presented from rehab due to recurrent decline in hemoglobin    Impression/Plan   Anemia secondary to chronic GI blood loss from GAVE/radiation-induced gastroduodenitis and small bowel AVM Hgb 8.5, improving Platelets 130 BUN 23, creatinine 1.45 No overt bleeding, hemoglobin stable. -Continue daily CBC and transfuse as needed to maintain HGB > 7  -Would avoid repeat EGD unless active signs of bleeding -Continue diet as tolerated -No further workup from GI standpoint at this time.  Can be discharged to rehab.  Cancer of ampulla of Vader -S/p ERCP with stent 08/06/2022  CKD  A-fib -Not on anticoagulation  Zayvian Mcmurtry Leanna Sato  08/19/2022, 2:25 PM

## 2022-08-19 NOTE — Plan of Care (Signed)

## 2022-08-20 ENCOUNTER — Ambulatory Visit (HOSPITAL_COMMUNITY): Admit: 2022-08-20 | Payer: PPO | Admitting: Gastroenterology

## 2022-08-20 ENCOUNTER — Encounter: Payer: Self-pay | Admitting: Hematology

## 2022-08-20 ENCOUNTER — Encounter (HOSPITAL_COMMUNITY): Payer: Self-pay

## 2022-08-20 DIAGNOSIS — K921 Melena: Secondary | ICD-10-CM | POA: Diagnosis not present

## 2022-08-20 DIAGNOSIS — K2990 Gastroduodenitis, unspecified, without bleeding: Secondary | ICD-10-CM | POA: Diagnosis not present

## 2022-08-20 DIAGNOSIS — K5520 Angiodysplasia of colon without hemorrhage: Secondary | ICD-10-CM | POA: Diagnosis not present

## 2022-08-20 DIAGNOSIS — K31819 Angiodysplasia of stomach and duodenum without bleeding: Secondary | ICD-10-CM | POA: Diagnosis not present

## 2022-08-20 DIAGNOSIS — D500 Iron deficiency anemia secondary to blood loss (chronic): Secondary | ICD-10-CM | POA: Diagnosis not present

## 2022-08-20 LAB — CBC
HCT: 24.4 % — ABNORMAL LOW (ref 39.0–52.0)
Hemoglobin: 7.7 g/dL — ABNORMAL LOW (ref 13.0–17.0)
MCH: 30.6 pg (ref 26.0–34.0)
MCHC: 31.6 g/dL (ref 30.0–36.0)
MCV: 96.8 fL (ref 80.0–100.0)
Platelets: 97 10*3/uL — ABNORMAL LOW (ref 150–400)
RBC: 2.52 MIL/uL — ABNORMAL LOW (ref 4.22–5.81)
RDW: 25.6 % — ABNORMAL HIGH (ref 11.5–15.5)
WBC: 8 10*3/uL (ref 4.0–10.5)
nRBC: 0.7 % — ABNORMAL HIGH (ref 0.0–0.2)

## 2022-08-20 LAB — TYPE AND SCREEN
ABO/RH(D): A NEG
Antibody Screen: NEGATIVE

## 2022-08-20 LAB — BPAM RBC
ISSUE DATE / TIME: 202407241356
Unit Type and Rh: 600

## 2022-08-20 LAB — PREPARE RBC (CROSSMATCH)

## 2022-08-20 SURGERY — ENDOSCOPIC RETROGRADE CHOLANGIOPANCREATOGRAPHY (ERCP) WITH PROPOFOL
Anesthesia: General

## 2022-08-20 MED ORDER — SODIUM CHLORIDE 0.9 % IV SOLN: INTRAVENOUS | Status: DC

## 2022-08-20 MED ORDER — SODIUM CHLORIDE 0.9% IV SOLUTION: Freq: Once | INTRAVENOUS | Status: AC

## 2022-08-20 NOTE — H&P (View-Only) (Signed)
    Progress Note   LOS: 3 days   Chief Complaint: Hematochezia   Subjective   Patient continues to have melena.  Denies abdominal pain, nausea, vomiting.   Objective   Vital signs in last 24 hours: Temp:  [97.9 F (36.6 C)-99.3 F (37.4 C)] 98 F (36.7 C) (07/24 1137) Pulse Rate:  [71-82] 82 (07/24 0730) Resp:  [17-20] 20 (07/24 1137) BP: (112-132)/(49-65) 115/55 (07/24 1137) SpO2:  [93 %-99 %] 99 % (07/24 0730) Last BM Date : 08/19/22 Last BM recorded by nurses in past 5 days Stool Type: Type 6 (Mushy consistency with ragged edges) (08/20/2022  6:53 AM)  General:   male in no acute distress  Heart:  Regular rate and rhythm; no murmurs Pulm: Clear anteriorly; no wheezing Abdomen: soft, nondistended, normal bowel sounds in all quadrants. Nontender without guarding. No organomegaly appreciated. Extremities:  No edema Neurologic:  Alert and  oriented x4;  No focal deficits.  Psych:  Cooperative. Normal mood and affect.  Intake/Output from previous day: 07/23 0701 - 07/24 0700 In: -  Out: 850 [Urine:850] Intake/Output this shift: Total I/O In: 240 [P.O.:240] Out: 550 [Urine:550]  Studies/Results: No results found.  Lab Results: Recent Labs    08/18/22 0605 08/18/22 1208 08/19/22 0355 08/19/22 1216 08/20/22 0140  WBC 6.4  --   --  8.4 8.0  HGB 7.4*   < > 8.3* 8.5* 7.7*  HCT 22.7*   < > 25.2* 26.7* 24.4*  PLT 141*  --   --  130* 97*   < > = values in this interval not displayed.   BMET Recent Labs    08/18/22 0605 08/19/22 1216  NA 140 137  K 3.9 3.8  CL 113* 110  CO2 24 21*  GLUCOSE 92 105*  BUN 24* 23  CREATININE 1.47* 1.45*  CALCIUM 8.5* 8.9   LFT No results for input(s): "PROT", "ALBUMIN", "AST", "ALT", "ALKPHOS", "BILITOT", "BILIDIR", "IBILI" in the last 72 hours. PT/INR Recent Labs    08/18/22 0605  LABPROT 14.8  INR 1.1     Scheduled Meds:  sodium chloride   Intravenous Once   cholecalciferol  5,000 Units Oral q morning    levothyroxine  150 mcg Oral QAC breakfast   pantoprazole  40 mg Intravenous Q12H   sucralfate  1 g Oral TID WC & HS   Continuous Infusions:  ferric gluconate (FERRLECIT) IVPB 250 mg (08/20/22 0934)      Patient profile:   89 year old male with history of A-fib, CAD, stage III CKD, ampullary carcinoma s/p radiation, GAVE, radiation-induced gastritis presented from rehab due to recurrent decline in hemoglobin    Impression/Plan   Anemia secondary to chronic GI blood loss from GAVE/radiation-induced gastroduodenitis and small bowel AVM Hgb 7.7, worsening Platelets 97 BUN 23, creatinine 1.45 No overt bleeding, hemoglobin stable. -Continue daily CBC and transfuse as needed to maintain HGB > 7  -Plan for EGD tomorrow -I thoroughly discussed the procedure with the patient (at bedside) to include nature of the procedure, alternatives, benefits, and risks (including but not limited to bleeding, infection, perforation, anesthesia/cardiac pulmonary complications).  Patient verbalized understanding and gave verbal consent to proceed with procedure.  -N.p.o. midnight -Continue Protonix   Cancer of ampulla of Vader -S/p ERCP with stent 08/06/2022   CKD   A-fib -Not on anticoagulation   Carl Woods  08/20/2022, 12:18 PM

## 2022-08-20 NOTE — Plan of Care (Signed)
  Problem: Education: Goal: Knowledge of General Education information will improve Description: Including pain rating scale, medication(s)/side effects and non-pharmacologic comfort measures Outcome: Progressing   Problem: Clinical Measurements: Goal: Ability to maintain clinical measurements within normal limits will improve Outcome: Progressing Goal: Will remain free from infection Outcome: Progressing Goal: Diagnostic test results will improve Outcome: Progressing Goal: Cardiovascular complication will be avoided Outcome: Progressing   

## 2022-08-20 NOTE — Progress Notes (Signed)
Physical Therapy Treatment Patient Details Name: Carl Woods MRN: [id number] DOB: 1931/07/03 Today's Date: 08/20/2022   History of Present Illness Patient is 89 y.o. male who presents on 08/16/22 with fatigue and hgb 5.4. 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    PT Comments  Pt received in supine, agreeable to therapy session after eating dinner and with good participation and tolerance for transfer, gait and stair training. Pt quick to fatigue after single step up/down but also able to perform standing box taps to work on hip strengthening and progressed to short household distance gait trial with RW support and chair follow for safety. Pt with episode of bowel incontinence and up in bathroom at end of session, NT notified and preparing to enter room with clean linens at end of session. Pt continues to benefit from PT services to progress toward functional mobility goals, continue to recommend lower intensity post-acute rehab <3 hours/day as pt lives alone and not currently able to stand without lift assist and needing +1-2 external assist for household distance gait task with AD.    Assistance Recommended at Discharge Intermittent Supervision/Assistance  If plan is discharge home, recommend the following:  Can travel by private vehicle    Assistance with cooking/housework;Direct supervision/assist for medications management;Assist for transportation;Help with stairs or ramp for entrance;A little help with walking and/or transfers;A little help with bathing/dressing/bathroom   Yes  Equipment Recommendations  None recommended by PT    Recommendations for Other Services OT consult     Precautions / Restrictions Precautions Precautions:  Fall Precaution Comments: bowel incont. Restrictions Weight Bearing Restrictions: No     Mobility  Bed Mobility Overal bed mobility: Needs Assistance Bed Mobility: Supine to Sit     Supine to sit: Min guard, HOB elevated     General bed mobility comments: Increased time and use of bed rail/HOB up to come to EOB, min guard for line mgmt/safety.    Transfers Overall transfer level: Needs assistance Equipment used: Rolling walker (2 wheels) Transfers: Sit to/from Stand Sit to Stand: Min assist           General transfer comment: from bed height and to BSC<>RW, cues needed for improved body mechanics and unable to stand without light lifting assist    Ambulation/Gait Ambulation/Gait assistance: Min assist, +2 safety/equipment Gait Distance (Feet): 50 Feet Assistive device: Rolling walker (2 wheels) Gait Pattern/deviations: Step-through pattern, Decreased stride length, Trunk flexed Gait velocity: decr     General Gait Details: cues for RW positioning and posture; noisy signal however when reading achieved, SpO2 >95% throughout on RA; HR to 107 bpm with exertion; bowel incontinence during trial so returned to bathroom at end of trial, NT notified.   Stairs Stairs: Yes Stairs assistance: Min assist Stair Management: Step to pattern, Forwards, With walker Number of Stairs: 1 General stair comments: cues for sequencing/safety   Wheelchair Mobility     Tilt Bed    Modified Rankin (Stroke Patients Only)       Balance Overall balance assessment: Needs assistance, History of Falls Sitting-balance support: Feet supported, No upper extremity supported Sitting balance-Leahy Scale: Good     Standing balance support: Bilateral upper extremity supported, During functional activity, Reliant on assistive device for balance Standing balance-Leahy Scale: Fair Standing balance comment: static standing Fair with RW, poor  unsupported                             Cognition Arousal/Alertness: Awake/alert Behavior During Therapy: WFL for tasks assessed/performed Overall Cognitive Status: Impaired/Different from baseline Area of Impairment: Problem solving                             Problem Solving: Slow processing General Comments: Slower processing and pt unaware of bowel incontinence in bed prior to getting up OOB or during gait trial. Pt with some insight into deficits/activity tolerance.        Exercises Other Exercises Other Exercises: standing BLE box taps x5 reps ea on 7" platform    General Comments General comments (skin integrity, edema, etc.): SpO2 WFL on RA (noisy signal but improves when finger relaxed) with exertion, DOE 1-2/4, HR to ~107 bpm with exertion. Bowel incontinence.      Pertinent Vitals/Pain Pain Assessment Pain Assessment: No/denies pain    Home Living                          Prior Function            PT Goals (current goals can now be found in the care plan section) Acute Rehab PT Goals Patient Stated Goal: be in the same place as his wife or at least be able to visit easily PT Goal Formulation: With patient/family Time For Goal Achievement: 09/01/22 Progress towards PT goals: Progressing toward goals    Frequency    Min 2X/week      PT Plan Current plan remains appropriate    Co-evaluation              AM-PAC PT "6 Clicks" Mobility   Outcome Measure  Help needed turning from your back to your side while in a flat bed without using bedrails?: A Little Help needed moving from lying on your back to sitting on the side of a flat bed without using bedrails?: A Lot (without rails/flat bed) Help needed moving to and from a bed to a chair (including a wheelchair)?: A Little Help needed standing up from a chair using your arms (e.g., wheelchair or bedside chair)?: A Little Help needed to walk in hospital room?: A Lot (+2 safety) Help needed climbing 3-5 steps with a  railing? : A Lot 6 Click Score: 15    End of Session Equipment Utilized During Treatment: Gait belt Activity Tolerance: Patient tolerated treatment well;Patient limited by fatigue Patient left: with call bell/phone within reach;in chair;with family/visitor present;Other (comment) (pt in bathroom, son in room also) Nurse Communication: Mobility status;Other (comment) (bowel incontinence, need for clean-up (NT going to room to assist him at end of session)) PT Visit Diagnosis: Muscle weakness (generalized) (M62.81);Difficulty in walking, not elsewhere classified (R26.2)     Time: 1610-9604 PT Time Calculation (min) (ACUTE ONLY): 28 min  Charges:    $Gait Training: 8-22 mins $Therapeutic Activity: 8-22 mins PT General Charges $$ ACUTE PT VISIT: 1 Visit                     Waynesha Rammel P., PTA Acute Rehabilitation Services Secure Chat Preferred 9a-5:30pm Office: [phone]    Dorathy Kinsman [facility] 08/20/2022, 6:41 PM

## 2022-08-20 NOTE — Progress Notes (Signed)
    Progress Note   LOS: 3 days   Chief Complaint: Hematochezia   Subjective   Patient continues to have melena.  Denies abdominal pain, nausea, vomiting.   Objective   Vital signs in last 24 hours: Temp:  [97.9 F (36.6 C)-99.3 F (37.4 C)] 98 F (36.7 C) (07/24 1137) Pulse Rate:  [71-82] 82 (07/24 0730) Resp:  [17-20] 20 (07/24 1137) BP: (112-132)/(49-65) 115/55 (07/24 1137) SpO2:  [93 %-99 %] 99 % (07/24 0730) Last BM Date : 08/19/22 Last BM recorded by nurses in past 5 days Stool Type: Type 6 (Mushy consistency with ragged edges) (08/20/2022  6:53 AM)  General:   male in no acute distress  Heart:  Regular rate and rhythm; no murmurs Pulm: Clear anteriorly; no wheezing Abdomen: soft, nondistended, normal bowel sounds in all quadrants. Nontender without guarding. No organomegaly appreciated. Extremities:  No edema Neurologic:  Alert and  oriented x4;  No focal deficits.  Psych:  Cooperative. Normal mood and affect.  Intake/Output from previous day: 07/23 0701 - 07/24 0700 In: -  Out: 850 [Urine:850] Intake/Output this shift: Total I/O In: 240 [P.O.:240] Out: 550 [Urine:550]  Studies/Results: No results found.  Lab Results: Recent Labs    08/18/22 0605 08/18/22 1208 08/19/22 0355 08/19/22 1216 08/20/22 0140  WBC 6.4  --   --  8.4 8.0  HGB 7.4*   < > 8.3* 8.5* 7.7*  HCT 22.7*   < > 25.2* 26.7* 24.4*  PLT 141*  --   --  130* 97*   < > = values in this interval not displayed.   BMET Recent Labs    08/18/22 0605 08/19/22 1216  NA 140 137  K 3.9 3.8  CL 113* 110  CO2 24 21*  GLUCOSE 92 105*  BUN 24* 23  CREATININE 1.47* 1.45*  CALCIUM 8.5* 8.9   LFT No results for input(s): "PROT", "ALBUMIN", "AST", "ALT", "ALKPHOS", "BILITOT", "BILIDIR", "IBILI" in the last 72 hours. PT/INR Recent Labs    08/18/22 0605  LABPROT 14.8  INR 1.1     Scheduled Meds:  sodium chloride   Intravenous Once   cholecalciferol  5,000 Units Oral q morning    levothyroxine  150 mcg Oral QAC breakfast   pantoprazole  40 mg Intravenous Q12H   sucralfate  1 g Oral TID WC & HS   Continuous Infusions:  ferric gluconate (FERRLECIT) IVPB 250 mg (08/20/22 0934)      Patient profile:   89 year old male with history of A-fib, CAD, stage III CKD, ampullary carcinoma s/p radiation, GAVE, radiation-induced gastritis presented from rehab due to recurrent decline in hemoglobin    Impression/Plan   Anemia secondary to chronic GI blood loss from GAVE/radiation-induced gastroduodenitis and small bowel AVM Hgb 7.7, worsening Platelets 97 BUN 23, creatinine 1.45 No overt bleeding, hemoglobin stable. -Continue daily CBC and transfuse as needed to maintain HGB > 7  -Plan for EGD tomorrow -I thoroughly discussed the procedure with the patient (at bedside) to include nature of the procedure, alternatives, benefits, and risks (including but not limited to bleeding, infection, perforation, anesthesia/cardiac pulmonary complications).  Patient verbalized understanding and gave verbal consent to proceed with procedure.  -N.p.o. midnight -Continue Protonix   Cancer of ampulla of Vader -S/p ERCP with stent 08/06/2022   CKD   A-fib -Not on anticoagulation    Leanna Sato  08/20/2022, 12:18 PM

## 2022-08-20 NOTE — Plan of Care (Signed)

## 2022-08-20 NOTE — TOC Progression Note (Addendum)
Transition of Care [facility]) - Progression Note    Patient Details  Name: Carl Woods MRN: [id number] Date of Birth: 1932-01-21  [facility] CM/SW Contact  Mearl Latin, LCSW Phone Number: 08/20/2022, 9:32 AM  Clinical Narrative:    [facility] Peer has been requested by insurance for SNF. CSW alerted MD and he stated patient is no longer stable for discharge. CSW made insurance aware and will start authorization again once stable. Insurance did warn that patient is likely no longer appropriate for rehab. PTAR auth approved, Auth# [id number].   Expected Discharge Plan: Skilled Nursing Facility Barriers to Discharge: Continued Medical Work up, English as a second language teacher  Expected Discharge Plan and Services In-house Referral: Clinical Social Work   Post Acute Care Choice: Skilled Nursing Facility Living arrangements for the past 2 months: Single Family Home                                       Social Determinants of Health (SDOH) Interventions SDOH Screenings   Food Insecurity: No Food Insecurity (07/08/2022)  Housing: Patient Declined (07/08/2022)  Transportation Needs: Patient Declined (07/08/2022)  Utilities: Patient Declined (07/08/2022)  Depression (PHQ2-9): Low Risk  (02/18/2022)  Tobacco Use: Medium Risk (08/01/2022)    Readmission Risk Interventions    08/04/2022    3:58 PM 07/11/2022   12:01 PM 07/07/2022    1:52 PM  Readmission Risk Prevention Plan  Transportation Screening Complete  Complete  PCP or Specialist Appt within 3-5 Days Complete  Complete  HRI or Home Care Consult Complete  Complete  Social Work Consult for Recovery Care Planning/Counseling Complete  Complete  Palliative Care Screening Complete Complete Not Applicable  Medication Review Oceanographer) Complete  Complete

## 2022-08-20 NOTE — Progress Notes (Signed)
PROGRESS NOTE    Carl Woods  UJW:[id number] DOB: 24-Jun-1931 DOA: 08/15/2022 PCP: [name], MD    Chief Complaint  Patient presents with   Fatigue    Brief Narrative:   Carl Woods is a 89 y.o. 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   Recent hospital stay in June for GIB due to prepyloric ulcer s/p clipping.   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  These were treated with APC.  Pt also had bilary stent placed during that admission on 7/10.   Hemoglobin 5 on admission underwent 2 units of PRBC improved to 10.2, decreased to 7.8 and ultimately stabilized for a couple of days.  Was 8.4 on day of discharge 7/12.   Pt in to ED today with fatigue.  HGB at facility was reportedly 5.4 he required PRBC transfusion, seen by GI, recommendation for conservative management, and supportive transfusion.     Assessment & Plan:   Principal Problem:   Gastrointestinal hemorrhage with melena Active Problems:   Acute blood loss anemia   AVM (arteriovenous malformation) of small bowel, acquired with hemorrhage   Cancer of ampulla of Vater (HCC)   Atrial fibrillation (HCC)   Hypothyroidism   CKD (chronic kidney disease) stage 3, GFR 30-59 ml/min (HCC)   Chronic heart failure with preserved ejection fraction (HFpEF) (HCC)   GI bleed   Gastrointestinal hemorrhage with melena -Pt with recurrent GIBs due to "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" As described by Dr. Myrtie Neither in his 7/7 consult note. Just admitted for same 2 weeks ago.  Now back in to ED with recurrent ABLA and melena. -Monitor CBC closely and transfuse as needed, he requires 2  units PRBC transfusion, hemoglobin stable this morning at 8.6.  Continue to monitor CBC and transfuse as needed.   -Management per GI, high risk for sedation for endoscopic procedure, Initial recommendation for supportive transfusion and conservative management unless there is more blood loss or hemodynamic instability unless there is more blood loss or hemodynamic instability.    -Continue with PPI and Carafate -Continue with IV iron, will extend for 4 doses    -Recieved procrit - Hgb trending down, with melena, will transfuse 1 unit PRBC, GI to evaluate for possible need for EGD.   Acute blood loss anemia Please see above discussion   Cancer of ampulla of Vater (HCC) ERCP with stent placed on 7/10.   CKD (chronic kidney disease) stage 3, GFR 30-59 ml/min (HCC) Creat today of 1.7 looks to be similar to his baseline.   Hypothyroidism Cont synthroid   Atrial fibrillation (HCC) Appears to be in rate controlled a.fib today. Not on AC at this time, presumably related to recent GIBs including just 2 weeks ago.    DVT prophylaxis: SCD Code Status: Full Family Communication: D/W son at bedside Disposition:   Status is: Inpatient    Consultants:  GI   Subjective:  Reports melena overnight  Objective: Vitals:   08/20/22 0730 08/20/22 1137 08/20/22 1403 08/20/22 1424  BP: (!) 117/49 (!) 115/55 115/76 (!) 129/58  Pulse: 82   80  Resp: 20 20 19 20   Temp: 97.9 F (36.6 C) 98 F (36.7 C) 98.5 F (36.9 C) 97.8 F (36.6 C)  TempSrc: Oral Oral Oral   SpO2: 99%  100%   Weight:      Height:        Intake/Output Summary (Last 24 hours) at 08/20/2022 1444 Last data filed at 08/20/2022 1138 Gross per 24 hour  Intake 240 ml  Output 1400 ml  Net -1160 ml   Filed Weights   08/15/22 1844  Weight: 98 kg    Examination:  Awake Alert, Oriented X 3, frail Symmetrical Chest wall movement, Good air movement bilaterally, CTAB RRR,No Gallops,Rubs or new Murmurs, No Parasternal  Heave +ve B.Sounds, Abd Soft, No tenderness, No rebound - guarding or rigidity. No Cyanosis, Clubbing or edema, No new Rash or bruise       Data Reviewed: I have personally reviewed following labs and imaging studies  CBC: Recent Labs  Lab 08/15/22 1925 08/16/22 0315 08/18/22 0605 08/18/22 1208 08/18/22 2115 08/19/22 0355 08/19/22 1216 08/20/22 0140  WBC 4.3  --  6.4  --   --   --  8.4 8.0  NEUTROABS 2.6  --  4.4  --   --   --   --   --   HGB 6.1*   < > 7.4* 7.8* 8.2* 8.3* 8.5* 7.7*  HCT 20.1*   < > 22.7* 24.3* 25.4* 25.2* 26.7* 24.4*  MCV 108.6*  --  100.0  --   --   --  98.2 96.8  PLT 135*  --  141*  --   --   --  130* 97*   < > = values in this interval not displayed.    Basic Metabolic Panel: Recent Labs  Lab 08/15/22 1925 08/16/22 1524 08/17/22 0528 08/18/22 0605 08/19/22 1216  NA 138 140 139 140 137  K 4.1 4.1 3.9 3.9 3.8  CL 107 109 113* 113* 110  CO2 22 22 20* 24 21*  GLUCOSE 117* 99 88 92 105*  BUN 32* 30* 29* 24* 23  CREATININE 1.70* 1.62* 1.53* 1.47* 1.45*  CALCIUM 8.7* 8.4* 8.5* 8.5* 8.9    GFR: Estimated Creatinine Clearance: 39.2 mL/min (A) (by C-G formula based on SCr of 1.45 mg/dL (H)).  Liver Function Tests: No results for input(s): "AST", "ALT", "ALKPHOS", "BILITOT", "PROT", "ALBUMIN" in the last 168 hours.  CBG: No results for input(s): "GLUCAP" in the last 168 hours.   No results found for this or any previous visit (from the past 240 hour(s)).       Radiology Studies: No results found.      Scheduled Meds:  cholecalciferol  5,000 Units Oral q morning   levothyroxine  150 mcg Oral QAC breakfast   pantoprazole  40 mg Intravenous Q12H   sucralfate  1 g Oral TID WC & HS   Continuous Infusions:  ferric gluconate (FERRLECIT) IVPB 250 mg (08/20/22 0934)      LOS: 3 days        Huey Bienenstock, MD [facility]   To contact the attending provider between 7A-7P or the covering provider during after hours  7P-7A, please log into the web site [email or web address] and access using universal  password for that web site. If you do not have the password, please call the hospital operator.  08/20/2022, 2:44 PM

## 2022-08-21 ENCOUNTER — Inpatient Hospital Stay (HOSPITAL_COMMUNITY): Payer: PPO | Admitting: Anesthesiology

## 2022-08-21 ENCOUNTER — Encounter (HOSPITAL_COMMUNITY): Payer: Self-pay | Admitting: Internal Medicine

## 2022-08-21 ENCOUNTER — Encounter (HOSPITAL_COMMUNITY)
Admission: EM | Disposition: A | Discharge status: Skilled Nursing Facility | Payer: Self-pay | Source: Skilled Nursing Facility | Attending: Internal Medicine

## 2022-08-21 ENCOUNTER — Other Ambulatory Visit: Payer: Self-pay

## 2022-08-21 DIAGNOSIS — K2980 Duodenitis without bleeding: Secondary | ICD-10-CM

## 2022-08-21 DIAGNOSIS — I5033 Acute on chronic diastolic (congestive) heart failure: Secondary | ICD-10-CM | POA: Diagnosis not present

## 2022-08-21 DIAGNOSIS — K31811 Angiodysplasia of stomach and duodenum with bleeding: Secondary | ICD-10-CM | POA: Diagnosis not present

## 2022-08-21 DIAGNOSIS — I214 Non-ST elevation (NSTEMI) myocardial infarction: Secondary | ICD-10-CM | POA: Diagnosis not present

## 2022-08-21 DIAGNOSIS — C241 Malignant neoplasm of ampulla of Vater: Secondary | ICD-10-CM | POA: Diagnosis not present

## 2022-08-21 DIAGNOSIS — Z87891 Personal history of nicotine dependence: Secondary | ICD-10-CM

## 2022-08-21 DIAGNOSIS — K921 Melena: Secondary | ICD-10-CM | POA: Diagnosis not present

## 2022-08-21 DIAGNOSIS — K2270 Barrett's esophagus without dysplasia: Secondary | ICD-10-CM

## 2022-08-21 DIAGNOSIS — K5521 Angiodysplasia of colon with hemorrhage: Secondary | ICD-10-CM | POA: Diagnosis not present

## 2022-08-21 DIAGNOSIS — D62 Acute posthemorrhagic anemia: Secondary | ICD-10-CM | POA: Diagnosis not present

## 2022-08-21 DIAGNOSIS — K264 Chronic or unspecified duodenal ulcer with hemorrhage: Secondary | ICD-10-CM

## 2022-08-21 HISTORY — PX: HOT HEMOSTASIS: SHX5433

## 2022-08-21 HISTORY — PX: ESOPHAGOGASTRODUODENOSCOPY: SHX5428

## 2022-08-21 LAB — BASIC METABOLIC PANEL
Anion gap: 8 (ref 5–15)
CO2: 20 mmol/L — ABNORMAL LOW (ref 22–32)
Calcium: 8.5 mg/dL — ABNORMAL LOW (ref 8.9–10.3)
Chloride: 108 mmol/L (ref 98–111)
Creatinine, Ser: 1.44 mg/dL — ABNORMAL HIGH (ref 0.61–1.24)
Glucose, Bld: 96 mg/dL (ref 70–99)
Potassium: 4 mmol/L (ref 3.5–5.1)

## 2022-08-21 LAB — MAGNESIUM: Magnesium: 1.7 mg/dL (ref 1.7–2.4)

## 2022-08-21 LAB — TYPE AND SCREEN: Unit division: 0

## 2022-08-21 LAB — CBC
HCT: 26.1 % — ABNORMAL LOW (ref 39.0–52.0)
Hemoglobin: 8.5 g/dL — ABNORMAL LOW (ref 13.0–17.0)
MCH: 31.5 pg (ref 26.0–34.0)
MCHC: 32.6 g/dL (ref 30.0–36.0)
MCV: 96.7 fL (ref 80.0–100.0)
RBC: 2.7 MIL/uL — ABNORMAL LOW (ref 4.22–5.81)
RDW: 25.6 % — ABNORMAL HIGH (ref 11.5–15.5)
WBC: 8.1 10*3/uL (ref 4.0–10.5)

## 2022-08-21 LAB — BPAM RBC: Blood Product Expiration Date: 202408152359

## 2022-08-21 SURGERY — EGD (ESOPHAGOGASTRODUODENOSCOPY)
Anesthesia: Monitor Anesthesia Care

## 2022-08-21 MED ORDER — PROPOFOL 500 MG/50ML IV EMUL
INTRAVENOUS | Status: DC | PRN
  Administered 2022-08-21: 50 ug/kg/min via INTRAVENOUS

## 2022-08-21 MED ORDER — GLUCAGON HCL RDNA (DIAGNOSTIC) 1 MG IJ SOLR
INTRAMUSCULAR | Status: AC
  Filled 2022-08-21: qty 1

## 2022-08-21 MED ORDER — PHENYLEPHRINE 80 MCG/ML (10ML) SYRINGE FOR IV PUSH (FOR BLOOD PRESSURE SUPPORT)
PREFILLED_SYRINGE | INTRAVENOUS | Status: DC | PRN
  Administered 2022-08-21 (×3): 160 ug via INTRAVENOUS

## 2022-08-21 MED ORDER — PROPOFOL 10 MG/ML IV BOLUS
INTRAVENOUS | Status: DC | PRN
  Administered 2022-08-21: 20 mg via INTRAVENOUS
  Administered 2022-08-21: 40 mg via INTRAVENOUS
  Administered 2022-08-21: 20 mg via INTRAVENOUS

## 2022-08-21 MED ORDER — LACTATED RINGERS IV SOLN: INTRAVENOUS | Status: DC | PRN

## 2022-08-21 MED ORDER — GLUCAGON HCL RDNA (DIAGNOSTIC) 1 MG IJ SOLR
INTRAMUSCULAR | Status: DC | PRN
  Administered 2022-08-21: .25 mg via INTRAVENOUS

## 2022-08-21 NOTE — Consult Note (Signed)
   [name]   [name]) [name]  08/21/2022  Carl Woods 07/04/1931 [id number]  [name] [name] screened the patient remotely at [name] .  Insurance: [employer]   Carl Woods is a 89 y.o. male who is a Primary Care Patient of [name], MD. The patient was screened for 7 day readmission hospitalization with noted high risk score for unplanned readmission risk with 3 admissions in 6 months.  The patient was assessed for potential [facility]) Care Management service needs for post hospital transition for care coordination. Review of patient's electronic medical record reveals patient is being recommended for a skilled nursing facility level of care. .   Plan: [name] on behalf of [name] will continue to follow progress and disposition to asess for post hospital community care coordination/management needs.   Referral request for community care coordination: anticipate patient post hospital needs for community transition is to be met at a skilled nursing facility level of care.  Also, patient can be referred to [name] for care management services as a listed benefit with [employer] noted in [employer]/PING-  showing as currently non engaged in that program currently.   [name] Care Management/Population Health does not replace or interfere with any arrangements made by the Inpatient Transition of Care team.   For questions contact:   Charlesetta Shanks, RN BSN CCM [facility]  [phone] business mobile phone Toll free office [phone]  *Concierge Line  [phone] Fax number: [phone] [email or web address]

## 2022-08-21 NOTE — Plan of Care (Signed)

## 2022-08-21 NOTE — TOC Progression Note (Signed)
Transition of Care [facility]) - Progression Note    Patient Details  Name: Carl Woods MRN: [id number] Date of Birth: August 14, 1931  Transition of Care [facility]) CM/SW Contact  Mearl Latin, LCSW Phone Number: 08/21/2022, 3:39 PM  Clinical Narrative:    CSW met with patient's son and explained initial [employer] denial and that we will attempt authorization again once patient is medically stable. He reported they will want to do an appeal if denied and will also need a private pay quote from [employer].    Expected Discharge Plan: Skilled Nursing Facility Barriers to Discharge: Continued Medical Work up, English as a second language teacher  Expected Discharge Plan and Services In-house Referral: Clinical Social Work   Post Acute Care Choice: Skilled Nursing Facility Living arrangements for the past 2 months: Single Family Home                                       Social Determinants of Health (SDOH) Interventions SDOH Screenings   Food Insecurity: No Food Insecurity (08/21/2022)  Housing: Patient Declined (08/21/2022)  Transportation Needs: Patient Declined (08/21/2022)  Utilities: Patient Declined (08/21/2022)  Depression (PHQ2-9): Low Risk  (02/18/2022)  Tobacco Use: Medium Risk (08/21/2022)    Readmission Risk Interventions    08/04/2022    3:58 PM 07/11/2022   12:01 PM 07/07/2022    1:52 PM  Readmission Risk Prevention Plan  Transportation Screening Complete  Complete  PCP or Specialist Appt within 3-5 Days Complete  Complete  HRI or Home Care Consult Complete  Complete  Social Work Consult for Recovery Care Planning/Counseling Complete  Complete  Palliative Care Screening Complete Complete Not Applicable  Medication Review Oceanographer) Complete  Complete

## 2022-08-21 NOTE — Care Management Important Message (Signed)
Important Message  Patient Details  Name: Carl Woods MRN: [id number] Date of Birth: 09/14/1931   Medicare Important Message Given:  Yes     Dorena Bodo 08/21/2022, 3:01 PM

## 2022-08-21 NOTE — Anesthesia Preprocedure Evaluation (Signed)
Anesthesia Evaluation  Patient identified by MRN, date of birth, ID band Patient awake    Reviewed: Allergy & Precautions, NPO status , Patient's Chart, lab work & pertinent test results  History of Anesthesia Complications Negative for: history of anesthetic complications  Airway Mallampati: III  TM Distance: >3 FB Neck ROM: Full    Dental  (+) Edentulous Upper, Missing,    Pulmonary shortness of breath, sleep apnea and Continuous Positive Airway Pressure Ventilation , former smoker    + decreased breath sounds      Cardiovascular (-) hypertension(-) angina (-) CHF  Rhythm:Regular  1. Left ventricular ejection fraction, by estimation, is 60 to 65%. The  left ventricle has normal function. The left ventricle has no regional  wall motion abnormalities. Left ventricular diastolic function could not  be evaluated. Elevated left atrial  pressure. The E/e' is 16.5.   2. Right ventricular systolic function is low normal. The right  ventricular size is mildly enlarged. There is moderately elevated  pulmonary artery systolic pressure. The estimated right ventricular  systolic pressure is 53.4 mmHg.   3. Right atrial size was dilated.   4. A small pericardial effusion is present. There is no evidence of  cardiac tamponade.   5. The mitral valve is degenerative. Trivial mitral valve regurgitation.  No evidence of mitral stenosis.   6. The aortic valve is grossly normal. Aortic valve regurgitation is not  visualized. Aortic valve sclerosis is present, with no evidence of aortic  valve stenosis.   7. The inferior vena cava is dilated in size with <50% respiratory  variability, suggesting right atrial pressure of 15 mmHg.   8. Rhythm strip during this exam demonstrates atrial fibrillation.     Neuro/Psych neg Seizures  negative psych ROS   GI/Hepatic Neg liver ROS, PUD,GERD  ,,? Gi bleed   Endo/Other  Hypothyroidism     Renal/GU Renal InsufficiencyRenal diseaseLab Results      Component                Value               Date                      CREATININE               1.44 (H)            08/21/2022             Lab Results      Component                Value               Date                      NA                       136                 08/21/2022                K                        4.0                 08/21/2022  CO2                      20 (L)              08/21/2022                GLUCOSE                  96                  08/21/2022                BUN                      25 (H)              08/21/2022                CREATININE               1.44 (H)            08/21/2022                CALCIUM                  8.5 (L)             08/21/2022                GFR                      33.16 (L)           01/15/2022                GFRNONAA                 46 (L)              08/21/2022                Musculoskeletal  (+) Arthritis ,    Abdominal   Peds  Hematology  (+) Blood dyscrasia, anemia Lab Results      Component                Value               Date                      WBC                      8.1                 08/21/2022                HGB                      8.5 (L)             08/21/2022                HCT                      26.1 (L)            08/21/2022                MCV  96.7                08/21/2022                PLT                                          08/21/2022            PLATELET CLUMPS NOTED ON SMEAR, UNABLE TO ESTIMATE    Anesthesia Other Findings   Reproductive/Obstetrics                             Anesthesia Physical Anesthesia Plan  ASA: 3  Anesthesia Plan: MAC   Post-op Pain Management: Minimal or no pain anticipated   Induction: Intravenous  PONV Risk Score and Plan: 1 and Propofol infusion  Airway Management Planned: Nasal Cannula, Natural Airway and Simple Face  Mask  Additional Equipment: None  Intra-op Plan:   Post-operative Plan:   Informed Consent: I have reviewed the patients History and Physical, chart, labs and discussed the procedure including the risks, benefits and alternatives for the proposed anesthesia with the patient or authorized representative who has indicated his/her understanding and acceptance.     Dental advisory given  Plan Discussed with: CRNA  Anesthesia Plan Comments:        Anesthesia Quick Evaluation

## 2022-08-21 NOTE — Interval H&P Note (Signed)
History and Physical Interval Note:  08/21/2022 12:10 PM  Carl Woods  has presented today for surgery, with the diagnosis of Melena, acute blood loss anemia.  The various methods of treatment have been discussed with the patient and family. After consideration of risks, benefits and other options for treatment, the patient has consented to  Procedure(s): ESOPHAGOGASTRODUODENOSCOPY (EGD) (N/A) HOT HEMOSTASIS (ARGON PLASMA COAGULATION/BICAP) (N/A) as a surgical intervention.  The patient's history has been reviewed, patient examined, no change in status, stable for surgery.  I have reviewed the patient's chart and labs.  Questions were answered to the patient's satisfaction.     Jenel Lucks

## 2022-08-21 NOTE — Progress Notes (Signed)
PROGRESS NOTE    Carl Woods  NGE:[id number] DOB: 1931/06/23 DOA: 08/15/2022 PCP: [name], MD    Chief Complaint  Patient presents with   Fatigue    Brief Narrative:   Carl Woods is a 89 y.o. 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   Recent hospital stay in June for GIB due to prepyloric ulcer s/p clipping.   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  These were treated with APC.  Pt also had bilary stent placed during that admission on 7/10.   Hemoglobin 5 on admission underwent 2 units of PRBC improved to 10.2, decreased to 7.8 and ultimately stabilized for a couple of days.  Was 8.4 on day of discharge 7/12.   Pt in to ED today with fatigue.  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     Assessment & Plan:   Principal Problem:   Gastrointestinal hemorrhage with melena Active Problems:   Acute blood loss anemia   AVM (arteriovenous malformation) of small bowel, acquired with hemorrhage   Cancer of ampulla of Vater (HCC)   Atrial fibrillation (HCC)   Hypothyroidism   CKD (chronic kidney disease) stage 3, GFR 30-59 ml/min (HCC)   Chronic heart failure with preserved ejection fraction (HFpEF) (HCC)   GI bleed   Gastrointestinal hemorrhage with melena -Pt with recurrent GIBs due to "Diffuse gastric GAVE-like inflammation and mucosal friability from a combination of cirrhosis and radiation  for the patient's ampullary cancer." As described by Dr. Myrtie Neither in his 7/7 consult note. Just admitted for same 2 weeks ago.  Now back in to ED with recurrent ABLA and melena. 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   CKD (chronic kidney disease) stage 3, GFR 30-59 ml/min (HCC) Creat today of 1.7 looks to be similar to his baseline.   Hypothyroidism Cont synthroid   Atrial fibrillation (HCC) Appears to be in rate controlled a.fib today. Not on AC at this time, presumably related to recent GIBs including just 2 weeks ago.    DVT prophylaxis: SCD Code Status: Full Family Communication: D/W son at bedside Disposition:   Status is: Inpatient    Consultants:  GI   Subjective:  Reports bowel movement yesterday, cannot recall if it was significant for melena or not.  Objective: Vitals:   08/21/22 1218 08/21/22 1223 08/21/22 1228 08/21/22 1236  BP: (!) 107/42 (!) 113/55 104/61 (!) 115/52  Pulse: 60 (!) 59 62 (!) 58  Resp: (!) 24 (!) 22 18 (!) 23  Temp:  TempSrc:      SpO2: 99% 98% 100% 100%  Weight:      Height:        Intake/Output Summary (Last 24 hours) at 08/21/2022 1345 Last data filed at 08/21/2022 1208 Gross per 24 hour  Intake 1319.88 ml  Output 850 ml  Net 469.88 ml   Filed Weights   08/15/22 1844  Weight: 98 kg    Examination:  Awake Alert, Oriented X 3, frail Symmetrical Chest wall movement, Good air movement  bilaterally, CTAB Irregular,No Gallops,Rubs or new Murmurs, No Parasternal Heave +ve B.Sounds, Abd Soft, No tenderness, No rebound - guarding or rigidity. No Cyanosis, Clubbing, right TMA      Data Reviewed: I have personally reviewed following labs and imaging studies  CBC: Recent Labs  Lab 08/15/22 1925 08/16/22 0315 08/18/22 0605 08/18/22 1208 08/18/22 2115 08/19/22 0355 08/19/22 1216 08/20/22 0140 08/21/22 0203  WBC 4.3  --  6.4  --   --   --  8.4 8.0 8.1  NEUTROABS 2.6  --  4.4  --   --   --   --   --   --   HGB 6.1*   < > 7.4*   < > 8.2* 8.3* 8.5* 7.7* 8.5*  HCT 20.1*   < > 22.7*   < > 25.4* 25.2* 26.7* 24.4* 26.1*  MCV 108.6*  --  100.0  --   --   --  98.2 96.8 96.7  PLT 135*  --  141*  --   --   --  130* 97* PLATELET CLUMPS NOTED ON SMEAR, UNABLE TO ESTIMATE   < > = values in this interval not displayed.    Basic Metabolic Panel: Recent Labs  Lab 08/16/22 1524 08/17/22 0528 08/18/22 0605 08/19/22 1216 08/21/22 0203  NA 140 139 140 137 136  K 4.1 3.9 3.9 3.8 4.0  CL 109 113* 113* 110 108  CO2 22 20* 24 21* 20*  GLUCOSE 99 88 92 105* 96  BUN 30* 29* 24* 23 25*  CREATININE 1.62* 1.53* 1.47* 1.45* 1.44*  CALCIUM 8.4* 8.5* 8.5* 8.9 8.5*    GFR: Estimated Creatinine Clearance: 39.5 mL/min (A) (by C-G formula based on SCr of 1.44 mg/dL (H)).  Liver Function Tests: No results for input(s): "AST", "ALT", "ALKPHOS", "BILITOT", "PROT", "ALBUMIN" in the last 168 hours.  CBG: No results for input(s): "GLUCAP" in the last 168 hours.   No results found for this or any previous visit (from the past 240 hour(s)).       Radiology Studies: No results found.      Scheduled Meds:  cholecalciferol  5,000 Units Oral q morning   levothyroxine  150 mcg Oral QAC breakfast   pantoprazole  40 mg Intravenous Q12H   sucralfate  1 g Oral TID WC & HS   Continuous Infusions:  ferric gluconate (FERRLECIT) IVPB 250 mg (08/20/22 0934)      LOS: 4 days         Huey Bienenstock, MD [facility]   To contact the attending provider between 7A-7P or the covering provider during after hours 7P-7A, please log into the web site [email or web address] and access using universal [place] password for that web site. If you do not have the password, please call the hospital operator.  08/21/2022, 1:45 PM

## 2022-08-21 NOTE — Op Note (Signed)
[facility] Patient Name: Carl Woods Procedure Date : 08/21/2022 MRN: [id number] Attending MD: Dub Amis. Tomasa Rand , MD, [id number] Date of Birth: 1931/12/05 CSN: [id number] Age: 89 Admit Type: Inpatient Procedure:                Upper GI endoscopy Indications:              Melena, Recent gastrointestinal bleeding Providers:                Lorin Picket E. Tomasa Rand, MD, Lorenza Evangelist, RN, Harrington Challenger, Technician Referring MD:              Medicines:                Monitored Anesthesia Care Complications:            No immediate complications. Estimated Blood Loss:     Estimated blood loss was minimal. Procedure:                Pre-Anesthesia Assessment:                           - Prior to the procedure, a History and Physical                            was performed, and patient medications and                            allergies were reviewed. The patient's tolerance of                            previous anesthesia was also reviewed. The risks                            and benefits of the procedure and the sedation                            options and risks were discussed with the patient.                            All questions were answered, and informed consent                            was obtained. Prior Anticoagulants: The patient has                            taken no anticoagulant or antiplatelet agents. ASA                            Grade Assessment: III - A patient with severe                            systemic disease. After reviewing the risks and  benefits, the patient was deemed in satisfactory                            condition to undergo the procedure.                           After obtaining informed consent, the endoscope was                            passed under direct vision. Throughout the                            procedure, the patient's blood pressure, pulse, and                             oxygen saturations were monitored continuously. The                            [id number]-[id number] ([id number]) Olympus endoscope was introduced                            through the mouth, and advanced to the second part                            of duodenum. The upper GI endoscopy was                            accomplished without difficulty. The patient                            tolerated the procedure well. Scope In: Scope Out: Findings:      The examined esophagus was normal.      Moderate gastric antral vascular ectasia with bleeding was present in       the gastric antrum. A few areas of focal oozing were noted Coagulation       for hemostasis using argon plasma was successful. Estimated blood loss       was minimal.      Diffuse severe inflammation with hemorrhage characterized by adherent       blood, clots, erythema and friability was found in the duodenal bulb and       in the second portion of the duodenum. Most of the bleeding was       localized to the second portion of the duodenum, surrounding the stent,       but numerous areas of oozing and contact friability were noted in the C-       sweep and bulb. There appeared to be some ulcerated mucosa immediately       surrounding the stent/ampulla, but no adherent clot or visible vessel       was seen. Coagulation for hemostasis using argon plasma was successful.       Estimated blood loss was minimal. There was no active bleeding at the       end of the procedure.      A previously placed metal stent was seen in the ampulla. Impression:               -  Normal esophagus.                           - Gastric antral vascular ectasia with bleeding.                            Treated with argon plasma coagulation (APC).                           - Chronic duodenitis with hemorrhage. Treated with                            argon plasma coagulation (APC). Suspect this is                            related to radiation therapy, with  bleeding                            possibly aggravated by irritation from metal stent.                            Unfortunately, this will likely be an ongoing                            problem without a definitive treatment                           - Metal stent in the duodenum.                           - No specimens collected. Moderate Sedation:      N/A Recommendation:           - Return patient to hospital ward for ongoing care.                           - Full liquid diet today.                           - Continue present medications.                           - Use sucralfate suspension 1 gram PO QID.                           - Continue BID PPI                           - Recheck CBC tomorrow.                           - Recommend transfusing as needed for Hgb<7. Repeat                            ablation therapy can be considered based on  severity of rebleeding/transfusion requirement Procedure Code(s):        --- Professional ---                           [phone], Esophagogastroduodenoscopy, flexible,                            transoral; with control of bleeding, any method Diagnosis Code(s):        --- Professional ---                           K31.811, Angiodysplasia of stomach and duodenum                            with bleeding                           K92.1, Melena (includes Hematochezia)                           K92.2, Gastrointestinal hemorrhage, unspecified CPT copyright 2022 American [employer]. All rights reserved. The codes documented in this report are preliminary and upon coder review may  be revised to meet current compliance requirements. Jamyia Fortune E. Tomasa Rand, MD 08/21/2022 12:25:08 PM This report has been signed electronically. Number of Addenda: 0

## 2022-08-21 NOTE — Progress Notes (Signed)
Physical Therapy Treatment Patient Details Name: Carl Woods MRN: [id number] DOB: 1931-02-01 Today's Date: 08/21/2022   History of Present Illness Patient is 89 y.o. male who presents on 08/16/22 with fatigue and hgb 5.4. 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    PT Comments  Pt received in supine, sleeping initially but easily awoken and agreeable to therapy session with emphasis on seated/standing exercises for strengthening. Pt with small amount of soiling on bed, darker stool color, needing assist with hygiene standing at bedside. Pt performed seated/standing exercises and good tolerance for reciprocal transfers from chair<>RW. Food tray arrived at beginning of session and pt had not eaten yet this date due to procedure in afternoon, so defer longer gait trial as pt eager to eat dinner. Pt up in recliner and chair alarm on for safety at end of session. Pt continues to benefit from PT services to progress toward functional mobility goals.     Assistance Recommended at Discharge Intermittent Supervision/Assistance  If plan is discharge home, recommend the following:  Can travel by private vehicle    Assistance with cooking/housework;Direct supervision/assist for medications management;Assist for transportation;Help with stairs or ramp for entrance;A little help with walking and/or transfers;A little help with bathing/dressing/bathroom   Yes  Equipment Recommendations  None recommended by PT    Recommendations for Other Services OT consult     Precautions / Restrictions Precautions Precautions: Fall Precaution Comments: bowel incont. Restrictions Weight Bearing Restrictions: No     Mobility  Bed Mobility Overal bed mobility: Needs  Assistance Bed Mobility: Supine to Sit, Rolling Rolling: Supervision   Supine to sit: HOB elevated, Min assist     General bed mobility comments: Increased time and use of bed rail/HOB up to come to EOB.    Transfers Overall transfer level: Needs assistance Equipment used: Rolling walker (2 wheels) Transfers: Sit to/from Stand, Bed to chair/wheelchair/BSC Sit to Stand: Min guard   Step pivot transfers: Min guard       General transfer comment: from elevated bed height>RW and RW>chair    Ambulation/Gait                   Stairs             Wheelchair Mobility     Tilt Bed    Modified Rankin (Stroke Patients Only)       Balance Overall balance assessment: Needs assistance, History of Falls Sitting-balance support: Feet supported, No upper extremity supported Sitting balance-Leahy Scale: Good     Standing balance support: Bilateral upper extremity supported, During functional activity, Reliant on assistive device for balance Standing balance-Leahy Scale: Fair Standing balance comment: static standing Fair with RW, poor unsupported                            Cognition Arousal/Alertness: Awake/alert Behavior During Therapy: WFL for tasks assessed/performed Overall Cognitive Status: Impaired/Different from baseline Area of Impairment: Problem solving                             Problem Solving: Slow processing General Comments: Slower processing and pt unaware of bowel incontinence in bed prior to getting up OOB.        Exercises General Exercises -  Lower Extremity Ankle Circles/Pumps: AROM, Both, 15 reps, Supine Long Arc Quad: AROM, Both, 5 reps, Seated Hip Flexion/Marching: AROM, Both, 10 reps, Seated Other Exercises Other Exercises: reciprocal STS x 5 reps    General Comments General comments (skin integrity, edema, etc.): SpO2 WFL on RA (noisy signal but improves when finger relaxed) with exertion, HR 80's bpm  standing at RW.      Pertinent Vitals/Pain Pain Assessment Pain Assessment: No/denies pain    Home Living                          Prior Function            PT Goals (current goals can now be found in the care plan section) Acute Rehab PT Goals Patient Stated Goal: be in the same place as his wife or at least be able to visit easily PT Goal Formulation: With patient/family Time For Goal Achievement: 09/01/22 Progress towards PT goals: Progressing toward goals    Frequency    Min 2X/week      PT Plan Current plan remains appropriate    Co-evaluation              AM-PAC PT "6 Clicks" Mobility   Outcome Measure  Help needed turning from your back to your side while in a flat bed without using bedrails?: A Little Help needed moving from lying on your back to sitting on the side of a flat bed without using bedrails?: A Little Help needed moving to and from a bed to a chair (including a wheelchair)?: A Little Help needed standing up from a chair using your arms (e.g., wheelchair or bedside chair)?: A Little Help needed to walk in hospital room?: A Lot (+2 safety) Help needed climbing 3-5 steps with a railing? : A Lot 6 Click Score: 16    End of Session Equipment Utilized During Treatment: Gait belt Activity Tolerance: Patient tolerated treatment well Patient left: with call bell/phone within reach;in chair;with family/visitor present;Other (comment);with chair alarm set (son present) Nurse Communication: Mobility status PT Visit Diagnosis: Muscle weakness (generalized) (M62.81);Difficulty in walking, not elsewhere classified (R26.2)     Time: 5621-3086 PT Time Calculation (min) (ACUTE ONLY): 19 min  Charges:    $Therapeutic Exercise: 8-22 mins PT General Charges $$ ACUTE PT VISIT: 1 Visit                     Raynelle Fujikawa P., PTA Acute Rehabilitation Services Secure Chat Preferred 9a-5:30pm Office: [phone]    Dorathy Kinsman [facility] 08/21/2022, 6:45  PM

## 2022-08-21 NOTE — Transfer of Care (Signed)
Immediate Anesthesia Transfer of Care Note  Patient: Carl Woods  Procedure(s) Performed: ESOPHAGOGASTRODUODENOSCOPY (EGD) HOT HEMOSTASIS (ARGON PLASMA COAGULATION/BICAP)  Patient Location: Endoscopy Unit  Anesthesia Type:MAC  Level of Consciousness: drowsy and patient cooperative  Airway & Oxygen Therapy: Patient Spontanous Breathing and Patient connected to nasal cannula oxygen  Post-op Assessment: Report given to RN and Post -op Vital signs reviewed and stable  Post vital signs: Reviewed and stable  Last Vitals:  Vitals Value Taken Time  BP 102/49 08/21/22 1210  Temp    Pulse 62 08/21/22 1211  Resp 28 08/21/22 1211  SpO2 96 % 08/21/22 1211  Vitals shown include unfiled device data.  Last Pain:  Vitals:   08/21/22 1025  TempSrc: Tympanic  PainSc: 0-No pain      Patients Stated Pain Goal: 0 (08/18/22 2047)  Complications: No notable events documented.

## 2022-08-22 DIAGNOSIS — C241 Malignant neoplasm of ampulla of Vater: Secondary | ICD-10-CM | POA: Diagnosis not present

## 2022-08-22 DIAGNOSIS — D500 Iron deficiency anemia secondary to blood loss (chronic): Secondary | ICD-10-CM | POA: Diagnosis not present

## 2022-08-22 DIAGNOSIS — D62 Acute posthemorrhagic anemia: Secondary | ICD-10-CM | POA: Diagnosis not present

## 2022-08-22 DIAGNOSIS — K2990 Gastroduodenitis, unspecified, without bleeding: Secondary | ICD-10-CM | POA: Diagnosis not present

## 2022-08-22 DIAGNOSIS — K2980 Duodenitis without bleeding: Secondary | ICD-10-CM | POA: Diagnosis not present

## 2022-08-22 DIAGNOSIS — K5520 Angiodysplasia of colon without hemorrhage: Secondary | ICD-10-CM | POA: Diagnosis not present

## 2022-08-22 DIAGNOSIS — K31819 Angiodysplasia of stomach and duodenum without bleeding: Secondary | ICD-10-CM | POA: Diagnosis not present

## 2022-08-22 DIAGNOSIS — K5521 Angiodysplasia of colon with hemorrhage: Secondary | ICD-10-CM | POA: Diagnosis not present

## 2022-08-22 DIAGNOSIS — K921 Melena: Secondary | ICD-10-CM | POA: Diagnosis not present

## 2022-08-22 DIAGNOSIS — W888XXA Exposure to other ionizing radiation, initial encounter: Secondary | ICD-10-CM

## 2022-08-22 NOTE — Anesthesia Postprocedure Evaluation (Signed)
Anesthesia Post Note  Patient: Carl Woods  Procedure(s) Performed: ESOPHAGOGASTRODUODENOSCOPY (EGD) HOT HEMOSTASIS (ARGON PLASMA COAGULATION/BICAP)     Patient location during evaluation: Endoscopy Anesthesia Type: MAC Level of consciousness: awake and alert Pain management: pain level controlled Vital Signs Assessment: post-procedure vital signs reviewed and stable Respiratory status: spontaneous breathing, nonlabored ventilation, respiratory function stable and patient connected to nasal cannula oxygen Cardiovascular status: stable and blood pressure returned to baseline Postop Assessment: no apparent nausea or vomiting Anesthetic complications: no   No notable events documented.  Last Vitals:  Vitals:   08/22/22 0400 08/22/22 0800  BP: (!) 112/51   Pulse: 71   Resp: 18   Temp: 36.4 C 36.6 C  SpO2: 98%     Last Pain:  Vitals:   08/22/22 0800  TempSrc: Oral  PainSc: 0-No pain                 Laniece Hornbaker

## 2022-08-22 NOTE — Progress Notes (Signed)
PROGRESS NOTE    Carl Woods  UUV:[id number] DOB: 07/23/1931 DOA: 08/15/2022 PCP: [name], MD    Chief Complaint  Patient presents with   Fatigue    Brief Narrative:   Carl Woods is a 89 y.o. 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   Recent hospital stay in June for GIB due to prepyloric ulcer s/p clipping.   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  These were treated with APC.  Pt also had bilary stent placed during that admission on 7/10.   Hemoglobin 5 on admission underwent 2 units of PRBC improved to 10.2, decreased to 7.8 and ultimately stabilized for a couple of days.  Was 8.4 on day of discharge 7/12.   Pt in to ED today with fatigue.  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     Assessment & Plan:   Principal Problem:   Gastrointestinal hemorrhage with melena Active Problems:   Acute blood loss anemia   AVM (arteriovenous malformation) of small bowel, acquired with hemorrhage   Cancer of ampulla of Vater (HCC)   Atrial fibrillation (HCC)   Hypothyroidism   CKD (chronic kidney disease) stage 3, GFR 30-59 ml/min (HCC)   Chronic heart failure with preserved ejection fraction (HFpEF) (HCC)   GI bleed   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 Myrtie Neither in his 7/7 consult note. Just admitted for same 2 weeks ago.  Now back in to ED with recurrent ABLA and melena. 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   CKD (chronic kidney disease) stage 3, GFR 30-59 ml/min (HCC) Creat today of 1.7 looks to be similar to his baseline.   Hypothyroidism Cont synthroid   Atrial fibrillation (HCC) Appears to be in rate controlled a.fib today. Not on AC at this time, presumably related to recent GIBs including just 2 weeks ago.    DVT prophylaxis: SCD Code Status: Full Family Communication: None at bedside today Disposition:   Status is: Inpatient    Consultants:  GI   Subjective:  Had BM yesterday, dark in color, no fresh blood though.  Objective: Vitals:   08/22/22 0000 08/22/22 0400 08/22/22 0800  08/22/22 1242  BP: (!) 123/50 (!) 112/51  (!) 119/52  Pulse: 74 71  74  Resp: 19 18    Temp: 97.9 F (36.6 C) 97.6 F (36.4 C) 97.9 F (36.6 C)   TempSrc: Oral Oral Oral Oral  SpO2: 94% 98%    Weight:      Height:        Intake/Output Summary (Last 24 hours) at 08/22/2022 1350 Last data filed at 08/22/2022 0300 Gross per 24 hour  Intake  20 ml  Output 500 ml  Net -480 ml   Filed Weights   08/15/22 1844  Weight: 98 kg    Examination:  Awake Alert, Oriented X 3, frail,no  deficits, Normal affect Symmetrical Chest wall movement, Good air movement bilaterally, CTAB RRR,No Gallops,Rubs or new Murmurs, No Parasternal Heave +ve B.Sounds, Abd Soft, No tenderness, No rebound - guarding or rigidity. No Cyanosis, Clubbing  Clubbing, right TMA      Data Reviewed: I have personally reviewed following labs and imaging studies  CBC: Recent Labs  Lab 08/15/22 1925 08/16/22 0315 08/18/22 0605 08/18/22 1208 08/19/22 0355 08/19/22 1216 08/20/22 0140 08/21/22 0203 08/22/22 0116  WBC 4.3  --  6.4  --   --  8.4 8.0 8.1 7.0  NEUTROABS 2.6  --  4.4  --   --   --   --   --   --   HGB 6.1*   < > 7.4*   < > 8.3* 8.5* 7.7* 8.5* 7.8*  HCT 20.1*   < > 22.7*   < > 25.2* 26.7* 24.4* 26.1* 24.4*  MCV 108.6*  --  100.0  --   --  98.2 96.8 96.7 96.1  PLT 135*  --  141*  --   --  130* 97* PLATELET CLUMPS NOTED ON SMEAR, UNABLE TO ESTIMATE PLATELET CLUMPS NOTED ON SMEAR, UNABLE TO ESTIMATE   < > = values in this interval not displayed.    Basic Metabolic Panel: Recent Labs  Lab 08/17/22 0528 08/18/22 0605 08/19/22 1216 08/21/22 0203 08/21/22 0247 08/22/22 0116  NA 139 140 137 136  --  139  K 3.9 3.9 3.8 4.0  --  3.9  CL 113* 113* 110 108  --  111  CO2 20* 24 21* 20*  --  20*  GLUCOSE 88 92 105* 96  --  100*  BUN 29* 24* 23 25*  --  31*  CREATININE 1.53* 1.47* 1.45* 1.44*  --  1.33*  CALCIUM 8.5* 8.5* 8.9 8.5*  --  8.6*  MG  --   --   --   --  1.7  --     GFR: Estimated Creatinine Clearance: 42.7 mL/min (A) (by C-G formula based on SCr of 1.33 mg/dL (H)).  Liver Function Tests: No results for input(s): "AST", "ALT", "ALKPHOS", "BILITOT", "PROT", "ALBUMIN" in the last 168 hours.  CBG: No results for input(s): "GLUCAP" in the last 168 hours.   No results found for this or any previous visit (from the past 240  hour(s)).       Radiology Studies: No results found.      Scheduled Meds:  cholecalciferol  5,000 Units Oral q morning   levothyroxine  150 mcg Oral QAC breakfast   pantoprazole  40 mg Intravenous Q12H   sucralfate  1 g Oral TID WC & HS   Continuous Infusions:      LOS: 5 days        Huey Bienenstock, MD [facility]  To contact the attending provider between 7A-7P or the covering provider during after hours 7P-7A, please log into the web site [email or web address] and access using universal [place] password for that web site. If you do not have the password, please call the hospital operator.  08/22/2022, 1:50 PM

## 2022-08-22 NOTE — Plan of Care (Signed)
  Problem: Clinical Measurements: Goal: Ability to maintain clinical measurements within normal limits will improve Outcome: Progressing Goal: Diagnostic test results will improve Outcome: Progressing   Problem: Elimination: Goal: Will not experience complications related to bowel motility Outcome: Progressing   Problem: Pain Managment: Goal: General experience of comfort will improve Outcome: Progressing

## 2022-08-22 NOTE — TOC Progression Note (Signed)
Transition of Care [facility]) - Progression Note    Patient Details  Name: Carl Woods MRN: [id number] Date of Birth: 1931-12-23  [facility] of [facility]) CM/SW Contact  Mearl Latin, LCSW Phone Number: 08/22/2022, 4:26 PM  Clinical Narrative:    CSW spoke with [employer] [facility] and made them aware we can potentially start the insurance authorization tomorrow if HG stable. If Peer to Peer is offered again that may delay discharge until Sunday so [employer] would need DC Summary by 12pm Sunday if approved.    Expected Discharge Plan: Skilled Nursing Facility Barriers to Discharge: Continued Medical Work up, English as a second language teacher  Expected Discharge Plan and Services In-house Referral: Clinical Social Work   Post Acute Care Choice: Skilled Nursing Facility Living arrangements for the past 2 months: Single Family Home                                       Social Determinants of Health (SDOH) Interventions SDOH Screenings   Food Insecurity: No Food Insecurity (08/21/2022)  Housing: Patient Declined (08/21/2022)  Transportation Needs: Patient Declined (08/21/2022)  Utilities: Patient Declined (08/21/2022)  Depression (PHQ2-9): Low Risk  (02/18/2022)  Tobacco Use: Medium Risk (08/21/2022)    Readmission Risk Interventions    08/04/2022    3:58 PM 07/11/2022   12:01 PM 07/07/2022    1:52 PM  Readmission Risk Prevention Plan  Transportation Screening Complete  Complete  PCP or Specialist Appt within 3-5 Days Complete  Complete  HRI or Home Care Consult Complete  Complete  Social Work Consult for Recovery Care Planning/Counseling Complete  Complete  Palliative Care Screening Complete Complete Not Applicable  Medication Review Oceanographer) Complete  Complete

## 2022-08-22 NOTE — Progress Notes (Signed)
    Progress Note   LOS: 5 days   Chief Complaint:GI bleed   Subjective   Patient denies nausea, vomiting, abdominal pain.  Per RN documentation no stool since 7/24 1800.  Patient also feels he has not had a stool in the last 24 hours but he reports being unsure   Objective   Vital signs in last 24 hours: Temp:  [96.9 F (36.1 C)-97.9 F (36.6 C)] 97.9 F (36.6 C) (07/26 0800) Pulse Rate:  [58-74] 71 (07/26 0400) Resp:  [17-24] 18 (07/26 0400) BP: (102-123)/(42-61) 112/51 (07/26 0400) SpO2:  [94 %-100 %] 98 % (07/26 0400) Last BM Date : 08/21/22 Last BM recorded by nurses in past 5 days Stool Type: Type 6 (Mushy consistency with ragged edges) (08/20/2022  6:46 PM)  General:   male in no acute distress Heart:  irregular rhythm, regular rate Pulm: Clear anteriorly; no wheezing Abdomen: soft, nondistended, normal bowel sounds in all quadrants. Nontender without guarding. No organomegaly appreciated. Extremities:  No edema Neurologic:  Alert and  oriented x4;  No focal deficits.  Psych:  Cooperative. Normal mood and affect.  Intake/Output from previous day: 07/25 0701 - 07/26 0700 In: 614.9 [P.O.:20; I.V.:594.9] Out: 500 [Urine:500] Intake/Output this shift: No intake/output data recorded.  Studies/Results: No results found.  Lab Results: Recent Labs    08/20/22 0140 08/21/22 0203 08/22/22 0116  WBC 8.0 8.1 7.0  HGB 7.7* 8.5* 7.8*  HCT 24.4* 26.1* 24.4*  PLT 97* PLATELET CLUMPS NOTED ON SMEAR, UNABLE TO ESTIMATE PLATELET CLUMPS NOTED ON SMEAR, UNABLE TO ESTIMATE   BMET Recent Labs    08/19/22 1216 08/21/22 0203 08/22/22 0116  NA 137 136 139  K 3.8 4.0 3.9  CL 110 108 111  CO2 21* 20* 20*  GLUCOSE 105* 96 100*  BUN 23 25* 31*  CREATININE 1.45* 1.44* 1.33*  CALCIUM 8.9 8.5* 8.6*   LFT No results for input(s): "PROT", "ALBUMIN", "AST", "ALT", "ALKPHOS", "BILITOT", "BILIDIR", "IBILI" in the last 72 hours. PT/INR No results for input(s): "LABPROT", "INR"  in the last 72 hours.   Scheduled Meds:  cholecalciferol  5,000 Units Oral q morning   levothyroxine  150 mcg Oral QAC breakfast   pantoprazole  40 mg Intravenous Q12H   sucralfate  1 g Oral TID WC & HS   Continuous Infusions:    Patient profile:   89 year old male with history of A-fib, CAD, stage III CKD, ampullary carcinoma s/p radiation, GAVE, radiation-induced gastritis presented from rehab due to recurrent decline in hemoglobin    Impression/Plan   Anemia secondary to chronic GI blood loss from GAVE/radiation-induced gastroduodenitis and small bowel AVM Hgb 7.8 (8.5) BUN 31, creatinine 1.33 No overt bleeding, hemoglobin stable. 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  No specimens collected -Continue daily CBC and transfuse as needed to maintain HGB > 7  -Chronic GI blood loss will likely continue to be an issue for patient.  Continue to monitor -Continue PPI twice daily -Continue sulcalfate suspension 1 g p.o. 4 times daily -Can advance to soft diet   Cancer of ampulla of [place] -S/p ERCP with stent 08/06/2022   CKD   A-fib -Not on anticoagulation   Mickey Hebel Leanna Sato  08/22/2022, 9:55 AM

## 2022-08-22 NOTE — Plan of Care (Signed)
Pt alert and oriented x 4. Up to bsc 1 assist. 1 bm this shift. Dark. Vitals stable. No complaints of pain. Last bm 7/25. Malewick in place.  Problem: Education: Goal: Knowledge of General Education information will improve Description: Including pain rating scale, medication(s)/side effects and non-pharmacologic comfort measures Outcome: Progressing   Problem: Health Behavior/Discharge Planning: Goal: Ability to manage health-related needs will improve Outcome: Progressing   Problem: Clinical Measurements: Goal: Ability to maintain clinical measurements within normal limits will improve Outcome: Progressing Goal: Will remain free from infection Outcome: Progressing Goal: Diagnostic test results will improve Outcome: Progressing Goal: Respiratory complications will improve Outcome: Progressing Goal: Cardiovascular complication will be avoided Outcome: Progressing   Problem: Activity: Goal: Risk for activity intolerance will decrease Outcome: Progressing   Problem: Nutrition: Goal: Adequate nutrition will be maintained Outcome: Progressing   Problem: Coping: Goal: Level of anxiety will decrease Outcome: Progressing   Problem: Elimination: Goal: Will not experience complications related to bowel motility Outcome: Progressing Goal: Will not experience complications related to urinary retention Outcome: Progressing   Problem: Pain Managment: Goal: General experience of comfort will improve Outcome: Progressing   Problem: Safety: Goal: Ability to remain free from injury will improve Outcome: Progressing   Problem: Skin Integrity: Goal: Risk for impaired skin integrity will decrease Outcome: Progressing

## 2022-08-23 DIAGNOSIS — K921 Melena: Secondary | ICD-10-CM | POA: Diagnosis not present

## 2022-08-23 DIAGNOSIS — K5521 Angiodysplasia of colon with hemorrhage: Secondary | ICD-10-CM | POA: Diagnosis not present

## 2022-08-23 DIAGNOSIS — D62 Acute posthemorrhagic anemia: Secondary | ICD-10-CM | POA: Diagnosis not present

## 2022-08-23 DIAGNOSIS — C241 Malignant neoplasm of ampulla of Vater: Secondary | ICD-10-CM | POA: Diagnosis not present

## 2022-08-23 LAB — PREPARE RBC (CROSSMATCH)

## 2022-08-23 MED ORDER — SODIUM CHLORIDE 0.9% IV SOLUTION: Freq: Once | INTRAVENOUS | Status: AC

## 2022-08-23 NOTE — Plan of Care (Signed)
  Problem: Education: Goal: Knowledge of General Education information will improve Description: Including pain rating scale, medication(s)/side effects and non-pharmacologic comfort measures Outcome: Progressing   Problem: Health Behavior/Discharge Planning: Goal: Ability to manage health-related needs will improve Outcome: Progressing   Problem: Clinical Measurements: Goal: Ability to maintain clinical measurements within normal limits will improve Outcome: Progressing Goal: Will remain free from infection Outcome: Progressing Goal: Diagnostic test results will improve Outcome: Not Progressing Note: Hgb=7   Problem: Coping: Goal: Level of anxiety will decrease Outcome: Progressing

## 2022-08-23 NOTE — Progress Notes (Signed)
PROGRESS NOTE    Carl Woods  WJX:[id number] DOB: 09/20/1931 DOA: 08/15/2022 PCP: [name], MD    Chief Complaint  Patient presents with   Fatigue    Brief Narrative:   EDAHI KIRTZ is a 89 y.o. 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  -Recent hospital stay in June for GIB due to prepyloric ulcer s/p clipping.  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  These were treated with APC.  Pt also had bilary stent placed during that admission on 7/10. 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 Myrtie Neither in his 7/7 consult note. Just admitted for same 2 weeks ago.  Now back in to ED with recurrent ABLA and melena. 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     Acute blood loss anemia Please see above discussion   Cancer of ampulla of Vater (HCC) ERCP with stent placed on 7/10.   CKD (chronic kidney disease) stage 3, GFR 30-59 ml/min (HCC) Creat today of 1.7 looks to be similar to his baseline.   Hypothyroidism Cont synthroid   Atrial fibrillation (HCC) Appears to be in rate controlled a.fib today. Not on AC at this time, presumably related to recent GIBs including just 2 weeks ago.    DVT prophylaxis: SCD Code Status: Full Family Communication: None at bedside today Disposition:   Status is: Inpatient    Consultants:  GI   Subjective:  BM yesterday, he does not recall the color. Objective: Vitals:   08/23/22 0815 08/23/22 1019 08/23/22 1040 08/23/22 1200  BP: 109/62 (!) 103/57 (!) 109/54 (!) 112/55  Pulse:  80 74 71  Resp: 20  20 18   Temp: 97.7 F (36.5 C) 97.6 F (36.4 C) 97.8 F (36.6 C) 97.9 F (36.6 C)  TempSrc: Oral Oral  Oral  SpO2:  99%  100%  Weight:      Height:        Intake/Output Summary (Last 24 hours) at 08/23/2022 1336 Last data filed at 08/23/2022 0348 Gross per 24 hour  Intake --  Output  700 ml  Net -700 ml   Filed Weights   08/15/22 1844  Weight: 98 kg    Examination:  Awake Alert, Oriented X 3, No new F.N deficits, Normal affect Symmetrical Chest wall movement, Good air movement bilaterally, CTAB RRR,No Gallops,Rubs or new Murmurs, No Parasternal Heave +ve B.Sounds, Abd Soft, No tenderness, No rebound - guarding or rigidity. No Cyanosis, Clubbing or edema, right TMA      Data Reviewed: I have personally reviewed following labs and imaging studies  CBC: Recent Labs  Lab 08/18/22 0605 08/18/22 1208 08/19/22 1216 08/20/22 0140 08/21/22 0203 08/22/22 0116 08/23/22 0212  WBC 6.4  --  8.4 8.0 8.1 7.0 6.4  NEUTROABS 4.4  --   --   --   --   --   --   HGB 7.4*   < > 8.5* 7.7* 8.5* 7.8* 7.0*  HCT 22.7*   < > 26.7* 24.4* 26.1* 24.4* 22.2*  MCV 100.0  --  98.2 96.8 96.7 96.1 99.6  PLT 141*  --  130* 97* PLATELET CLUMPS NOTED ON SMEAR, UNABLE TO ESTIMATE PLATELET CLUMPS NOTED ON SMEAR, UNABLE TO ESTIMATE 143*   < > = values in this interval not displayed.    Basic Metabolic Panel: Recent Labs  Lab 08/17/22 0528 08/18/22 0605 08/19/22 1216 08/21/22 0203 08/21/22 0247 08/22/22 0116  NA 139 140 137 136  --  139  K 3.9 3.9 3.8 4.0  --  3.9  CL 113* 113* 110 108  --  111  CO2 20* 24 21* 20*  --  20*  GLUCOSE 88 92 105* 96  --  100*  BUN 29* 24* 23 25*  --  31*  CREATININE 1.53* 1.47* 1.45* 1.44*  --  1.33*  CALCIUM 8.5* 8.5* 8.9 8.5*  --  8.6*  MG  --   --   --   --  1.7  --     GFR: Estimated Creatinine Clearance: 42.7 mL/min (A) (by C-G formula based on SCr of 1.33 mg/dL (H)).  Liver Function Tests: No results for input(s): "AST", "ALT", "ALKPHOS", "BILITOT", "PROT", "ALBUMIN" in the last 168 hours.  CBG: No results for input(s): "GLUCAP" in the last 168 hours.   No results found for this or any previous visit (from the past 240 hour(s)).       Radiology Studies: No results found.      Scheduled Meds:  cholecalciferol  5,000  Units Oral q morning   levothyroxine  150 mcg Oral QAC breakfast   pantoprazole  40 mg Intravenous Q12H   sucralfate  1 g Oral TID WC & HS   Continuous Infusions:      LOS: 6 days        Huey Bienenstock, MD [facility]   To contact the attending provider between 7A-7P or the covering provider during after hours 7P-7A, please log into the web site [email or web address] and access using universal [place] password for that web site. If you do not have the password, please call the hospital operator.  08/23/2022, 1:36 PM

## 2022-08-23 NOTE — Plan of Care (Signed)

## 2022-08-23 NOTE — Progress Notes (Signed)
Patient refused to sit up on the chair tells he doesnot feel like sitting up for now.

## 2022-08-24 ENCOUNTER — Inpatient Hospital Stay (HOSPITAL_COMMUNITY): Payer: PPO

## 2022-08-24 DIAGNOSIS — D62 Acute posthemorrhagic anemia: Secondary | ICD-10-CM | POA: Diagnosis not present

## 2022-08-24 DIAGNOSIS — C241 Malignant neoplasm of ampulla of Vater: Secondary | ICD-10-CM | POA: Diagnosis not present

## 2022-08-24 DIAGNOSIS — K5521 Angiodysplasia of colon with hemorrhage: Secondary | ICD-10-CM | POA: Diagnosis not present

## 2022-08-24 DIAGNOSIS — K921 Melena: Secondary | ICD-10-CM | POA: Diagnosis not present

## 2022-08-24 LAB — CBC
HCT: 25.2 % — ABNORMAL LOW (ref 39.0–52.0)
Hemoglobin: 8.3 g/dL — ABNORMAL LOW (ref 13.0–17.0)
MCH: 31.4 pg (ref 26.0–34.0)
MCHC: 32.9 g/dL (ref 30.0–36.0)
MCV: 95.5 fL (ref 80.0–100.0)
Platelets: 103 10*3/uL — ABNORMAL LOW (ref 150–400)
RBC: 2.64 MIL/uL — ABNORMAL LOW (ref 4.22–5.81)
RDW: 25.2 % — ABNORMAL HIGH (ref 11.5–15.5)
WBC: 5.6 10*3/uL (ref 4.0–10.5)
nRBC: 0.5 % — ABNORMAL HIGH (ref 0.0–0.2)

## 2022-08-24 MED ORDER — PANTOPRAZOLE SODIUM 40 MG PO TBEC
40.0000 mg | DELAYED_RELEASE_TABLET | Freq: Every day | ORAL | Status: DC
  Administered 2022-08-25 – 2022-08-27 (×3): 40 mg via ORAL
  Filled 2022-08-24 (×3): qty 1

## 2022-08-24 NOTE — Plan of Care (Signed)

## 2022-08-24 NOTE — Progress Notes (Signed)
Pt had a brief moment of vision loss this morning around 6:40am.Pt stated he has hard time seeing through both his eyes and its been on and off since 6 weeks.Dr.Crosley made aware.

## 2022-08-24 NOTE — Progress Notes (Signed)
PROGRESS NOTE    Carl Woods  UEA:[id number] DOB: 10/28/1931 DOA: 08/15/2022 PCP: [name], MD    Chief Complaint  Patient presents with   Fatigue    Brief Narrative:   Carl Woods is a 89 y.o. 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  -Recent hospital stay in June for GIB due to prepyloric ulcer s/p clipping.  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  These were treated with APC.  Pt also had bilary stent placed during that admission on 7/10. 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 Myrtie Neither in his 7/7 consult note. Just admitted for same 2 weeks ago.  Now back in to ED with recurrent ABLA and melena. 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 .    Acute blood loss anemia Please see above discussion   Cancer of ampulla of Vater (HCC) ERCP with stent placed on 7/10.   CKD (chronic kidney disease) stage 3, GFR 30-59 ml/min (HCC) Creat today of 1.7 looks to be similar to his baseline.   Hypothyroidism Cont synthroid   Atrial fibrillation (HCC) Appears to be in rate controlled a.fib t Not on AC at this time, presumably related to recent GIBs including just 2 weeks ago. Patient reports some intermittent vision loss, CT head has been obtained immediately with no acute findings, when he was asked again this afternoon, he denies any vision loss, and reports it is at baseline.    DVT prophylaxis: SCD Code Status: Full Family Communication: None at bedside today Disposition:   Status is: Inpatient    Consultants:  GI   Subjective:  Patient report some vision disturbance overnight,  Objective: Vitals:   08/23/22 2354 08/24/22 0400 08/24/22 0820 08/24/22 1200  BP: (!) 115/50 (!) 113/52 (!) 110/59 (!) 107/59  Pulse: 82 88 77 81  Resp: 18 20 14 18   Temp: 98 F (36.7 C) 97.8 F (36.6 C) 98.3 F (36.8 C) 97.7 F (36.5 C)  TempSrc: Oral Oral Oral Oral   SpO2: 94% 97% 98% 100%  Weight:      Height:        Intake/Output Summary (Last 24 hours) at 08/24/2022 1408 Last data filed at 08/23/2022 1816 Gross per 24 hour  Intake 315 ml  Output 700 ml  Net -385 ml   Filed Weights   08/15/22 1844  Weight: 98 kg    Examination:  Awake Alert, Oriented X 3, extremely frail, deconditioned Symmetrical Chest wall movement, Good air movement bilaterally, CTAB RRR,No Gallops,Rubs or new Murmurs, No Parasternal Heave +ve B.Sounds, Abd Soft, No tenderness, No rebound - guarding or rigidity. No Cyanosis, Clubbing or edema, right TMA      Data Reviewed: I have personally reviewed following labs and imaging studies  CBC: Recent Labs  Lab 08/18/22 0605 08/18/22 1208 08/20/22 0140 08/21/22 0203 08/22/22 0116 08/23/22 0212 08/24/22 0134  WBC 6.4   < > 8.0 8.1 7.0 6.4 5.6  NEUTROABS 4.4  --   --   --   --   --   --   HGB 7.4*   < > 7.7* 8.5* 7.8* 7.0* 8.3*  HCT 22.7*   < > 24.4* 26.1* 24.4* 22.2* 25.2*  MCV 100.0   < > 96.8 96.7 96.1 99.6 95.5  PLT 141*   < > 97* PLATELET CLUMPS NOTED ON SMEAR, UNABLE TO ESTIMATE PLATELET CLUMPS NOTED ON SMEAR, UNABLE TO ESTIMATE 143* 103*   < > = values in this interval not displayed.    Basic Metabolic Panel: Recent Labs  Lab 08/18/22 0605 08/19/22 1216 08/21/22 0203 08/21/22 0247 08/22/22 0116  NA 140 137 136  --  139  K 3.9 3.8 4.0  --  3.9  CL 113* 110 108  --  111  CO2 24 21* 20*  --  20*  GLUCOSE 92 105* 96  --  100*  BUN 24* 23 25*  --  31*  CREATININE 1.47* 1.45* 1.44*  --  1.33*  CALCIUM 8.5* 8.9 8.5*  --  8.6*  MG  --   --   --  1.7  --     GFR: Estimated Creatinine Clearance: 42.7 mL/min (A) (by C-G formula based on SCr of 1.33 mg/dL (H)).  Liver Function Tests: No results for input(s): "AST", "ALT", "ALKPHOS", "BILITOT", "PROT", "ALBUMIN" in the last 168 hours.  CBG: No results for input(s): "GLUCAP" in the last 168 hours.   No results found for this or any previous  visit (from the past 240 hour(s)).       Radiology Studies: CT HEAD WO CONTRAST ( )  Result Date: 08/24/2022 CLINICAL DATA:  Provided history: Acute onset of vision problem, atrial fibrillation, not on anticoagulation. EXAM: CT HEAD WITHOUT CONTRAST TECHNIQUE: Contiguous axial images were obtained from the base of the skull through the vertex without intravenous contrast. RADIATION DOSE REDUCTION: This exam was performed according to the departmental dose-optimization program which includes automated exposure control, adjustment of the mA and/or kV according to patient size and/or use of iterative reconstruction technique. COMPARISON:  Brain MRI 08/06/2022.  Head CT 08/01/2022. FINDINGS: Brain: There is no acute intracranial hemorrhage. No demarcated cortical infarct. No extra-axial  fluid collection. No evidence of an intracranial mass. No midline shift. Vascular: No hyperdense vessel.  Atherosclerotic calcifications. Skull: No calvarial fracture or aggressive osseous lesion. Sinuses/Orbits: No mass or acute finding within the imaged orbits. Opacification of a posterior left ethmoid air cell. IMPRESSION: 1. No evidence of an acute intracranial abnormality. 2. Left ethmoid sinus disease, as described. Electronically Signed   By: Jackey Loge D.O.   On: 08/24/2022 11:02        Scheduled Meds:  cholecalciferol  5,000 Units Oral q morning   levothyroxine  150 mcg Oral QAC breakfast   [START ON 08/25/2022] pantoprazole  40 mg Oral Daily   sucralfate  1 g Oral TID WC & HS   Continuous Infusions:      LOS: 7 days        Huey Bienenstock, MD [facility]   To contact the attending provider between 7A-7P or the covering provider during after hours 7P-7A, please log into the web site [email or web address] and access using universal [place] password for that web site. If you do not have the password, please call the hospital operator.  08/24/2022, 2:08 PM

## 2022-08-24 NOTE — Plan of Care (Signed)
  Problem: Education: Goal: Knowledge of General Education information will improve Description: Including pain rating scale, medication(s)/side effects and non-pharmacologic comfort measures Outcome: Progressing   Problem: Health Behavior/Discharge Planning: Goal: Ability to manage health-related needs will improve Outcome: Progressing   Problem: Clinical Measurements: Goal: Ability to maintain clinical measurements within normal limits will improve Outcome: Progressing Goal: Will remain free from infection Outcome: Progressing Goal: Diagnostic test results will improve Outcome: Progressing Goal: Cardiovascular complication will be avoided Outcome: Progressing   Problem: Activity: Goal: Risk for activity intolerance will decrease Outcome: Progressing   Problem: Nutrition: Goal: Adequate nutrition will be maintained Outcome: Progressing   Problem: Coping: Goal: Level of anxiety will decrease Outcome: Progressing   Problem: Safety: Goal: Ability to remain free from injury will improve Outcome: Progressing

## 2022-08-25 ENCOUNTER — Encounter (HOSPITAL_COMMUNITY): Payer: Self-pay | Admitting: Gastroenterology

## 2022-08-25 DIAGNOSIS — W888XXA Exposure to other ionizing radiation, initial encounter: Secondary | ICD-10-CM | POA: Diagnosis not present

## 2022-08-25 DIAGNOSIS — D62 Acute posthemorrhagic anemia: Secondary | ICD-10-CM | POA: Diagnosis not present

## 2022-08-25 DIAGNOSIS — K2980 Duodenitis without bleeding: Secondary | ICD-10-CM | POA: Diagnosis not present

## 2022-08-25 DIAGNOSIS — I509 Heart failure, unspecified: Secondary | ICD-10-CM

## 2022-08-25 DIAGNOSIS — K921 Melena: Secondary | ICD-10-CM | POA: Diagnosis not present

## 2022-08-25 LAB — BPAM RBC
Blood Product Expiration Date: 202408182359
Blood Product Expiration Date: 202408202359
ISSUE DATE / TIME: 202407291009
ISSUE DATE / TIME: 202407291700
Unit Type and Rh: 600
Unit Type and Rh: 600

## 2022-08-25 LAB — HEMOGLOBIN AND HEMATOCRIT, BLOOD
HCT: 29.1 % — ABNORMAL LOW (ref 39.0–52.0)
Hemoglobin: 9.6 g/dL — ABNORMAL LOW (ref 13.0–17.0)

## 2022-08-25 LAB — TYPE AND SCREEN
ABO/RH(D): A NEG
Antibody Screen: NEGATIVE
Unit division: 0
Unit division: 0

## 2022-08-25 LAB — PREPARE RBC (CROSSMATCH)

## 2022-08-25 MED ORDER — SODIUM CHLORIDE 0.9% IV SOLUTION: Freq: Once | INTRAVENOUS | Status: AC

## 2022-08-25 NOTE — Plan of Care (Signed)

## 2022-08-25 NOTE — TOC Progression Note (Signed)
Transition of Care [facility]) - Progression Note    Patient Details  Name: Carl Woods MRN: [id number] Date of Birth: [birth year]  [facility] CM/SW Contact  Mearl Latin, LCSW Phone Number: 08/25/2022, 8:58 AM  Clinical Narrative:    Weekend CSW, Naida Sleight., had started insurance process for SNF. CSW received approval today for [facility], Auth# [id number], effective for 5 business days. PTAR Auth # [id number].   Expected Discharge Plan: Skilled Nursing Facility Barriers to Discharge: Continued Medical Work up  Expected Discharge Plan and Services In-house Referral: Clinical Social Work   Post Acute Care Choice: Skilled Nursing Facility Living arrangements for the past 2 months: Single Family Home                                       Social Determinants of Health (SDOH) Interventions SDOH Screenings   Food Insecurity: No Food Insecurity (08/21/2022)  Housing: Patient Declined (08/21/2022)  Transportation Needs: Patient Declined (08/21/2022)  Utilities: Patient Declined (08/21/2022)  Depression (PHQ2-9): Low Risk  (02/18/2022)  Tobacco Use: Medium Risk (08/21/2022)    Readmission Risk Interventions    08/04/2022    3:58 PM 07/11/2022   12:01 PM 07/07/2022    1:52 PM  Readmission Risk Prevention Plan  Transportation Screening Complete  Complete  PCP or Specialist Appt within 3-5 Days Complete  Complete  HRI or Home Care Consult Complete  Complete  Social Work Consult for Recovery Care Planning/Counseling Complete  Complete  Palliative Care Screening Complete Complete Not Applicable  Medication Review Oceanographer) Complete  Complete

## 2022-08-25 NOTE — Progress Notes (Signed)
Physical Therapy Treatment Patient Details Name: Carl Woods MRN: [id number] DOB: December 19, 1931 Today's Date: 08/25/2022   History of Present Illness Patient is 89 y.o. male who presents on 08/16/22 with fatigue and hgb 5.4. 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    PT Comments  Pt received in supine, lethargic and unable to follow simple commands due to lethargy. Pt did not fully awaken to sternal rub, but not resisting repositioning or awakening more when scooted toward HOB or rolled, pt needing totalA +2 to safely reposition in bed today. Bed placed in chair posture to promote alertness, blinds in room open, family present. Pt heels floated for pressure relief and SCDs on. Note pt pending MRI brain, he may need PT re-eval next session if he remains altered/lethargic as this is a significant decline from previous week's PT sessions. Pt continues to benefit from PT services to progress toward functional mobility goals.     If plan is discharge home, recommend the following: Assistance with cooking/housework;Direct supervision/assist for medications management;Assist for transportation;Help with stairs or ramp for entrance;A little help with walking and/or transfers;A little help with bathing/dressing/bathroom   Can travel by private vehicle     Yes  Equipment Recommendations  None recommended by PT    Recommendations for Other Services OT consult     Precautions / Restrictions Precautions Precautions: Fall Precaution Comments: bowel incont., lethargy increased 7/29 Restrictions Weight Bearing Restrictions: No     Mobility  Bed Mobility Overal bed mobility: Needs Assistance Bed Mobility: Rolling Rolling: Total assist, +2 for physical  assistance         General bed mobility comments: pt too lethargic to follow commands to assist with rolling to his L side, +2 assist from staff using bed pads; no soiling in bed so pt repositioned by scooting to [facility] from bed in trendelenburg using bed pads with totalA also, and bed placed in chair posture with blinds opened to promote alertness.    Transfers                   General transfer comment: pt too lethargic, unable to get him to open eyes even with repositioning/sternal rub    Ambulation/Gait                   Stairs             Wheelchair Mobility     Tilt Bed    Modified Rankin (Stroke Patients Only)       Balance Overall balance assessment: Needs assistance, History of Falls Sitting-balance support: Bilateral upper extremity supported, Feet unsupported Sitting balance-Leahy Scale: Poor Sitting balance - Comments: pt needs pillows under each arm to maintain neutral posture when bed placed in chair posture due to lethargy; poor to zero; defer EOB for pt safety given lethargy.       Standing balance comment: unable; pt too lethargic                            Cognition Arousal/Alertness: Lethargic Behavior During Therapy: WFL for tasks assessed/performed Overall Cognitive Status: Impaired/Different from baseline  General Comments: Difficult to assess today due to pt lethargy, attempted AM and PM and pt remains lethargic both attempts, family present in room states pt seems different over the past day compared to working with PT last Thursday and has been mostly sleeping. Pt did not awaken to sternal rub, RN notified. Per chart review, pt pending MRI due to AMS.        Exercises General Exercises - Lower Extremity Ankle Circles/Pumps: Both, Supine, PROM, 10 reps Long Arc Quad: Both, Seated, PROM, 10 reps (bed chair posture)    General Comments General comments (skin  integrity, edema, etc.): BP 106/50 (69) HR 59 bpm SpO2 100% on RA with bed in chair posture, pt lethargic; pt may need PT re-eval next session if still lethargic given functional decline; note pt pending MRI brain.      Pertinent Vitals/Pain Pain Assessment Pain Assessment: PAINAD Breathing: normal Negative Vocalization: none Facial Expression: smiling or inexpressive Body Language: relaxed Consolability: no need to console PAINAD Score: 0 Pain Intervention(s): Monitored during session, Limited activity within patient's tolerance, Repositioned    Home Living                          Prior Function            PT Goals (current goals can now be found in the care plan section) Acute Rehab PT Goals Patient Stated Goal: be in the same place as his wife or at least be able to visit easily PT Goal Formulation: With patient/family Time For Goal Achievement: 09/01/22 Progress towards PT goals: Not progressing toward goals - comment;PT to reassess next treatment (pending alertness next session may need goal reassessment if still lethargic)    Frequency    Min 2X/week      PT Plan Current plan remains appropriate    Co-evaluation              AM-PAC PT "6 Clicks" Mobility   Outcome Measure  Help needed turning from your back to your side while in a flat bed without using bedrails?: Total Help needed moving from lying on your back to sitting on the side of a flat bed without using bedrails?: Total Help needed moving to and from a bed to a chair (including a wheelchair)?: Total Help needed standing up from a chair using your arms (e.g., wheelchair or bedside chair)?: Total Help needed to walk in hospital room?: Total Help needed climbing 3-5 steps with a railing? : Total 6 Click Score: 6    End of Session   Activity Tolerance: Patient limited by lethargy Patient left: with call bell/phone within reach;with family/visitor present;Other (comment);in bed;with bed  alarm set (heels floated, x2 family in room, bed in chair posture, SCDs donned) Nurse Communication: Mobility status;Other (comment) (pt lethargy despite repositioning) PT Visit Diagnosis: Muscle weakness (generalized) (M62.81);Difficulty in walking, not elsewhere classified (R26.2)     Time: 1610-9604 PT Time Calculation (min) (ACUTE ONLY): 14 min  Charges:    $Therapeutic Activity: 8-22 mins PT General Charges $$ ACUTE PT VISIT: 1 Visit                     Duell Holdren P., PTA Acute Rehabilitation Services Secure Chat Preferred 9a-5:30pm Office: [phone]    Dorathy Kinsman [facility] 08/25/2022, 3:30 PM

## 2022-08-25 NOTE — Progress Notes (Signed)
PT Cancellation Note  Patient Details Name: Carl Woods MRN: [id number] DOB: 22-Jun-1931   Cancelled Treatment:    Reason Eval/Treat Not Completed: (P) Fatigue/lethargy limiting ability to participate (per RN, blood transfusion recently begun and pt appears lethargic/very drowsy, not easily awoken to participate. Defer until later in day after pt receives transfusion to see if pt more alert.) Will continue efforts per PT plan of care as schedule permits.   Meko Masterson M Rowynn Mcweeney 08/25/2022, 12:12 PM

## 2022-08-25 NOTE — Progress Notes (Signed)
PROGRESS NOTE    Carl Woods  QMV:[id number] DOB: 11-30-1931 DOA: 08/15/2022 PCP: [name], MD    Chief Complaint  Patient presents with   Fatigue    Brief Narrative:   Carl Woods is a 89 y.o. 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  -Recent hospital stay in June for GIB due to prepyloric ulcer s/p clipping.  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  These were treated with APC.  Pt also had bilary stent placed during that admission on 7/10. 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 Myrtie Neither in his 7/7 consult note. Just admitted for same 2 weeks ago.  Now back in to ED with recurrent ABLA and melena. 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  Acute blood loss anemia Please see above discussion   Cancer of ampulla of Vater (HCC) ERCP with stent placed on 7/10.   CKD (chronic kidney disease) stage 3, GFR 30-59 ml/min (HCC) Creat today of 1.7 looks to be similar to his baseline.   Hypothyroidism Cont synthroid   Atrial fibrillation (HCC) Appears to be in rate controlled a.fib t Not on AC at this time,related to recent GIBs including just 2 weeks ago. Appears to be more lethargic this morning, and some complaints of vision problem the night before yesterday, (he denies this currently), CT head with no acute findings, will proceed with MRI brain to rule out any ischemic event given he is high risk of stroke given his known A-fib with elevated CHA2DS2-VASc score not on anticoagulation due to GI bleed.     DVT prophylaxis: SCD Code Status: Full Family Communication: None at bedside today, discussed with son  Carl Woods by phone. Disposition:   Status is: Inpatient    Consultants:  GI   Subjective:  Patient unable to provide any complaints this morning Objective: Vitals:   08/25/22 0408 08/25/22 0800 08/25/22 1021 08/25/22 1047  BP: 137/68  (!) 122/51  (!) 121/52  Pulse: 89  64 71  Resp: 20  18 19   Temp: 97.8 F (36.6 C) 97.7 F (36.5 C) 97.9 F (36.6 C) 97.7 F (36.5 C)  TempSrc: Oral Oral Oral Oral  SpO2: 99%  100%   Weight:      Height:        Intake/Output Summary (Last 24 hours) at 08/25/2022 1255 Last data filed at 08/25/2022 1000 Gross per 24 hour  Intake --  Output 1250 ml  Net -1250 ml   Filed Weights   08/15/22 1844  Weight: 98 kg    Examination:  Is more lethargic today, but open I, answering yes/no questions,  Symmetrical Chest wall movement, diminished at the bases RRR,No Gallops,Rubs or new Murmurs, No Parasternal Heave +ve B.Sounds, Abd Soft, No tenderness, No rebound - guarding or rigidity. No Cyanosis, Clubbing or edema, right TMA      Data Reviewed: I have personally reviewed following labs and imaging studies  CBC: Recent Labs  Lab 08/21/22 0203 08/22/22 0116 08/23/22 0212 08/24/22 0134 08/25/22 0625  WBC 8.1 7.0 6.4 5.6 5.3  HGB 8.5* 7.8* 7.0* 8.3* 6.7*  HCT 26.1* 24.4* 22.2* 25.2* 21.4*  MCV 96.7 96.1 99.6 95.5 96.8  PLT PLATELET CLUMPS NOTED ON SMEAR, UNABLE TO ESTIMATE PLATELET CLUMPS NOTED ON SMEAR, UNABLE TO ESTIMATE 143* 103* PLATELET CLUMPS NOTED ON SMEAR, UNABLE TO ESTIMATE    Basic Metabolic Panel: Recent Labs  Lab 08/19/22 1216 08/21/22 0203 08/21/22 0247 08/22/22 0116 08/25/22 0625  NA 137 136  --  139 141  K 3.8 4.0  --  3.9 4.1  CL 110 108  --  111 113*  CO2 21* 20*  --  20* 20*  GLUCOSE 105* 96  --  100* 123*  BUN 23 25*  --  31* 34*  CREATININE 1.45* 1.44*  --  1.33* 1.46*  CALCIUM 8.9 8.5*  --  8.6* 8.4*  MG  --   --  1.7  --   --     GFR: Estimated Creatinine Clearance: 38.9 mL/min (A) (by C-G formula based on SCr of 1.46 mg/dL (H)).  Liver Function Tests: No results for input(s): "AST", "ALT", "ALKPHOS", "BILITOT", "PROT", "ALBUMIN" in the last 168 hours.  CBG: No results for input(s): "GLUCAP" in the last 168 hours.   No results found for this  or any previous visit (from the past 240 hour(s)).       Radiology Studies: CT HEAD WO CONTRAST ( )  Result Date: 08/24/2022 CLINICAL DATA:  Provided history: Acute onset of vision problem, atrial fibrillation, not on anticoagulation. EXAM: CT HEAD WITHOUT CONTRAST TECHNIQUE: Contiguous axial images were obtained from the base of the skull through the vertex without intravenous contrast. RADIATION DOSE REDUCTION: This exam was performed according to the departmental dose-optimization program which includes automated exposure control, adjustment of the mA and/or kV according to patient size and/or use of iterative reconstruction technique. COMPARISON:  Brain MRI 08/06/2022.  Head CT 08/01/2022. FINDINGS: Brain: There is no acute intracranial hemorrhage. No demarcated cortical infarct. No extra-axial fluid collection. No evidence of an intracranial mass. No midline shift. Vascular: No hyperdense vessel.  Atherosclerotic calcifications. Skull: No calvarial fracture or aggressive osseous lesion. Sinuses/Orbits: No  mass or acute finding within the imaged orbits. Opacification of a posterior left ethmoid air cell. IMPRESSION: 1. No evidence of an acute intracranial abnormality. 2. Left ethmoid sinus disease, as described. Electronically Signed   By: Jackey Loge D.O.   On: 08/24/2022 11:02        Scheduled Meds:  cholecalciferol  5,000 Units Oral q morning   levothyroxine  150 mcg Oral QAC breakfast   pantoprazole  40 mg Oral Daily   sucralfate  1 g Oral TID WC & HS   Continuous Infusions:      LOS: 8 days        Huey Bienenstock, MD [facility]   To contact the attending provider between 7A-7P or the covering provider during after hours 7P-7A, please log into the web site [email or web address] and access using universal [place] password for that web site. If you do not have the password, please call the hospital operator.  08/25/2022, 12:55 PM

## 2022-08-25 NOTE — Plan of Care (Signed)

## 2022-08-25 NOTE — Progress Notes (Signed)
Palliative Medicine Progress Note   Patient Name: Carl Woods       Date: 08/25/2022 DOB: [birth year]  Age: 89 y.o. MRN#: [id number] Attending Physician: Starleen Arms, MD Primary Care Physician: [name], MD Admit Date: 08/15/2022   HPI/Patient Profile: 89 y.o. 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 He presented to the ED on 08/15/2022 with fatigue and hemoglobin reportedly 5.4 at the facility. He was admitted with GI bleed.  Palliative Medicine was consulted for goals of care.   Subjective: Chart reviewed. Patient underwent EGD yesterday.   Bedside visit. Patient is sleeping soundly/comfortably and I did not attempt to wake him.   I spoke with daughter [name] by phone.  I reviewed with [name] the progress note from GI from today, which states: "patient will continue to have issues with diffuse oozing/bleeding from his radiation duodenitis, which is likely irritated by the metal biliary stent.  Periodic EGD with APC ablation may be helpful, but patient will probably continue to require frequent blood transfusions.  If patient does show evidence of more brisk bleeding, we can repeat EGD with further attempts at hemostasis.  If bleeding becomes refractory, may consider radiofrequency ablation (may require referral to academic center)."  Daughter expresses appreciation for the update and is glad to know that periodic EGD is still an option.  She has previously reported that patient and family understand that GI bleeding and anemia will be a recurrent issue, and that patient will need frequent blood transfusions.  She expresses some  frustration that transfusion is often not done until hemoglobin is less than 7, as patient is usually symptomatic by that point. I offered education on the evidenced based guidelines for more restrictive transfusion thresholds to reduce the risk of adverse reactions.   For now, daughter confirms the plan is to continue full scope treatment. She reports that neither patient nor family are ready to just stop transfusions at this time. However, she recognizes that patient's health is declining and is starting to contemplate at what point it may be appropriate to shift to more comfort-focused care. Emotional support provided.  Discussed the importance of continued conversation with patient, family, and the medical team regarding overall  plan of care and treatment options.   Objective:  Physical Exam Vitals reviewed.  Constitutional:      General: He is sleeping. He is not in acute distress.    Appearance: He is ill-appearing.  Pulmonary:     Effort: Pulmonary effort is normal.             Vital Signs: BP 137/68 (BP Location: Right Arm)   Pulse 89   Temp 97.8 F (36.6 C) (Oral)   Resp 20   Ht 5\' 8"  (1.727 m)   Wt 98 kg   SpO2 99%   BMI 32.85 kg/m  SpO2: SpO2: 99 % O2 Device: O2 Device: Room Air   Palliative Medicine Assessment & Plan   Assessment: Principal Problem:   Gastrointestinal hemorrhage with melena Active Problems:   Atrial fibrillation (HCC)   Cancer of ampulla of Vater (HCC)   Hypothyroidism   Acute blood loss anemia   AVM (arteriovenous malformation) of small bowel, acquired with hemorrhage   CKD (chronic kidney disease) stage 3, GFR 30-59 ml/min (HCC)   Chronic heart failure with preserved ejection fraction (HFpEF) (HCC)   GI bleed   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   Greater than 50%  of this time was spent counseling and coordinating care related to the above assessment and plan.  Total time: 65 minutes   Merry Proud, NP Palliative Medicine   Please contact Palliative Medicine Team phone at [phone] for questions and concerns.  For individual provider, see AMION.

## 2022-08-25 NOTE — Progress Notes (Addendum)
Progress Note  Primary GI: Dr. Leone Payor DOA: 08/15/2022         Hospital Day: 11   Subjective  Chief Complaint: GI bleeding  No family was present at the time of my evaluation.  Daughter GYN as long-term partner was in the room but states he is unfamiliar with patient enough to get information.  Gerasimos lives in town and should be showing up soon, also Trey Paula is another son.  Patient lying in bed, arousable with sternal rub/touch, not arousing to verbal stimuli.  Still very withdrawn, not able to answer questions.  I do not know patient's mental status this is my first time seeing him.  Per nurse she was able to eat some breakfast this morning but has been very lethargic all day.  Was also complaining of some change in vision. From nurse and reviewing the notes patient's had 2 black/red stools 1 at 10 this morning and 1 at 1230, he also had another 1 yesterday at 1930. Hemoglobin was 7 status post 1 unit 1-8.3 this morning 6.7 getting 1 unit now 1 1 is pending. Patient is also pending MRI brain for altered mental status and vision changes.    Objective   Vital signs in last 24 hours: Temp:  [97.7 F (36.5 C)-98.3 F (36.8 C)] 97.7 F (36.5 C) (07/29 1348) Pulse Rate:  [64-89] 69 (07/29 1348) Resp:  [16-21] 16 (07/29 1348) BP: (121-137)/(40-68) 122/40 (07/29 1348) SpO2:  [99 %-100 %] 100 % (07/29 1348) Last BM Date : 08/25/22 Last BM recorded by nurses in past 5 days Stool Type: Type 6 (Mushy consistency with ragged edges) (08/25/2022 12:36 PM)  General:   Responding to tactile stimuli, very lethargic Heart:  Regular rate and rhythm; no murmurs Pulm: Clear anteriorly; no wheezing Abdomen: soft, nondistended, normal bowel sounds in all quadrants. Nontender without guarding. Extremities:  No edema Neurologic: No focal deficits but very lethargic, unable to verbally respond. Psych: Very lethargic, not verbal at this time  Intake/Output from previous day: 07/28 0701 - 07/29 0700 In:  -  Out: 800 [Urine:800] Intake/Output this shift: Total I/O In: 392 [Blood:392] Out: 450 [Urine:450]  Studies/Results: CT HEAD WO CONTRAST ( )  Result Date: 08/24/2022 CLINICAL DATA:  Provided history: Acute onset of vision problem, atrial fibrillation, not on anticoagulation. EXAM: CT HEAD WITHOUT CONTRAST TECHNIQUE: Contiguous axial images were obtained from the base of the skull through the vertex without intravenous contrast. RADIATION DOSE REDUCTION: This exam was performed according to the departmental dose-optimization program which includes automated exposure control, adjustment of the mA and/or kV according to patient size and/or use of iterative reconstruction technique. COMPARISON:  Brain MRI 08/06/2022.  Head CT 08/01/2022. FINDINGS: Brain: There is no acute intracranial hemorrhage. No demarcated cortical infarct. No extra-axial fluid collection. No evidence of an intracranial mass. No midline shift. Vascular: No hyperdense vessel.  Atherosclerotic calcifications. Skull: No calvarial fracture or aggressive osseous lesion. Sinuses/Orbits: No mass or acute finding within the imaged orbits. Opacification of a posterior left ethmoid air cell. IMPRESSION: 1. No evidence of an acute intracranial abnormality. 2. Left ethmoid sinus disease, as described. Electronically Signed   By: Jackey Loge D.O.   On: 08/24/2022 11:02    Lab Results: Recent Labs    08/23/22 0212 08/24/22 0134 08/25/22 0625  WBC 6.4 5.6 5.3  HGB 7.0* 8.3* 6.7*  HCT 22.2* 25.2* 21.4*  PLT 143* 103* PLATELET CLUMPS NOTED ON SMEAR, UNABLE TO ESTIMATE   BMET Recent Labs  08/25/22 0625  NA 141  K 4.1  CL 113*  CO2 20*  GLUCOSE 123*  BUN 34*  CREATININE 1.46*  CALCIUM 8.4*   LFT No results for input(s): "PROT", "ALBUMIN", "AST", "ALT", "ALKPHOS", "BILITOT", "BILIDIR", "IBILI" in the last 72 hours. PT/INR No results for input(s): "LABPROT", "INR" in the last 72 hours.   Scheduled Meds: . cholecalciferol   5,000 Units Oral q morning  . levothyroxine  150 mcg Oral QAC breakfast  . pantoprazole  40 mg Oral Daily  . 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 No specimens collected  Hemoglobin was 7 status post 1 unit 8.3 and this morning was 6.7 currently getting 1 unit, has had active black/red bowel movements 2 today. This admission patient's had 5 units including the 1 currently hanging.  Currently patient is pending MRI brain for AMS and altered vision, concern being CVA off anticoagulation with A-fib. If MRI is negative for acute CVA, will discuss with family potential plan for repeat EGD for active bleeding though no good data showing benefit with radiation duodenitis. 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

## 2022-08-26 ENCOUNTER — Inpatient Hospital Stay (HOSPITAL_COMMUNITY): Payer: PPO

## 2022-08-26 ENCOUNTER — Inpatient Hospital Stay: Payer: PPO

## 2022-08-26 ENCOUNTER — Inpatient Hospital Stay: Payer: PPO | Admitting: Nurse Practitioner

## 2022-08-26 DIAGNOSIS — K921 Melena: Secondary | ICD-10-CM | POA: Diagnosis not present

## 2022-08-26 DIAGNOSIS — W888XXA Exposure to other ionizing radiation, initial encounter: Secondary | ICD-10-CM | POA: Diagnosis not present

## 2022-08-26 DIAGNOSIS — K2980 Duodenitis without bleeding: Secondary | ICD-10-CM | POA: Diagnosis not present

## 2022-08-26 DIAGNOSIS — D62 Acute posthemorrhagic anemia: Secondary | ICD-10-CM | POA: Diagnosis not present

## 2022-08-26 NOTE — Progress Notes (Signed)
PROGRESS NOTE    Carl Woods  MWU:[id number] DOB: [birth year] DOA: 08/15/2022 PCP: [name], MD    Chief Complaint  Patient presents with   Fatigue    Brief Narrative:   Carl Woods is a 89 y.o. 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  -Recent hospital stay in June for GIB due to prepyloric ulcer s/p clipping.  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  These were treated with APC.  Pt also had bilary stent placed during that admission on 7/10. 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 Myrtie Neither in his 7/7 consult note. Just admitted for same 2 weeks ago.  Now back in to ED with recurrent ABLA and melena. 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 -He did have large BM this morning, with melena and blood, so likely his hemoglobin will continue trending down .  Acute blood loss anemia Please see above discussion, continue to monitor and transfuse CBC as needed   Cancer of ampulla of Vater (HCC) ERCP with stent placed on 7/10.  Acute encephalopathy -Patient appears to be more confused, lethargic over last 48 hours, initial CT head with no acute findings, MRI is pending given his high risk for CVA given his A-fib   CKD (chronic kidney disease) stage 3, GFR 30-59 ml/min (HCC) Creat today of 1.7 looks to be similar to his baseline.   Hypothyroidism Cont synthroid   Atrial fibrillation (HCC) Appears to be in rate controlled a.fib t Not on AC at this time,related to recent GIBs including just 2 weeks ago. Appears to be more lethargic this morning, and some complaints of vision problem the night  before yesterday, (he denies this currently), CT head with no acute findings, will proceed with MRI brain to rule out any ischemic event given he is high risk of stroke given his known A-fib with elevated CHA2DS2-VASc score not on anticoagulation due to GI  bleed.     DVT prophylaxis: SCD Code Status: Full Family Communication: Cussed with son Griffin at bedside Disposition:   Status is: Inpatient    Consultants:  GI Palliative   Subjective:  Patient cannot provide reliable complaints this morning, but he had large BM with melena and fresh blood this morning Objective: Vitals:   08/26/22 0000 08/26/22 0021 08/26/22 0453 08/26/22 0800  BP:  (!) 102/53 (!) 106/46 (!) 114/39  Pulse: 73 70 (!) 59 65  Resp: 17 19 16 18   Temp:  98.2 F (36.8 C) 97.7 F (36.5 C) 97.7 F (36.5 C)  TempSrc:  Oral Axillary Axillary  SpO2:  97% 98% 97%  Weight:      Height:        Intake/Output Summary (Last 24 hours) at 08/26/2022 1428 Last data filed at 08/26/2022 1400 Gross per 24 hour  Intake 710 ml  Output 900 ml  Net -190 ml   Filed Weights   08/15/22 1844  Weight: 98 kg    Examination:  Awake somnolent, but wakes up, answering questions, but he appears today with mild confusion Symmetrical Chest wall movement, diminished air entry at the bases RRR,No Gallops,Rubs or new Murmurs, No Parasternal Heave +ve B.Sounds, Abd Soft, No tenderness, No rebound - guarding or rigidity. No Cyanosis, Clubbing or edema, No new Rash or bruise         Data Reviewed: I have personally reviewed following labs and imaging studies  CBC: Recent Labs  Lab 08/22/22 0116 08/23/22 0212 08/24/22 0134 08/25/22 0625 08/25/22 2121 08/26/22 0226  WBC 7.0 6.4 5.6 5.3  --  4.6  HGB 7.8* 7.0* 8.3* 6.7* 9.6* 9.2*  HCT 24.4* 22.2* 25.2* 21.4* 29.1* 27.9*  MCV 96.1 99.6 95.5 96.8  --  91.5  PLT PLATELET CLUMPS NOTED ON SMEAR, UNABLE TO ESTIMATE 143* 103* PLATELET CLUMPS NOTED ON SMEAR, UNABLE TO ESTIMATE  --  119*    Basic Metabolic Panel: Recent Labs  Lab 08/21/22 0203 08/21/22 0247 08/22/22 0116 08/25/22 0625 08/26/22 0226  NA 136  --  139 141 142  K 4.0  --  3.9 4.1 4.3  CL 108  --  111 113* 114*  CO2 20*  --  20* 20* 20*  GLUCOSE 96  --   100* 123* 87  BUN 25*  --  31* 34* 32*  CREATININE 1.44*  --  1.33* 1.46* 1.25*  CALCIUM 8.5*  --  8.6* 8.4* 8.6*  MG  --  1.7  --   --   --     GFR: Estimated Creatinine Clearance: 45.4 mL/min (A) (by C-G formula based on SCr of 1.25 mg/dL (H)).  Liver Function Tests: No results for input(s): "AST", "ALT", "ALKPHOS", "BILITOT", "PROT", "ALBUMIN" in the last 168 hours.  CBG: No results for input(s): "GLUCAP" in the last 168 hours.   No results found for this or any previous visit (from the past 240 hour(s)).       Radiology Studies: No results found.      Scheduled Meds:  cholecalciferol  5,000 Units Oral q morning   levothyroxine  150 mcg Oral QAC breakfast   pantoprazole  40 mg Oral Daily   sucralfate  1 g Oral TID  WC & HS   Continuous Infusions:      LOS: 9 days        Huey Bienenstock, MD [facility]   To contact the attending provider between 7A-7P or the covering provider during after hours 7P-7A, please log into the web site [email or web address] and access using universal [place] password for that web site. If you do not have the password, please call the hospital operator.  08/26/2022, 2:28 PM

## 2022-08-26 NOTE — Plan of Care (Signed)

## 2022-08-26 NOTE — Progress Notes (Signed)
Gastroenterology Inpatient Follow Up    Subjective: Patient appears more awake this afternoon compared to yesterday. He is able to answer questions appropriately but does intermittently take a long time to respond. Per patient's daughter and daughter-in-law at bedside, patient has continued to pass several red/black stools today.  Objective: Vital signs in last 24 hours: Temp:  [97.7 F (36.5 C)-98.2 F (36.8 C)] 97.7 F (36.5 C) (07/30 0800) Pulse Rate:  [57-73] 65 (07/30 0800) Resp:  [14-19] 18 (07/30 0800) BP: (102-114)/(39-61) 114/39 (07/30 0800) SpO2:  [97 %-100 %] 97 % (07/30 0800) Last BM Date : 08/25/22  Intake/Output from previous day: 07/29 0701 - 07/30 0700 In: 722 [Blood:722] Out: 1350 [Urine:1350] Intake/Output this shift: Total I/O In: 380 [P.O.:380] Out: 350 [Urine:350]  General appearance: alert and cooperative Resp: no increased WOB Cardio: regular rate GI: non-tender, non-distended  Lab Results: Recent Labs    08/24/22 0134 08/25/22 0625 08/25/22 2121 08/26/22 0226  WBC 5.6 5.3  --  4.6  HGB 8.3* 6.7* 9.6* 9.2*  HCT 25.2* 21.4* 29.1* 27.9*  PLT 103* PLATELET CLUMPS NOTED ON SMEAR, UNABLE TO ESTIMATE  --  119*   BMET Recent Labs    08/25/22 0625 08/26/22 0226  NA 141 142  K 4.1 4.3  CL 113* 114*  CO2 20* 20*  GLUCOSE 123* 87  BUN 34* 32*  CREATININE 1.46* 1.25*  CALCIUM 8.4* 8.6*   LFT No results for input(s): "PROT", "ALBUMIN", "AST", "ALT", "ALKPHOS", "BILITOT", "BILIDIR", "IBILI" in the last 72 hours. PT/INR No results for input(s): "LABPROT", "INR" in the last 72 hours. Hepatitis Panel No results for input(s): "HEPBSAG", "HCVAB", "HEPAIGM", "HEPBIGM" in the last 72 hours. C-Diff No results for input(s): "CDIFFTOX" in the last 72 hours.  Studies/Results: MR BRAIN WO CONTRAST  Result Date: 08/26/2022 CLINICAL DATA:  Altered mental status and increased confusion, patient with known A-fib not on anticoagulation due to GI  bleed, evaluate for stroke EXAM: MRI HEAD WITHOUT CONTRAST TECHNIQUE: Multiplanar, multiecho pulse sequences of the brain and surrounding structures were obtained without intravenous contrast. COMPARISON:  CT head 08/24/2022. FINDINGS: Brain: No acute infarction, hemorrhage, hydrocephalus, extra-axial collection or mass lesion. Vascular: Major arterial flow voids are maintained at the skull base. Skull and upper cervical spine: Normal marrow signal. Sinuses/Orbits: Mostly clear sinuses.  No acute orbital findings. Other: Trace mastoid effusions. IMPRESSION: No evidence of acute intracranial abnormality. Electronically Signed   By: Feliberto Harts M.D.   On: 08/26/2022 15:44    Medications: I have reviewed the patient's current medications. Scheduled:  cholecalciferol  5,000 Units Oral q morning   levothyroxine  150 mcg Oral QAC breakfast   pantoprazole  40 mg Oral Daily   sucralfate  1 g Oral TID WC & HS   Continuous: WUJ:[id number] **OR** acetaminophen, olopatadine, ondansetron **OR** ondansetron (ZOFRAN) IV, sodium chloride  Assessment/Plan: 89 year old male with history of atrial fibrillation (not on anticoagulation), CAD, HFpEF, CKD stage IIIb, ampullary cancer (stent in place) s/p radiation completed 03/2022, GAVE presented with melena and anemia. 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  Patient's hemoglobin today has stabilized, but the patient does describe continued hematochezia/melena, which could suggest that he has continued to have signs of bleeding.  Brain MRI today was normal, and the patient's mental status has improved today (he is much less somnolent compared to yesterday).  I did discuss  the idea of a repeat EGD with the patient and his daughter at bedside today.  Patient's daughter states that the patient can sometimes make decisions for himself, but that he likes to  input from his 3 children (2 sons and 1 daughter).  Patient's daughter did inquire about the utility of repeat EGD for treatment of bleeding since EGD comes with its own risks of undergoing sedation.  He has not responded to a recent course of APC treatment from a few days ago and has been requiring EGD every 3 weeks over the last 2 months.  It is not clear to me that a repeat EGD will significantly benefit the patient long-term.  At this point we will see what his blood counts do tomorrow and then decide whether or not to pursue EGD from there.  I do think that a palliative care consult is also reasonable at this time due to the patient's recurrent anemia requiring transfusions and EGDs as well as hospitalizations.  -Trend hemoglobin.  If hemoglobin drops again tomorrow, then will discuss the utility of repeat EGD -Recommend palliative care consult   LOS: 9 days   Imogene Burn 08/26/2022, 5:32 PM

## 2022-08-26 NOTE — Progress Notes (Signed)
Physical Therapy Treatment Patient Details Name: Carl Woods MRN: [id number] DOB: November 18, 1931 Today's Date: 08/26/2022   History of Present Illness Patient is 89 y.o. male who presents on 08/16/22 with fatigue and hgb 5.4. 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     PT Comments  Patient was agreeable to PT. He is alert, oriented x 3, and able to follow single step commands with delay which is an improvement from previous session as patient was more lethargic yesterday. The patient required moderate assistance for bed mobility. Standing performed x 1 bout with lifting and lowering assistance required. Patient has generalized weakness and unable to stand longer than 10 seconds. Posterior bias with transfers and during standing bout with anterior weight shifting facilitation provided. Walking not attempted due to poor standing tolerance. Recommend to continue PT to maximize independence and facilitate return to prior level of function.    If plan is discharge home, recommend the following: Assistance with cooking/housework;Direct supervision/assist for medications management;Assist for transportation;Help with stairs or ramp for entrance;A little help with walking and/or transfers;A little help with bathing/dressing/bathroom   Can travel by private vehicle     Yes  Equipment Recommendations  None recommended by PT    Recommendations for Other Services OT consult     Precautions / Restrictions Precautions Precautions: Fall Restrictions Weight Bearing Restrictions: No     Mobility  Bed Mobility Overal bed mobility: Needs Assistance Bed Mobility: Supine to Sit, Sit to Supine     Supine to sit: Mod assist Sit to supine: Mod assist    General bed mobility comments: assistance for LE and trunk support. increased time and effort required with all mobility tasks.    Transfers Overall transfer level: Needs assistance Equipment used: Rolling walker (2 wheels) Transfers: Sit to/from Stand Sit to Stand: Mod assist, From elevated surface           General transfer comment: lifting and lowering assistance provided with transfers. faciliation for anterior weight shifting and patient with posterior bias with standing    Ambulation/Gait               General Gait Details: not attempte due to poor standing tolerance, generalized weakness. standing tolerance of 10 seconds   Stairs             Wheelchair Mobility     Tilt Bed    Modified Rankin (Stroke Patients Only)       Balance Overall balance assessment: Needs assistance, History of Falls Sitting-balance support: Bilateral upper extremity supported, Feet unsupported Sitting balance-Leahy Scale: Fair Sitting balance - Comments: no external support required to maintain sitting balance. no dizziness is reported with mobility   Standing balance support: Bilateral upper extremity supported, Reliant on assistive device for balance Standing balance-Leahy Scale: Poor Standing balance comment: posterior lean in standing. faciliation for anterior weight shifting with limited standing tolerance                            Cognition Arousal/Alertness: Awake/alert Behavior During Therapy: WFL for tasks assessed/performed Overall Cognitive Status: Impaired/Different from baseline                               Problem Solving: Slow  processing, Requires verbal cues, Decreased initiation General Comments: Patient is oriented to self, place, situation. Disoriented to time. He is able to follow single step commands with increased time        Exercises      General Comments General comments (skin integrity, edema, etc.): BP sitting  initially was 100/54 HR 65, and after 3 minutes of sitting was 111/56 HR 71. patient reports feeling generally weak with no focal weakness noted. grip strength is equal bilaterally      Pertinent Vitals/Pain Pain Assessment Pain Assessment: No/denies pain    Home Living                          Prior Function            PT Goals (current goals can now be found in the care plan section) Acute Rehab PT Goals Patient Stated Goal: be in the same place as his wife or at least be able to visit easily PT Goal Formulation: With patient/family Time For Goal Achievement: 09/01/22 Potential to Achieve Goals: Fair Progress towards PT goals: Progressing toward goals    Frequency    Min 2X/week      PT Plan Current plan remains appropriate    Co-evaluation              AM-PAC PT "6 Clicks" Mobility   Outcome Measure  Help needed turning from your back to your side while in a flat bed without using bedrails?: A Lot Help needed moving from lying on your back to sitting on the side of a flat bed without using bedrails?: A Lot Help needed moving to and from a bed to a chair (including a wheelchair)?: A Lot Help needed standing up from a chair using your arms (e.g., wheelchair or bedside chair)?: A Lot Help needed to walk in hospital room?: Total   6 Click Score: 9    End of Session   Activity Tolerance: Patient limited by fatigue;Patient tolerated treatment well Patient left: in bed;with call bell/phone within reach;with bed alarm set Nurse Communication: Mobility status PT Visit Diagnosis: Muscle weakness (generalized) (M62.81);Difficulty in walking, not elsewhere classified (R26.2)     Time: 1255-1315 PT Time Calculation (min) (ACUTE ONLY): 20 min  Charges:    $Therapeutic Activity: 8-22 mins PT General Charges $$ ACUTE PT VISIT: 1 Visit                     Carl Woods, PT, MPT    Carl Woods 08/26/2022, 1:24 PM

## 2022-08-27 DIAGNOSIS — W888XXA Exposure to other ionizing radiation, initial encounter: Secondary | ICD-10-CM | POA: Diagnosis not present

## 2022-08-27 DIAGNOSIS — K921 Melena: Secondary | ICD-10-CM | POA: Diagnosis not present

## 2022-08-27 DIAGNOSIS — D62 Acute posthemorrhagic anemia: Secondary | ICD-10-CM | POA: Diagnosis not present

## 2022-08-27 DIAGNOSIS — Z515 Encounter for palliative care: Secondary | ICD-10-CM | POA: Diagnosis not present

## 2022-08-27 DIAGNOSIS — K2980 Duodenitis without bleeding: Secondary | ICD-10-CM | POA: Diagnosis not present

## 2022-08-27 DIAGNOSIS — I5032 Chronic diastolic (congestive) heart failure: Secondary | ICD-10-CM | POA: Diagnosis not present

## 2022-08-27 DIAGNOSIS — C241 Malignant neoplasm of ampulla of Vater: Secondary | ICD-10-CM | POA: Diagnosis not present

## 2022-08-27 DIAGNOSIS — K5521 Angiodysplasia of colon with hemorrhage: Secondary | ICD-10-CM | POA: Diagnosis not present

## 2022-08-27 NOTE — Progress Notes (Signed)
Physical Therapy Treatment Patient Details Name: Carl Woods MRN: [id number] DOB: 01/27/1932 Today's Date: 08/27/2022   History of Present Illness Patient is 89 y.o. male who presents on 08/16/22 with fatigue and hgb 5.4. 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    PT Comments  Pt made good progress towards goals today. Pt requires CGA for bed mobility and Min A for sit<>stand/gait. Pt continues with bil LE weakness and impaired balance. Pt requires reminders for safety in order to decrease risk for falls. Due to pt current functional status, home set up and available assistance at home recommending skilled physical therapy services 5x/weekly on discharge from acute care hospital setting in order to improve balance, strength and gait to decrease risk for falls, injury and re-hospitalization. No signs/symptoms of cardiac/respiratory distress throughout session.      If plan is discharge home, recommend the following: Assistance with cooking/housework;Assist for transportation;Help with stairs or ramp for entrance;A little help with walking and/or transfers   Can travel by private vehicle     Yes  Equipment Recommendations  None recommended by PT    Recommendations for Other Services       Precautions / Restrictions Precautions Precautions: Fall Precaution Comments: bowel incont., lethargy increased 7/29 Restrictions Weight Bearing Restrictions: No     Mobility  Bed Mobility Overal bed mobility: Needs Assistance Bed Mobility: Supine to Sit, Sit to Supine Rolling: Min assist   Supine to sit: Min guard Sit to supine: Min guard   General bed mobility comments: Pt requires CGA for safety with bed mobility and extra time. Patient  Response: Cooperative  Transfers Overall transfer level: Needs assistance Equipment used: Rolling walker (2 wheels) Transfers: Sit to/from Stand Sit to Stand: Min assist           General transfer comment: Pt requires Min A for initial momentum to get to standing and verbal cues for safe hand placement with sit to stand 3x from EOB    Ambulation/Gait Ambulation/Gait assistance: Min assist Gait Distance (Feet): 30 Feet Assistive device: Rolling walker (2 wheels) Gait Pattern/deviations: Step-through pattern, Decreased stride length, Trunk flexed Gait velocity: Decreased cadence. Gait velocity interpretation: <1.31 ft/sec, indicative of household ambulator   General Gait Details: Pt had slow steady gait with multi directional sway which improved with gait. Short step length bil     Tilt Bed Tilt Bed Patient Response: Cooperative  Modified Rankin (Stroke Patients Only)       Balance Overall balance assessment: Needs assistance, History of Falls Sitting-balance support: Bilateral upper extremity supported, Feet unsupported Sitting balance-Leahy Scale: Fair Sitting balance - Comments: no overt LOB   Standing balance support: Bilateral upper extremity supported, Reliant on assistive device for balance Standing balance-Leahy Scale: Poor Standing balance comment: Min A to maintain upright stance, improved COM over BOS this session      Cognition Arousal/Alertness: Awake/alert Behavior During Therapy: WFL for tasks assessed/performed Overall Cognitive Status: Within Functional Limits for tasks assessed               Pertinent Vitals/Pain Pain Assessment Pain Assessment: No/denies pain     PT Goals (current goals can now be found in the care plan section) Acute Rehab PT Goals Patient Stated Goal: be in the same place as his wife or at least be able to visit  easily PT Goal Formulation: With patient/family Time For Goal Achievement: 09/01/22 Potential to Achieve  Goals: Fair Progress towards PT goals: Progressing toward goals    Frequency    Min 2X/week      PT Plan Current plan remains appropriate       AM-PAC PT "6 Clicks" Mobility   Outcome Measure  Help needed turning from your back to your side while in a flat bed without using bedrails?: A Little Help needed moving from lying on your back to sitting on the side of a flat bed without using bedrails?: A Little Help needed moving to and from a bed to a chair (including a wheelchair)?: A Little Help needed standing up from a chair using your arms (e.g., wheelchair or bedside chair)?: A Little Help needed to walk in hospital room?: A Little Help needed climbing 3-5 steps with a railing? : A Lot 6 Click Score: 17    End of Session Equipment Utilized During Treatment: Gait belt Activity Tolerance: Patient tolerated treatment well Patient left: in bed;with call bell/phone within reach;with bed alarm set Nurse Communication: Mobility status PT Visit Diagnosis: Muscle weakness (generalized) (M62.81);Difficulty in walking, not elsewhere classified (R26.2)     Time: 8657-8469 PT Time Calculation (min) (ACUTE ONLY): 28 min  Charges:    $Gait Training: 8-22 mins $Therapeutic Activity: 8-22 mins PT General Charges $$ ACUTE PT VISIT: 1 Visit                     Harrel Carina, DPT, CLT  Acute Rehabilitation Services Office: [phone] (Secure chat preferred)    Claudia Desanctis 08/27/2022, 2:40 PM

## 2022-08-27 NOTE — Progress Notes (Signed)
Palliative Medicine Progress Note   Patient Name: Carl Woods       Date: 08/27/2022 DOB: [birth year]  Age: 89 y.o. MRN#: [id number] Attending Physician: Carl Bees, MD Primary Care Physician: Carl Fick, MD Admit Date: 08/15/2022    HPI/Patient Profile: 89 y.o. 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 He presented to the ED on 08/15/2022 with fatigue and hemoglobin reportedly 5.4 at the facility. He was admitted with GI bleed.  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 Palliative Medicine was consulted for goals of care.   Subjective: Chart reviewed.  Hemoglobin is stable today at 8.7.  I met at length today with patient and his son Carl Woods). We reviewed patient's hospital course and current clinical condition.  Patient and family are hopeful that hemoglobin will remain stable and that he can discharge in the next few days to SNF/rehab.  They understand that patient will continue to have issues with recurrent GI oozing/bleeding from his chronic duodenitis.  They understand he will continue to require frequent blood transfusions.  The plan is to monitor his hemoglobin closely while in rehab, and have blood transfusions as needed on an outpatient basis at the [facility].  We discussed code status.  We reviewed that prognosis would be poor in the event of cardiac arrest, as the cause of the arrest would likely associated with advanced disease rather than a reversible acute cardio-pulmonary event. We reviewed that DNR does not change the medical  plan and it only comes into effect in the event a person has arrested (died). Discussed that DNR is a protective measure to prevent trauma to a patient in what would likely be their last moments of life.  After further discussion, both patient and son agree that DNR is appropriate.   Discussed the importance of ongoing conversation with patient, family, and their medical providers regarding overall plan of care and treatment options.   Objective:  Physical Exam Vitals reviewed.  Constitutional:      General: He is not in acute distress.    Comments: Frail and chronically ill-appearing  Pulmonary:  Effort: Pulmonary effort is normal.  Neurological:     Mental Status: He is alert and oriented to person, place, and time.     Motor: Weakness present.             Vital Signs: BP (!) 122/52 (BP Location: Left Arm)   Pulse (!) 210   Temp (!) 97 F (36.1 C) (Axillary)   Resp 16   Ht 5\' 8"  (1.727 m)   Wt 87.2 kg   SpO2 96%   BMI 29.23 kg/m  SpO2: SpO2: 96 % O2 Device: O2 Device: Room Air  LBM: Last BM Date : 08/25/22       Palliative Medicine Assessment & Plan   Assessment: Principal Problem:   Gastrointestinal hemorrhage with melena Active Problems:   Atrial fibrillation (HCC)   Cancer of ampulla of Vater (HCC)   Hypothyroidism   Acute blood loss anemia   AVM (arteriovenous malformation) of small bowel, acquired with hemorrhage   CKD (chronic kidney disease) stage 3, GFR 30-59 ml/min (HCC)   Chronic heart failure with preserved ejection fraction (HFpEF) (HCC)   GI bleed   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    Greater than 50%  of this time was spent counseling and coordinating care related to the above assessment and plan.  Total time: 65 minutes   Merry Proud, NP Palliative Medicine   Please contact Palliative Medicine Team phone at [phone] for questions and concerns.  For individual provider, see AMION.

## 2022-08-27 NOTE — Progress Notes (Signed)
PROGRESS NOTE        PATIENT DETAILS Name: Carl Woods Age: 89 y.o. Sex: male Date of Birth: 05-22-1931 Admit Date: 08/15/2022 Admitting Physician Ava Gerri Lins, DO ZOX:[id number], Ajith, MD  Brief Summary: Patient is a 89 y.o.  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  Significant events: 7/19>> admit to [facility]  Significant studies: 7/19>> CXR: Mild cardiomegaly-interstitial opacities in both lung bases. 7/28>> CT head: No intracranial abnormality 7/30>> MRI brain: No intracranial abnormality.  Significant microbiology data: None  Procedures: 7/25>> EGD: GAVE with bleeding, chronic duodenitis with hemorrhage  Consults: GI Palliative care  Subjective: Had 2 melanotic stools yesterday.  No abdominal pain.  Objective: Vitals: Blood pressure (!) 122/52, pulse (!) 210, temperature (!) 97 F (36.1 C), temperature source Axillary, resp. rate 16, height 5\' 8"  (1.727 m), weight 87.2 kg, SpO2 96%.   Exam: Gen Exam:Alert awake-not in any distress-looks frail/chronically sick appearing. HEENT:atraumatic, normocephalic Chest: B/L clear to auscultation anteriorly CVS:S1S2 regular Abdomen:soft non tender, non distended Extremities:no edema Neurology: Non focal Skin: no rash  Pertinent Labs/Radiology:    Latest Ref Rng & Units 08/27/2022   12:40 AM 08/26/2022    2:26 AM 08/25/2022    9:21 PM  CBC  WBC 4.0 - 10.5 K/uL 5.4  4.6    Hemoglobin 13.0 - 17.0 g/dL 8.7  9.2  9.6   Hematocrit 39.0 - 52.0 % 27.2  27.9  29.1   Platelets 150 - 400 K/uL PLATELET CLUMPS NOTED ON SMEAR, UNABLE TO ESTIMATE  119      Lab Results  Component Value Date   NA 139 08/27/2022   K 3.5 08/27/2022   CL 110 08/27/2022   CO2 20 (L) 08/27/2022      Assessment/Plan: Recurrent upper GI bleeding with acute blood loss anemia Secondary to GAVE-and  duodenal mucosal friability from recent radiation for ampullary cancer S/p EGD on 7/25-unfortunately melena has reoccurred-GI following-last PRBC transfusion on 7/29-CBC stable but slowly downtrending. Await GI recommendation-difficult situation-no good treatment options at this point.  Palliative care consulted as well. In interim-continue with PPI  Cancer of ampulla of vater Recent ERCP with stent placement on 7/10 S/p radiation-completed 03/2022-unfortunately has developed what looks like radiation-induced duodenitis causing GI bleeding as well.  Acute metabolic encephalopathy Seems to have improved-relatively awake and alert today Neuroimaging negative for acute CVA  Persistent atrial fibrillation Rate controlled Not a candidate for anticoagulation given recurrent upper GI bleeding and EGD findings  CKD stage IIIb Creatinine at baseline Follow electrolytes periodically  Hypothyroidism Synthroid  Debility/deconditioning Secondary to acute illness PT/OT eval-SNF recommended  BMI: Estimated body mass index is 29.23 kg/m as calculated from the following:   Height as of this encounter: 5\' 8"  (1.727 m).   Weight as of this encounter: 87.2 kg.   Code status:   Code Status: Full Code   DVT Prophylaxis: SCDs Start: 08/16/22 0308   Family Communication: None at bedside   Disposition Plan: Status is: Inpatient Remains inpatient appropriate because: Severity of illness   Planned Discharge Destination:Skilled nursing facility   Diet: Diet Order             Diet full liquid Room service appropriate? Yes; Fluid consistency: Thin  Diet effective now  Antimicrobial agents: Anti-infectives (From admission, onward)    None        MEDICATIONS: Scheduled Meds:  cholecalciferol  5,000 Units Oral q morning   levothyroxine  150 mcg Oral QAC breakfast   pantoprazole  40 mg Oral Daily   sucralfate  1 g Oral TID WC & HS   Continuous  Infusions: PRN Meds:.acetaminophen **OR** acetaminophen, olopatadine, ondansetron **OR** ondansetron (ZOFRAN) IV, sodium chloride   I have personally reviewed following labs and imaging studies  LABORATORY DATA: CBC: Recent Labs  Lab 08/23/22 0212 08/24/22 0134 08/25/22 0625 08/25/22 2121 08/26/22 0226 08/27/22 0040  WBC 6.4 5.6 5.3  --  4.6 5.4  HGB 7.0* 8.3* 6.7* 9.6* 9.2* 8.7*  HCT 22.2* 25.2* 21.4* 29.1* 27.9* 27.2*  MCV 99.6 95.5 96.8  --  91.5 96.1  PLT 143* 103* PLATELET CLUMPS NOTED ON SMEAR, UNABLE TO ESTIMATE  --  119* PLATELET CLUMPS NOTED ON SMEAR, UNABLE TO ESTIMATE    Basic Metabolic Panel: Recent Labs  Lab 08/21/22 0203 08/21/22 0247 08/22/22 0116 08/25/22 0625 08/26/22 0226 08/27/22 0040  NA 136  --  139 141 142 139  K 4.0  --  3.9 4.1 4.3 3.5  CL 108  --  111 113* 114* 110  CO2 20*  --  20* 20* 20* 20*  GLUCOSE 96  --  100* 123* 87 90  BUN 25*  --  31* 34* 32* 28*  CREATININE 1.44*  --  1.33* 1.46* 1.25* 1.20  CALCIUM 8.5*  --  8.6* 8.4* 8.6* 8.4*  MG  --  1.7  --   --   --   --     GFR: Estimated Creatinine Clearance: 44.8 mL/min (by C-G formula based on SCr of 1.2 mg/dL).  Liver Function Tests: No results for input(s): "AST", "ALT", "ALKPHOS", "BILITOT", "PROT", "ALBUMIN" in the last 168 hours. No results for input(s): "LIPASE", "AMYLASE" in the last 168 hours. No results for input(s): "AMMONIA" in the last 168 hours.  Coagulation Profile: No results for input(s): "INR", "PROTIME" in the last 168 hours.  Cardiac Enzymes: No results for input(s): "CKTOTAL", "CKMB", "CKMBINDEX", "TROPONINI" in the last 168 hours.  BNP (last 3 results) No results for input(s): "PROBNP" in the last 8760 hours.  Lipid Profile: No results for input(s): "CHOL", "HDL", "LDLCALC", "TRIG", "CHOLHDL", "LDLDIRECT" in the last 72 hours.  Thyroid Function Tests: No results for input(s): "TSH", "T4TOTAL", "FREET4", "T3FREE", "THYROIDAB" in the last 72  hours.  Anemia Panel: No results for input(s): "VITAMINB12", "FOLATE", "FERRITIN", "TIBC", "IRON", "RETICCTPCT" in the last 72 hours.  Urine analysis:    Component Value Date/Time   COLORURINE YELLOW 08/01/2022 1652   APPEARANCEUR CLEAR 08/01/2022 1652   LABSPEC 1.009 08/01/2022 1652   PHURINE 5.0 08/01/2022 1652   GLUCOSEU 150 (A) 08/01/2022 1652   HGBUR NEGATIVE 08/01/2022 1652   BILIRUBINUR NEGATIVE 08/01/2022 1652   KETONESUR NEGATIVE 08/01/2022 1652   PROTEINUR NEGATIVE 08/01/2022 1652   NITRITE NEGATIVE 08/01/2022 1652   LEUKOCYTESUR NEGATIVE 08/01/2022 1652    Sepsis Labs: Lactic Acid, Venous No results found for: "LATICACIDVEN"  MICROBIOLOGY: No results found for this or any previous visit (from the past 240 hour(s)).  RADIOLOGY STUDIES/RESULTS: MR BRAIN WO CONTRAST  Result Date: 08/26/2022 CLINICAL DATA:  Altered mental status and increased confusion, patient with known A-fib not on anticoagulation due to GI bleed, evaluate for stroke EXAM: MRI HEAD WITHOUT CONTRAST TECHNIQUE: Multiplanar, multiecho pulse sequences of the brain and surrounding structures were obtained without  intravenous contrast. COMPARISON:  CT head 08/24/2022. FINDINGS: Brain: No acute infarction, hemorrhage, hydrocephalus, extra-axial collection or mass lesion. Vascular: Major arterial flow voids are maintained at the skull base. Skull and upper cervical spine: Normal marrow signal. Sinuses/Orbits: Mostly clear sinuses.  No acute orbital findings. Other: Trace mastoid effusions. IMPRESSION: No evidence of acute intracranial abnormality. Electronically Signed   By: Feliberto Harts M.D.   On: 08/26/2022 15:44     LOS: 10 days   Jeoffrey Massed, MD  [facility]    To contact the attending provider between 7A-7P or the covering provider during after hours 7P-7A, please log into the web site [email or web address] and access using universal [place] password for that web site. If you do not have the  password, please call the hospital operator.  08/27/2022, 11:07 AM

## 2022-08-27 NOTE — Plan of Care (Signed)
Patient condition without change

## 2022-08-27 NOTE — Progress Notes (Signed)
Gastroenterology Inpatient Follow Up    Subjective: Patient states that he has not had any bloody stools today.  Patient's son and pastor are at bedside.  Objective: Vital signs in last 24 hours: Temp:  [97 F (36.1 C)-98.4 F (36.9 C)] 97 F (36.1 C) (07/31 0439) Pulse Rate:  [55-210] 210 (07/31 0439) Resp:  [16-17] 16 (07/31 0439) BP: (122-124)/(52-63) 122/52 (07/31 0439) SpO2:  [96 %-100 %] 96 % (07/31 0800) Weight:  [87.2 kg] 87.2 kg (07/31 0137) Last BM Date : 08/25/22  Intake/Output from previous day: 07/30 0701 - 07/31 0700 In: 380 [P.O.:380] Out: 350 [Urine:350] Intake/Output this shift: No intake/output data recorded.  General appearance: alert and cooperative Resp: no increased WOB Cardio: regular rate GI: non-tender, non-distended  Lab Results: Recent Labs    08/25/22 0625 08/25/22 2121 08/26/22 0226 08/27/22 0040  WBC 5.3  --  4.6 5.4  HGB 6.7* 9.6* 9.2* 8.7*  HCT 21.4* 29.1* 27.9* 27.2*  PLT PLATELET CLUMPS NOTED ON SMEAR, UNABLE TO ESTIMATE  --  119* PLATELET CLUMPS NOTED ON SMEAR, UNABLE TO ESTIMATE   BMET Recent Labs    08/25/22 0625 08/26/22 0226 08/27/22 0040  NA 141 142 139  K 4.1 4.3 3.5  CL 113* 114* 110  CO2 20* 20* 20*  GLUCOSE 123* 87 90  BUN 34* 32* 28*  CREATININE 1.46* 1.25* 1.20  CALCIUM 8.4* 8.6* 8.4*   LFT No results for input(s): "PROT", "ALBUMIN", "AST", "ALT", "ALKPHOS", "BILITOT", "BILIDIR", "IBILI" in the last 72 hours. PT/INR No results for input(s): "LABPROT", "INR" in the last 72 hours. Hepatitis Panel No results for input(s): "HEPBSAG", "HCVAB", "HEPAIGM", "HEPBIGM" in the last 72 hours. C-Diff No results for input(s): "CDIFFTOX" in the last 72 hours.  Studies/Results: MR BRAIN WO CONTRAST  Result Date: 08/26/2022 CLINICAL DATA:  Altered mental status and increased confusion, patient with known A-fib not on anticoagulation due to GI bleed, evaluate for stroke EXAM: MRI HEAD WITHOUT CONTRAST TECHNIQUE:  Multiplanar, multiecho pulse sequences of the brain and surrounding structures were obtained without intravenous contrast. COMPARISON:  CT head 08/24/2022. FINDINGS: Brain: No acute infarction, hemorrhage, hydrocephalus, extra-axial collection or mass lesion. Vascular: Major arterial flow voids are maintained at the skull base. Skull and upper cervical spine: Normal marrow signal. Sinuses/Orbits: Mostly clear sinuses.  No acute orbital findings. Other: Trace mastoid effusions. IMPRESSION: No evidence of acute intracranial abnormality. Electronically Signed   By: Feliberto Harts M.D.   On: 08/26/2022 15:44    Medications: I have reviewed the patient's current medications. Scheduled:  cholecalciferol  5,000 Units Oral q morning   levothyroxine  150 mcg Oral QAC breakfast   pantoprazole  40 mg Oral Daily   sucralfate  1 g Oral TID WC & HS   Continuous: ZOX:[id number] **OR** acetaminophen, olopatadine, ondansetron **OR** ondansetron (ZOFRAN) IV, sodium chloride  Assessment/Plan: 89 year old male with history of atrial fibrillation (not on anticoagulation), CAD, HFpEF, CKD stage IIIb, ampullary cancer (stent in place) s/p radiation completed 03/2022, GAVE presented with melena and anemia. 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  Despite the patient's multiple bloody stools from yesterday, his hemoglobin only dropped from 9.2 to 8.7.  Patient has not had any bloody stools today.  This could suggest that his bleeding could be resolving after APC treatment as well as with PPI and sucralfate therapy.  We will continue to  monitor his blood counts over time to see if they fully stabilize. -Continue PPI and carafate -Trend hemoglobin.  If hemoglobin drops in the future, then will discuss the utility of repeat EGD   LOS: 10 days   Imogene Burn 08/27/2022, 2:42 PM

## 2022-08-27 NOTE — Plan of Care (Signed)

## 2022-08-27 NOTE — TOC Progression Note (Signed)
Transition of Care [facility]) - Progression Note    Patient Details  Name: Carl Woods MRN: [id number] Date of Birth: March 08, 1931  [facility]) CM/SW Contact  Mearl Latin, LCSW Phone Number: 08/27/2022, 9:09 AM  Clinical Narrative:    CSW continuing to follow.    Expected Discharge Plan: Skilled Nursing Facility Barriers to Discharge: Continued Medical Work up  Expected Discharge Plan and Services In-house Referral: Clinical Social Work   Post Acute Care Choice: Skilled Nursing Facility Living arrangements for the past 2 months: Single Family Home                                       Social Determinants of Health (SDOH) Interventions SDOH Screenings   Food Insecurity: No Food Insecurity (08/21/2022)  Housing: Patient Declined (08/21/2022)  Transportation Needs: Patient Declined (08/21/2022)  Utilities: Patient Declined (08/21/2022)  Depression (PHQ2-9): Low Risk  (02/18/2022)  Tobacco Use: Medium Risk (08/21/2022)    Readmission Risk Interventions    08/04/2022    3:58 PM 07/11/2022   12:01 PM 07/07/2022    1:52 PM  Readmission Risk Prevention Plan  Transportation Screening Complete  Complete  PCP or Specialist Appt within 3-5 Days Complete  Complete  HRI or Home Care Consult Complete  Complete  Social Work Consult for Recovery Care Planning/Counseling Complete  Complete  Palliative Care Screening Complete Complete Not Applicable  Medication Review Oceanographer) Complete  Complete

## 2022-08-28 ENCOUNTER — Telehealth: Payer: Self-pay

## 2022-08-28 DIAGNOSIS — D649 Anemia, unspecified: Secondary | ICD-10-CM | POA: Diagnosis not present

## 2022-08-28 DIAGNOSIS — K31819 Angiodysplasia of stomach and duodenum without bleeding: Secondary | ICD-10-CM | POA: Diagnosis not present

## 2022-08-28 DIAGNOSIS — K921 Melena: Secondary | ICD-10-CM | POA: Diagnosis not present

## 2022-08-28 MED ORDER — PANTOPRAZOLE SODIUM 40 MG PO TBEC
40.0000 mg | DELAYED_RELEASE_TABLET | Freq: Two times a day (BID) | ORAL | Status: DC
  Administered 2022-08-28 – 2022-08-29 (×3): 40 mg via ORAL
  Filled 2022-08-28 (×3): qty 1

## 2022-08-28 MED ORDER — POTASSIUM CHLORIDE CRYS ER 20 MEQ PO TBCR
40.0000 meq | EXTENDED_RELEASE_TABLET | Freq: Once | ORAL | Status: AC
  Administered 2022-08-28: 40 meq via ORAL
  Filled 2022-08-28: qty 2

## 2022-08-28 NOTE — Progress Notes (Signed)
    Gastroenterology Inpatient Follow Up    Subjective: Patient denies any bloody stools since yesterday.  Objective: Vital signs in last 24 hours: Temp:  [97.5 F (36.4 C)-98.1 F (36.7 C)] 97.7 F (36.5 C) (08/01 1212) Pulse Rate:  [70-182] 74 (08/01 1212) Resp:  [15-24] 20 (08/01 1212) BP: (98-139)/(46-93) 120/62 (08/01 1212) SpO2:  [96 %-100 %] 100 % (08/01 1212) Last BM Date : 08/27/22  Intake/Output from previous day: 07/31 0701 - 08/01 0700 In: 1800 [P.O.:1800] Out: 2000 [Urine:2000] Intake/Output this shift: No intake/output data recorded.  General appearance: alert and cooperative Resp: no increased WOB Cardio: regular rate GI: non-tender, non-distended  Lab Results: Recent Labs    08/26/22 0226 08/27/22 0040 08/28/22 0509  WBC 4.6 5.4 6.1  HGB 9.2* 8.7* 8.9*  HCT 27.9* 27.2* 27.4*  PLT 119* PLATELET CLUMPS NOTED ON SMEAR, UNABLE TO ESTIMATE 116*   BMET Recent Labs    08/26/22 0226 08/27/22 0040 08/28/22 0509  NA 142 139 137  K 4.3 3.5 3.4*  CL 114* 110 109  CO2 20* 20* 20*  GLUCOSE 87 90 88  BUN 32* 28* 23  CREATININE 1.25* 1.20 1.13  CALCIUM 8.6* 8.4* 8.3*   LFT No results for input(s): "PROT", "ALBUMIN", "AST", "ALT", "ALKPHOS", "BILITOT", "BILIDIR", "IBILI" in the last 72 hours. PT/INR No results for input(s): "LABPROT", "INR" in the last 72 hours. Hepatitis Panel No results for input(s): "HEPBSAG", "HCVAB", "HEPAIGM", "HEPBIGM" in the last 72 hours. C-Diff No results for input(s): "CDIFFTOX" in the last 72 hours.  Studies/Results: No results found.  Medications: I have reviewed the patient's current medications. Scheduled:  cholecalciferol  5,000 Units Oral q morning   levothyroxine  150 mcg Oral QAC breakfast   pantoprazole  40 mg Oral BID   sucralfate  1 g Oral TID WC & HS   Continuous: ZOX:[id number] **OR** acetaminophen, olopatadine, ondansetron **OR** ondansetron (ZOFRAN) IV, sodium chloride  Assessment/Plan: 89  year old male with history of atrial fibrillation (not on anticoagulation), CAD, HFpEF, CKD stage IIIb, ampullary cancer (stent in place) s/p radiation completed 03/2022, GAVE presented with melena and anemia. 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  Patient's hemoglobin continues to remain stable, and he denies any bloody stools for the last 2 days.  This could suggest that his bleeding could be resolving after APC treatment as well as with PPI and sucralfate therapy.  -Continue PPI and carafate -We will arrange for outpatient GI follow-up.  GI will sign off for now.  Please call back if any new questions arise.   LOS: 11 days   Imogene Burn 08/28/2022, 3:19 PM

## 2022-08-28 NOTE — Telephone Encounter (Signed)
OV scheduled for 11/24/22 at 10:50 AM with Dr. Leone Payor. Pt is still currently hospitalized. Appointment date will be discussed/available on AVS discharge summary.

## 2022-08-28 NOTE — Progress Notes (Signed)
PROGRESS NOTE        PATIENT DETAILS Name: Carl Woods Age: 89 y.o. Sex: male Date of Birth: 07/25/1931 Admit Date: 08/15/2022 Admitting Physician Ava Gerri Lins, DO YQM:[id number], Ajith, MD  Brief Summary: Patient is a 89 y.o.  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  Significant events: 7/19>> admit to [facility]  Significant studies: 7/19>> CXR: Mild cardiomegaly-interstitial opacities in both lung bases. 7/28>> CT head: No intracranial abnormality 7/30>> MRI brain: No intracranial abnormality.  Significant microbiology data: None  Procedures: 7/25>> EGD: GAVE with bleeding, chronic duodenitis with hemorrhage  Consults: GI Palliative care  Subjective: No BM yesterday apparently had a dark brown BM this morning.  Hb remained stable.  No other complaints this morning.  Sleepy but easily arousable.  Objective: Vitals: Blood pressure (!) 112/49, pulse 77, temperature 97.8 F (36.6 C), temperature source Oral, resp. rate 17, height 5\' 8"  (1.727 m), weight 87.2 kg, SpO2 100%.   Exam: Gen Exam:Alert awake-not in any distress HEENT:atraumatic, normocephalic Chest: B/L clear to auscultation anteriorly CVS:S1S2 regular Abdomen:soft non tender, non distended Extremities:no edema Neurology: Non focal Skin: no rash  Pertinent Labs/Radiology:    Latest Ref Rng & Units 08/28/2022    5:09 AM 08/27/2022   12:40 AM 08/26/2022    2:26 AM  CBC  WBC 4.0 - 10.5 K/uL 6.1  5.4  4.6   Hemoglobin 13.0 - 17.0 g/dL 8.9  8.7  9.2   Hematocrit 39.0 - 52.0 % 27.4  27.2  27.9   Platelets 150 - 400 K/uL 116  PLATELET CLUMPS NOTED ON SMEAR, UNABLE TO ESTIMATE  119     Lab Results  Component Value Date   NA 137 08/28/2022   K 3.4 (L) 08/28/2022   CL 109 08/28/2022   CO2 20 (L) 08/28/2022     Assessment/Plan: Recurrent upper GI bleeding  with acute blood loss anemia Secondary to GAVE-and duodenal mucosal friability from recent radiation for ampullary cancer S/p EGD on 7/25 Also had a brief recurrent GI bleed-this seems to have spontaneously resolved and has stabilized with stable Hb Unfortunately-no good treatment options at this point-GI contemplating whether any further endoscopic evaluation will be beneficial in the long run.  However as noted above-GI bleeding seems to have spontaneously resolved. Continue with Carafate/PPI  Cancer of ampulla of vater Recent ERCP with stent placement on 7/10 S/p radiation-completed 03/2022-unfortunately has developed what looks like radiation-induced duodenitis causing GI bleeding as well.  Acute metabolic encephalopathy Seems to have improved-relatively awake and alert today Neuroimaging negative for acute CVA  Persistent atrial fibrillation Rate controlled Not a candidate for anticoagulation given recurrent upper GI bleeding and EGD findings  CKD stage IIIb Creatinine at baseline Follow electrolytes periodically  Hypothyroidism Synthroid  Debility/deconditioning Secondary to acute illness PT/OT eval-SNF recommended  BMI: Estimated body mass index is 29.23 kg/m as calculated from the following:   Height as of this encounter: 5\' 8"  (1.727 m).   Weight as of this encounter: 87.2 kg.   Code status:   Code Status: DNR   DVT Prophylaxis: SCDs Start: 08/16/22 0308   Family Communication: Daughter [name]-[phone]-updated over the phone 8/1   Disposition Plan: Status is: Inpatient Remains inpatient appropriate because: Severity of illness   Planned Discharge Destination:Skilled nursing facility  Diet: Diet Order             Diet Heart Room service appropriate? No; Fluid consistency: Thin  Diet effective now                     Antimicrobial agents: Anti-infectives (From admission, onward)    None        MEDICATIONS: Scheduled Meds:   cholecalciferol  5,000 Units Oral q morning   levothyroxine  150 mcg Oral QAC breakfast   pantoprazole  40 mg Oral BID   sucralfate  1 g Oral TID WC & HS   Continuous Infusions: PRN Meds:.acetaminophen **OR** acetaminophen, olopatadine, ondansetron **OR** ondansetron (ZOFRAN) IV, sodium chloride   I have personally reviewed following labs and imaging studies  LABORATORY DATA: CBC: Recent Labs  Lab 08/24/22 0134 08/25/22 0625 08/25/22 2121 08/26/22 0226 08/27/22 0040 08/28/22 0509  WBC 5.6 5.3  --  4.6 5.4 6.1  HGB 8.3* 6.7* 9.6* 9.2* 8.7* 8.9*  HCT 25.2* 21.4* 29.1* 27.9* 27.2* 27.4*  MCV 95.5 96.8  --  91.5 96.1 95.5  PLT 103* PLATELET CLUMPS NOTED ON SMEAR, UNABLE TO ESTIMATE  --  119* PLATELET CLUMPS NOTED ON SMEAR, UNABLE TO ESTIMATE 116*    Basic Metabolic Panel: Recent Labs  Lab 08/22/22 0116 08/25/22 0625 08/26/22 0226 08/27/22 0040 08/28/22 0509  NA 139 141 142 139 137  K 3.9 4.1 4.3 3.5 3.4*  CL 111 113* 114* 110 109  CO2 20* 20* 20* 20* 20*  GLUCOSE 100* 123* 87 90 88  BUN 31* 34* 32* 28* 23  CREATININE 1.33* 1.46* 1.25* 1.20 1.13  CALCIUM 8.6* 8.4* 8.6* 8.4* 8.3*    GFR: Estimated Creatinine Clearance: 47.6 mL/min (by C-G formula based on SCr of 1.13 mg/dL).  Liver Function Tests: No results for input(s): "AST", "ALT", "ALKPHOS", "BILITOT", "PROT", "ALBUMIN" in the last 168 hours. No results for input(s): "LIPASE", "AMYLASE" in the last 168 hours. No results for input(s): "AMMONIA" in the last 168 hours.  Coagulation Profile: No results for input(s): "INR", "PROTIME" in the last 168 hours.  Cardiac Enzymes: No results for input(s): "CKTOTAL", "CKMB", "CKMBINDEX", "TROPONINI" in the last 168 hours.  BNP (last 3 results) No results for input(s): "PROBNP" in the last 8760 hours.  Lipid Profile: No results for input(s): "CHOL", "HDL", "LDLCALC", "TRIG", "CHOLHDL", "LDLDIRECT" in the last 72 hours.  Thyroid Function Tests: No results for  input(s): "TSH", "T4TOTAL", "FREET4", "T3FREE", "THYROIDAB" in the last 72 hours.  Anemia Panel: No results for input(s): "VITAMINB12", "FOLATE", "FERRITIN", "TIBC", "IRON", "RETICCTPCT" in the last 72 hours.  Urine analysis:    Component Value Date/Time   COLORURINE YELLOW 08/01/2022 1652   APPEARANCEUR CLEAR 08/01/2022 1652   LABSPEC 1.009 08/01/2022 1652   PHURINE 5.0 08/01/2022 1652   GLUCOSEU 150 (A) 08/01/2022 1652   HGBUR NEGATIVE 08/01/2022 1652   BILIRUBINUR NEGATIVE 08/01/2022 1652   KETONESUR NEGATIVE 08/01/2022 1652   PROTEINUR NEGATIVE 08/01/2022 1652   NITRITE NEGATIVE 08/01/2022 1652   LEUKOCYTESUR NEGATIVE 08/01/2022 1652    Sepsis Labs: Lactic Acid, Venous No results found for: "LATICACIDVEN"  MICROBIOLOGY: No results found for this or any previous visit (from the past 240 hour(s)).  RADIOLOGY STUDIES/RESULTS: MR BRAIN WO CONTRAST  Result Date: 08/26/2022 CLINICAL DATA:  Altered mental status and increased confusion, patient with known A-fib not on anticoagulation due to GI bleed, evaluate for stroke EXAM: MRI HEAD WITHOUT CONTRAST TECHNIQUE: Multiplanar, multiecho pulse sequences of the brain and surrounding  structures were obtained without intravenous contrast. COMPARISON:  CT head 08/24/2022. FINDINGS: Brain: No acute infarction, hemorrhage, hydrocephalus, extra-axial collection or mass lesion. Vascular: Major arterial flow voids are maintained at the skull base. Skull and upper cervical spine: Normal marrow signal. Sinuses/Orbits: Mostly clear sinuses.  No acute orbital findings. Other: Trace mastoid effusions. IMPRESSION: No evidence of acute intracranial abnormality. Electronically Signed   By: Feliberto Harts M.D.   On: 08/26/2022 15:44     LOS: 11 days   Jeoffrey Massed, MD  [facility]    To contact the attending provider between 7A-7P or the covering provider during after hours 7P-7A, please log into the web site [email or web address] and access  using universal [place] password for that web site. If you do not have the password, please call the hospital operator.  08/28/2022, 11:58 AM

## 2022-08-28 NOTE — Plan of Care (Signed)

## 2022-08-28 NOTE — Telephone Encounter (Signed)
-----   Message from Imogene Burn sent at 08/28/2022  3:20 PM EDT ----- Marilynn Rail, please arrange for GI clinic follow up with Dr. Leone Payor or APP in 2 months. Thanks.

## 2022-08-28 NOTE — TOC Progression Note (Addendum)
Transition of Care [facility]) - Progression Note    Patient Details  Name: Carl Woods MRN: [id number] Date of Birth: 1931-04-20  Transition of Care [facility]) CM/SW Contact  Mearl Latin, LCSW Phone Number: 08/28/2022, 1:54 PM  Clinical Narrative:    CSW updated [facility] that patient will likely be ready to discharge tomorrow and confirmed with [facility] that Berkley Harvey is good until tomorrow at 5pm.  CSW updated patient's daughter, Carl Woods.    Expected Discharge Plan: Skilled Nursing Facility Barriers to Discharge: Continued Medical Work up  Expected Discharge Plan and Services In-house Referral: Clinical Social Work   Post Acute Care Choice: Skilled Nursing Facility Living arrangements for the past 2 months: Single Family Home                                       Social Determinants of Health (SDOH) Interventions SDOH Screenings   Food Insecurity: No Food Insecurity (08/21/2022)  Housing: Patient Declined (08/21/2022)  Transportation Needs: Patient Declined (08/21/2022)  Utilities: Patient Declined (08/21/2022)  Depression (PHQ2-9): Low Risk  (02/18/2022)  Tobacco Use: Medium Risk (08/21/2022)    Readmission Risk Interventions    08/04/2022    3:58 PM 07/11/2022   12:01 PM 07/07/2022    1:52 PM  Readmission Risk Prevention Plan  Transportation Screening Complete  Complete  PCP or Specialist Appt within 3-5 Days Complete  Complete  HRI or Home Care Consult Complete  Complete  Social Work Consult for Recovery Care Planning/Counseling Complete  Complete  Palliative Care Screening Complete Complete Not Applicable  Medication Review Oceanographer) Complete  Complete

## 2022-08-29 DIAGNOSIS — Z7401 Bed confinement status: Secondary | ICD-10-CM | POA: Diagnosis not present

## 2022-08-29 DIAGNOSIS — N183 Chronic kidney disease, stage 3 unspecified: Secondary | ICD-10-CM | POA: Diagnosis not present

## 2022-08-29 DIAGNOSIS — R531 Weakness: Secondary | ICD-10-CM | POA: Diagnosis not present

## 2022-08-29 DIAGNOSIS — Z5112 Encounter for antineoplastic immunotherapy: Secondary | ICD-10-CM | POA: Diagnosis not present

## 2022-08-29 DIAGNOSIS — G9341 Metabolic encephalopathy: Secondary | ICD-10-CM | POA: Diagnosis not present

## 2022-08-29 DIAGNOSIS — D500 Iron deficiency anemia secondary to blood loss (chronic): Secondary | ICD-10-CM | POA: Diagnosis not present

## 2022-08-29 DIAGNOSIS — R58 Hemorrhage, not elsewhere classified: Secondary | ICD-10-CM | POA: Diagnosis not present

## 2022-08-29 DIAGNOSIS — R2681 Unsteadiness on feet: Secondary | ICD-10-CM | POA: Diagnosis not present

## 2022-08-29 DIAGNOSIS — I2510 Atherosclerotic heart disease of native coronary artery without angina pectoris: Secondary | ICD-10-CM | POA: Diagnosis not present

## 2022-08-29 DIAGNOSIS — E039 Hypothyroidism, unspecified: Secondary | ICD-10-CM | POA: Diagnosis not present

## 2022-08-29 DIAGNOSIS — Z515 Encounter for palliative care: Secondary | ICD-10-CM | POA: Diagnosis not present

## 2022-08-29 DIAGNOSIS — K21 Gastro-esophageal reflux disease: Secondary | ICD-10-CM | POA: Diagnosis not present

## 2022-08-29 DIAGNOSIS — K5521 Angiodysplasia of colon with hemorrhage: Secondary | ICD-10-CM | POA: Diagnosis not present

## 2022-08-29 DIAGNOSIS — Z79899 Other long term (current) drug therapy: Secondary | ICD-10-CM | POA: Diagnosis not present

## 2022-08-29 DIAGNOSIS — D62 Acute posthemorrhagic anemia: Secondary | ICD-10-CM | POA: Diagnosis not present

## 2022-08-29 DIAGNOSIS — D519 Vitamin B12 deficiency anemia, unspecified: Secondary | ICD-10-CM | POA: Diagnosis not present

## 2022-08-29 DIAGNOSIS — K921 Melena: Secondary | ICD-10-CM | POA: Diagnosis not present

## 2022-08-29 DIAGNOSIS — K922 Gastrointestinal hemorrhage, unspecified: Secondary | ICD-10-CM | POA: Diagnosis not present

## 2022-08-29 DIAGNOSIS — I4891 Unspecified atrial fibrillation: Secondary | ICD-10-CM | POA: Diagnosis not present

## 2022-08-29 DIAGNOSIS — C241 Malignant neoplasm of ampulla of Vater: Secondary | ICD-10-CM | POA: Diagnosis not present

## 2022-08-29 DIAGNOSIS — D649 Anemia, unspecified: Secondary | ICD-10-CM | POA: Diagnosis not present

## 2022-08-29 NOTE — TOC Transition Note (Signed)
Transition of Care [facility]) - CM/SW Discharge Note   Patient Details  Name: Carl Woods MRN: [id number] Date of Birth: 04-Aug-1931  [facility]) CM/SW Contact:  [name], LCSW Phone Number: 08/29/2022, 10:20 AM   Clinical Narrative:    Patient will DC to: [facility] Anticipated DC date: 08/29/2022 Family notified: [name] Transport by: [name]   Per MD patient ready for DC to [facility]. RN, patient, patient's family, and facility notified of DC. Discharge Summary and FL2 sent to facility. RN to call report prior to discharge [phone]. DC packet on chart. Ambulance transport requested for patient.   CSW will sign off for now as social work intervention is no longer needed. Please consult [place] again if new needs arise.    Final next level of care: Skilled Nursing Facility Barriers to Discharge: No Barriers Identified   Patient Goals and CMS Choice CMS Medicare.gov Compare Post Acute Care list provided to:: Patient Choice offered to / list presented to : Patient, Adult Children  Discharge Placement                Patient chooses bed at: [facility] Patient to be transferred to facility by: [facility] Name of family member notified: [name] Patient and family notified of of transfer: 08/29/22  Discharge Plan and Services Additional resources added to the After Visit Summary for   In-house Referral: Clinical Social Work   Post Acute Care Choice: Skilled Nursing Facility                               Social Determinants of Health (SDOH) Interventions SDOH Screenings   Food Insecurity: No Food Insecurity (08/21/2022)  Housing: Patient Declined (08/21/2022)  Transportation Needs: Patient Declined (08/21/2022)  Utilities: Patient Declined (08/21/2022)  Depression (PHQ2-9): Low Risk  (02/18/2022)  Tobacco Use: Medium Risk (08/21/2022)     Readmission Risk Interventions    08/04/2022    3:58 PM 07/11/2022   12:01 PM 07/07/2022     1:52 PM  Readmission Risk Prevention Plan  Transportation Screening Complete  Complete  PCP or Specialist Appt within 3-5 Days Complete  Complete  HRI or Home Care Consult Complete  Complete  Social Work Consult for Recovery Care Planning/Counseling Complete  Complete  Palliative Care Screening Complete Complete Not Applicable  Medication Review Oceanographer) Complete  Complete    Oletta Lamas, MSW, LCSWA, LCASA Transitions of Care  Clinical Social Worker I

## 2022-08-29 NOTE — Plan of Care (Signed)

## 2022-08-29 NOTE — Discharge Summary (Addendum)
PATIENT DETAILS Name: Carl Woods Age: 89 y.o. Sex: male Date of Birth: [birth year] MRN: [id number]. Admitting Physician: Fran Lowes, DO WUJ:[id number], [name], MD  Admit Date: 08/15/2022 Discharge date: 08/29/2022  Recommendations for Outpatient Follow-up:  Follow up with PCP in 1-2 weeks Please obtain CMP/CBC in one week  Admitted From:  SNF  Disposition: Skilled nursing facility   Discharge Condition: good  CODE STATUS:   Code Status: DNR   Diet recommendation:  Diet Order             Diet - low sodium heart healthy           Diet Heart Room service appropriate? No; Fluid consistency: Thin  Diet effective now                    Brief Summary: Patient is a 89 y.o.  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   Significant events: 7/19>> admit to [facility]   Significant studies: 7/19>> CXR: Mild cardiomegaly-interstitial opacities in both lung bases. 7/28>> CT head: No intracranial abnormality 7/30>> MRI brain: No intracranial abnormality.   Significant microbiology data: None   Procedures: 7/25>> EGD: GAVE with bleeding, chronic duodenitis with hemorrhage   Consults: GI Palliative care  Brief Hospital Course: Recurrent upper GI bleeding with acute blood loss anemia Secondary to GAVE-and duodenal mucosal friability from recent radiation for ampullary cancer S/p EGD on 7/25 Managed with supportive care-including PPI and a total of 6 units of PRBC-last transfer patient on 7/29. There was some concern that patient had recurrent GI bleeding-on 7/21-required 2 additional units of PRBC-thankfully bleeding has resolved spontaneously-hemoglobin continues to be stable.  Discussed with GI MD-no plans for repeat endoscopy evaluation-unfortunately no good treatment options at this point-GI contemplating whether any further endoscopic  evaluation will be beneficial in the long run.  Note-both patient/family aware that he will continue to be at risk for recurrent GI bleeding-and recurrent hospitalizations/decompensation.  Cancer of ampulla of vater Recent ERCP with stent placement on 7/10 S/p radiation-completed 03/2022-unfortunately has developed what looks like radiation-induced duodenitis causing GI bleeding as well.   Acute metabolic encephalopathy Improved-relatively awake and alert today Neuroimaging negative for acute CVA   Persistent atrial fibrillation Rate controlled Not a candidate for anticoagulation given recurrent upper GI bleeding and EGD findings   CKD stage IIIb Creatinine at baseline Follow electrolytes periodically   Hypothyroidism Synthroid   Debility/deconditioning Secondary to acute illness PT/OT eval-SNF recommended  Palliative care/goals of care Evaluated by palliative care during this hospitalization given ongoing risk of recurrent GI bleeding without any good long-term treatment options.  Now a DNR-continue palliative care follow-up at SNF.    BMI: Estimated body mass index is 29.23 kg/m as calculated from the following:   Height as of this encounter: 5\' 8"  (1.727 m).   Weight as of this encounter: 87.2 kg.    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   Discharge Instructions:  Activity:  As tolerated with Full  fall precautions use walker/cane & assistance as needed   Discharge Instructions     Call MD for:  difficulty breathing, headache or visual  disturbances   Complete by: As directed    Call MD for:  persistant nausea and vomiting   Complete by: As directed    Call MD for:  severe uncontrolled pain   Complete by: As directed    Diet - low sodium heart healthy   Complete by: As directed    Discharge instructions   Complete by: As directed    Follow with Primary MD  [name], MD in 1-2 weeks  Please get a complete blood count and chemistry panel checked by your Primary MD at your next visit, and again as instructed by your Primary MD.  Get Medicines reviewed and adjusted: Please take all your medications with you for your next visit with your Primary MD  Laboratory/radiological data: Please request your Primary MD to go over all hospital tests and procedure/radiological results at the follow up, please ask your Primary MD to get all Hospital records sent to his/her office.  In some cases, they will be blood work, cultures and biopsy results pending at the time of your discharge. Please request that your primary care M.D. follows up on these results.  Also Note the following: If you experience worsening of your admission symptoms, develop shortness of breath, life threatening emergency, suicidal or homicidal thoughts you must seek medical attention immediately by calling 911 or calling your MD immediately  if symptoms less severe.  You must read complete instructions/literature along with all the possible adverse reactions/side effects for all the Medicines you take and that have been prescribed to you. Take any new Medicines after you have completely understood and accpet all the possible adverse reactions/side effects.   Do not drive when taking Pain medications or sleeping medications (Benzodaizepines)  Do not take more than prescribed Pain, Sleep and Anxiety Medications. It is not advisable to combine anxiety,sleep and pain medications without talking with your primary care practitioner  Special Instructions: If  you have smoked or chewed Tobacco  in the last 2 yrs please stop smoking, stop any regular Alcohol  and or any Recreational drug use.  Wear Seat belts while driving.  Please note: You were cared for by a hospitalist during your hospital stay. Once you are discharged, your primary care physician will handle any further medical issues. Please note that NO REFILLS for any discharge medications will be authorized once you are discharged, as it is imperative that you return to your primary care physician (or establish a relationship with a primary care physician if you do not have one) for your post hospital discharge needs so that they can reassess your need for medications and monitor your lab values.   Increase activity slowly   Complete by: As directed    No wound care   Complete by: As directed       Allergies as of 08/29/2022       Reactions   Nsaids Other (See Comments)   Patient is to not take these because of kidney issues        Medication List     TAKE these medications    bumetanide 1 MG tablet Commonly known as: BUMEX Take 1 tablet (1 mg total) by mouth daily.   dapagliflozin propanediol 10 MG Tabs tablet Commonly known as: FARXIGA Take 10 mg by mouth in the morning.   ferrous sulfate 325 (65 FE) MG  tablet Take 325 mg by mouth every Monday, Wednesday, and Friday.   Glucosamine Sulfate 500 MG Tabs Take 500 mg by mouth daily.   levothyroxine 150 MCG tablet Commonly known as: SYNTHROID Take 150 mcg by mouth daily before breakfast.   nitroGLYCERIN 0.4 MG SL tablet Commonly known as: NITROSTAT Place 1 tablet (0.4 mg total) under the tongue every 5 (five) minutes as needed for up to 25 days for chest pain.   olopatadine 0.1 % ophthalmic solution Commonly known as: PATANOL Place 1 drop into both eyes daily as needed for allergies.   pantoprazole 40 MG tablet Commonly known as: PROTONIX Take 1 tablet (40 mg total) by mouth 2 (two) times daily.   potassium  chloride SA 20 MEQ tablet Commonly known as: KLOR-CON M Take 1 tablet (20 mEq total) by mouth 2 (two) times daily.   sodium chloride 0.65 % Soln nasal spray Commonly known as: OCEAN Place 1 spray into both nostrils as needed for congestion.   sucralfate 1 GM/10ML suspension Commonly known as: CARAFATE Take 10 mLs (1 g total) by mouth 4 (four) times daily -  with meals and at bedtime.   Vitamin D3 125 MCG (5000 UT) Caps Take 5,000 Units by mouth every morning.        Follow-up Information     [name], MD. Schedule an appointment as soon as possible for a visit in 1 week(s).   Specialty: Internal Medicine Contact information: [address] [place] [address] [phone]                Allergies  Allergen Reactions   Nsaids Other (See Comments)    Patient is to not take these because of kidney issues     Other Procedures/Studies: MR BRAIN WO CONTRAST  Result Date: 08/26/2022 CLINICAL DATA:  Altered mental status and increased confusion, patient with known A-fib not on anticoagulation due to GI bleed, evaluate for stroke EXAM: MRI HEAD WITHOUT CONTRAST TECHNIQUE: Multiplanar, multiecho pulse sequences of the brain and surrounding structures were obtained without intravenous contrast. COMPARISON:  CT head 08/24/2022. FINDINGS: Brain: No acute infarction, hemorrhage, hydrocephalus, extra-axial collection or mass lesion. Vascular: Major arterial flow voids are maintained at the skull base. Skull and upper cervical spine: Normal marrow signal. Sinuses/Orbits: Mostly clear sinuses.  No acute orbital findings. Other: Trace mastoid effusions. IMPRESSION: No evidence of acute intracranial abnormality. Electronically Signed   By: Feliberto Harts M.D.   On: 08/26/2022 15:44   CT HEAD WO CONTRAST ( )  Result Date: 08/24/2022 CLINICAL DATA:  Provided history: Acute onset of vision problem, atrial fibrillation, not on anticoagulation. EXAM: CT HEAD  WITHOUT CONTRAST TECHNIQUE: Contiguous axial images were obtained from the base of the skull through the vertex without intravenous contrast. RADIATION DOSE REDUCTION: This exam was performed according to the departmental dose-optimization program which includes automated exposure control, adjustment of the mA and/or kV according to patient size and/or use of iterative reconstruction technique. COMPARISON:  Brain MRI 08/06/2022.  Head CT 08/01/2022. FINDINGS: Brain: There is no acute intracranial hemorrhage. No demarcated cortical infarct. No extra-axial fluid collection. No evidence of an intracranial mass. No midline shift. Vascular: No hyperdense vessel.  Atherosclerotic calcifications. Skull: No calvarial fracture or aggressive osseous lesion. Sinuses/Orbits: No mass or acute finding within the imaged orbits. Opacification of a posterior left ethmoid air cell. IMPRESSION: 1. No evidence of an acute intracranial abnormality. 2. Left ethmoid sinus disease, as described. Electronically Signed   By: Jackey Loge  D.O.   On: 08/24/2022 11:02   DG Chest Portable 1 View  Result Date: 08/15/2022 CLINICAL DATA:  Altered mental status EXAM: PORTABLE CHEST 1 VIEW COMPARISON:  Chest x-ray 07/10/2022.  CT of the chest 02/07/2022. FINDINGS: Heart is mildly enlarged. There some interstitial opacities in both lung bases with a small left pleural effusion. Left pleural calcifications are again noted. There is no pneumothorax or acute fracture. IMPRESSION: Mild cardiomegaly with interstitial opacities in both lung bases and a small left pleural effusion. Findings may represent mild pulmonary edema or atypical infection. Electronically Signed   By: Darliss Cheney M.D.   On: 08/15/2022 19:35   MR BRAIN WO CONTRAST  Result Date: 08/07/2022 CLINICAL DATA:  Mental status change EXAM: MRI HEAD WITHOUT CONTRAST TECHNIQUE: Multiplanar, multiecho pulse sequences of the brain and surrounding structures were obtained without  intravenous contrast. COMPARISON:  No prior MRI available, correlation is made with CT head 08/01/2022 FINDINGS: Brain: No restricted diffusion to suggest acute or subacute infarct. No acute hemorrhage, mass, mass effect, or midline shift. No hydrocephalus or extra-axial collection. Normal pituitary and craniocervical junction. No hemosiderin deposition to suggest remote hemorrhage. Normal cerebral volume for age. Vascular: Normal arterial flow voids. Skull and upper cervical spine: Normal marrow signal. Sinuses/Orbits: Mucosal thickening in the ethmoid air cells. Status post bilateral lens replacements. Other: Fluid in the mastoid air cells. IMPRESSION: No acute intracranial process. No evidence of acute or subacute infarct. Electronically Signed   By: Wiliam Ke M.D.   On: 08/07/2022 02:09   DG ERCP  Result Date: 08/06/2022 CLINICAL DATA:  [id number] Surgery, elective [id number] EXAM: ERCP COMPARISON:  CT AP, 07/07/2022. FLUOROSCOPY: Exposure Index (as provided by the fluoroscopic device): 20.5 mGy Kerma FINDINGS: Limited oblique planar images of the RIGHT upper quadrant obtained C-arm. Images demonstrating flexible endoscopy, biliary duct cannulation, sphincterotomy, retrograde cholangiogram and balloon sweep. A metallic biliary stent is placed at the end of the case. IMPRESSION: Fluoroscopic imaging for ERCP and metallic biliary stent placement. For complete description of intra procedural findings, please see performing service dictation. Electronically Signed   By: Roanna Banning M.D.   On: 08/06/2022 17:08   DG Abd Portable 2V  Result Date: 08/02/2022 CLINICAL DATA:  Biliary stent migration. EGD today. Previously placed biliary stent was not visible via scope. EXAM: PORTABLE ABDOMEN - 2 VIEW COMPARISON:  CT 07/07/2022 FINDINGS: The previously seen biliary stent has migrated and is now located in the right lower quadrant. This could be within the distal small bowel or proximal colon. No free air, organomegaly,  or bowel obstruction. No confluent opacity in the lungs. IMPRESSION: Biliary stent has migrated into the right lower quadrant, possibly within the distal small bowel or proximal colon. Electronically Signed   By: Charlett Nose M.D.   On: 08/02/2022 17:22   CT Head Wo Contrast  Result Date: 08/01/2022 CLINICAL DATA:  Altered mental status EXAM: CT HEAD WITHOUT CONTRAST TECHNIQUE: Contiguous axial images were obtained from the base of the skull through the vertex without intravenous contrast. RADIATION DOSE REDUCTION: This exam was performed according to the departmental dose-optimization program which includes automated exposure control, adjustment of the mA and/or kV according to patient size and/or use of iterative reconstruction technique. COMPARISON:  None Available. FINDINGS: Brain: No evidence of acute infarction, hemorrhage, hydrocephalus, extra-axial collection or mass lesion/mass effect. Vascular: No hyperdense vessel or unexpected calcification. Skull: Normal. Negative for fracture or focal lesion. Sinuses/Orbits: No acute finding. Other: None. IMPRESSION: No acute abnormality noted. Electronically  Signed   By: Alcide Clever M.D.   On: 08/01/2022 16:15     TODAY-DAY OF DISCHARGE:  Subjective:   Charlesetta Ivory today has no headache,no chest abdominal pain,no new weakness tingling or numbness, feels much better wants to go home today.   Objective:   Blood pressure (!) 113/42, pulse 76, temperature 98.2 F (36.8 C), resp. rate 20, height 5\' 8"  (1.727 m), weight 90.6 kg, SpO2 99%.  Intake/Output Summary (Last 24 hours) at 08/29/2022 0904 Last data filed at 08/28/2022 1900 Gross per 24 hour  Intake 240 ml  Output 600 ml  Net -360 ml   Filed Weights   08/15/22 1844 08/27/22 0137 08/29/22 0058  Weight: 98 kg 87.2 kg 90.6 kg    Exam: Awake Alert, Oriented *3, No new F.N deficits, Normal affect [place].AT,PERRAL Supple Neck,No JVD, No cervical lymphadenopathy appriciated.  Symmetrical Chest wall  movement, Good air movement bilaterally, CTAB RRR,No Gallops,Rubs or new Murmurs, No Parasternal Heave +ve B.Sounds, Abd Soft, Non tender, No organomegaly appriciated, No rebound -guarding or rigidity. No Cyanosis, Clubbing or edema, No new Rash or bruise   PERTINENT RADIOLOGIC STUDIES: No results found.   PERTINENT LAB RESULTS: CBC: Recent Labs    08/28/22 0509 08/29/22 0123  WBC 6.1 4.8  HGB 8.9* 8.2*  HCT 27.4* 26.8*  PLT 116* 164   CMET CMP     Component Value Date/Time   NA 135 08/29/2022 0123   K 4.0 08/29/2022 0123   CL 110 08/29/2022 0123   CO2 20 (L) 08/29/2022 0123   GLUCOSE 96 08/29/2022 0123   BUN 21 08/29/2022 0123   CREATININE 1.14 08/29/2022 0123   CREATININE 1.57 (H) 08/01/2022 1228   CALCIUM 8.3 (L) 08/29/2022 0123   PROT 5.4 (L) 08/07/2022 0554   ALBUMIN 2.2 (L) 08/07/2022 0554   AST 30 08/07/2022 0554   AST 28 08/01/2022 1228   ALT 18 08/07/2022 0554   ALT 15 08/01/2022 1228   ALKPHOS 56 08/07/2022 0554   BILITOT 0.6 08/07/2022 0554   BILITOT 0.7 08/01/2022 1228   GFR 33.16 (L) 01/15/2022 1143   GFRNONAA >60 08/29/2022 0123   GFRNONAA 42 (L) 08/01/2022 1228    GFR Estimated Creatinine Clearance: 48 mL/min (by C-G formula based on SCr of 1.14 mg/dL). No results for input(s): "LIPASE", "AMYLASE" in the last 72 hours. No results for input(s): "CKTOTAL", "CKMB", "CKMBINDEX", "TROPONINI" in the last 72 hours. Invalid input(s): "POCBNP" No results for input(s): "DDIMER" in the last 72 hours. No results for input(s): "HGBA1C" in the last 72 hours. No results for input(s): "CHOL", "HDL", "LDLCALC", "TRIG", "CHOLHDL", "LDLDIRECT" in the last 72 hours. No results for input(s): "TSH", "T4TOTAL", "T3FREE", "THYROIDAB" in the last 72 hours.  Invalid input(s): "FREET3" No results for input(s): "VITAMINB12", "FOLATE", "FERRITIN", "TIBC", "IRON", "RETICCTPCT" in the last 72 hours. Coags: No results for input(s): "INR" in the last 72 hours.  Invalid  input(s): "PT" Microbiology: No results found for this or any previous visit (from the past 240 hour(s)).  FURTHER DISCHARGE INSTRUCTIONS:  Get Medicines reviewed and adjusted: Please take all your medications with you for your next visit with your Primary MD  Laboratory/radiological data: Please request your Primary MD to go over all hospital tests and procedure/radiological results at the follow up, please ask your Primary MD to get all Hospital records sent to his/her office.  In some cases, they will be blood work, cultures and biopsy results pending at the time of your discharge. Please request that  your primary care M.D. goes through all the records of your hospital data and follows up on these results.  Also Note the following: If you experience worsening of your admission symptoms, develop shortness of breath, life threatening emergency, suicidal or homicidal thoughts you must seek medical attention immediately by calling 911 or calling your MD immediately  if symptoms less severe.  You must read complete instructions/literature along with all the possible adverse reactions/side effects for all the Medicines you take and that have been prescribed to you. Take any new Medicines after you have completely understood and accpet all the possible adverse reactions/side effects.   Do not drive when taking Pain medications or sleeping medications (Benzodaizepines)  Do not take more than prescribed Pain, Sleep and Anxiety Medications. It is not advisable to combine anxiety,sleep and pain medications without talking with your primary care practitioner  Special Instructions: If you have smoked or chewed Tobacco  in the last 2 yrs please stop smoking, stop any regular Alcohol  and or any Recreational drug use.  Wear Seat belts while driving.  Please note: You were cared for by a hospitalist during your hospital stay. Once you are discharged, your primary care physician will handle any further  medical issues. Please note that NO REFILLS for any discharge medications will be authorized once you are discharged, as it is imperative that you return to your primary care physician (or establish a relationship with a primary care physician if you do not have one) for your post hospital discharge needs so that they can reassess your need for medications and monitor your lab values.  Total Time spent coordinating discharge including counseling, education and face to face time equals greater than 30 minutes.  SignedJeoffrey Massed 08/29/2022 9:04 AM

## 2022-08-30 DIAGNOSIS — E039 Hypothyroidism, unspecified: Secondary | ICD-10-CM | POA: Diagnosis not present

## 2022-08-30 DIAGNOSIS — K922 Gastrointestinal hemorrhage, unspecified: Secondary | ICD-10-CM | POA: Diagnosis not present

## 2022-08-30 DIAGNOSIS — I2510 Atherosclerotic heart disease of native coronary artery without angina pectoris: Secondary | ICD-10-CM | POA: Diagnosis not present

## 2022-08-30 DIAGNOSIS — G9341 Metabolic encephalopathy: Secondary | ICD-10-CM | POA: Diagnosis not present

## 2022-08-30 DIAGNOSIS — R2681 Unsteadiness on feet: Secondary | ICD-10-CM | POA: Diagnosis not present

## 2022-08-30 DIAGNOSIS — I4891 Unspecified atrial fibrillation: Secondary | ICD-10-CM | POA: Diagnosis not present

## 2022-09-02 ENCOUNTER — Telehealth: Payer: Self-pay | Admitting: Hematology

## 2022-09-03 DIAGNOSIS — Z515 Encounter for palliative care: Secondary | ICD-10-CM | POA: Diagnosis not present

## 2022-09-04 ENCOUNTER — Other Ambulatory Visit: Payer: Self-pay

## 2022-09-04 ENCOUNTER — Inpatient Hospital Stay (HOSPITAL_BASED_OUTPATIENT_CLINIC_OR_DEPARTMENT_OTHER): Payer: PPO | Admitting: Nurse Practitioner

## 2022-09-04 ENCOUNTER — Inpatient Hospital Stay: Payer: PPO | Attending: Physician Assistant

## 2022-09-04 ENCOUNTER — Encounter: Payer: Self-pay | Admitting: Nurse Practitioner

## 2022-09-04 ENCOUNTER — Inpatient Hospital Stay: Payer: PPO

## 2022-09-04 ENCOUNTER — Encounter: Payer: Self-pay | Admitting: Hematology

## 2022-09-04 VITALS — BP 124/60 | HR 73 | Temp 97.5°F | Resp 14 | Wt 199.0 lb

## 2022-09-04 DIAGNOSIS — D649 Anemia, unspecified: Secondary | ICD-10-CM

## 2022-09-04 DIAGNOSIS — D500 Iron deficiency anemia secondary to blood loss (chronic): Secondary | ICD-10-CM | POA: Diagnosis not present

## 2022-09-04 DIAGNOSIS — D519 Vitamin B12 deficiency anemia, unspecified: Secondary | ICD-10-CM | POA: Diagnosis not present

## 2022-09-04 DIAGNOSIS — Z5112 Encounter for antineoplastic immunotherapy: Secondary | ICD-10-CM | POA: Diagnosis not present

## 2022-09-04 DIAGNOSIS — C241 Malignant neoplasm of ampulla of Vater: Secondary | ICD-10-CM

## 2022-09-04 DIAGNOSIS — K922 Gastrointestinal hemorrhage, unspecified: Secondary | ICD-10-CM | POA: Diagnosis not present

## 2022-09-04 LAB — CBC WITH DIFFERENTIAL/PLATELET
Abs Immature Granulocytes: 0.01 10*3/uL (ref 0.00–0.07)
Basophils Absolute: 0 10*3/uL (ref 0.0–0.1)
Basophils Relative: 1 %
Eosinophils Absolute: 0.1 10*3/uL (ref 0.0–0.5)
Eosinophils Relative: 1 %
HCT: 22 % — ABNORMAL LOW (ref 39.0–52.0)
Hemoglobin: 7.2 g/dL — ABNORMAL LOW (ref 13.0–17.0)
Immature Granulocytes: 0 %
Lymphocytes Relative: 13 %
Lymphs Abs: 0.6 10*3/uL — ABNORMAL LOW (ref 0.7–4.0)
MCH: 32.4 pg (ref 26.0–34.0)
MCHC: 32.7 g/dL (ref 30.0–36.0)
MCV: 99.1 fL (ref 80.0–100.0)
Monocytes Absolute: 0.5 10*3/uL (ref 0.1–1.0)
Monocytes Relative: 10 %
Neutro Abs: 3.6 10*3/uL (ref 1.7–7.7)
Neutrophils Relative %: 75 %
Platelets: 177 10*3/uL (ref 150–400)
RBC: 2.22 MIL/uL — ABNORMAL LOW (ref 4.22–5.81)
RDW: 26.5 % — ABNORMAL HIGH (ref 11.5–15.5)
WBC: 4.8 10*3/uL (ref 4.0–10.5)
nRBC: 0.4 % — ABNORMAL HIGH (ref 0.0–0.2)

## 2022-09-04 LAB — SAMPLE TO BLOOD BANK

## 2022-09-04 LAB — TYPE AND SCREEN
ABO/RH(D): A NEG
Antibody Screen: NEGATIVE
Unit division: 0

## 2022-09-04 LAB — BPAM RBC
Blood Product Expiration Date: 202409042359
ISSUE DATE / TIME: 202408081325
Unit Type and Rh: 600

## 2022-09-04 LAB — COMPREHENSIVE METABOLIC PANEL
ALT: 16 U/L (ref 0–44)
AST: 20 U/L (ref 15–41)
Albumin: 2.9 g/dL — ABNORMAL LOW (ref 3.5–5.0)
Alkaline Phosphatase: 87 U/L (ref 38–126)
Anion gap: 7 (ref 5–15)
BUN: 34 mg/dL — ABNORMAL HIGH (ref 8–23)
CO2: 25 mmol/L (ref 22–32)
Calcium: 8.7 mg/dL — ABNORMAL LOW (ref 8.9–10.3)
Chloride: 109 mmol/L (ref 98–111)
Creatinine, Ser: 1.63 mg/dL — ABNORMAL HIGH (ref 0.61–1.24)
GFR, Estimated: 40 mL/min — ABNORMAL LOW (ref >60)
Glucose, Bld: 121 mg/dL — ABNORMAL HIGH (ref 70–99)
Potassium: 4 mmol/L (ref 3.5–5.1)
Sodium: 141 mmol/L (ref 135–145)
Total Bilirubin: 0.4 mg/dL (ref 0.3–1.2)
Total Protein: 5.9 g/dL — ABNORMAL LOW (ref 6.5–8.1)

## 2022-09-04 LAB — FERRITIN: Ferritin: 626 ng/mL — ABNORMAL HIGH (ref 24–336)

## 2022-09-04 LAB — PREPARE RBC (CROSSMATCH)

## 2022-09-04 MED ORDER — SODIUM CHLORIDE 0.9% IV SOLUTION
250.0000 mL | Freq: Once | INTRAVENOUS | Status: AC
  Administered 2022-09-04: 250 mL via INTRAVENOUS

## 2022-09-04 NOTE — Progress Notes (Signed)
Verbal order given to administer 1 unit of PRBC from Dr. Mosetta Putt spoke with Tresa Endo in blood bank type and screen order received patient will have 1 unit of PRBC

## 2022-09-04 NOTE — Patient Instructions (Signed)
Blood Transfusion, Adult A blood transfusion is a procedure in which you receive blood or a type of blood cell (blood component) through an IV. You may need a blood transfusion when you have a low blood count, which is a low number of any blood cell. This may result from a bleeding disorder, illness, injury, or surgery. The blood may come from a donor, or you may be able to have your own blood collected and stored (autologous blood donation) before a planned surgery. The blood given in a transfusion may be made up of different blood components. You may receive: Red blood cells. These carry oxygen to the cells in the body. Platelets. These help your blood to clot. Plasma. This is the liquid part of your blood. It carries proteins and other substances throughout the body. White blood cells. These help you fight infections. If you have hemophilia or another clotting disorder, you may also receive other types of blood products. Depending on the type of blood product, this procedure may take 1-4 hours to complete. Tell a health care provider about: Any bleeding problems you have. Any previous reactions you have had during a blood transfusion. Any allergies you have. All medicines you are taking, including vitamins, herbs, eye drops, creams, and over-the-counter medicines. Any surgeries you have had. Any medical conditions you have. Whether you are pregnant or may be pregnant. What are the risks? Talk with your health care provider about risks. The most common problems include: A mild allergic reaction, such as red, swollen areas of skin (hives) and itching. Fever or chills. This may be the body's response to new blood cells received. This may occur during or up to 4 hours after the transfusion. More serious problems may include: A serious allergic reaction that causes difficulty breathing or swelling around the face and lips. Transfusion-associated circulatory overload (TACO), or too much fluid in  the lungs. This may cause breathing problems. Transfusion-related acute lung injury (TRALI), which causes breathing difficulty and low oxygen in the blood. This can occur within hours of the transfusion or several days later. Iron overload. This can happen after receiving many blood transfusions over a period of time. Infection or virus being transmitted. This is rare because donated blood is carefully tested before it is given. Hemolytic transfusion reaction. This is rare. It happens when the body's defense system (immune system)tries to attack the new blood cells. Symptoms may include fever, chills, nausea, low blood pressure, and low back or chest pain. Transfusion-associated graft-versus-host disease (TAGVHD). This is rare. It happens when donated cells attack the body's healthy tissues. What happens before the procedure? You will have a blood test to check your blood type. This test is done to know what kind of blood your body will accept and to match it to the donor blood. If you are going to have a planned surgery, you may be able to do an autologous blood donation. This may be done in case you need to have a transfusion. You will have your temperature, blood pressure, and pulse checked before the transfusion. If you have had an allergic reaction to a transfusion in the past, you may be given medicine to help prevent a reaction. This medicine may be given to you by mouth (orally) or through an IV. What happens during the procedure?  An IV will be inserted into one of your veins. The bag of blood will be attached to your IV. The blood will then enter through your vein. Your temperature, blood pressure,   and pulse will be monitored during the transfusion. This monitoring is done to detect early signs of a transfusion reaction. Tell your nurse right away if you have any of these symptoms during the transfusion: Shortness of breath or trouble breathing. Chest or back pain. Fever or  chills. Itching or hives. If you have any signs or symptoms of a reaction, your transfusion will be stopped and you may be given medicine. When the transfusion is complete, your IV will be removed. Pressure may be applied to the IV site for a few minutes. A bandage (dressing)will be applied. The procedure may vary among health care providers and hospitals. What happens after the procedure? Your temperature, blood pressure, pulse, breathing rate, and blood oxygen level will be monitored until you leave the hospital or clinic. Your blood may be tested to see how you have responded to the transfusion. You may be warmed with fluids or blankets to maintain a normal body temperature. If you receive your blood transfusion in an outpatient setting, you will be told whom to contact to report any reactions. Where to find more information Visit the [employer]: redcross.org Summary A blood transfusion is a procedure in which you receive blood or a type of blood cell (blood component) through an IV. The blood given in a transfusion may be made up of different blood components. You may receive red blood cells, platelets, plasma, or white blood cells depending on the condition treated. Your temperature, blood pressure, and pulse will be monitored before, during, and after the transfusion. After the transfusion, your blood may be tested to see how your body has responded. This information is not intended to replace advice given to you by your health care provider. Make sure you discuss any questions you have with your health care provider. Document Revised: 04/12/2021 Document Reviewed: 04/12/2021 Elsevier Patient Education  2024 Elsevier Inc.  

## 2022-09-04 NOTE — Progress Notes (Addendum)
Patient Care Team: [name], MD as PCP - General (Internal Medicine) [name], MD as Consulting Physician (General Surgery) [name], MD as Consulting Physician (Gastroenterology) [name], MD as Consulting Physician (Otolaryngology) [name], MD as Consulting Physician (Cardiology) Glyn Ade, PA-C as Physician Assistant (Dermatology) Malachy Mood, MD as Consulting Physician (Oncology)   CHIEF COMPLAINT: Follow up ampullary cancer, acute on chronic anemia, recurrent GIB  Oncology History  Cancer of ampulla of Vater (HCC)  12/06/2021 Imaging   CT ABDOMEN PELVIS W CONTRAST   IMPRESSION: 1. There is moderate intrahepatic bile duct dilatation with marked fusiform dilatation of the common bile duct. No CT visible common bile duct stones identified. Differential considerations include a obstructing distal common bile duct stone (not visible by CT), distal CBD stricture, or mass at the level of the ampullary. Consider further evaluation with contrast enhanced MRI/MRCP or ERCP. 2. Contour the liver appears nodular which may reflect underlying cirrhosis. 3. Age-indeterminate compression fracture involving L1 vertebral body with loss of 50% of the vertebral body height. No signs of retropulsion of fracture fragments into the canal. 4. Fat containing umbilical hernia. 5. 5 mm anterolisthesis of L4 on L5. 6.  Aortic Atherosclerosis (ICD10-I70.0).   12/16/2021 Imaging   MR ABDOMEN MRCP W WO CONTAST   IMPRESSION: 1. Moderate intrahepatic and extrahepatic bile duct dilation with smooth tapering to the level of the ampulla without filling defect or focal mass lesion identified. Findings may reflect ampullary stricture or occult ampullary mass. Consider further evaluation with ERCP. 2. Mild dilated main pancreatic duct at the head measuring up to 8 mm. 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 Recommend follow-up pre and post-contrast MRI/MRCP in 2 years. This recommendation follows ACR consensus guidelines: Management of Incidental Pancreatic Cysts: A White Paper of the ACR Incidental Findings Committee. J Am Coll Radiol 2017;14:911-923. 3.  Aortic Atherosclerosis (ICD10-I70.0).   01/23/2022 Procedure   DG ERCP   IMPRESSION: Dilatation of the extrahepatic bile duct with severe tapering or narrowing in the distal common bile duct. Placement of biliary stent.   These images were submitted for radiologic interpretation only. Please see the procedural report for the amount of contrast.    01/23/2022 Procedure   EUS/ERCP  ENDOSONOGRAPHIC FINDING: There was dilation in the common bile duct (12.3 mm -> 20.4 mm) and in the common hepatic duct (23.0 mm). A small amount of hyperechoic material consistent with sludge was visualized endosonographically in the common bile duct. Moderate hyperechoic material consistent with sludge was visualized endosonographically in the gallbladder with normal gallbladder wall thickness. Pancreatic parenchymal abnormalities were noted in the entire pancreas. These consisted of hyperechoic foci with shadowing and cysts. There was a 9.1 mm by 6.1 mm cyst in the neck of the pancreas. There was a 15.3 mm by 14.1 mm cyst in the tail of the pancreas. The pancreatic duct had a dilated endosonographic appearance, had a prominently branched endosonographic appearance and had hyperechoic walls in the pancreatic head (PD - 8.2 mm -> 4.3 mm), genu of the pancreas (3.5 mm), body of the pancreas (3.2 mm) and tail of the pancreas (2.8 mm). A hypoechoic irregular lesion was identified endosonographically at the ampulla. The lesion measured 15 mm by 13 mm in maximal cross-sectional diameter. The lesion extended from the mucosa to the submucosa. The outer margins were irregular. This is noted where CBD and PD dilation is noted  to occur. Endosonographic imaging in the visualized portion of the liver showed no mass.  No malignant-appearing lymph nodes were visualized in the celiac region (level 20), peripancreatic region and porta hepatis region. The celiac region was visualized.   01/23/2022 Pathology Results   SURGICAL PATHOLOGY  CASE: [id number]  PATIENT: Charlesetta Ivory  Surgical Pathology Report   FINAL MICROSCOPIC DIAGNOSIS:   A. STOMACH, RANDOM, BIOPSY:  - Gastric mucosa, no significant abnormality.  No inflammation,  intestinal metaplasia, dysplasia or malignancy.   B. AMPULLARY LESION, BIOPSY:  - Adenocarcinoma.  See comment.    01/31/2022 Initial Diagnosis   Cancer of ampulla of Vater (HCC)   02/07/2022 Imaging    IMPRESSION: Interval increase in size of lymph node in the anterior cardiophrenic space on the right.   Increase small right pleural effusion compared to the previous MRI.   Subtle nodular tissue along the margin of the right hepatic lobe in the upper abdomen posteriorly. Recommend additional workup when appropriate.   Calcified pleural plaques.   Evidence of chronic liver disease.   Aortic Atherosclerosis (ICD10-I70.0).   02/14/2022 Miscellaneous   Foundation One:  Biomarker Findings Microsatellite status-Cannot be determined Tumor Mutation Burden-Cannot be determined  Genomics Findings EPHB4 amplificatio SF3B1 A213Y TP53 R273H        INTERVAL HISTORY Mr. Ditman presents for hospital f/up. Last seen by Dr. Mosetta Putt 06/12/22. Hospitalized 3 times since then most recently from 08/15/22 - 08/29/22 for acute on chronic anemia secondary to recurrent GIB from AVM, GAVE and duodenal mucosal friability felt to be from radiation and irritation from metal stent. He is on PPI, carafate, and oral iron 3x per week. He received multiple blood transfusions, IV iron, and Aranesp. He has been in [facility] rehab for a week, with daily PT/OT. When he got there couldn't even sit up in the bed  independently now he can. Still weak. Still seeing black stools but only 1 episode of "fresh blood." Denies pain.   ROS   Past Medical History:  Diagnosis Date   Anal fistula    Arthritis    Fingers and hands   Atrial fibrillation (HCC)    AVM (arteriovenous malformation)    Clipped during Colonoscopy 05/2016   Barrett's esophagus    Bilateral cataracts    BPH (benign prostatic hyperplasia)    Cecal angiodysplasia 05/28/2016   ablated at colonoscopy   Deviated nasal septum    Diverticulosis of sigmoid colon    E. 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   Left Forehead (treatment after biopsy)   OSA on CPAP    Perianal rash    Recurrent epistaxis    Renal cyst 01/04/2013   Small left peripelvic renal cysts , noted on US Renal   SCCA (squamous cell carcinoma) of skin 10/17/2015   Left Sup Bridge of Nose (curet, cautery and 5FU)   Seasonal allergies    Superficial basal cell carcinoma (BCC) 10/17/2015   Left Bulb of Nose (curet, cautery and 5FU)   Tubular adenoma      Past Surgical History:  Procedure Laterality Date   BILIARY STENT PLACEMENT N/A 01/23/2022   Procedure: BILIARY STENT PLACEMENT;  Surgeon: Lemar Lofty., MD;  Location: [facility];  Service: Gastroenterology;  Laterality: N/A;   BILIARY STENT PLACEMENT N/A 08/06/2022   Procedure:  BILIARY STENT PLACEMENT;  Surgeon: Lynann Bologna, MD;  Location: [facility];  Service: Gastroenterology;  Laterality: N/A;   BIOPSY  01/23/2022   Procedure: BIOPSY;  Surgeon: Meridee Score Netty Starring., MD;  Location: [facility];  Service: Gastroenterology;;   BIOPSY  07/09/2022   Procedure: BIOPSY;  Surgeon: Shellia Cleverly, DO;  Location: [facility];  Service: Gastroenterology;;   BIOPSY  08/02/2022   Procedure: BIOPSY;  Surgeon: Sherrilyn Rist, MD;  Location:  [facility];  Service: Gastroenterology;;   COLONOSCOPY  multiple   CYSTOSCOPY     ENDOSCOPIC RETROGRADE CHOLANGIOPANCREATOGRAPHY (ERCP) WITH PROPOFOL N/A 01/23/2022   Procedure: ENDOSCOPIC RETROGRADE CHOLANGIOPANCREATOGRAPHY (ERCP) WITH PROPOFOL;  Surgeon: Lemar Lofty., MD;  Location: [facility];  Service: Gastroenterology;  Laterality: N/A;   ENTEROSCOPY N/A 08/02/2022   Procedure: ENTEROSCOPY;  Surgeon: Sherrilyn Rist, MD;  Location: [facility];  Service: Gastroenterology;  Laterality: N/A;   ERCP N/A 08/06/2022   Procedure: ENDOSCOPIC RETROGRADE CHOLANGIOPANCREATOGRAPHY (ERCP);  Surgeon: Lynann Bologna, MD;  Location: [facility];  Service: Gastroenterology;  Laterality: N/A;   ESOPHAGOGASTRODUODENOSCOPY  multiple   ESOPHAGOGASTRODUODENOSCOPY N/A 01/23/2022   Procedure: ESOPHAGOGASTRODUODENOSCOPY (EGD);  Surgeon: Lemar Lofty., MD;  Location: [facility];  Service: Gastroenterology;  Laterality: N/A;   ESOPHAGOGASTRODUODENOSCOPY N/A 07/09/2022   Procedure: ESOPHAGOGASTRODUODENOSCOPY (EGD);  Surgeon: Shellia Cleverly, DO;  Location: [facility] ENDOSCOPY;  Service: Gastroenterology;  Laterality: N/A;   ESOPHAGOGASTRODUODENOSCOPY N/A 08/21/2022   Procedure: ESOPHAGOGASTRODUODENOSCOPY (EGD);  Surgeon: Jenel Lucks, MD;  Location: [facility] ENDOSCOPY;  Service: Gastroenterology;  Laterality: N/A;   EUS N/A 01/23/2022   Procedure: UPPER ENDOSCOPIC ULTRASOUND (EUS) RADIAL;  Surgeon: Lemar Lofty., MD;  Location: [facility];  Service: Gastroenterology;  Laterality: N/A;   EVALUATION UNDER ANESTHESIA WITH FISTULECTOMY N/A 03/02/2018   Procedure: ANORECTAL EXAM UNDER ANESTHESIA WITH REPAIR OF SUPERFICIAL PERIRECTAL FISTULA AND HEMORRHOIDECTOMY;  Surgeon: [name], MD;  Location: [facility];  Service: General;  Laterality: N/A;   EXPLORATORY LAPAROTOMY     HEMOSTASIS CLIP PLACEMENT  07/09/2022   Procedure: HEMOSTASIS CLIP PLACEMENT;  Surgeon: Shellia Cleverly, DO;   Location: [facility];  Service: Gastroenterology;;   HOT HEMOSTASIS N/A 08/02/2022   Procedure: HOT HEMOSTASIS (ARGON PLASMA COAGULATION/BICAP);  Surgeon: Sherrilyn Rist, MD;  Location: [facility];  Service: Gastroenterology;  Laterality: N/A;   HOT HEMOSTASIS N/A 08/21/2022   Procedure: HOT HEMOSTASIS (ARGON PLASMA COAGULATION/BICAP);  Surgeon: Jenel Lucks, MD;  Location[facility];  Service: Gastroenterology;  Laterality: N/A;   IR THORACENTESIS ASP PLEURAL SPACE W/IMG GUIDE  07/10/2022   REMOVAL OF STONES  01/23/2022   Procedure: REMOVAL OF STONES;  Surgeon: Meridee Score Netty Starring., MD;  Location: [facility];  Service: Gastroenterology;;   REMOVAL OF STONES  08/06/2022   Procedure: REMOVAL OF STONES;  Surgeon: Lynann Bologna, MD;  Location: [facility];  Service: Gastroenterology;;   Dennison Mascot  01/23/2022   Procedure: Dennison Mascot;  Surgeon: Lemar Lofty., MD;  Location: [facility];  Service: Gastroenterology;;     Outpatient Encounter Medications as of 09/04/2022  Medication Sig   bumetanide (BUMEX) 1 MG tablet Take 1 tablet (1 mg total) by mouth daily.   Cholecalciferol (VITAMIN D3) 5000 units CAPS Take 5,000 Units by mouth every morning.   dapagliflozin propanediol (FARXIGA) 10 MG TABS tablet Take 10 mg by mouth in the morning.   ferrous sulfate 325 (65 FE) MG tablet Take 325 mg by mouth every Monday, Wednesday, and Friday.   Glucosamine Sulfate 500 MG TABS  Take 500 mg by mouth daily.   levothyroxine (SYNTHROID) 150 MCG tablet Take 150 mcg by mouth daily before breakfast.   nitroGLYCERIN (NITROSTAT) 0.4 MG SL tablet Place 1 tablet (0.4 mg total) under the tongue every 5 (five) minutes as needed for up to 25 days for chest pain.   olopatadine (PATANOL) 0.1 % ophthalmic solution Place 1 drop into both eyes daily as needed for allergies.   pantoprazole (PROTONIX) 40 MG tablet Take 1 tablet (40 mg total) by mouth 2 (two) times daily.   potassium chloride SA  (KLOR-CON M) 20 MEQ tablet Take 1 tablet (20 mEq total) by mouth 2 (two) times daily.   sodium chloride (OCEAN) 0.65 % SOLN nasal spray Place 1 spray into both nostrils as needed for congestion.   sucralfate (CARAFATE) 1 GM/10ML suspension Take 10 mLs (1 g total) by mouth 4 (four) times daily -  with meals and at bedtime.   No facility-administered encounter medications on file as of 09/04/2022.     Today's Vitals   09/04/22 1209  BP: 124/60  Pulse: 73  Resp: 14  Temp: (!) 97.5 F (36.4 C)  SpO2: 100%  Weight: 199 lb (90.3 kg)   Body mass index is 30.26 kg/m.   PHYSICAL EXAM GENERAL:alert, no distress and comfortable EYES: sclera clear LUNGS: clear with normal breathing effort HEART: regular rate & rhythm, mild bilateral lower extremity edema ABDOMEN: abdomen soft, non-tender and normal bowel sounds NEURO: alert & oriented x 3 with fluent speech. Exam performed in wheelchair  CBC    Component Value Date/Time   WBC 4.8 09/04/2022 1027   RBC 2.22 (L) 09/04/2022 1027   HGB 7.2 (L) 09/04/2022 1027   HGB 10.4 (L) 02/21/2022 0811   HCT 22.0 (L) 09/04/2022 1027   PLT 177 09/04/2022 1027   PLT 158 02/21/2022 0811   MCV 99.1 09/04/2022 1027   MCH 32.4 09/04/2022 1027   MCHC 32.7 09/04/2022 1027   RDW 26.5 (H) 09/04/2022 1027   LYMPHSABS 0.6 (L) 09/04/2022 1027   MONOABS 0.5 09/04/2022 1027   EOSABS 0.1 09/04/2022 1027   BASOSABS 0.0 09/04/2022 1027     CMP     Component Value Date/Time   NA 141 09/04/2022 1027   K 4.0 09/04/2022 1027   CL 109 09/04/2022 1027   CO2 25 09/04/2022 1027   GLUCOSE 121 (H) 09/04/2022 1027   BUN 34 (H) 09/04/2022 1027   CREATININE 1.63 (H) 09/04/2022 1027   CREATININE 1.57 (H) 08/01/2022 1228   CALCIUM 8.7 (L) 09/04/2022 1027   PROT 5.9 (L) 09/04/2022 1027   ALBUMIN 2.9 (L) 09/04/2022 1027   AST 20 09/04/2022 1027   AST 28 08/01/2022 1228   ALT 16 09/04/2022 1027   ALT 15 08/01/2022 1228   ALKPHOS 87 09/04/2022 1027   BILITOT 0.4  09/04/2022 1027   BILITOT 0.7 08/01/2022 1228   GFRNONAA 40 (L) 09/04/2022 1027   GFRNONAA 42 (L) 08/01/2022 1228   GFRAA 55 (L) 02/23/2018 1345     ASSESSMENT & PLAN: AKI DIBELLA is a 89 y.o. male with    Cancer of ampulla of Vater (HCC) -diagnosed in 12/2021, s/p ERCP and stenting -PET showed no evidence of distant metastasis  -He was evaluated by pancreatobiliary surgeon Dr. Donell Beers, and felt to be a poor candidate for Whipple surgery. -Not a good candidate for chemo, even low dose/single agent chemo such as Xeloda -S/p radiation 03/2022, Currently on observation -Mr. Neloms appears frail and weak, but  stable for outpatient management. 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  -Path did not confirm residual cancer. Findings felt to be secondary to radiation and irritation from stent -He has been supported with blood and iron infusions, currently on oral iron 3x/week. Discharge Hgb 8.2 -Today's hgb down to 7.2, continues to see black stools (partially from oral iron). He will receive 1 u pRBCs today -Pt seen with Dr. Mosetta Putt who discussed the use of Bevacizumab in recurrent GIB, potential SE's and efficacy. Pt is open to it, we called his daughter who also agrees.  -Plan to give Beva q2 weeks x4 and monitor hgb closely to gauge his response -Lab weekly with RBC for hgb <8 PRN -F/up with first treatment     PLAN: [facility] records, endoscopies, and today's labs reviewed -Hgb 7.2, symptomatic will receive 1 u pRBC today -Begin Bevacizumab q2 weeks x4 -F/up with first treatment  -Pt seen with Dr. Mosetta Putt    All questions were answered. The patient knows to call the clinic with any problems, questions or concerns. No barriers to learning were detected.   Santiago Glad, NP-C 09/04/2022  Addendum I have seen the patient, examined him. I agree with the assessment and and plan and have edited  the notes.   Mr. Zappa has been admitted to hospital for recurrent severe GI bleeding several times in the past 2 months.  He has received multiple units of blood transfusion and IV iron.  His GI bleeding is likely related to the AVM and radiation duodenitis.  Due to the persistent GI bleeding, I discussed the benefit of VEGFR inhibitor bevacizumab, to reduce his AVM related GI bleeding.  Potential benefit and side effect, especially risk of thrombosis (he has AF and not on [facility]), bleeding, small risk of bowel perforation, hypertension, proteinuria, we discussed with him and his daughter on the phone, in great detail.  Patient and his daughter agreed to proceed.  We will see if we can get insurance approval, and plan to start in 1 to 2 weeks.  Plan to give every 2 weeks for 4 doses, to see how he responds.  Meantime we will continue CBC, blood transfusion, and IV iron as needed to support his anemia.  Malachy Mood MD 09/04/2022

## 2022-09-05 ENCOUNTER — Other Ambulatory Visit: Payer: Self-pay

## 2022-09-08 ENCOUNTER — Telehealth: Payer: Self-pay | Admitting: Hematology

## 2022-09-08 ENCOUNTER — Other Ambulatory Visit: Payer: Self-pay

## 2022-09-08 ENCOUNTER — Other Ambulatory Visit: Payer: Self-pay | Admitting: Hematology

## 2022-09-08 DIAGNOSIS — K5521 Angiodysplasia of colon with hemorrhage: Secondary | ICD-10-CM

## 2022-09-08 DIAGNOSIS — Z79899 Other long term (current) drug therapy: Secondary | ICD-10-CM | POA: Diagnosis not present

## 2022-09-10 ENCOUNTER — Inpatient Hospital Stay: Payer: PPO

## 2022-09-10 ENCOUNTER — Telehealth: Payer: Self-pay

## 2022-09-10 ENCOUNTER — Other Ambulatory Visit: Payer: Self-pay

## 2022-09-10 DIAGNOSIS — D500 Iron deficiency anemia secondary to blood loss (chronic): Secondary | ICD-10-CM

## 2022-09-10 DIAGNOSIS — D649 Anemia, unspecified: Secondary | ICD-10-CM

## 2022-09-10 DIAGNOSIS — C241 Malignant neoplasm of ampulla of Vater: Secondary | ICD-10-CM

## 2022-09-10 DIAGNOSIS — Z5112 Encounter for antineoplastic immunotherapy: Secondary | ICD-10-CM | POA: Diagnosis not present

## 2022-09-10 LAB — CBC WITH DIFFERENTIAL/PLATELET
Abs Immature Granulocytes: 0.02 10*3/uL (ref 0.00–0.07)
Basophils Absolute: 0 10*3/uL (ref 0.0–0.1)
Basophils Relative: 0 %
Eosinophils Absolute: 0 10*3/uL (ref 0.0–0.5)
Eosinophils Relative: 1 %
HCT: 20.8 % — ABNORMAL LOW (ref 39.0–52.0)
Hemoglobin: 6.6 g/dL — CL (ref 13.0–17.0)
Immature Granulocytes: 0 %
Lymphocytes Relative: 17 %
Lymphs Abs: 0.8 10*3/uL (ref 0.7–4.0)
MCH: 32 pg (ref 26.0–34.0)
MCHC: 31.7 g/dL (ref 30.0–36.0)
MCV: 101 fL — ABNORMAL HIGH (ref 80.0–100.0)
Monocytes Absolute: 0.5 10*3/uL (ref 0.1–1.0)
Monocytes Relative: 9 %
Neutro Abs: 3.4 10*3/uL (ref 1.7–7.7)
Neutrophils Relative %: 73 %
Platelets: 137 10*3/uL — ABNORMAL LOW (ref 150–400)
RBC: 2.06 MIL/uL — ABNORMAL LOW (ref 4.22–5.81)
RDW: 25.8 % — ABNORMAL HIGH (ref 11.5–15.5)
WBC: 4.8 10*3/uL (ref 4.0–10.5)
nRBC: 0.4 % — ABNORMAL HIGH (ref 0.0–0.2)

## 2022-09-10 LAB — COMPREHENSIVE METABOLIC PANEL
ALT: 13 U/L (ref 0–44)
AST: 19 U/L (ref 15–41)
Albumin: 2.9 g/dL — ABNORMAL LOW (ref 3.5–5.0)
Alkaline Phosphatase: 80 U/L (ref 38–126)
Anion gap: 6 (ref 5–15)
BUN: 35 mg/dL — ABNORMAL HIGH (ref 8–23)
CO2: 25 mmol/L (ref 22–32)
Calcium: 8.2 mg/dL — ABNORMAL LOW (ref 8.9–10.3)
Chloride: 107 mmol/L (ref 98–111)
Creatinine, Ser: 1.67 mg/dL — ABNORMAL HIGH (ref 0.61–1.24)
GFR, Estimated: 39 mL/min — ABNORMAL LOW (ref >60)
Glucose, Bld: 128 mg/dL — ABNORMAL HIGH (ref 70–99)
Potassium: 3.9 mmol/L (ref 3.5–5.1)
Sodium: 138 mmol/L (ref 135–145)
Total Bilirubin: 0.4 mg/dL (ref 0.3–1.2)
Total Protein: 5.9 g/dL — ABNORMAL LOW (ref 6.5–8.1)

## 2022-09-10 LAB — SAMPLE TO BLOOD BANK

## 2022-09-10 LAB — PREPARE RBC (CROSSMATCH)

## 2022-09-10 LAB — FERRITIN: Ferritin: 701 ng/mL — ABNORMAL HIGH (ref 24–336)

## 2022-09-10 MED ORDER — SODIUM CHLORIDE 0.9% IV SOLUTION
250.0000 mL | Freq: Once | INTRAVENOUS | Status: AC
  Administered 2022-09-10: 250 mL via INTRAVENOUS

## 2022-09-10 NOTE — Patient Instructions (Signed)
Blood Transfusion, Adult A blood transfusion is a procedure in which you receive blood or a type of blood cell (blood component) through an IV. You may need a blood transfusion when you have a low blood count, which is a low number of any blood cell. This may result from a bleeding disorder, illness, injury, or surgery. The blood may come from a donor, or you may be able to have your own blood collected and stored (autologous blood donation) before a planned surgery. The blood given in a transfusion may be made up of different blood components. You may receive: Red blood cells. These carry oxygen to the cells in the body. Platelets. These help your blood to clot. Plasma. This is the liquid part of your blood. It carries proteins and other substances throughout the body. White blood cells. These help you fight infections. If you have hemophilia or another clotting disorder, you may also receive other types of blood products. Depending on the type of blood product, this procedure may take 1-4 hours to complete. Tell a health care provider about: Any bleeding problems you have. Any previous reactions you have had during a blood transfusion. Any allergies you have. All medicines you are taking, including vitamins, herbs, eye drops, creams, and over-the-counter medicines. Any surgeries you have had. Any medical conditions you have. Whether you are pregnant or may be pregnant. What are the risks? Talk with your health care provider about risks. The most common problems include: A mild allergic reaction, such as red, swollen areas of skin (hives) and itching. Fever or chills. This may be the body's response to new blood cells received. This may occur during or up to 4 hours after the transfusion. More serious problems may include: A serious allergic reaction that causes difficulty breathing or swelling around the face and lips. Transfusion-associated circulatory overload (TACO), or too much fluid in  the lungs. This may cause breathing problems. Transfusion-related acute lung injury (TRALI), which causes breathing difficulty and low oxygen in the blood. This can occur within hours of the transfusion or several days later. Iron overload. This can happen after receiving many blood transfusions over a period of time. Infection or virus being transmitted. This is rare because donated blood is carefully tested before it is given. Hemolytic transfusion reaction. This is rare. It happens when the body's defense system (immune system)tries to attack the new blood cells. Symptoms may include fever, chills, nausea, low blood pressure, and low back or chest pain. Transfusion-associated graft-versus-host disease (TAGVHD). This is rare. It happens when donated cells attack the body's healthy tissues. What happens before the procedure? You will have a blood test to check your blood type. This test is done to know what kind of blood your body will accept and to match it to the donor blood. If you are going to have a planned surgery, you may be able to do an autologous blood donation. This may be done in case you need to have a transfusion. You will have your temperature, blood pressure, and pulse checked before the transfusion. If you have had an allergic reaction to a transfusion in the past, you may be given medicine to help prevent a reaction. This medicine may be given to you by mouth (orally) or through an IV. What happens during the procedure?  An IV will be inserted into one of your veins. The bag of blood will be attached to your IV. The blood will then enter through your vein. Your temperature, blood pressure,   and pulse will be monitored during the transfusion. This monitoring is done to detect early signs of a transfusion reaction. Tell your nurse right away if you have any of these symptoms during the transfusion: Shortness of breath or trouble breathing. Chest or back pain. Fever or  chills. Itching or hives. If you have any signs or symptoms of a reaction, your transfusion will be stopped and you may be given medicine. When the transfusion is complete, your IV will be removed. Pressure may be applied to the IV site for a few minutes. A bandage (dressing)will be applied. The procedure may vary among health care providers and hospitals. What happens after the procedure? Your temperature, blood pressure, pulse, breathing rate, and blood oxygen level will be monitored until you leave the hospital or clinic. Your blood may be tested to see how you have responded to the transfusion. You may be warmed with fluids or blankets to maintain a normal body temperature. If you receive your blood transfusion in an outpatient setting, you will be told whom to contact to report any reactions. Where to find more information Visit the [employer]: redcross.org Summary A blood transfusion is a procedure in which you receive blood or a type of blood cell (blood component) through an IV. The blood given in a transfusion may be made up of different blood components. You may receive red blood cells, platelets, plasma, or white blood cells depending on the condition treated. Your temperature, blood pressure, and pulse will be monitored before, during, and after the transfusion. After the transfusion, your blood may be tested to see how your body has responded. This information is not intended to replace advice given to you by your health care provider. Make sure you discuss any questions you have with your health care provider. Document Revised: 04/12/2021 Document Reviewed: 04/12/2021 Elsevier Patient Education  2024 Elsevier Inc.  

## 2022-09-10 NOTE — Telephone Encounter (Signed)
Critical Lab Value reported by [facility] w/[facility] [phone]):  Hbg 6.6  Requested [facility] to fax Dr. Latanya Maudlin office the pt's lab results.  Notified Dr. Mosetta Putt, Santiago Glad, NP, and Vincent Gros, DNP.

## 2022-09-11 ENCOUNTER — Inpatient Hospital Stay: Payer: PPO

## 2022-09-11 ENCOUNTER — Encounter: Payer: Self-pay | Admitting: Hematology

## 2022-09-11 ENCOUNTER — Inpatient Hospital Stay: Payer: PPO | Admitting: Hematology

## 2022-09-11 ENCOUNTER — Other Ambulatory Visit: Payer: Self-pay

## 2022-09-11 VITALS — BP 93/66 | HR 60 | Temp 97.5°F | Resp 17 | Ht 68.0 in | Wt 177.2 lb

## 2022-09-11 VITALS — BP 118/53 | HR 82 | Resp 18

## 2022-09-11 DIAGNOSIS — C241 Malignant neoplasm of ampulla of Vater: Secondary | ICD-10-CM

## 2022-09-11 DIAGNOSIS — D500 Iron deficiency anemia secondary to blood loss (chronic): Secondary | ICD-10-CM

## 2022-09-11 DIAGNOSIS — K5521 Angiodysplasia of colon with hemorrhage: Secondary | ICD-10-CM | POA: Diagnosis not present

## 2022-09-11 DIAGNOSIS — Z5112 Encounter for antineoplastic immunotherapy: Secondary | ICD-10-CM | POA: Diagnosis not present

## 2022-09-11 DIAGNOSIS — D519 Vitamin B12 deficiency anemia, unspecified: Secondary | ICD-10-CM

## 2022-09-11 LAB — CBC WITH DIFFERENTIAL (CANCER CENTER ONLY)
Abs Immature Granulocytes: 0.03 10*3/uL (ref 0.00–0.07)
Basophils Absolute: 0 10*3/uL (ref 0.0–0.1)
Basophils Relative: 0 %
Eosinophils Absolute: 0.1 10*3/uL (ref 0.0–0.5)
Eosinophils Relative: 1 %
HCT: 28.2 % — ABNORMAL LOW (ref 39.0–52.0)
Hemoglobin: 9.2 g/dL — ABNORMAL LOW (ref 13.0–17.0)
Immature Granulocytes: 1 %
Lymphocytes Relative: 18 %
Lymphs Abs: 0.9 10*3/uL (ref 0.7–4.0)
MCH: 31.5 pg (ref 26.0–34.0)
MCHC: 32.6 g/dL (ref 30.0–36.0)
MCV: 96.6 fL (ref 80.0–100.0)
Monocytes Absolute: 0.8 10*3/uL (ref 0.1–1.0)
Monocytes Relative: 14 %
Neutro Abs: 3.5 10*3/uL (ref 1.7–7.7)
Neutrophils Relative %: 66 %
Platelet Count: 145 10*3/uL — ABNORMAL LOW (ref 150–400)
RBC: 2.92 MIL/uL — ABNORMAL LOW (ref 4.22–5.81)
RDW: 23.8 % — ABNORMAL HIGH (ref 11.5–15.5)
WBC Count: 5.3 10*3/uL (ref 4.0–10.5)
nRBC: 0.7 % — ABNORMAL HIGH (ref 0.0–0.2)

## 2022-09-11 LAB — TYPE AND SCREEN
ABO/RH(D): A NEG
Antibody Screen: NEGATIVE
Unit division: 0
Unit division: 0

## 2022-09-11 LAB — BPAM RBC
Blood Product Expiration Date: 202409202359
Blood Product Expiration Date: 202409212359
ISSUE DATE / TIME: 202408141302
ISSUE DATE / TIME: 202408141302
Unit Type and Rh: 600
Unit Type and Rh: 600

## 2022-09-11 LAB — TOTAL PROTEIN, URINE DIPSTICK: Protein, ur: NEGATIVE mg/dL

## 2022-09-11 MED ORDER — SODIUM CHLORIDE 0.9% FLUSH: 10.0000 mL | INTRAVENOUS | Status: DC | PRN

## 2022-09-11 MED ORDER — CYANOCOBALAMIN 1000 MCG/ML IJ SOLN
1000.0000 ug | Freq: Once | INTRAMUSCULAR | Status: AC
  Administered 2022-09-11: 1000 ug via INTRAMUSCULAR
  Filled 2022-09-11: qty 1

## 2022-09-11 MED ORDER — SODIUM CHLORIDE 0.9 % IV SOLN: Freq: Once | INTRAVENOUS | Status: AC

## 2022-09-11 MED ORDER — SODIUM CHLORIDE 0.9 % IV SOLN
5.0000 mg/kg | Freq: Once | INTRAVENOUS | Status: AC
  Administered 2022-09-11: 400 mg via INTRAVENOUS
  Filled 2022-09-11: qty 16

## 2022-09-11 MED ORDER — HEPARIN SOD (PORK) LOCK FLUSH 100 UNIT/ML IV SOLN: 500.0000 [IU] | Freq: Once | INTRAVENOUS | Status: DC | PRN

## 2022-09-11 NOTE — Assessment & Plan Note (Signed)
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  -He has required multiple blood transfusions -We discussed the benefit and side effect of bevacizumab for AVM related GI bleeding, plan to start this week.

## 2022-09-11 NOTE — Progress Notes (Signed)
[facility]   Telephone:(336) [phone] Fax:(336) [phone]   Clinic Follow up Note   Patient Care Team: [name], MD as PCP - General (Internal Medicine) [name], MD as Consulting Physician (General Surgery) [name], MD as Consulting Physician (Gastroenterology) [name], MD as Consulting Physician (Otolaryngology) [name], MD as Consulting Physician (Cardiology) Suzi Roots as Physician Assistant (Dermatology) Malachy Mood, MD as Consulting Physician (Oncology)  Date of Service:  09/11/2022  CHIEF COMPLAINT: f/u of ampullary cancer, acute on chronic anemia, recurrent GIB   CURRENT THERAPY:  Bevacizumab q14d  ASSESSMENT:  Carl Woods is a 89 y.o. male with   Cancer of ampulla of Vater (HCC) diagnosed in 12/2021 -I reviewed PET scan findings, which showed no evidence of distant metastasis  -He was evaluated by pancreatobiliary surgeon Dr. Donell Beers, and felt to be a poor candidate for Whipple surgery. -Patient has decided to proceed with radiation, I discussed the option of concurrent chemotherapy with Xeloda as a radiation sensitizer.  Potential benefit and side effect discussed with patient.  Patient has chronic kidney disease, we will repeat labs today to see if he is a candidate for Xeloda with dose reduction. -Her GFR today is 33, this is borderline for Xeloda.  Given his advanced age, relatively very early stage disease, and the limited benefit of concurrent chemotherapy, I do not recommend the Xeloda or 5-fu.  He will proceed with radiation alone. -I reviewed his molecular testing, MMR was normal, he is not a candidate for immunotherapy.  Foundation One showed no targetable mutations. -He is finishing RT on 04/09/2022 -He is scheduled for next EGD and ERCP in about 2 months. -Due to advanced age, I do not plan to offer him chemotherapy -CT scan in June 2024 showed no evidence of residual disease or new metastasis, although CT  is not ideal to evaluate the residual disease.        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  -He has required multiple blood transfusions -We discussed the benefit and side effect of bevacizumab for AVM related GI bleeding, plan to start this week.    PLAN: -Lab reviewed anemia improved after blood transfusion yesterday -Will proceed for cycle bevacizumab today, and continue every 2 weeks for 3 more cycles -I will send a request for CBC every Monday at his nursing home -Lab and the blood transfusion every Thursday in our office  -Follow-up in 2 weeks   SUMMARY OF ONCOLOGIC HISTORY: Oncology History  Cancer of ampulla of Vater (HCC)  12/06/2021 Imaging   CT ABDOMEN PELVIS W CONTRAST   IMPRESSION: 1. There is moderate intrahepatic bile duct dilatation with marked fusiform dilatation of the common bile duct. No CT visible common bile duct stones identified. Differential considerations include a obstructing distal common bile duct stone (not visible by CT), distal CBD stricture, or mass at the level of the ampullary. Consider further evaluation with contrast enhanced MRI/MRCP or ERCP. 2. Contour the liver appears nodular which may reflect underlying cirrhosis. 3. Age-indeterminate compression fracture involving L1 vertebral body with loss of 50% of the vertebral body height. No signs of retropulsion of fracture fragments into the canal. 4. Fat containing umbilical hernia. 5. 5 mm anterolisthesis of L4 on L5. 6.  Aortic Atherosclerosis (ICD10-I70.0).   12/16/2021 Imaging   MR ABDOMEN  MRCP W WO CONTAST   IMPRESSION: 1. Moderate intrahepatic and extrahepatic bile duct dilation with smooth tapering to the level of the ampulla without filling defect or focal mass lesion identified. Findings may  reflect ampullary stricture or occult ampullary mass. Consider further evaluation with ERCP. 2. Mild dilated main pancreatic duct at the head measuring up to 8 mm. 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 Recommend follow-up pre and post-contrast MRI/MRCP in 2 years. This recommendation follows ACR consensus guidelines: Management of Incidental Pancreatic Cysts: A White Paper of the ACR Incidental Findings Committee. J Am Coll Radiol 2017;14:911-923. 3.  Aortic Atherosclerosis (ICD10-I70.0).   01/23/2022 Procedure   DG ERCP   IMPRESSION: Dilatation of the extrahepatic bile duct with severe tapering or narrowing in the distal common bile duct. Placement of biliary stent.   These images were submitted for radiologic interpretation only. Please see the procedural report for the amount of contrast.    01/23/2022 Procedure   EUS/ERCP  ENDOSONOGRAPHIC FINDING: There was dilation in the common bile duct (12.3 mm -> 20.4 mm) and in the common hepatic duct (23.0 mm). A small amount of hyperechoic material consistent with sludge was visualized endosonographically in the common bile duct. Moderate hyperechoic material consistent with sludge was visualized endosonographically in the gallbladder with normal gallbladder wall thickness. Pancreatic parenchymal abnormalities were noted in the entire pancreas. These consisted of hyperechoic foci with shadowing and cysts. There was a 9.1 mm by 6.1 mm cyst in the neck of the pancreas. There was a 15.3 mm by 14.1 mm cyst in the tail of the pancreas. The pancreatic duct had a dilated endosonographic appearance, had a prominently branched endosonographic appearance and had hyperechoic walls in the pancreatic head (PD - 8.2 mm -> 4.3 mm), genu of the pancreas (3.5 mm), body of the pancreas (3.2 mm) and tail of the pancreas (2.8 mm). A hypoechoic irregular  lesion was identified endosonographically at the ampulla. The lesion measured 15 mm by 13 mm in maximal cross-sectional diameter. The lesion extended from the mucosa to the submucosa. The outer margins were irregular. This is noted where CBD and PD dilation is noted to occur. Endosonographic imaging in the visualized portion of the liver showed no mass.  No malignant-appearing lymph nodes were visualized in the celiac region (level 20), peripancreatic region and porta hepatis region. The celiac region was visualized.   01/23/2022 Pathology Results   SURGICAL PATHOLOGY  CASE: [id number]  PATIENT: Charlesetta Ivory  Surgical Pathology Report   FINAL MICROSCOPIC DIAGNOSIS:   A. STOMACH, RANDOM, BIOPSY:  - Gastric mucosa, no significant abnormality.  No inflammation,  intestinal metaplasia, dysplasia or malignancy.   B. AMPULLARY LESION, BIOPSY:  - Adenocarcinoma.  See comment.    01/31/2022 Initial Diagnosis   Cancer of ampulla of Vater (HCC)   02/07/2022 Imaging    IMPRESSION: Interval increase in size of lymph node in the anterior cardiophrenic space on the right.   Increase small right pleural effusion compared to the previous MRI.   Subtle nodular tissue along the margin of the right hepatic lobe in the upper abdomen posteriorly. Recommend additional workup when appropriate.   Calcified pleural plaques.   Evidence of chronic liver disease.   Aortic Atherosclerosis (ICD10-I70.0).   02/14/2022 Miscellaneous   Foundation One:  Biomarker Findings Microsatellite status-Cannot be determined Tumor Mutation Burden-Cannot be determined  Genomics Findings EPHB4 amplificatio SF3B1 K666T TP53 R273H  INTERVAL HISTORY:  REGINALD MCCALEB is here for a follow up of ampullary cancer, acute on chronic anemia, recurrent GIB. He was last seen by  NP Lacie on 09/04/2022. He presents to the clinic with his daughter.  He was found to have hemoglobin 6.6 in the nursing home a few days  ago, and came in yesterday for 2 unit of blood transfusion.  He feels better.  He has bowel movement every a few days, last 1 was 2 days ago and was black.  No other new complaints.    All other systems were reviewed with the patient and are negative.  MEDICAL HISTORY:  Past Medical History:  Diagnosis Date   Anal fistula    Arthritis    Fingers and hands   Atrial fibrillation (HCC)    AVM (arteriovenous malformation)    Clipped during Colonoscopy 05/2016   Barrett's esophagus    Bilateral cataracts    BPH (benign prostatic hyperplasia)    Cecal angiodysplasia 05/28/2016   ablated at colonoscopy   Deviated nasal septum    Diverticulosis of sigmoid colon    E. 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   Left Forehead (treatment after biopsy)   OSA on CPAP    Perianal rash    Recurrent epistaxis    Renal cyst 01/04/2013   Small left peripelvic renal cysts , noted on US Renal   SCCA (squamous cell carcinoma) of skin 10/17/2015   Left Sup Bridge of Nose (curet, cautery and 5FU)   Seasonal allergies    Superficial basal cell carcinoma (BCC) 10/17/2015   Left Bulb of Nose (curet, cautery and 5FU)   Tubular adenoma     SURGICAL HISTORY: Past Surgical History:  Procedure Laterality Date   BILIARY STENT PLACEMENT N/A 01/23/2022   Procedure: BILIARY STENT PLACEMENT;  Surgeon: Lemar Lofty., MD;  Location: [facility];  Service: Gastroenterology;  Laterality: N/A;   BILIARY STENT PLACEMENT N/A 08/06/2022   Procedure: BILIARY STENT PLACEMENT;  Surgeon: Lynann Bologna, MD;  Location: [facility];  Service: Gastroenterology;  Laterality: N/A;   BIOPSY  01/23/2022   Procedure: BIOPSY;  Surgeon: Meridee Score Netty Starring., MD;  Location: [facility];  Service: Gastroenterology;;   BIOPSY  07/09/2022   Procedure: BIOPSY;   Surgeon: Shellia Cleverly, DO;  Location: [facility];  Service: Gastroenterology;;   BIOPSY  08/02/2022   Procedure: BIOPSY;  Surgeon: Sherrilyn Rist, MD;  Location: [facility];  Service: Gastroenterology;;   COLONOSCOPY  multiple   CYSTOSCOPY     ENDOSCOPIC RETROGRADE CHOLANGIOPANCREATOGRAPHY (ERCP) WITH PROPOFOL N/A 01/23/2022   Procedure: ENDOSCOPIC RETROGRADE CHOLANGIOPANCREATOGRAPHY (ERCP) WITH PROPOFOL;  Surgeon: Lemar Lofty., MD;  Location: [facility];  Service: Gastroenterology;  Laterality: N/A;   ENTEROSCOPY N/A 08/02/2022   Procedure: ENTEROSCOPY;  Surgeon: Sherrilyn Rist, MD;  Location: [facility];  Service: Gastroenterology;  Laterality: N/A;   ERCP N/A 08/06/2022   Procedure: ENDOSCOPIC RETROGRADE CHOLANGIOPANCREATOGRAPHY (ERCP);  Surgeon: Lynann Bologna, MD;  Location: [facility];  Service: Gastroenterology;  Laterality: N/A;   ESOPHAGOGASTRODUODENOSCOPY  multiple   ESOPHAGOGASTRODUODENOSCOPY N/A 01/23/2022   Procedure: ESOPHAGOGASTRODUODENOSCOPY (EGD);  Surgeon: Lemar Lofty., MD;  Location: [facility];  Service: Gastroenterology;  Laterality: N/A;   ESOPHAGOGASTRODUODENOSCOPY N/A 07/09/2022   Procedure: ESOPHAGOGASTRODUODENOSCOPY (EGD);  Surgeon: Doristine Locks  V, DO;  Location: [facility];  Service: Gastroenterology;  Laterality: N/A;   ESOPHAGOGASTRODUODENOSCOPY N/A 08/21/2022   Procedure: ESOPHAGOGASTRODUODENOSCOPY (EGD);  Surgeon: Jenel Lucks, MD;  Location[facility];  Service: Gastroenterology;  Laterality: N/A;   EUS N/A 01/23/2022   Procedure: UPPER ENDOSCOPIC ULTRASOUND (EUS) RADIAL;  Surgeon: Lemar Lofty., MD;  Location: [facility];  Service: Gastroenterology;  Laterality: N/A;   EVALUATION UNDER ANESTHESIA WITH FISTULECTOMY N/A 03/02/2018   Procedure: ANORECTAL EXAM UNDER ANESTHESIA WITH REPAIR OF SUPERFICIAL PERIRECTAL FISTULA AND HEMORRHOIDECTOMY;  Surgeon: [name], MD;  Location: [facility];  Service:  General;  Laterality: N/A;   EXPLORATORY LAPAROTOMY     HEMOSTASIS CLIP PLACEMENT  07/09/2022   Procedure: HEMOSTASIS CLIP PLACEMENT;  Surgeon: Shellia Cleverly, DO;  Location: [facility];  Service: Gastroenterology;;   HOT HEMOSTASIS N/A 08/02/2022   Procedure: HOT HEMOSTASIS (ARGON PLASMA COAGULATION/BICAP);  Surgeon: Sherrilyn Rist, MD;  Location: [facility];  Service: Gastroenterology;  Laterality: N/A;   HOT HEMOSTASIS N/A 08/21/2022   Procedure: HOT HEMOSTASIS (ARGON PLASMA COAGULATION/BICAP);  Surgeon: Jenel Lucks, MD;  Location: [facility];  Service: Gastroenterology;  Laterality: N/A;   IR THORACENTESIS ASP PLEURAL SPACE W/IMG GUIDE  07/10/2022   REMOVAL OF STONES  01/23/2022   Procedure: REMOVAL OF STONES;  Surgeon: Meridee Score Netty Starring., MD;  Location: [facility];  Service: Gastroenterology;;   REMOVAL OF STONES  08/06/2022   Procedure: REMOVAL OF STONES;  Surgeon: Lynann Bologna, MD;  Location: [facility];  Service: Gastroenterology;;   Dennison Mascot  01/23/2022   Procedure: Dennison Mascot;  Surgeon: Mansouraty, Netty Starring., MD;  Location: [facility];  Service: Gastroenterology;;    I have reviewed the social history and family history with the patient and they are unchanged from previous note.  ALLERGIES:  is allergic to nsaids.  MEDICATIONS:  Current Outpatient Medications  Medication Sig Dispense Refill   bumetanide (BUMEX) 1 MG tablet Take 1 tablet (1 mg total) by mouth daily.     Cholecalciferol (VITAMIN D3) 5000 units CAPS Take 5,000 Units by mouth every morning.     dapagliflozin propanediol (FARXIGA) 10 MG TABS tablet Take 10 mg by mouth in the morning.     ferrous sulfate 325 (65 FE) MG tablet Take 325 mg by mouth every Monday, Wednesday, and Friday.     Glucosamine Sulfate 500 MG TABS Take 500 mg by mouth daily.     levothyroxine (SYNTHROID) 150 MCG tablet Take 150 mcg by mouth daily before breakfast.     nitroGLYCERIN (NITROSTAT) 0.4 MG SL  tablet Place 1 tablet (0.4 mg total) under the tongue every 5 (five) minutes as needed for up to 25 days for chest pain. 25 tablet 3   olopatadine (PATANOL) 0.1 % ophthalmic solution Place 1 drop into both eyes daily as needed for allergies.     pantoprazole (PROTONIX) 40 MG tablet Take 1 tablet (40 mg total) by mouth 2 (two) times daily.     potassium chloride SA (KLOR-CON M) 20 MEQ tablet Take 1 tablet (20 mEq total) by mouth 2 (two) times daily. 60 tablet 0   sodium chloride (OCEAN) 0.65 % SOLN nasal spray Place 1 spray into both nostrils as needed for congestion.     sucralfate (CARAFATE) 1 GM/10ML suspension Take 10 mLs (1 g total) by mouth 4 (four) times daily -  with meals and at bedtime. 420 mL 0   No current facility-administered medications for this visit.   Facility-Administered Medications Ordered in Other  Visits  Medication Dose Route Frequency Provider Last Rate Last Admin   heparin lock flush 100 unit/mL  500 Units Intracatheter Once PRN Malachy Mood, MD       sodium chloride flush (NS) 0.9 % injection 10 mL  10 mL Intracatheter PRN Malachy Mood, MD        PHYSICAL EXAMINATION: ECOG PERFORMANCE STATUS: 3 - Symptomatic, >50% confined to bed  Vitals:   09/11/22 1152  BP: 93/66  Pulse: 60  Resp: 17  Temp: (!) 97.5 F (36.4 C)  SpO2: 100%   Wt Readings from Last 3 Encounters:  09/11/22 177 lb 3.2 oz (80.4 kg)  09/04/22 199 lb (90.3 kg)  08/29/22 199 lb 11.8 oz (90.6 kg)     GENERAL:alert, no distress and comfortable SKIN: skin color, texture, turgor are normal, no rashes or significant lesions EYES: normal, Conjunctiva are pink and non-injected, sclera clear NECK: supple, thyroid normal size, non-tender, without nodularity LYMPH:  no palpable lymphadenopathy in the cervical, axillary  LUNGS: clear to auscultation and percussion with normal breathing effort HEART: regular rate & rhythm and no murmurs and no lower extremity edema ABDOMEN:abdomen soft, non-tender and normal  bowel sounds Musculoskeletal:no cyanosis of digits and no clubbing  NEURO: alert & oriented x 3 with fluent speech, no focal motor/sensory deficits  LABORATORY DATA:  I have reviewed the data as listed    Latest Ref Rng & Units 09/11/2022   12:11 PM 09/10/2022   11:15 AM 09/04/2022   10:27 AM  CBC  WBC 4.0 - 10.5 K/uL 5.3  4.8  4.8   Hemoglobin 13.0 - 17.0 g/dL 9.2  6.6  7.2   Hematocrit 39.0 - 52.0 % 28.2  20.8  22.0   Platelets 150 - 400 K/uL 145  137  177         Latest Ref Rng & Units 09/10/2022   11:15 AM 09/04/2022   10:27 AM 08/29/2022    1:23 AM  CMP  Glucose 70 - 99 mg/dL 409  811  96   BUN 8 - 23 mg/dL 35  34  21   Creatinine 0.61 - 1.24 mg/dL 9.14  7.82  9.56   Sodium 135 - 145 mmol/L 138  141  135   Potassium 3.5 - 5.1 mmol/L 3.9  4.0  4.0   Chloride 98 - 111 mmol/L 107  109  110   CO2 22 - 32 mmol/L 25  25  20    Calcium 8.9 - 10.3 mg/dL 8.2  8.7  8.3   Total Protein 6.5 - 8.1 g/dL 5.9  5.9    Total Bilirubin 0.3 - 1.2 mg/dL 0.4  0.4    Alkaline Phos 38 - 126 U/L 80  87    AST 15 - 41 U/L 19  20    ALT 0 - 44 U/L 13  16        RADIOGRAPHIC STUDIES: I have personally reviewed the radiological images as listed and agreed with the findings in the report. No results found.    No orders of the defined types were placed in this encounter.  All questions were answered. The patient knows to call the clinic with any problems, questions or concerns. No barriers to learning was detected. The total time spent in the appointment was 25 minutes.     Malachy Mood, MD 09/11/2022   Carolin Coy, CMA, am acting as scribe for Malachy Mood, MD.   I have reviewed the above documentation for accuracy and  completeness, and I agree with the above.

## 2022-09-11 NOTE — Assessment & Plan Note (Signed)
diagnosed in 12/2021 -I reviewed PET scan findings, which showed no evidence of distant metastasis  -He was evaluated by pancreatobiliary surgeon Dr. Donell Beers, and felt to be a poor candidate for Whipple surgery. -Patient has decided to proceed with radiation, I discussed the option of concurrent chemotherapy with Xeloda as a radiation sensitizer.  Potential benefit and side effect discussed with patient.  Patient has chronic kidney disease, we will repeat labs today to see if he is a candidate for Xeloda with dose reduction. -Her GFR today is 33, this is borderline for Xeloda.  Given his advanced age, relatively very early stage disease, and the limited benefit of concurrent chemotherapy, I do not recommend the Xeloda or 5-fu.  He will proceed with radiation alone. -I reviewed his molecular testing, MMR was normal, he is not a candidate for immunotherapy.  Foundation One showed no targetable mutations. -He is finishing RT on 04/09/2022 -He is scheduled for next EGD and ERCP in about 2 months. -Due to advanced age, I do not plan to offer him chemotherapy -CT scan in June 2024 showed no evidence of residual disease or new metastasis, although CT is not ideal to evaluate the residual disease.

## 2022-09-11 NOTE — Patient Instructions (Addendum)
[facility]  Discharge Instructions: Thank you for choosing [facility] to provide your oncology and hematology care.   If you have a lab appointment with the Cancer Center, please go directly to the Cancer Center and check in at the registration area.   Wear comfortable clothing and clothing appropriate for easy access to any Portacath or PICC line.   We strive to give you quality time with your provider. You may need to reschedule your appointment if you arrive late (15 or more minutes).  Arriving late affects you and other patients whose appointments are after yours.  Also, if you miss three or more appointments without notifying the office, you may be dismissed from the clinic at the provider's discretion.      For prescription refill requests, have your pharmacy contact our office and allow 72 hours for refills to be completed.    Today you received the following chemotherapy and/or immunotherapy agents: Bevacizumab      To help prevent nausea and vomiting after your treatment, we encourage you to take your nausea medication as directed.  BELOW ARE SYMPTOMS THAT SHOULD BE REPORTED IMMEDIATELY: *FEVER GREATER THAN 100.4 F (38 C) OR HIGHER *CHILLS OR SWEATING *NAUSEA AND VOMITING THAT IS NOT CONTROLLED WITH YOUR NAUSEA MEDICATION *UNUSUAL SHORTNESS OF BREATH *UNUSUAL BRUISING OR BLEEDING *URINARY PROBLEMS (pain or burning when urinating, or frequent urination) *BOWEL PROBLEMS (unusual diarrhea, constipation, pain near the anus) TENDERNESS IN MOUTH AND THROAT WITH OR WITHOUT PRESENCE OF ULCERS (sore throat, sores in mouth, or a toothache) UNUSUAL RASH, SWELLING OR PAIN  UNUSUAL VAGINAL DISCHARGE OR ITCHING   Items with * indicate a potential emergency and should be followed up as soon as possible or go to the Emergency Department if any problems should occur.  Please show the CHEMOTHERAPY ALERT CARD or IMMUNOTHERAPY ALERT CARD at  check-in to the Emergency Department and triage nurse.  Should you have questions after your visit or need to cancel or reschedule your appointment, please contact [facility]  Dept: [phone]  and follow the prompts.  Office hours are 8:00 a.m. to 4:30 p.m. Monday - Friday. Please note that voicemails left after 4:00 p.m. may not be returned until the following business day.  We are closed weekends and major holidays. You have access to a nurse at all times for urgent questions. Please call the main number to the clinic Dept: [phone] and follow the prompts.   For any non-urgent questions, you may also contact your provider using MyChart. We now offer e-Visits for anyone 41 and older to request care online for non-urgent symptoms. For details visit [email or web address].   Also download the MyChart app! Go to the app store, search "MyChart", open the app, select [facility], and log in with your MyChart username and password.  Bevacizumab Injection What is this medication? BEVACIZUMAB (be va SIZ yoo mab) treats some types of cancer. It works by blocking a protein that causes cancer cells to grow and multiply. This helps to slow or stop the spread of cancer cells. It is a monoclonal antibody. This medicine may be used for other purposes; ask your health care provider or pharmacist if you have questions. COMMON BRAND NAME(S): Alymsys, Avastin, MVASI, Omer Jack What should I tell my care team before I take this medication? They need to know if you have any of these conditions: Blood clots Coughing up blood Having or recent surgery Heart  failure High blood pressure History of a connection between 2 or more body parts that do not usually connect (fistula) History of a tear in your stomach or intestines Protein in your urine An unusual or allergic reaction to bevacizumab, other medications, foods, dyes, or preservatives Pregnant or trying to get  pregnant Breast-feeding How should I use this medication? This medication is injected into a vein. It is given by your care team in a hospital or clinic setting. Talk to your care team the use of this medication in children. Special care may be needed. Overdosage: If you think you have taken too much of this medicine contact a poison control center or emergency room at once. NOTE: This medicine is only for you. Do not share this medicine with others. What if I miss a dose? Keep appointments for follow-up doses. It is important not to miss your dose. Call your care team if you are unable to keep an appointment. What may interact with this medication? Interactions are not expected. This list may not describe all possible interactions. Give your health care provider a list of all the medicines, herbs, non-prescription drugs, or dietary supplements you use. Also tell them if you smoke, drink alcohol, or use illegal drugs. Some items may interact with your medicine. What should I watch for while using this medication? Your condition will be monitored carefully while you are receiving this medication. You may need blood work while taking this medication. This medication may make you feel generally unwell. This is not uncommon as chemotherapy can affect healthy cells as well as cancer cells. Report any side effects. Continue your course of treatment even though you feel ill unless your care team tells you to stop. This medication may increase your risk to bruise or bleed. Call your care team if you notice any unusual bleeding. Before having surgery, talk to your care team to make sure it is ok. This medication can increase the risk of poor healing of your surgical site or wound. You will need to stop this medication for 28 days before surgery. After surgery, wait at least 28 days before restarting this medication. Make sure the surgical site or wound is healed enough before restarting this medication. Talk  to your care team if questions. Talk to your care team if you may be pregnant. Serious birth defects can occur if you take this medication during pregnancy and for 6 months after the last dose. Contraception is recommended while taking this medication and for 6 months after the last dose. Your care team can help you find the option that works for you. Do not breastfeed while taking this medication and for 6 months after the last dose. This medication can cause infertility. Talk to your care team if you are concerned about your fertility. What side effects may I notice from receiving this medication? Side effects that you should report to your care team as soon as possible: Allergic reactions--skin rash, itching, hives, swelling of the face, lips, tongue, or throat Bleeding--bloody or black, tar-like stools, vomiting blood or brown material that looks like coffee grounds, red or dark brown urine, small red or purple spots on skin, unusual bruising or bleeding Blood clot--pain, swelling, or warmth in the leg, shortness of breath, chest pain Heart attack--pain or tightness in the chest, shoulders, arms, or jaw, nausea, shortness of breath, cold or clammy skin, feeling faint or lightheaded Heart failure--shortness of breath, swelling of the ankles, feet, or hands, sudden weight gain, unusual weakness or  fatigue Increase in blood pressure Infection--fever, chills, cough, sore throat, wounds that don't heal, pain or trouble when passing urine, general feeling of discomfort or being unwell Infusion reactions--chest pain, shortness of breath or trouble breathing, feeling faint or lightheaded Kidney injury--decrease in the amount of urine, swelling of the ankles, hands, or feet Stomach pain that is severe, does not go away, or gets worse Stroke--sudden numbness or weakness of the face, arm, or leg, trouble speaking, confusion, trouble walking, loss of balance or coordination, dizziness, severe headache,  change in vision Sudden and severe headache, confusion, change in vision, seizures, which may be signs of posterior reversible encephalopathy syndrome (PRES) Side effects that usually do not require medical attention (report to your care team if they continue or are bothersome): Back pain Change in taste Diarrhea Dry skin Increased tears Nosebleed This list may not describe all possible side effects. Call your doctor for medical advice about side effects. You may report side effects to FDA at 1-800-FDA-1088. Where should I keep my medication? This medication is given in a hospital or clinic. It will not be stored at home. NOTE: This sheet is a summary. It may not cover all possible information. If you have questions about this medicine, talk to your doctor, pharmacist, or health care provider.  2024 Elsevier/Gold Standard (2021-05-31 00:00:00)

## 2022-09-12 ENCOUNTER — Encounter (HOSPITAL_COMMUNITY): Payer: Self-pay | Admitting: Emergency Medicine

## 2022-09-12 ENCOUNTER — Other Ambulatory Visit: Payer: Self-pay

## 2022-09-12 ENCOUNTER — Emergency Department (HOSPITAL_COMMUNITY)
Admission: EM | Admit: 2022-09-12 | Discharge: 2022-09-12 | Disposition: A | Discharge status: Skilled Nursing Facility | Payer: PPO | Source: Home / Self Care | Attending: Emergency Medicine | Admitting: Emergency Medicine

## 2022-09-12 ENCOUNTER — Encounter: Payer: Self-pay | Admitting: Internal Medicine

## 2022-09-12 DIAGNOSIS — Z7401 Bed confinement status: Secondary | ICD-10-CM | POA: Diagnosis not present

## 2022-09-12 DIAGNOSIS — R404 Transient alteration of awareness: Secondary | ICD-10-CM | POA: Diagnosis not present

## 2022-09-12 DIAGNOSIS — R531 Weakness: Secondary | ICD-10-CM | POA: Insufficient documentation

## 2022-09-12 DIAGNOSIS — Z7901 Long term (current) use of anticoagulants: Secondary | ICD-10-CM | POA: Diagnosis not present

## 2022-09-12 LAB — CBC WITH DIFFERENTIAL/PLATELET
Abs Immature Granulocytes: 0.03 10*3/uL (ref 0.00–0.07)
Basophils Absolute: 0 10*3/uL (ref 0.0–0.1)
Basophils Relative: 1 %
Eosinophils Absolute: 0.1 10*3/uL (ref 0.0–0.5)
Eosinophils Relative: 2 %
HCT: 29.2 % — ABNORMAL LOW (ref 39.0–52.0)
Hemoglobin: 9.2 g/dL — ABNORMAL LOW (ref 13.0–17.0)
Immature Granulocytes: 1 %
Lymphocytes Relative: 20 %
Lymphs Abs: 1 10*3/uL (ref 0.7–4.0)
MCH: 31.5 pg (ref 26.0–34.0)
MCHC: 31.5 g/dL (ref 30.0–36.0)
MCV: 100 fL (ref 80.0–100.0)
Monocytes Absolute: 0.7 10*3/uL (ref 0.1–1.0)
Monocytes Relative: 14 %
Neutro Abs: 3.1 10*3/uL (ref 1.7–7.7)
Neutrophils Relative %: 62 %
Platelets: 177 10*3/uL (ref 150–400)
RBC: 2.92 MIL/uL — ABNORMAL LOW (ref 4.22–5.81)
RDW: 24.2 % — ABNORMAL HIGH (ref 11.5–15.5)
WBC: 5 10*3/uL (ref 4.0–10.5)
nRBC: 0 % (ref 0.0–0.2)

## 2022-09-12 LAB — COMPREHENSIVE METABOLIC PANEL
ALT: 19 U/L (ref 0–44)
AST: 34 U/L (ref 15–41)
Albumin: 2.8 g/dL — ABNORMAL LOW (ref 3.5–5.0)
Alkaline Phosphatase: 76 U/L (ref 38–126)
Anion gap: 8 (ref 5–15)
BUN: 37 mg/dL — ABNORMAL HIGH (ref 8–23)
CO2: 21 mmol/L — ABNORMAL LOW (ref 22–32)
Calcium: 8.5 mg/dL — ABNORMAL LOW (ref 8.9–10.3)
Chloride: 110 mmol/L (ref 98–111)
Creatinine, Ser: 1.76 mg/dL — ABNORMAL HIGH (ref 0.61–1.24)
GFR, Estimated: 37 mL/min — ABNORMAL LOW (ref >60)
Glucose, Bld: 109 mg/dL — ABNORMAL HIGH (ref 70–99)
Potassium: 4.1 mmol/L (ref 3.5–5.1)
Sodium: 139 mmol/L (ref 135–145)
Total Bilirubin: 0.8 mg/dL (ref 0.3–1.2)
Total Protein: 6 g/dL — ABNORMAL LOW (ref 6.5–8.1)

## 2022-09-12 LAB — PROTIME-INR
INR: 1 (ref 0.8–1.2)
Prothrombin Time: 13.5 seconds (ref 11.4–15.2)

## 2022-09-12 LAB — TYPE AND SCREEN
ABO/RH(D): A NEG
Antibody Screen: NEGATIVE

## 2022-09-12 NOTE — Discharge Instructions (Signed)
As discussed, your evaluation today has been largely reassuring.  But, it is important that you monitor your condition carefully, and do not hesitate to return to the ED if you develop new, or concerning changes in your condition. ? ?Otherwise, please follow-up with your physician for appropriate ongoing care. ? ?

## 2022-09-12 NOTE — ED Triage Notes (Signed)
Pt here from [facility] nursing home with c/o gen weakness pt normally able to feed himself and get around but according to staff pt was unable to do that this am , received an iron infusion and blood recently

## 2022-09-12 NOTE — ED Notes (Signed)
Report called to Nursing home and pt placed on ptar list for transport

## 2022-09-12 NOTE — Progress Notes (Signed)
Tresa Endo the nurse manage from [facility] nursing home called stating that patient is very weak this morning he is unable to move around or even feed himself. His vitals are all within normal range just very weak.  She wanted to know if this is normal after a treatment. Spoke with nurse Olegario Shearer RN she stated that he should not be this week and that they should have him evaluated in the ER. Tresa Endo stated she would have him transported to the ER.

## 2022-09-12 NOTE — ED Provider Notes (Signed)
[place] [facility] AT [facility] Provider Note   CSN: [id number] Arrival date & time: 09/12/22  1139     History  Chief Complaint  Patient presents with   Weakness    [facility] is a 89 y.o. male.  HPI : Multiple medical including A-fib anticoagulated presents from nursing facility with concern for weakness per staff.  Patient actually recollects that staff member stated that he was weaker than usual, not acting in a typical fashion and he was sent here for evaluation.  He notes that he is undergoing evaluation for melena which has been present for weeks, without obvious source. He has required transfusions in the past. No abdominal pain, no pain.  He does acknowledge weakness throughout without focality.    Home Medications Prior to Admission medications   Medication Sig Start Date End Date Taking? Authorizing Provider  bumetanide (BUMEX) 1 MG tablet Take 1 tablet (1 mg total) by mouth daily. 07/11/22   Burnadette Pop, MD  Cholecalciferol (VITAMIN D3) 5000 units CAPS Take 5,000 Units by mouth every morning.    [provider]  dapagliflozin propanediol (FARXIGA) 10 MG TABS tablet Take 10 mg by mouth in the morning. 05/29/20   [provider]  ferrous sulfate 325 (65 FE) MG tablet Take 325 mg by mouth every Monday, Wednesday, and Friday.    [provider]  Glucosamine Sulfate 500 MG TABS Take 500 mg by mouth daily. 07/08/16   [provider]  levothyroxine (SYNTHROID) 150 MCG tablet Take 150 mcg by mouth daily before breakfast. 02/11/16   [provider]  nitroGLYCERIN (NITROSTAT) 0.4 MG SL tablet Place 1 tablet (0.4 mg total) under the tongue every 5 (five) minutes as needed for up to 25 days for chest pain. 01/31/20 01/18/24  [name], MD  olopatadine (PATANOL) 0.1 % ophthalmic solution Place 1 drop into both eyes daily as needed for allergies. 07/08/16   [provider]  pantoprazole (PROTONIX) 40 MG  tablet Take 1 tablet (40 mg total) by mouth 2 (two) times daily. 07/11/22   Burnadette Pop, MD  potassium chloride SA (KLOR-CON M) 20 MEQ tablet Take 1 tablet (20 mEq total) by mouth 2 (two) times daily. 07/30/22   Malachy Mood, MD  sodium chloride (OCEAN) 0.65 % SOLN nasal spray Place 1 spray into both nostrils as needed for congestion.    [provider]  sucralfate (CARAFATE) 1 GM/10ML suspension Take 10 mLs (1 g total) by mouth 4 (four) times daily -  with meals and at bedtime. 07/11/22   Burnadette Pop, MD      Allergies    Nsaids    Review of Systems   Review of Systems  All other systems reviewed and are negative.   Physical Exam Updated Vital Signs BP (!) 102/54   Pulse 61   Temp 97.7 F (36.5 C) (Oral)   Resp 15   SpO2 100%  Physical Exam Vitals and nursing note reviewed.  Constitutional:      General: He is not in acute distress.    Appearance: He is well-developed. He is ill-appearing. He is not toxic-appearing or diaphoretic.  HENT:     Head: Normocephalic and atraumatic.  Eyes:     Conjunctiva/sclera: Conjunctivae normal.  Cardiovascular:     Rate and Rhythm: Rhythm irregular.  Pulmonary:     Effort: Pulmonary effort is normal. No respiratory distress.     Breath sounds: No stridor.  Abdominal:     General:  There is no distension.     Tenderness: There is no abdominal tenderness. There is no guarding.  Skin:    General: Skin is warm and dry.     Coloration: Skin is pale.  Neurological:     Mental Status: He is alert and oriented to person, place, and time.     ED Results / Procedures / Treatments   Labs (all labs ordered are listed, but only abnormal results are displayed) Labs Reviewed  COMPREHENSIVE METABOLIC PANEL - Abnormal; Notable for the following components:      Result Value   CO2 21 (*)    Glucose, Bld 109 (*)    BUN 37 (*)    Creatinine, Ser 1.76 (*)    Calcium 8.5 (*)    Total Protein 6.0 (*)    Albumin 2.8 (*)    GFR, Estimated  37 (*)    All other components within normal limits  CBC WITH DIFFERENTIAL/PLATELET - Abnormal; Notable for the following components:   RBC 2.92 (*)    Hemoglobin 9.2 (*)    HCT 29.2 (*)    RDW 24.2 (*)    All other components within normal limits  PROTIME-INR  TYPE AND SCREEN    EKG EKG Interpretation Date/Time:  Friday September 12 2022 11:51:16 EDT Ventricular Rate:  59 PR Interval:    QRS Duration:  86 QT Interval:  468 QTC Calculation: 464 R Axis:   71  Text Interpretation: Atrial fibrillation Ventricular premature complex Low voltage, extremity and precordial leads Borderline repolarization abnormality Artifact Abnormal ECG Confirmed by Gerhard Munch [phone]) on 09/12/2022 12:59:01 PM  Radiology No results found.  Procedures Procedures    Medications Ordered in ED Medications - No data to display  ED Course/ Medical Decision Making/ A&P                                 Medical Decision Making Elderly male with A-fib, anticoagulated, multiple other medical problems, prior admissions for encephalopathy presents with weakness.  Staff concern for weakness, and physical exam concerning for symptomatic anemia versus metabolic state.  Patient's initial vital signs are reassuring, acknowledging chronic A-fib.  Labs sent type and screen sent, monitoring started. Cardiac 60s A-fib abnormal Pulse ox 99% room air normal   Amount and/or Complexity of Data Reviewed External Data Reviewed: notes.    Details: Nursing home transfer note reviewed Labs: ordered. Decision-making details documented in ED Course.  Risk Decision regarding hospitalization. Diagnosis or treatment significantly limited by social determinants of health.   1:56 PM Patient is hemodynamically unremarkable, consistent with arrival vital signs.  He has been no distress, has had no decompensation, labs reviewed, consistent with multiple prior studies including mild renal dysfunction, hemoglobin value  increased from prior studies, 9.2.  Absent other focal complaints and reported the patient, without evidence for acute changes, suspicion for chronic pathology, patient will return to his nursing facility.        Final Clinical Impression(s) / ED Diagnoses Final diagnoses:  Weakness    Rx / DC Orders ED Discharge Orders     None         Gerhard Munch, MD 09/12/22 1356

## 2022-09-13 DIAGNOSIS — E039 Hypothyroidism, unspecified: Secondary | ICD-10-CM | POA: Diagnosis not present

## 2022-09-13 DIAGNOSIS — E46 Unspecified protein-calorie malnutrition: Secondary | ICD-10-CM | POA: Diagnosis not present

## 2022-09-13 DIAGNOSIS — D649 Anemia, unspecified: Secondary | ICD-10-CM | POA: Diagnosis not present

## 2022-09-13 DIAGNOSIS — K2980 Duodenitis without bleeding: Secondary | ICD-10-CM | POA: Diagnosis not present

## 2022-09-15 DIAGNOSIS — D649 Anemia, unspecified: Secondary | ICD-10-CM | POA: Diagnosis not present

## 2022-09-15 DIAGNOSIS — Z79899 Other long term (current) drug therapy: Secondary | ICD-10-CM | POA: Diagnosis not present

## 2022-09-18 ENCOUNTER — Telehealth: Payer: Self-pay | Admitting: *Deleted

## 2022-09-18 ENCOUNTER — Inpatient Hospital Stay: Payer: PPO

## 2022-09-18 DIAGNOSIS — Z5112 Encounter for antineoplastic immunotherapy: Secondary | ICD-10-CM | POA: Diagnosis not present

## 2022-09-18 DIAGNOSIS — D649 Anemia, unspecified: Secondary | ICD-10-CM

## 2022-09-18 DIAGNOSIS — C241 Malignant neoplasm of ampulla of Vater: Secondary | ICD-10-CM

## 2022-09-18 LAB — CBC WITH DIFFERENTIAL/PLATELET
Abs Immature Granulocytes: 0.02 10*3/uL (ref 0.00–0.07)
Basophils Absolute: 0 10*3/uL (ref 0.0–0.1)
Basophils Relative: 1 %
Eosinophils Absolute: 0.2 10*3/uL (ref 0.0–0.5)
Eosinophils Relative: 3 %
HCT: 30.8 % — ABNORMAL LOW (ref 39.0–52.0)
Hemoglobin: 9.9 g/dL — ABNORMAL LOW (ref 13.0–17.0)
Immature Granulocytes: 0 %
Lymphocytes Relative: 17 %
Lymphs Abs: 0.9 10*3/uL (ref 0.7–4.0)
MCH: 32.7 pg (ref 26.0–34.0)
MCHC: 32.1 g/dL (ref 30.0–36.0)
MCV: 101.7 fL — ABNORMAL HIGH (ref 80.0–100.0)
Monocytes Absolute: 0.5 10*3/uL (ref 0.1–1.0)
Monocytes Relative: 9 %
Neutro Abs: 3.8 10*3/uL (ref 1.7–7.7)
Neutrophils Relative %: 70 %
Platelets: 163 10*3/uL (ref 150–400)
RBC: 3.03 MIL/uL — ABNORMAL LOW (ref 4.22–5.81)
RDW: 25.2 % — ABNORMAL HIGH (ref 11.5–15.5)
WBC: 5.4 10*3/uL (ref 4.0–10.5)
nRBC: 0 % (ref 0.0–0.2)

## 2022-09-18 LAB — COMPREHENSIVE METABOLIC PANEL
ALT: 21 U/L (ref 0–44)
AST: 32 U/L (ref 15–41)
Albumin: 3.2 g/dL — ABNORMAL LOW (ref 3.5–5.0)
Alkaline Phosphatase: 99 U/L (ref 38–126)
Anion gap: 9 (ref 5–15)
BUN: 35 mg/dL — ABNORMAL HIGH (ref 8–23)
CO2: 25 mmol/L (ref 22–32)
Calcium: 9.3 mg/dL (ref 8.9–10.3)
Chloride: 107 mmol/L (ref 98–111)
Creatinine, Ser: 1.58 mg/dL — ABNORMAL HIGH (ref 0.61–1.24)
GFR, Estimated: 42 mL/min — ABNORMAL LOW (ref >60)
Glucose, Bld: 121 mg/dL — ABNORMAL HIGH (ref 70–99)
Potassium: 4 mmol/L (ref 3.5–5.1)
Sodium: 141 mmol/L (ref 135–145)
Total Bilirubin: 0.5 mg/dL (ref 0.3–1.2)
Total Protein: 6.7 g/dL (ref 6.5–8.1)

## 2022-09-18 LAB — FERRITIN: Ferritin: 474 ng/mL — ABNORMAL HIGH (ref 24–336)

## 2022-09-18 LAB — SAMPLE TO BLOOD BANK

## 2022-09-22 ENCOUNTER — Ambulatory Visit: Payer: PPO | Admitting: Cardiology

## 2022-09-22 ENCOUNTER — Telehealth: Payer: Self-pay | Admitting: *Deleted

## 2022-09-22 ENCOUNTER — Encounter: Payer: Self-pay | Admitting: Cardiology

## 2022-09-22 VITALS — BP 108/49 | HR 75 | Resp 16 | Ht 68.0 in | Wt 177.2 lb

## 2022-09-22 DIAGNOSIS — Z8719 Personal history of other diseases of the digestive system: Secondary | ICD-10-CM | POA: Diagnosis not present

## 2022-09-22 DIAGNOSIS — I10 Essential (primary) hypertension: Secondary | ICD-10-CM | POA: Diagnosis not present

## 2022-09-22 DIAGNOSIS — I5032 Chronic diastolic (congestive) heart failure: Secondary | ICD-10-CM | POA: Diagnosis not present

## 2022-09-22 DIAGNOSIS — I4821 Permanent atrial fibrillation: Secondary | ICD-10-CM | POA: Diagnosis not present

## 2022-09-22 MED ORDER — DAPAGLIFLOZIN PROPANEDIOL 5 MG PO TABS
5.0000 mg | ORAL_TABLET | Freq: Every morning | ORAL | Status: AC
Start: 2022-09-22 — End: ?

## 2022-09-22 NOTE — Telephone Encounter (Signed)
Transition Care Management Unsuccessful Follow-up Telephone Call  Date of discharge and from where:  [facility]  Attempts:  2nd Attempt  Reason for unsuccessful TCM follow-up call:  No answer/busy

## 2022-09-22 NOTE — Progress Notes (Signed)
Primary Physician/Referring:  [name], MD  Patient ID: Carl Woods, male    DOB: August 10, 1931, 89 y.o.   MRN: [id number]  Chief Complaint  Patient presents with   Permanent atrial fibrillation   Follow-up    3 months   HPI:    Carl Woods  is a 89 y.o.Caucasian male with permanent atrial fibrillation, obstructive sleep apnea on CPAP and compliant, hypertension, diabetes mellitus with stage IIIa chronic kidney disease, mild centrilobular emphysema, last hospitalization with severe GI bleed and admitted to the hospital on 07/06/2022 requiring 4 units of packed RBC transfusion, hypertension and atrial fibrillation.  He has been closely followed by hematology and oncology as well.  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  He also has peptic ulcer disease as well. He has no specific complaints from cardiac standpoint.  He has not had any leg edema, denies PND or orthopnea.  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   Left Forehead (treatment after biopsy)   OSA on CPAP    Perianal rash    Recurrent epistaxis    Renal cyst 01/04/2013   Small left peripelvic renal cysts , noted on US Renal   SCCA (squamous cell carcinoma) of skin 10/17/2015   Left Sup Bridge of Nose (curet, cautery and 5FU)   Seasonal allergies    Superficial basal cell  carcinoma (BCC) 10/17/2015   Left Bulb of Nose (curet, cautery and 5FU)   Tubular adenoma    Past Surgical History:  Procedure Laterality Date   BILIARY STENT PLACEMENT N/A 01/23/2022   Procedure: BILIARY STENT PLACEMENT;  Surgeon: Lemar Lofty., MD;  Location: [facility];  Service: Gastroenterology;  Laterality: N/A;   BILIARY STENT PLACEMENT N/A 08/06/2022   Procedure: BILIARY STENT PLACEMENT;  Surgeon: Lynann Bologna, MD;  Location: [facility];  Service: Gastroenterology;  Laterality: N/A;   BIOPSY  01/23/2022   Procedure: BIOPSY;  Surgeon: Meridee Score Netty Starring., MD;  Location: [facility];  Service: Gastroenterology;;   BIOPSY  07/09/2022   Procedure: BIOPSY;  Surgeon: Shellia Cleverly, DO;  Location: [facility];  Service: Gastroenterology;;   BIOPSY  08/02/2022   Procedure: BIOPSY;  Surgeon: Sherrilyn Rist, MD;  Location: [facility];  Service: Gastroenterology;;   COLONOSCOPY  multiple   CYSTOSCOPY     ENDOSCOPIC RETROGRADE CHOLANGIOPANCREATOGRAPHY (ERCP) WITH PROPOFOL N/A 01/23/2022   Procedure: ENDOSCOPIC RETROGRADE CHOLANGIOPANCREATOGRAPHY (ERCP) WITH PROPOFOL;  Surgeon: Lemar Lofty., MD;  Location: [facility];  Service: Gastroenterology;  Laterality: N/A;   ENTEROSCOPY N/A 08/02/2022   Procedure: ENTEROSCOPY;  Surgeon: Sherrilyn Rist, MD;  Location: [facility];  Service: Gastroenterology;  Laterality: N/A;   ERCP N/A 08/06/2022   Procedure: ENDOSCOPIC RETROGRADE CHOLANGIOPANCREATOGRAPHY (ERCP);  Surgeon:  Lynann Bologna, MD;  Location: [facility];  Service: Gastroenterology;  Laterality: N/A;   ESOPHAGOGASTRODUODENOSCOPY  multiple   ESOPHAGOGASTRODUODENOSCOPY N/A 01/23/2022   Procedure: ESOPHAGOGASTRODUODENOSCOPY (EGD);  Surgeon: Lemar Lofty., MD;  Location: [facility];  Service: Gastroenterology;  Laterality: N/A;   ESOPHAGOGASTRODUODENOSCOPY N/A 07/09/2022   Procedure: ESOPHAGOGASTRODUODENOSCOPY (EGD);  Surgeon: Shellia Cleverly, DO;  Location: [facility];  Service: Gastroenterology;  Laterality: N/A;   ESOPHAGOGASTRODUODENOSCOPY N/A 08/21/2022   Procedure: ESOPHAGOGASTRODUODENOSCOPY (EGD);  Surgeon: Jenel Lucks, MD;  Location[facility];  Service: Gastroenterology;  Laterality: N/A;   EUS N/A 01/23/2022   Procedure: UPPER ENDOSCOPIC ULTRASOUND (EUS) RADIAL;  Surgeon: Lemar Lofty., MD;  Location: [facility];  Service: Gastroenterology;  Laterality: N/A;   EVALUATION UNDER ANESTHESIA WITH FISTULECTOMY N/A 03/02/2018   Procedure: ANORECTAL EXAM UNDER ANESTHESIA WITH REPAIR OF SUPERFICIAL PERIRECTAL FISTULA AND HEMORRHOIDECTOMY;  Surgeon: [name], MD;  Location: [facility];  Service: General;  Laterality: N/A;   EXPLORATORY LAPAROTOMY     HEMOSTASIS CLIP PLACEMENT  07/09/2022   Procedure: HEMOSTASIS CLIP PLACEMENT;  Surgeon: Shellia Cleverly, DO;  Location: [facility];  Service: Gastroenterology;;   HOT HEMOSTASIS N/A 08/02/2022   Procedure: HOT HEMOSTASIS (ARGON PLASMA COAGULATION/BICAP);  Surgeon: Sherrilyn Rist, MD;  Location: [facility];  Service: Gastroenterology;  Laterality: N/A;   HOT HEMOSTASIS N/A 08/21/2022   Procedure: HOT HEMOSTASIS (ARGON PLASMA COAGULATION/BICAP);  Surgeon: Jenel Lucks, MD;  Location: [facility];  Service: Gastroenterology;  Laterality: N/A;   IR THORACENTESIS ASP PLEURAL SPACE W/IMG GUIDE  07/10/2022   REMOVAL OF STONES  01/23/2022   Procedure: REMOVAL OF STONES;  Surgeon: Meridee Score Netty Starring., MD;  Location: [facility];  Service: Gastroenterology;;   REMOVAL OF STONES  08/06/2022   Procedure: REMOVAL OF STONES;  Surgeon: Lynann Bologna, MD;  Location: [facility];  Service: Gastroenterology;;   Dennison Mascot  01/23/2022   Procedure: Dennison Mascot;  Surgeon: Mansouraty, Netty Starring., MD;  Location: [facility];  Service: Gastroenterology;;     Social History   Tobacco Use   Smoking status: Former    Current packs/day: 0.00    Average  packs/day: 0.5 packs/day for 40.0 years (20.0 ttl pk-yrs)    Types: Cigarettes    Start date: 1966    Quit date: 2006    Years since quitting: 18.6   Smokeless tobacco: Never  Substance Use Topics   Alcohol use: Yes    Comment: occasional wine   Marital Status: Married  ROS  Review of Systems  Constitutional: Positive for malaise/fatigue.  Cardiovascular:  Negative for chest pain, dyspnea on exertion and leg swelling.   Objective      09/22/2022   12:56 PM 09/12/2022    4:09 PM 09/12/2022   12:30 PM  Vitals with BMI  Height 5\' 8"     Weight 177 lbs 3 oz    BMI 26.95    Systolic 108 110 782  Diastolic 49 51 54  Pulse 75 62 61   SpO2: 100 %  Physical Exam Neck:     Vascular: No carotid bruit or JVD.  Cardiovascular:     Rate and Rhythm: Normal rate. Rhythm irregular.     Pulses:          Dorsalis pedis pulses are 0 on the right side and 0 on the left side.       Posterior tibial pulses are 0 on the right side and 0 on the left side.     Heart sounds:  No murmur heard. Pulmonary:     Effort: Pulmonary effort is normal.     Breath sounds: Normal breath sounds.  Abdominal:     General: Bowel sounds are normal.     Palpations: Abdomen is soft.  Musculoskeletal:     Right lower leg: No edema.     Left lower leg: No edema.  Skin:    Capillary Refill: Capillary refill takes less than 2 seconds.     Laboratory examination:   Recent Labs    09/10/22 1115 09/12/22 1233 09/18/22 1116  NA 138 139 141  K 3.9 4.1 4.0  CL 107 110 107  CO2 25 21* 25  GLUCOSE 128* 109* 121*  BUN 35* 37* 35*  CREATININE 1.67* 1.76* 1.58*  CALCIUM 8.2* 8.5* 9.3  GFRNONAA 39* 37* 42*    Lab Results  Component Value Date   GLUCOSE 121 (H) 09/18/2022   NA 141 09/18/2022   K 4.0 09/18/2022   CL 107 09/18/2022   CO2 25 09/18/2022   BUN 35 (H) 09/18/2022   CREATININE 1.58 (H) 09/18/2022   GFRNONAA 42 (L) 09/18/2022   CALCIUM 9.3 09/18/2022   PHOS 3.2 08/03/2022   PROT 6.7  09/18/2022   ALBUMIN 3.2 (L) 09/18/2022   BILITOT 0.5 09/18/2022   ALKPHOS 99 09/18/2022   AST 32 09/18/2022   ALT 21 09/18/2022   ANIONGAP 9 09/18/2022      Lab Results  Component Value Date   ALT 21 09/18/2022   AST 32 09/18/2022   ALKPHOS 99 09/18/2022   BILITOT 0.5 09/18/2022       Latest Ref Rng & Units 09/18/2022   11:16 AM 09/12/2022   12:33 PM 09/10/2022   11:15 AM  Hepatic Function  Total Protein 6.5 - 8.1 g/dL 6.7  6.0  5.9   Albumin 3.5 - 5.0 g/dL 3.2  2.8  2.9   AST 15 - 41 U/L 32  34  19   ALT 0 - 44 U/L 21  19  13    Alk Phosphatase 38 - 126 U/L 99  76  80   Total Bilirubin 0.3 - 1.2 mg/dL 0.5  0.8  0.4       Latest Ref Rng & Units 09/18/2022   11:16 AM 09/12/2022   12:33 PM 09/11/2022   12:11 PM  CBC  WBC 4.0 - 10.5 K/uL 5.4  5.0  5.3   Hemoglobin 13.0 - 17.0 g/dL 9.9  9.2  9.2   Hematocrit 39.0 - 52.0 % 30.8  29.2  28.2   Platelets 150 - 400 K/uL 163  177  145     External labs:   Cholesterol, total 92.000 mg 09/26/2020 HDL 31.000 mg 09/26/2020 LDL 46.000 MG 05/22/2020 Triglycerides 70.000 mg 08/13/2021  A1C 6.500 % 08/13/2021  Radiology:    Cardiac Studies:   PCV MYOCARDIAL PERFUSION WITH LEXISCAN 02/08/2020  Narrative Lexiscan Tetrofosmin stress test 02/08/2020: Eugenie Birks nuclear stress test performed using 1-day protocol. Normal myocardial perfusion. Stress LVEF 76%. Low risk study.  PCV ECHOCARDIOGRAM COMPLETE 06/18/2021  Narrative Echocardiogram 06/18/2021: Normal LV systolic function with visual EF 55-60%. Left ventricle cavity is normal in size. Normal left ventricular wall thickness. Normal global wall motion. Unable to evaluate diastolic function due to atrial fibrillation. Trace aortic regurgitation. Mild tricuspid regurgitation. No evidence of pulmonary hypertension. RVSP measures 33 mmHg. Compared to 02/06/2020 small pericardial effusion has resolved otherwise no significant change.  EKG:   EKG 09/22/2022: Atrial fibrillation  with controlled ventricular response at  the rate of 64 bpm, diffuse nonspecific ST-T abnormality.  Compared to 07/22/2022, no significant change.  Medications and allergies   Allergies  Allergen Reactions   Nsaids Other (See Comments)    Patient is to not take these because of kidney issues     Medication list   Current Outpatient Medications:    bumetanide (BUMEX) 1 MG tablet, Take 1 tablet (1 mg total) by mouth daily., Disp: , Rfl:    Cholecalciferol (VITAMIN D3) 5000 units CAPS, Take 5,000 Units by mouth every morning., Disp: , Rfl:    ferrous sulfate 325 (65 FE) MG tablet, Take 325 mg by mouth every Monday, Wednesday, and Friday., Disp: , Rfl:    Glucosamine Sulfate 500 MG TABS, Take 500 mg by mouth daily., Disp: , Rfl:    levothyroxine (SYNTHROID) 150 MCG tablet, Take 150 mcg by mouth daily before breakfast., Disp: , Rfl:    nitroGLYCERIN (NITROSTAT) 0.4 MG SL tablet, Place 1 tablet (0.4 mg total) under the tongue every 5 (five) minutes as needed for up to 25 days for chest pain., Disp: 25 tablet, Rfl: 3   olopatadine (PATANOL) 0.1 % ophthalmic solution, Place 1 drop into both eyes daily as needed for allergies., Disp: , Rfl:    pantoprazole (PROTONIX) 40 MG tablet, Take 1 tablet (40 mg total) by mouth 2 (two) times daily., Disp: , Rfl:    potassium chloride SA (KLOR-CON M) 20 MEQ tablet, Take 1 tablet (20 mEq total) by mouth 2 (two) times daily., Disp: 60 tablet, Rfl: 0   sodium chloride (OCEAN) 0.65 % SOLN nasal spray, Place 1 spray into both nostrils as needed for congestion., Disp: , Rfl:    sucralfate (CARAFATE) 1 GM/10ML suspension, Take 10 mLs (1 g total) by mouth 4 (four) times daily -  with meals and at bedtime., Disp: 420 mL, Rfl: 0   dapagliflozin propanediol (FARXIGA) 5 MG TABS tablet, Take 1 tablet (5 mg total) by mouth in the morning., Disp: , Rfl:   Assessment     ICD-10-CM   1. Permanent atrial fibrillation (HCC)  I48.21 EKG 12-Lead    2. Primary hypertension  I10      3. H/O: upper GI bleed  Z87.19     4. Chronic diastolic heart failure (HCC)  I69.62 dapagliflozin propanediol (FARXIGA) 5 MG TABS tablet       Orders Placed This Encounter  Procedures   EKG 12-Lead    Meds ordered this encounter  Medications   dapagliflozin propanediol (FARXIGA) 5 MG TABS tablet    Sig: Take 1 tablet (5 mg total) by mouth in the morning.    Medications Discontinued During This Encounter  Medication Reason   dapagliflozin propanediol (FARXIGA) 10 MG TABS tablet Reorder      CHA2DS2-VASc Score is 4.  Yearly risk of stroke: 5% (A, HTN, DM).  Score of 1=0.6; 2=2.2; 3=3.2; 4=4.8; 5=7.2; 6=9.8; 7=>9.8) -(CHF; HTN; vasc disease DM,  Male = 1; Age <65 =0; 65-74 = 1,  >75 =2; stroke/embolism= 2).   Recommendations:   Carl Woods is a 89 y.o.  Caucasian male with permanent atrial fibrillation, obstructive sleep apnea on CPAP and compliant, hypertension, diabetes mellitus with stage IIIa chronic kidney disease, mild centrilobular emphysema, last hospitalization with severe GI bleed and admitted to the hospital on 07/06/2022 requiring 4 units of packed RBC transfusion, hypertension and atrial fibrillation.  He has been closely followed by hematology and oncology as well.  He has now been diagnosed with cancer  of ampulla of Vater and is not a surgical candidate hence undergoing chemotherapy and radiation.  He also has peptic ulcer disease as well.  1. Permanent atrial fibrillation [facility]) Patient with permanent atrial fibrillation, not a candidate for anticoagulation and not a candidate for Watchman device either.  Fortunately he has not had any strokelike symptoms. - EKG 12-Lead  2. Primary hypertension Blood pressure is now well-controlled, he is not on any antihypertensive medication except Bumex in view of stage IIIb chronic kidney disease, chronic diastolic heart failure and also on Farxiga 10 mg daily.  Due to low blood pressure, recent worsening renal function, not  on an ACE inhibitor or ARB is presently undergoing radiation therapy for cancer of ampulla of Vater.  Presently in the assisted living facility.  Previously was independent.  3. H/O: upper GI bleed In view of GI bleed, not a candidate for anticoagulation.  4. Chronic diastolic heart failure (HCC) In view of weight loss, underlying anemia, malignancy, his blood pressure is now soft.  I will also reduce the dose of Farxiga to 5 mg daily in view of stage IIIb chronic kidney disease as well.  He is presently on 1 mg of Bumex, consider reducing to 2.5 mg if there is no recurrence of heart failure-like symptoms.  In view of his advanced age, comorbidity, stable cardiac status, I will see him back on a as needed basis.  I discussed this with his son on the telephone.  - dapagliflozin propanediol (FARXIGA) 5 MG TABS tablet; Take 1 tablet (5 mg total) by mouth in the morning.   1. Permanent atrial fibrillation [facility]) Patient with permanent atrial fibrillation, he is rate controlled.  Would not recommend restarting any anticoagulation in view of advanced age, underlying chronic renal insufficiency, recurrent GI bleed and severe anemia and thrombocytopenia.  I discussed with his daughter over the phone Ms. [name] regarding the risk of bleeding versus risk of stroke.  She is aware of this. - EKG 12-Lead  2. H/O: upper GI bleed I evaluated the hospital records, I reconciled his medications, make sure that he is not on anticoagulants.  I also sent a message to the assisted living facility not to start him on any aspirin.  3. Primary hypertension Blood pressure is well-controlled.  4. Stage 3a chronic kidney disease (HCC) Stage III chronic kidney disease has remained stable.  In view of this along with low platelet count, advanced age, he is probably auto anticoagulated at least to some degree.  Irrespective, he has got extreme high risk for recurrence of GI bleed and life-threatening anemia.  He is now 89  years of age, do not think that proceeding with left atrial appendage closure would be appropriate at this time at least at this juncture as he will not be able to tolerate any kind of anticoagulation even for short-term.  I will see him back in 6 months for follow-up.   [name], MD, [facility] 09/22/2022, 1:49 PM Office: [phone]

## 2022-09-23 DIAGNOSIS — I10 Essential (primary) hypertension: Secondary | ICD-10-CM | POA: Diagnosis not present

## 2022-09-23 DIAGNOSIS — Z79899 Other long term (current) drug therapy: Secondary | ICD-10-CM | POA: Diagnosis not present

## 2022-09-23 DIAGNOSIS — E039 Hypothyroidism, unspecified: Secondary | ICD-10-CM | POA: Diagnosis not present

## 2022-09-23 DIAGNOSIS — D649 Anemia, unspecified: Secondary | ICD-10-CM | POA: Diagnosis not present

## 2022-09-25 ENCOUNTER — Inpatient Hospital Stay: Payer: PPO | Admitting: Hematology

## 2022-09-25 ENCOUNTER — Inpatient Hospital Stay: Payer: PPO

## 2022-09-25 ENCOUNTER — Encounter: Payer: Self-pay | Admitting: Hematology

## 2022-09-25 VITALS — BP 99/61 | HR 60 | Temp 97.4°F | Resp 16

## 2022-09-25 VITALS — BP 102/60 | HR 57 | Temp 97.5°F | Resp 16

## 2022-09-25 DIAGNOSIS — D500 Iron deficiency anemia secondary to blood loss (chronic): Secondary | ICD-10-CM | POA: Diagnosis not present

## 2022-09-25 DIAGNOSIS — K5521 Angiodysplasia of colon with hemorrhage: Secondary | ICD-10-CM

## 2022-09-25 DIAGNOSIS — Z5112 Encounter for antineoplastic immunotherapy: Secondary | ICD-10-CM | POA: Diagnosis not present

## 2022-09-25 DIAGNOSIS — C241 Malignant neoplasm of ampulla of Vater: Secondary | ICD-10-CM | POA: Diagnosis not present

## 2022-09-25 DIAGNOSIS — D649 Anemia, unspecified: Secondary | ICD-10-CM

## 2022-09-25 LAB — SAMPLE TO BLOOD BANK

## 2022-09-25 LAB — COMPREHENSIVE METABOLIC PANEL
ALT: 35 U/L (ref 0–44)
AST: 40 U/L (ref 15–41)
Albumin: 3.3 g/dL — ABNORMAL LOW (ref 3.5–5.0)
Alkaline Phosphatase: 126 U/L (ref 38–126)
Anion gap: 8 (ref 5–15)
BUN: 47 mg/dL — ABNORMAL HIGH (ref 8–23)
CO2: 26 mmol/L (ref 22–32)
Calcium: 9.5 mg/dL (ref 8.9–10.3)
Chloride: 106 mmol/L (ref 98–111)
Creatinine, Ser: 1.5 mg/dL — ABNORMAL HIGH (ref 0.61–1.24)
GFR, Estimated: 44 mL/min — ABNORMAL LOW (ref >60)
Glucose, Bld: 105 mg/dL — ABNORMAL HIGH (ref 70–99)
Potassium: 4.3 mmol/L (ref 3.5–5.1)
Sodium: 140 mmol/L (ref 135–145)
Total Bilirubin: 0.4 mg/dL (ref 0.3–1.2)
Total Protein: 7 g/dL (ref 6.5–8.1)

## 2022-09-25 LAB — CBC WITH DIFFERENTIAL/PLATELET
Abs Immature Granulocytes: 0.01 10*3/uL (ref 0.00–0.07)
Basophils Absolute: 0 10*3/uL (ref 0.0–0.1)
Basophils Relative: 1 %
Eosinophils Absolute: 0.1 10*3/uL (ref 0.0–0.5)
Eosinophils Relative: 3 %
HCT: 30.8 % — ABNORMAL LOW (ref 39.0–52.0)
Hemoglobin: 9.9 g/dL — ABNORMAL LOW (ref 13.0–17.0)
Immature Granulocytes: 0 %
Lymphocytes Relative: 19 %
Lymphs Abs: 0.8 10*3/uL (ref 0.7–4.0)
MCH: 32.9 pg (ref 26.0–34.0)
MCHC: 32.1 g/dL (ref 30.0–36.0)
MCV: 102.3 fL — ABNORMAL HIGH (ref 80.0–100.0)
Monocytes Absolute: 0.4 10*3/uL (ref 0.1–1.0)
Monocytes Relative: 9 %
Neutro Abs: 2.8 10*3/uL (ref 1.7–7.7)
Neutrophils Relative %: 68 %
Platelets: 155 10*3/uL (ref 150–400)
RBC: 3.01 MIL/uL — ABNORMAL LOW (ref 4.22–5.81)
RDW: 23.8 % — ABNORMAL HIGH (ref 11.5–15.5)
WBC: 4.1 10*3/uL (ref 4.0–10.5)
nRBC: 0 % (ref 0.0–0.2)

## 2022-09-25 LAB — FERRITIN: Ferritin: 468 ng/mL — ABNORMAL HIGH (ref 24–336)

## 2022-09-25 MED ORDER — SODIUM CHLORIDE 0.9 % IV SOLN
5.0000 mg/kg | Freq: Once | INTRAVENOUS | Status: AC
  Administered 2022-09-25: 400 mg via INTRAVENOUS
  Filled 2022-09-25: qty 16

## 2022-09-25 MED ORDER — ONDANSETRON HCL 8 MG PO TABS: 8.0000 mg | ORAL_TABLET | Freq: Three times a day (TID) | ORAL | 0 refills | Status: DC | PRN

## 2022-09-25 MED ORDER — SODIUM CHLORIDE 0.9 % IV SOLN: Freq: Once | INTRAVENOUS | Status: AC

## 2022-09-25 NOTE — Assessment & Plan Note (Signed)
 diagnosed in 12/2021 -I reviewed PET scan findings, which showed no evidence of distant metastasis  -He was evaluated by pancreatobiliary surgeon Dr. Donell Beers, and felt to be a poor candidate for Whipple surgery. -Patient has decided to proceed with radiation, I discussed the option of concurrent chemotherapy with Xeloda as a radiation sensitizer.  Potential benefit and side effect discussed with patient.  Patient has chronic kidney disease, we will repeat labs today to see if he is a candidate for Xeloda with dose reduction. -Her GFR today is 33, this is borderline for Xeloda.  Given his advanced age, relatively very early stage disease, and the limited benefit of concurrent chemotherapy, I do not recommend the Xeloda or 5-fu.  He will proceed with radiation alone. -I reviewed his molecular testing, MMR was normal, he is not a candidate for immunotherapy.  Foundation One showed no targetable mutations. -He is finishing RT on 04/09/2022 -He is scheduled for next EGD and ERCP in about 2 months. -Due to advanced age, I do not plan to offer him chemotherapy -CT scan in June 2024 showed no evidence of residual disease or new metastasis, although CT is not ideal to evaluate the residual disease.

## 2022-09-25 NOTE — Assessment & Plan Note (Addendum)
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  -He has required multiple blood transfusions -We discussed the benefit and side effect of bevacizumab for AVM related GI bleeding, he started on 09/11/22, plan to give every 2 weeks for total of 4 cycles.

## 2022-09-25 NOTE — Progress Notes (Signed)
[facility]   [phone] [phone]   Clinic Follow up Note   Patient Care Team: [name], MD as PCP - General (Internal Medicine) [name], MD as Consulting Physician (General Surgery) [name], MD as Consulting Physician (Gastroenterology) [name], MD as Consulting Physician (Otolaryngology) [name], MD as Consulting Physician (Cardiology) Suzi Roots as Physician Assistant (Dermatology) Malachy Mood, MD as Consulting Physician (Oncology)  Date of Service:  09/25/2022  CHIEF COMPLAINT: f/u of ampullary cancer, acute on chronic anemia, recurrent GIB   CURRENT THERAPY:  Bevacizumab q14d  ASSESSMENT:  Carl Woods is a 89 y.o. 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  -He has required multiple blood transfusions -We discussed the benefit and side effect of bevacizumab for AVM related GI bleeding, he started on 09/11/22, plan to give every 2 weeks for total of 4 cycles.  Cancer of ampulla of Vater (HCC) diagnosed in 12/2021 -I reviewed PET scan findings, which showed no evidence of distant metastasis  -He was evaluated by pancreatobiliary surgeon Dr. Donell Woods, and felt to be a poor candidate for Whipple surgery. -Patient has decided to proceed with radiation, I discussed the option of concurrent chemotherapy with Xeloda as a radiation sensitizer.  Potential benefit and side effect discussed with patient.  Patient has chronic kidney disease, we will repeat labs today to see if he is a candidate for Xeloda with dose reduction. -Her GFR today is 33, this is borderline for Xeloda.  Given his advanced age, relatively very early stage disease, and the limited benefit of concurrent chemotherapy, I do not recommend the Xeloda or  5-fu.  He will proceed with radiation alone. -I reviewed his molecular testing, MMR was normal, he is not a candidate for immunotherapy.  Foundation One showed no targetable mutations. -He is finishing RT on 04/09/2022 -He is scheduled for next EGD and ERCP in about 2 months. -Due to advanced age, I do not plan to offer him chemotherapy -CT scan in June 2024 showed no evidence of residual disease or new metastasis, although CT is not ideal to evaluate the residual disease.        PLAN: - call in nausea meds zofran  - continue protonix for stomach acid reflex  - reviewed labs, no blood transfusion needed today  - continue to 2nd treatment of beva  - f/u and labs and C3 beva in 2 weeks   SUMMARY OF ONCOLOGIC HISTORY: Oncology History  Cancer of ampulla of Vater (HCC)  12/06/2021 Imaging   CT ABDOMEN PELVIS W CONTRAST   IMPRESSION: 1. There is moderate intrahepatic bile duct dilatation with marked fusiform dilatation of the common bile duct. No CT visible common bile duct stones identified. Differential considerations include a obstructing distal common bile duct stone (not visible by CT), distal CBD stricture, or mass at the level of the ampullary. Consider further evaluation with contrast enhanced MRI/MRCP or ERCP. 2. Contour the liver appears nodular which may reflect underlying cirrhosis. 3. Age-indeterminate compression fracture involving L1 vertebral body with loss of 50% of the vertebral body height. No signs of retropulsion of fracture fragments into the canal. 4. Fat containing umbilical hernia. 5. 5 mm anterolisthesis of L4 on L5. 6.  Aortic Atherosclerosis (ICD10-I70.0).   12/16/2021 Imaging   MR ABDOMEN MRCP  W WO CONTAST   IMPRESSION: 1. Moderate intrahepatic and extrahepatic bile duct dilation with smooth tapering to the level of the ampulla without filling defect or focal mass lesion identified. Findings may reflect ampullary stricture or occult ampullary  mass. Consider further evaluation with ERCP. 2. Mild dilated main pancreatic duct at the head measuring up to 8 mm. 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 Recommend follow-up pre and post-contrast MRI/MRCP in 2 years. This recommendation follows ACR consensus guidelines: Management of Incidental Pancreatic Cysts: A White Paper of the ACR Incidental Findings Committee. J Am Coll Radiol 2017;14:911-923. 3.  Aortic Atherosclerosis (ICD10-I70.0).   01/23/2022 Procedure   DG ERCP   IMPRESSION: Dilatation of the extrahepatic bile duct with severe tapering or narrowing in the distal common bile duct. Placement of biliary stent.   These images were submitted for radiologic interpretation only. Please see the procedural report for the amount of contrast.    01/23/2022 Procedure   EUS/ERCP  ENDOSONOGRAPHIC FINDING: There was dilation in the common bile duct (12.3 mm -> 20.4 mm) and in the common hepatic duct (23.0 mm). A small amount of hyperechoic material consistent with sludge was visualized endosonographically in the common bile duct. Moderate hyperechoic material consistent with sludge was visualized endosonographically in the gallbladder with normal gallbladder wall thickness. Pancreatic parenchymal abnormalities were noted in the entire pancreas. These consisted of hyperechoic foci with shadowing and cysts. There was a 9.1 mm by 6.1 mm cyst in the neck of the pancreas. There was a 15.3 mm by 14.1 mm cyst in the tail of the pancreas. The pancreatic duct had a dilated endosonographic appearance, had a prominently branched endosonographic appearance and had hyperechoic walls in the pancreatic head (PD - 8.2 mm -> 4.3 mm), genu of the pancreas (3.5 mm), body of the pancreas (3.2 mm) and tail of the pancreas (2.8 mm). A hypoechoic irregular lesion was identified endosonographically at the  ampulla. The lesion measured 15 mm by 13 mm in maximal cross-sectional diameter. The lesion extended from the mucosa to the submucosa. The outer margins were irregular. This is noted where CBD and PD dilation is noted to occur. Endosonographic imaging in the visualized portion of the liver showed no mass.  No malignant-appearing lymph nodes were visualized in the celiac region (level 20), peripancreatic region and porta hepatis region. The celiac region was visualized.   01/23/2022 Pathology Results   SURGICAL PATHOLOGY  CASE: [id number]  PATIENT: Carl Woods  Surgical Pathology Report   FINAL MICROSCOPIC DIAGNOSIS:   A. STOMACH, RANDOM, BIOPSY:  - Gastric mucosa, no significant abnormality.  No inflammation,  intestinal metaplasia, dysplasia or malignancy.   B. AMPULLARY LESION, BIOPSY:  - Adenocarcinoma.  See comment.    01/31/2022 Initial Diagnosis   Cancer of ampulla of Vater (HCC)   02/07/2022 Imaging    IMPRESSION: Interval increase in size of lymph node in the anterior cardiophrenic space on the right.   Increase small right pleural effusion compared to the previous MRI.   Subtle nodular tissue along the margin of the right hepatic lobe in the upper abdomen posteriorly. Recommend additional workup when appropriate.   Calcified pleural plaques.   Evidence of chronic liver disease.   Aortic Atherosclerosis (ICD10-I70.0).   02/14/2022 Miscellaneous   Foundation One:  Biomarker Findings Microsatellite status-Cannot be determined Tumor Mutation Burden-Cannot be determined  Genomics Findings EPHB4 amplificatio SF3B1 K666T TP53 R273H  INTERVAL HISTORY:  Carl Woods is here for a follow up of ampullary cancer, acute on chronic anemia, recurrent GIB. He was last seen by me on 8/15. He presents to the clinic today with his son. Patient stated that he had no problems after his last infusion. He had some nausea but only lasted a few days.     All other  systems were reviewed with the patient and are negative.  MEDICAL HISTORY:  Past Medical History:  Diagnosis Date   Anal fistula    Arthritis    Fingers and hands   Atrial fibrillation (HCC)    AVM (arteriovenous malformation)    Clipped during Colonoscopy 05/2016   Barrett's esophagus    Bilateral cataracts    BPH (benign prostatic hyperplasia)    Cecal angiodysplasia 05/28/2016   ablated at colonoscopy   Deviated nasal septum    Diverticulosis of sigmoid colon    E. 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   Left Forehead (treatment after biopsy)   OSA on CPAP    Perianal rash    Recurrent epistaxis    Renal cyst 01/04/2013   Small left peripelvic renal cysts , noted on US Renal   SCCA (squamous cell carcinoma) of skin 10/17/2015   Left Sup Bridge of Nose (curet, cautery and 5FU)   Seasonal allergies    Superficial basal cell carcinoma (BCC) 10/17/2015   Left Bulb of Nose (curet, cautery and 5FU)   Tubular adenoma     SURGICAL HISTORY: Past Surgical History:  Procedure Laterality Date   BILIARY STENT PLACEMENT N/A 01/23/2022   Procedure: BILIARY STENT PLACEMENT;  Surgeon: Lemar Lofty., MD;  Location: [facility];  Service: Gastroenterology;  Laterality: N/A;   BILIARY STENT PLACEMENT N/A 08/06/2022   Procedure: BILIARY STENT PLACEMENT;  Surgeon: Lynann Bologna, MD;  Location: [facility];  Service: Gastroenterology;  Laterality: N/A;   BIOPSY  01/23/2022   Procedure: BIOPSY;  Surgeon: Meridee Score Netty Starring., MD;  Location: [facility];  Service: Gastroenterology;;   BIOPSY  07/09/2022   Procedure: BIOPSY;  Surgeon: Shellia Cleverly, DO;  Location: [facility];  Service: Gastroenterology;;   BIOPSY  08/02/2022   Procedure: BIOPSY;  Surgeon: Sherrilyn Rist, MD;  Location: [facility];  Service:  Gastroenterology;;   COLONOSCOPY  multiple   CYSTOSCOPY     ENDOSCOPIC RETROGRADE CHOLANGIOPANCREATOGRAPHY (ERCP) WITH PROPOFOL N/A 01/23/2022   Procedure: ENDOSCOPIC RETROGRADE CHOLANGIOPANCREATOGRAPHY (ERCP) WITH PROPOFOL;  Surgeon: Lemar Lofty., MD;  Location: [facility];  Service: Gastroenterology;  Laterality: N/A;   ENTEROSCOPY N/A 08/02/2022   Procedure: ENTEROSCOPY;  Surgeon: Sherrilyn Rist, MD;  Location: [facility];  Service: Gastroenterology;  Laterality: N/A;   ERCP N/A 08/06/2022   Procedure: ENDOSCOPIC RETROGRADE CHOLANGIOPANCREATOGRAPHY (ERCP);  Surgeon: Lynann Bologna, MD;  Location: [facility];  Service: Gastroenterology;  Laterality: N/A;   ESOPHAGOGASTRODUODENOSCOPY  multiple   ESOPHAGOGASTRODUODENOSCOPY N/A 01/23/2022   Procedure: ESOPHAGOGASTRODUODENOSCOPY (EGD);  Surgeon: Lemar Lofty., MD;  Location: [facility];  Service: Gastroenterology;  Laterality: N/A;   ESOPHAGOGASTRODUODENOSCOPY N/A 07/09/2022   Procedure: ESOPHAGOGASTRODUODENOSCOPY (EGD);  Surgeon: Shellia Cleverly, DO;  Location[facility];  Service: Gastroenterology;  Laterality: N/A;   ESOPHAGOGASTRODUODENOSCOPY N/A 08/21/2022   Procedure: ESOPHAGOGASTRODUODENOSCOPY (EGD);  Surgeon: Jenel Lucks, MD;  Location: [facility];  Service: Gastroenterology;  Laterality: N/A;   EUS N/A 01/23/2022   Procedure: UPPER ENDOSCOPIC ULTRASOUND (EUS) RADIAL;  Surgeon: Lemar Lofty., MD;  Location: [facility];  Service: Gastroenterology;  Laterality: N/A;   EVALUATION UNDER ANESTHESIA WITH FISTULECTOMY N/A 03/02/2018   Procedure: ANORECTAL EXAM UNDER ANESTHESIA WITH REPAIR OF SUPERFICIAL PERIRECTAL FISTULA AND HEMORRHOIDECTOMY;  Surgeon: [name], MD;  Location: [facility];  Service: General;  Laterality: N/A;   EXPLORATORY LAPAROTOMY     HEMOSTASIS CLIP PLACEMENT  07/09/2022   Procedure: HEMOSTASIS CLIP PLACEMENT;  Surgeon: Shellia Cleverly, DO;  Location: [facility];   Service: Gastroenterology;;   HOT HEMOSTASIS N/A 08/02/2022   Procedure: HOT HEMOSTASIS (ARGON PLASMA COAGULATION/BICAP);  Surgeon: Sherrilyn Rist, MD;  Location: [facility];  Service: Gastroenterology;  Laterality: N/A;   HOT HEMOSTASIS N/A 08/21/2022   Procedure: HOT HEMOSTASIS (ARGON PLASMA COAGULATION/BICAP);  Surgeon: Jenel Lucks, MD;  Location: [facility] ENDOSCOPY;  Service: Gastroenterology;  Laterality: N/A;   IR THORACENTESIS ASP PLEURAL SPACE W/IMG GUIDE  07/10/2022   REMOVAL OF STONES  01/23/2022   Procedure: REMOVAL OF STONES;  Surgeon: Meridee Score Netty Starring., MD;  Location: [facility];  Service: Gastroenterology;;   REMOVAL OF STONES  08/06/2022   Procedure: REMOVAL OF STONES;  Surgeon: Lynann Bologna, MD;  Location: [facility];  Service: Gastroenterology;;   Dennison Mascot  01/23/2022   Procedure: Dennison Mascot;  Surgeon: Mansouraty, Netty Starring., MD;  Location: [facility];  Service: Gastroenterology;;    I have reviewed the social history and family history with the patient and they are unchanged from previous note.  ALLERGIES:  is allergic to nsaids.  MEDICATIONS:  Current Outpatient Medications  Medication Sig Dispense Refill   ondansetron (ZOFRAN) 8 MG tablet Take 1 tablet (8 mg total) by mouth every 8 (eight) hours as needed for nausea or vomiting. 20 tablet 0   bumetanide (BUMEX) 1 MG tablet Take 1 tablet (1 mg total) by mouth daily.     Cholecalciferol (VITAMIN D3) 5000 units CAPS Take 5,000 Units by mouth every morning.     dapagliflozin propanediol (FARXIGA) 5 MG TABS tablet Take 1 tablet (5 mg total) by mouth in the morning.     ferrous sulfate 325 (65 FE) MG tablet Take 325 mg by mouth every Monday, Wednesday, and Friday.     Glucosamine Sulfate 500 MG TABS Take 500 mg by mouth daily.     levothyroxine (SYNTHROID) 150 MCG tablet Take 150 mcg by mouth daily before breakfast.     nitroGLYCERIN (NITROSTAT) 0.4 MG SL tablet Place 1 tablet (0.4 mg total) under  the tongue every 5 (five) minutes as needed for up to 25 days for chest pain. 25 tablet 3   olopatadine (PATANOL) 0.1 % ophthalmic solution Place 1 drop into both eyes daily as needed for allergies.     pantoprazole (PROTONIX) 40 MG tablet Take 1 tablet (40 mg total) by mouth 2 (two) times daily.     potassium chloride SA (KLOR-CON M) 20 MEQ tablet Take 1 tablet (20 mEq total) by mouth 2 (two) times daily. 60 tablet 0   sodium chloride (OCEAN) 0.65 % SOLN nasal spray Place 1 spray into both nostrils as needed for congestion.     sucralfate (CARAFATE) 1 GM/10ML suspension Take 10 mLs (1 g total) by mouth 4 (four) times daily -  with meals and at bedtime. 420 mL 0   No current facility-administered medications for this visit.    PHYSICAL EXAMINATION: ECOG PERFORMANCE STATUS: 3 -  Symptomatic, >50% confined to bed  Vitals:   09/25/22 0909  BP: 99/61  Pulse: 60  Resp: 16  Temp: (!) 97.4 F (36.3 C)    Wt Readings from Last 3 Encounters:  09/22/22 177 lb 3.2 oz (80.4 kg)  09/11/22 177 lb 3.2 oz (80.4 kg)  09/04/22 199 lb (90.3 kg)     GENERAL:alert, no distress and comfortable SKIN: skin color, texture, turgor are normal, no rashes or significant lesions EYES: normal, Conjunctiva are pink and non-injected, sclera clear NECK: supple, thyroid normal size, non-tender, without nodularity LYMPH:  no palpable lymphadenopathy in the cervical, axillary  LUNGS: clear to auscultation and percussion with normal breathing effort HEART: regular rate & rhythm and no murmurs and no lower extremity edema ABDOMEN:abdomen soft, non-tender and normal bowel sounds Musculoskeletal:no cyanosis of digits and no clubbing  NEURO: alert & oriented x 3 with fluent speech, no focal motor/sensory deficits  LABORATORY DATA:  I have reviewed the data as listed    Latest Ref Rng & Units 09/25/2022    8:50 AM 09/18/2022   11:16 AM 09/12/2022   12:33 PM  CBC  WBC 4.0 - 10.5 K/uL 4.1  5.4  5.0   Hemoglobin 13.0  - 17.0 g/dL 9.9  9.9  9.2   Hematocrit 39.0 - 52.0 % 30.8  30.8  29.2   Platelets 150 - 400 K/uL 155  163  177         Latest Ref Rng & Units 09/25/2022    8:50 AM 09/18/2022   11:16 AM 09/12/2022   12:33 PM  CMP  Glucose 70 - 99 mg/dL 497  026  378   BUN 8 - 23 mg/dL 47  35  37   Creatinine 0.61 - 1.24 mg/dL 5.88  5.02  7.74   Sodium 135 - 145 mmol/L 140  141  139   Potassium 3.5 - 5.1 mmol/L 4.3  4.0  4.1   Chloride 98 - 111 mmol/L 106  107  110   CO2 22 - 32 mmol/L 26  25  21    Calcium 8.9 - 10.3 mg/dL 9.5  9.3  8.5   Total Protein 6.5 - 8.1 g/dL 7.0  6.7  6.0   Total Bilirubin 0.3 - 1.2 mg/dL 0.4  0.5  0.8   Alkaline Phos 38 - 126 U/L 126  99  76   AST 15 - 41 U/L 40  32  34   ALT 0 - 44 U/L 35  21  19       RADIOGRAPHIC STUDIES: I have personally reviewed the radiological images as listed and agreed with the findings in the report. No results found.    No orders of the defined types were placed in this encounter.  All questions were answered. The patient knows to call the clinic with any problems, questions or concerns. No barriers to learning was detected. The total time spent in the appointment was 25 minutes.     Malachy Mood, MD 09/25/2022

## 2022-09-25 NOTE — Patient Instructions (Signed)
 [facility]  Discharge Instructions: Thank you for choosing [facility] to provide your oncology and hematology care.   If you have a lab appointment with the Cancer Center, please go directly to the Cancer Center and check in at the registration area.   Wear comfortable clothing and clothing appropriate for easy access to any Portacath or PICC line.   We strive to give you quality time with your provider. You may need to reschedule your appointment if you arrive late (15 or more minutes).  Arriving late affects you and other patients whose appointments are after yours.  Also, if you miss three or more appointments without notifying the office, you may be dismissed from the clinic at the provider's discretion.      For prescription refill requests, have your pharmacy contact our office and allow 72 hours for refills to be completed.    Today you received the following chemotherapy and/or immunotherapy agent: Bevacizumab   To help prevent nausea and vomiting after your treatment, we encourage you to take your nausea medication as directed.  BELOW ARE SYMPTOMS THAT SHOULD BE REPORTED IMMEDIATELY: *FEVER GREATER THAN 100.4 F (38 C) OR HIGHER *CHILLS OR SWEATING *NAUSEA AND VOMITING THAT IS NOT CONTROLLED WITH YOUR NAUSEA MEDICATION *UNUSUAL SHORTNESS OF BREATH *UNUSUAL BRUISING OR BLEEDING *URINARY PROBLEMS (pain or burning when urinating, or frequent urination) *BOWEL PROBLEMS (unusual diarrhea, constipation, pain near the anus) TENDERNESS IN MOUTH AND THROAT WITH OR WITHOUT PRESENCE OF ULCERS (sore throat, sores in mouth, or a toothache) UNUSUAL RASH, SWELLING OR PAIN  UNUSUAL VAGINAL DISCHARGE OR ITCHING   Items with * indicate a potential emergency and should be followed up as soon as possible or go to the Emergency Department if any problems should occur.  Please show the CHEMOTHERAPY ALERT CARD or IMMUNOTHERAPY ALERT CARD at  check-in to the Emergency Department and triage nurse.  Should you have questions after your visit or need to cancel or reschedule your appointment, please contact [facility]  Dept: [phone]  and follow the prompts.  Office hours are 8:00 a.m. to 4:30 p.m. Monday - Friday. Please note that voicemails left after 4:00 p.m. may not be returned until the following business day.  We are closed weekends and major holidays. You have access to a nurse at all times for urgent questions. Please call the main number to the clinic Dept: [phone] and follow the prompts.   For any non-urgent questions, you may also contact your provider using MyChart. We now offer e-Visits for anyone 14 and older to request care online for non-urgent symptoms. For details visit [email or web address].   Also download the MyChart app! Go to the app store, search "MyChart", open the app, select , and log in with your MyChart username and password.  Bevacizumab Injection What is this medication? BEVACIZUMAB (be va SIZ yoo mab) treats some types of cancer. It works by blocking a protein that causes cancer cells to grow and multiply. This helps to slow or stop the spread of cancer cells. It is a monoclonal antibody. This medicine may be used for other purposes; ask your health care provider or pharmacist if you have questions. COMMON BRAND NAME(S): Alymsys, Avastin, MVASI, Omer Jack What should I tell my care team before I take this medication? They need to know if you have any of these conditions: Blood clots Coughing up blood Having or recent surgery Heart failure High blood  pressure History of a connection between 2 or more body parts that do not usually connect (fistula) History of a tear in your stomach or intestines Protein in your urine An unusual or allergic reaction to bevacizumab, other medications, foods, dyes, or preservatives Pregnant or trying to get  pregnant Breast-feeding How should I use this medication? This medication is injected into a vein. It is given by your care team in a hospital or clinic setting. Talk to your care team the use of this medication in children. Special care may be needed. Overdosage: If you think you have taken too much of this medicine contact a poison control center or emergency room at once. NOTE: This medicine is only for you. Do not share this medicine with others. What if I miss a dose? Keep appointments for follow-up doses. It is important not to miss your dose. Call your care team if you are unable to keep an appointment. What may interact with this medication? Interactions are not expected. This list may not describe all possible interactions. Give your health care provider a list of all the medicines, herbs, non-prescription drugs, or dietary supplements you use. Also tell them if you smoke, drink alcohol, or use illegal drugs. Some items may interact with your medicine. What should I watch for while using this medication? Your condition will be monitored carefully while you are receiving this medication. You may need blood work while taking this medication. This medication may make you feel generally unwell. This is not uncommon as chemotherapy can affect healthy cells as well as cancer cells. Report any side effects. Continue your course of treatment even though you feel ill unless your care team tells you to stop. This medication may increase your risk to bruise or bleed. Call your care team if you notice any unusual bleeding. Before having surgery, talk to your care team to make sure it is ok. This medication can increase the risk of poor healing of your surgical site or wound. You will need to stop this medication for 28 days before surgery. After surgery, wait at least 28 days before restarting this medication. Make sure the surgical site or wound is healed enough before restarting this medication. Talk  to your care team if questions. Talk to your care team if you may be pregnant. Serious birth defects can occur if you take this medication during pregnancy and for 6 months after the last dose. Contraception is recommended while taking this medication and for 6 months after the last dose. Your care team can help you find the option that works for you. Do not breastfeed while taking this medication and for 6 months after the last dose. This medication can cause infertility. Talk to your care team if you are concerned about your fertility. What side effects may I notice from receiving this medication? Side effects that you should report to your care team as soon as possible: Allergic reactions--skin rash, itching, hives, swelling of the face, lips, tongue, or throat Bleeding--bloody or black, tar-like stools, vomiting blood or brown material that looks like coffee grounds, red or dark brown urine, small red or purple spots on skin, unusual bruising or bleeding Blood clot--pain, swelling, or warmth in the leg, shortness of breath, chest pain Heart attack--pain or tightness in the chest, shoulders, arms, or jaw, nausea, shortness of breath, cold or clammy skin, feeling faint or lightheaded Heart failure--shortness of breath, swelling of the ankles, feet, or hands, sudden weight gain, unusual weakness or fatigue Increase in  blood pressure Infection--fever, chills, cough, sore throat, wounds that don't heal, pain or trouble when passing urine, general feeling of discomfort or being unwell Infusion reactions--chest pain, shortness of breath or trouble breathing, feeling faint or lightheaded Kidney injury--decrease in the amount of urine, swelling of the ankles, hands, or feet Stomach pain that is severe, does not go away, or gets worse Stroke--sudden numbness or weakness of the face, arm, or leg, trouble speaking, confusion, trouble walking, loss of balance or coordination, dizziness, severe headache,  change in vision Sudden and severe headache, confusion, change in vision, seizures, which may be signs of posterior reversible encephalopathy syndrome (PRES) Side effects that usually do not require medical attention (report to your care team if they continue or are bothersome): Back pain Change in taste Diarrhea Dry skin Increased tears Nosebleed This list may not describe all possible side effects. Call your doctor for medical advice about side effects. You may report side effects to FDA at 1-800-FDA-1088. Where should I keep my medication? This medication is given in a hospital or clinic. It will not be stored at home. NOTE: This sheet is a summary. It may not cover all possible information. If you have questions about this medicine, talk to your doctor, pharmacist, or health care provider.  2024 [employer]/Gold Standard (2021-05-31 00:00:00)

## 2022-09-30 DIAGNOSIS — Z515 Encounter for palliative care: Secondary | ICD-10-CM | POA: Diagnosis not present

## 2022-10-02 ENCOUNTER — Inpatient Hospital Stay: Payer: PPO | Attending: Physician Assistant

## 2022-10-02 DIAGNOSIS — Z5112 Encounter for antineoplastic immunotherapy: Secondary | ICD-10-CM | POA: Insufficient documentation

## 2022-10-02 DIAGNOSIS — D649 Anemia, unspecified: Secondary | ICD-10-CM | POA: Diagnosis not present

## 2022-10-02 DIAGNOSIS — D500 Iron deficiency anemia secondary to blood loss (chronic): Secondary | ICD-10-CM | POA: Insufficient documentation

## 2022-10-02 DIAGNOSIS — C241 Malignant neoplasm of ampulla of Vater: Secondary | ICD-10-CM | POA: Insufficient documentation

## 2022-10-02 DIAGNOSIS — Z79899 Other long term (current) drug therapy: Secondary | ICD-10-CM | POA: Diagnosis not present

## 2022-10-02 DIAGNOSIS — D519 Vitamin B12 deficiency anemia, unspecified: Secondary | ICD-10-CM | POA: Diagnosis present

## 2022-10-02 DIAGNOSIS — I10 Essential (primary) hypertension: Secondary | ICD-10-CM | POA: Diagnosis not present

## 2022-10-02 DIAGNOSIS — K922 Gastrointestinal hemorrhage, unspecified: Secondary | ICD-10-CM | POA: Insufficient documentation

## 2022-10-02 DIAGNOSIS — E039 Hypothyroidism, unspecified: Secondary | ICD-10-CM | POA: Diagnosis not present

## 2022-10-02 LAB — CBC WITH DIFFERENTIAL/PLATELET
Abs Immature Granulocytes: 0.03 10*3/uL (ref 0.00–0.07)
Basophils Absolute: 0 10*3/uL (ref 0.0–0.1)
Basophils Relative: 1 %
Eosinophils Absolute: 0.1 10*3/uL (ref 0.0–0.5)
Eosinophils Relative: 3 %
HCT: 31.9 % — ABNORMAL LOW (ref 39.0–52.0)
Hemoglobin: 10.4 g/dL — ABNORMAL LOW (ref 13.0–17.0)
Immature Granulocytes: 1 %
Lymphocytes Relative: 26 %
Lymphs Abs: 1.1 10*3/uL (ref 0.7–4.0)
MCH: 33.8 pg (ref 26.0–34.0)
MCHC: 32.6 g/dL (ref 30.0–36.0)
MCV: 103.6 fL — ABNORMAL HIGH (ref 80.0–100.0)
Monocytes Absolute: 0.5 10*3/uL (ref 0.1–1.0)
Monocytes Relative: 11 %
Neutro Abs: 2.5 10*3/uL (ref 1.7–7.7)
Neutrophils Relative %: 58 %
Platelets: 213 10*3/uL (ref 150–400)
RBC: 3.08 MIL/uL — ABNORMAL LOW (ref 4.22–5.81)
RDW: 23.7 % — ABNORMAL HIGH (ref 11.5–15.5)
WBC: 4.2 10*3/uL (ref 4.0–10.5)
nRBC: 0 % (ref 0.0–0.2)

## 2022-10-02 LAB — COMPREHENSIVE METABOLIC PANEL
ALT: 33 U/L (ref 0–44)
AST: 40 U/L (ref 15–41)
Albumin: 3.5 g/dL (ref 3.5–5.0)
Alkaline Phosphatase: 128 U/L — ABNORMAL HIGH (ref 38–126)
Anion gap: 7 (ref 5–15)
BUN: 43 mg/dL — ABNORMAL HIGH (ref 8–23)
CO2: 28 mmol/L (ref 22–32)
Calcium: 10 mg/dL (ref 8.9–10.3)
Chloride: 106 mmol/L (ref 98–111)
Creatinine, Ser: 1.67 mg/dL — ABNORMAL HIGH (ref 0.61–1.24)
GFR, Estimated: 39 mL/min — ABNORMAL LOW (ref >60)
Glucose, Bld: 110 mg/dL — ABNORMAL HIGH (ref 70–99)
Potassium: 5 mmol/L (ref 3.5–5.1)
Sodium: 141 mmol/L (ref 135–145)
Total Bilirubin: 0.4 mg/dL (ref 0.3–1.2)
Total Protein: 7 g/dL (ref 6.5–8.1)

## 2022-10-02 LAB — SAMPLE TO BLOOD BANK

## 2022-10-03 LAB — FERRITIN: Ferritin: 434 ng/mL — ABNORMAL HIGH (ref 24–336)

## 2022-10-04 LAB — CANCER ANTIGEN 19-9: CA 19-9: 60 U/mL — ABNORMAL HIGH (ref 0–35)

## 2022-10-09 ENCOUNTER — Inpatient Hospital Stay (HOSPITAL_BASED_OUTPATIENT_CLINIC_OR_DEPARTMENT_OTHER): Payer: PPO | Admitting: Hematology

## 2022-10-09 ENCOUNTER — Inpatient Hospital Stay: Payer: PPO

## 2022-10-09 ENCOUNTER — Encounter: Payer: Self-pay | Admitting: Hematology

## 2022-10-09 VITALS — BP 105/55

## 2022-10-09 VITALS — BP 123/69 | HR 78 | Temp 97.8°F | Resp 17 | Wt 177.7 lb

## 2022-10-09 DIAGNOSIS — D500 Iron deficiency anemia secondary to blood loss (chronic): Secondary | ICD-10-CM

## 2022-10-09 DIAGNOSIS — C241 Malignant neoplasm of ampulla of Vater: Secondary | ICD-10-CM

## 2022-10-09 DIAGNOSIS — D649 Anemia, unspecified: Secondary | ICD-10-CM

## 2022-10-09 DIAGNOSIS — K5521 Angiodysplasia of colon with hemorrhage: Secondary | ICD-10-CM

## 2022-10-09 DIAGNOSIS — Z5112 Encounter for antineoplastic immunotherapy: Secondary | ICD-10-CM | POA: Diagnosis not present

## 2022-10-09 LAB — FERRITIN: Ferritin: 331 ng/mL (ref 24–336)

## 2022-10-09 LAB — SAMPLE TO BLOOD BANK

## 2022-10-09 LAB — COMPREHENSIVE METABOLIC PANEL
ALT: 41 U/L (ref 0–44)
AST: 45 U/L — ABNORMAL HIGH (ref 15–41)
Albumin: 3.3 g/dL — ABNORMAL LOW (ref 3.5–5.0)
Alkaline Phosphatase: 118 U/L (ref 38–126)
Anion gap: 7 (ref 5–15)
BUN: 39 mg/dL — ABNORMAL HIGH (ref 8–23)
CO2: 27 mmol/L (ref 22–32)
Calcium: 9.6 mg/dL (ref 8.9–10.3)
Chloride: 106 mmol/L (ref 98–111)
Creatinine, Ser: 1.54 mg/dL — ABNORMAL HIGH (ref 0.61–1.24)
GFR, Estimated: 43 mL/min — ABNORMAL LOW (ref >60)
Glucose, Bld: 90 mg/dL (ref 70–99)
Potassium: 4 mmol/L (ref 3.5–5.1)
Sodium: 140 mmol/L (ref 135–145)
Total Bilirubin: 0.5 mg/dL (ref 0.3–1.2)
Total Protein: 6.7 g/dL (ref 6.5–8.1)

## 2022-10-09 LAB — CBC WITH DIFFERENTIAL (CANCER CENTER ONLY)
Abs Immature Granulocytes: 0.01 10*3/uL (ref 0.00–0.07)
Basophils Absolute: 0 10*3/uL (ref 0.0–0.1)
Basophils Relative: 1 %
Eosinophils Absolute: 0.1 10*3/uL (ref 0.0–0.5)
Eosinophils Relative: 3 %
HCT: 30.8 % — ABNORMAL LOW (ref 39.0–52.0)
Hemoglobin: 10.1 g/dL — ABNORMAL LOW (ref 13.0–17.0)
Immature Granulocytes: 0 %
Lymphocytes Relative: 21 %
Lymphs Abs: 0.9 10*3/uL (ref 0.7–4.0)
MCH: 34.1 pg — ABNORMAL HIGH (ref 26.0–34.0)
MCHC: 32.8 g/dL (ref 30.0–36.0)
MCV: 104.1 fL — ABNORMAL HIGH (ref 80.0–100.0)
Monocytes Absolute: 0.5 10*3/uL (ref 0.1–1.0)
Monocytes Relative: 11 %
Neutro Abs: 2.9 10*3/uL (ref 1.7–7.7)
Neutrophils Relative %: 64 %
Platelet Count: 154 10*3/uL (ref 150–400)
RBC: 2.96 MIL/uL — ABNORMAL LOW (ref 4.22–5.81)
RDW: 22.4 % — ABNORMAL HIGH (ref 11.5–15.5)
WBC Count: 4.5 10*3/uL (ref 4.0–10.5)
nRBC: 0 % (ref 0.0–0.2)

## 2022-10-09 LAB — TOTAL PROTEIN, URINE DIPSTICK: Protein, ur: NEGATIVE mg/dL

## 2022-10-09 MED ORDER — HEPARIN SOD (PORK) LOCK FLUSH 100 UNIT/ML IV SOLN: 500.0000 [IU] | Freq: Once | INTRAVENOUS | Status: DC | PRN

## 2022-10-09 MED ORDER — SODIUM CHLORIDE 0.9 % IV SOLN: Freq: Once | INTRAVENOUS | Status: AC

## 2022-10-09 MED ORDER — SODIUM CHLORIDE 0.9% FLUSH: 10.0000 mL | INTRAVENOUS | Status: DC | PRN

## 2022-10-09 MED ORDER — SODIUM CHLORIDE 0.9 % IV SOLN
5.0000 mg/kg | Freq: Once | INTRAVENOUS | Status: AC
  Administered 2022-10-09: 400 mg via INTRAVENOUS
  Filled 2022-10-09: qty 16

## 2022-10-09 NOTE — Progress Notes (Signed)
[facility]   [phone] Fax:([phone]   Clinic Follow up Note   Patient Care Team: [name], MD as PCP - General (Internal Medicine) [name], MD as Consulting Physician (General Surgery) [name], MD as Consulting Physician (Gastroenterology) [name], MD as Consulting Physician (Otolaryngology) [name], MD as Consulting Physician (Cardiology) Suzi Roots as Physician Assistant (Dermatology) Malachy Mood, MD as Consulting Physician (Oncology)  Date of Service:  10/09/2022  CHIEF COMPLAINT: f/u of ampullary cancer, acute on chronic anemia, recurrent GIB   CURRENT THERAPY:  Bevacizumab q14d   ASSESSMENT:  Carl Woods is a 89 y.o. 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  Foundation One showed no targetable mutations. -He is finishing RT on 04/09/2022 -Due to advanced age, I do not plan to offer him chemotherapy -CT scan in June 2024 showed no evidence of residual disease or new metastasis, although CT is not ideal to evaluate the residual disease in his case.        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  -He has required multiple blood transfusions -We discussed the benefit and side effect of bevacizumab for AVM related GI bleeding, he started on 09/11/22, plan to give every 2 weeks for total of 4 cycles. -He is clinically responding to the treatment well, has not had any GI bleeding's, anemia overall improved and is stable and has not required a blood transfusion.   PLAN: Lab reviewed-hg  10.1 -CMP-pending -Ferritin -pending -proceed with Vegzelma today -lab and f/u  and last treatment 9/26    SUMMARY OF ONCOLOGIC HISTORY: Oncology History  Cancer of ampulla of Vater (HCC)  12/06/2021 Imaging   CT ABDOMEN PELVIS W CONTRAST   IMPRESSION: 1. There is moderate intrahepatic bile duct dilatation with marked fusiform dilatation of the common bile duct. No CT visible common bile duct stones identified. Differential considerations include a obstructing distal common bile duct stone (not visible by CT), distal CBD stricture, or mass at the level of the ampullary. Consider further evaluation with contrast enhanced MRI/MRCP or ERCP. 2. Contour the liver appears nodular which may reflect underlying cirrhosis. 3. Age-indeterminate compression fracture involving L1 vertebral body with loss of 50% of the vertebral body height. No signs of retropulsion of fracture fragments into the canal. 4. Fat containing umbilical hernia. 5. 5 mm anterolisthesis of L4 on L5. 6.  Aortic Atherosclerosis (ICD10-I70.0).   12/16/2021 Imaging   MR ABDOMEN MRCP W WO CONTAST   IMPRESSION: 1. Moderate intrahepatic and extrahepatic bile duct dilation with smooth tapering to the level of the ampulla without filling defect or focal mass lesion identified. Findings may reflect ampullary stricture or occult ampullary mass. Consider further evaluation with ERCP. 2. Mild dilated main pancreatic duct at the head measuring up to 8 mm. 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 Recommend follow-up pre and post-contrast MRI/MRCP in 2 years. This recommendation follows ACR consensus guidelines: Management of Incidental Pancreatic Cysts: A White Paper of the ACR Incidental Findings Committee. J Am Coll  Radiol 2017;14:911-923. 3.  Aortic Atherosclerosis (ICD10-I70.0).   01/23/2022 Procedure   DG ERCP    IMPRESSION: Dilatation of the extrahepatic bile duct with severe tapering or narrowing in the distal common bile duct. Placement of biliary stent.   These images were submitted for radiologic interpretation only. Please see the procedural report for the amount of contrast.    01/23/2022 Procedure   EUS/ERCP  ENDOSONOGRAPHIC FINDING: There was dilation in the common bile duct (12.3 mm -> 20.4 mm) and in the common hepatic duct (23.0 mm). A small amount of hyperechoic material consistent with sludge was visualized endosonographically in the common bile duct. Moderate hyperechoic material consistent with sludge was visualized endosonographically in the gallbladder with normal gallbladder wall thickness. Pancreatic parenchymal abnormalities were noted in the entire pancreas. These consisted of hyperechoic foci with shadowing and cysts. There was a 9.1 mm by 6.1 mm cyst in the neck of the pancreas. There was a 15.3 mm by 14.1 mm cyst in the tail of the pancreas. The pancreatic duct had a dilated endosonographic appearance, had a prominently branched endosonographic appearance and had hyperechoic walls in the pancreatic head (PD - 8.2 mm -> 4.3 mm), genu of the pancreas (3.5 mm), body of the pancreas (3.2 mm) and tail of the pancreas (2.8 mm). A hypoechoic irregular lesion was identified endosonographically at the ampulla. The lesion measured 15 mm by 13 mm in maximal cross-sectional diameter. The lesion extended from the mucosa to the submucosa. The outer margins were irregular. This is noted where CBD and PD dilation is noted to occur. Endosonographic imaging in the visualized portion of the liver showed no mass.  No malignant-appearing lymph nodes were visualized in the celiac region (level 20), peripancreatic region and porta hepatis region. The celiac region was visualized.   01/23/2022 Pathology Results   SURGICAL PATHOLOGY  CASE: [id number]  PATIENT: Charlesetta Ivory   Surgical Pathology Report   FINAL MICROSCOPIC DIAGNOSIS:   A. STOMACH, RANDOM, BIOPSY:  - Gastric mucosa, no significant abnormality.  No inflammation,  intestinal metaplasia, dysplasia or malignancy.   B. AMPULLARY LESION, BIOPSY:  - Adenocarcinoma.  See comment.    01/31/2022 Initial Diagnosis   Cancer of ampulla of Vater (HCC)   02/07/2022 Imaging    IMPRESSION: Interval increase in size of lymph node in the anterior cardiophrenic space on the right.   Increase small right pleural effusion compared to the previous MRI.   Subtle nodular tissue along the margin of the right hepatic lobe in the upper abdomen posteriorly. Recommend additional workup when appropriate.   Calcified pleural plaques.   Evidence of chronic liver disease.   Aortic Atherosclerosis (ICD10-I70.0).   02/14/2022 Miscellaneous   Foundation One:  Biomarker Findings Microsatellite status-Cannot be determined Tumor Mutation Burden-Cannot be determined  Genomics Findings EPHB4 amplificatio SF3B1 Z610R TP53 R273H       INTERVAL HISTORY:  Carl Woods is here for a follow up of ampullary cancer, acute on chronic anemia, recurrent GIB . He was last seen by me on 09/25/2022. He presents to the clinic alone, but daughter is on speaker phone.Pt state he is doing well at [facility].pt reports that he participates in PT. Pt denies have dark stool. Pt state its hard to tell whether he notice a difference.       All other systems were reviewed with the patient and are negative.  MEDICAL HISTORY:  Past Medical History:  Diagnosis Date   Anal fistula    Arthritis  Fingers and hands   Atrial fibrillation (HCC)    AVM (arteriovenous malformation)    Clipped during Colonoscopy 05/2016   Barrett's esophagus    Bilateral cataracts    BPH (benign prostatic hyperplasia)    Cecal angiodysplasia 05/28/2016   ablated at colonoscopy   Deviated nasal septum    Diverticulosis of sigmoid colon     E. 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   Left Forehead (treatment after biopsy)   OSA on CPAP    Perianal rash    Recurrent epistaxis    Renal cyst 01/04/2013   Small left peripelvic renal cysts , noted on US Renal   SCCA (squamous cell carcinoma) of skin 10/17/2015   Left Sup Bridge of Nose (curet, cautery and 5FU)   Seasonal allergies    Superficial basal cell carcinoma (BCC) 10/17/2015   Left Bulb of Nose (curet, cautery and 5FU)   Tubular adenoma     SURGICAL HISTORY: Past Surgical History:  Procedure Laterality Date   BILIARY STENT PLACEMENT N/A 01/23/2022   Procedure: BILIARY STENT PLACEMENT;  Surgeon: Lemar Lofty., MD;  Location: [facility];  Service: Gastroenterology;  Laterality: N/A;   BILIARY STENT PLACEMENT N/A 08/06/2022   Procedure: BILIARY STENT PLACEMENT;  Surgeon: Lynann Bologna, MD;  Location: [facility];  Service: Gastroenterology;  Laterality: N/A;   BIOPSY  01/23/2022   Procedure: BIOPSY;  Surgeon: Meridee Score Netty Starring., MD;  Location: [facility];  Service: Gastroenterology;;   BIOPSY  07/09/2022   Procedure: BIOPSY;  Surgeon: Shellia Cleverly, DO;  Location: [facility];  Service: Gastroenterology;;   BIOPSY  08/02/2022   Procedure: BIOPSY;  Surgeon: Sherrilyn Rist, MD;  Location: [facility];  Service: Gastroenterology;;   COLONOSCOPY  multiple   CYSTOSCOPY     ENDOSCOPIC RETROGRADE CHOLANGIOPANCREATOGRAPHY (ERCP) WITH PROPOFOL N/A 01/23/2022   Procedure: ENDOSCOPIC RETROGRADE CHOLANGIOPANCREATOGRAPHY (ERCP) WITH PROPOFOL;  Surgeon: Lemar Lofty., MD;  Location: [facility];  Service: Gastroenterology;  Laterality: N/A;   ENTEROSCOPY N/A 08/02/2022   Procedure: ENTEROSCOPY;  Surgeon: Sherrilyn Rist, MD;  Location: [facility];  Service: Gastroenterology;  Laterality:  N/A;   ERCP N/A 08/06/2022   Procedure: ENDOSCOPIC RETROGRADE CHOLANGIOPANCREATOGRAPHY (ERCP);  Surgeon: Lynann Bologna, MD;  Location: [facility];  Service: Gastroenterology;  Laterality: N/A;   ESOPHAGOGASTRODUODENOSCOPY  multiple   ESOPHAGOGASTRODUODENOSCOPY N/A 01/23/2022   Procedure: ESOPHAGOGASTRODUODENOSCOPY (EGD);  Surgeon: Lemar Lofty., MD;  Location: [facility];  Service: Gastroenterology;  Laterality: N/A;   ESOPHAGOGASTRODUODENOSCOPY N/A 07/09/2022   Procedure: ESOPHAGOGASTRODUODENOSCOPY (EGD);  Surgeon: Shellia Cleverly, DO;  Location: [facility];  Service: Gastroenterology;  Laterality: N/A;   ESOPHAGOGASTRODUODENOSCOPY N/A 08/21/2022   Procedure: ESOPHAGOGASTRODUODENOSCOPY (EGD);  Surgeon: Jenel Lucks, MD;  Location: [facility] END[facility]COPY;  Service: Gastroenterology;  Laterality: N/A;   EUS N/A 01/23/2022   Procedure: UPPER ENDOSCOPIC ULTRASOUND (EUS) RADIAL;  Surgeon: Lemar Lofty., MD;  Location: [facility];  Service: Gastroenterology;  Laterality: N/A;   EVALUATION UNDER ANESTHESIA WITH FISTULECTOMY N/A 03/02/2018   Procedure: ANORECTAL EXAM UNDER ANESTHESIA WITH REPAIR OF SUPERFICIAL PERIRECTAL FISTULA AND HEMORRHOIDECTOMY;  Surgeon: [name], MD;  Location: [facility];  Service: General;  Laterality: N/A;   EXPLORATORY LAPAROTOMY     HEMOSTASIS CLIP PLACEMENT  07/09/2022   Procedure: HEMOSTASIS CLIP PLACEMENT;  Surgeon:  Cirigliano, Verlin Dike, DO;  Location: [facility];  Service: Gastroenterology;;   HOT HEMOSTASIS N/A 08/02/2022   Procedure: HOT HEMOSTASIS (ARGON PLASMA COAGULATION/BICAP);  Surgeon: Sherrilyn Rist, MD;  Location: [facility];  Service: Gastroenterology;  Laterality: N/A;   HOT HEMOSTASIS N/A 08/21/2022   Procedure: HOT HEMOSTASIS (ARGON PLASMA COAGULATION/BICAP);  Surgeon: Jenel Lucks, MD;  Location: [facility];  Service: Gastroenterology;  Laterality: N/A;   IR THORACENTESIS ASP PLEURAL SPACE W/IMG GUIDE  07/10/2022    REMOVAL OF STONES  01/23/2022   Procedure: REMOVAL OF STONES;  Surgeon: Meridee Score Netty Starring., MD;  Location: [facility];  Service: Gastroenterology;;   REMOVAL OF STONES  08/06/2022   Procedure: REMOVAL OF STONES;  Surgeon: Lynann Bologna, MD;  Location: [facility];  Service: Gastroenterology;;   Dennison Mascot  01/23/2022   Procedure: Dennison Mascot;  Surgeon: Mansouraty, Netty Starring., MD;  Location: [facility];  Service: Gastroenterology;;    I have reviewed the social history and family history with the patient and they are unchanged from previous note.  ALLERGIES:  is allergic to nsaids.  MEDICATIONS:  Current Outpatient Medications  Medication Sig Dispense Refill   bumetanide (BUMEX) 1 MG tablet Take 1 tablet (1 mg total) by mouth daily.     Cholecalciferol (VITAMIN D3) 5000 units CAPS Take 5,000 Units by mouth every morning.     dapagliflozin propanediol (FARXIGA) 5 MG TABS tablet Take 1 tablet (5 mg total) by mouth in the morning.     ferrous sulfate 325 (65 FE) MG tablet Take 325 mg by mouth every Monday, Wednesday, and Friday.     Glucosamine Sulfate 500 MG TABS Take 500 mg by mouth daily.     levothyroxine (SYNTHROID) 150 MCG tablet Take 150 mcg by mouth daily before breakfast.     nitroGLYCERIN (NITROSTAT) 0.4 MG SL tablet Place 1 tablet (0.4 mg total) under the tongue every 5 (five) minutes as needed for up to 25 days for chest pain. 25 tablet 3   olopatadine (PATANOL) 0.1 % ophthalmic solution Place 1 drop into both eyes daily as needed for allergies.     ondansetron (ZOFRAN) 8 MG tablet Take 1 tablet (8 mg total) by mouth every 8 (eight) hours as needed for nausea or vomiting. 20 tablet 0   pantoprazole (PROTONIX) 40 MG tablet Take 1 tablet (40 mg total) by mouth 2 (two) times daily.     potassium chloride SA (KLOR-CON M) 20 MEQ tablet Take 1 tablet (20 mEq total) by mouth 2 (two) times daily. 60 tablet 0   sodium chloride (OCEAN) 0.65 % SOLN nasal spray Place 1 spray  into both nostrils as needed for congestion.     sucralfate (CARAFATE) 1 GM/10ML suspension Take 10 mLs (1 g total) by mouth 4 (four) times daily -  with meals and at bedtime. 420 mL 0   No current facility-administered medications for this visit.   Facility-Administered Medications Ordered in Other Visits  Medication Dose Route Frequency Provider Last Rate Last Admin   bevacizumab-adcd (VEGZELMA) 400 mg in sodium chloride 0.9 % 100 mL chemo infusion  5 mg/kg (Treatment Plan Recorded) Intravenous Once Malachy Mood, MD 696 mL/hr at 10/09/22 0939 400 mg at 10/09/22 0939   heparin lock flush 100 unit/mL  500 Units Intracatheter Once PRN Malachy Mood, MD       sodium chloride flush (NS) 0.9 % injection 10 mL  10 mL Intracatheter PRN Malachy Mood, MD        PHYSICAL EXAMINATION: ECOG  PERFORMANCE STATUS: 3 - Symptomatic, >50% confined to bed  Vitals:   10/09/22 0814  BP: 123/69  Pulse: 78  Resp: 17  Temp: 97.8 F (36.6 C)  SpO2: 100%   Wt Readings from Last 3 Encounters:  10/09/22 177 lb 11.2 oz (80.6 kg)  09/22/22 177 lb 3.2 oz (80.4 kg)  09/11/22 177 lb 3.2 oz (80.4 kg)     GENERAL:alert, no distress and comfortable SKIN: skin color normal, no rashes or significant lesions EYES: normal, Conjunctiva are pink and non-injected, sclera clear  NEURO: alert & oriented x 3 with fluent speech  LABORATORY DATA:  I have reviewed the data as listed    Latest Ref Rng & Units 10/09/2022    7:36 AM 10/02/2022    2:43 PM 09/25/2022    8:50 AM  CBC  WBC 4.0 - 10.5 K/uL 4.5  4.2  4.1   Hemoglobin 13.0 - 17.0 g/dL 16.1  09.6  9.9   Hematocrit 39.0 - 52.0 % 30.8  31.9  30.8   Platelets 150 - 400 K/uL 154  213  155         Latest Ref Rng & Units 10/09/2022    7:36 AM 10/02/2022    2:43 PM 09/25/2022    8:50 AM  CMP  Glucose 70 - 99 mg/dL 90  045  409   BUN 8 - 23 mg/dL 39  43  47   Creatinine 0.61 - 1.24 mg/dL 8.11  9.14  7.82   Sodium 135 - 145 mmol/L 140  141  140   Potassium 3.5 - 5.1 mmol/L  4.0  5.0  4.3   Chloride 98 - 111 mmol/L 106  106  106   CO2 22 - 32 mmol/L 27  28  26    Calcium 8.9 - 10.3 mg/dL 9.6  95.6  9.5   Total Protein 6.5 - 8.1 g/dL 6.7  7.0  7.0   Total Bilirubin 0.3 - 1.2 mg/dL 0.5  0.4  0.4   Alkaline Phos 38 - 126 U/L 118  128  126   AST 15 - 41 U/L 45  40  40   ALT 0 - 44 U/L 41  33  35       RADIOGRAPHIC STUDIES: I have personally reviewed the radiological images as listed and agreed with the findings in the report. No results found.    No orders of the defined types were placed in this encounter.  All questions were answered. The patient knows to call the clinic with any problems, questions or concerns. No barriers to learning was detected. The total time spent in the appointment was 25 minutes.     Malachy Mood, MD 10/09/2022   Carolin Coy, CMA, am acting as scribe for Malachy Mood, MD.   I have reviewed the above documentation for accuracy and completeness, and I agree with the above.

## 2022-10-09 NOTE — Patient Instructions (Signed)
[facility]  Discharge Instructions: Thank you for choosing [facility] to provide your oncology and hematology care.   If you have a lab appointment with the Cancer Center, please go directly to the Cancer Center and check in at the registration area.   Wear comfortable clothing and clothing appropriate for easy access to any Portacath or PICC line.   We strive to give you quality time with your provider. You may need to reschedule your appointment if you arrive late (15 or more minutes).  Arriving late affects you and other patients whose appointments are after yours.  Also, if you miss three or more appointments without notifying the office, you may be dismissed from the clinic at the provider's discretion.      For prescription refill requests, have your pharmacy contact our office and allow 72 hours for refills to be completed.    Today you received the following chemotherapy and/or immunotherapy agents: Vegzelma      To help prevent nausea and vomiting after your treatment, we encourage you to take your nausea medication as directed.  BELOW ARE SYMPTOMS THAT SHOULD BE REPORTED IMMEDIATELY: *FEVER GREATER THAN 100.4 F (38 C) OR HIGHER *CHILLS OR SWEATING *NAUSEA AND VOMITING THAT IS NOT CONTROLLED WITH YOUR NAUSEA MEDICATION *UNUSUAL SHORTNESS OF BREATH *UNUSUAL BRUISING OR BLEEDING *URINARY PROBLEMS (pain or burning when urinating, or frequent urination) *BOWEL PROBLEMS (unusual diarrhea, constipation, pain near the anus) TENDERNESS IN MOUTH AND THROAT WITH OR WITHOUT PRESENCE OF ULCERS (sore throat, sores in mouth, or a toothache) UNUSUAL RASH, SWELLING OR PAIN  UNUSUAL VAGINAL DISCHARGE OR ITCHING   Items with * indicate a potential emergency and should be followed up as soon as possible or go to the Emergency Department if any problems should occur.  Please show the CHEMOTHERAPY ALERT CARD or IMMUNOTHERAPY ALERT CARD at  check-in to the Emergency Department and triage nurse.  Should you have questions after your visit or need to cancel or reschedule your appointment, please contact [facility]  Dept: [phone]  and follow the prompts.  Office hours are 8:00 a.m. to 4:30 p.m. Monday - Friday. Please note that voicemails left after 4:00 p.m. may not be returned until the following business day.  We are closed weekends and major holidays. You have access to a nurse at all times for urgent questions. Please call the main number to the clinic Dept: [phone] and follow the prompts.   For any non-urgent questions, you may also contact your provider using MyChart. We now offer e-Visits for anyone 18 and older to request care online for non-urgent symptoms. For details visit [email or web address].   Also download the MyChart app! Go to the app store, search "MyChart", open the app, select [facility], and log in with your MyChart username and password.   

## 2022-10-09 NOTE — Assessment & Plan Note (Signed)
-  cTxN0M0, diagnosed in 12/2021, MMR normal  -I reviewed PET scan findings, which showed no evidence of distant metastasis  --I reviewed his molecular testing, MMR was normal, he is not a candidate for immunotherapy.  Foundation One showed no targetable mutations. -He is finishing RT on 04/09/2022 -Due to advanced age, I do not plan to offer him chemotherapy -CT scan in June 2024 showed no evidence of residual disease or new metastasis, although CT is not ideal to evaluate the residual disease in his case.

## 2022-10-09 NOTE — Assessment & Plan Note (Signed)
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  -He has required multiple blood transfusions -We discussed the benefit and side effect of bevacizumab  for AVM related GI bleeding, he started on 09/11/22, plan to give every 2 weeks for total of 4 cycles.

## 2022-10-13 DIAGNOSIS — Z79899 Other long term (current) drug therapy: Secondary | ICD-10-CM | POA: Diagnosis not present

## 2022-10-23 ENCOUNTER — Inpatient Hospital Stay: Payer: PPO

## 2022-10-23 ENCOUNTER — Inpatient Hospital Stay: Payer: PPO | Admitting: Hematology

## 2022-10-23 VITALS — BP 115/72 | HR 94 | Resp 18

## 2022-10-23 VITALS — BP 116/72 | HR 89 | Temp 97.6°F | Resp 18 | Ht 68.0 in | Wt 173.4 lb

## 2022-10-23 DIAGNOSIS — K5521 Angiodysplasia of colon with hemorrhage: Secondary | ICD-10-CM

## 2022-10-23 DIAGNOSIS — Z5112 Encounter for antineoplastic immunotherapy: Secondary | ICD-10-CM | POA: Diagnosis not present

## 2022-10-23 DIAGNOSIS — D649 Anemia, unspecified: Secondary | ICD-10-CM

## 2022-10-23 DIAGNOSIS — D519 Vitamin B12 deficiency anemia, unspecified: Secondary | ICD-10-CM

## 2022-10-23 DIAGNOSIS — C241 Malignant neoplasm of ampulla of Vater: Secondary | ICD-10-CM

## 2022-10-23 DIAGNOSIS — D500 Iron deficiency anemia secondary to blood loss (chronic): Secondary | ICD-10-CM | POA: Diagnosis not present

## 2022-10-23 LAB — COMPREHENSIVE METABOLIC PANEL
ALT: 48 U/L — ABNORMAL HIGH (ref 0–44)
AST: 55 U/L — ABNORMAL HIGH (ref 15–41)
Albumin: 3.4 g/dL — ABNORMAL LOW (ref 3.5–5.0)
Alkaline Phosphatase: 153 U/L — ABNORMAL HIGH (ref 38–126)
Anion gap: 9 (ref 5–15)
BUN: 45 mg/dL — ABNORMAL HIGH (ref 8–23)
CO2: 24 mmol/L (ref 22–32)
Calcium: 9.8 mg/dL (ref 8.9–10.3)
Chloride: 106 mmol/L (ref 98–111)
Creatinine, Ser: 1.5 mg/dL — ABNORMAL HIGH (ref 0.61–1.24)
GFR, Estimated: 44 mL/min — ABNORMAL LOW (ref >60)
Glucose, Bld: 121 mg/dL — ABNORMAL HIGH (ref 70–99)
Potassium: 4.2 mmol/L (ref 3.5–5.1)
Sodium: 139 mmol/L (ref 135–145)
Total Bilirubin: 0.5 mg/dL (ref 0.3–1.2)
Total Protein: 7.3 g/dL (ref 6.5–8.1)

## 2022-10-23 LAB — CBC WITH DIFFERENTIAL/PLATELET
Abs Immature Granulocytes: 0.01 10*3/uL (ref 0.00–0.07)
Basophils Absolute: 0 10*3/uL (ref 0.0–0.1)
Basophils Relative: 1 %
Eosinophils Absolute: 0.1 10*3/uL (ref 0.0–0.5)
Eosinophils Relative: 2 %
HCT: 36.5 % — ABNORMAL LOW (ref 39.0–52.0)
Hemoglobin: 12.1 g/dL — ABNORMAL LOW (ref 13.0–17.0)
Immature Granulocytes: 0 %
Lymphocytes Relative: 23 %
Lymphs Abs: 0.9 10*3/uL (ref 0.7–4.0)
MCH: 34.8 pg — ABNORMAL HIGH (ref 26.0–34.0)
MCHC: 33.2 g/dL (ref 30.0–36.0)
MCV: 104.9 fL — ABNORMAL HIGH (ref 80.0–100.0)
Monocytes Absolute: 0.5 10*3/uL (ref 0.1–1.0)
Monocytes Relative: 13 %
Neutro Abs: 2.4 10*3/uL (ref 1.7–7.7)
Neutrophils Relative %: 61 %
Platelets: 190 10*3/uL (ref 150–400)
RBC: 3.48 MIL/uL — ABNORMAL LOW (ref 4.22–5.81)
RDW: 21.2 % — ABNORMAL HIGH (ref 11.5–15.5)
WBC: 3.9 10*3/uL — ABNORMAL LOW (ref 4.0–10.5)
nRBC: 0 % (ref 0.0–0.2)

## 2022-10-23 LAB — SAMPLE TO BLOOD BANK

## 2022-10-23 LAB — FERRITIN: Ferritin: 444 ng/mL — ABNORMAL HIGH (ref 24–336)

## 2022-10-23 MED ORDER — SODIUM CHLORIDE 0.9 % IV SOLN: Freq: Once | INTRAVENOUS | Status: AC

## 2022-10-23 MED ORDER — CYANOCOBALAMIN 1000 MCG/ML IJ SOLN
1000.0000 ug | Freq: Once | INTRAMUSCULAR | Status: AC
  Administered 2022-10-23: 1000 ug via INTRAMUSCULAR
  Filled 2022-10-23: qty 1

## 2022-10-23 MED ORDER — SODIUM CHLORIDE 0.9 % IV SOLN
5.0000 mg/kg | Freq: Once | INTRAVENOUS | Status: AC
  Administered 2022-10-23: 400 mg via INTRAVENOUS
  Filled 2022-10-23: qty 16

## 2022-10-23 NOTE — Patient Instructions (Signed)
[facility]  Discharge Instructions: Thank you for choosing [facility] to provide your oncology and hematology care.   If you have a lab appointment with the Cancer Center, please go directly to the Cancer Center and check in at the registration area.   Wear comfortable clothing and clothing appropriate for easy access to any Portacath or PICC line.   We strive to give you quality time with your provider. You may need to reschedule your appointment if you arrive late (15 or more minutes).  Arriving late affects you and other patients whose appointments are after yours.  Also, if you miss three or more appointments without notifying the office, you may be dismissed from the clinic at the provider's discretion.      For prescription refill requests, have your pharmacy contact our office and allow 72 hours for refills to be completed.    Today you received the following chemotherapy and/or immunotherapy agent: Bevacizumab   To help prevent nausea and vomiting after your treatment, we encourage you to take your nausea medication as directed.  BELOW ARE SYMPTOMS THAT SHOULD BE REPORTED IMMEDIATELY: *FEVER GREATER THAN 100.4 F (38 C) OR HIGHER *CHILLS OR SWEATING *NAUSEA AND VOMITING THAT IS NOT CONTROLLED WITH YOUR NAUSEA MEDICATION *UNUSUAL SHORTNESS OF BREATH *UNUSUAL BRUISING OR BLEEDING *URINARY PROBLEMS (pain or burning when urinating, or frequent urination) *BOWEL PROBLEMS (unusual diarrhea, constipation, pain near the anus) TENDERNESS IN MOUTH AND THROAT WITH OR WITHOUT PRESENCE OF ULCERS (sore throat, sores in mouth, or a toothache) UNUSUAL RASH, SWELLING OR PAIN  UNUSUAL VAGINAL DISCHARGE OR ITCHING   Items with * indicate a potential emergency and should be followed up as soon as possible or go to the Emergency Department if any problems should occur.  Please show the CHEMOTHERAPY ALERT CARD or IMMUNOTHERAPY ALERT CARD at  check-in to the Emergency Department and triage nurse.  Should you have questions after your visit or need to cancel or reschedule your appointment, please contact [facility]  Dept: [phone]  and follow the prompts.  Office hours are 8:00 a.m. to 4:30 p.m. Monday - Friday. Please note that voicemails left after 4:00 p.m. may not be returned until the following business day.  We are closed weekends and major holidays. You have access to a nurse at all times for urgent questions. Please call the main number to the clinic Dept: [phone] and follow the prompts.   For any non-urgent questions, you may also contact your provider using MyChart. We now offer e-Visits for anyone 44 and older to request care online for non-urgent symptoms. For details visit [email or web address].   Also download the MyChart app! Go to the app store, search "MyChart", open the app, select [facility], and log in with your MyChart username and password.  Bevacizumab Injection What is this medication? BEVACIZUMAB (be va SIZ yoo mab) treats some types of cancer. It works by blocking a protein that causes cancer cells to grow and multiply. This helps to slow or stop the spread of cancer cells. It is a monoclonal antibody. This medicine may be used for other purposes; ask your health care provider or pharmacist if you have questions. COMMON BRAND NAME(S): Alymsys, Avastin, MVASI, Omer Jack What should I tell my care team before I take this medication? They need to know if you have any of these conditions: Blood clots Coughing up blood Having or recent surgery Heart failure High blood  pressure History of a connection between 2 or more body parts that do not usually connect (fistula) History of a tear in your stomach or intestines Protein in your urine An unusual or allergic reaction to bevacizumab, other medications, foods, dyes, or preservatives Pregnant or trying to get  pregnant Breast-feeding How should I use this medication? This medication is injected into a vein. It is given by your care team in a hospital or clinic setting. Talk to your care team the use of this medication in children. Special care may be needed. Overdosage: If you think you have taken too much of this medicine contact a poison control center or emergency room at once. NOTE: This medicine is only for you. Do not share this medicine with others. What if I miss a dose? Keep appointments for follow-up doses. It is important not to miss your dose. Call your care team if you are unable to keep an appointment. What may interact with this medication? Interactions are not expected. This list may not describe all possible interactions. Give your health care provider a list of all the medicines, herbs, non-prescription drugs, or dietary supplements you use. Also tell them if you smoke, drink alcohol, or use illegal drugs. Some items may interact with your medicine. What should I watch for while using this medication? Your condition will be monitored carefully while you are receiving this medication. You may need blood work while taking this medication. This medication may make you feel generally unwell. This is not uncommon as chemotherapy can affect healthy cells as well as cancer cells. Report any side effects. Continue your course of treatment even though you feel ill unless your care team tells you to stop. This medication may increase your risk to bruise or bleed. Call your care team if you notice any unusual bleeding. Before having surgery, talk to your care team to make sure it is ok. This medication can increase the risk of poor healing of your surgical site or wound. You will need to stop this medication for 28 days before surgery. After surgery, wait at least 28 days before restarting this medication. Make sure the surgical site or wound is healed enough before restarting this medication. Talk  to your care team if questions. Talk to your care team if you may be pregnant. Serious birth defects can occur if you take this medication during pregnancy and for 6 months after the last dose. Contraception is recommended while taking this medication and for 6 months after the last dose. Your care team can help you find the option that works for you. Do not breastfeed while taking this medication and for 6 months after the last dose. This medication can cause infertility. Talk to your care team if you are concerned about your fertility. What side effects may I notice from receiving this medication? Side effects that you should report to your care team as soon as possible: Allergic reactions--skin rash, itching, hives, swelling of the face, lips, tongue, or throat Bleeding--bloody or black, tar-like stools, vomiting blood or brown material that looks like coffee grounds, red or dark brown urine, small red or purple spots on skin, unusual bruising or bleeding Blood clot--pain, swelling, or warmth in the leg, shortness of breath, chest pain Heart attack--pain or tightness in the chest, shoulders, arms, or jaw, nausea, shortness of breath, cold or clammy skin, feeling faint or lightheaded Heart failure--shortness of breath, swelling of the ankles, feet, or hands, sudden weight gain, unusual weakness or fatigue Increase in  blood pressure Infection--fever, chills, cough, sore throat, wounds that don't heal, pain or trouble when passing urine, general feeling of discomfort or being unwell Infusion reactions--chest pain, shortness of breath or trouble breathing, feeling faint or lightheaded Kidney injury--decrease in the amount of urine, swelling of the ankles, hands, or feet Stomach pain that is severe, does not go away, or gets worse Stroke--sudden numbness or weakness of the face, arm, or leg, trouble speaking, confusion, trouble walking, loss of balance or coordination, dizziness, severe headache,  change in vision Sudden and severe headache, confusion, change in vision, seizures, which may be signs of posterior reversible encephalopathy syndrome (PRES) Side effects that usually do not require medical attention (report to your care team if they continue or are bothersome): Back pain Change in taste Diarrhea Dry skin Increased tears Nosebleed This list may not describe all possible side effects. Call your doctor for medical advice about side effects. You may report side effects to FDA at 1-800-FDA-1088. Where should I keep my medication? This medication is given in a hospital or clinic. It will not be stored at home. NOTE: This sheet is a summary. It may not cover all possible information. If you have questions about this medicine, talk to your doctor, pharmacist, or health care provider.  2024 Elsevier/Gold Standard (2021-05-31 00:00:00)

## 2022-10-23 NOTE — Progress Notes (Signed)
Ok to proceed without urine protein today per Dr. Mosetta Putt.

## 2022-10-23 NOTE — Assessment & Plan Note (Signed)
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  -He has required multiple blood transfusions -We discussed the benefit and side effect of bevacizumab for AVM related GI bleeding, he started on 09/11/22, plan to give every 2 weeks for total of 4 cycles.

## 2022-10-23 NOTE — Progress Notes (Signed)
[facility]   [phone] Fax:([phone]   Clinic Follow up Note   Patient Care Team: [name], MD as PCP - General (Internal Medicine) [name], MD as Consulting Physician (General Surgery) [name], MD as Consulting Physician (Gastroenterology) [name], MD as Consulting Physician (Otolaryngology) [name], MD as Consulting Physician (Cardiology) Suzi Roots as Physician Assistant (Dermatology) Malachy Mood, MD as Consulting Physician (Oncology)  Date of Service:  10/23/2022  CHIEF COMPLAINT: f/u of anemia   CURRENT THERAPY:  Bevacizumab every 2 weeksX4  Oncology History   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  Foundation One showed no targetable mutations. -He is finishing RT on 04/09/2022 -Due to advanced age, I do not plan to offer him chemotherapy -CT scan in June 2024 showed no evidence of residual disease or new metastasis, although CT is not ideal to evaluate the residual disease in his case.        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  -He has required multiple blood transfusions -We discussed the benefit and side effect of bevacizumab for AVM related GI bleeding, he started on 09/11/22, plan to give every 2 weeks for total of 4 cycles.  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 No new issues reported in the past two weeks. Patient recently recovered from COVID-19. -Continue monitoring blood counts. -Administer final  dose of Bevacizumab today. -Check blood counts and ferritin every two weeks for the next three times. -Schedule B12 injection in one month and two months. -Return for follow-up in two months.  COVID-19 Recently recovered from mild symptoms. No specific treatment was given. -Monitor for any lingering effects or complications.  Decreased Appetite Likely related to recent COVID-19 infection. No swallowing issues reported. -Encourage consumption of high-calorie nutritional drinks like Ensure or Boost.  Plan -Will proceed to last dose of bevacizumab today -He will repeat a CBC and a ferritin at the nursing home every 2 weeks -Lab and B12 injection in 4 and 8 weeks -Follow-up in 8 weeks    SUMMARY OF ONCOLOGIC HISTORY: Oncology History  Cancer of ampulla of Vater (HCC)  12/06/2021 Imaging   CT ABDOMEN PELVIS W CONTRAST   IMPRESSION: 1. There is moderate intrahepatic bile duct dilatation with marked fusiform dilatation of the common bile duct. No CT visible common bile duct stones identified. Differential considerations include a obstructing distal common bile duct stone (not visible by CT), distal CBD stricture, or mass at the level of the ampullary. Consider further evaluation with contrast enhanced MRI/MRCP or ERCP. 2. Contour the liver appears nodular which may reflect underlying cirrhosis. 3. Age-indeterminate compression fracture involving L1 vertebral body with loss of 50% of the vertebral body height. No signs of retropulsion of fracture fragments into the canal. 4. Fat containing umbilical hernia. 5. 5 mm anterolisthesis of L4 on L5. 6.  Aortic Atherosclerosis (ICD10-I70.0).   12/16/2021 Imaging   MR ABDOMEN MRCP W WO CONTAST   IMPRESSION: 1. Moderate intrahepatic and extrahepatic bile duct dilation with smooth tapering to the  level of the ampulla without filling defect or focal mass lesion identified. Findings may reflect ampullary stricture or occult ampullary  mass. Consider further evaluation with ERCP. 2. Mild dilated main pancreatic duct at the head measuring up to 8 mm. 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 Recommend follow-up pre and post-contrast MRI/MRCP in 2 years. This recommendation follows ACR consensus guidelines: Management of Incidental Pancreatic Cysts: A White Paper of the ACR Incidental Findings Committee. J Am Coll Radiol 2017;14:911-923. 3.  Aortic Atherosclerosis (ICD10-I70.0).   01/23/2022 Procedure   DG ERCP   IMPRESSION: Dilatation of the extrahepatic bile duct with severe tapering or narrowing in the distal common bile duct. Placement of biliary stent.   These images were submitted for radiologic interpretation only. Please see the procedural report for the amount of contrast.    01/23/2022 Procedure   EUS/ERCP  ENDOSONOGRAPHIC FINDING: There was dilation in the common bile duct (12.3 mm -> 20.4 mm) and in the common hepatic duct (23.0 mm). A small amount of hyperechoic material consistent with sludge was visualized endosonographically in the common bile duct. Moderate hyperechoic material consistent with sludge was visualized endosonographically in the gallbladder with normal gallbladder wall thickness. Pancreatic parenchymal abnormalities were noted in the entire pancreas. These consisted of hyperechoic foci with shadowing and cysts. There was a 9.1 mm by 6.1 mm cyst in the neck of the pancreas. There was a 15.3 mm by 14.1 mm cyst in the tail of the pancreas. The pancreatic duct had a dilated endosonographic appearance, had a prominently branched endosonographic appearance and had hyperechoic walls in the pancreatic head (PD - 8.2 mm -> 4.3 mm), genu of the pancreas (3.5 mm), body of the pancreas (3.2 mm) and tail of the pancreas (2.8 mm). A hypoechoic irregular lesion was identified endosonographically at the  ampulla. The lesion measured 15 mm by 13 mm in maximal cross-sectional diameter. The lesion extended from the mucosa to the submucosa. The outer margins were irregular. This is noted where CBD and PD dilation is noted to occur. Endosonographic imaging in the visualized portion of the liver showed no mass.  No malignant-appearing lymph nodes were visualized in the celiac region (level 20), peripancreatic region and porta hepatis region. The celiac region was visualized.   01/23/2022 Pathology Results   SURGICAL PATHOLOGY  CASE: [id number]  PATIENT: Charlesetta Ivory  Surgical Pathology Report   FINAL MICROSCOPIC DIAGNOSIS:   A. STOMACH, RANDOM, BIOPSY:  - Gastric mucosa, no significant abnormality.  No inflammation,  intestinal metaplasia, dysplasia or malignancy.   B. AMPULLARY LESION, BIOPSY:  - Adenocarcinoma.  See comment.    01/31/2022 Initial Diagnosis   Cancer of ampulla of Vater (HCC)   02/07/2022 Imaging    IMPRESSION: Interval increase in size of lymph node in the anterior cardiophrenic space on the right.   Increase small right pleural effusion compared to the previous MRI.   Subtle nodular tissue along the margin of the right hepatic lobe in the upper abdomen posteriorly. Recommend additional workup when appropriate.   Calcified pleural plaques.   Evidence of chronic liver disease.   Aortic Atherosclerosis (ICD10-I70.0).   02/14/2022 Miscellaneous   Foundation One:  Biomarker Findings Microsatellite status-Cannot be determined Tumor Mutation Burden-Cannot be determined  Genomics Findings EPHB4 amplificatio SF3B1 [id number] [id number]       Discussed the use of AI scribe software for clinical note transcription with the patient, who gave verbal consent  to proceed.  History of Present Illness   The patient, an 89 year old [place] with a history of ampullary adenocarcinoma, presents for follow-up of GI bleeding and anemia. He has been receiving bevacizumab  treatment, with the last dose administered today. Over the past two weeks, the patient reports no new issues and his hemoglobin levels have improved to 12.1. Despite this improvement, the patient does not report feeling more energetic.  Recently, the patient contracted and recovered from COVID-19. The symptoms were mild and did not require medication. However, since the infection, the patient has noticed a decrease in his appetite, which he attributes to the COVID-19 infection. He is currently supplementing his diet with high-calorie shakes.  The patient also reports chronic irritation in his right eye, which has been ongoing for several months. This is suspected to be due to allergies.         All other systems were reviewed with the patient and are negative.  MEDICAL HISTORY:  Past Medical History:  Diagnosis Date   Anal fistula    Arthritis    Fingers and hands   Atrial fibrillation (HCC)    AVM (arteriovenous malformation)    Clipped during Colonoscopy 05/2016   Barrett's esophagus    Bilateral cataracts    BPH (benign prostatic hyperplasia)    Cecal angiodysplasia 05/28/2016   ablated at colonoscopy   Deviated nasal septum    Diverticulosis of sigmoid colon    E. 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   Left Forehead (treatment after biopsy)   OSA on CPAP    Perianal rash    Recurrent epistaxis    Renal cyst 01/04/2013   Small left peripelvic renal cysts , noted on US Renal   SCCA (squamous cell carcinoma) of skin 10/17/2015   Left Sup Bridge of Nose (curet, cautery and 5FU)   Seasonal allergies    Superficial basal cell carcinoma (BCC) 10/17/2015   Left Bulb of Nose (curet, cautery and 5FU)   Tubular adenoma     SURGICAL HISTORY: Past Surgical History:  Procedure Laterality Date   BILIARY STENT PLACEMENT N/A  01/23/2022   Procedure: BILIARY STENT PLACEMENT;  Surgeon: Lemar Lofty., MD;  Location: [facility];  Service: Gastroenterology;  Laterality: N/A;   BILIARY STENT PLACEMENT N/A 08/06/2022   Procedure: BILIARY STENT PLACEMENT;  Surgeon: Lynann Bologna, MD;  Location: [facility];  Service: Gastroenterology;  Laterality: N/A;   BIOPSY  01/23/2022   Procedure: BIOPSY;  Surgeon: Meridee Score Netty Starring., MD;  Location: [facility];  Service: Gastroenterology;;   BIOPSY  07/09/2022   Procedure: BIOPSY;  Surgeon: Shellia Cleverly, DO;  Location: [facility]COPY;  Service: Gastroenterology;;   BIOPSY  08/02/2022   Procedure: BIOPSY;  Surgeon: Sherrilyn Rist, MD;  Location: [facility];  Service: Gastroenterology;;   COLONOSCOPY  multiple   CYSTOSCOPY     ENDOSCOPIC RETROGRADE CHOLANGIOPANCREATOGRAPHY (ERCP) WITH PROPOFOL N/A 01/23/2022   Procedure: ENDOSCOPIC RETROGRADE CHOLANGIOPANCREATOGRAPHY (ERCP) WITH PROPOFOL;  Surgeon: Lemar Lofty., MD;  Location: [facility];  Service: Gastroenterology;  Laterality: N/A;   ENTEROSCOPY N/A 08/02/2022   Procedure: ENTEROSCOPY;  Surgeon: Sherrilyn Rist, MD;  Location: [facility];  Service: Gastroenterology;  Laterality: N/A;   ERCP N/A 08/06/2022   Procedure: ENDOSCOPIC RETROGRADE CHOLANGIOPANCREATOGRAPHY (ERCP);  Surgeon: Lynann Bologna,  MD;  Location: [facility];  Service: Gastroenterology;  Laterality: N/A;   ESOPHAGOGASTRODUODENOSCOPY  multiple   ESOPHAGOGASTRODUODENOSCOPY N/A 01/23/2022   Procedure: ESOPHAGOGASTRODUODENOSCOPY (EGD);  Surgeon: Lemar Lofty., MD;  Location: [facility];  Service: Gastroenterology;  Laterality: N/A;   ESOPHAGOGASTRODUODENOSCOPY N/A 07/09/2022   Procedure: ESOPHAGOGASTRODUODENOSCOPY (EGD);  Surgeon: Shellia Cleverly, DO;  Location: [facility] ENDOSCOPY;  Service: Gastroenterology;  Laterality: N/A;   ESOPHAGOGASTRODUODENOSCOPY N/A 08/21/2022   Procedure: ESOPHAGOGASTRODUODENOSCOPY (EGD);   Surgeon: Jenel Lucks, MD;  Location: [facility] ENDOSCOPY;  Service: Gastroenterology;  Laterality: N/A;   EUS N/A 01/23/2022   Procedure: UPPER ENDOSCOPIC ULTRASOUND (EUS) RADIAL;  Surgeon: Lemar Lofty., MD;  Location: [facility];  Service: Gastroenterology;  Laterality: N/A;   EVALUATION UNDER ANESTHESIA WITH FISTULECTOMY N/A 03/02/2018   Procedure: ANORECTAL EXAM UNDER ANESTHESIA WITH REPAIR OF SUPERFICIAL PERIRECTAL FISTULA AND HEMORRHOIDECTOMY;  Surgeon: [name], MD;  Location: [facility];  Service: General;  Laterality: N/A;   EXPLORATORY LAPAROTOMY     HEMOSTASIS CLIP PLACEMENT  07/09/2022   Procedure: HEMOSTASIS CLIP PLACEMENT;  Surgeon: Shellia Cleverly, DO;  Location: [facility];  Service: Gastroenterology;;   HOT HEMOSTASIS N/A 08/02/2022   Procedure: HOT HEMOSTASIS (ARGON PLASMA COAGULATION/BICAP);  Surgeon: Sherrilyn Rist, MD;  Location: [facility];  Service: Gastroenterology;  Laterality: N/A;   HOT HEMOSTASIS N/A 08/21/2022   Procedure: HOT HEMOSTASIS (ARGON PLASMA COAGULATION/BICAP);  Surgeon: Jenel Lucks, MD;  Location: [facility] ENDOSCOPY;  Service: Gastroenterology;  Laterality: N/A;   IR THORACENTESIS ASP PLEURAL SPACE W/IMG GUIDE  07/10/2022   REMOVAL OF STONES  01/23/2022   Procedure: REMOVAL OF STONES;  Surgeon: Meridee Score Netty Starring., MD;  Location: [facility];  Service: Gastroenterology;;   REMOVAL OF STONES  08/06/2022   Procedure: REMOVAL OF STONES;  Surgeon: Lynann Bologna, MD;  Location: [facility];  Service: Gastroenterology;;   Dennison Mascot  01/23/2022   Procedure: Dennison Mascot;  Surgeon: Mansouraty, Netty Starring., MD;  Location: [facility];  Service: Gastroenterology;;    I have reviewed the social history and family history with the patient and they are unchanged from previous note.  ALLERGIES:  is allergic to nsaids.  MEDICATIONS:  Current Outpatient Medications  Medication Sig Dispense Refill   bumetanide (BUMEX) 1 MG tablet  Take 1 tablet (1 mg total) by mouth daily.     Cholecalciferol (VITAMIN D3) 5000 units CAPS Take 5,000 Units by mouth every morning.     dapagliflozin propanediol (FARXIGA) 5 MG TABS tablet Take 1 tablet (5 mg total) by mouth in the morning.     ferrous sulfate 325 (65 FE) MG tablet Take 325 mg by mouth every Monday, Wednesday, and Friday.     Glucosamine Sulfate 500 MG TABS Take 500 mg by mouth daily.     levothyroxine (SYNTHROID) 150 MCG tablet Take 150 mcg by mouth daily before breakfast.     nitroGLYCERIN (NITROSTAT) 0.4 MG SL tablet Place 1 tablet (0.4 mg total) under the tongue every 5 (five) minutes as needed for up to 25 days for chest pain. 25 tablet 3   olopatadine (PATANOL) 0.1 % ophthalmic solution Place 1 drop into both eyes daily as needed for allergies.     ondansetron (ZOFRAN) 8 MG tablet Take 1 tablet (8 mg total) by mouth every 8 (eight) hours as needed for nausea or vomiting. 20 tablet 0   pantoprazole (PROTONIX) 40 MG tablet Take 1 tablet (40 mg total) by mouth 2 (two) times daily.     potassium chloride SA (  KLOR-CON M) 20 MEQ tablet Take 1 tablet (20 mEq total) by mouth 2 (two) times daily. 60 tablet 0   sodium chloride (OCEAN) 0.65 % SOLN nasal spray Place 1 spray into both nostrils as needed for congestion.     sucralfate (CARAFATE) 1 GM/10ML suspension Take 10 mLs (1 g total) by mouth 4 (four) times daily -  with meals and at bedtime. 420 mL 0   No current facility-administered medications for this visit.    PHYSICAL EXAMINATION: ECOG PERFORMANCE STATUS: 3  Vitals:   10/23/22 1211  BP: 116/72  Pulse: 89  Resp: 18  Temp: 97.6 F (36.4 C)  SpO2: 100%   Wt Readings from Last 3 Encounters:  10/23/22 173 lb 6.4 oz (78.7 kg)  10/09/22 177 lb 11.2 oz (80.6 kg)  09/22/22 177 lb 3.2 oz (80.4 kg)     GENERAL:alert, no distress and comfortable SKIN: skin color, texture, turgor are normal, no rashes or significant lesions EYES: normal, Conjunctiva are pink and  non-injected, sclera clear, (+)Right eyelid erythema.    Musculoskeletal:no cyanosis of digits and no clubbing  NEURO: alert & oriented x 3 with fluent speech, no focal motor/sensory deficits   LABORATORY DATA:  I have reviewed the data as listed    Latest Ref Rng & Units 10/23/2022   11:Carl AM 10/09/2022    7:36 AM 10/02/2022    2:43 PM  CBC  WBC 4.0 - 10.5 K/uL 3.9  4.5  4.2   Hemoglobin 13.0 - 17.0 g/dL 69.6  29.5  28.4   Hematocrit 39.0 - 52.0 % 36.5  30.8  31.9   Platelets 150 - 400 K/uL 190  154  213         Latest Ref Rng & Units 10/23/2022   11:Carl AM 10/09/2022    7:36 AM 10/02/2022    2:43 PM  CMP  Glucose 70 - 99 mg/dL 132  90  440   BUN 8 - 23 mg/dL 45  39  43   Creatinine 0.61 - 1.24 mg/dL 1.02  7.25  3.66   Sodium 135 - 145 mmol/L 139  140  141   Potassium 3.5 - 5.1 mmol/L 4.2  4.0  5.0   Chloride 98 - 111 mmol/L 106  106  106   CO2 Carl - 32 mmol/L 24  27  28    Calcium 8.9 - 10.3 mg/dL 9.8  9.6  44.0   Total Protein 6.5 - 8.1 g/dL 7.3  6.7  7.0   Total Bilirubin 0.3 - 1.2 mg/dL 0.5  0.5  0.4   Alkaline Phos 38 - 126 U/L 153  118  128   AST 15 - 41 U/L 55  45  40   ALT 0 - 44 U/L 48  41  33       RADIOGRAPHIC STUDIES: I have personally reviewed the radiological images as listed and agreed with the findings in the report. No results found.    No orders of the defined types were placed in this encounter.  All questions were answered. The patient knows to call the clinic with any problems, questions or concerns. No barriers to learning was detected. The total time spent in the appointment was 25 minutes.     Malachy Mood, MD 10/23/2022

## 2022-10-23 NOTE — Assessment & Plan Note (Signed)
-  cTxN0M0, diagnosed in 12/2021, MMR normal  -I reviewed PET scan findings, which showed no evidence of distant metastasis  --I reviewed his molecular testing, MMR was normal, he is not a candidate for immunotherapy.  Foundation One showed no targetable mutations. -He is finishing RT on 04/09/2022 -Due to advanced age, I do not plan to offer him chemotherapy -CT scan in June 2024 showed no evidence of residual disease or new metastasis, although CT is not ideal to evaluate the residual disease in his case.

## 2022-10-27 ENCOUNTER — Telehealth: Payer: Self-pay | Admitting: Hematology

## 2022-10-28 DIAGNOSIS — Z515 Encounter for palliative care: Secondary | ICD-10-CM | POA: Diagnosis not present

## 2022-10-30 ENCOUNTER — Telehealth: Payer: Self-pay | Admitting: Dietician

## 2022-10-30 DIAGNOSIS — D649 Anemia, unspecified: Secondary | ICD-10-CM | POA: Diagnosis not present

## 2022-10-30 NOTE — Telephone Encounter (Signed)
Patient screened on MST. First attempt to reach. Called home# and there was no answer or voice mail.  Provided my cell# on mobile voice mail to return call to  for a remote nutrition consult.  Gennaro Africa, RDN, LDN Registered Dietitian, [facility] Part Time Remote (Usual office hours: Tuesday-Thursday) Cell: [phone]

## 2022-11-04 DIAGNOSIS — D649 Anemia, unspecified: Secondary | ICD-10-CM | POA: Diagnosis not present

## 2022-11-04 DIAGNOSIS — E039 Hypothyroidism, unspecified: Secondary | ICD-10-CM | POA: Diagnosis not present

## 2022-11-06 ENCOUNTER — Telehealth: Payer: Self-pay

## 2022-11-06 NOTE — Telephone Encounter (Signed)
Pt's last Ferritin blood draw on 11/04/2022 is 472 which was drawn at pt's SNF [facility]).  Labs were faxed to Dr. Latanya Maudlin office from Ellin Mayhew, LPN Unit Manager at [facility] [phone]).  Dr. Mosetta Putt reviewed labs and verbal order given w/readback to discontinue oral iron supplement.  Spoke with Ellin Mayhew on 11/06/2022 and gave verbal order to stop the oral iron supplement until further notice from Dr. Mosetta Putt.  Tresa Endo verbalized understanding and stated pt is transferring today to [facility] but she would make sure pt's orders are update before his discharge today.

## 2022-11-07 ENCOUNTER — Non-Acute Institutional Stay (SKILLED_NURSING_FACILITY): Payer: Self-pay | Admitting: Internal Medicine

## 2022-11-07 ENCOUNTER — Encounter: Payer: Self-pay | Admitting: Internal Medicine

## 2022-11-07 DIAGNOSIS — R499 Unspecified voice and resonance disorder: Secondary | ICD-10-CM | POA: Diagnosis not present

## 2022-11-07 DIAGNOSIS — C241 Malignant neoplasm of ampulla of Vater: Secondary | ICD-10-CM

## 2022-11-07 DIAGNOSIS — N1832 Chronic kidney disease, stage 3b: Secondary | ICD-10-CM

## 2022-11-07 DIAGNOSIS — E039 Hypothyroidism, unspecified: Secondary | ICD-10-CM

## 2022-11-07 DIAGNOSIS — G4733 Obstructive sleep apnea (adult) (pediatric): Secondary | ICD-10-CM | POA: Diagnosis not present

## 2022-11-07 DIAGNOSIS — I5032 Chronic diastolic (congestive) heart failure: Secondary | ICD-10-CM

## 2022-11-07 DIAGNOSIS — I4821 Permanent atrial fibrillation: Secondary | ICD-10-CM

## 2022-11-07 DIAGNOSIS — I10 Essential (primary) hypertension: Secondary | ICD-10-CM

## 2022-11-07 DIAGNOSIS — R1312 Dysphagia, oropharyngeal phase: Secondary | ICD-10-CM | POA: Diagnosis not present

## 2022-11-07 DIAGNOSIS — R634 Abnormal weight loss: Secondary | ICD-10-CM | POA: Diagnosis not present

## 2022-11-07 DIAGNOSIS — K5521 Angiodysplasia of colon with hemorrhage: Secondary | ICD-10-CM | POA: Diagnosis not present

## 2022-11-07 DIAGNOSIS — R2681 Unsteadiness on feet: Secondary | ICD-10-CM | POA: Diagnosis not present

## 2022-11-07 DIAGNOSIS — R278 Other lack of coordination: Secondary | ICD-10-CM | POA: Diagnosis not present

## 2022-11-07 DIAGNOSIS — R41841 Cognitive communication deficit: Secondary | ICD-10-CM | POA: Diagnosis not present

## 2022-11-07 DIAGNOSIS — G9341 Metabolic encephalopathy: Secondary | ICD-10-CM | POA: Diagnosis not present

## 2022-11-07 DIAGNOSIS — M6281 Muscle weakness (generalized): Secondary | ICD-10-CM | POA: Diagnosis not present

## 2022-11-08 DIAGNOSIS — I10 Essential (primary) hypertension: Secondary | ICD-10-CM | POA: Insufficient documentation

## 2022-11-08 NOTE — Progress Notes (Unsigned)
Provider:   Location:  [facility]   Place of Service:  SNF (31)  PCP: [name], MD Patient Care Team: [name], MD as PCP - General (Internal Medicine) [name], MD as Consulting Physician (General Surgery) [name], MD as Consulting Physician (Gastroenterology) [name], MD as Consulting Physician (Otolaryngology) [name], MD as Consulting Physician (Cardiology) Glyn Ade, PA-C as Physician Assistant (Dermatology) Malachy Mood, MD as Consulting Physician (Oncology)  Extended Emergency Contact Information Primary Emergency Contact: [name], [place] Phone: [phone] Relation: Daughter Secondary Emergency Contact: Belizaire(POA),[name], [place] [address] [place] Home Phone: [phone] Mobile Phone: [phone] Relation: Son  Code Status: DNR Goals of Care: Advanced Directive information    08/21/2022    2:07 PM  Advanced Directives  Would patient like information on creating a medical advance directive? No - Patient declined      Chief Complaint  Patient presents with   New Admit To SNF    HPI: Patient is a 89 y.o. 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 No Residual disease per Last CT On Monitoring per Onology  Recurent GI bleeding with Anemia  Got Bevacizumab 3 doses  HGB Good level Off Iron  Recent Covid Lost some weight Still hasa some cough  Also H/o A Fib Not on DOAC due to GI bleed Not watchman candidate either HTN, Chronic Heart Failure on Farxiga CKD  CPAP Unfortunately he has broken his Mask No Acute issues today Mostly stays in Wheelchair Can do some transfers but needs Help Cognitively seemed to be doing well Son in town    Past  Medical History:  Diagnosis Date   Anal fistula    Arthritis    Fingers and hands   Atrial fibrillation (HCC)    AVM (arteriovenous malformation)    Clipped during Colonoscopy 05/2016   Barrett's esophagus    Bilateral cataracts    BPH (benign prostatic hyperplasia)    Cecal angiodysplasia 05/28/2016   ablated at colonoscopy   Deviated nasal septum    Diverticulosis of sigmoid colon    E. 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   Left Forehead (treatment after biopsy)   OSA on CPAP    Perianal rash    Recurrent epistaxis    Renal cyst 01/04/2013   Small left peripelvic renal cysts , noted on US Renal   SCCA (squamous cell carcinoma) of skin 10/17/2015   Left Sup Bridge of Nose (curet, cautery and 5FU)   Seasonal allergies    Superficial basal cell carcinoma (BCC) 10/17/2015   Left Bulb of Nose (curet, cautery and 5FU)   Tubular adenoma    Past Surgical History:  Procedure Laterality Date   BILIARY STENT PLACEMENT N/A 01/23/2022   Procedure: BILIARY STENT PLACEMENT;  Surgeon: Lemar Lofty., MD;  [facility]: [facility];  Service: Gastroenterology;  Laterality: N/A;   BILIARY STENT PLACEMENT N/A 08/06/2022   Procedure: BILIARY STENT PLACEMENT;  Surgeon: Lynann Bologna, MD;  Location: [facility];  Service: Gastroenterology;  Laterality: N/A;   BIOPSY  01/23/2022   Procedure: BIOPSY;  Surgeon: Meridee Score Netty Starring., MD;  Location: [facility];  Service: Gastroenterology;;   BIOPSY  07/09/2022   Procedure: BIOPSY;  Surgeon: Shellia Cleverly, DO;  Location: [facility];  Service: Gastroenterology;;   BIOPSY  08/02/2022   Procedure: BIOPSY;  Surgeon: Sherrilyn Rist, MD;  [facility]: [facility];  Service: Gastroenterology;;   COLONOSCOPY  multiple   CYSTOSCOPY     ENDOSCOPIC RETROGRADE CHOLANGIOPANCREATOGRAPHY  (ERCP) WITH PROPOFOL N/A 01/23/2022   Procedure: ENDOSCOPIC RETROGRADE CHOLANGIOPANCREATOGRAPHY (ERCP) WITH PROPOFOL;  Surgeon: Lemar Lofty., MD;  [facility];  Service: Gastroenterology;  Laterality: N/A;   ENTEROSCOPY N/A 08/02/2022   Procedure: ENTEROSCOPY;  Surgeon: Sherrilyn Rist, MD;  [facility];  Service: Gastroenterology;  Laterality: N/A;   ERCP N/A 08/06/2022   Procedure: ENDOSCOPIC RETROGRADE CHOLANGIOPANCREATOGRAPHY (ERCP);  Surgeon: Lynann Bologna, MD;  Location: [facility];  Service: Gastroenterology;  Laterality: N/A;   ESOPHAGOGASTRODUODENOSCOPY  multiple   ESOPHAGOGASTRODUODENOSCOPY N/A 01/23/2022   Procedure: ESOPHAGOGASTRODUODENOSCOPY (EGD);  Surgeon: Lemar Lofty., MD;  Location: [facility];  Service: Gastroenterology;  Laterality: N/A;   ESOPHAGOGASTRODUODENOSCOPY N/A 07/09/2022   Procedure: ESOPHAGOGASTRODUODENOSCOPY (EGD);  Surgeon: Shellia Cleverly, DO;  Location: [facility] ENDOSCOPY;  Service: Gastroenterology;  Laterality: N/A;   ESOPHAGOGASTRODUODENOSCOPY N/A 08/21/2022   Procedure: ESOPHAGOGASTRODUODENOSCOPY (EGD);  Surgeon: Jenel Lucks, MD;  Location: [facility] ENDOSCOPY;  Service: Gastroenterology;  Laterality: N/A;   EUS N/A 01/23/2022   Procedure: UPPER ENDOSCOPIC ULTRASOUND (EUS) RADIAL;  Surgeon: Lemar Lofty., MD;  [facility]: [facility];  Service: Gastroenterology;  Laterality: N/A;   EVALUATION UNDER ANESTHESIA WITH FISTULECTOMY N/A 03/02/2018   Procedure: ANORECTAL EXAM UNDER ANESTHESIA WITH REPAIR OF SUPERFICIAL PERIRECTAL FISTULA AND HEMORRHOIDECTOMY;  Surgeon: [name], MD;  Location: [facility];  Service: General;  Laterality: N/A;   EXPLORATORY LAPAROTOMY     HEMOSTASIS CLIP PLACEMENT  07/09/2022   Procedure: HEMOSTASIS CLIP PLACEMENT;  Surgeon: Shellia Cleverly, DO;  Location: [facility];  Service: Gastroenterology;;   HOT HEMOSTASIS N/A 08/02/2022   Procedure: HOT HEMOSTASIS (ARGON PLASMA  COAGULATION/BICAP);  Surgeon: Sherrilyn Rist, MD;  Location: [facility];  Service: Gastroenterology;  Laterality: N/A;   HOT HEMOSTASIS N/A 08/21/2022   Procedure: HOT HEMOSTASIS (ARGON PLASMA COAGULATION/BICAP);  Surgeon: Jenel Lucks, MD;  Location: [facility] ENDOSCOPY;  Service: Gastroenterology;  Laterality: N/A;   IR THORACENTESIS ASP PLEURAL SPACE W/IMG GUIDE  07/10/2022   REMOVAL OF STONES  01/23/2022   Procedure: REMOVAL OF STONES;  Surgeon: Lemar Lofty., MD;  Location: [facility];  Service: Gastroenterology;;   REMOVAL OF STONES  08/06/2022   Procedure: REMOVAL OF STONES;  Surgeon: Lynann Bologna, MD;  Location: [facility];  Service: Gastroenterology;;   Dennison Mascot  01/23/2022   Procedure: Dennison Mascot;  Surgeon: Mansouraty, Netty Starring., MD;  [facility]: [facility];  Service: Gastroenterology;;    reports that he quit smoking about 18 years ago. His smoking use included cigarettes. He started smoking about 58 years ago. He has a 20 pack-year smoking history. He has never used smokeless tobacco. He reports current alcohol use. He reports that he does not use drugs. Social History   Socioeconomic History   Marital status: Married    Spouse name: Not on file   Number of children: 3   Years of education: Not on file   Highest education level: Not on file  Occupational History   Occupation: retired  Tobacco Use   Smoking status: Former    Current packs/day: 0.00    Average packs/day: 0.5 packs/day for 40.0 years (20.0 ttl pk-yrs)    Types: Cigarettes    Start date: 1966    Quit date: 2006    Years since quitting: 18.7   Smokeless tobacco: Never  Vaping Use   Vaping status: Never Used  Substance and Sexual Activity   Alcohol use: Yes    Comment: occasional wine   Drug use: No   Sexual activity: Not on file  Other Topics Concern   Not on file  Social History Narrative   Not on file   Social Determinants of Health   Financial Resource Strain: Not  on file  Food Insecurity: No Food Insecurity (08/21/2022)   Hunger Vital Sign    Worried About Running Out of Food in the Last Year: Never true    Ran Out of Food in the Last Year: Never true  Transportation Needs: Patient Declined (08/21/2022)   PRAPARE - Administrator, Civil Service (Medical): Patient declined    Lack of Transportation (Non-Medical): Patient declined  Physical Activity: Not on file  Stress: Not on file  Social Connections: Not on file  Intimate Partner Violence: Patient Declined (08/21/2022)   Humiliation, Afraid, Rape, and Kick questionnaire    Fear of Current or Ex-Partner: Patient declined    Emotionally Abused: Patient declined    Physically Abused: Patient declined    Sexually Abused: Patient declined    Functional Status Survey:    Family History  Problem Relation Age of Onset   Heart disease Mother    Other Father        old age   Ovarian cancer Sister    Colon cancer Neg Hx    Stomach cancer Neg Hx    Rectal cancer Neg Hx    Esophageal cancer Neg Hx    Liver cancer Neg Hx     Health Maintenance  Topic Date Due   COVID-19 Vaccine (1) Never done   Pneumonia Vaccine 59+ Years old (1 of 2 - PCV) Never done   DTaP/Tdap/Td (1 - Tdap) Never done   Zoster Vaccines- Shingrix (1 of 2) Never done   INFLUENZA VACCINE  08/28/2022   HPV VACCINES  Aged Out    Allergies  Allergen Reactions   Nsaids Other (See Comments)    Patient is to not take these because of kidney issues    Outpatient Encounter Medications as of 11/07/2022  Medication Sig   bumetanide (BUMEX) 1 MG tablet Take 1 tablet (1 mg total) by mouth daily.   Cholecalciferol (VITAMIN D3) 5000 units CAPS Take 5,000 Units by mouth every morning.   dapagliflozin propanediol (FARXIGA) 5 MG TABS tablet Take 1 tablet (5 mg total) by mouth in the morning.   Glucosamine Sulfate 500 MG TABS Take 500 mg by mouth daily.   levothyroxine (SYNTHROID) 150 MCG tablet Take 150 mcg by mouth daily  before breakfast.   nitroGLYCERIN (NITROSTAT) 0.4 MG SL tablet Place 1 tablet (0.4 mg total) under the tongue every 5 (five) minutes as needed for up to 25 days for chest pain.   olopatadine (PATANOL) 0.1 % ophthalmic solution Place 1 drop into both eyes daily as needed for allergies.   ondansetron (ZOFRAN) 8 MG tablet Take 1 tablet (8 mg total) by mouth every 8 (eight) hours as needed for nausea or vomiting.   pantoprazole (PROTONIX) 40  MG tablet Take 1 tablet (40 mg total) by mouth 2 (two) times daily.   potassium chloride SA (KLOR-CON M) 20 MEQ tablet Take 1 tablet (20 mEq total) by mouth 2 (two) times daily.   sodium chloride (OCEAN) 0.65 % SOLN nasal spray Place 1 spray into both nostrils as needed for congestion.   sucralfate (CARAFATE) 1 GM/10ML suspension Take 10 mLs (1 g total) by mouth 4 (four) times daily -  with meals and at bedtime.   No facility-administered encounter medications on file as of 11/07/2022.    Review of Systems  Constitutional:  Positive for activity change and appetite change. Negative for unexpected weight change.  HENT: Negative.    Respiratory:  Positive for cough. Negative for shortness of breath.   Cardiovascular:  Positive for leg swelling.  Gastrointestinal:  Negative for constipation.  Genitourinary:  Negative for frequency.  Musculoskeletal:  Positive for gait problem. Negative for arthralgias and myalgias.  Skin: Negative.  Negative for rash.  Neurological:  Negative for dizziness and weakness.  Psychiatric/Behavioral:  Negative for confusion and sleep disturbance.   All other systems reviewed and are negative.   There were no vitals filed for this visit. There is no height or weight on file to calculate BMI. Physical Exam Vitals reviewed.  Constitutional:      Appearance: Normal appearance.  HENT:     Head: Normocephalic.     Nose: Nose normal.     Mouth/Throat:     Mouth: Mucous membranes are moist.     Pharynx: Oropharynx is clear.   Eyes:     Pupils: Pupils are equal, round, and reactive to light.  Cardiovascular:     Rate and Rhythm: Normal rate and regular rhythm.     Pulses: Normal pulses.     Heart sounds: No murmur heard. Pulmonary:     Effort: Pulmonary effort is normal. No respiratory distress.     Breath sounds: Normal breath sounds. No rales.  Abdominal:     General: Abdomen is flat. Bowel sounds are normal.     Palpations: Abdomen is soft.  Musculoskeletal:        General: No swelling.     Cervical back: Neck supple.  Skin:    General: Skin is warm.  Neurological:     General: No focal deficit present.     Mental Status: He is alert and oriented to person, place, and time.  Psychiatric:        Mood and Affect: Mood normal.        Thought Content: Thought content normal.     Labs reviewed: Basic Metabolic Panel: Recent Labs    07/08/22 0237 07/09/22 0126 08/03/22 0252 08/04/22 0301 08/05/22 9562 08/06/22 1308 08/21/22 0247 08/22/22 0116 10/02/22 1443 10/09/22 0736 10/23/22 1122  NA 137   < > 144 142 138   < >  --    < > 141 140 139  K 4.1   < > 3.4* 4.5 4.1   < >  --    < > 5.0 4.0 4.2  CL 104   < > 114* 117* 109   < >  --    < > 106 106 106  CO2 22   < > 19* 20* 21*   < >  --    < > 28 27 24   GLUCOSE 104*   < > 124* 103* 97   < >  --    < > 110* 90 121*  BUN 29*   < >  32* 31* 33*   < >  --    < > 43* 39* 45*  CREATININE 1.58*   < > 1.58* 1.69* 1.68*   < >  --    < > 1.67* 1.54* 1.50*  CALCIUM 8.6*   < > 8.2* 7.9* 8.2*   < >  --    < > 10.0 9.6 9.8  MG 1.7  --  1.4* 2.1 2.0  --  1.7  --   --   --   --   PHOS 2.1*  --  3.2  --   --   --   --   --   --   --   --    < > = values in this interval not displayed.   Liver Function Tests: Recent Labs    10/02/22 1443 10/09/22 0736 10/23/22 1122  AST 40 45* 55*  ALT 33 41 48*  ALKPHOS 128* 118 153*  BILITOT 0.4 0.5 0.5  PROT 7.0 6.7 7.3  ALBUMIN 3.5 3.3* 3.4*   Recent Labs    08/15/22 1925  LIPASE 27   No results for  input(s): "AMMONIA" in the last 8760 hours. CBC: Recent Labs    10/02/22 1443 10/09/22 0736 10/23/22 1122  WBC 4.2 4.5 3.9*  NEUTROABS 2.5 2.9 2.4  HGB 10.4* 10.1* 12.1*  HCT 31.9* 30.8* 36.5*  MCV 103.6* 104.1* 104.9*  PLT 213 154 190   Cardiac Enzymes: No results for input(s): "CKTOTAL", "CKMB", "CKMBINDEX", "TROPONINI" in the last 8760 hours. BNP: Invalid input(s): "POCBNP" Lab Results  Component Value Date   HGBA1C 4.4 (L) 08/02/2022   No results found for: "TSH" Lab Results  Component Value Date   VITAMINB12 451 08/02/2022   Lab Results  Component Value Date   FOLATE 10.9 08/02/2022   Lab Results  Component Value Date   IRON 139 08/02/2022   TIBC 288 08/02/2022   FERRITIN 444 (H) 10/23/2022    Imaging and Procedures obtained prior to SNF admission: No results found.  Assessment/Plan 1. AVM (arteriovenous malformation) of small bowel, acquired with hemorrhage Did well on Bevacizumab Follows with Oncology He is on carafate and Protonix  2. Permanent atrial fibrillation (HCC) No on Anticoagulation due to GI bleeds Follows with Dr Jacinto Halim  3. Chronic heart failure with preserved ejection fraction (HFpEF) (HCC) On Bumex and Farxiga Seems to be Euvolemic  4. OSA (obstructive sleep apnea) Needs new Mask and adjustment Will make Pulmonary Referal  5. 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 Primary hypertension Not on any meds  7. Hypothyroidism, unspecified type TSH not found Will need follow up of his TSH  8. Recent weight loss Thought ot be due to Covid Will Add Boost Continue to monitor  9. Stage 3b chronic kidney disease (HCC) Stable Creat   Family/ staff Communication:   Labs/tests ordered:

## 2022-11-10 ENCOUNTER — Encounter: Payer: Self-pay | Admitting: Internal Medicine

## 2022-11-10 ENCOUNTER — Encounter: Payer: Self-pay | Admitting: Hematology

## 2022-11-10 DIAGNOSIS — M6281 Muscle weakness (generalized): Secondary | ICD-10-CM | POA: Diagnosis not present

## 2022-11-10 DIAGNOSIS — R278 Other lack of coordination: Secondary | ICD-10-CM | POA: Diagnosis not present

## 2022-11-10 DIAGNOSIS — G9341 Metabolic encephalopathy: Secondary | ICD-10-CM | POA: Diagnosis not present

## 2022-11-11 DIAGNOSIS — M6281 Muscle weakness (generalized): Secondary | ICD-10-CM | POA: Diagnosis not present

## 2022-11-11 DIAGNOSIS — R278 Other lack of coordination: Secondary | ICD-10-CM | POA: Diagnosis not present

## 2022-11-11 DIAGNOSIS — R499 Unspecified voice and resonance disorder: Secondary | ICD-10-CM | POA: Diagnosis not present

## 2022-11-11 DIAGNOSIS — R1312 Dysphagia, oropharyngeal phase: Secondary | ICD-10-CM | POA: Diagnosis not present

## 2022-11-11 DIAGNOSIS — R41841 Cognitive communication deficit: Secondary | ICD-10-CM | POA: Diagnosis not present

## 2022-11-11 DIAGNOSIS — R2681 Unsteadiness on feet: Secondary | ICD-10-CM | POA: Diagnosis not present

## 2022-11-11 DIAGNOSIS — G9341 Metabolic encephalopathy: Secondary | ICD-10-CM | POA: Diagnosis not present

## 2022-11-12 DIAGNOSIS — R499 Unspecified voice and resonance disorder: Secondary | ICD-10-CM | POA: Diagnosis not present

## 2022-11-12 DIAGNOSIS — R1312 Dysphagia, oropharyngeal phase: Secondary | ICD-10-CM | POA: Diagnosis not present

## 2022-11-12 DIAGNOSIS — R2681 Unsteadiness on feet: Secondary | ICD-10-CM | POA: Diagnosis not present

## 2022-11-12 DIAGNOSIS — R41841 Cognitive communication deficit: Secondary | ICD-10-CM | POA: Diagnosis not present

## 2022-11-12 DIAGNOSIS — G9341 Metabolic encephalopathy: Secondary | ICD-10-CM | POA: Diagnosis not present

## 2022-11-12 DIAGNOSIS — M6281 Muscle weakness (generalized): Secondary | ICD-10-CM | POA: Diagnosis not present

## 2022-11-13 DIAGNOSIS — R278 Other lack of coordination: Secondary | ICD-10-CM | POA: Diagnosis not present

## 2022-11-13 DIAGNOSIS — R2681 Unsteadiness on feet: Secondary | ICD-10-CM | POA: Diagnosis not present

## 2022-11-13 DIAGNOSIS — G9341 Metabolic encephalopathy: Secondary | ICD-10-CM | POA: Diagnosis not present

## 2022-11-13 DIAGNOSIS — M6281 Muscle weakness (generalized): Secondary | ICD-10-CM | POA: Diagnosis not present

## 2022-11-13 DIAGNOSIS — R41841 Cognitive communication deficit: Secondary | ICD-10-CM | POA: Diagnosis not present

## 2022-11-13 DIAGNOSIS — R499 Unspecified voice and resonance disorder: Secondary | ICD-10-CM | POA: Diagnosis not present

## 2022-11-13 DIAGNOSIS — R1312 Dysphagia, oropharyngeal phase: Secondary | ICD-10-CM | POA: Diagnosis not present

## 2022-11-14 DIAGNOSIS — R2681 Unsteadiness on feet: Secondary | ICD-10-CM | POA: Diagnosis not present

## 2022-11-14 DIAGNOSIS — G9341 Metabolic encephalopathy: Secondary | ICD-10-CM | POA: Diagnosis not present

## 2022-11-14 DIAGNOSIS — M6281 Muscle weakness (generalized): Secondary | ICD-10-CM | POA: Diagnosis not present

## 2022-11-14 DIAGNOSIS — R41841 Cognitive communication deficit: Secondary | ICD-10-CM | POA: Diagnosis not present

## 2022-11-14 DIAGNOSIS — R499 Unspecified voice and resonance disorder: Secondary | ICD-10-CM | POA: Diagnosis not present

## 2022-11-14 DIAGNOSIS — R1312 Dysphagia, oropharyngeal phase: Secondary | ICD-10-CM | POA: Diagnosis not present

## 2022-11-17 DIAGNOSIS — M6281 Muscle weakness (generalized): Secondary | ICD-10-CM | POA: Diagnosis not present

## 2022-11-17 DIAGNOSIS — G9341 Metabolic encephalopathy: Secondary | ICD-10-CM | POA: Diagnosis not present

## 2022-11-17 DIAGNOSIS — R2681 Unsteadiness on feet: Secondary | ICD-10-CM | POA: Diagnosis not present

## 2022-11-18 DIAGNOSIS — R41841 Cognitive communication deficit: Secondary | ICD-10-CM | POA: Diagnosis not present

## 2022-11-18 DIAGNOSIS — G9341 Metabolic encephalopathy: Secondary | ICD-10-CM | POA: Diagnosis not present

## 2022-11-18 DIAGNOSIS — R1312 Dysphagia, oropharyngeal phase: Secondary | ICD-10-CM | POA: Diagnosis not present

## 2022-11-18 DIAGNOSIS — R499 Unspecified voice and resonance disorder: Secondary | ICD-10-CM | POA: Diagnosis not present

## 2022-11-18 DIAGNOSIS — R278 Other lack of coordination: Secondary | ICD-10-CM | POA: Diagnosis not present

## 2022-11-18 DIAGNOSIS — M6281 Muscle weakness (generalized): Secondary | ICD-10-CM | POA: Diagnosis not present

## 2022-11-19 DIAGNOSIS — R1312 Dysphagia, oropharyngeal phase: Secondary | ICD-10-CM | POA: Diagnosis not present

## 2022-11-19 DIAGNOSIS — R499 Unspecified voice and resonance disorder: Secondary | ICD-10-CM | POA: Diagnosis not present

## 2022-11-19 DIAGNOSIS — M6281 Muscle weakness (generalized): Secondary | ICD-10-CM | POA: Diagnosis not present

## 2022-11-19 DIAGNOSIS — G9341 Metabolic encephalopathy: Secondary | ICD-10-CM | POA: Diagnosis not present

## 2022-11-19 DIAGNOSIS — R41841 Cognitive communication deficit: Secondary | ICD-10-CM | POA: Diagnosis not present

## 2022-11-19 DIAGNOSIS — R278 Other lack of coordination: Secondary | ICD-10-CM | POA: Diagnosis not present

## 2022-11-20 ENCOUNTER — Inpatient Hospital Stay: Payer: PPO | Attending: Physician Assistant

## 2022-11-20 ENCOUNTER — Inpatient Hospital Stay: Payer: PPO

## 2022-11-20 DIAGNOSIS — Z85841 Personal history of malignant neoplasm of brain: Secondary | ICD-10-CM | POA: Diagnosis not present

## 2022-11-20 DIAGNOSIS — D519 Vitamin B12 deficiency anemia, unspecified: Secondary | ICD-10-CM | POA: Diagnosis not present

## 2022-11-20 DIAGNOSIS — R2681 Unsteadiness on feet: Secondary | ICD-10-CM | POA: Diagnosis not present

## 2022-11-20 DIAGNOSIS — G9341 Metabolic encephalopathy: Secondary | ICD-10-CM | POA: Diagnosis not present

## 2022-11-20 DIAGNOSIS — C241 Malignant neoplasm of ampulla of Vater: Secondary | ICD-10-CM

## 2022-11-20 DIAGNOSIS — R278 Other lack of coordination: Secondary | ICD-10-CM | POA: Diagnosis not present

## 2022-11-20 DIAGNOSIS — D649 Anemia, unspecified: Secondary | ICD-10-CM

## 2022-11-20 DIAGNOSIS — M6281 Muscle weakness (generalized): Secondary | ICD-10-CM | POA: Diagnosis not present

## 2022-11-20 LAB — SAMPLE TO BLOOD BANK

## 2022-11-20 LAB — CBC WITH DIFFERENTIAL/PLATELET
Abs Immature Granulocytes: 0.02 10*3/uL (ref 0.00–0.07)
Basophils Absolute: 0 10*3/uL (ref 0.0–0.1)
Basophils Relative: 1 %
Eosinophils Absolute: 0.1 10*3/uL (ref 0.0–0.5)
Eosinophils Relative: 3 %
HCT: 32.2 % — ABNORMAL LOW (ref 39.0–52.0)
Hemoglobin: 10.9 g/dL — ABNORMAL LOW (ref 13.0–17.0)
Immature Granulocytes: 1 %
Lymphocytes Relative: 24 %
Lymphs Abs: 0.9 10*3/uL (ref 0.7–4.0)
MCH: 36 pg — ABNORMAL HIGH (ref 26.0–34.0)
MCHC: 33.9 g/dL (ref 30.0–36.0)
MCV: 106.3 fL — ABNORMAL HIGH (ref 80.0–100.0)
Monocytes Absolute: 0.5 10*3/uL (ref 0.1–1.0)
Monocytes Relative: 14 %
Neutro Abs: 2.3 10*3/uL (ref 1.7–7.7)
Neutrophils Relative %: 57 %
Platelets: 177 10*3/uL (ref 150–400)
RBC: 3.03 MIL/uL — ABNORMAL LOW (ref 4.22–5.81)
RDW: 17.4 % — ABNORMAL HIGH (ref 11.5–15.5)
WBC: 3.9 10*3/uL — ABNORMAL LOW (ref 4.0–10.5)
nRBC: 0 % (ref 0.0–0.2)

## 2022-11-20 LAB — CMP (CANCER CENTER ONLY)
ALT: 26 U/L (ref 0–44)
AST: 34 U/L (ref 15–41)
Albumin: 3.2 g/dL — ABNORMAL LOW (ref 3.5–5.0)
Alkaline Phosphatase: 117 U/L (ref 38–126)
Anion gap: 10 (ref 5–15)
BUN: 27 mg/dL — ABNORMAL HIGH (ref 8–23)
CO2: 23 mmol/L (ref 22–32)
Calcium: 9.3 mg/dL (ref 8.9–10.3)
Chloride: 103 mmol/L (ref 98–111)
Creatinine: 1.71 mg/dL — ABNORMAL HIGH (ref 0.61–1.24)
GFR, Estimated: 38 mL/min — ABNORMAL LOW (ref >60)
Glucose, Bld: 104 mg/dL — ABNORMAL HIGH (ref 70–99)
Potassium: 4.2 mmol/L (ref 3.5–5.1)
Sodium: 136 mmol/L (ref 135–145)
Total Bilirubin: 0.6 mg/dL (ref 0.3–1.2)
Total Protein: 7.1 g/dL (ref 6.5–8.1)

## 2022-11-20 LAB — FERRITIN: Ferritin: 202 ng/mL (ref 24–336)

## 2022-11-20 MED ORDER — CYANOCOBALAMIN 1000 MCG/ML IJ SOLN
1000.0000 ug | Freq: Once | INTRAMUSCULAR | Status: AC
  Administered 2022-11-20: 1000 ug via INTRAMUSCULAR
  Filled 2022-11-20: qty 1

## 2022-11-21 DIAGNOSIS — G9341 Metabolic encephalopathy: Secondary | ICD-10-CM | POA: Diagnosis not present

## 2022-11-21 DIAGNOSIS — R2681 Unsteadiness on feet: Secondary | ICD-10-CM | POA: Diagnosis not present

## 2022-11-21 DIAGNOSIS — M6281 Muscle weakness (generalized): Secondary | ICD-10-CM | POA: Diagnosis not present

## 2022-11-21 LAB — CANCER ANTIGEN 19-9: CA 19-9: 37 U/mL — ABNORMAL HIGH (ref 0–35)

## 2022-11-24 ENCOUNTER — Non-Acute Institutional Stay (SKILLED_NURSING_FACILITY): Payer: PPO | Admitting: Orthopedic Surgery

## 2022-11-24 ENCOUNTER — Encounter: Payer: Self-pay | Admitting: Orthopedic Surgery

## 2022-11-24 ENCOUNTER — Encounter: Payer: Self-pay | Admitting: Internal Medicine

## 2022-11-24 ENCOUNTER — Ambulatory Visit (INDEPENDENT_AMBULATORY_CARE_PROVIDER_SITE_OTHER): Payer: PPO | Admitting: Internal Medicine

## 2022-11-24 VITALS — BP 118/60 | HR 66 | Ht 67.0 in | Wt 171.0 lb

## 2022-11-24 DIAGNOSIS — S81812A Laceration without foreign body, left lower leg, initial encounter: Secondary | ICD-10-CM | POA: Diagnosis not present

## 2022-11-24 DIAGNOSIS — S81811A Laceration without foreign body, right lower leg, initial encounter: Secondary | ICD-10-CM | POA: Diagnosis not present

## 2022-11-24 DIAGNOSIS — K31819 Angiodysplasia of stomach and duodenum without bleeding: Secondary | ICD-10-CM

## 2022-11-24 DIAGNOSIS — W19XXXA Unspecified fall, initial encounter: Secondary | ICD-10-CM

## 2022-11-24 DIAGNOSIS — K5521 Angiodysplasia of colon with hemorrhage: Secondary | ICD-10-CM | POA: Diagnosis not present

## 2022-11-24 DIAGNOSIS — C241 Malignant neoplasm of ampulla of Vater: Secondary | ICD-10-CM | POA: Diagnosis not present

## 2022-11-24 DIAGNOSIS — Y92129 Unspecified place in nursing home as the place of occurrence of the external cause: Secondary | ICD-10-CM

## 2022-11-24 DIAGNOSIS — S51011A Laceration without foreign body of right elbow, initial encounter: Secondary | ICD-10-CM | POA: Diagnosis not present

## 2022-11-24 DIAGNOSIS — R278 Other lack of coordination: Secondary | ICD-10-CM | POA: Diagnosis not present

## 2022-11-24 DIAGNOSIS — R2681 Unsteadiness on feet: Secondary | ICD-10-CM | POA: Diagnosis not present

## 2022-11-24 DIAGNOSIS — G9341 Metabolic encephalopathy: Secondary | ICD-10-CM | POA: Diagnosis not present

## 2022-11-24 DIAGNOSIS — M6281 Muscle weakness (generalized): Secondary | ICD-10-CM | POA: Diagnosis not present

## 2022-11-24 NOTE — Progress Notes (Signed)
Carl Woods 89 y.o. 26-Jul-1931 [id number]  Assessment & Plan:   Encounter Diagnoses  Name Primary?   GAVE (gastric antral vascular ectasia) Yes   AVM (arteriovenous malformation) of small bowel, acquired with hemorrhage    Cancer of ampulla of Vater (HCC)     He is improved and seems stable.  Appreciate Dr. Latanya Maudlin care and use of bevacizumab.  Continue PPI and continue Carafate if affordable.  He could switch to tablets if the liquid version of Carafate is too expensive.  Since we think some of his issues are related to radiation it is reasonable to continue the Carafate though I do not think it is absolutely essential.  His daughter Randa Evens was on the phone during the visit today and reviewed things with her as well.  Continue supportive care otherwise and return here as needed.  Subjective:   Chief Complaint: Follow-up of GI bleeding secondary to GAVE and radiation duodenitis  HPI 38 year old white man who is now a resident at a friend's home who had ampullary carcinoma treated with biliary stenting and radiation and chemotherapy.  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  Since that time he has been treated with iron. Bevacizumab was also used and it is thought that that has reduced his bleeding issues.     Latest Ref Rng & Units 11/20/2022    1:29 PM 10/23/2022   11:22 AM 10/09/2022    7:36 AM  CBC  WBC 4.0 - 10.5 K/uL 3.9  3.9  4.5   Hemoglobin 13.0 - 17.0 g/dL 40.9  81.1  91.4   Hematocrit 39.0 - 52.0 % 32.2  36.5  30.8   Platelets 150 - 400 K/uL 177  190  154    Lab Results  Component Value Date   FERRITIN 202 11/20/2022    EGD 08/21/2022 - Normal esophagus. - Gastric antral vascular ectasia with bleeding. Treated with argon plasma coagulation (APC). - Chronic duodenitis with hemorrhage. Treated with argon plasma coagulation (APC).  Suspect this is related to radiation therapy, with bleeding possibly aggravated by irritation from metal stent. Unfortunately, this will likely be an ongoing problem without a definitive treatment - Metal stent in the duodenum.  Allergies  Allergen Reactions   Nsaids Other (See Comments)    Patient is to not take these because of kidney issues   Current Meds  Medication Sig   bumetanide (BUMEX) 1 MG tablet Take 1 tablet (1 mg total) by mouth daily.   cetirizine (ZYRTEC) 10 MG tablet Take 10 mg by mouth as needed for allergies.   Cholecalciferol (VITAMIN D3) 5000 units CAPS Take 5,000 Units by mouth every morning.   dapagliflozin propanediol (FARXIGA) 5 MG TABS tablet Take 1 tablet (5 mg total) by mouth in the morning.   Glucosamine Sulfate 500 MG TABS Take 500 mg by mouth daily.   levothyroxine (SYNTHROID) 175 MCG tablet Take 175 mcg by mouth daily before breakfast.   olopatadine (PATANOL) 0.1 % ophthalmic solution Place 1 drop into both eyes daily as needed for allergies.   ondansetron (ZOFRAN) 8 MG tablet Take 1 tablet (8 mg total) by mouth every 8 (eight) hours as needed for nausea or vomiting.   pantoprazole (PROTONIX) 40 MG tablet Take 1 tablet (40 mg total) by mouth 2 (two) times daily.   potassium chloride SA (KLOR-CON M) 20 MEQ tablet Take  1 tablet (20 mEq total) by mouth 2 (two) times daily.   sodium chloride (OCEAN) 0.65 % SOLN nasal spray Place 1 spray into both nostrils as needed for congestion.   Zinc Oxide 10 % OINT Apply 1 Application topically as needed.   [DISCONTINUED] sucralfate (CARAFATE) 1 GM/10ML suspension Take 10 mLs (1 g total) by mouth 4 (four) times daily -  with meals and at bedtime.   Past Medical History:  Diagnosis Date   Anal fistula    Arthritis    Fingers and hands   Atrial fibrillation (HCC)    AVM (arteriovenous malformation)    Clipped during Colonoscopy 05/2016   Barrett's esophagus    Bilateral cataracts    BPH (benign prostatic hyperplasia)     Cecal angiodysplasia 05/28/2016   ablated at colonoscopy   Deviated nasal septum    Diverticulosis of sigmoid colon    E. 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   Left Forehead (treatment after biopsy)   OSA on CPAP    Perianal rash    Recurrent epistaxis    Renal cyst 01/04/2013   Small left peripelvic renal cysts , noted on US Renal   SCCA (squamous cell carcinoma) of skin 10/17/2015   Left Sup Bridge of Nose (curet, cautery and 5FU)   Seasonal allergies    Superficial basal cell carcinoma (BCC) 10/17/2015   Left Bulb of Nose (curet, cautery and 5FU)   Tubular adenoma    Past Surgical History:  Procedure Laterality Date   BILIARY STENT PLACEMENT N/A 01/23/2022   Procedure: BILIARY STENT PLACEMENT;  Surgeon: Lemar Lofty., MD;  Location: [facility];  Service: Gastroenterology;  Laterality: N/A;   BILIARY STENT PLACEMENT N/A 08/06/2022   Procedure: BILIARY STENT PLACEMENT;  Surgeon: Lynann Bologna, MD;  Location: [facility];  Service: Gastroenterology;  Laterality: N/A;   BIOPSY  01/23/2022   Procedure: BIOPSY;  Surgeon: Meridee Score Netty Starring., MD;  Location: [facility];  Service: Gastroenterology;;   BIOPSY  07/09/2022   Procedure: BIOPSY;  Surgeon: Shellia Cleverly, DO;  Location: [facility];  Service: Gastroenterology;;   BIOPSY  08/02/2022   Procedure: BIOPSY;  Surgeon: Sherrilyn Rist, MD;  Location: [facility];  Service: Gastroenterology;;   COLONOSCOPY  multiple   CYSTOSCOPY     ENDOSCOPIC RETROGRADE CHOLANGIOPANCREATOGRAPHY (ERCP) WITH PROPOFOL N/A 01/23/2022   Procedure: ENDOSCOPIC RETROGRADE CHOLANGIOPANCREATOGRAPHY (ERCP) WITH PROPOFOL;  Surgeon: Lemar Lofty., MD;  Location: [facility];  Service: Gastroenterology;  Laterality: N/A;   ENTEROSCOPY N/A 08/02/2022   Procedure:  ENTEROSCOPY;  Surgeon: Sherrilyn Rist, MD;  Location: [facility];  Service: Gastroenterology;  Laterality: N/A;   ERCP N/A 08/06/2022   Procedure: ENDOSCOPIC RETROGRADE CHOLANGIOPANCREATOGRAPHY (ERCP);  Surgeon: Lynann Bologna, MD;  Location: [facility];  Service: Gastroenterology;  Laterality: N/A;   ESOPHAGOGASTRODUODENOSCOPY  multiple   ESOPHAGOGASTRODUODENOSCOPY N/A 01/23/2022   Procedure: ESOPHAGOGASTRODUODENOSCOPY (EGD);  Surgeon: Lemar Lofty., MD;  Location: [facility];  Service: Gastroenterology;  Laterality: N/A;   ESOPHAGOGASTRODUODENOSCOPY N/A 07/09/2022   Procedure: ESOPHAGOGASTRODUODENOSCOPY (EGD);  Surgeon: Shellia Cleverly, DO;  Location[facility]COPY;  Service: Gastroenterology;  Laterality: N/A;   ESOPHAGOGASTRODUODENOSCOPY N/A 08/21/2022   Procedure: ESOPHAGOGASTRODUODENOSCOPY (EGD);  Surgeon: Jenel Lucks, MD;  Location: [facility] ENDOSCOPY;  Service: Gastroenterology;  Laterality: N/A;  EUS N/A 01/23/2022   Procedure: UPPER ENDOSCOPIC ULTRASOUND (EUS) RADIAL;  Surgeon: Lemar Lofty., MD;  Location: [facility];  Service: Gastroenterology;  Laterality: N/A;   EVALUATION UNDER ANESTHESIA WITH FISTULECTOMY N/A 03/02/2018   Procedure: ANORECTAL EXAM UNDER ANESTHESIA WITH REPAIR OF SUPERFICIAL PERIRECTAL FISTULA AND HEMORRHOIDECTOMY;  Surgeon: [name], MD;  Location: [facility];  Service: General;  Laterality: N/A;   EXPLORATORY LAPAROTOMY     HEMOSTASIS CLIP PLACEMENT  07/09/2022   Procedure: HEMOSTASIS CLIP PLACEMENT;  Surgeon: Shellia Cleverly, DO;  Location: [facility];  Service: Gastroenterology;;   HOT HEMOSTASIS N/A 08/02/2022   Procedure: HOT HEMOSTASIS (ARGON PLASMA COAGULATION/BICAP);  Surgeon: Sherrilyn Rist, MD;  Location: [facility];  Service: Gastroenterology;  Laterality: N/A;   HOT HEMOSTASIS N/A 08/21/2022   Procedure: HOT HEMOSTASIS (ARGON PLASMA COAGULATION/BICAP);  Surgeon: Jenel Lucks, MD;  Location: [facility] ENDOSCOPY;   Service: Gastroenterology;  Laterality: N/A;   IR THORACENTESIS ASP PLEURAL SPACE W/IMG GUIDE  07/10/2022   REMOVAL OF STONES  01/23/2022   Procedure: REMOVAL OF STONES;  Surgeon: Meridee Score Netty Starring., MD;  Location: [facility]COPY;  Service: Gastroenterology;;   REMOVAL OF STONES  08/06/2022   Procedure: REMOVAL OF STONES;  Surgeon: Lynann Bologna, MD;  Location: [facility]COPY;  Service: Gastroenterology;;   Dennison Mascot  01/23/2022   Procedure: Dennison Mascot;  Surgeon: Mansouraty, Netty Starring., MD;  Location: [facility];  Service: Gastroenterology;;   Social History   Social History Narrative   Patient is married he is a resident at friend's home   He reports occasional wine consumption and former smoking history but none now.   family history includes Heart disease in his mother; Other in his father; Ovarian cancer in his sister.   Review of Systems As per HPI  Objective:   Physical Exam BP 118/60   Pulse 66   Ht 5\' 7"  (1.702 m)   Wt 171 lb (77.6 kg)   BMI 26.78 kg/m  Elderly white man in a wheelchair.  He is alert and oriented x 3.

## 2022-11-24 NOTE — Patient Instructions (Addendum)
Continue your sucralfate liquid unless it is too expensive. You can switch to a 1 gram tablet if needed.  _______________________________________________________  If your blood pressure at your visit was 140/90 or greater, please contact your primary care physician to follow up on this.  _______________________________________________________  If you are age 87 or older, your body mass index should be between 23-30. Your Body mass index is 26.78 kg/m. If this is out of the aforementioned range listed, please consider follow up with your Primary Care Provider.  If you are age 69 or younger, your body mass index should be between 19-25. Your Body mass index is 26.78 kg/m. If this is out of the aformentioned range listed, please consider follow up with your Primary Care Provider.   ________________________________________________________  The [facility] providers would like to encourage you to use [facility] to communicate with providers for non-urgent requests or questions.  Due to long hold times on the telephone, sending your provider a message by [facility] may be a faster and more efficient way to get a response.  Please allow 48 business hours for a response.  Please remember that this is for non-urgent requests.  _______________________________________________________  I appreciate the opportunity to care for you. Stan Head, MD, [facility]

## 2022-11-24 NOTE — Progress Notes (Signed)
Location:   [facility]  Nursing Home Room Number: 9-A Place of Service:  SNF [phone]) Provider:  Hazle Nordmann, NP  PCP: [name], MD  Patient Care Team: [name], MD as PCP - General (Internal Medicine) [name], MD as Consulting Physician (General Surgery) [name], MD as Consulting Physician (Gastroenterology) [name], MD as Consulting Physician (Otolaryngology) [name], MD as Consulting Physician (Cardiology) Glyn Ade, PA-C as Physician Assistant (Dermatology) Malachy Mood, MD as Consulting Physician (Oncology)  Extended Emergency Contact Information Primary Emergency Contact: [name], [place] Phone: [phone] Relation: Daughter Secondary Emergency Contact: [name],[name], [place] [address] [name] Home Phone: [phone] Mobile Phone: [phone] Relation: Son  Code Status:  DNR Goals of care: Advanced Directive information    11/24/2022    9:49 AM  Advanced Directives  Does Patient Have a Medical Advance Directive? Yes  Type of Estate agent of [place];Living will;Out of facility DNR (pink MOST or yellow form)  Does patient want to make changes to medical advance directive? No - Patient declined  Copy of Healthcare Power of Attorney in Chart? Yes - validated most recent copy scanned in chart (See row information)     Chief Complaint  Patient presents with   Acute Visit    Fall with skin tear.    HPI:  Pt is a 89 y.o. male seen today for an acute visit due to recent falls with skin tear.   11/06/2022 admitted to SNF at [facility]. PMH: CHF, Afib, AVM, NSTEMI, HTN, cancer of ampulla of vater, Barrett's esophagus, GERD, GI bleed, hypothyroidism, CKD, anemia, and unstable gait.   10/26 he was found on the floor by nursing. He was trying to use bathroom on his own and slid out of wheelchair. He has skin  tears to right elbow and both knees. Skin tears were covered with non adhesive dressing. Today, he is able to recall event. Denies hitting head. Denies pain or dizziness. Afebrile. Vital stable.    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   Left Forehead (treatment after biopsy)   OSA on CPAP    Perianal rash    Recurrent epistaxis    Renal cyst 01/04/2013   Small left peripelvic renal cysts , noted on US Renal   SCCA (squamous cell carcinoma) of skin 10/17/2015   Left Sup Bridge of Nose (curet, cautery and 5FU)   Seasonal allergies    Superficial basal cell carcinoma (BCC) 10/17/2015   Left Bulb of Nose (curet, cautery and 5FU)   Tubular adenoma    Past Surgical History:  Procedure Laterality Date   BILIARY STENT PLACEMENT N/A 01/23/2022   Procedure: BILIARY  STENT PLACEMENT;  Surgeon: Meridee Score Netty Starring., MD;  Location: [facility];  Service: Gastroenterology;  Laterality: N/A;   BILIARY STENT PLACEMENT N/A 08/06/2022   Procedure: BILIARY STENT PLACEMENT;  Surgeon: Lynann Bologna, MD;  Location: [facility];  Service: Gastroenterology;  Laterality: N/A;   BIOPSY  01/23/2022   Procedure: BIOPSY;  Surgeon: Meridee Score Netty Starring., MD;  Location: [facility];  Service: Gastroenterology;;   BIOPSY  07/09/2022   Procedure: BIOPSY;  Surgeon: Shellia Cleverly, DO;  Location: [facility] END[facility];  Service: Gastroenterology;;   BIOPSY   08/02/2022   Procedure: BIOPSY;  Surgeon: Sherrilyn Rist, MD;  Location: [facility];  Service: Gastroenterology;;   COLONOSCOPY  multiple   CYSTOSCOPY     ENDOSCOPIC RETROGRADE CHOLANGIOPANCREATOGRAPHY (ERCP) WITH PROPOFOL N/A 01/23/2022   Procedure: ENDOSCOPIC RETROGRADE CHOLANGIOPANCREATOGRAPHY (ERCP) WITH PROPOFOL;  Surgeon: Lemar Lofty., MD;  Location: [facility];  Service: Gastroenterology;  Laterality: N/A;   ENTEROSCOPY N/A 08/02/2022   Procedure: ENTEROSCOPY;  Surgeon: Sherrilyn Rist, MD;  Location: [facility];  Service: Gastroenterology;  Laterality: N/A;   ERCP N/A 08/06/2022   Procedure: ENDOSCOPIC RETROGRADE CHOLANGIOPANCREATOGRAPHY (ERCP);  Surgeon: Lynann Bologna, MD;  Location: [facility];  Service: Gastroenterology;  Laterality: N/A;   ESOPHAGOGASTRODUODENOSCOPY  multiple   ESOPHAGOGASTRODUODENOSCOPY N/A 01/23/2022   Procedure: ESOPHAGOGASTRODUODENOSCOPY (EGD);  Surgeon: Lemar Lofty., MD;  Location: [facility];  Service: Gastroenterology;  Laterality: N/A;   ESOPHAGOGASTRODUODENOSCOPY N/A 07/09/2022   Procedure: ESOPHAGOGASTRODUODENOSCOPY (EGD);  Surgeon: Shellia Cleverly, DO;  Location: [facility] ENDOSCOPY;  Service: Gastroenterology;  Laterality: N/A;   ESOPHAGOGASTRODUODENOSCOPY N/A 08/21/2022   Procedure: ESOPHAGOGASTRODUODENOSCOPY (EGD);  Surgeon: Jenel Lucks, MD;  Location: [facility] ENDOSCOPY;  Service: Gastroenterology;  Laterality: N/A;   EUS N/A 01/23/2022   Procedure: UPPER ENDOSCOPIC ULTRASOUND (EUS) RADIAL;  Surgeon: Lemar Lofty., MD;  Location: [facility];  Service: Gastroenterology;  Laterality: N/A;   EVALUATION UNDER ANESTHESIA WITH FISTULECTOMY N/A 03/02/2018   Procedure: ANORECTAL EXAM UNDER ANESTHESIA WITH REPAIR OF SUPERFICIAL PERIRECTAL FISTULA AND HEMORRHOIDECTOMY;  Surgeon: [name], MD;  Location: [facility]S;  Service: General;  Laterality: N/A;   EXPLORATORY LAPAROTOMY     HEMOSTASIS CLIP PLACEMENT  07/09/2022    Procedure: HEMOSTASIS CLIP PLACEMENT;  Surgeon: Shellia Cleverly, DO;  Location: [facility];  Service: Gastroenterology;;   HOT HEMOSTASIS N/A 08/02/2022   Procedure: HOT HEMOSTASIS (ARGON PLASMA COAGULATION/BICAP);  Surgeon: Sherrilyn Rist, MD;  Location: [facility];  Service: Gastroenterology;  Laterality: N/A;   HOT HEMOSTASIS N/A 08/21/2022   Procedure: HOT HEMOSTASIS (ARGON PLASMA COAGULATION/BICAP);  Surgeon: Jenel Lucks, MD;  Location: [facility] ENDOSCOPY;  Service: Gastroenterology;  Laterality: N/A;   IR THORACENTESIS ASP PLEURAL SPACE W/IMG GUIDE  07/10/2022   REMOVAL OF STONES  01/23/2022   Procedure: REMOVAL OF STONES;  Surgeon: Meridee Score Netty Starring., MD;  Location: [facility];  Service: Gastroenterology;;   REMOVAL OF STONES  08/06/2022   Procedure: REMOVAL OF STONES;  Surgeon: Lynann Bologna, MD;  Location: [facility];  Service: Gastroenterology;;   Dennison Mascot  01/23/2022   Procedure: Dennison Mascot;  Surgeon: Lemar Lofty., MD;  Location: [facility];  Service: Gastroenterology;;    Allergies  Allergen Reactions   Nsaids Other (See Comments)    Patient is to not take these because of kidney issues    Allergies as of 11/24/2022       Reactions   Nsaids Other (See Comments)   Patient is to not take these because of kidney issues  Medication List        Accurate as of November 24, 2022  9:49 AM. If you have any questions, ask your nurse or doctor.          STOP taking these medications    nitroGLYCERIN 0.4 MG SL tablet Commonly known as: NITROSTAT Stopped by: Norberta Stobaugh E Glorianna Gott       TAKE these medications    bumetanide 1 MG tablet Commonly known as: BUMEX Take 1 tablet (1 mg total) by mouth daily.   cetirizine 10 MG tablet Commonly known as: ZYRTEC Take 10 mg by mouth as needed for allergies.   dapagliflozin propanediol 5 MG Tabs tablet Commonly known as: FARXIGA Take 1 tablet (5 mg total) by mouth in the morning.    Glucosamine Sulfate 500 MG Tabs Take 500 mg by mouth daily.   levothyroxine 175 MCG tablet Commonly known as: SYNTHROID Take 175 mcg by mouth daily before breakfast. What changed: Another medication with the same name was removed. Continue taking this medication, and follow the directions you see here. Changed by: Octavia Heir   olopatadine 0.1 % ophthalmic solution Commonly known as: PATANOL Place 1 drop into both eyes daily as needed for allergies.   ondansetron 8 MG tablet Commonly known as: ZOFRAN Take 1 tablet (8 mg total) by mouth every 8 (eight) hours as needed for nausea or vomiting.   pantoprazole 40 MG tablet Commonly known as: PROTONIX Take 1 tablet (40 mg total) by mouth 2 (two) times daily.   potassium chloride SA 20 MEQ tablet Commonly known as: KLOR-CON M Take 1 tablet (20 mEq total) by mouth 2 (two) times daily.   sodium chloride 0.65 % Soln nasal spray Commonly known as: OCEAN Place 1 spray into both nostrils as needed for congestion.   sucralfate 1 GM/10ML suspension Commonly known as: CARAFATE Take 10 mLs (1 g total) by mouth 4 (four) times daily -  with meals and at bedtime.   Vitamin D3 125 MCG (5000 UT) Caps Take 5,000 Units by mouth every morning.   Zinc Oxide 10 % Oint Apply 1 Application topically as needed.        Review of Systems  Constitutional:  Negative for activity change and appetite change.  Respiratory:  Negative for cough, shortness of breath and wheezing.   Cardiovascular:  Negative for chest pain and leg swelling.  Musculoskeletal:  Positive for gait problem. Negative for arthralgias and neck pain.  Skin:  Positive for wound.  Neurological:  Positive for weakness. Negative for dizziness and light-headedness.  Psychiatric/Behavioral:  Negative for confusion. The patient is not nervous/anxious.      There is no immunization history on file for this patient. Pertinent  Health Maintenance Due  Topic Date Due   INFLUENZA  VACCINE  08/28/2022      03/02/2018    6:12 AM 08/30/2018   11:57 AM 08/23/2019    1:59 PM 08/19/2021    4:25 PM 01/23/2022   12:17 PM  Fall Risk  Falls in the past year?  1 0    Number of falls in past year - Comments  Emmi Telephone Survey Actual Response = 1     Was there an injury with Fall?  0     Fall Risk Category Calculator  2     Fall Risk Category (Retired)  Moderate     (RETIRED) Patient Fall Risk Level Moderate fall risk   Low fall risk Low fall risk   Functional Status Survey:  Vitals:   11/24/22 0943  BP: 120/66  Pulse: 72  Resp: 18  Temp: (!) 96 F (35.6 C)  SpO2: 95%  Weight: 171 lb 6.4 oz (77.7 kg)  Height: 5\' 8"  (1.727 m)   Body mass index is 26.06 kg/m. Physical Exam Vitals reviewed.  Constitutional:      General: He is not in acute distress. HENT:     Head: Normocephalic and atraumatic.  Eyes:     General:        Right eye: No discharge.        Left eye: No discharge.  Cardiovascular:     Rate and Rhythm: Normal rate. Rhythm irregular.     Pulses: Normal pulses.     Heart sounds: Normal heart sounds.  Pulmonary:     Effort: Pulmonary effort is normal. No respiratory distress.     Breath sounds: Normal breath sounds. No wheezing.  Abdominal:     General: Bowel sounds are normal.  Musculoskeletal:     Cervical back: Neck supple.     Right lower leg: No edema.     Left lower leg: No edema.     Comments: Able to move extremities without difficulty  Skin:    General: Skin is warm.     Capillary Refill: Capillary refill takes less than 2 seconds.     Comments: Right elbow with approx 0.5 cm skin tear, CDI. Left knee with approx 0.25 cm x 3 skin tear, CDI. Right knee with approx 0.25 cm x 2 skin tear, CDI.   Neurological:     General: No focal deficit present.     Mental Status: He is alert and oriented to person, place, and time.     Motor: Weakness present.     Gait: Gait abnormal.     Comments: wheelchair  Psychiatric:        Mood and  Affect: Mood normal.     Labs reviewed: Recent Labs    07/08/22 0237 07/09/22 0126 08/03/22 0252 08/04/22 0301 08/05/22 1610 08/06/22 0605 08/21/22 0247 08/22/22 0116 10/09/22 0736 10/23/22 1122 11/20/22 1329  NA 137   < > 144 142 138   < >  --    < > 140 139 136  K 4.1   < > 3.4* 4.5 4.1   < >  --    < > 4.0 4.2 4.2  CL 104   < > 114* 117* 109   < >  --    < > 106 106 103  CO2 22   < > 19* 20* 21*   < >  --    < > 27 24 23   GLUCOSE 104*   < > 124* 103* 97   < >  --    < > 90 121* 104*  BUN 29*   < > 32* 31* 33*   < >  --    < > 39* 45* 27*  CREATININE 1.58*   < > 1.58* 1.69* 1.68*   < >  --    < > 1.54* 1.50* 1.71*  CALCIUM 8.6*   < > 8.2* 7.9* 8.2*   < >  --    < > 9.6 9.8 9.3  MG 1.7  --  1.4* 2.1 2.0  --  1.7  --   --   --   --   PHOS 2.1*  --  3.2  --   --   --   --   --   --   --   --    < > =  values in this interval not displayed.   Recent Labs    10/09/22 0736 10/23/22 1122 11/20/22 1329  AST 45* 55* 34  ALT 41 48* 26  ALKPHOS 118 153* 117  BILITOT 0.5 0.5 0.6  PROT 6.7 7.3 7.1  ALBUMIN 3.3* 3.4* 3.2*   Recent Labs    10/09/22 0736 10/23/22 1122 11/20/22 1329  WBC 4.5 3.9* 3.9*  NEUTROABS 2.9 2.4 2.3  HGB 10.1* 12.1* 10.9*  HCT 30.8* 36.5* 32.2*  MCV 104.1* 104.9* 106.3*  PLT 154 190 177   No results found for: "TSH" Lab Results  Component Value Date   HGBA1C 4.4 (L) 08/02/2022   No results found for: "CHOL", "HDL", "LDLCALC", "LDLDIRECT", "TRIG", "CHOLHDL"  Significant Diagnostic Results in last 30 days:  No results found.  Assessment/Plan 1. Skin tear of right elbow without complication, initial encounter - fall 10/26> slipped out of wheelchair - no sign of infection - cont non adherent dressing changes prn  2. Skin tear of left lower leg without complication, initial encounter - see above  3. Skin tear of right lower leg without complication, initial encounter - see above  4. Fall at nursing home, initial encounter - 10/26  slipped out of wheelchair - discussed falls safety - discussed using call bell     Family/ staff Communication: plan discussed with patient and nurse  Labs/tests ordered: none

## 2022-11-25 ENCOUNTER — Telehealth: Payer: Self-pay | Admitting: Dietician

## 2022-11-25 DIAGNOSIS — R41841 Cognitive communication deficit: Secondary | ICD-10-CM | POA: Diagnosis not present

## 2022-11-25 DIAGNOSIS — G9341 Metabolic encephalopathy: Secondary | ICD-10-CM | POA: Diagnosis not present

## 2022-11-25 DIAGNOSIS — R499 Unspecified voice and resonance disorder: Secondary | ICD-10-CM | POA: Diagnosis not present

## 2022-11-25 DIAGNOSIS — R1312 Dysphagia, oropharyngeal phase: Secondary | ICD-10-CM | POA: Diagnosis not present

## 2022-11-25 NOTE — Telephone Encounter (Signed)
Patient screened on MST. Second attempt to reach. Left a message to call on voice mail.  Provided my cell# in a text to mobile to return call to  for a remote nutrition consult.  Gennaro Africa, RDN, LDN Registered Dietitian, [facility] Part Time Remote (Usual office hours: Tuesday-Thursday) Cell: [phone]

## 2022-11-26 ENCOUNTER — Non-Acute Institutional Stay (INDEPENDENT_AMBULATORY_CARE_PROVIDER_SITE_OTHER): Payer: PPO | Admitting: Orthopedic Surgery

## 2022-11-26 ENCOUNTER — Encounter: Payer: Self-pay | Admitting: Orthopedic Surgery

## 2022-11-26 DIAGNOSIS — R1312 Dysphagia, oropharyngeal phase: Secondary | ICD-10-CM | POA: Diagnosis not present

## 2022-11-26 DIAGNOSIS — Z0000 Encounter for general adult medical examination without abnormal findings: Secondary | ICD-10-CM

## 2022-11-26 DIAGNOSIS — R278 Other lack of coordination: Secondary | ICD-10-CM | POA: Diagnosis not present

## 2022-11-26 DIAGNOSIS — R499 Unspecified voice and resonance disorder: Secondary | ICD-10-CM | POA: Diagnosis not present

## 2022-11-26 DIAGNOSIS — G9341 Metabolic encephalopathy: Secondary | ICD-10-CM | POA: Diagnosis not present

## 2022-11-26 DIAGNOSIS — R2681 Unsteadiness on feet: Secondary | ICD-10-CM | POA: Diagnosis not present

## 2022-11-26 DIAGNOSIS — R41841 Cognitive communication deficit: Secondary | ICD-10-CM | POA: Diagnosis not present

## 2022-11-26 DIAGNOSIS — M6281 Muscle weakness (generalized): Secondary | ICD-10-CM | POA: Diagnosis not present

## 2022-11-26 NOTE — Progress Notes (Signed)
Subjective:   Carl Woods is a 89 y.o. male who presents for Medicare Annual/Subsequent preventive examination.  Visit Complete: In person  Patient Medicare AWV questionnaire was completed by the patient on 11/26/2022; I have confirmed that all information answered by patient is correct and no changes since this date.  Cardiac Risk Factors include: advanced age (>67men, >32 women);hypertension;male gender;sedentary lifestyle     Objective:    Today's Vitals   11/26/22 0934  BP: 117/65  Pulse: 77  Resp: 20  Temp: (!) 97.3 F (36.3 C)  SpO2: 95%  Weight: 171 lb 6.4 oz (77.7 kg)  Height: 5\' 8"  (1.727 m)   Body mass index is 26.06 kg/m.     11/24/2022    9:49 AM 08/21/2022    2:07 PM 08/15/2022    6:59 PM 08/01/2022    2:03 PM 07/09/2022    8:51 AM 07/06/2022    9:00 PM 07/06/2022   10:31 AM  Advanced Directives  Does Patient Have a Medical Advance Directive? Yes  No Yes Yes Yes Yes  Type of Estate agent of [place];Living will;Out of facility DNR (pink MOST or yellow form)    Living will Healthcare Power of [employer] of [place];Living will  Does patient want to make changes to medical advance directive? No - Patient declined     No - Patient declined   Copy of Healthcare Power of Attorney in Chart? Yes - validated most recent copy scanned in chart (See row information)    Yes - validated most recent copy scanned in chart (See row information) No - copy requested   Would patient like information on creating a medical advance directive?  No - Patient declined         Current Medications (verified) Outpatient Encounter Medications as of 11/26/2022  Medication Sig   bumetanide (BUMEX) 1 MG tablet Take 1 tablet (1 mg total) by mouth daily.   cetirizine (ZYRTEC) 10 MG tablet Take 10 mg by mouth as needed for allergies.   Cholecalciferol (VITAMIN D3) 5000 units CAPS Take 5,000 Units by mouth every morning.   dapagliflozin propanediol (FARXIGA)  5 MG TABS tablet Take 1 tablet (5 mg total) by mouth in the morning.   Glucosamine Sulfate 500 MG TABS Take 500 mg by mouth daily.   levothyroxine (SYNTHROID) 175 MCG tablet Take 175 mcg by mouth daily before breakfast.   olopatadine (PATANOL) 0.1 % ophthalmic solution Place 1 drop into both eyes daily as needed for allergies.   ondansetron (ZOFRAN) 8 MG tablet Take 1 tablet (8 mg total) by mouth every 8 (eight) hours as needed for nausea or vomiting.   pantoprazole (PROTONIX) 40 MG tablet Take 1 tablet (40 mg total) by mouth 2 (two) times daily.   potassium chloride SA (KLOR-CON M) 20 MEQ tablet Take 1 tablet (20 mEq total) by mouth 2 (two) times daily.   sodium chloride (OCEAN) 0.65 % SOLN nasal spray Place 1 spray into both nostrils as needed for congestion.   sucralfate (CARAFATE) 1 GM/10ML suspension Take 10 mLs (1 g total) by mouth 4 (four) times daily -  with meals and at bedtime.   Zinc Oxide 10 % OINT Apply 1 Application topically as needed.   No facility-administered encounter medications on file as of 11/26/2022.    Allergies (verified) Nsaids   History: Past Medical History:  Diagnosis Date   Anal fistula    Arthritis    Fingers and hands   Atrial fibrillation (  HCC)    AVM (arteriovenous malformation)    Clipped during Colonoscopy 05/2016   Barrett's esophagus    Bilateral cataracts    BPH (benign prostatic hyperplasia)    Cecal angiodysplasia 05/28/2016   ablated at colonoscopy   Deviated nasal septum    Diverticulosis of sigmoid colon    E. 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   Left Forehead (treatment after biopsy)   OSA on CPAP    Perianal rash    Recurrent epistaxis    Renal cyst 01/04/2013   Small left peripelvic renal cysts , noted on US Renal   SCCA (squamous cell carcinoma) of skin 10/17/2015    Left Sup Bridge of Nose (curet, cautery and 5FU)   Seasonal allergies    Superficial basal cell carcinoma (BCC) 10/17/2015   Left Bulb of Nose (curet, cautery and 5FU)   Tubular adenoma    Past Surgical History:  Procedure Laterality Date   BILIARY STENT PLACEMENT N/A 01/23/2022   Procedure: BILIARY STENT PLACEMENT;  Surgeon: Lemar Lofty., MD;  Location: [facility];  Service: Gastroenterology;  Laterality: N/A;   BILIARY STENT PLACEMENT N/A 08/06/2022   Procedure: BILIARY STENT PLACEMENT;  Surgeon: Lynann Bologna, MD;  Location: [facility];  Service: Gastroenterology;  Laterality: N/A;   BIOPSY  01/23/2022   Procedure: BIOPSY;  Surgeon: Meridee Score Netty Starring., MD;  Location: [facility];  Service: Gastroenterology;;   BIOPSY  07/09/2022   Procedure: BIOPSY;  Surgeon: Shellia Cleverly, DO;  Location: [facility];  Service: Gastroenterology;;   BIOPSY  08/02/2022   Procedure: BIOPSY;  Surgeon: Sherrilyn Rist, MD;  Location: [facility];  Service: Gastroenterology;;   COLONOSCOPY  multiple   CYSTOSCOPY     ENDOSCOPIC RETROGRADE CHOLANGIOPANCREATOGRAPHY (ERCP) WITH PROPOFOL N/A 01/23/2022   Procedure: ENDOSCOPIC RETROGRADE CHOLANGIOPANCREATOGRAPHY (ERCP) WITH PROPOFOL;  Surgeon: Lemar Lofty., MD;  Location: [facility];  Service: Gastroenterology;  Laterality: N/A;   ENTEROSCOPY N/A 08/02/2022   Procedure: ENTEROSCOPY;  Surgeon: Sherrilyn Rist, MD;  Location: [facility];  Service: Gastroenterology;  Laterality: N/A;   ERCP N/A 08/06/2022   Procedure: ENDOSCOPIC RETROGRADE CHOLANGIOPANCREATOGRAPHY (ERCP);  Surgeon: Lynann Bologna, MD;  Location: [facility];  Service: Gastroenterology;  Laterality: N/A;   ESOPHAGOGASTRODUODENOSCOPY  multiple   ESOPHAGOGASTRODUODENOSCOPY N/A 01/23/2022   Procedure: ESOPHAGOGASTRODUODENOSCOPY (EGD);  Surgeon: Lemar Lofty., MD;  Location: [facility];  Service: Gastroenterology;  Laterality: N/A;    ESOPHAGOGASTRODUODENOSCOPY N/A 07/09/2022   Procedure: ESOPHAGOGASTRODUODENOSCOPY (EGD);  Surgeon: Shellia Cleverly, DO;  Location[facility];  Service: Gastroenterology;  Laterality: N/A;   ESOPHAGOGASTRODUODENOSCOPY N/A 08/21/2022   Procedure: ESOPHAGOGASTRODUODENOSCOPY (EGD);  Surgeon: Jenel Lucks, MD;  Location: [facility] END[facility]COPY;  Service: Gastroenterology;  Laterality: N/A;   EUS N/A 01/23/2022   Procedure: UPPER ENDOSCOPIC ULTRASOUND (EUS) RADIAL;  Surgeon: Lemar Lofty., MD;  Location: [facility];  Service: Gastroenterology;  Laterality: N/A;   EVALUATION UNDER ANESTHESIA WITH FISTULECTOMY N/A 03/02/2018   Procedure: ANORECTAL EXAM UNDER ANESTHESIA WITH REPAIR OF SUPERFICIAL PERIRECTAL FISTULA AND HEMORRHOIDECTOMY;  Surgeon: [name], MD;  Location: [facility];  Service: General;  Laterality: N/A;   EXPLORATORY LAPAROTOMY     HEMOSTASIS CLIP PLACEMENT  07/09/2022   Procedure: HEMOSTASIS CLIP PLACEMENT;  Surgeon: Shellia Cleverly, DO;  Location: [facility];  Service:  Gastroenterology;;   HOT HEMOSTASIS N/A 08/02/2022   Procedure: HOT HEMOSTASIS (ARGON PLASMA COAGULATION/BICAP);  Surgeon: Sherrilyn Rist, MD;  Location: [facility];  Service: Gastroenterology;  Laterality: N/A;   HOT HEMOSTASIS N/A 08/21/2022   Procedure: HOT HEMOSTASIS (ARGON PLASMA COAGULATION/BICAP);  Surgeon: Jenel Lucks, MD;  Location: [facility]COPY;  Service: Gastroenterology;  Laterality: N/A;   IR THORACENTESIS ASP PLEURAL SPACE W/IMG GUIDE  07/10/2022   REMOVAL OF STONES  01/23/2022   Procedure: REMOVAL OF STONES;  Surgeon: Meridee Score Netty Starring., MD;  Location: [facility];  Service: Gastroenterology;;   REMOVAL OF STONES  08/06/2022   Procedure: REMOVAL OF STONES;  Surgeon: Lynann Bologna, MD;  Location: [facility];  Service: Gastroenterology;;   Dennison Mascot  01/23/2022   Procedure: Dennison Mascot;  Surgeon: Mansouraty, Netty Starring., MD;  Location: [facility];  Service:  Gastroenterology;;   Family History  Problem Relation Age of Onset   Heart disease Mother    Other Father        old age   Ovarian cancer Sister    Colon cancer Neg Hx    Stomach cancer Neg Hx    Rectal cancer Neg Hx    Esophageal cancer Neg Hx    Liver cancer Neg Hx    Social History   Socioeconomic History   Marital status: Married    Spouse name: Not on file   Number of children: 3   Years of education: Not on file   Highest education level: Not on file  Occupational History   Occupation: retired  Tobacco Use   Smoking status: Former    Current packs/day: 0.00    Average packs/day: 0.5 packs/day for 40.0 years (20.0 ttl pk-yrs)    Types: Cigarettes    Start date: 1966    Quit date: 2006    Years since quitting: 18.8   Smokeless tobacco: Never  Vaping Use   Vaping status: Never Used  Substance and Sexual Activity   Alcohol use: Yes    Comment: occasional wine   Drug use: No   Sexual activity: Not on file  Other Topics Concern   Not on file  Social History Narrative   Patient is married he is a resident at friend's home   He reports occasional wine consumption and former smoking history but none now.   Social Determinants of Health   Financial Resource Strain: Low Risk  (11/26/2022)   Overall Financial Resource Strain (CARDIA)    Difficulty of Paying Living Expenses: Not hard at all  Food Insecurity: No Food Insecurity (11/26/2022)   Hunger Vital Sign    Worried About Running Out of Food in the Last Year: Never true    Ran Out of Food in the Last Year: Never true  Transportation Needs: No Transportation Needs (11/26/2022)   PRAPARE - Administrator, Civil Service (Medical): No    Lack of Transportation (Non-Medical): No  Physical Activity: Insufficiently Active (11/26/2022)   Exercise Vital Sign    Days of Exercise per Week: 3 days    Minutes of Exercise per Session: 30 min  Stress: No Stress Concern Present (11/26/2022)   [employer]  of Occupational Health - Occupational Stress Questionnaire    Feeling of Stress : Not at all  Social Connections: Moderately Isolated (11/26/2022)   Social Connection and Isolation Panel [NHANES]    Frequency of Communication with Friends and Family: More than three times a week    Frequency of Social Gatherings with Friends  and Family: More than three times a week    Attends Religious Services: More than 4 times per year    Active Member of Clubs or Organizations: No    Attends Banker Meetings: Never    Marital Status: Widowed    Tobacco Counseling Counseling given: Not Answered   Clinical Intake:  Pre-visit preparation completed: No  Pain : No/denies pain     BMI - recorded: 26.06 Nutritional Status: BMI 25 -29 Overweight Nutritional Risks: None Diabetes: No  How often do you need to have someone help you when you read instructions, pamphlets, or other written materials from your doctor or pharmacy?: 2 - Rarely What is the last grade level you completed in school?: College 4 years  Interpreter Needed?: No      Activities of Daily Living    11/26/2022    1:37 PM 08/21/2022    2:00 PM  In your present state of health, do you have any difficulty performing the following activities:  Hearing? 0   Vision? 0   Difficulty concentrating or making decisions? 0   Walking or climbing stairs? 1   Dressing or bathing? 1   Doing errands, shopping? 1 1  Preparing Food and eating ? Y   Using the Toilet? Y   In the past six months, have you accidently leaked urine? Y   Do you have problems with loss of bowel control? N   Managing your Medications? Y   Managing your Finances? Y   Housekeeping or managing your Housekeeping? Y     Patient Care Team: [name], MD as PCP - General (Internal Medicine) [name], MD as Consulting Physician (General Surgery) [name], MD as Consulting Physician (Gastroenterology) [name], MD as Consulting  Physician (Otolaryngology) [name], MD as Consulting Physician (Cardiology) Suzi Roots as Physician Assistant (Dermatology) Malachy Mood, MD as Consulting Physician (Oncology)  Indicate any recent Medical Services you may have received from other than [facility] providers in the past year (date may be approximate).     Assessment:   This is a routine wellness examination for [place].  Hearing/Vision screen No results found.   Goals Addressed             This Visit's Progress    Maintain Mobility and Function   On track    Evidence-based guidance:  Acknowledge and validate impact of pain, loss of strength and potential disfigurement (hand osteoarthritis) on mental health and daily life, such as social isolation, anxiety, depression, impaired sexual relationship and   injury from falls.  Anticipate referral to physical or occupational therapy for assessment, therapeutic exercise and recommendation for adaptive equipment or assistive devices; encourage participation.  Assess impact on ability to perform activities of daily living, as well as engage in sports and leisure events or requirements of work or school.  Provide anticipatory guidance and reassurance about the benefit of exercise to maintain function; acknowledge and normalize fear that exercise may worsen symptoms.  Encourage regular exercise, at least 10 minutes at a time for 45 minutes per week; consider yoga, water exercise and proprioceptive exercises; encourage use of wearable activity tracker to increase motivation and adherence.  Encourage maintenance or resumption of daily activities, including employment, as pain allows and with minimal exposure to trauma.  Assist patient to advocate for adaptations to the work environment.  Consider level of pain and function, gender, age, lifestyle, patient preference, quality of life, readiness and ?ocapacity to benefit? when recommending  patients for orthopaedic surgery  consultation.  Explore strategies, such as changes to medication regimen or activity that enables patient to anticipate and manage flare-ups that increase deconditioning and disability.  Explore patient preferences; encourage exposure to a broader range of activities that have been avoided for fear of experiencing pain.  Identify barriers to participation in therapy or exercise, such as pain with activity, anticipated or imagined pain.  Monitor postoperative joint replacement or any preexisting joint replacement for ongoing pain and loss of function; provide social support and encouragement throughout recovery.   Notes:        Depression Screen    11/26/2022    1:44 PM 11/24/2022   12:05 PM 02/18/2022    3:45 PM  PHQ 2/9 Scores  PHQ - 2 Score 0 0 0    Fall Risk    11/26/2022    1:45 PM 11/24/2022   12:05 PM 08/23/2019    1:59 PM 08/30/2018   11:57 AM  Fall Risk   Falls in the past year? 1 1 0 1  Comment   Emmi Telephone Survey: data to providers prior to load Emmi Telephone Survey: data to providers prior to load  Number falls in past yr: 0 1  1  Comment    Emmi Telephone Survey Actual Response = 1  Injury with Fall? 1 1  0  Risk for fall due to : History of fall(s);Impaired balance/gait;Impaired mobility History of fall(s);Impaired balance/gait;Impaired mobility    Follow up Falls evaluation completed;Education provided;Falls prevention discussed Falls evaluation completed;Education provided;Falls prevention discussed      MEDICARE RISK AT HOME:    TIMED UP AND GO:  Was the test performed?  No    Cognitive Function:    11/26/2022    1:45 PM  MMSE - Mini Mental State Exam  Orientation to time 5  Orientation to Place 5  Registration 3  Attention/ Calculation 5  Recall 3  Language- name 2 objects 2  Language- repeat 1  Language- follow 3 step command 3  Language- read & follow direction 1  Write a sentence 1  Copy design 1  Total score 30         Immunizations  There is no immunization history on file for this patient.  TDAP status: Due, Education has been provided regarding the importance of this vaccine. Advised may receive this vaccine at local pharmacy or Health Dept. Aware to provide a copy of the vaccination record if obtained from local pharmacy or Health Dept. Verbalized acceptance and understanding.  Flu Vaccine status: Up to date  Pneumococcal vaccine status: Due, Education has been provided regarding the importance of this vaccine. Advised may receive this vaccine at local pharmacy or Health Dept. Aware to provide a copy of the vaccination record if obtained from local pharmacy or Health Dept. Verbalized acceptance and understanding.  Covid-19 vaccine status: Completed vaccines  Qualifies for Shingles Vaccine? Yes   Zostavax completed No   Shingrix Completed?: No.    Education has been provided regarding the importance of this vaccine. Patient has been advised to call insurance company to determine out of pocket expense if they have not yet received this vaccine. Advised may also receive vaccine at local pharmacy or Health Dept. Verbalized acceptance and understanding.  Screening Tests Health Maintenance  Topic Date Due   COVID-19 Vaccine (1) Never done   Pneumonia Vaccine 68+ Years old (1 of 2 - PCV) Never done   DTaP/Tdap/Td (1 - Tdap) Never done  Zoster Vaccines- Shingrix (1 of 2) Never done   INFLUENZA VACCINE  08/28/2022   HPV VACCINES  Aged Out    Health Maintenance  Health Maintenance Due  Topic Date Due   COVID-19 Vaccine (1) Never done   Pneumonia Vaccine 63+ Years old (1 of 2 - PCV) Never done   DTaP/Tdap/Td (1 - Tdap) Never done   Zoster Vaccines- Shingrix (1 of 2) Never done   INFLUENZA VACCINE  08/28/2022    Colorectal cancer screening: No longer required.   Lung Cancer Screening: (Low Dose CT Chest recommended if Age 51-80 years, 20 pack-year currently smoking OR have quit w/in 15years.)  does not qualify.   Lung Cancer Screening Referral: No> aged out  Additional Screening:  Hepatitis C Screening: does not qualify; Completed   Vision Screening: Recommended annual ophthalmology exams for early detection of glaucoma and other disorders of the eye. Is the patient up to date with their annual eye exam?  Yes  Who is the provider or what is the name of the office in which the patient attends annual eye exams? [facility] If pt is not established with a provider, would they like to be referred to a provider to establish care? No .   Dental Screening: Recommended annual dental exams for proper oral hygiene  Diabetic Foot Exam: Diabetic Foot Exam: Completed 11/26/2022  Community Resource Referral / Chronic Care Management: CRR required this visit?  No   CCM required this visit?  No     Plan:     I have personally reviewed and noted the following in the patient's chart:   Medical and social history Use of alcohol, tobacco or illicit drugs  Current medications and supplements including opioid prescriptions. Patient is not currently taking opioid prescriptions. Functional ability and status Nutritional status Physical activity Advanced directives List of other physicians Hospitalizations, surgeries, and ER visits in previous 12 months Vitals Screenings to include cognitive, depression, and falls Referrals and appointments  In addition, I have reviewed and discussed with patient certain preventive protocols, quality metrics, and best practice recommendations. A written personalized care plan for preventive services as well as general preventive health recommendations were provided to patient.     Octavia Heir, NP   11/26/2022   After Visit Summary: (MyChart) Due to this being a telephonic visit, the after visit summary with patients personalized plan was offered to patient via MyChart   Nurse Notes: Previous provider was [facility], before them Dr  Nicholos Johns. Moved to [facility] 11/06/2022. MMSE 30/30. Unclear vaccination record. Will have medical assistant look up in [facility]. Recommend Prevnar 20 if not UTD on PNA vaccine. Flu vaccine given 10/29 per [facility].

## 2022-11-27 DIAGNOSIS — M6281 Muscle weakness (generalized): Secondary | ICD-10-CM | POA: Diagnosis not present

## 2022-11-27 DIAGNOSIS — R41841 Cognitive communication deficit: Secondary | ICD-10-CM | POA: Diagnosis not present

## 2022-11-27 DIAGNOSIS — G9341 Metabolic encephalopathy: Secondary | ICD-10-CM | POA: Diagnosis not present

## 2022-11-27 DIAGNOSIS — R1312 Dysphagia, oropharyngeal phase: Secondary | ICD-10-CM | POA: Diagnosis not present

## 2022-11-27 DIAGNOSIS — R2681 Unsteadiness on feet: Secondary | ICD-10-CM | POA: Diagnosis not present

## 2022-11-27 DIAGNOSIS — R499 Unspecified voice and resonance disorder: Secondary | ICD-10-CM | POA: Diagnosis not present

## 2022-12-01 DIAGNOSIS — G9341 Metabolic encephalopathy: Secondary | ICD-10-CM | POA: Diagnosis not present

## 2022-12-01 DIAGNOSIS — M6281 Muscle weakness (generalized): Secondary | ICD-10-CM | POA: Diagnosis not present

## 2022-12-01 DIAGNOSIS — R2681 Unsteadiness on feet: Secondary | ICD-10-CM | POA: Diagnosis not present

## 2022-12-02 DIAGNOSIS — R41841 Cognitive communication deficit: Secondary | ICD-10-CM | POA: Diagnosis not present

## 2022-12-02 DIAGNOSIS — R1312 Dysphagia, oropharyngeal phase: Secondary | ICD-10-CM | POA: Diagnosis not present

## 2022-12-02 DIAGNOSIS — R499 Unspecified voice and resonance disorder: Secondary | ICD-10-CM | POA: Diagnosis not present

## 2022-12-02 DIAGNOSIS — G9341 Metabolic encephalopathy: Secondary | ICD-10-CM | POA: Diagnosis not present

## 2022-12-03 DIAGNOSIS — R2681 Unsteadiness on feet: Secondary | ICD-10-CM | POA: Diagnosis not present

## 2022-12-03 DIAGNOSIS — R1312 Dysphagia, oropharyngeal phase: Secondary | ICD-10-CM | POA: Diagnosis not present

## 2022-12-03 DIAGNOSIS — R499 Unspecified voice and resonance disorder: Secondary | ICD-10-CM | POA: Diagnosis not present

## 2022-12-03 DIAGNOSIS — M6281 Muscle weakness (generalized): Secondary | ICD-10-CM | POA: Diagnosis not present

## 2022-12-03 DIAGNOSIS — G9341 Metabolic encephalopathy: Secondary | ICD-10-CM | POA: Diagnosis not present

## 2022-12-03 DIAGNOSIS — R278 Other lack of coordination: Secondary | ICD-10-CM | POA: Diagnosis not present

## 2022-12-03 DIAGNOSIS — R41841 Cognitive communication deficit: Secondary | ICD-10-CM | POA: Diagnosis not present

## 2022-12-04 ENCOUNTER — Telehealth: Payer: Self-pay | Admitting: Dietician

## 2022-12-04 DIAGNOSIS — R499 Unspecified voice and resonance disorder: Secondary | ICD-10-CM | POA: Diagnosis not present

## 2022-12-04 DIAGNOSIS — M6281 Muscle weakness (generalized): Secondary | ICD-10-CM | POA: Diagnosis not present

## 2022-12-04 DIAGNOSIS — R2681 Unsteadiness on feet: Secondary | ICD-10-CM | POA: Diagnosis not present

## 2022-12-04 DIAGNOSIS — R41841 Cognitive communication deficit: Secondary | ICD-10-CM | POA: Diagnosis not present

## 2022-12-04 DIAGNOSIS — D649 Anemia, unspecified: Secondary | ICD-10-CM | POA: Diagnosis not present

## 2022-12-04 DIAGNOSIS — G9341 Metabolic encephalopathy: Secondary | ICD-10-CM | POA: Diagnosis not present

## 2022-12-04 DIAGNOSIS — R1312 Dysphagia, oropharyngeal phase: Secondary | ICD-10-CM | POA: Diagnosis not present

## 2022-12-04 DIAGNOSIS — R278 Other lack of coordination: Secondary | ICD-10-CM | POA: Diagnosis not present

## 2022-12-04 LAB — CBC AND DIFFERENTIAL
HCT: 28 — AB (ref 41–53)
Hemoglobin: 9.3 — AB (ref 13.5–17.5)
Neutrophils Absolute: 2701
Platelets: 90 10*3/uL — AB (ref 150–400)
WBC: 4.2

## 2022-12-04 LAB — CBC: RBC: 2.7 — AB (ref 3.87–5.11)

## 2022-12-04 NOTE — Telephone Encounter (Signed)
Third and final attempt to reach patient by telephone. Have left messages with return number. Please consult RD for future needs.    April Manson, RDN, LDN Registered Dietitian, [place] Part Time Remote (Usual office hours: Tuesday-Thursday) Mobile: [phone] Remote Office: [phone]

## 2022-12-08 DIAGNOSIS — R2681 Unsteadiness on feet: Secondary | ICD-10-CM | POA: Diagnosis not present

## 2022-12-08 DIAGNOSIS — G9341 Metabolic encephalopathy: Secondary | ICD-10-CM | POA: Diagnosis not present

## 2022-12-08 DIAGNOSIS — M6281 Muscle weakness (generalized): Secondary | ICD-10-CM | POA: Diagnosis not present

## 2022-12-08 DIAGNOSIS — E039 Hypothyroidism, unspecified: Secondary | ICD-10-CM | POA: Diagnosis not present

## 2022-12-08 LAB — TSH: TSH: 0.05 — AB (ref 0.41–5.90)

## 2022-12-08 LAB — VITAMIN D 25 HYDROXY (VIT D DEFICIENCY, FRACTURES): Vit D, 25-Hydroxy: 90

## 2022-12-09 ENCOUNTER — Encounter: Payer: Self-pay | Admitting: Adult Health

## 2022-12-09 ENCOUNTER — Other Ambulatory Visit: Payer: Self-pay

## 2022-12-09 ENCOUNTER — Telehealth: Payer: Self-pay

## 2022-12-09 ENCOUNTER — Non-Acute Institutional Stay (SKILLED_NURSING_FACILITY): Payer: PPO | Admitting: Adult Health

## 2022-12-09 DIAGNOSIS — Z8719 Personal history of other diseases of the digestive system: Secondary | ICD-10-CM

## 2022-12-09 DIAGNOSIS — R499 Unspecified voice and resonance disorder: Secondary | ICD-10-CM | POA: Diagnosis not present

## 2022-12-09 DIAGNOSIS — M6281 Muscle weakness (generalized): Secondary | ICD-10-CM | POA: Diagnosis not present

## 2022-12-09 DIAGNOSIS — G9341 Metabolic encephalopathy: Secondary | ICD-10-CM | POA: Diagnosis not present

## 2022-12-09 DIAGNOSIS — R278 Other lack of coordination: Secondary | ICD-10-CM | POA: Diagnosis not present

## 2022-12-09 DIAGNOSIS — E039 Hypothyroidism, unspecified: Secondary | ICD-10-CM | POA: Diagnosis not present

## 2022-12-09 DIAGNOSIS — R1312 Dysphagia, oropharyngeal phase: Secondary | ICD-10-CM | POA: Diagnosis not present

## 2022-12-09 DIAGNOSIS — R41841 Cognitive communication deficit: Secondary | ICD-10-CM | POA: Diagnosis not present

## 2022-12-09 MED ORDER — LEVOTHYROXINE SODIUM 150 MCG PO TABS: 150.0000 ug | ORAL_TABLET | Freq: Every day | ORAL | 3 refills | Status: DC

## 2022-12-09 NOTE — Progress Notes (Signed)
Location:  [facility] Room Number: N09-A Place of Service:  SNF (31) Provider:  Kenard Gower, DNP, FNP-BC  Patient Care Team: [name], MD as PCP - General (Internal Medicine) [name], MD as Consulting Physician (General Surgery) [name], MD as Consulting Physician (Gastroenterology) [name], MD as Consulting Physician (Otolaryngology) [name], MD as Consulting Physician (Cardiology) Glyn Ade, PA-C as Physician Assistant (Dermatology) Malachy Mood, MD as Consulting Physician (Oncology)  Extended Emergency Contact Information Primary Emergency Contact: Brechtel,[name] Address: **call 2x if no answer 1st time**          [address]          [place], [place] [address] [place] Phone: [phone] Relation: Daughter Secondary Emergency Contact: Rulon Sera, [place] Mobile Phone: [phone] Relation: Son  Code Status:  DNR  Goals of care: Advanced Directive information    12/09/2022    9:48 AM  Advanced Directives  Does Patient Have a Medical Advance Directive? Yes  Type of Estate agent of [place];Living will;Out of facility DNR (pink MOST or yellow form)  Does patient want to make changes to medical advance directive? No - Patient declined  Copy of Healthcare Power of Attorney in Chart? Yes - validated most recent copy scanned in chart (See row information)  Pre-existing out of facility DNR order (yellow form or pink MOST form) Yellow form placed in chart (order not valid for inpatient use)     Chief Complaint  Patient presents with   Acute Visit     low tsh    HPI:  Pt is a 89 y.o. male seen today for an acute visit regarding low tsh. He is a resident of [id number] and having rehabilitation. 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  TSH  0.05.   12/08/2022, currently taking levothyroxine 175 mcg 1 tab daily for hypothyroidism.  He denies bloody stool.  He takes Sucralfate and Pantoprazole for history of GI bleed.   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   Left Forehead (treatment after biopsy)   OSA on CPAP    Perianal rash    Recurrent epistaxis    Renal cyst 01/04/2013   Small left peripelvic renal cysts , noted on US Renal   SCCA (squamous cell carcinoma) of skin 10/17/2015   Left Sup Bridge of Nose (curet, cautery and 5FU)   Seasonal allergies    Superficial basal cell carcinoma (BCC) 10/17/2015   Left Bulb of Nose (curet, cautery and 5FU)   Tubular adenoma    Past Surgical History:  Procedure Laterality Date   BILIARY STENT PLACEMENT N/A 01/23/2022   Procedure: BILIARY STENT  PLACEMENT;  Surgeon: Meridee Score Netty Starring., MD;  Location: [facility];  Service: Gastroenterology;  Laterality: N/A;   BILIARY STENT PLACEMENT N/A 08/06/2022   Procedure: BILIARY STENT PLACEMENT;  Surgeon: Lynann Bologna, MD;  Location: [facility];  Service: Gastroenterology;  Laterality: N/A;   BIOPSY  01/23/2022   Procedure: BIOPSY;  Surgeon: Meridee Score Netty Starring., MD;  Location: [facility];  Service: Gastroenterology;;   BIOPSY  07/09/2022   Procedure: BIOPSY;  Surgeon: Shellia Cleverly, DO;  Location: [facility];  Service: Gastroenterology;;   BIOPSY  08/02/2022   Procedure:  BIOPSY;  Surgeon: Sherrilyn Rist, MD;  Location: [facility];  Service: Gastroenterology;;   COLONOSCOPY  multiple   CYSTOSCOPY     ENDOSCOPIC RETROGRADE CHOLANGIOPANCREATOGRAPHY (ERCP) WITH PROPOFOL N/A 01/23/2022   Procedure: ENDOSCOPIC RETROGRADE CHOLANGIOPANCREATOGRAPHY (ERCP) WITH PROPOFOL;  Surgeon: Lemar Lofty., MD;  Location: [facility];  Service: Gastroenterology;  Laterality: N/A;   ENTEROSCOPY N/A 08/02/2022   Procedure: ENTEROSCOPY;  Surgeon: Sherrilyn Rist, MD;  Location: [facility];  Service: Gastroenterology;  Laterality: N/A;   ERCP N/A 08/06/2022   Procedure: ENDOSCOPIC RETROGRADE CHOLANGIOPANCREATOGRAPHY (ERCP);  Surgeon: Lynann Bologna, MD;  Location: [facility];  Service: Gastroenterology;  Laterality: N/A;   ESOPHAGOGASTRODUODENOSCOPY  multiple   ESOPHAGOGASTRODUODENOSCOPY N/A 01/23/2022   Procedure: ESOPHAGOGASTRODUODENOSCOPY (EGD);  Surgeon: Lemar Lofty., MD;  Location: [facility];  Service: Gastroenterology;  Laterality: N/A;   ESOPHAGOGASTRODUODENOSCOPY N/A 07/09/2022   Procedure: ESOPHAGOGASTRODUODENOSCOPY (EGD);  Surgeon: Shellia Cleverly, DO;  Location: [facility];  Service: Gastroenterology;  Laterality: N/A;   ESOPHAGOGASTRODUODENOSCOPY N/A 08/21/2022   Procedure: ESOPHAGOGASTRODUODENOSCOPY (EGD);  Surgeon: Jenel Lucks, MD;  Location: [facility]COPY;  Service: Gastroenterology;  Laterality: N/A;   EUS N/A 01/23/2022   Procedure: UPPER ENDOSCOPIC ULTRASOUND (EUS) RADIAL;  Surgeon: Lemar Lofty., MD;  Location: [facility];  Service: Gastroenterology;  Laterality: N/A;   EVALUATION UNDER ANESTHESIA WITH FISTULECTOMY N/A 03/02/2018   Procedure: ANORECTAL EXAM UNDER ANESTHESIA WITH REPAIR OF SUPERFICIAL PERIRECTAL FISTULA AND HEMORRHOIDECTOMY;  Surgeon: [name], MD;  Location: [facility];  Service: General;  Laterality: N/A;   EXPLORATORY LAPAROTOMY     HEMOSTASIS CLIP PLACEMENT  07/09/2022   Procedure: HEMOSTASIS  CLIP PLACEMENT;  Surgeon: Shellia Cleverly, DO;  Location: [facility];  Service: Gastroenterology;;   HOT HEMOSTASIS N/A 08/02/2022   Procedure: HOT HEMOSTASIS (ARGON PLASMA COAGULATION/BICAP);  Surgeon: Sherrilyn Rist, MD;  Location: [facility];  Service: Gastroenterology;  Laterality: N/A;   HOT HEMOSTASIS N/A 08/21/2022   Procedure: HOT HEMOSTASIS (ARGON PLASMA COAGULATION/BICAP);  Surgeon: Jenel Lucks, MD;  Location: [facility]COPY;  Service: Gastroenterology;  Laterality: N/A;   IR THORACENTESIS ASP PLEURAL SPACE W/IMG GUIDE  07/10/2022   REMOVAL OF STONES  01/23/2022   Procedure: REMOVAL OF STONES;  Surgeon: Meridee Score Netty Starring., MD;  Location: [facility];  Service: Gastroenterology;;   REMOVAL OF STONES  08/06/2022   Procedure: REMOVAL OF STONES;  Surgeon: Lynann Bologna, MD;  Location: [facility];  Service: Gastroenterology;;   Dennison Mascot  01/23/2022   Procedure: Dennison Mascot;  Surgeon: Lemar Lofty., MD;  Location: [facility];  Service: Gastroenterology;;    Allergies  Allergen Reactions   Nsaids Other (See Comments)    Patient is to not take these because of kidney issues    Outpatient Encounter Medications as of 12/09/2022  Medication Sig   acetaminophen (TYLENOL) 325 MG tablet Take 650 mg by mouth every 6 (six) hours as needed for fever or moderate  pain (pain score 4-6).   bumetanide (BUMEX) 1 MG tablet Take 1 tablet (1 mg total) by mouth daily.   cetirizine (ZYRTEC) 10 MG tablet Take 10 mg by mouth as needed for allergies.   Cholecalciferol (VITAMIN D3) 5000 units CAPS Take 5,000 Units by mouth every morning.   dapagliflozin propanediol (FARXIGA) 5 MG TABS tablet Take 1 tablet (5 mg total) by mouth in the morning.   Glucosamine Sulfate 500 MG TABS Take 500 mg by mouth daily.   levothyroxine (SYNTHROID) 175 MCG tablet Take 175 mcg by mouth daily before breakfast.   Nutritional Supplements (BOOST PLUS PO) Take 1 Can by mouth once. 1 can  orally one time a day for weight loss   [START ON 12/18/2022] nystatin cream (MYCOSTATIN) Apply 1 Application topically every 8 (eight) hours as needed (Yeast).   olopatadine (PATANOL) 0.1 % ophthalmic solution Place 1 drop into both eyes daily as needed for allergies.   ondansetron (ZOFRAN) 8 MG tablet Take 1 tablet (8 mg total) by mouth every 8 (eight) hours as needed for nausea or vomiting.   pantoprazole (PROTONIX) 40 MG tablet Take 1 tablet (40 mg total) by mouth 2 (two) times daily.   potassium chloride SA (KLOR-CON M) 20 MEQ tablet Take 1 tablet (20 mEq total) by mouth 2 (two) times daily.   sodium chloride (OCEAN) 0.65 % SOLN nasal spray Place 1 spray into both nostrils as needed for congestion.   sucralfate (CARAFATE) 1 GM/10ML suspension Take 10 mLs (1 g total) by mouth 4 (four) times daily -  with meals and at bedtime.   Zinc Oxide 10 % OINT Apply 1 Application topically as needed.   No facility-administered encounter medications on file as of 12/09/2022.    Review of Systems  Constitutional:  Negative for activity change, appetite change and fever.  HENT:  Negative for sore throat.   Eyes: Negative.   Cardiovascular:  Negative for chest pain and leg swelling.  Gastrointestinal:  Negative for abdominal distention, diarrhea and vomiting.  Genitourinary:  Negative for dysuria, frequency and urgency.  Skin:  Negative for color change.  Neurological:  Negative for dizziness and headaches.  Psychiatric/Behavioral:  Negative for behavioral problems and sleep disturbance. The patient is not nervous/anxious.       Immunization History  Administered Date(s) Administered   Fluad Quad(high Dose 65+) 11/26/2022   PFIZER Comirnaty(Gray Top)Covid-19 Tri-Sucrose Vaccine 03/05/2019, 03/28/2019   PPD Test 08/30/2022, 09/13/2022   Pfizer Covid-19 Vaccine Bivalent Booster 11yrs & up 01/12/2020   Pertinent  Health Maintenance Due  Topic Date Due   INFLUENZA VACCINE  Completed       08/23/2019    1:59 PM 08/19/2021    4:25 PM 01/23/2022   12:17 PM 11/24/2022   12:05 PM 11/26/2022    1:45 PM  Fall Risk  Falls in the past year? 0   1 1  Was there an injury with Fall?    1 1  Fall Risk Category Calculator    3 2  (RETIRED) Patient Fall Risk Level  Low fall risk Low fall risk    Patient at Risk for Falls Due to    History of fall(s);Impaired balance/gait;Impaired mobility History of fall(s);Impaired balance/gait;Impaired mobility  Fall risk Follow up    Falls evaluation completed;Education provided;Falls prevention discussed Falls evaluation completed;Education provided;Falls prevention discussed     Vitals:   12/09/22 0919  BP: 106/65  Pulse: 61  Resp: 16  Temp: (!) 97.1 F (36.2 C)  SpO2:  95%  Weight: 171 lb 11.2 oz (77.9 kg)  Height: 5\' 8"  (1.727 m)   Body mass index is 26.11 kg/m.  Physical Exam Constitutional:      Appearance: Normal appearance.  HENT:     Head: Normocephalic and atraumatic.     Mouth/Throat:     Mouth: Mucous membranes are moist.  Eyes:     Conjunctiva/sclera: Conjunctivae normal.  Cardiovascular:     Rate and Rhythm: Normal rate and regular rhythm.     Pulses: Normal pulses.     Heart sounds: Normal heart sounds.  Pulmonary:     Effort: Pulmonary effort is normal.     Breath sounds: Normal breath sounds.  Abdominal:     General: Bowel sounds are normal.     Palpations: Abdomen is soft.  Musculoskeletal:        General: No swelling. Normal range of motion.     Cervical back: Normal range of motion.  Skin:    General: Skin is warm and dry.  Neurological:     General: No focal deficit present.     Mental Status: He is alert and oriented to person, place, and time.  Psychiatric:        Mood and Affect: Mood normal.        Behavior: Behavior normal.        Thought Content: Thought content normal.        Judgment: Judgment normal.        Labs reviewed: Recent Labs    07/08/22 0237 07/09/22 0126 08/03/22 0252  08/04/22 0301 08/05/22 2536 08/06/22 6440 08/21/22 0247 08/22/22 0116 10/09/22 0736 10/23/22 1122 11/20/22 1329  NA 137   < > 144 142 138   < >  --    < > 140 139 136  K 4.1   < > 3.4* 4.5 4.1   < >  --    < > 4.0 4.2 4.2  CL 104   < > 114* 117* 109   < >  --    < > 106 106 103  CO2 22   < > 19* 20* 21*   < >  --    < > 27 24 23   GLUCOSE 104*   < > 124* 103* 97   < >  --    < > 90 121* 104*  BUN 29*   < > 32* 31* 33*   < >  --    < > 39* 45* 27*  CREATININE 1.58*   < > 1.58* 1.69* 1.68*   < >  --    < > 1.54* 1.50* 1.71*  CALCIUM 8.6*   < > 8.2* 7.9* 8.2*   < >  --    < > 9.6 9.8 9.3  MG 1.7  --  1.4* 2.1 2.0  --  1.7  --   --   --   --   PHOS 2.1*  --  3.2  --   --   --   --   --   --   --   --    < > = values in this interval not displayed.   Recent Labs    10/09/22 0736 10/23/22 1122 11/20/22 1329  AST 45* 55* 34  ALT 41 48* 26  ALKPHOS 118 153* 117  BILITOT 0.5 0.5 0.6  PROT 6.7 7.3 7.1  ALBUMIN 3.3* 3.4* 3.2*   Recent Labs    10/09/22 0736 10/23/22 1122 11/20/22 1329 12/04/22  0000  WBC 4.5 3.9* 3.9* 4.2  NEUTROABS 2.9 2.4 2.3 2,701.00  HGB 10.1* 12.1* 10.9* 9.3*  HCT 30.8* 36.5* 32.2* 28*  MCV 104.1* 104.9* 106.3*  --   PLT 154 190 177 90*   Lab Results  Component Value Date   TSH 0.05 (A) 12/08/2022   Lab Results  Component Value Date   HGBA1C 4.4 (L) 08/02/2022   No results found for: "CHOL", "HDL", "LDLCALC", "LDLDIRECT", "TRIG", "CHOLHDL"  Significant Diagnostic Results in last 30 days:  No results found.  Assessment/Plan  1. Hypothyroidism, unspecified type -  tsh 0.05, 12/08/22 -  will decrease Levothyroxine from 175 mcg daily tp Levothyroxine 150 mcg daily -  tsh in 6 weeks - levothyroxine (SYNTHROID) 150 MCG tablet; Take 1 tablet (150 mcg total) by mouth daily.  Dispense: 90 tablet; Refill: 3  2. History of GI bleed -  no bloody stool -  continue Sucralfate and Pantoprazole     Family/ staff Communication: Discussed plan of care  with resident and charge nurse.  Labs/tests ordered:  tsh in 6 weeks    Kenard Gower, DNP, MSN, FNP-BC [facility] [phone] (Monday-Friday 8:00 a.m. - 5:00 p.m.) [phone] (after hours)

## 2022-12-09 NOTE — Telephone Encounter (Signed)
Pt's daughter called stating that pt's labs (CBC w/Diff) was drawn on 12/04/2022 at [facility] and pt's Hbg 9.3.  Pt daughter stated the pt is asymptomatic for anemia but Dr. Mosetta Putt stated the last time pt was seen in clinic that if pt's Hbg drops below 10 she would restart the pt's Bevacizumab.  Pt's daughter stated the pt is currently scheduled to see Dr. Mosetta Putt on 12/18/2022 and want to know does Dr. Mosetta Putt want to see the pt before that date.  Pt's daughter also asked if Dr. Mosetta Putt would like labs redrawn this week as well at [facility].  Stated this RN will notify Dr. Mosetta Putt of the pt's call and concerns.  Stated this nurse will f/u with pt's daughter w/Dr Feng's response.

## 2022-12-10 DIAGNOSIS — G9341 Metabolic encephalopathy: Secondary | ICD-10-CM | POA: Diagnosis not present

## 2022-12-10 DIAGNOSIS — M6281 Muscle weakness (generalized): Secondary | ICD-10-CM | POA: Diagnosis not present

## 2022-12-10 DIAGNOSIS — R278 Other lack of coordination: Secondary | ICD-10-CM | POA: Diagnosis not present

## 2022-12-10 DIAGNOSIS — R1312 Dysphagia, oropharyngeal phase: Secondary | ICD-10-CM | POA: Diagnosis not present

## 2022-12-10 DIAGNOSIS — R499 Unspecified voice and resonance disorder: Secondary | ICD-10-CM | POA: Diagnosis not present

## 2022-12-10 DIAGNOSIS — R2681 Unsteadiness on feet: Secondary | ICD-10-CM | POA: Diagnosis not present

## 2022-12-10 DIAGNOSIS — R41841 Cognitive communication deficit: Secondary | ICD-10-CM | POA: Diagnosis not present

## 2022-12-11 DIAGNOSIS — M6281 Muscle weakness (generalized): Secondary | ICD-10-CM | POA: Diagnosis not present

## 2022-12-11 DIAGNOSIS — G9341 Metabolic encephalopathy: Secondary | ICD-10-CM | POA: Diagnosis not present

## 2022-12-11 DIAGNOSIS — R2681 Unsteadiness on feet: Secondary | ICD-10-CM | POA: Diagnosis not present

## 2022-12-11 DIAGNOSIS — R499 Unspecified voice and resonance disorder: Secondary | ICD-10-CM | POA: Diagnosis not present

## 2022-12-11 DIAGNOSIS — R1312 Dysphagia, oropharyngeal phase: Secondary | ICD-10-CM | POA: Diagnosis not present

## 2022-12-11 DIAGNOSIS — R41841 Cognitive communication deficit: Secondary | ICD-10-CM | POA: Diagnosis not present

## 2022-12-12 ENCOUNTER — Telehealth: Payer: Self-pay

## 2022-12-12 DIAGNOSIS — R2681 Unsteadiness on feet: Secondary | ICD-10-CM | POA: Diagnosis not present

## 2022-12-12 DIAGNOSIS — M6281 Muscle weakness (generalized): Secondary | ICD-10-CM | POA: Diagnosis not present

## 2022-12-12 DIAGNOSIS — G9341 Metabolic encephalopathy: Secondary | ICD-10-CM | POA: Diagnosis not present

## 2022-12-12 NOTE — Telephone Encounter (Signed)
Called the patients daughter to check up on the patient as per Dr. Georga Bora message below. She stated he is having no symptoms as this time and no signs of blood loss. She agreed to keeping the upcoming appointment and will let us know if he starts to have any symptoms of blood loss.   If pt has no signs of GI bleeding, OK to keep his appointments with [place] as scheduled. Let her daughter call back if he has GI bleeding.  Erie Noe, please add beva infusion appointment on 11/21, thanks  Malachy Mood

## 2022-12-15 DIAGNOSIS — R2681 Unsteadiness on feet: Secondary | ICD-10-CM | POA: Diagnosis not present

## 2022-12-15 DIAGNOSIS — M6281 Muscle weakness (generalized): Secondary | ICD-10-CM | POA: Diagnosis not present

## 2022-12-15 DIAGNOSIS — G9341 Metabolic encephalopathy: Secondary | ICD-10-CM | POA: Diagnosis not present

## 2022-12-16 ENCOUNTER — Other Ambulatory Visit: Payer: Self-pay | Admitting: Hematology

## 2022-12-16 DIAGNOSIS — R278 Other lack of coordination: Secondary | ICD-10-CM | POA: Diagnosis not present

## 2022-12-16 DIAGNOSIS — M6281 Muscle weakness (generalized): Secondary | ICD-10-CM | POA: Diagnosis not present

## 2022-12-16 DIAGNOSIS — K5521 Angiodysplasia of colon with hemorrhage: Secondary | ICD-10-CM

## 2022-12-16 DIAGNOSIS — R499 Unspecified voice and resonance disorder: Secondary | ICD-10-CM | POA: Diagnosis not present

## 2022-12-16 DIAGNOSIS — R41841 Cognitive communication deficit: Secondary | ICD-10-CM | POA: Diagnosis not present

## 2022-12-16 DIAGNOSIS — R1312 Dysphagia, oropharyngeal phase: Secondary | ICD-10-CM | POA: Diagnosis not present

## 2022-12-16 DIAGNOSIS — G9341 Metabolic encephalopathy: Secondary | ICD-10-CM | POA: Diagnosis not present

## 2022-12-17 DIAGNOSIS — R41841 Cognitive communication deficit: Secondary | ICD-10-CM | POA: Diagnosis not present

## 2022-12-17 DIAGNOSIS — R499 Unspecified voice and resonance disorder: Secondary | ICD-10-CM | POA: Diagnosis not present

## 2022-12-17 DIAGNOSIS — R278 Other lack of coordination: Secondary | ICD-10-CM | POA: Diagnosis not present

## 2022-12-17 DIAGNOSIS — R1312 Dysphagia, oropharyngeal phase: Secondary | ICD-10-CM | POA: Diagnosis not present

## 2022-12-17 DIAGNOSIS — M6281 Muscle weakness (generalized): Secondary | ICD-10-CM | POA: Diagnosis not present

## 2022-12-17 DIAGNOSIS — R2681 Unsteadiness on feet: Secondary | ICD-10-CM | POA: Diagnosis not present

## 2022-12-17 DIAGNOSIS — G9341 Metabolic encephalopathy: Secondary | ICD-10-CM | POA: Diagnosis not present

## 2022-12-18 ENCOUNTER — Inpatient Hospital Stay: Payer: PPO

## 2022-12-18 ENCOUNTER — Inpatient Hospital Stay: Payer: PPO | Attending: Physician Assistant | Admitting: Hematology

## 2022-12-18 ENCOUNTER — Encounter: Payer: Self-pay | Admitting: Hematology

## 2022-12-18 VITALS — BP 121/69 | HR 80 | Resp 18

## 2022-12-18 VITALS — BP 118/67 | HR 72 | Temp 98.2°F | Ht 68.0 in | Wt 178.7 lb

## 2022-12-18 DIAGNOSIS — D500 Iron deficiency anemia secondary to blood loss (chronic): Secondary | ICD-10-CM

## 2022-12-18 DIAGNOSIS — R499 Unspecified voice and resonance disorder: Secondary | ICD-10-CM | POA: Diagnosis not present

## 2022-12-18 DIAGNOSIS — J90 Pleural effusion, not elsewhere classified: Secondary | ICD-10-CM | POA: Diagnosis not present

## 2022-12-18 DIAGNOSIS — R41841 Cognitive communication deficit: Secondary | ICD-10-CM | POA: Diagnosis not present

## 2022-12-18 DIAGNOSIS — C241 Malignant neoplasm of ampulla of Vater: Secondary | ICD-10-CM | POA: Insufficient documentation

## 2022-12-18 DIAGNOSIS — Z5112 Encounter for antineoplastic immunotherapy: Secondary | ICD-10-CM | POA: Insufficient documentation

## 2022-12-18 DIAGNOSIS — D519 Vitamin B12 deficiency anemia, unspecified: Secondary | ICD-10-CM

## 2022-12-18 DIAGNOSIS — K5521 Angiodysplasia of colon with hemorrhage: Secondary | ICD-10-CM

## 2022-12-18 DIAGNOSIS — R1312 Dysphagia, oropharyngeal phase: Secondary | ICD-10-CM | POA: Diagnosis not present

## 2022-12-18 DIAGNOSIS — G9341 Metabolic encephalopathy: Secondary | ICD-10-CM | POA: Diagnosis not present

## 2022-12-18 DIAGNOSIS — D649 Anemia, unspecified: Secondary | ICD-10-CM

## 2022-12-18 LAB — CBC WITH DIFFERENTIAL/PLATELET
Abs Immature Granulocytes: 0.01 10*3/uL (ref 0.00–0.07)
Basophils Absolute: 0 10*3/uL (ref 0.0–0.1)
Basophils Relative: 0 %
Eosinophils Absolute: 0.1 10*3/uL (ref 0.0–0.5)
Eosinophils Relative: 3 %
HCT: 29.3 % — ABNORMAL LOW (ref 39.0–52.0)
Hemoglobin: 9.6 g/dL — ABNORMAL LOW (ref 13.0–17.0)
Immature Granulocytes: 0 %
Lymphocytes Relative: 19 %
Lymphs Abs: 0.8 10*3/uL (ref 0.7–4.0)
MCH: 34.3 pg — ABNORMAL HIGH (ref 26.0–34.0)
MCHC: 32.8 g/dL (ref 30.0–36.0)
MCV: 104.6 fL — ABNORMAL HIGH (ref 80.0–100.0)
Monocytes Absolute: 0.6 10*3/uL (ref 0.1–1.0)
Monocytes Relative: 14 %
Neutro Abs: 2.9 10*3/uL (ref 1.7–7.7)
Neutrophils Relative %: 64 %
Platelets: 168 10*3/uL (ref 150–400)
RBC: 2.8 MIL/uL — ABNORMAL LOW (ref 4.22–5.81)
RDW: 16.9 % — ABNORMAL HIGH (ref 11.5–15.5)
WBC: 4.4 10*3/uL (ref 4.0–10.5)
nRBC: 0 % (ref 0.0–0.2)

## 2022-12-18 LAB — COMPREHENSIVE METABOLIC PANEL
ALT: 53 U/L — ABNORMAL HIGH (ref 0–44)
AST: 51 U/L — ABNORMAL HIGH (ref 15–41)
Albumin: 3.2 g/dL — ABNORMAL LOW (ref 3.5–5.0)
Alkaline Phosphatase: 128 U/L — ABNORMAL HIGH (ref 38–126)
Anion gap: 7 (ref 5–15)
BUN: 28 mg/dL — ABNORMAL HIGH (ref 8–23)
CO2: 26 mmol/L (ref 22–32)
Calcium: 9.5 mg/dL (ref 8.9–10.3)
Chloride: 106 mmol/L (ref 98–111)
Creatinine, Ser: 1.34 mg/dL — ABNORMAL HIGH (ref 0.61–1.24)
GFR, Estimated: 51 mL/min — ABNORMAL LOW (ref >60)
Glucose, Bld: 115 mg/dL — ABNORMAL HIGH (ref 70–99)
Potassium: 4.1 mmol/L (ref 3.5–5.1)
Sodium: 139 mmol/L (ref 135–145)
Total Bilirubin: 0.5 mg/dL (ref <1.2)
Total Protein: 6.5 g/dL (ref 6.5–8.1)

## 2022-12-18 LAB — FERRITIN: Ferritin: 166 ng/mL (ref 24–336)

## 2022-12-18 LAB — SAMPLE TO BLOOD BANK

## 2022-12-18 MED ORDER — SODIUM CHLORIDE 0.9% FLUSH: 10.0000 mL | INTRAVENOUS | Status: DC | PRN

## 2022-12-18 MED ORDER — SODIUM CHLORIDE 0.9 % IV SOLN: Freq: Once | INTRAVENOUS | Status: AC

## 2022-12-18 MED ORDER — SODIUM CHLORIDE 0.9 % IV SOLN
5.0000 mg/kg | Freq: Once | INTRAVENOUS | Status: AC
  Administered 2022-12-18: 400 mg via INTRAVENOUS
  Filled 2022-12-18: qty 16

## 2022-12-18 MED ORDER — HEPARIN SOD (PORK) LOCK FLUSH 100 UNIT/ML IV SOLN
500.0000 [IU] | Freq: Once | INTRAVENOUS | Status: DC | PRN
Start: 2022-12-18 — End: 2022-12-18

## 2022-12-18 MED ORDER — CYANOCOBALAMIN 1000 MCG/ML IJ SOLN
1000.0000 ug | Freq: Once | INTRAMUSCULAR | Status: AC
  Administered 2022-12-18: 1000 ug via INTRAMUSCULAR
  Filled 2022-12-18: qty 1

## 2022-12-18 NOTE — Assessment & Plan Note (Signed)
-  cTxN0M0, diagnosed in 12/2021, MMR normal  -I reviewed PET scan findings, which showed no evidence of distant metastasis  --I reviewed his molecular testing, MMR was normal, he is not a candidate for immunotherapy.  Foundation One showed no targetable mutations. -He is finishing RT on 04/09/2022 -Due to advanced age, I do not plan to offer him chemotherapy -CT scan in June 2024 showed no evidence of residual disease or new metastasis, although CT is not ideal to evaluate the residual disease in his case.

## 2022-12-18 NOTE — Patient Instructions (Signed)
[facility] - A DEPT OF [facility]  Discharge Instructions: Thank you for choosing [facility] to provide your oncology and hematology care.   If you have a lab appointment with the Cancer Center, please go directly to the Cancer Center and check in at the registration area.   Wear comfortable clothing and clothing appropriate for easy access to any Portacath or PICC line.   We strive to give you quality time with your provider. You may need to reschedule your appointment if you arrive late (15 or more minutes).  Arriving late affects you and other patients whose appointments are after yours.  Also, if you miss three or more appointments without notifying the office, you may be dismissed from the clinic at the provider's discretion.      For prescription refill requests, have your pharmacy contact our office and allow 72 hours for refills to be completed.    Today you received the following chemotherapy and/or immunotherapy agents: Vegzelma.       To help prevent nausea and vomiting after your treatment, we encourage you to take your nausea medication as directed.  BELOW ARE SYMPTOMS THAT SHOULD BE REPORTED IMMEDIATELY: *FEVER GREATER THAN 100.4 F (38 C) OR HIGHER *CHILLS OR SWEATING *NAUSEA AND VOMITING THAT IS NOT CONTROLLED WITH YOUR NAUSEA MEDICATION *UNUSUAL SHORTNESS OF BREATH *UNUSUAL BRUISING OR BLEEDING *URINARY PROBLEMS (pain or burning when urinating, or frequent urination) *BOWEL PROBLEMS (unusual diarrhea, constipation, pain near the anus) TENDERNESS IN MOUTH AND THROAT WITH OR WITHOUT PRESENCE OF ULCERS (sore throat, sores in mouth, or a toothache) UNUSUAL RASH, SWELLING OR PAIN  UNUSUAL VAGINAL DISCHARGE OR ITCHING   Items with * indicate a potential emergency and should be followed up as soon as possible or go to the Emergency Department if any problems should occur.  Please show the CHEMOTHERAPY ALERT CARD or  IMMUNOTHERAPY ALERT CARD at check-in to the Emergency Department and triage nurse.  Should you have questions after your visit or need to cancel or reschedule your appointment, please contact [facility] - A DEPT OF [facility]  Dept: [phone]  and follow the prompts.  Office hours are 8:00 a.m. to 4:30 p.m. Monday - Friday. Please note that voicemails left after 4:00 p.m. may not be returned until the following business day.  We are closed weekends and major holidays. You have access to a nurse at all times for urgent questions. Please call the main number to the clinic Dept: [phone] and follow the prompts.   For any non-urgent questions, you may also contact your provider using MyChart. We now offer e-Visits for anyone 28 and older to request care online for non-urgent symptoms. For details visit [email or web address].   Also download the MyChart app! Go to the app store, search "MyChart", open the app, select [place], and log in with your MyChart username and password.

## 2022-12-18 NOTE — Assessment & Plan Note (Signed)
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  -He has required multiple blood transfusions -We discussed the benefit and side effect of bevacizumab for AVM related GI bleeding, he started on 09/11/22, he received every 2 weeks for total of 4 cycles. -due to good response to beva and persistent anemia, will continue beva every 4 weeks as maintenance therapy

## 2022-12-18 NOTE — Progress Notes (Signed)
[facility]   [phone] Fax:([phone]   Clinic Follow up Note   Patient Care Team: [name], MD as PCP - General (Internal Medicine) [name], MD as Consulting Physician (General Surgery) [name], MD as Consulting Physician (Gastroenterology) [name], MD as Consulting Physician (Otolaryngology) [name], MD as Consulting Physician (Cardiology) Suzi Roots as Physician Assistant (Dermatology) Malachy Mood, MD as Consulting Physician (Oncology)  Date of Service:  12/18/2022  CHIEF COMPLAINT: f/u of anemia and duodenal cancer   CURRENT THERAPY:  Avastin   Oncology History   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  Foundation One showed no targetable mutations. -He is finishing RT on 04/09/2022 -Due to advanced age, I do not plan to offer him chemotherapy -CT scan in June 2024 showed no evidence of residual disease or new metastasis, although CT is not ideal to evaluate the residual disease in his case.        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  -He has required multiple blood transfusions -We discussed the benefit and side effect of bevacizumab for AVM related GI bleeding, he started on 09/11/22, he received every 2 weeks for total of 4 cycles. -due to good response to beva and persistent anemia, will continue beva every 4 weeks as maintenance therapy      Assessment and Plan    Anemia Chronic anemia with recent hemoglobin drop to 9.6 g/dL from 14.7 g/dL. No signs of gastrointestinal bleeding. Normal bowel movements, no  abdominal pain. Appetite generally adequate. Previous dark stools during hospitalization resolved. Maintenance therapy with romosozumab well tolerated. Discussed benefits of maintenance therapy and plan to administer romosozumab every 4-8 weeks. Emphasized importance of hydration before blood draws. - Administer romosozumab infusion every 4-8 weeks - Monitor hemoglobin levels biweekly at the nursing home - Ensure adequate hydration before blood draws  Duodenal adenocarcinoma Well-managed with no new symptoms. Last CT scan in June showed no progression. No need for repeat scope unless new symptoms arise due to his advanced age. Eating well, no pain.  Next CT scan in 6-12 months if clinically indicated.   General Health Maintenance Residing in a nursing home, receiving physical therapy. No recent falls. Uses walker and wheelchair for mobility. Anxiety about balance improving with therapy. Discussed importance of continuing physical therapy and using walker. Monitor for dehydration and ensure adequate fluid intake. - Continue physical therapy at the nursing home - Encourage use of walker for mobility - Monitor for dehydration and ensure adequate fluid intake   Plan -Lab reviewed, hemoglobin 9.6, overall stable -Will proceed with bevacizumab today and continue every 4 weeks -He will have CBC checked at nursing home in between his visits here -Follow-up in 4 weeks        SUMMARY OF ONCOLOGIC HISTORY: Oncology History  Cancer of ampulla of Vater (HCC)  12/06/2021 Imaging   CT ABDOMEN PELVIS W CONTRAST   IMPRESSION: 1. There is moderate intrahepatic bile duct dilatation with marked fusiform dilatation of the common bile duct. No CT visible common bile duct stones identified. Differential considerations include a obstructing distal common bile  duct stone (not visible by CT), distal CBD stricture, or mass at the level of the ampullary. Consider further evaluation with contrast enhanced  MRI/MRCP or ERCP. 2. Contour the liver appears nodular which may reflect underlying cirrhosis. 3. Age-indeterminate compression fracture involving L1 vertebral body with loss of 50% of the vertebral body height. No signs of retropulsion of fracture fragments into the canal. 4. Fat containing umbilical hernia. 5. 5 mm anterolisthesis of L4 on L5. 6.  Aortic Atherosclerosis (ICD10-I70.0).   12/16/2021 Imaging   MR ABDOMEN MRCP W WO CONTAST   IMPRESSION: 1. Moderate intrahepatic and extrahepatic bile duct dilation with smooth tapering to the level of the ampulla without filling defect or focal mass lesion identified. Findings may reflect ampullary stricture or occult ampullary mass. Consider further evaluation with ERCP. 2. Mild dilated main pancreatic duct at the head measuring up to 8 mm. 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 Recommend follow-up pre and post-contrast MRI/MRCP in 2 years. This recommendation follows ACR consensus guidelines: Management of Incidental Pancreatic Cysts: A White Paper of the ACR Incidental Findings Committee. J Am Coll Radiol 2017;14:911-923. 3.  Aortic Atherosclerosis (ICD10-I70.0).   01/23/2022 Procedure   DG ERCP   IMPRESSION: Dilatation of the extrahepatic bile duct with severe tapering or narrowing in the distal common bile duct. Placement of biliary stent.   These images were submitted for radiologic interpretation only. Please see the procedural report for the amount of contrast.    01/23/2022 Procedure   EUS/ERCP  ENDOSONOGRAPHIC FINDING: There was dilation in the common bile duct (12.3 mm -> 20.4 mm) and in the common hepatic duct (23.0 mm). A small amount of hyperechoic material consistent with sludge was visualized endosonographically in the common bile duct. Moderate hyperechoic material consistent with sludge was visualized  endosonographically in the gallbladder with normal gallbladder wall thickness. Pancreatic parenchymal abnormalities were noted in the entire pancreas. These consisted of hyperechoic foci with shadowing and cysts. There was a 9.1 mm by 6.1 mm cyst in the neck of the pancreas. There was a 15.3 mm by 14.1 mm cyst in the tail of the pancreas. The pancreatic duct had a dilated endosonographic appearance, had a prominently branched endosonographic appearance and had hyperechoic walls in the pancreatic head (PD - 8.2 mm -> 4.3 mm), genu of the pancreas (3.5 mm), body of the pancreas (3.2 mm) and tail of the pancreas (2.8 mm). A hypoechoic irregular lesion was identified endosonographically at the ampulla. The lesion measured 15 mm by 13 mm in maximal cross-sectional diameter. The lesion extended from the mucosa to the submucosa. The outer margins were irregular. This is noted where CBD and PD dilation is noted to occur. Endosonographic imaging in the visualized portion of the liver showed no mass.  No malignant-appearing lymph nodes were visualized in the celiac region (level 20), peripancreatic region and porta hepatis region. The celiac region was visualized.   01/23/2022 Pathology Results   SURGICAL PATHOLOGY  CASE: [id number]  PATIENT: Carl Woods  Surgical Pathology Report   FINAL MICROSCOPIC DIAGNOSIS:   A. STOMACH, RANDOM, BIOPSY:  - Gastric mucosa, no significant abnormality.  No inflammation,  intestinal metaplasia, dysplasia or malignancy.   B. AMPULLARY LESION, BIOPSY:  - Adenocarcinoma.  See comment.    01/31/2022 Initial Diagnosis   Cancer of ampulla of Vater (HCC)   02/07/2022 Imaging    IMPRESSION: Interval increase in size of lymph node in the anterior  cardiophrenic space on the right.   Increase small right pleural effusion compared to the previous MRI.   Subtle nodular tissue along the margin of the right hepatic lobe in the upper abdomen posteriorly. Recommend  additional workup when appropriate.   Calcified pleural plaques.   Evidence of chronic liver disease.   Aortic Atherosclerosis (ICD10-I70.0).   02/14/2022 Miscellaneous   Foundation One:  Biomarker Findings Microsatellite status-Cannot be determined Tumor Mutation Burden-Cannot be determined  Genomics Findings EPHB4 amplificatio SF3B1 [id number] [id number]       Discussed the use of AI scribe software for clinical note transcription with the patient, who gave verbal consent to proceed.  History of Present Illness   The patient, an 89 year old male with a history of anemia and urinal adenocarcinoma, presents for a follow-up visit. The patient's family had expressed concern about his dropping hemoglobin levels, which had been noted to be 9.4 or 9.6 two weeks ago, down from 10. The patient, however, reports no noticeable changes in his health status. He denies any recent episodes of bloody or dark stools, which he had experienced during a previous hospital stay. His bowel movements are regular, and he reports no abdominal pain. The patient's appetite has been somewhat reduced due to unappealing menu options at his nursing home, but he is generally eating most of his meals. The patient also reports a recent fall at the nursing home, which he attributes to slipping due to not wearing socks. He has been receiving physical therapy and is able to move around with a walker, although he still experiences some anxiety about his balance.         All other systems were reviewed with the patient and are negative.  MEDICAL HISTORY:  Past Medical History:  Diagnosis Date   Anal fistula    Arthritis    Fingers and hands   Atrial fibrillation (HCC)    AVM (arteriovenous malformation)    Clipped during Colonoscopy 05/2016   Barrett's esophagus    Bilateral cataracts    BPH (benign prostatic hyperplasia)    Cecal angiodysplasia 05/28/2016   ablated at colonoscopy   Deviated nasal septum     Diverticulosis of sigmoid colon    E. 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   Left Forehead (treatment after biopsy)   OSA on CPAP    Perianal rash    Recurrent epistaxis    Renal cyst 01/04/2013   Small left peripelvic renal cysts , noted on US Renal   SCCA (squamous cell carcinoma) of skin 10/17/2015   Left Sup Bridge of Nose (curet, cautery and 5FU)   Seasonal allergies    Superficial basal cell carcinoma (BCC) 10/17/2015   Left Bulb of Nose (curet, cautery and 5FU)   Tubular adenoma     SURGICAL HISTORY: Past Surgical History:  Procedure Laterality Date   BILIARY STENT PLACEMENT N/A 01/23/2022   Procedure: BILIARY STENT PLACEMENT;  Surgeon: Lemar Lofty., MD;  Location: [facility];  Service: Gastroenterology;  Laterality: N/A;   BILIARY STENT PLACEMENT N/A 08/06/2022   Procedure: BILIARY STENT PLACEMENT;  Surgeon: Lynann Bologna, MD;  Location: [facility];  Service: Gastroenterology;  Laterality: N/A;   BIOPSY  01/23/2022   Procedure: BIOPSY;  Surgeon: Meridee Score Netty Starring., MD;  Location: [facility];  Service: Gastroenterology;;  BIOPSY  07/09/2022   Procedure: BIOPSY;  Surgeon: Shellia Cleverly, DO;  Location: [facility];  Service: Gastroenterology;;   BIOPSY  08/02/2022   Procedure: BIOPSY;  Surgeon: Sherrilyn Rist, MD;  Location: [facility];  Service: Gastroenterology;;   COLONOSCOPY  multiple   CYSTOSCOPY     ENDOSCOPIC RETROGRADE CHOLANGIOPANCREATOGRAPHY (ERCP) WITH PROPOFOL N/A 01/23/2022   Procedure: ENDOSCOPIC RETROGRADE CHOLANGIOPANCREATOGRAPHY (ERCP) WITH PROPOFOL;  Surgeon: Lemar Lofty., MD;  Location: [facility];  Service: Gastroenterology;  Laterality: N/A;   ENTEROSCOPY N/A 08/02/2022   Procedure: ENTEROSCOPY;  Surgeon: Sherrilyn Rist, MD;  Location: [facility];   Service: Gastroenterology;  Laterality: N/A;   ERCP N/A 08/06/2022   Procedure: ENDOSCOPIC RETROGRADE CHOLANGIOPANCREATOGRAPHY (ERCP);  Surgeon: Lynann Bologna, MD;  Location: [facility];  Service: Gastroenterology;  Laterality: N/A;   ESOPHAGOGASTRODUODENOSCOPY  multiple   ESOPHAGOGASTRODUODENOSCOPY N/A 01/23/2022   Procedure: ESOPHAGOGASTRODUODENOSCOPY (EGD);  Surgeon: Lemar Lofty., MD;  Location: [facility];  Service: Gastroenterology;  Laterality: N/A;   ESOPHAGOGASTRODUODENOSCOPY N/A 07/09/2022   Procedure: ESOPHAGOGASTRODUODENOSCOPY (EGD);  Surgeon: Shellia Cleverly, DO;  [facility];  Service: Gastroenterology;  Laterality: N/A;   ESOPHAGOGASTRODUODENOSCOPY N/A 08/21/2022   Procedure: ESOPHAGOGASTRODUODENOSCOPY (EGD);  Surgeon: Jenel Lucks, MD;  Location: [facility] ENDOSCOPY;  Service: Gastroenterology;  Laterality: N/A;   EUS N/A 01/23/2022   Procedure: UPPER ENDOSCOPIC ULTRASOUND (EUS) RADIAL;  Surgeon: Lemar Lofty., MD;  Location: [facility];  Service: Gastroenterology;  Laterality: N/A;   EVALUATION UNDER ANESTHESIA WITH FISTULECTOMY N/A 03/02/2018   Procedure: ANORECTAL EXAM UNDER ANESTHESIA WITH REPAIR OF SUPERFICIAL PERIRECTAL FISTULA AND HEMORRHOIDECTOMY;  Surgeon: [name], MD;  Location: [facility];  Service: General;  Laterality: N/A;   EXPLORATORY LAPAROTOMY     HEMOSTASIS CLIP PLACEMENT  07/09/2022   Procedure: HEMOSTASIS CLIP PLACEMENT;  Surgeon: Shellia Cleverly, DO;  Location: [facility];  Service: Gastroenterology;;   HOT HEMOSTASIS N/A 08/02/2022   Procedure: HOT HEMOSTASIS (ARGON PLASMA COAGULATION/BICAP);  Surgeon: Sherrilyn Rist, MD;  Location: [facility];  Service: Gastroenterology;  Laterality: N/A;   HOT HEMOSTASIS N/A 08/21/2022   Procedure: HOT HEMOSTASIS (ARGON PLASMA COAGULATION/BICAP);  Surgeon: Jenel Lucks, MD;  Location: [facility] ENDOSCOPY;  Service: Gastroenterology;  Laterality: N/A;   IR THORACENTESIS ASP  PLEURAL SPACE W/IMG GUIDE  07/10/2022   REMOVAL OF STONES  01/23/2022   Procedure: REMOVAL OF STONES;  Surgeon: Meridee Score Netty Starring., MD;  Location: [facility];  Service: Gastroenterology;;   REMOVAL OF STONES  08/06/2022   Procedure: REMOVAL OF STONES;  Surgeon: Lynann Bologna, MD;  Location: [facility];  Service: Gastroenterology;;   Dennison Mascot  01/23/2022   Procedure: Dennison Mascot;  Surgeon: Mansouraty, Netty Starring., MD;  Location: [facility];  Service: Gastroenterology;;    I have reviewed the social history and family history with the patient and they are unchanged from previous note.  ALLERGIES:  is allergic to nsaids.  MEDICATIONS:  Current Outpatient Medications  Medication Sig Dispense Refill   acetaminophen (TYLENOL) 325 MG tablet Take 650 mg by mouth every 6 (six) hours as needed for fever or moderate pain (pain score 4-6).     bumetanide (BUMEX) 1 MG tablet Take 1 tablet (1 mg total) by mouth daily.     cetirizine (ZYRTEC) 10 MG tablet Take 10 mg by mouth as needed for allergies.     Cholecalciferol (VITAMIN D3) 5000 units CAPS Take 5,000 Units by mouth every morning.     dapagliflozin propanediol (FARXIGA) 5 MG TABS  tablet Take 1 tablet (5 mg total) by mouth in the morning.     Glucosamine Sulfate 500 MG TABS Take 500 mg by mouth daily.     levothyroxine (SYNTHROID) 150 MCG tablet Take 1 tablet (150 mcg total) by mouth daily. 90 tablet 3   Nutritional Supplements (BOOST PLUS PO) Take 1 Can by mouth once. 1 can orally one time a day for weight loss     nystatin cream (MYCOSTATIN) Apply 1 Application topically every 8 (eight) hours as needed (Yeast).     olopatadine (PATANOL) 0.1 % ophthalmic solution Place 1 drop into both eyes daily as needed for allergies.     ondansetron (ZOFRAN) 8 MG tablet Take 1 tablet (8 mg total) by mouth every 8 (eight) hours as needed for nausea or vomiting. 20 tablet 0   pantoprazole (PROTONIX) 40 MG tablet Take 1 tablet (40 mg total) by  mouth 2 (two) times daily.     potassium chloride SA (KLOR-CON M) 20 MEQ tablet Take 1 tablet (20 mEq total) by mouth 2 (two) times daily. 60 tablet 0   sodium chloride (OCEAN) 0.65 % SOLN nasal spray Place 1 spray into both nostrils as needed for congestion.     sucralfate (CARAFATE) 1 GM/10ML suspension Take 10 mLs (1 g total) by mouth 4 (four) times daily -  with meals and at bedtime.     Zinc Oxide 10 % OINT Apply 1 Application topically as needed.     No current facility-administered medications for this visit.   Facility-Administered Medications Ordered in Other Visits  Medication Dose Route Frequency Provider Last Rate Last Admin   heparin lock flush 100 unit/mL  500 Units Intracatheter Once PRN Malachy Mood, MD       sodium chloride flush (NS) 0.9 % injection 10 mL  10 mL Intracatheter PRN Malachy Mood, MD        PHYSICAL EXAMINATION: ECOG PERFORMANCE STATUS: 3 - Symptomatic, >50% confined to bed  Vitals:   12/18/22 1312  BP: 118/67  Pulse: 72  Temp: 98.2 F (36.8 C)  SpO2: 100%   Wt Readings from Last 3 Encounters:  12/18/22 178 lb 11.2 oz (81.1 kg)  12/09/22 171 lb 11.2 oz (77.9 kg)  11/26/22 171 lb 6.4 oz (77.7 kg)     GENERAL:alert, no distress and comfortable SKIN: skin color, texture, turgor are normal, no rashes or significant lesions EYES: normal, Conjunctiva are pink and non-injected, sclera clear NECK: supple, thyroid normal size, non-tender, without nodularity LYMPH:  no palpable lymphadenopathy in the cervical, axillary  LUNGS: clear to auscultation and percussion with normal breathing effort HEART: regular rate & rhythm and no murmurs and no lower extremity edema ABDOMEN:abdomen soft, non-tender and normal bowel sounds Musculoskeletal:no cyanosis of digits and no clubbing  NEURO: alert & oriented x 3 with fluent speech, no focal motor/sensory deficits   LABORATORY DATA:  I have reviewed the data as listed    Latest Ref Rng & Units 12/18/2022   12:22 PM  12/04/2022   12:00 AM 11/20/2022    1:29 PM  CBC  WBC 4.0 - 10.5 K/uL 4.4  4.2     3.9   Hemoglobin 13.0 - 17.0 g/dL 9.6  9.3     40.9   Hematocrit 39.0 - 52.0 % 29.3  28     32.2   Platelets 150 - 400 K/uL 168  90     177      This result is from an external source.  Latest Ref Rng & Units 12/18/2022   12:22 PM 11/20/2022    1:29 PM 10/23/2022   11:22 AM  CMP  Glucose 70 - 99 mg/dL 161  096  045   BUN 8 - 23 mg/dL 28  27  45   Creatinine 0.61 - 1.24 mg/dL 4.09  8.11  9.14   Sodium 135 - 145 mmol/L 139  136  139   Potassium 3.5 - 5.1 mmol/L 4.1  4.2  4.2   Chloride 98 - 111 mmol/L 106  103  106   CO2 22 - 32 mmol/L 26  23  24    Calcium 8.9 - 10.3 mg/dL 9.5  9.3  9.8   Total Protein 6.5 - 8.1 g/dL 6.5  7.1  7.3   Total Bilirubin <1.2 mg/dL 0.5  0.6  0.5   Alkaline Phos 38 - 126 U/L 128  117  153   AST 15 - 41 U/L 51  34  55   ALT 0 - 44 U/L 53  26  48       RADIOGRAPHIC STUDIES: I have personally reviewed the radiological images as listed and agreed with the findings in the report. No results found.    Orders Placed This Encounter  Procedures   CBC with Differential (Cancer Center Only)    Standing Status:   Future    Standing Expiration Date:   01/15/2024   CBC with Differential (Cancer Center Only)    Standing Status:   Future    Standing Expiration Date:   02/12/2024   Comprehensive metabolic panel    Standing Status:   Standing    Number of Occurrences:   50    Standing Expiration Date:   12/18/2023   All questions were answered. The patient knows to call the clinic with any problems, questions or concerns. No barriers to learning was detected. The total time spent in the appointment was 25 minutes.     Malachy Mood, MD 12/18/2022

## 2022-12-19 ENCOUNTER — Other Ambulatory Visit: Payer: Self-pay

## 2022-12-19 DIAGNOSIS — R1312 Dysphagia, oropharyngeal phase: Secondary | ICD-10-CM | POA: Diagnosis not present

## 2022-12-19 DIAGNOSIS — R2681 Unsteadiness on feet: Secondary | ICD-10-CM | POA: Diagnosis not present

## 2022-12-19 DIAGNOSIS — M6281 Muscle weakness (generalized): Secondary | ICD-10-CM | POA: Diagnosis not present

## 2022-12-19 DIAGNOSIS — R41841 Cognitive communication deficit: Secondary | ICD-10-CM | POA: Diagnosis not present

## 2022-12-19 DIAGNOSIS — R499 Unspecified voice and resonance disorder: Secondary | ICD-10-CM | POA: Diagnosis not present

## 2022-12-19 DIAGNOSIS — G9341 Metabolic encephalopathy: Secondary | ICD-10-CM | POA: Diagnosis not present

## 2022-12-19 LAB — CANCER ANTIGEN 19-9: CA 19-9: 45 U/mL — ABNORMAL HIGH (ref 0–35)

## 2022-12-20 ENCOUNTER — Other Ambulatory Visit: Payer: Self-pay

## 2022-12-22 DIAGNOSIS — M6281 Muscle weakness (generalized): Secondary | ICD-10-CM | POA: Diagnosis not present

## 2022-12-22 DIAGNOSIS — R278 Other lack of coordination: Secondary | ICD-10-CM | POA: Diagnosis not present

## 2022-12-22 DIAGNOSIS — G9341 Metabolic encephalopathy: Secondary | ICD-10-CM | POA: Diagnosis not present

## 2022-12-22 DIAGNOSIS — R2681 Unsteadiness on feet: Secondary | ICD-10-CM | POA: Diagnosis not present

## 2022-12-23 DIAGNOSIS — R499 Unspecified voice and resonance disorder: Secondary | ICD-10-CM | POA: Diagnosis not present

## 2022-12-23 DIAGNOSIS — R1312 Dysphagia, oropharyngeal phase: Secondary | ICD-10-CM | POA: Diagnosis not present

## 2022-12-23 DIAGNOSIS — R41841 Cognitive communication deficit: Secondary | ICD-10-CM | POA: Diagnosis not present

## 2022-12-23 DIAGNOSIS — R278 Other lack of coordination: Secondary | ICD-10-CM | POA: Diagnosis not present

## 2022-12-23 DIAGNOSIS — M6281 Muscle weakness (generalized): Secondary | ICD-10-CM | POA: Diagnosis not present

## 2022-12-23 DIAGNOSIS — G9341 Metabolic encephalopathy: Secondary | ICD-10-CM | POA: Diagnosis not present

## 2022-12-24 ENCOUNTER — Encounter: Payer: Self-pay | Admitting: Orthopedic Surgery

## 2022-12-24 ENCOUNTER — Non-Acute Institutional Stay (SKILLED_NURSING_FACILITY): Payer: PPO | Admitting: Orthopedic Surgery

## 2022-12-24 DIAGNOSIS — Z8719 Personal history of other diseases of the digestive system: Secondary | ICD-10-CM

## 2022-12-24 DIAGNOSIS — R278 Other lack of coordination: Secondary | ICD-10-CM | POA: Diagnosis not present

## 2022-12-24 DIAGNOSIS — G4733 Obstructive sleep apnea (adult) (pediatric): Secondary | ICD-10-CM | POA: Diagnosis not present

## 2022-12-24 DIAGNOSIS — E039 Hypothyroidism, unspecified: Secondary | ICD-10-CM | POA: Diagnosis not present

## 2022-12-24 DIAGNOSIS — Q2730 Arteriovenous malformation, site unspecified: Secondary | ICD-10-CM | POA: Diagnosis not present

## 2022-12-24 DIAGNOSIS — R1312 Dysphagia, oropharyngeal phase: Secondary | ICD-10-CM | POA: Diagnosis not present

## 2022-12-24 DIAGNOSIS — I4821 Permanent atrial fibrillation: Secondary | ICD-10-CM

## 2022-12-24 DIAGNOSIS — R634 Abnormal weight loss: Secondary | ICD-10-CM | POA: Diagnosis not present

## 2022-12-24 DIAGNOSIS — G9341 Metabolic encephalopathy: Secondary | ICD-10-CM | POA: Diagnosis not present

## 2022-12-24 DIAGNOSIS — C241 Malignant neoplasm of ampulla of Vater: Secondary | ICD-10-CM

## 2022-12-24 DIAGNOSIS — N1832 Chronic kidney disease, stage 3b: Secondary | ICD-10-CM

## 2022-12-24 DIAGNOSIS — I10 Essential (primary) hypertension: Secondary | ICD-10-CM

## 2022-12-24 DIAGNOSIS — R2681 Unsteadiness on feet: Secondary | ICD-10-CM | POA: Diagnosis not present

## 2022-12-24 DIAGNOSIS — M6281 Muscle weakness (generalized): Secondary | ICD-10-CM | POA: Diagnosis not present

## 2022-12-24 DIAGNOSIS — R41841 Cognitive communication deficit: Secondary | ICD-10-CM | POA: Diagnosis not present

## 2022-12-24 DIAGNOSIS — R499 Unspecified voice and resonance disorder: Secondary | ICD-10-CM | POA: Diagnosis not present

## 2022-12-24 NOTE — Progress Notes (Signed)
Location:  [facility] Room Number: 9/A Place of Service:  SNF [phone]) Provider:  Octavia Heir, NP   [name], MD  Patient Care Team: [name], MD as PCP - General (Internal Medicine) [name], MD as Consulting Physician (General Surgery) [name], MD as Consulting Physician (Gastroenterology) [name], MD as Consulting Physician (Otolaryngology) [name], MD as Consulting Physician (Cardiology) Suzi Roots as Physician Assistant (Dermatology) Malachy Mood, MD as Consulting Physician (Oncology)  Extended Emergency Contact Information Primary Emergency Contact: Gucciardo,[name] Address: **call 2x if no answer 1st time**          [address]          [place], [place] [address] [place] Phone: [phone] Relation: Daughter Secondary Emergency Contact: Rulon Sera, [place] of [place] Phone: [phone] Relation: Son  Code Status:  DNR Goals of care: Advanced Directive information    12/09/2022    9:48 AM  Advanced Directives  Does Patient Have a Medical Advance Directive? Yes  Type of Estate agent of [place];Living will;Out of facility DNR (pink MOST or yellow form)  Does patient want to make changes to medical advance directive? No - Patient declined  Copy of Healthcare Power of Attorney in Chart? Yes - validated most recent copy scanned in chart (See row information)  Pre-existing out of facility DNR order (yellow form or pink MOST form) Yellow form placed in chart (order not valid for inpatient use)     Chief Complaint  Patient presents with   Medical Management of Chronic Issues    HPI:  Pt is a 89 y.o. male seen today for medical management of chronic diseases.    11/06/2022 admitted to SNF at [facility]. PMH: CHF, Afib, AVM, NSTEMI, HTN, cancer of ampulla of vater, Barrett's esophagus, GERD, GI bleed, hypothyroidism, CKD,  anemia, and unstable gait.   Cancer ampulla of vater- followed by Dr. Mosetta Putt, recent PET scan no metastasis, RT finished 03/2022, no plans for chemo due to advanced age AVM- followed by oncology, Hgb 9.6 12/18/2022, given bevacizumab every 4 weeks PAF- followed by cardiology, HR<100 without medication, not on anticoagulation due to previous GI bleeds HTN- BUN/creat 28/1.34 12/18/2022, not on medication Hypothyroidism- TSH 0.05 12/08/2022, 11/12 synthroid decreased to 150 mcg, plan to recheck TSH in 6 weeks CKD- GFR 51 (11/21)> 44 (09/26)> 43 (09/12) OSA- CPAP mask broke, awaiting pulmonary consult> scheduled 12/10 H/o GI bleed- remains on sucralfate and pantoprazole   No recent falls or injuries. Enrolled in PT/OT/ST services.   Recent blood pressures:  11/26- 122/65  11/19- 107/64  11/12- 111/60  Recent weights:  11/01- 169 lbs  10/10- 176.7 lbs   Past Medical History:  Diagnosis Date   Anal fistula    Arthritis    Fingers and hands   Atrial fibrillation (HCC)    AVM (arteriovenous malformation)    Clipped during Colonoscopy 05/2016   Barrett's esophagus    Bilateral cataracts    BPH (benign prostatic hyperplasia)    Cecal angiodysplasia 05/28/2016   ablated at colonoscopy   Deviated nasal septum    Diverticulosis of sigmoid colon    E. coli infection    Fatty liver    GERD (gastroesophageal reflux disease)    History of colon polyps    Hypothyroidism    Iron deficiency anemia    Nodular basal cell  carcinoma (BCC) 11/05/2017   Left Forehead (treatment after biopsy)   OSA on CPAP    Perianal rash    Recurrent epistaxis    Renal cyst 01/04/2013   Small left peripelvic renal cysts , noted on US Renal   SCCA (squamous cell carcinoma) of skin 10/17/2015   Left Sup Bridge of Nose (curet, cautery and 5FU)   Seasonal allergies    Superficial basal cell carcinoma (BCC) 10/17/2015   Left Bulb of Nose (curet, cautery and 5FU)   Tubular adenoma    Past Surgical History:   Procedure Laterality Date   BILIARY STENT PLACEMENT N/A 01/23/2022   Procedure: BILIARY STENT PLACEMENT;  Surgeon: Lemar Lofty., MD;  Location: [facility];  Service: Gastroenterology;  Laterality: N/A;   BILIARY STENT PLACEMENT N/A 08/06/2022   Procedure: BILIARY STENT PLACEMENT;  Surgeon: Lynann Bologna, MD;  Location: [facility];  Service: Gastroenterology;  Laterality: N/A;   BIOPSY  01/23/2022   Procedure: BIOPSY;  Surgeon: Meridee Score Netty Starring., MD;  Location: [facility];  Service: Gastroenterology;;   BIOPSY  07/09/2022   Procedure: BIOPSY;  Surgeon: Shellia Cleverly, DO;  Location: [facility];  Service: Gastroenterology;;   BIOPSY  08/02/2022   Procedure: BIOPSY;  Surgeon: Sherrilyn Rist, MD;  Location: [facility];  Service: Gastroenterology;;   COLONOSCOPY  multiple   CYSTOSCOPY     ENDOSCOPIC RETROGRADE CHOLANGIOPANCREATOGRAPHY (ERCP) WITH PROPOFOL N/A 01/23/2022   Procedure: ENDOSCOPIC RETROGRADE CHOLANGIOPANCREATOGRAPHY (ERCP) WITH PROPOFOL;  Surgeon: Lemar Lofty., MD;  Location: [facility];  Service: Gastroenterology;  Laterality: N/A;   ENTEROSCOPY N/A 08/02/2022   Procedure: ENTEROSCOPY;  Surgeon: Sherrilyn Rist, MD;  Location: [facility];  Service: Gastroenterology;  Laterality: N/A;   ERCP N/A 08/06/2022   Procedure: ENDOSCOPIC RETROGRADE CHOLANGIOPANCREATOGRAPHY (ERCP);  Surgeon: Lynann Bologna, MD;  Location: [facility];  Service: Gastroenterology;  Laterality: N/A;   ESOPHAGOGASTRODUODENOSCOPY  multiple   ESOPHAGOGASTRODUODENOSCOPY N/A 01/23/2022   Procedure: ESOPHAGOGASTRODUODENOSCOPY (EGD);  Surgeon: Lemar Lofty., MD;  Location: [facility];  Service: Gastroenterology;  Laterality: N/A;   ESOPHAGOGASTRODUODENOSCOPY N/A 07/09/2022   Procedure: ESOPHAGOGASTRODUODENOSCOPY (EGD);  Surgeon: Shellia Cleverly, DO;  Location: [facility];  Service: Gastroenterology;  Laterality: N/A;   ESOPHAGOGASTRODUODENOSCOPY N/A  08/21/2022   Procedure: ESOPHAGOGASTRODUODENOSCOPY (EGD);  Surgeon: Jenel Lucks, MD;  Location: [facility] END[facility]COPY;  Service: Gastroenterology;  Laterality: N/A;   EUS N/A 01/23/2022   Procedure: UPPER ENDOSCOPIC ULTRASOUND (EUS) RADIAL;  Surgeon: Lemar Lofty., MD;  Location: [facility];  Service: Gastroenterology;  Laterality: N/A;   EVALUATION UNDER ANESTHESIA WITH FISTULECTOMY N/A 03/02/2018   Procedure: ANORECTAL EXAM UNDER ANESTHESIA WITH REPAIR OF SUPERFICIAL PERIRECTAL FISTULA AND HEMORRHOIDECTOMY;  Surgeon: [name], MD;  Location: [facility];  Service: General;  Laterality: N/A;   EXPLORATORY LAPAROTOMY     HEMOSTASIS CLIP PLACEMENT  07/09/2022   Procedure: HEMOSTASIS CLIP PLACEMENT;  Surgeon: Shellia Cleverly, DO;  Location: [facility];  Service: Gastroenterology;;   HOT HEMOSTASIS N/A 08/02/2022   Procedure: HOT HEMOSTASIS (ARGON PLASMA COAGULATION/BICAP);  Surgeon: Sherrilyn Rist, MD;  Location: [facility];  Service: Gastroenterology;  Laterality: N/A;   HOT HEMOSTASIS N/A 08/21/2022   Procedure: HOT HEMOSTASIS (ARGON PLASMA COAGULATION/BICAP);  Surgeon: Jenel Lucks, MD;  Location: [facility] END[facility];  Service: Gastroenterology;  Laterality: N/A;   IR THORACENTESIS ASP PLEURAL SPACE W/IMG GUIDE  07/10/2022   REMOVAL OF STONES  01/23/2022   Procedure: REMOVAL OF STONES;  Surgeon: Lemar Lofty., MD;  Location:  [facility];  Service: Gastroenterology;;   REMOVAL OF STONES  08/06/2022   Procedure: REMOVAL OF STONES;  Surgeon: Lynann Bologna, MD;  Location: [facility];  Service: Gastroenterology;;   Dennison Mascot  01/23/2022   Procedure: Dennison Mascot;  Surgeon: Lemar Lofty., MD;  Location: [facility];  Service: Gastroenterology;;    Allergies  Allergen Reactions   Nsaids Other (See Comments)    Patient is to not take these because of kidney issues    Outpatient Encounter Medications as of 12/24/2022  Medication Sig   acetaminophen  (TYLENOL) 325 MG tablet Take 650 mg by mouth every 6 (six) hours as needed for fever or moderate pain (pain score 4-6).   bumetanide (BUMEX) 1 MG tablet Take 1 tablet (1 mg total) by mouth daily.   cetirizine (ZYRTEC) 10 MG tablet Take 10 mg by mouth as needed for allergies.   Cholecalciferol (VITAMIN D3) 5000 units CAPS Take 5,000 Units by mouth every morning.   dapagliflozin propanediol (FARXIGA) 5 MG TABS tablet Take 1 tablet (5 mg total) by mouth in the morning.   Glucosamine Sulfate 500 MG TABS Take 500 mg by mouth daily.   levothyroxine (SYNTHROID) 150 MCG tablet Take 1 tablet (150 mcg total) by mouth daily.   Nutritional Supplements (BOOST PLUS PO) Take 1 Can by mouth once. 1 can orally one time a day for weight loss   nystatin cream (MYCOSTATIN) Apply 1 Application topically every 8 (eight) hours as needed (Yeast).   olopatadine (PATANOL) 0.1 % ophthalmic solution Place 1 drop into both eyes daily as needed for allergies.   ondansetron (ZOFRAN) 8 MG tablet Take 1 tablet (8 mg total) by mouth every 8 (eight) hours as needed for nausea or vomiting.   pantoprazole (PROTONIX) 40 MG tablet Take 1 tablet (40 mg total) by mouth 2 (two) times daily.   potassium chloride SA (KLOR-CON M) 20 MEQ tablet Take 1 tablet (20 mEq total) by mouth 2 (two) times daily.   sodium chloride (OCEAN) 0.65 % SOLN nasal spray Place 1 spray into both nostrils as needed for congestion.   sucralfate (CARAFATE) 1 GM/10ML suspension Take 10 mLs (1 g total) by mouth 4 (four) times daily -  with meals and at bedtime.   Zinc Oxide 10 % OINT Apply 1 Application topically as needed.   No facility-administered encounter medications on file as of 12/24/2022.    Review of Systems  Constitutional:  Positive for fatigue.  HENT:  Positive for trouble swallowing. Negative for sore throat.   Eyes:  Negative for visual disturbance.  Respiratory:  Negative for cough, shortness of breath and wheezing.   Cardiovascular:  Negative  for chest pain and leg swelling.  Gastrointestinal:  Negative for abdominal distention, abdominal pain, blood in stool, constipation, diarrhea, nausea and vomiting.  Genitourinary:  Negative for dysuria and hematuria.  Musculoskeletal:  Positive for gait problem.  Skin:  Negative for wound.  Neurological:  Positive for weakness. Negative for dizziness and light-headedness.  Psychiatric/Behavioral:  Negative for confusion, dysphoric mood and sleep disturbance. The patient is not nervous/anxious.     Immunization History  Administered Date(s) Administered   Fluad Quad(high Dose 65+) 11/26/2022   PFIZER Comirnaty(Gray Top)Covid-19 Tri-Sucrose Vaccine 03/05/2019, 03/28/2019   PPD Test 08/30/2022, 09/13/2022   Pfizer Covid-19 Vaccine Bivalent Booster 48yrs & up 01/12/2020   Pertinent  Health Maintenance Due  Topic Date Due   INFLUENZA VACCINE  Completed      08/23/2019    1:59 PM 08/19/2021  4:25 PM 01/23/2022   12:17 PM 11/24/2022   12:05 PM 11/26/2022    1:45 PM  Fall Risk  Falls in the past year? 0   1 1  Was there an injury with Fall?    1 1  Fall Risk Category Calculator    3 2  (RETIRED) Patient Fall Risk Level  Low fall risk Low fall risk    Patient at Risk for Falls Due to    History of fall(s);Impaired balance/gait;Impaired mobility History of fall(s);Impaired balance/gait;Impaired mobility  Fall risk Follow up    Falls evaluation completed;Education provided;Falls prevention discussed Falls evaluation completed;Education provided;Falls prevention discussed   Functional Status Survey:    Vitals:   12/24/22 1015  BP: 122/65  Pulse: 73  Resp: 20  Temp: (!) 97.4 F (36.3 C)  SpO2: 95%  Weight: 171 lb 11.2 oz (77.9 kg)  Height: 5\' 8"  (1.727 m)   Body mass index is 26.11 kg/m. Physical Exam Vitals reviewed.  Constitutional:      General: He is not in acute distress. HENT:     Head: Normocephalic.     Right Ear: There is no impacted cerumen.     Left Ear: There  is no impacted cerumen.     Nose: Nose normal.     Mouth/Throat:     Mouth: Mucous membranes are moist.  Eyes:     General:        Right eye: No discharge.        Left eye: No discharge.  Cardiovascular:     Rate and Rhythm: Normal rate. Rhythm irregular.     Pulses: Normal pulses.     Heart sounds: Normal heart sounds.  Pulmonary:     Effort: Pulmonary effort is normal. No respiratory distress.     Breath sounds: Normal breath sounds. No wheezing.  Abdominal:     General: Bowel sounds are normal. There is no distension.     Palpations: Abdomen is soft.     Tenderness: There is no abdominal tenderness.  Musculoskeletal:     Cervical back: Neck supple.     Right lower leg: Edema present.     Left lower leg: Edema present.     Comments: Non pitting   Skin:    General: Skin is warm.     Capillary Refill: Capillary refill takes less than 2 seconds.  Neurological:     General: No focal deficit present.     Mental Status: He is alert and oriented to person, place, and time.     Motor: Weakness present.     Gait: Gait abnormal.     Comments: Walker minimal assist, wheelchair   Psychiatric:        Mood and Affect: Mood normal.     Labs reviewed: Recent Labs    07/08/22 0237 07/09/22 0126 08/03/22 0252 08/04/22 0301 08/05/22 9147 08/06/22 0605 08/21/22 0247 08/22/22 0116 10/23/22 1122 11/20/22 1329 12/18/22 1222  NA 137   < > 144 142 138   < >  --    < > 139 136 139  K 4.1   < > 3.4* 4.5 4.1   < >  --    < > 4.2 4.2 4.1  CL 104   < > 114* 117* 109   < >  --    < > 106 103 106  CO2 22   < > 19* 20* 21*   < >  --    < > 24 23  26  GLUCOSE 104*   < > 124* 103* 97   < >  --    < > 121* 104* 115*  BUN 29*   < > 32* 31* 33*   < >  --    < > 45* 27* 28*  CREATININE 1.58*   < > 1.58* 1.69* 1.68*   < >  --    < > 1.50* 1.71* 1.34*  CALCIUM 8.6*   < > 8.2* 7.9* 8.2*   < >  --    < > 9.8 9.3 9.5  MG 1.7  --  1.4* 2.1 2.0  --  1.7  --   --   --   --   PHOS 2.1*  --  3.2  --    --   --   --   --   --   --   --    < > = values in this interval not displayed.   Recent Labs    10/23/22 1122 11/20/22 1329 12/18/22 1222  AST 55* 34 51*  ALT 48* 26 53*  ALKPHOS 153* 117 128*  BILITOT 0.5 0.6 0.5  PROT 7.3 7.1 6.5  ALBUMIN 3.4* 3.2* 3.2*   Recent Labs    10/23/22 1122 11/20/22 1329 12/04/22 0000 12/18/22 1222  WBC 3.9* 3.9* 4.2 4.4  NEUTROABS 2.4 2.3 2,701.00 2.9  HGB 12.1* 10.9* 9.3* 9.6*  HCT 36.5* 32.2* 28* 29.3*  MCV 104.9* 106.3*  --  104.6*  PLT 190 177 90* 168   Lab Results  Component Value Date   TSH 0.05 (A) 12/08/2022   Lab Results  Component Value Date   HGBA1C 4.4 (L) 08/02/2022   No results found for: "CHOL", "HDL", "LDLCALC", "LDLDIRECT", "TRIG", "CHOLHDL"  Significant Diagnostic Results in last 30 days:  No results found.  Assessment/Plan 1. Cancer of ampulla of Vater [facility]) - followed by oncology - recent PET with no mets - completed RT 03/2022 - no plans for chemo due to age  4. AVM (arteriovenous malformation) - see above - on bevacizumab every 4 weeks - hgb checked every 2 weeks> scheduled 12/05 and 12/20  3. Permanent atrial fibrillation (HCC) - HR< 100 without medication - no anticoagulation due to h/o GI bleed  4. Primary hypertension - controlled without medication  5. Hypothyroidism, unspecified type - TSH 0.05 12/08/2022 - levothyroxine reduced to 150 mcg 11/12 - recheck TSH in 6 weeks  6. Stage 3b chronic kidney disease (HCC) - encourage hydration with water - avoid NSAIDS  7. OSA (obstructive sleep apnea) - broken CPAP mask - pulmonary referral sent> scheduled 12/10  8. History of GI bleed - see above - cont sucralfate and pantoprazole   9. Weight loss - weight down 5-6 lbs within past month - TSH was 0.05 - cont monthly weights    Family/ staff Communication: plan discussed with patient and nurse  Labs/tests ordered:  none

## 2022-12-29 DIAGNOSIS — M6281 Muscle weakness (generalized): Secondary | ICD-10-CM | POA: Diagnosis not present

## 2022-12-29 DIAGNOSIS — R499 Unspecified voice and resonance disorder: Secondary | ICD-10-CM | POA: Diagnosis not present

## 2022-12-29 DIAGNOSIS — G9341 Metabolic encephalopathy: Secondary | ICD-10-CM | POA: Diagnosis not present

## 2022-12-29 DIAGNOSIS — R41841 Cognitive communication deficit: Secondary | ICD-10-CM | POA: Diagnosis not present

## 2022-12-29 DIAGNOSIS — R2681 Unsteadiness on feet: Secondary | ICD-10-CM | POA: Diagnosis not present

## 2022-12-29 DIAGNOSIS — R1312 Dysphagia, oropharyngeal phase: Secondary | ICD-10-CM | POA: Diagnosis not present

## 2022-12-30 DIAGNOSIS — M6281 Muscle weakness (generalized): Secondary | ICD-10-CM | POA: Diagnosis not present

## 2022-12-30 DIAGNOSIS — R278 Other lack of coordination: Secondary | ICD-10-CM | POA: Diagnosis not present

## 2022-12-30 DIAGNOSIS — G9341 Metabolic encephalopathy: Secondary | ICD-10-CM | POA: Diagnosis not present

## 2022-12-31 ENCOUNTER — Telehealth: Payer: Self-pay

## 2022-12-31 ENCOUNTER — Other Ambulatory Visit: Payer: Self-pay

## 2022-12-31 DIAGNOSIS — G9341 Metabolic encephalopathy: Secondary | ICD-10-CM | POA: Diagnosis not present

## 2022-12-31 DIAGNOSIS — R41841 Cognitive communication deficit: Secondary | ICD-10-CM | POA: Diagnosis not present

## 2022-12-31 DIAGNOSIS — R2681 Unsteadiness on feet: Secondary | ICD-10-CM | POA: Diagnosis not present

## 2022-12-31 DIAGNOSIS — M6281 Muscle weakness (generalized): Secondary | ICD-10-CM | POA: Diagnosis not present

## 2022-12-31 DIAGNOSIS — R1312 Dysphagia, oropharyngeal phase: Secondary | ICD-10-CM | POA: Diagnosis not present

## 2022-12-31 DIAGNOSIS — R278 Other lack of coordination: Secondary | ICD-10-CM | POA: Diagnosis not present

## 2022-12-31 DIAGNOSIS — R499 Unspecified voice and resonance disorder: Secondary | ICD-10-CM | POA: Diagnosis not present

## 2022-12-31 NOTE — Telephone Encounter (Signed)
Faxed a copy of Dr. Georga Bora last office note as an order to draw a CBC between his visits with [place] as per Dr. Mosetta Putt. Received fax confirmation of receipt. Faxed it to attention Nurse Christy.

## 2023-01-01 ENCOUNTER — Ambulatory Visit: Payer: PPO | Admitting: Hematology

## 2023-01-01 ENCOUNTER — Other Ambulatory Visit: Payer: PPO

## 2023-01-01 ENCOUNTER — Ambulatory Visit: Payer: PPO

## 2023-01-01 DIAGNOSIS — M6281 Muscle weakness (generalized): Secondary | ICD-10-CM | POA: Diagnosis not present

## 2023-01-01 DIAGNOSIS — R41841 Cognitive communication deficit: Secondary | ICD-10-CM | POA: Diagnosis not present

## 2023-01-01 DIAGNOSIS — G9341 Metabolic encephalopathy: Secondary | ICD-10-CM | POA: Diagnosis not present

## 2023-01-01 DIAGNOSIS — R2681 Unsteadiness on feet: Secondary | ICD-10-CM | POA: Diagnosis not present

## 2023-01-01 DIAGNOSIS — R499 Unspecified voice and resonance disorder: Secondary | ICD-10-CM | POA: Diagnosis not present

## 2023-01-01 DIAGNOSIS — R1312 Dysphagia, oropharyngeal phase: Secondary | ICD-10-CM | POA: Diagnosis not present

## 2023-01-01 DIAGNOSIS — D649 Anemia, unspecified: Secondary | ICD-10-CM | POA: Diagnosis not present

## 2023-01-01 DIAGNOSIS — R278 Other lack of coordination: Secondary | ICD-10-CM | POA: Diagnosis not present

## 2023-01-02 DIAGNOSIS — R2681 Unsteadiness on feet: Secondary | ICD-10-CM | POA: Diagnosis not present

## 2023-01-02 DIAGNOSIS — R278 Other lack of coordination: Secondary | ICD-10-CM | POA: Diagnosis not present

## 2023-01-02 DIAGNOSIS — G9341 Metabolic encephalopathy: Secondary | ICD-10-CM | POA: Diagnosis not present

## 2023-01-02 DIAGNOSIS — M6281 Muscle weakness (generalized): Secondary | ICD-10-CM | POA: Diagnosis not present

## 2023-01-05 ENCOUNTER — Encounter: Payer: Self-pay | Admitting: Internal Medicine

## 2023-01-05 DIAGNOSIS — R2681 Unsteadiness on feet: Secondary | ICD-10-CM | POA: Diagnosis not present

## 2023-01-05 DIAGNOSIS — M6281 Muscle weakness (generalized): Secondary | ICD-10-CM | POA: Diagnosis not present

## 2023-01-05 DIAGNOSIS — G9341 Metabolic encephalopathy: Secondary | ICD-10-CM | POA: Diagnosis not present

## 2023-01-06 ENCOUNTER — Encounter: Payer: Self-pay | Admitting: Nurse Practitioner

## 2023-01-06 ENCOUNTER — Ambulatory Visit: Payer: PPO

## 2023-01-06 ENCOUNTER — Ambulatory Visit: Payer: PPO | Admitting: Nurse Practitioner

## 2023-01-06 VITALS — BP 121/70 | HR 78 | Ht 67.0 in | Wt 179.8 lb

## 2023-01-06 DIAGNOSIS — R278 Other lack of coordination: Secondary | ICD-10-CM | POA: Diagnosis not present

## 2023-01-06 DIAGNOSIS — R499 Unspecified voice and resonance disorder: Secondary | ICD-10-CM | POA: Diagnosis not present

## 2023-01-06 DIAGNOSIS — M6281 Muscle weakness (generalized): Secondary | ICD-10-CM | POA: Diagnosis not present

## 2023-01-06 DIAGNOSIS — G9341 Metabolic encephalopathy: Secondary | ICD-10-CM | POA: Diagnosis not present

## 2023-01-06 DIAGNOSIS — G4700 Insomnia, unspecified: Secondary | ICD-10-CM | POA: Diagnosis not present

## 2023-01-06 DIAGNOSIS — R41841 Cognitive communication deficit: Secondary | ICD-10-CM | POA: Diagnosis not present

## 2023-01-06 DIAGNOSIS — G4733 Obstructive sleep apnea (adult) (pediatric): Secondary | ICD-10-CM

## 2023-01-06 DIAGNOSIS — R1312 Dysphagia, oropharyngeal phase: Secondary | ICD-10-CM | POA: Diagnosis not present

## 2023-01-06 NOTE — Progress Notes (Unsigned)
@Patient  ID: Carl Woods, male    DOB: 25-Jan-1932, 89 y.o.   MRN: [id number]  Chief Complaint  Patient presents with   Consult    Pt is on cpap, broke about 2 months ago. Pts son was not able to get old sleep study     Referring provider: [name], MD  HPI: 89 year old male, former smoker referred for sleep consult. Past medical history significant for CHF, HTN, hx of MI, OSA on CPAP, Barrett's esophagus, GERD, hypothyroid, CKD, IDA. 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 He completed radiation treatment.  TEST/EVENTS:   01/07/2023: Today - sleep consult Patient presents today with his son to establish care for OSA on CPAP.  He has a history of sleep apnea, diagnosed sometime around 2012.  He has been on CPAP since then.  Unfortunately, his CPAP broke about 2 months ago.  He had had the original machine that he had gotten after his for sleep study.  He has not had much in ways of formal follow-up after this.  Since he has been off CPAP, he is having difficulties with his sleep.  He used to sleep much better and was able to sleep more restfully at night.  He has been told that he snores. His wife told him without the CPAP, he stopped breathing when he slept. This is what initially prompted his workup for sleep apnea.  He does not currently drive.  Denies any morning headaches, sleep parasomnia/paralysis.  Wants to get back on CPAP therapy. Typically with CPAP, he was getting about 6 to 8 hours of sleep at night.  Without the CPAP, he has very fragmented sleep and feels more tired during the day.  He does not take any sleep aids.  His weight is down about 70 pounds since his last sleep study he thinks.  He did not notice any issues with his pressures on his CPAP when he was using it even though he had significant weight loss.  He was set at 10 cm of water and wore a nasal mask.  Does not have his previous sleep study results.  The medical supply company that he used to use has  since gone out of business. He is living at friend's home.  He is currently on the skilled living side but has plans to move to assisted living.  He is a former smoker.  Seldomly drinks alcohol.  No excessive caffeine intake.   Allergies  Allergen Reactions   Nsaids Other (See Comments)    Patient is to not take these because of kidney issues    Immunization History  Administered Date(s) Administered   Fluad Quad(high Dose 65+) 11/26/2022   Moderna Covid-19 Vaccine Bivalent Booster 8yrs & up 12/03/2022   PFIZER Comirnaty(Gray Top)Covid-19 Tri-Sucrose Vaccine 03/05/2019, 03/28/2019   PPD Test 08/30/2022, 09/13/2022   Pfizer Covid-19 Vaccine Bivalent Booster 44yrs & up 01/12/2020    Past Medical History:  Diagnosis Date   Anal fistula    Arthritis    Fingers and hands   Atrial fibrillation (HCC)    AVM (arteriovenous malformation)    Clipped during Colonoscopy 05/2016   Barrett's esophagus    Bilateral cataracts    BPH (benign prostatic hyperplasia)    Cecal angiodysplasia 05/28/2016   ablated at colonoscopy   Deviated nasal septum    Diverticulosis of sigmoid colon    E. coli infection    Fatty liver    GERD (gastroesophageal reflux disease)  History of colon polyps    Hypothyroidism    Iron deficiency anemia    Nodular basal cell carcinoma (BCC) 11/05/2017   Left Forehead (treatment after biopsy)   OSA on CPAP    Perianal rash    Recurrent epistaxis    Renal cyst 01/04/2013   Small left peripelvic renal cysts , noted on US Renal   SCCA (squamous cell carcinoma) of skin 10/17/2015   Left Sup Bridge of Nose (curet, cautery and 5FU)   Seasonal allergies    Superficial basal cell carcinoma (BCC) 10/17/2015   Left Bulb of Nose (curet, cautery and 5FU)   Tubular adenoma     Tobacco History: Social History   Tobacco Use  Smoking Status Former   Current packs/day: 0.00   Average packs/day: 0.5 packs/day for 40.0 years (20.0 ttl pk-yrs)   Types: Cigarettes    Start date: 1966   Quit date: 2006   Years since quitting: 18.9  Smokeless Tobacco Never   Counseling given: Not Answered   Outpatient Medications Prior to Visit  Medication Sig Dispense Refill   acetaminophen (TYLENOL) 325 MG tablet Take 650 mg by mouth every 6 (six) hours as needed for fever or moderate pain (pain score 4-6).     bumetanide (BUMEX) 1 MG tablet Take 1 tablet (1 mg total) by mouth daily.     cetirizine (ZYRTEC) 10 MG tablet Take 10 mg by mouth as needed for allergies.     Cholecalciferol (VITAMIN D3) 5000 units CAPS Take 5,000 Units by mouth every morning.     dapagliflozin propanediol (FARXIGA) 5 MG TABS tablet Take 1 tablet (5 mg total) by mouth in the morning.     Glucosamine Sulfate 500 MG TABS Take 500 mg by mouth daily.     levothyroxine (SYNTHROID) 150 MCG tablet Take 1 tablet (150 mcg total) by mouth daily. 90 tablet 3   Nutritional Supplements (BOOST PLUS PO) Take 1 Can by mouth once. 1 can orally one time a day for weight loss     nystatin cream (MYCOSTATIN) Apply 1 Application topically every 8 (eight) hours as needed (Yeast).     olopatadine (PATANOL) 0.1 % ophthalmic solution Place 1 drop into both eyes daily as needed for allergies.     ondansetron (ZOFRAN) 8 MG tablet Take 1 tablet (8 mg total) by mouth every 8 (eight) hours as needed for nausea or vomiting. 20 tablet 0   pantoprazole (PROTONIX) 40 MG tablet Take 1 tablet (40 mg total) by mouth 2 (two) times daily.     potassium chloride SA (KLOR-CON M) 20 MEQ tablet Take 1 tablet (20 mEq total) by mouth 2 (two) times daily. 60 tablet 0   sodium chloride (OCEAN) 0.65 % SOLN nasal spray Place 1 spray into both nostrils as needed for congestion.     sucralfate (CARAFATE) 1 GM/10ML suspension Take 10 mLs (1 g total) by mouth 4 (four) times daily -  with meals and at bedtime.     Zinc Oxide 10 % OINT Apply 1 Application topically as needed.     No facility-administered medications prior to visit.     Review  of Systems:   Constitutional: No night sweats, fevers, chills, or lassitude. +fatigue, weight loss (stabilized)  HEENT: No headaches, difficulty swallowing, tooth/dental problems, or sore throat. No sneezing, itching, ear ache, nasal congestion, or post nasal drip CV:  No chest pain, orthopnea, PND, swelling in lower extremities, anasarca, dizziness, palpitations, syncope Resp: +snoring. No shortness of breath with  exertion or at rest. No excess mucus or change in color of mucus. No productive or non-productive. No hemoptysis. No wheezing.  No chest wall deformity GI:  No heartburn, indigestion, abdominal pain, nausea, vomiting, diarrhea, change in bowel habits, loss of appetite, bloody stools.  GU: No nocturia  Skin: No rash, lesions, ulcerations MSK:  No joint pain or swelling.   Neuro: No dizziness or lightheadedness.  Psych: No depression or anxiety. Mood stable. +sleep disturbance    Physical Exam:  BP 121/70   Pulse 78   Ht 5\' 7"  (1.702 m)   Wt 179 lb 12.8 oz (81.6 kg)   SpO2 99%   BMI 28.16 kg/m   GEN: Pleasant, interactive, well-kempt; elderly; in no acute distress HEENT:  Normocephalic and atraumatic. PERRLA. Sclera white. Nasal turbinates pink, moist and patent bilaterally. No rhinorrhea present. Oropharynx pink and moist, without exudate or edema. No lesions, ulcerations, or postnasal drip. Mallampati III NECK:  Supple w/ fair ROM. No JVD present. Normal carotid impulses w/o bruits. Thyroid symmetrical with no goiter or nodules palpated. No lymphadenopathy.   CV: RRR, no m/r/g, no peripheral edema. Pulses intact, +2 bilaterally. No cyanosis, pallor or clubbing. PULMONARY:  Unlabored, regular breathing. Clear bilaterally A&P w/o wheezes/rales/rhonchi. No accessory muscle use.  GI: BS present and normoactive. Soft, non-tender to palpation. No organomegaly or masses detected.  MSK: No erythema, warmth or tenderness. Cap refil <2 sec all extrem. No deformities or joint swelling  noted.  Neuro: A/Ox3. No focal deficits noted.   Skin: Warm, no lesions or rashe Psych: Normal affect and behavior. Judgement and thought content appropriate.     Lab Results:  CBC    Component Value Date/Time   WBC 4.4 12/18/2022 1222   RBC 2.80 (L) 12/18/2022 1222   HGB 9.6 (L) 12/18/2022 1222   HGB 10.1 (L) 10/09/2022 0736   HCT 29.3 (L) 12/18/2022 1222   PLT 168 12/18/2022 1222   PLT 154 10/09/2022 0736   MCV 104.6 (H) 12/18/2022 1222   MCH 34.3 (H) 12/18/2022 1222   MCHC 32.8 12/18/2022 1222   RDW 16.9 (H) 12/18/2022 1222   LYMPHSABS 0.8 12/18/2022 1222   MONOABS 0.6 12/18/2022 1222   EOSABS 0.1 12/18/2022 1222   BASOSABS 0.0 12/18/2022 1222    BMET    Component Value Date/Time   NA 139 12/18/2022 1222   K 4.1 12/18/2022 1222   CL 106 12/18/2022 1222   CO2 26 12/18/2022 1222   GLUCOSE 115 (H) 12/18/2022 1222   BUN 28 (H) 12/18/2022 1222   CREATININE 1.34 (H) 12/18/2022 1222   CREATININE 1.71 (H) 11/20/2022 1329   CALCIUM 9.5 12/18/2022 1222   GFRNONAA 51 (L) 12/18/2022 1222   GFRNONAA 38 (L) 11/20/2022 1329   GFRAA 55 (L) 02/23/2018 1345    BNP    Component Value Date/Time   BNP 358.5 (H) 07/10/2022 0139     Imaging:  No results found.  bevacizumab-adcd (VEGZELMA) 400 mg in sodium chloride 0.9 % 100 mL chemo infusion     Date Action Dose Route User   12/18/2022 1422 Infusion Verify (none) Intravenous Rae Roam, RN   12/18/2022 1422 New Bag/Given 400 mg Intravenous Rae Roam, RN      cyanocobalamin (VITAMIN B12) injection 1,000 mcg     Date Action Dose Route User   11/20/2022 1403 Given 1,000 mcg Intramuscular (Left Deltoid) Malissa Hippo, RN      cyanocobalamin (VITAMIN B12) injection 1,000 mcg  Date Action Dose Route User   12/18/2022 1422 Given 1,000 mcg Intramuscular (Left Deltoid) Rae Roam, RN      0.9 %  sodium chloride infusion     Date Action Dose Route User   12/18/2022 1437 Rate/Dose Change  (none) Intravenous Rae Roam, RN   12/18/2022 1434 Rate/Dose Change (none) Intravenous Rae Roam, RN   12/18/2022 1433 Rate/Dose Change (none) Intravenous Rae Roam, RN   12/18/2022 1352 Rate/Dose Change (none) Intravenous Rae Roam, RN   12/18/2022 1352 New Bag/Given (none) Intravenous Rae Roam, RN           No data to display          No results found for: "NITRICOXIDE"      Assessment & Plan:   OSA on CPAP He has a history of sleep apnea, originally diagnosed in 2012.  He has been on CPAP since this time.  Has the same machine that he was originally prescribed.  Recently broke 2 months ago.  Having trouble with restless sleep, daytime fatigue and snoring at night without it.  He has had around a 70 pound weight loss since he was originally diagnosed.  Will plan to repeat home sleep study to reassess severity of sleep apnea.  If he does still have sleep apnea, will send orders for updated CPAP machine once results from sleep study received.  If not, could consider alternative therapies for treatment of insomnia.  Reviewed plan with patient and his son.  All questions addressed.   - discussed how weight can impact sleep and risk for sleep disordered breathing - discussed options to assist with weight loss: combination of diet modification, cardiovascular and strength training exercises   - had an extensive discussion regarding the adverse health consequences related to untreated sleep disordered breathing - specifically discussed the risks for hypertension, coronary artery disease, cardiac dysrhythmias, cerebrovascular disease, and diabetes - lifestyle modification discussed   - discussed how sleep disruption can increase risk of accidents, particularly when driving - safe driving practices were discussed  Patient Instructions  Given your symptoms and history, I am concerned that you still have sleep disordered breathing with sleep  apnea. Since your machine has broken and you have been off therapy for a while, we will need to reassess your sleep apnea with a repeat sleep study. You have been sent home with this today.   If your sleep study reveals sleep apnea, I will send orders for a new CPAP machine for you. It typically takes 2-3 weeks to get results back after you return the home sleep study box to [place]   We discussed how untreated sleep apnea puts an individual at risk for cardiac arrhthymias, pulm HTN, DM, stroke and increases their risk for daytime accidents. We also briefly reviewed treatment options including weight loss, side sleeping position, oral appliance, CPAP therapy or referral to ENT for possible surgical options  Use caution when driving and pull over if you become sleepy.  Follow up in 12 weeks with Katie Adrianne Shackleton,NP, or sooner, if needed     Insomnia Seems to be related to untreated OSA since being off CPAP. See above  Advised if symptoms do not improve or worsen, to please contact office for sooner follow up or seek emergency care.   I spent 35 minutes of dedicated to the care of this patient on the date of this encounter to include pre-visit review of records, face-to-face time with the patient discussing conditions  above, post visit ordering of testing, clinical documentation with the electronic health record, making appropriate referrals as documented, and communicating necessary findings to members of the patients care team.  Noemi Chapel, NP 01/07/2023  Pt aware and understands NP's role.

## 2023-01-06 NOTE — Patient Instructions (Signed)
Given your symptoms and history, I am concerned that you still have sleep disordered breathing with sleep apnea. Since your machine has broken and you have been off therapy for a while, we will need to reassess your sleep apnea with a repeat sleep study. You have been sent home with this today.   If your sleep study reveals sleep apnea, I will send orders for a new CPAP machine for you. It typically takes 2-3 weeks to get results back after you return the home sleep study box to [place]   We discussed how untreated sleep apnea puts an individual at risk for cardiac arrhthymias, pulm HTN, DM, stroke and increases their risk for daytime accidents. We also briefly reviewed treatment options including weight loss, side sleeping position, oral appliance, CPAP therapy or referral to ENT for possible surgical options  Use caution when driving and pull over if you become sleepy.  Follow up in 12 weeks with Carl Rhyanna Sorce,NP, or sooner, if needed

## 2023-01-06 NOTE — Assessment & Plan Note (Signed)
He has a history of sleep apnea, originally diagnosed in 2012.  He has been on CPAP since this time.  Has the same machine that he was originally prescribed.  Recently broke 2 months ago.  Having trouble with restless sleep, daytime fatigue and snoring at night without it.  He has had around a 70 pound weight loss since he was originally diagnosed.  Will plan to repeat home sleep study to reassess severity of sleep apnea.  If he does still have sleep apnea, will send orders for updated CPAP machine once results from sleep study received.  If not, could consider alternative therapies for treatment of insomnia.  Reviewed plan with patient and his son.  All questions addressed.   - discussed how weight can impact sleep and risk for sleep disordered breathing - discussed options to assist with weight loss: combination of diet modification, cardiovascular and strength training exercises   - had an extensive discussion regarding the adverse health consequences related to untreated sleep disordered breathing - specifically discussed the risks for hypertension, coronary artery disease, cardiac dysrhythmias, cerebrovascular disease, and diabetes - lifestyle modification discussed   - discussed how sleep disruption can increase risk of accidents, particularly when driving - safe driving practices were discussed  Patient Instructions  Given your symptoms and history, I am concerned that you still have sleep disordered breathing with sleep apnea. Since your machine has broken and you have been off therapy for a while, we will need to reassess your sleep apnea with a repeat sleep study. You have been sent home with this today.   If your sleep study reveals sleep apnea, I will send orders for a new CPAP machine for you. It typically takes 2-3 weeks to get results back after you return the home sleep study box to [place]   We discussed how untreated sleep apnea puts an individual at risk for cardiac arrhthymias, pulm  HTN, DM, stroke and increases their risk for daytime accidents. We also briefly reviewed treatment options including weight loss, side sleeping position, oral appliance, CPAP therapy or referral to ENT for possible surgical options  Use caution when driving and pull over if you become sleepy.  Follow up in 12 weeks with Katie Trenten Watchman,NP, or sooner, if needed

## 2023-01-07 ENCOUNTER — Encounter: Payer: Self-pay | Admitting: Nurse Practitioner

## 2023-01-07 ENCOUNTER — Other Ambulatory Visit: Payer: Self-pay

## 2023-01-07 DIAGNOSIS — M6281 Muscle weakness (generalized): Secondary | ICD-10-CM | POA: Diagnosis not present

## 2023-01-07 DIAGNOSIS — R41841 Cognitive communication deficit: Secondary | ICD-10-CM | POA: Diagnosis not present

## 2023-01-07 DIAGNOSIS — G9341 Metabolic encephalopathy: Secondary | ICD-10-CM | POA: Diagnosis not present

## 2023-01-07 DIAGNOSIS — R278 Other lack of coordination: Secondary | ICD-10-CM | POA: Diagnosis not present

## 2023-01-07 DIAGNOSIS — G4700 Insomnia, unspecified: Secondary | ICD-10-CM | POA: Insufficient documentation

## 2023-01-07 DIAGNOSIS — R499 Unspecified voice and resonance disorder: Secondary | ICD-10-CM | POA: Diagnosis not present

## 2023-01-07 DIAGNOSIS — R1312 Dysphagia, oropharyngeal phase: Secondary | ICD-10-CM | POA: Diagnosis not present

## 2023-01-07 DIAGNOSIS — R2681 Unsteadiness on feet: Secondary | ICD-10-CM | POA: Diagnosis not present

## 2023-01-07 NOTE — Assessment & Plan Note (Signed)
Seems to be related to untreated OSA since being off CPAP. See above

## 2023-01-08 DIAGNOSIS — R499 Unspecified voice and resonance disorder: Secondary | ICD-10-CM | POA: Diagnosis not present

## 2023-01-08 DIAGNOSIS — R1312 Dysphagia, oropharyngeal phase: Secondary | ICD-10-CM | POA: Diagnosis not present

## 2023-01-08 DIAGNOSIS — R41841 Cognitive communication deficit: Secondary | ICD-10-CM | POA: Diagnosis not present

## 2023-01-08 DIAGNOSIS — G9341 Metabolic encephalopathy: Secondary | ICD-10-CM | POA: Diagnosis not present

## 2023-01-09 DIAGNOSIS — M6281 Muscle weakness (generalized): Secondary | ICD-10-CM | POA: Diagnosis not present

## 2023-01-09 DIAGNOSIS — R2681 Unsteadiness on feet: Secondary | ICD-10-CM | POA: Diagnosis not present

## 2023-01-09 DIAGNOSIS — G9341 Metabolic encephalopathy: Secondary | ICD-10-CM | POA: Diagnosis not present

## 2023-01-09 DIAGNOSIS — R278 Other lack of coordination: Secondary | ICD-10-CM | POA: Diagnosis not present

## 2023-01-12 DIAGNOSIS — R499 Unspecified voice and resonance disorder: Secondary | ICD-10-CM | POA: Diagnosis not present

## 2023-01-12 DIAGNOSIS — G9341 Metabolic encephalopathy: Secondary | ICD-10-CM | POA: Diagnosis not present

## 2023-01-12 DIAGNOSIS — M6281 Muscle weakness (generalized): Secondary | ICD-10-CM | POA: Diagnosis not present

## 2023-01-12 DIAGNOSIS — R41841 Cognitive communication deficit: Secondary | ICD-10-CM | POA: Diagnosis not present

## 2023-01-12 DIAGNOSIS — R2681 Unsteadiness on feet: Secondary | ICD-10-CM | POA: Diagnosis not present

## 2023-01-12 DIAGNOSIS — R1312 Dysphagia, oropharyngeal phase: Secondary | ICD-10-CM | POA: Diagnosis not present

## 2023-01-13 ENCOUNTER — Other Ambulatory Visit: Payer: Self-pay | Admitting: Nurse Practitioner

## 2023-01-13 DIAGNOSIS — R1312 Dysphagia, oropharyngeal phase: Secondary | ICD-10-CM | POA: Diagnosis not present

## 2023-01-13 DIAGNOSIS — G4733 Obstructive sleep apnea (adult) (pediatric): Secondary | ICD-10-CM

## 2023-01-13 DIAGNOSIS — R41841 Cognitive communication deficit: Secondary | ICD-10-CM | POA: Diagnosis not present

## 2023-01-13 DIAGNOSIS — R499 Unspecified voice and resonance disorder: Secondary | ICD-10-CM | POA: Diagnosis not present

## 2023-01-13 DIAGNOSIS — M6281 Muscle weakness (generalized): Secondary | ICD-10-CM | POA: Diagnosis not present

## 2023-01-13 DIAGNOSIS — R278 Other lack of coordination: Secondary | ICD-10-CM | POA: Diagnosis not present

## 2023-01-13 DIAGNOSIS — G9341 Metabolic encephalopathy: Secondary | ICD-10-CM | POA: Diagnosis not present

## 2023-01-14 ENCOUNTER — Encounter: Payer: Self-pay | Admitting: Orthopedic Surgery

## 2023-01-14 ENCOUNTER — Non-Acute Institutional Stay (SKILLED_NURSING_FACILITY): Payer: Self-pay | Admitting: Orthopedic Surgery

## 2023-01-14 DIAGNOSIS — I4821 Permanent atrial fibrillation: Secondary | ICD-10-CM | POA: Diagnosis not present

## 2023-01-14 DIAGNOSIS — R2681 Unsteadiness on feet: Secondary | ICD-10-CM | POA: Diagnosis not present

## 2023-01-14 DIAGNOSIS — Q2730 Arteriovenous malformation, site unspecified: Secondary | ICD-10-CM

## 2023-01-14 DIAGNOSIS — R278 Other lack of coordination: Secondary | ICD-10-CM | POA: Diagnosis not present

## 2023-01-14 DIAGNOSIS — N1832 Chronic kidney disease, stage 3b: Secondary | ICD-10-CM

## 2023-01-14 DIAGNOSIS — E039 Hypothyroidism, unspecified: Secondary | ICD-10-CM

## 2023-01-14 DIAGNOSIS — G9341 Metabolic encephalopathy: Secondary | ICD-10-CM | POA: Diagnosis not present

## 2023-01-14 DIAGNOSIS — M6281 Muscle weakness (generalized): Secondary | ICD-10-CM | POA: Diagnosis not present

## 2023-01-14 DIAGNOSIS — I10 Essential (primary) hypertension: Secondary | ICD-10-CM | POA: Diagnosis not present

## 2023-01-14 DIAGNOSIS — Z8719 Personal history of other diseases of the digestive system: Secondary | ICD-10-CM | POA: Diagnosis not present

## 2023-01-14 DIAGNOSIS — C241 Malignant neoplasm of ampulla of Vater: Secondary | ICD-10-CM

## 2023-01-14 DIAGNOSIS — G4733 Obstructive sleep apnea (adult) (pediatric): Secondary | ICD-10-CM | POA: Diagnosis not present

## 2023-01-14 NOTE — Progress Notes (Signed)
Location:  [facility] Room Number: 9-A Place of Service:  SNF [phone]) Provider:  Inaara Tye Roland Earl, MD  Patient Care Team: [name], MD as PCP - General (Internal Medicine) [name], MD as Consulting Physician (General Surgery) [name], MD as Consulting Physician (Gastroenterology) [name], MD as Consulting Physician (Otolaryngology) [name], MD as Consulting Physician (Cardiology) Suzi Roots as Physician Assistant (Dermatology) Malachy Mood, MD as Consulting Physician (Oncology)  Extended Emergency Contact Information Primary Emergency Contact: Sokoloski,[name] Address: **call 2x if no answer 1st time**          [address]          [place], [place] [address] [place] of [place] Phone: [phone] Relation: Daughter Secondary Emergency Contact: Rulon Sera, [place] Phone: [phone] Relation: Son  Code Status:  DNR Goals of care: Advanced Directive information    01/14/2023    9:48 AM  Advanced Directives  Does Patient Have a Medical Advance Directive? Yes  Type of Estate agent of [place];Living will;Out of facility DNR (pink MOST or yellow form)  Does patient want to make changes to medical advance directive? No - Patient declined  Copy of Healthcare Power of Attorney in Chart? Yes - validated most recent copy scanned in chart (See row information)  Pre-existing out of facility DNR order (yellow form or pink MOST form) Yellow form placed in chart (order not valid for inpatient use)     Chief Complaint  Patient presents with   Routine    HPI:  Pt is a 89 y.o. male seen today for medical management of chronic diseases.    11/06/2022 admitted to SNF at [facility]. PMH: CHF, Afib, AVM, NSTEMI, HTN, cancer of ampulla of vater, Barrett's esophagus, GERD, GI bleed, hypothyroidism, CKD, anemia, and unstable gait.    Passed AL trial. Plans to move to AL later on today.    Cancer ampulla of vater- followed by Dr. Mosetta Putt, recent PET scan no metastasis, RT finished 03/2022, no plans for chemo due to advanced age AVM- followed by oncology, Hgb 9.5 (01/01/2023), given bevacizumab every 4 weeks OSA- evaluated by pulmonary 01/06/2023> new consult since CPAP broke, plan to repeat home sleep study  Atrial fib- followed by cardiology, HR<100 without medication, not on anticoagulation due to previous GI bleeds HTN- BUN/creat 28/1.34 12/18/2022, not on medication Hypothyroidism- TSH 0.05 12/08/2022, 11/12 synthroid decreased to 150 mcg, plan to recheck TSH in 6 weeks CKD- GFR 51 (11/21)> 44 (09/26)> 43 (09/12) H/o GI bleed- remains on sucralfate and pantoprazole  Recent blood pressures:  12/17- 113/65  12/10- 122/68  12/03- 119/70  Recent weights:  12/02- 165.9 lbs  11/06- 169 lbs  10/10- 176.7 lbs  Lost 70 lbs within past year   Past Medical History:  Diagnosis Date   Anal fistula    Arthritis    Fingers and hands   Atrial fibrillation (HCC)    AVM (arteriovenous malformation)    Clipped during Colonoscopy 05/2016   Barrett's esophagus    Bilateral cataracts    BPH (benign prostatic hyperplasia)    Cecal angiodysplasia 05/28/2016   ablated at colonoscopy   Deviated nasal septum    Diverticulosis of sigmoid colon    E. coli infection    Fatty liver    GERD (gastroesophageal reflux disease)    History of colon polyps  Hypothyroidism    Iron deficiency anemia    Nodular basal cell carcinoma (BCC) 11/05/2017   Left Forehead (treatment after biopsy)   OSA on CPAP    Perianal rash    Recurrent epistaxis    Renal cyst 01/04/2013   Small left peripelvic renal cysts , noted on US Renal   SCCA (squamous cell carcinoma) of skin 10/17/2015   Left Sup Bridge of Nose (curet, cautery and 5FU)   Seasonal allergies    Superficial basal cell carcinoma (BCC) 10/17/2015   Left Bulb of Nose (curet,  cautery and 5FU)   Tubular adenoma    Past Surgical History:  Procedure Laterality Date   BILIARY STENT PLACEMENT N/A 01/23/2022   Procedure: BILIARY STENT PLACEMENT;  Surgeon: Lemar Lofty., MD;  Location: [facility];  Service: Gastroenterology;  Laterality: N/A;   BILIARY STENT PLACEMENT N/A 08/06/2022   Procedure: BILIARY STENT PLACEMENT;  Surgeon: Lynann Bologna, MD;  Location: [facility];  Service: Gastroenterology;  Laterality: N/A;   BIOPSY  01/23/2022   Procedure: BIOPSY;  Surgeon: Meridee Score Netty Starring., MD;  Location: [facility];  Service: Gastroenterology;;   BIOPSY  07/09/2022   Procedure: BIOPSY;  Surgeon: Shellia Cleverly, DO;  Location: [facility];  Service: Gastroenterology;;   BIOPSY  08/02/2022   Procedure: BIOPSY;  Surgeon: Sherrilyn Rist, MD;  Location: [facility];  Service: Gastroenterology;;   COLONOSCOPY  multiple   CYSTOSCOPY     ENDOSCOPIC RETROGRADE CHOLANGIOPANCREATOGRAPHY (ERCP) WITH PROPOFOL N/A 01/23/2022   Procedure: ENDOSCOPIC RETROGRADE CHOLANGIOPANCREATOGRAPHY (ERCP) WITH PROPOFOL;  Surgeon: Lemar Lofty., MD;  Location: [facility];  Service: Gastroenterology;  Laterality: N/A;   ENTEROSCOPY N/A 08/02/2022   Procedure: ENTEROSCOPY;  Surgeon: Sherrilyn Rist, MD;  Location: [facility];  Service: Gastroenterology;  Laterality: N/A;   ERCP N/A 08/06/2022   Procedure: ENDOSCOPIC RETROGRADE CHOLANGIOPANCREATOGRAPHY (ERCP);  Surgeon: Lynann Bologna, MD;  Location: [facility];  Service: Gastroenterology;  Laterality: N/A;   ESOPHAGOGASTRODUODENOSCOPY  multiple   ESOPHAGOGASTRODUODENOSCOPY N/A 01/23/2022   Procedure: ESOPHAGOGASTRODUODENOSCOPY (EGD);  Surgeon: Lemar Lofty., MD;  Location: [facility];  Service: Gastroenterology;  Laterality: N/A;   ESOPHAGOGASTRODUODENOSCOPY N/A 07/09/2022   Procedure: ESOPHAGOGASTRODUODENOSCOPY (EGD);  Surgeon: Shellia Cleverly, DO;  Location: [facility]COPY;  Service:  Gastroenterology;  Laterality: N/A;   ESOPHAGOGASTRODUODENOSCOPY N/A 08/21/2022   Procedure: ESOPHAGOGASTRODUODENOSCOPY (EGD);  Surgeon: Jenel Lucks, MD;  Location: [facility] ENDOSCOPY;  Service: Gastroenterology;  Laterality: N/A;   EUS N/A 01/23/2022   Procedure: UPPER ENDOSCOPIC ULTRASOUND (EUS) RADIAL;  Surgeon: Lemar Lofty., MD;  Location: [facility];  Service: Gastroenterology;  Laterality: N/A;   EVALUATION UNDER ANESTHESIA WITH FISTULECTOMY N/A 03/02/2018   Procedure: ANORECTAL EXAM UNDER ANESTHESIA WITH REPAIR OF SUPERFICIAL PERIRECTAL FISTULA AND HEMORRHOIDECTOMY;  Surgeon: [name], MD;  Location: [facility];  Service: General;  Laterality: N/A;   EXPLORATORY LAPAROTOMY     HEMOSTASIS CLIP PLACEMENT  07/09/2022   Procedure: HEMOSTASIS CLIP PLACEMENT;  Surgeon: Shellia Cleverly, DO;  Location: [facility];  Service: Gastroenterology;;   HOT HEMOSTASIS N/A 08/02/2022   Procedure: HOT HEMOSTASIS (ARGON PLASMA COAGULATION/BICAP);  Surgeon: Sherrilyn Rist, MD;  Location: [facility];  Service: Gastroenterology;  Laterality: N/A;   HOT HEMOSTASIS N/A 08/21/2022   Procedure: HOT HEMOSTASIS (ARGON PLASMA COAGULATION/BICAP);  Surgeon: Jenel Lucks, MD;  Location: [facility] ENDOSCOPY;  Service: Gastroenterology;  Laterality: N/A;   IR THORACENTESIS ASP PLEURAL SPACE W/IMG GUIDE  07/10/2022   REMOVAL OF STONES  01/23/2022  Procedure: REMOVAL OF STONES;  Surgeon: Mansouraty, Netty Starring., MD;  Location: [facility];  Service: Gastroenterology;;   REMOVAL OF STONES  08/06/2022   Procedure: REMOVAL OF STONES;  Surgeon: Lynann Bologna, MD;  Location: [facility];  Service: Gastroenterology;;   Dennison Mascot  01/23/2022   Procedure: Dennison Mascot;  Surgeon: Lemar Lofty., MD;  Location: [facility];  Service: Gastroenterology;;    Allergies  Allergen Reactions   Nsaids Other (See Comments)    Patient is to not take these because of kidney issues    Outpatient  Encounter Medications as of 01/14/2023  Medication Sig   acetaminophen (TYLENOL) 325 MG tablet Take 650 mg by mouth every 6 (six) hours as needed for fever or moderate pain (pain score 4-6).   bumetanide (BUMEX) 1 MG tablet Take 1 tablet (1 mg total) by mouth daily.   cetirizine (ZYRTEC) 10 MG tablet Take 10 mg by mouth as needed for allergies.   Cholecalciferol (VITAMIN D3) 5000 units CAPS Take 5,000 Units by mouth every morning.   dapagliflozin propanediol (FARXIGA) 5 MG TABS tablet Take 1 tablet (5 mg total) by mouth in the morning.   Glucosamine Sulfate 500 MG TABS Take 500 mg by mouth daily.   levothyroxine (SYNTHROID) 150 MCG tablet Take 1 tablet (150 mcg total) by mouth daily.   Nutritional Supplements (BOOST PLUS PO) Take 1 Can by mouth once. 1 can orally one time a day for weight loss   nystatin cream (MYCOSTATIN) Apply 1 Application topically every 8 (eight) hours as needed (Yeast).   olopatadine (PATANOL) 0.1 % ophthalmic solution Place 1 drop into both eyes daily as needed for allergies.   ondansetron (ZOFRAN) 8 MG tablet Take 1 tablet (8 mg total) by mouth every 8 (eight) hours as needed for nausea or vomiting.   pantoprazole (PROTONIX) 40 MG tablet Take 1 tablet (40 mg total) by mouth 2 (two) times daily.   potassium chloride SA (KLOR-CON M) 20 MEQ tablet Take 1 tablet (20 mEq total) by mouth 2 (two) times daily.   sodium chloride (OCEAN) 0.65 % SOLN nasal spray Place 1 spray into both nostrils as needed for congestion.   sucralfate (CARAFATE) 1 GM/10ML suspension Take 10 mLs (1 g total) by mouth 4 (four) times daily -  with meals and at bedtime.   Zinc Oxide 10 % OINT Apply 1 Application topically as needed.   No facility-administered encounter medications on file as of 01/14/2023.    Review of Systems  Constitutional:  Positive for fatigue and unexpected weight change. Negative for activity change and appetite change.  HENT:  Negative for hearing loss, sore throat and trouble  swallowing.   Respiratory:  Negative for cough, shortness of breath and wheezing.   Cardiovascular:  Negative for chest pain and leg swelling.  Gastrointestinal:  Negative for abdominal distention, abdominal pain, blood in stool, nausea and vomiting.  Genitourinary:  Negative for hematuria.  Musculoskeletal:  Positive for gait problem.  Skin:  Negative for wound.  Neurological:  Positive for weakness. Negative for dizziness and headaches.  Psychiatric/Behavioral:  Negative for confusion, dysphoric mood and sleep disturbance. The patient is not nervous/anxious.     Immunization History  Administered Date(s) Administered   Fluad Quad(high Dose 65+) 11/26/2022   Moderna Covid-19 Vaccine Bivalent Booster 60yrs & up 12/03/2022   PFIZER Comirnaty(Gray Top)Covid-19 Tri-Sucrose Vaccine 03/05/2019, 03/28/2019   PPD Test 08/30/2022, 09/13/2022   Pfizer Covid-19 Vaccine Bivalent Booster 36yrs & up 01/12/2020   Pertinent  Health Maintenance Due  Topic Date Due   INFLUENZA VACCINE  Completed      08/19/2021    4:25 PM 01/23/2022   12:17 PM 11/24/2022   12:05 PM 11/26/2022    1:45 PM 12/24/2022   12:20 PM  Fall Risk  Falls in the past year?   1 1 1   Was there an injury with Fall?   1 1 1   Fall Risk Category Calculator   3 2 2   (RETIRED) Patient Fall Risk Level Low fall risk Low fall risk     Patient at Risk for Falls Due to   History of fall(s);Impaired balance/gait;Impaired mobility History of fall(s);Impaired balance/gait;Impaired mobility History of fall(s);Impaired balance/gait;Impaired mobility  Fall risk Follow up   Falls evaluation completed;Education provided;Falls prevention discussed Falls evaluation completed;Education provided;Falls prevention discussed Falls evaluation completed;Education provided;Falls prevention discussed   Functional Status Survey:    Vitals:   01/14/23 0942  BP: 113/65  Pulse: 96  Resp: 18  Temp: (!) 97 F (36.1 C)  SpO2: 94%  Weight: 165 lb 14.4 oz  (75.3 kg)  Height: 5\' 7"  (1.702 m)   Body mass index is 25.98 kg/m. Physical Exam Vitals reviewed.  Constitutional:      General: He is not in acute distress. HENT:     Head: Normocephalic.  Eyes:     General:        Right eye: No discharge.        Left eye: No discharge.  Cardiovascular:     Rate and Rhythm: Normal rate. Rhythm irregular.     Pulses: Normal pulses.     Heart sounds: Normal heart sounds.  Pulmonary:     Effort: Pulmonary effort is normal. No respiratory distress.     Breath sounds: No wheezing or rales.  Abdominal:     General: Bowel sounds are normal.     Palpations: Abdomen is soft.  Musculoskeletal:     Cervical back: Neck supple.     Right lower leg: No edema.     Left lower leg: No edema.  Skin:    General: Skin is warm.     Capillary Refill: Capillary refill takes less than 2 seconds.  Neurological:     General: No focal deficit present.     Mental Status: He is alert and oriented to person, place, and time.     Motor: No weakness.     Gait: Gait abnormal.     Comments: Walker/wheelchair  Psychiatric:        Mood and Affect: Mood normal.     Labs reviewed: Recent Labs    07/08/22 0237 07/09/22 0126 08/03/22 0252 08/04/22 0301 08/05/22 6789 08/06/22 0605 08/21/22 0247 08/22/22 0116 10/23/22 1122 11/20/22 1329 12/18/22 1222  NA 137   < > 144 142 138   < >  --    < > 139 136 139  K 4.1   < > 3.4* 4.5 4.1   < >  --    < > 4.2 4.2 4.1  CL 104   < > 114* 117* 109   < >  --    < > 106 103 106  CO2 22   < > 19* 20* 21*   < >  --    < > 24 23 26   GLUCOSE 104*   < > 124* 103* 97   < >  --    < > 121* 104* 115*  BUN 29*   < > 32* 31* 33*   < >  --    < >  45* 27* 28*  CREATININE 1.58*   < > 1.58* 1.69* 1.68*   < >  --    < > 1.50* 1.71* 1.34*  CALCIUM 8.6*   < > 8.2* 7.9* 8.2*   < >  --    < > 9.8 9.3 9.5  MG 1.7  --  1.4* 2.1 2.0  --  1.7  --   --   --   --   PHOS 2.1*  --  3.2  --   --   --   --   --   --   --   --    < > = values in  this interval not displayed.   Recent Labs    10/23/22 1122 11/20/22 1329 12/18/22 1222  AST 55* 34 51*  ALT 48* 26 53*  ALKPHOS 153* 117 128*  BILITOT 0.5 0.6 0.5  PROT 7.3 7.1 6.5  ALBUMIN 3.4* 3.2* 3.2*   Recent Labs    10/23/22 1122 11/20/22 1329 12/04/22 0000 12/18/22 1222  WBC 3.9* 3.9* 4.2 4.4  NEUTROABS 2.4 2.3 2,701.00 2.9  HGB 12.1* 10.9* 9.3* 9.6*  HCT 36.5* 32.2* 28* 29.3*  MCV 104.9* 106.3*  --  104.6*  PLT 190 177 90* 168   Lab Results  Component Value Date   TSH 0.05 (A) 12/08/2022   Lab Results  Component Value Date   HGBA1C 4.4 (L) 08/02/2022   No results found for: "CHOL", "HDL", "LDLCALC", "LDLDIRECT", "TRIG", "CHOLHDL"  Significant Diagnostic Results in last 30 days:  No results found.  Assessment/Plan 1. 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 AVM (arteriovenous malformation) - see above - on bevacizumab every 4 weeks - hgb checked every 2 weeks> scheduled 01/29/2023  3. OSA (obstructive sleep apnea) - CPAP machine broke 2 months ago - 12/10 evaluated by pulmonary - plans to have repeat home sleep study done  4. Permanent atrial fibrillation (HCC) - HR<100 without medication - no DOAC due to h/o GI bleed  5. Essential hypertension - controlled without medication  6. Hypothyroidism, unspecified type - TSH 0.05 12/08/2022 - levothyroxine reduced - plan to recheck TSH 12/23  7. Stage 3b chronic kidney disease (HCC) - encourage hydration with water - avoid NSAIDS  8. History of GI bleed - cont sucralfate and pantoprazole    Family/ staff Communication: plan discussed with patient and nurse  Labs/tests ordered:  TSH 12/23, cbc/diff 01/02

## 2023-01-15 ENCOUNTER — Encounter: Payer: Self-pay | Admitting: Cardiology

## 2023-01-15 ENCOUNTER — Ambulatory Visit: Payer: PPO | Attending: Cardiology | Admitting: Cardiology

## 2023-01-15 VITALS — BP 112/60 | HR 72 | Resp 16 | Ht 67.0 in | Wt 182.6 lb

## 2023-01-15 DIAGNOSIS — I4821 Permanent atrial fibrillation: Secondary | ICD-10-CM | POA: Diagnosis not present

## 2023-01-15 DIAGNOSIS — Z5309 Procedure and treatment not carried out because of other contraindication: Secondary | ICD-10-CM | POA: Diagnosis not present

## 2023-01-15 DIAGNOSIS — Z8719 Personal history of other diseases of the digestive system: Secondary | ICD-10-CM | POA: Diagnosis not present

## 2023-01-15 DIAGNOSIS — I10 Essential (primary) hypertension: Secondary | ICD-10-CM

## 2023-01-15 NOTE — Progress Notes (Signed)
Cardiology Office Note:  .   Date:  01/15/2023  ID:  Carl Woods, DOB 1931-03-27, MRN [id number] PCP: [name], MD  [facility] Providers Cardiologist:  [name], MD   History of Present Illness: .   Carl Woods is a 89 y.o. Caucasian male with permanent atrial fibrillation, obstructive sleep apnea on CPAP and compliant, hypertension, diabetes mellitus with stage IIIa chronic kidney disease, mild centrilobular emphysema, last hospitalization with severe GI bleed and admitted to the hospital on 07/06/2022 requiring 4 units of packed RBC transfusion, hypertension and atrial fibrillation.  He has been closely followed by hematology and oncology as well.   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  He also has peptic ulcer disease as well.   Discussed the use of AI scribe software for clinical note transcription with the patient, who gave verbal consent to proceed.  History of Present Illness   Carl Woods, a patient with a history of atrial fibrillation and duodenal cancer, presents for a follow-up visit after recent hospitalization for COVID-19 and completion of a four-week course of cancer treatment. He reports that he has been doing well overall, with no recurrence of his previous stomach problems since the completion of his cancer treatment. He notes that he was able to walk using a walker prior to his COVID-19 infection, but his mobility was temporarily affected by the illness. He has since recovered and is continuing to improve. He denies any chest pain, shortness of breath, or gastrointestinal bleeding. He has been residing in a friend's home after a stay in a nursing facility and is scheduled for regular check-ups at the cancer center and at his residence. He reports a recent rash in his groin area, which is being treated with a topical medication.      Review of Systems   Cardiovascular:  Negative for chest pain, dyspnea on exertion and leg swelling.    Labs   No results found for: "CHOL", "HDL", "LDLCALC", "LDLDIRECT", "TRIG", "CHOLHDL" Lab Results  Component Value Date   NA 139 12/18/2022   K 4.1 12/18/2022   CO2 26 12/18/2022   GLUCOSE 115 (H) 12/18/2022   BUN 28 (H) 12/18/2022   CREATININE 1.34 (H) 12/18/2022   CALCIUM 9.5 12/18/2022   GFR 33.16 (L) 01/15/2022   GFRNONAA 51 (L) 12/18/2022      Latest Ref Rng & Units 12/18/2022   12:22 PM 11/20/2022    1:29 PM 10/23/2022   11:22 AM  BMP  Glucose 70 - 99 mg/dL 409  811  914   BUN 8 - 23 mg/dL 28  27  45   Creatinine 0.61 - 1.24 mg/dL 7.82  9.56  2.13   Sodium 135 - 145 mmol/L 139  136  139   Potassium 3.5 - 5.1 mmol/L 4.1  4.2  4.2   Chloride 98 - 111 mmol/L 106  103  106   CO2 22 - 32 mmol/L 26  23  24    Calcium 8.9 - 10.3 mg/dL 9.5  9.3  9.8       Latest Ref Rng & Units 12/18/2022   12:22 PM 12/04/2022   12:00 AM 11/20/2022    1:29 PM  CBC  WBC 4.0 - 10.5 K/uL 4.4  4.2     3.9   Hemoglobin 13.0 - 17.0 g/dL 9.6  9.3     08.6   Hematocrit 39.0 -  52.0 % 29.3  28     32.2   Platelets 150 - 400 K/uL 168  90     177      This result is from an external source.    Physical Exam:   VS:  BP 112/60 (BP Location: Left Arm, Patient Position: Sitting, Cuff Size: Normal)   Pulse 72   Resp 16   Ht 5\' 7"  (1.702 m)   Wt 182 lb 9.6 oz (82.8 kg)   SpO2 98%   BMI 28.60 kg/m    Wt Readings from Last 3 Encounters:  01/15/23 182 lb 9.6 oz (82.8 kg)  01/14/23 165 lb 14.4 oz (75.3 kg)  01/06/23 179 lb 12.8 oz (81.6 kg)     Physical Exam Neck:     Vascular: No carotid bruit or JVD.  Cardiovascular:     Rate and Rhythm: Normal rate. Rhythm irregular.     Pulses: Normal pulses and intact distal pulses.     Heart sounds: Normal heart sounds. No murmur heard.    No gallop.  Pulmonary:     Effort: Pulmonary effort is normal.     Breath sounds: Normal breath sounds.  Abdominal:      General: Bowel sounds are normal.     Palpations: Abdomen is soft.  Musculoskeletal:     Right lower leg: No edema.     Left lower leg: No edema.  Skin:    Capillary Refill: Capillary refill takes less than 2 seconds.     Studies Reviewed: .    Echocardiogram 06/18/2021: Normal LV systolic function with visual EF 55-60%. Left ventricle cavity is normal in size. Normal left ventricular wall thickness. Normal global wall motion. Unable to evaluate diastolic function due to atrial fibrillation. Trace aortic regurgitation. Mild tricuspid regurgitation. No evidence of pulmonary hypertension. RVSP measures 33 mmHg. Compared to 02/06/2020 small pericardial effusion has resolved otherwise no significant change.   EKG:    EKG 09/22/2022: Atrial fibrillation with controlled ventricular response at the rate of 64 bpm, diffuse nonspecific ST-T abnormality. Compared to 07/22/2022, no significant change   Medications and allergies    Allergies  Allergen Reactions   Nsaids Other (See Comments)    Patient is to not take these because of kidney issues    Current Outpatient Medications:    acetaminophen (TYLENOL) 325 MG tablet, Take 650 mg by mouth every 6 (six) hours as needed for fever or moderate pain (pain score 4-6)., Disp: , Rfl:    Cholecalciferol (VITAMIN D3) 5000 units CAPS, Take 5,000 Units by mouth every morning., Disp: , Rfl:    dapagliflozin propanediol (FARXIGA) 5 MG TABS tablet, Take 1 tablet (5 mg total) by mouth in the morning., Disp: , Rfl:    Glucosamine Sulfate 500 MG TABS, Take 500 mg by mouth daily., Disp: , Rfl:    glucosamine-chondroitin 500-400 MG tablet, Take 1 tablet by mouth 3 (three) times daily., Disp: , Rfl:    levothyroxine (SYNTHROID) 150 MCG tablet, Take 1 tablet (150 mcg total) by mouth daily., Disp: 90 tablet, Rfl: 3   Nutritional Supplements (BOOST PLUS PO), Take 1 Can by mouth once. 1 can orally one time a day for weight loss, Disp: , Rfl:    nystatin cream  (MYCOSTATIN), Apply 1 Application topically every 8 (eight) hours as needed (Yeast)., Disp: , Rfl:    olopatadine (PATANOL) 0.1 % ophthalmic solution, Place 1 drop into both eyes daily as needed for allergies., Disp: , Rfl:    ondansetron (  ZOFRAN) 8 MG tablet, Take 1 tablet (8 mg total) by mouth every 8 (eight) hours as needed for nausea or vomiting., Disp: 20 tablet, Rfl: 0   pantoprazole (PROTONIX) 40 MG tablet, Take 1 tablet (40 mg total) by mouth 2 (two) times daily., Disp: , Rfl:    potassium chloride SA (KLOR-CON M) 20 MEQ tablet, Take 1 tablet (20 mEq total) by mouth 2 (two) times daily., Disp: 60 tablet, Rfl: 0   sodium chloride (OCEAN) 0.65 % SOLN nasal spray, Place 1 spray into both nostrils as needed for congestion., Disp: , Rfl:    sucralfate (CARAFATE) 1 GM/10ML suspension, Take 10 mLs (1 g total) by mouth 4 (four) times daily -  with meals and at bedtime., Disp: , Rfl:    Zinc Oxide 10 % OINT, Apply 1 Application topically as needed., Disp: , Rfl:    ASSESSMENT AND PLAN: .      ICD-10-CM   1. Permanent atrial fibrillation (HCC)  I48.21     2. Primary hypertension  I10     3. H/O: upper GI bleed  Z87.19     4. 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 No recurrence of GI bleeding. -Continue current cancer therapy. -Continue monitoring with oncology team.  Atrial Fibrillation Stable. Not on anticoagulation due to previous GI bleeding. -No changes to current management. -He is not on any active cardiac medications.  Hypertension Well controlled off medications, likely secondary to cancer therapy. -Continue current management. -Presently blood pressure is low, on Bumex 1 mg daily and patient complains of frequent urination and dry mouth, will discontinue this.  Fluid Overload On Bumetanide and Farxiga. Reports excessive thirst. -Discontinue Bumetanide to reduce thirst.  No clinical  evidence of heart failure.  Continue Farxiga.  Skin Rash Reports rash in the groin area. -Advise nursing home staff to monitor and manage rash.  Follow-up As needed for cardiac concerns.   Signed,  [name], MD, [facility] 01/15/2023, 9:41 PM [facility] [address] [place], [place] [address] Phone: [phone]. Fax:  [phone]

## 2023-01-15 NOTE — Patient Instructions (Signed)
Medication Instructions:  Your physician has recommended you make the following change in your medication: Stop Bumex  *If you need a refill on your cardiac medications before your next appointment, please call your pharmacy*   Lab Work: none If you have labs (blood work) drawn today and your tests are completely normal, you will receive your results only by: MyChart Message (if you have MyChart) OR A paper copy in the mail If you have any lab test that is abnormal or we need to change your treatment, we will call you to review the results.   Testing/Procedures: none   Follow-Up: At [facility], you and your health needs are our priority.  As part of our continuing mission to provide you with exceptional heart care, we have created designated Provider Care Teams.  These Care Teams include your primary Cardiologist (physician) and Advanced Practice Providers (APPs -  Physician Assistants and Nurse Practitioners) who all work together to provide you with the care you need, when you need it.  We recommend signing up for the patient portal called "MyChart".  Sign up information is provided on this After Visit Summary.  MyChart is used to connect with patients for Virtual Visits (Telemedicine).  Patients are able to view lab/test results, encounter notes, upcoming appointments, etc.  Non-urgent messages can be sent to your provider as well.   To learn more about what you can do with MyChart, go to [email or web address].    Your next appointment:   As needed  Provider:   [name], MD     Other Instructions

## 2023-01-16 ENCOUNTER — Inpatient Hospital Stay: Payer: PPO | Attending: Physician Assistant

## 2023-01-16 ENCOUNTER — Inpatient Hospital Stay: Payer: PPO

## 2023-01-16 ENCOUNTER — Inpatient Hospital Stay: Payer: PPO | Admitting: Nurse Practitioner

## 2023-01-16 ENCOUNTER — Encounter: Payer: Self-pay | Admitting: Hematology

## 2023-01-16 VITALS — BP 115/54 | HR 70 | Temp 98.1°F | Resp 16 | Ht 67.0 in | Wt 182.6 lb

## 2023-01-16 VITALS — BP 115/52 | HR 58 | Resp 16

## 2023-01-16 DIAGNOSIS — D519 Vitamin B12 deficiency anemia, unspecified: Secondary | ICD-10-CM

## 2023-01-16 DIAGNOSIS — K5521 Angiodysplasia of colon with hemorrhage: Secondary | ICD-10-CM

## 2023-01-16 DIAGNOSIS — M6281 Muscle weakness (generalized): Secondary | ICD-10-CM | POA: Diagnosis not present

## 2023-01-16 DIAGNOSIS — D649 Anemia, unspecified: Secondary | ICD-10-CM

## 2023-01-16 DIAGNOSIS — C241 Malignant neoplasm of ampulla of Vater: Secondary | ICD-10-CM

## 2023-01-16 DIAGNOSIS — Z5112 Encounter for antineoplastic immunotherapy: Secondary | ICD-10-CM | POA: Diagnosis not present

## 2023-01-16 DIAGNOSIS — R278 Other lack of coordination: Secondary | ICD-10-CM | POA: Diagnosis not present

## 2023-01-16 DIAGNOSIS — G9341 Metabolic encephalopathy: Secondary | ICD-10-CM | POA: Diagnosis not present

## 2023-01-16 LAB — COMPREHENSIVE METABOLIC PANEL
ALT: 31 U/L (ref 0–44)
AST: 35 U/L (ref 15–41)
Albumin: 3.6 g/dL (ref 3.5–5.0)
Alkaline Phosphatase: 105 U/L (ref 38–126)
Anion gap: 8 (ref 5–15)
BUN: 31 mg/dL — ABNORMAL HIGH (ref 8–23)
CO2: 27 mmol/L (ref 22–32)
Calcium: 9.7 mg/dL (ref 8.9–10.3)
Chloride: 106 mmol/L (ref 98–111)
Creatinine, Ser: 1.51 mg/dL — ABNORMAL HIGH (ref 0.61–1.24)
GFR, Estimated: 44 mL/min — ABNORMAL LOW (ref >60)
Glucose, Bld: 84 mg/dL (ref 70–99)
Potassium: 4.1 mmol/L (ref 3.5–5.1)
Sodium: 141 mmol/L (ref 135–145)
Total Bilirubin: 0.5 mg/dL (ref <1.2)
Total Protein: 6.8 g/dL (ref 6.5–8.1)

## 2023-01-16 LAB — CBC WITH DIFFERENTIAL/PLATELET
Abs Immature Granulocytes: 0.01 10*3/uL (ref 0.00–0.07)
Basophils Absolute: 0 10*3/uL (ref 0.0–0.1)
Basophils Relative: 1 %
Eosinophils Absolute: 0.1 10*3/uL (ref 0.0–0.5)
Eosinophils Relative: 3 %
HCT: 30.3 % — ABNORMAL LOW (ref 39.0–52.0)
Hemoglobin: 10.4 g/dL — ABNORMAL LOW (ref 13.0–17.0)
Immature Granulocytes: 0 %
Lymphocytes Relative: 25 %
Lymphs Abs: 1.1 10*3/uL (ref 0.7–4.0)
MCH: 35.1 pg — ABNORMAL HIGH (ref 26.0–34.0)
MCHC: 34.3 g/dL (ref 30.0–36.0)
MCV: 102.4 fL — ABNORMAL HIGH (ref 80.0–100.0)
Monocytes Absolute: 0.7 10*3/uL (ref 0.1–1.0)
Monocytes Relative: 16 %
Neutro Abs: 2.5 10*3/uL (ref 1.7–7.7)
Neutrophils Relative %: 55 %
Platelets: 171 10*3/uL (ref 150–400)
RBC: 2.96 MIL/uL — ABNORMAL LOW (ref 4.22–5.81)
RDW: 17.5 % — ABNORMAL HIGH (ref 11.5–15.5)
WBC: 4.4 10*3/uL (ref 4.0–10.5)
nRBC: 0 % (ref 0.0–0.2)

## 2023-01-16 LAB — VITAMIN B12: Vitamin B-12: 587 pg/mL (ref 180–914)

## 2023-01-16 LAB — SAMPLE TO BLOOD BANK

## 2023-01-16 LAB — FERRITIN: Ferritin: 85 ng/mL (ref 24–336)

## 2023-01-16 MED ORDER — CYANOCOBALAMIN 1000 MCG/ML IJ SOLN
1000.0000 ug | Freq: Once | INTRAMUSCULAR | Status: AC
  Administered 2023-01-16: 1000 ug via INTRAMUSCULAR
  Filled 2023-01-16: qty 1

## 2023-01-16 MED ORDER — SODIUM CHLORIDE 0.9 % IV SOLN
5.0000 mg/kg | Freq: Once | INTRAVENOUS | Status: AC
  Administered 2023-01-16: 400 mg via INTRAVENOUS
  Filled 2023-01-16: qty 16

## 2023-01-16 MED ORDER — SODIUM CHLORIDE 0.9 % IV SOLN: Freq: Once | INTRAVENOUS | Status: AC

## 2023-01-16 NOTE — Assessment & Plan Note (Signed)
-  [id number], diagnosed in 12/2021, MMR normal  -PET scan findings show no evidence of distant metastasis  -Molecular testing, MMR was normal, he is not a candidate for immunotherapy.  Foundation One showed no targetable mutations. -He finished RT on 04/09/2022 -Due to advanced age, chemotherapy not offered. -CT scan in June 2024 showed no evidence of residual disease or new metastasis, although CT is not ideal to evaluate the residual disease in his case.

## 2023-01-16 NOTE — Progress Notes (Signed)
Per  Vincent Gros, NP  - okay to proceed with treatment without urine protein today.

## 2023-01-16 NOTE — Patient Instructions (Signed)
 [facility]  Discharge Instructions: Thank you for choosing [facility] to provide your oncology and hematology care.   If you have a lab appointment with the Cancer Center, please go directly to the Cancer Center and check in at the registration area.   Wear comfortable clothing and clothing appropriate for easy access to any Portacath or PICC line.   We strive to give you quality time with your provider. You may need to reschedule your appointment if you arrive late (15 or more minutes).  Arriving late affects you and other patients whose appointments are after yours.  Also, if you miss three or more appointments without notifying the office, you may be dismissed from the clinic at the provider's discretion.      For prescription refill requests, have your pharmacy contact our office and allow 72 hours for refills to be completed.    Today you received the following chemotherapy and/or immunotherapy agents: Bevacizumab      To help prevent nausea and vomiting after your treatment, we encourage you to take your nausea medication as directed.  BELOW ARE SYMPTOMS THAT SHOULD BE REPORTED IMMEDIATELY: *FEVER GREATER THAN 100.4 F (38 C) OR HIGHER *CHILLS OR SWEATING *NAUSEA AND VOMITING THAT IS NOT CONTROLLED WITH YOUR NAUSEA MEDICATION *UNUSUAL SHORTNESS OF BREATH *UNUSUAL BRUISING OR BLEEDING *URINARY PROBLEMS (pain or burning when urinating, or frequent urination) *BOWEL PROBLEMS (unusual diarrhea, constipation, pain near the anus) TENDERNESS IN MOUTH AND THROAT WITH OR WITHOUT PRESENCE OF ULCERS (sore throat, sores in mouth, or a toothache) UNUSUAL RASH, SWELLING OR PAIN  UNUSUAL VAGINAL DISCHARGE OR ITCHING   Items with * indicate a potential emergency and should be followed up as soon as possible or go to the Emergency Department if any problems should occur.  Please show the CHEMOTHERAPY ALERT CARD or  IMMUNOTHERAPY ALERT CARD at check-in to the Emergency Department and triage nurse.  Should you have questions after your visit or need to cancel or reschedule your appointment, please contact [facility]  Dept: [phone]  and follow the prompts.  Office hours are 8:00 a.m. to 4:30 p.m. Monday - Friday. Please note that voicemails left after 4:00 p.m. may not be returned until the following business day.  We are closed weekends and major holidays. You have access to a nurse at all times for urgent questions. Please call the main number to the clinic Dept: [phone] and follow the prompts.   For any non-urgent questions, you may also contact your provider using MyChart. We now offer e-Visits for anyone 88 and older to request care online for non-urgent symptoms. For details visit [email or web address].   Also download the MyChart app! Go to the app store, search "MyChart", open the app, select [place], and log in with your MyChart username and password.

## 2023-01-16 NOTE — Progress Notes (Signed)
Patient Care Team: [name], MD as PCP - General (Internal Medicine) [name], MD as PCP - Cardiology (Cardiology) [name], MD as Consulting Physician (General Surgery) [name], MD as Consulting Physician (Gastroenterology) [name], MD as Consulting Physician (Otolaryngology) [name], MD as Consulting Physician (Cardiology) Suzi Roots as Physician Assistant (Dermatology) Malachy Mood, MD as Consulting Physician (Oncology)  Clinic Day:  01/25/2023  Referring physician: [name], MD  ASSESSMENT & PLAN:   Assessment & Plan: Plan: Labs reviewed  -CBC showing WBC 4.4; Hgb 10.4; Hct 30.3; MCV 102.4 ; plt 171; Anc 2.5 -CMP - K 4.1; glucose 84; BUN 31; Creatinine 1.51; eGFR 44; Ca 9.7; LFTs normal.   -CA 19-9 28; close vitamin B12 587; Ferritin 85; TSH 0.14. Patient condition is and labs are adequate for treatment today.  Proceed with bevacizumab cycle 6 Day 1. Labs, follow-up, and treatment as scheduled on 02/12/2023.   The patient understands the plans discussed today and is in agreement with them.  He knows to contact our office if he develops concerns prior to his next appointment.  I provided 25 minutes of face-to-face time during this encounter and > 50% was spent counseling as documented under my assessment and plan.    Carlean Jews, NP  [facility] - [facility] [address] [place] [address] Dept: [phone] Dept Fax: [phone]   No orders of the defined types were placed in this encounter.     CHIEF COMPLAINT:  CC: Cancer of ampulla of Vater  Current Treatment: Avastin  INTERVAL HISTORY:  Carl Woods is here today for repeat clinical assessment.  He was last seen by Dr. Mosetta Putt on 12/18/2022.  He does report recurrent dermatitis on his legs.  He does have a cream he can use which does help.  No new concerns or complaints.  He denies chest  pain, chest pressure, or unusual shortness of breath. He denies headaches or visual disturbances. He denies abdominal pain, nausea, vomiting, or changes in bowel or bladder habits.  He denies presence of bblood in his stool. He denies fevers or chills. He denies pain. His appetite is good. His weight has been stable.  I have reviewed the past medical history, past surgical history, social history and family history with the patient and they are unchanged from previous note.  ALLERGIES:  is allergic to nsaids.  MEDICATIONS:  Current Outpatient Medications  Medication Sig Dispense Refill   acetaminophen (TYLENOL) 325 MG tablet Take 650 mg by mouth every 4 (four) hours as needed for fever or moderate pain (pain score 4-6).     Cholecalciferol (VITAMIN D3) 5000 units CAPS Take 5,000 Units by mouth every morning.     dapagliflozin propanediol (FARXIGA) 5 MG TABS tablet Take 1 tablet (5 mg total) by mouth in the morning.     Nutritional Supplements (BOOST PLUS PO) Take 1 Can by mouth once. 1 can orally one time a day for weight loss     nystatin cream (MYCOSTATIN) Apply 1 Application topically every 8 (eight) hours as needed (Yeast).     olopatadine (PATANOL) 0.1 % ophthalmic solution Place 1 drop into both eyes daily as needed for allergies.     pantoprazole (PROTONIX) 40 MG tablet Take 1 tablet (40 mg total) by mouth 2 (two) times daily.     Zinc Oxide 10 % OINT Apply 1 Application topically as  needed.     bumetanide (BUMEX) 1 MG tablet Take 1 mg by mouth in the morning, at noon, in the evening, and at bedtime.     cetirizine (ZYRTEC) 10 MG tablet Take 10 mg by mouth daily as needed for allergies.     Glucosamine 500 MG CAPS Take 1 capsule by mouth every morning.     levothyroxine (SYNTHROID) 137 MCG tablet Take 1 tablet (137 mcg total) by mouth daily before breakfast.     ondansetron (ZOFRAN) 8 MG tablet Take 1 tablet (8 mg total) by mouth every 8 (eight) hours as needed for nausea or vomiting. 20  tablet 0   potassium chloride SA (KLOR-CON M) 20 MEQ tablet Take 20 mEq by mouth 2 (two) times daily.     sucralfate (CARAFATE) 1 g tablet Take 1 g by mouth 4 (four) times daily -  with meals and at bedtime.     No current facility-administered medications for this visit.    HISTORY OF PRESENT ILLNESS:   Oncology History  Cancer of ampulla of Vater (HCC)  12/06/2021 Imaging   CT ABDOMEN PELVIS W CONTRAST   IMPRESSION: 1. There is moderate intrahepatic bile duct dilatation with marked fusiform dilatation of the common bile duct. No CT visible common bile duct stones identified. Differential considerations include a obstructing distal common bile duct stone (not visible by CT), distal CBD stricture, or mass at the level of the ampullary. Consider further evaluation with contrast enhanced MRI/MRCP or ERCP. 2. Contour the liver appears nodular which may reflect underlying cirrhosis. 3. Age-indeterminate compression fracture involving L1 vertebral body with loss of 50% of the vertebral body height. No signs of retropulsion of fracture fragments into the canal. 4. Fat containing umbilical hernia. 5. 5 mm anterolisthesis of L4 on L5. 6.  Aortic Atherosclerosis (ICD10-I70.0).   12/16/2021 Imaging   MR ABDOMEN MRCP W WO CONTAST   IMPRESSION: 1. Moderate intrahepatic and extrahepatic bile duct dilation with smooth tapering to the level of the ampulla without filling defect or focal mass lesion identified. Findings may reflect ampullary stricture or occult ampullary mass. Consider further evaluation with ERCP. 2. Mild dilated main pancreatic duct at the head measuring up to 8 mm. 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 Recommend follow-up pre and post-contrast MRI/MRCP in 2 years. This recommendation follows ACR consensus guidelines: Management of Incidental Pancreatic Cysts: A White Paper  of the ACR Incidental Findings Committee. J Am Coll Radiol 2017;14:911-923. 3.  Aortic Atherosclerosis (ICD10-I70.0).   01/23/2022 Procedure   DG ERCP   IMPRESSION: Dilatation of the extrahepatic bile duct with severe tapering or narrowing in the distal common bile duct. Placement of biliary stent.   These images were submitted for radiologic interpretation only. Please see the procedural report for the amount of contrast.    01/23/2022 Procedure   EUS/ERCP  ENDOSONOGRAPHIC FINDING: There was dilation in the common bile duct (12.3 mm -> 20.4 mm) and in the common hepatic duct (23.0 mm). A small amount of hyperechoic material consistent with sludge was visualized endosonographically in the common bile duct. Moderate hyperechoic material consistent with sludge was visualized endosonographically in the gallbladder with normal gallbladder wall thickness. Pancreatic parenchymal abnormalities were noted in the entire pancreas. These consisted of hyperechoic foci with shadowing and cysts. There was a 9.1 mm by 6.1 mm cyst in the neck of the pancreas. There was a 15.3 mm by 14.1 mm cyst  in the tail of the pancreas. The pancreatic duct had a dilated endosonographic appearance, had a prominently branched endosonographic appearance and had hyperechoic walls in the pancreatic head (PD - 8.2 mm -> 4.3 mm), genu of the pancreas (3.5 mm), body of the pancreas (3.2 mm) and tail of the pancreas (2.8 mm). A hypoechoic irregular lesion was identified endosonographically at the ampulla. The lesion measured 15 mm by 13 mm in maximal cross-sectional diameter. The lesion extended from the mucosa to the submucosa. The outer margins were irregular. This is noted where CBD and PD dilation is noted to occur. Endosonographic imaging in the visualized portion of the liver showed no mass.  No malignant-appearing lymph nodes were visualized in the celiac region (level 20), peripancreatic region and porta  hepatis region. The celiac region was visualized.   01/23/2022 Pathology Results   SURGICAL PATHOLOGY  CASE: [id number]  PATIENT: Carl Woods  Surgical Pathology Report   FINAL MICROSCOPIC DIAGNOSIS:   A. STOMACH, RANDOM, BIOPSY:  - Gastric mucosa, no significant abnormality.  No inflammation,  intestinal metaplasia, dysplasia or malignancy.   B. AMPULLARY LESION, BIOPSY:  - Adenocarcinoma.  See comment.    01/31/2022 Initial Diagnosis   Cancer of ampulla of Vater (HCC)   02/07/2022 Imaging    IMPRESSION: Interval increase in size of lymph node in the anterior cardiophrenic space on the right.   Increase small right pleural effusion compared to the previous MRI.   Subtle nodular tissue along the margin of the right hepatic lobe in the upper abdomen posteriorly. Recommend additional workup when appropriate.   Calcified pleural plaques.   Evidence of chronic liver disease.   Aortic Atherosclerosis (ICD10-I70.0).   02/14/2022 Miscellaneous   Foundation One:  Biomarker Findings Microsatellite status-Cannot be determined Tumor Mutation Burden-Cannot be determined  Genomics Findings EPHB4 amplificatio SF3B1 K666T TP53 R273H        REVIEW OF SYSTEMS:   Constitutional: Denies fevers, chills or abnormal weight loss Eyes: Denies blurriness of vision Ears, nose, mouth, throat, and face: Denies mucositis or sore throat Respiratory: Denies cough, dyspnea or wheezes Cardiovascular: Denies palpitation, chest discomfort or lower extremity swelling Gastrointestinal:  Denies nausea, heartburn or change in bowel habits Skin: Denies abnormal skin rashes Lymphatics: Denies new lymphadenopathy or easy bruising Neurological:Denies numbness, tingling or new weaknesses Behavioral/Psych: Mood is stable, no new changes  All other systems were reviewed with the patient and are negative.   VITALS:   Today's Vitals   01/16/23 1315 01/16/23 1325  BP: (!) 115/54   Pulse: 70    Resp: 16   Temp: 98.1 F (36.7 C)   SpO2: 100%   Weight: 182 lb 9.6 oz (82.8 kg)   Height: 5\' 7"  (1.702 m)   PainSc:  0-No pain   Body mass index is 28.6 kg/m.   Wt Readings from Last 3 Encounters:  01/23/23 165 lb 14.4 oz (75.3 kg)  01/16/23 182 lb 9.6 oz (82.8 kg)  01/15/23 182 lb 9.6 oz (82.8 kg)    Body mass index is 28.6 kg/m.  Performance status (ECOG): 1 - Symptomatic but completely ambulatory  PHYSICAL EXAM:   GENERAL:alert, no distress and comfortable SKIN: skin color, texture, turgor are normal, no rashes or significant lesions EYES: normal, Conjunctiva are pink and non-injected, sclera clear OROPHARYNX:no exudate, no erythema and lips, buccal mucosa, and tongue normal  NECK: supple, thyroid normal size, non-tender, without nodularity LYMPH:  no palpable lymphadenopathy in the cervical, axillary or inguinal LUNGS: clear to auscultation and percussion  with normal breathing effort HEART: regular rate & rhythm and no murmurs and no lower extremity edema ABDOMEN:abdomen soft, non-tender and normal bowel sounds Musculoskeletal:no cyanosis of digits and no clubbing  NEURO: alert & oriented x 3 with fluent speech, no focal motor/sensory deficits  LABORATORY DATA:  I have reviewed the data as listed    Component Value Date/Time   NA 141 01/16/2023 1240   K 4.1 01/16/2023 1240   CL 106 01/16/2023 1240   CO2 27 01/16/2023 1240   GLUCOSE 84 01/16/2023 1240   BUN 31 (H) 01/16/2023 1240   CREATININE 1.51 (H) 01/16/2023 1240   CREATININE 1.71 (H) 11/20/2022 1329   CALCIUM 9.7 01/16/2023 1240   PROT 6.8 01/16/2023 1240   ALBUMIN 3.6 01/16/2023 1240   AST 35 01/16/2023 1240   AST 34 11/20/2022 1329   ALT 31 01/16/2023 1240   ALT 26 11/20/2022 1329   ALKPHOS 105 01/16/2023 1240   BILITOT 0.5 01/16/2023 1240   BILITOT 0.6 11/20/2022 1329   GFRNONAA 44 (L) 01/16/2023 1240   GFRNONAA 38 (L) 11/20/2022 1329   GFRAA 55 (L) 02/23/2018 1345    Lab Results  Component  Value Date   WBC 4.4 01/16/2023   NEUTROABS 2.5 01/16/2023   HGB 10.4 (L) 01/16/2023   HCT 30.3 (L) 01/16/2023   MCV 102.4 (H) 01/16/2023   PLT 171 01/16/2023

## 2023-01-17 LAB — CANCER ANTIGEN 19-9: CA 19-9: 28 U/mL (ref 0–35)

## 2023-01-19 DIAGNOSIS — G9341 Metabolic encephalopathy: Secondary | ICD-10-CM | POA: Diagnosis not present

## 2023-01-19 DIAGNOSIS — R2681 Unsteadiness on feet: Secondary | ICD-10-CM | POA: Diagnosis not present

## 2023-01-19 DIAGNOSIS — M6281 Muscle weakness (generalized): Secondary | ICD-10-CM | POA: Diagnosis not present

## 2023-01-19 DIAGNOSIS — R278 Other lack of coordination: Secondary | ICD-10-CM | POA: Diagnosis not present

## 2023-01-19 DIAGNOSIS — E039 Hypothyroidism, unspecified: Secondary | ICD-10-CM | POA: Diagnosis not present

## 2023-01-19 LAB — TSH: TSH: 0.14 — AB (ref 0.41–5.90)

## 2023-01-20 DIAGNOSIS — M6281 Muscle weakness (generalized): Secondary | ICD-10-CM | POA: Diagnosis not present

## 2023-01-20 DIAGNOSIS — R2681 Unsteadiness on feet: Secondary | ICD-10-CM | POA: Diagnosis not present

## 2023-01-20 DIAGNOSIS — G9341 Metabolic encephalopathy: Secondary | ICD-10-CM | POA: Diagnosis not present

## 2023-01-22 DIAGNOSIS — R278 Other lack of coordination: Secondary | ICD-10-CM | POA: Diagnosis not present

## 2023-01-22 DIAGNOSIS — G9341 Metabolic encephalopathy: Secondary | ICD-10-CM | POA: Diagnosis not present

## 2023-01-22 DIAGNOSIS — M6281 Muscle weakness (generalized): Secondary | ICD-10-CM | POA: Diagnosis not present

## 2023-01-22 DIAGNOSIS — R2681 Unsteadiness on feet: Secondary | ICD-10-CM | POA: Diagnosis not present

## 2023-01-23 ENCOUNTER — Non-Acute Institutional Stay: Payer: Self-pay | Admitting: Orthopedic Surgery

## 2023-01-23 ENCOUNTER — Encounter: Payer: Self-pay | Admitting: Orthopedic Surgery

## 2023-01-23 DIAGNOSIS — E039 Hypothyroidism, unspecified: Secondary | ICD-10-CM

## 2023-01-23 DIAGNOSIS — R278 Other lack of coordination: Secondary | ICD-10-CM | POA: Diagnosis not present

## 2023-01-23 DIAGNOSIS — R2681 Unsteadiness on feet: Secondary | ICD-10-CM | POA: Diagnosis not present

## 2023-01-23 DIAGNOSIS — G9341 Metabolic encephalopathy: Secondary | ICD-10-CM | POA: Diagnosis not present

## 2023-01-23 DIAGNOSIS — M6281 Muscle weakness (generalized): Secondary | ICD-10-CM | POA: Diagnosis not present

## 2023-01-23 MED ORDER — LEVOTHYROXINE SODIUM 137 MCG PO TABS
137.0000 ug | ORAL_TABLET | Freq: Every day | ORAL | Status: AC
Start: 2023-01-23 — End: ?

## 2023-01-23 NOTE — Progress Notes (Signed)
Location:   [facility]  Nursing Home Room Number: 31-A Place of Service:  ALF [phone]) Provider:  Hazle Nordmann, NP  PCP: [name], MD  Patient Care Team: [name], MD as PCP - General (Internal Medicine) [name], MD as PCP - Cardiology (Cardiology) [name], MD as Consulting Physician (General Surgery) [name], MD as Consulting Physician (Gastroenterology) [name], MD as Consulting Physician (Otolaryngology) [name], MD as Consulting Physician (Cardiology) Suzi Roots as Physician Assistant (Dermatology) Malachy Mood, MD as Consulting Physician (Oncology)  Extended Emergency Contact Information Primary Emergency Contact: Balingit,[name] Address: **call 2x if no answer 1st time**          [address]          [place], [place] [address] D[place] of [place] Phone: [phone] Relation: Daughter Secondary Emergency Contact: Hegarty(POA),[name], [place] [address] D[place] of [place] Home Phone: [phone] Mobile Phone: [phone] Relation: Son  Code Status:  DNR Goals of care: Advanced Directive information    01/23/2023    9:46 AM  Advanced Directives  Does Patient Have a Medical Advance Directive? Yes  Type of Estate agent of [place];Living will;Out of facility DNR (pink MOST or yellow form)  Does patient want to make changes to medical advance directive? No - Patient declined  Copy of Healthcare Power of Attorney in Chart? Yes - validated most recent copy scanned in chart (See row information)     Chief Complaint  Patient presents with   Acute Visit    Abnormal TSH.    HPI:  Pt is a 89 y.o. male seen today for an acute visit due to abnormal thyroid level.   12/18 moved to assisted living at [facility] PMH: CHF, Afib, AVM, NSTEMI, HTN, cancer of ampulla of vater, Barrett's esophagus, GERD, GI bleed, hypothyroidism, CKD, anemia, and unstable gait.   11/11 TSH  0.05, levothyroxine was reduced to 150 mcg daily. 12/23 repeat TSH 0.14. He denies fatigue, insomnia, weight loss, diarrhea or increased anxiety. Plan to reduce levothyroxine to 137 mcg and recheck TSH in 6 weeks. He agrees to plan of care.     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   Left Forehead (treatment after biopsy)   OSA on CPAP    Perianal rash    Recurrent epistaxis    Renal cyst 01/04/2013   Small left peripelvic renal cysts , noted on US Renal   SCCA (squamous cell carcinoma) of skin 10/17/2015   Left Sup Bridge of Nose (curet, cautery and 5FU)   Seasonal allergies    Superficial basal cell carcinoma (BCC) 10/17/2015   Left Bulb of Nose (curet, cautery and 5FU)   Tubular adenoma    Past Surgical History:  Procedure Laterality Date   BILIARY STENT PLACEMENT N/A 01/23/2022   Procedure: BILIARY STENT PLACEMENT;  Surgeon: Meridee Score Netty Starring., MD;  Location: [facility] ENDOSCOPY;  Service: Gastroenterology;  Laterality: N/A;   BILIARY STENT PLACEMENT N/A 08/06/2022   Procedure: BILIARY STENT PLACEMENT;  Surgeon: Lynann Bologna, MD;  Location: [facility];  Service: Gastroenterology;  Laterality: N/A;   BIOPSY  01/23/2022   Procedure: BIOPSY;  Surgeon: Meridee Score Netty Starring., MD;  Location: [facility];  Service: Gastroenterology;;   BIOPSY  07/09/2022   Procedure: BIOPSY;  Surgeon: Shellia Cleverly, DO;  Location: [facility]  ENDOSCOPY;  Service: Gastroenterology;;   BIOPSY  08/02/2022   Procedure: BIOPSY;  Surgeon: Sherrilyn Rist, MD;  Location: [facility];  Service: Gastroenterology;;   COLONOSCOPY  multiple   CYSTOSCOPY     ENDOSCOPIC RETROGRADE CHOLANGIOPANCREATOGRAPHY (ERCP) WITH PROPOFOL N/A 01/23/2022   Procedure: ENDOSCOPIC RETROGRADE CHOLANGIOPANCREATOGRAPHY (ERCP) WITH PROPOFOL;  Surgeon: Lemar Lofty., MD;  Location: [facility];  Service: Gastroenterology;  Laterality: N/A;   ENTEROSCOPY N/A 08/02/2022   Procedure: ENTEROSCOPY;  Surgeon: Sherrilyn Rist, MD;  Location: [facility];  Service: Gastroenterology;  Laterality: N/A;   ERCP N/A 08/06/2022   Procedure: ENDOSCOPIC RETROGRADE CHOLANGIOPANCREATOGRAPHY (ERCP);  Surgeon: Lynann Bologna, MD;  Location: [facility];  Service: Gastroenterology;  Laterality: N/A;   ESOPHAGOGASTRODUODENOSCOPY  multiple   ESOPHAGOGASTRODUODENOSCOPY N/A 01/23/2022   Procedure: ESOPHAGOGASTRODUODENOSCOPY (EGD);  Surgeon: Lemar Lofty., MD;  Location: [facility];  Service: Gastroenterology;  Laterality: N/A;   ESOPHAGOGASTRODUODENOSCOPY N/A 07/09/2022   Procedure: ESOPHAGOGASTRODUODENOSCOPY (EGD);  Surgeon: Shellia Cleverly, DO;  Location: [facility] ENDOSCOPY;  Service: Gastroenterology;  Laterality: N/A;   ESOPHAGOGASTRODUODENOSCOPY N/A 08/21/2022   Procedure: ESOPHAGOGASTRODUODENOSCOPY (EGD);  Surgeon: Jenel Lucks, MD;  Location: [facility] ENDOSCOPY;  Service: Gastroenterology;  Laterality: N/A;   EUS N/A 01/23/2022   Procedure: UPPER ENDOSCOPIC ULTRASOUND (EUS) RADIAL;  Surgeon: Lemar Lofty., MD;  Location: [facility];  Service: Gastroenterology;  Laterality: N/A;   EVALUATION UNDER ANESTHESIA WITH FISTULECTOMY N/A 03/02/2018   Procedure: ANORECTAL EXAM UNDER ANESTHESIA WITH REPAIR OF SUPERFICIAL PERIRECTAL FISTULA AND HEMORRHOIDECTOMY;  Surgeon: [name], MD;  Location: [facility];  Service: General;  Laterality: N/A;   EXPLORATORY  LAPAROTOMY     HEMOSTASIS CLIP PLACEMENT  07/09/2022   Procedure: HEMOSTASIS CLIP PLACEMENT;  Surgeon: Shellia Cleverly, DO;  Location: [facility] ENDOS[facility];  Service: Gastroenterology;;   HOT HEMOSTASIS N/A 08/02/2022   Procedure: HOT HEMOSTASIS (ARGON PLASMA COAGULATION/BICAP);  Surgeon: Sherrilyn Rist, MD;  Location: [facility];  Service: Gastroenterology;  Laterality: N/A;   HOT HEMOSTASIS N/A 08/21/2022   Procedure: HOT HEMOSTASIS (ARGON PLASMA COAGULATION/BICAP);  Surgeon: Jenel Lucks, MD;  Location: [facility] ENDOSCOPY;  Service: Gastroenterology;  Laterality: N/A;   IR THORACENTESIS ASP PLEURAL SPACE W/IMG GUIDE  07/10/2022   REMOVAL OF STONES  01/23/2022   Procedure: REMOVAL OF STONES;  Surgeon: Meridee Score Netty Starring., MD;  Location: [facility];  Service: Gastroenterology;;   REMOVAL OF STONES  08/06/2022   Procedure: REMOVAL OF STONES;  Surgeon: Lynann Bologna, MD;  Location: [facility];  Service: Gastroenterology;;   Dennison Mascot  01/23/2022   Procedure: Dennison Mascot;  Surgeon: Mansouraty, Netty Starring., MD;  Location: [facility] END[facility];  Service: Gastroenterology;;    Allergies  Allergen Reactions   Nsaids Other (See Comments)    Patient is to not take these because of kidney issues    Allergies as of 01/23/2023       Reactions   Nsaids Other (See  Comments)   Patient is to not take these because of kidney issues        Medication List        Accurate as of January 23, 2023  9:46 AM. If you have any questions, ask your nurse or doctor.          STOP taking these medications    glucosamine-chondroitin 500-400 MG tablet Stopped by: Giancarlo Askren E Adekunle Rohrbach   sodium chloride 0.65 % Soln nasal spray Commonly known as: OCEAN Stopped by: Octavia Heir       TAKE these medications    acetaminophen 325 MG tablet Commonly known as: TYLENOL Take 650 mg by mouth every 4 (four) hours as needed for fever or moderate pain (pain score 4-6).   BOOST PLUS PO Take 1 Can by mouth  once. 1 can orally one time a day for weight loss   bumetanide 1 MG tablet Commonly known as: BUMEX Take 1 mg by mouth in the morning, at noon, in the evening, and at bedtime.   cetirizine 10 MG tablet Commonly known as: ZYRTEC Take 10 mg by mouth daily as needed for allergies.   dapagliflozin propanediol 5 MG Tabs tablet Commonly known as: FARXIGA Take 1 tablet (5 mg total) by mouth in the morning.   Glucosamine 500 MG Caps Take 1 capsule by mouth every morning. What changed: Another medication with the same name was removed. Continue taking this medication, and follow the directions you see here. Changed by: Octavia Heir   levothyroxine 150 MCG tablet Commonly known as: SYNTHROID Take 1 tablet (150 mcg total) by mouth daily.   nystatin cream Commonly known as: MYCOSTATIN Apply 1 Application topically every 8 (eight) hours as needed (Yeast).   olopatadine 0.1 % ophthalmic solution Commonly known as: PATANOL Place 1 drop into both eyes daily as needed for allergies.   ondansetron 8 MG tablet Commonly known as: ZOFRAN Take 1 tablet (8 mg total) by mouth every 8 (eight) hours as needed for nausea or vomiting.   pantoprazole 40 MG tablet Commonly known as: PROTONIX Take 1 tablet (40 mg total) by mouth 2 (two) times daily.   potassium chloride SA 20 MEQ tablet Commonly known as: KLOR-CON M Take 20 mEq by mouth 2 (two) times daily. What changed: Another medication with the same name was removed. Continue taking this medication, and follow the directions you see here. Changed by: Octavia Heir   sucralfate 1 g tablet Commonly known as: CARAFATE Take 1 g by mouth 4 (four) times daily -  with meals and at bedtime. What changed: Another medication with the same name was removed. Continue taking this medication, and follow the directions you see here. Changed by: Octavia Heir   Vitamin D3 125 MCG (5000 UT) Caps Take 5,000 Units by mouth every morning.   Zinc Oxide 10 %  Oint Apply 1 Application topically as needed.        Review of Systems  Constitutional:  Negative for activity change, appetite change and fatigue.  Cardiovascular:  Negative for chest pain and palpitations.  Gastrointestinal:  Negative for diarrhea.  Psychiatric/Behavioral:  Negative for dysphoric mood and sleep disturbance. The patient is not nervous/anxious.     Immunization History  Administered Date(s) Administered   Fluad Quad(high Dose 65+) 11/26/2022   Influenza, Quadrivalent, Recombinant, Inj, Pf 10/04/2020   Moderna Covid-19 Vaccine Bivalent Booster 83yrs & up 12/03/2022   PFIZER Comirnaty(Gray Top)Covid-19 Tri-Sucrose Vaccine 03/05/2019, 03/28/2019   PPD Test  08/30/2022, 09/13/2022   Pfizer Covid-19 Vaccine Bivalent Booster 63yrs & up 01/12/2020   Zoster Recombinant(Shingrix) 04/18/2021, 08/20/2021   Pertinent  Health Maintenance Due  Topic Date Due   INFLUENZA VACCINE  Completed      01/23/2022   12:17 PM 11/24/2022   12:05 PM 11/26/2022    1:45 PM 12/24/2022   12:20 PM 01/14/2023    3:16 PM  Fall Risk  Falls in the past year?  1 1 1 1   Was there an injury with Fall?  1 1 1 1   Fall Risk Category Calculator  3 2 2 2   (RETIRED) Patient Fall Risk Level Low fall risk      Patient at Risk for Falls Due to  History of fall(s);Impaired balance/gait;Impaired mobility History of fall(s);Impaired balance/gait;Impaired mobility History of fall(s);Impaired balance/gait;Impaired mobility History of fall(s);Impaired balance/gait;Impaired mobility  Fall risk Follow up  Falls evaluation completed;Education provided;Falls prevention discussed Falls evaluation completed;Education provided;Falls prevention discussed Falls evaluation completed;Education provided;Falls prevention discussed Falls evaluation completed;Education provided;Falls prevention discussed   Functional Status Survey:    Vitals:   01/23/23 0930  BP: (!) 144/66  Pulse: 64  Resp: 18  Temp: (!) 97.3 F  (36.3 C)  SpO2: 98%  Weight: 165 lb 14.4 oz (75.3 kg)  Height: 5\' 7"  (1.702 m)   Body mass index is 25.98 kg/m. Physical Exam Vitals reviewed.  Constitutional:      General: He is not in acute distress. HENT:     Head: Normocephalic.  Eyes:     General:        Right eye: No discharge.        Left eye: No discharge.  Cardiovascular:     Rate and Rhythm: Normal rate. Rhythm irregular.     Pulses: Normal pulses.     Heart sounds: Normal heart sounds.  Pulmonary:     Effort: Pulmonary effort is normal.     Breath sounds: Normal breath sounds.  Abdominal:     Palpations: Abdomen is soft.  Musculoskeletal:     Right lower leg: No edema.     Left lower leg: No edema.  Skin:    General: Skin is warm.     Capillary Refill: Capillary refill takes less than 2 seconds.  Neurological:     General: No focal deficit present.     Mental Status: He is alert and oriented to person, place, and time.     Gait: Gait abnormal.  Psychiatric:        Mood and Affect: Mood normal.     Labs reviewed: Recent Labs    07/08/22 0237 07/09/22 0126 08/03/22 0252 08/04/22 0301 08/05/22 1610 08/06/22 0605 08/21/22 0247 08/22/22 0116 11/20/22 1329 12/18/22 1222 01/16/23 1240  NA 137   < > 144 142 138   < >  --    < > 136 139 141  K 4.1   < > 3.4* 4.5 4.1   < >  --    < > 4.2 4.1 4.1  CL 104   < > 114* 117* 109   < >  --    < > 103 106 106  CO2 22   < > 19* 20* 21*   < >  --    < > 23 26 27   GLUCOSE 104*   < > 124* 103* 97   < >  --    < > 104* 115* 84  BUN 29*   < > 32* 31* 33*   < >  --    < >  27* 28* 31*  CREATININE 1.58*   < > 1.58* 1.69* 1.68*   < >  --    < > 1.71* 1.34* 1.51*  CALCIUM 8.6*   < > 8.2* 7.9* 8.2*   < >  --    < > 9.3 9.5 9.7  MG 1.7  --  1.4* 2.1 2.0  --  1.7  --   --   --   --   PHOS 2.1*  --  3.2  --   --   --   --   --   --   --   --    < > = values in this interval not displayed.   Recent Labs    11/20/22 1329 12/18/22 1222 01/16/23 1240  AST 34 51* 35   ALT 26 53* 31  ALKPHOS 117 128* 105  BILITOT 0.6 0.5 0.5  PROT 7.1 6.5 6.8  ALBUMIN 3.2* 3.2* 3.6   Recent Labs    11/20/22 1329 12/04/22 0000 12/18/22 1222 01/16/23 1240  WBC 3.9* 4.2 4.4 4.4  NEUTROABS 2.3 2,701.00 2.9 2.5  HGB 10.9* 9.3* 9.6* 10.4*  HCT 32.2* 28* 29.3* 30.3*  MCV 106.3*  --  104.6* 102.4*  PLT 177 90* 168 171   Lab Results  Component Value Date   TSH 0.14 (A) 01/19/2023   Lab Results  Component Value Date   HGBA1C 4.4 (L) 08/02/2022   No results found for: "CHOL", "HDL", "LDLCALC", "LDLDIRECT", "TRIG", "CHOLHDL"  Significant Diagnostic Results in last 30 days:  No results found.  Assessment/Plan 1. Hypothyroidism, unspecified type (Primary) - 11/11 TSH 0.05, levothyroxine was reduced to 150 mcg daily - 12/23 repeat TSH 0.14 - asymptomatic - will reduce levothyroxine to 137 mcg  - repeat TSH in 6 weeks   Family/ staff Communication: plan discussed with patient and nurse  Labs/tests ordered:  none

## 2023-01-25 ENCOUNTER — Encounter: Payer: Self-pay | Admitting: Hematology

## 2023-01-25 ENCOUNTER — Encounter: Payer: Self-pay | Admitting: Nurse Practitioner

## 2023-01-26 DIAGNOSIS — G9341 Metabolic encephalopathy: Secondary | ICD-10-CM | POA: Diagnosis not present

## 2023-01-26 DIAGNOSIS — M6281 Muscle weakness (generalized): Secondary | ICD-10-CM | POA: Diagnosis not present

## 2023-01-26 DIAGNOSIS — R2681 Unsteadiness on feet: Secondary | ICD-10-CM | POA: Diagnosis not present

## 2023-01-27 DIAGNOSIS — M6281 Muscle weakness (generalized): Secondary | ICD-10-CM | POA: Diagnosis not present

## 2023-01-27 DIAGNOSIS — R278 Other lack of coordination: Secondary | ICD-10-CM | POA: Diagnosis not present

## 2023-01-27 DIAGNOSIS — G9341 Metabolic encephalopathy: Secondary | ICD-10-CM | POA: Diagnosis not present

## 2023-01-29 ENCOUNTER — Ambulatory Visit: Payer: PPO | Admitting: Nurse Practitioner

## 2023-01-29 ENCOUNTER — Ambulatory Visit: Payer: PPO

## 2023-01-29 ENCOUNTER — Other Ambulatory Visit: Payer: PPO

## 2023-02-12 ENCOUNTER — Inpatient Hospital Stay: Payer: PPO

## 2023-02-12 ENCOUNTER — Other Ambulatory Visit: Payer: Self-pay

## 2023-02-12 ENCOUNTER — Inpatient Hospital Stay: Payer: PPO | Attending: Physician Assistant

## 2023-02-12 ENCOUNTER — Inpatient Hospital Stay: Payer: PPO | Attending: Physician Assistant | Admitting: Hematology

## 2023-02-12 VITALS — BP 133/70 | HR 65 | Temp 97.6°F | Resp 15 | Ht 67.0 in | Wt 181.8 lb

## 2023-02-12 DIAGNOSIS — C241 Malignant neoplasm of ampulla of Vater: Secondary | ICD-10-CM

## 2023-02-12 DIAGNOSIS — K5521 Angiodysplasia of colon with hemorrhage: Secondary | ICD-10-CM

## 2023-02-12 DIAGNOSIS — Z5112 Encounter for antineoplastic immunotherapy: Secondary | ICD-10-CM | POA: Diagnosis present

## 2023-02-12 DIAGNOSIS — D519 Vitamin B12 deficiency anemia, unspecified: Secondary | ICD-10-CM

## 2023-02-12 DIAGNOSIS — D500 Iron deficiency anemia secondary to blood loss (chronic): Secondary | ICD-10-CM | POA: Diagnosis not present

## 2023-02-12 DIAGNOSIS — D649 Anemia, unspecified: Secondary | ICD-10-CM

## 2023-02-12 LAB — CMP (CANCER CENTER ONLY)
ALT: 37 U/L (ref 0–44)
AST: 41 U/L (ref 15–41)
Albumin: 3.6 g/dL (ref 3.5–5.0)
Alkaline Phosphatase: 117 U/L (ref 38–126)
Anion gap: 8 (ref 5–15)
BUN: 36 mg/dL — ABNORMAL HIGH (ref 8–23)
CO2: 26 mmol/L (ref 22–32)
Calcium: 10.1 mg/dL (ref 8.9–10.3)
Chloride: 105 mmol/L (ref 98–111)
Creatinine: 1.57 mg/dL — ABNORMAL HIGH (ref 0.61–1.24)
GFR, Estimated: 42 mL/min — ABNORMAL LOW (ref >60)
Glucose, Bld: 94 mg/dL (ref 70–99)
Potassium: 4 mmol/L (ref 3.5–5.1)
Sodium: 139 mmol/L (ref 135–145)
Total Bilirubin: 0.4 mg/dL (ref 0.0–1.2)
Total Protein: 7.4 g/dL (ref 6.5–8.1)

## 2023-02-12 LAB — CBC WITH DIFFERENTIAL (CANCER CENTER ONLY)
Abs Immature Granulocytes: 0.02 10*3/uL (ref 0.00–0.07)
Basophils Absolute: 0 10*3/uL (ref 0.0–0.1)
Basophils Relative: 0 %
Eosinophils Absolute: 0.2 10*3/uL (ref 0.0–0.5)
Eosinophils Relative: 3 %
HCT: 33.3 % — ABNORMAL LOW (ref 39.0–52.0)
Hemoglobin: 10.9 g/dL — ABNORMAL LOW (ref 13.0–17.0)
Immature Granulocytes: 0 %
Lymphocytes Relative: 18 %
Lymphs Abs: 1 10*3/uL (ref 0.7–4.0)
MCH: 33.5 pg (ref 26.0–34.0)
MCHC: 32.7 g/dL (ref 30.0–36.0)
MCV: 102.5 fL — ABNORMAL HIGH (ref 80.0–100.0)
Monocytes Absolute: 0.6 10*3/uL (ref 0.1–1.0)
Monocytes Relative: 11 %
Neutro Abs: 3.9 10*3/uL (ref 1.7–7.7)
Neutrophils Relative %: 68 %
Platelet Count: 201 10*3/uL (ref 150–400)
RBC: 3.25 MIL/uL — ABNORMAL LOW (ref 4.22–5.81)
RDW: 17.5 % — ABNORMAL HIGH (ref 11.5–15.5)
WBC Count: 5.8 10*3/uL (ref 4.0–10.5)
nRBC: 0 % (ref 0.0–0.2)

## 2023-02-12 LAB — FERRITIN: Ferritin: 93 ng/mL (ref 24–336)

## 2023-02-12 MED ORDER — SODIUM CHLORIDE 0.9 % IV SOLN: Freq: Once | INTRAVENOUS | Status: AC

## 2023-02-12 MED ORDER — SODIUM CHLORIDE 0.9 % IV SOLN
5.0000 mg/kg | Freq: Once | INTRAVENOUS | Status: AC
  Administered 2023-02-12: 400 mg via INTRAVENOUS
  Filled 2023-02-12: qty 16

## 2023-02-12 MED ORDER — CYANOCOBALAMIN 1000 MCG/ML IJ SOLN
1000.0000 ug | Freq: Once | INTRAMUSCULAR | Status: AC
  Administered 2023-02-12: 1000 ug via INTRAMUSCULAR
  Filled 2023-02-12: qty 1

## 2023-02-12 NOTE — Progress Notes (Signed)
[facility]   [phone] Fax:(336) [phone]   Clinic Follow up Note   Patient Care Team: [name], MD as PCP - General (Internal Medicine) [name], MD as PCP - Cardiology (Cardiology) [name], MD as Consulting Physician (General Surgery) [name], MD as Consulting Physician (Gastroenterology) [name], MD as Consulting Physician (Otolaryngology) [name], MD as Consulting Physician (Cardiology) Suzi Roots as Physician Assistant (Dermatology) Malachy Mood, MD as Consulting Physician (Oncology)  Date of Service:  02/12/2023  CHIEF COMPLAINT: f/u of anemia and ampulla carcinoma  CURRENT THERAPY:  Bevacizumab every 4 weeks  Oncology History   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  Foundation One showed no targetable mutations. -He completed RT on 04/09/2022 -Due to advanced age, I do not plan to offer him chemotherapy -CT scan in June 2024 showed no evidence of residual disease or new metastasis, although CT is not ideal to evaluate the residual disease in his case.        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  -He has required multiple blood transfusions -We discussed the benefit and side effect of bevacizumab for AVM related GI bleeding, he started on 09/11/22, he received every 2 weeks for total of 4 cycles. -due to good response to beva and persistent anemia, will continue beva every 4 weeks as maintenance therapy    Assessment and Plan    Ampullary Carcinoma Ampullary carcinoma, clinically stable. No recent CT scan since  June; asymptomatic with normal tumor markers. Imaging not required unless symptoms suggest progression. - Monitor for symptoms such as pain, nausea, and difficulty eating - Consider CT scan if symptoms suggest tumor progression - Check tumor markers regularly  Anemia Chronic anemia, well-managed with Avastin. Hemoglobin stable above 10, latest at 10.9. No transfusion needed. No significant side effects; blood pressure stable. Risks of Avastin include hypertension and proteinuria, not observed. Benefits include maintaining hemoglobin and reducing transfusions. Discussed extending treatment interval to five weeks, potentially six based on response. - Continue Avastin maintenance therapy, and Xtandi 2 every five weeks  - Monitor blood counts and blood pressure regularly - Check for proteinuria periodically  Plan -Lab reviewed, adequate for treatment, will proceed bevacizumab today, and extend from 4 weeks to 5 weeks giving overall improved anemia -f/u in 5 weeks         SUMMARY OF ONCOLOGIC HISTORY: Oncology History  Cancer of ampulla of Vater (HCC)  12/06/2021 Imaging   CT ABDOMEN PELVIS W CONTRAST   IMPRESSION: 1. There is moderate intrahepatic bile duct dilatation with marked fusiform dilatation of the common bile duct. No CT visible common bile duct stones identified. Differential considerations include a obstructing distal common bile duct stone (not visible by CT), distal CBD stricture, or mass at the level of the ampullary. Consider further evaluation with contrast enhanced MRI/MRCP or ERCP. 2. Contour the liver appears nodular which may reflect underlying cirrhosis. 3. Age-indeterminate compression fracture involving L1 vertebral body with loss of 50% of the vertebral body height. No signs of retropulsion of fracture fragments into the canal. 4. Fat containing umbilical hernia. 5. 5  mm anterolisthesis of L4 on L5. 6.  Aortic Atherosclerosis (ICD10-I70.0).   12/16/2021  Imaging   MR ABDOMEN MRCP W WO CONTAST   IMPRESSION: 1. Moderate intrahepatic and extrahepatic bile duct dilation with smooth tapering to the level of the ampulla without filling defect or focal mass lesion identified. Findings may reflect ampullary stricture or occult ampullary mass. Consider further evaluation with ERCP. 2. Mild dilated main pancreatic duct at the head measuring up to 8 mm. 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 Recommend follow-up pre and post-contrast MRI/MRCP in 2 years. This recommendation follows ACR consensus guidelines: Management of Incidental Pancreatic Cysts: A White Paper of the ACR Incidental Findings Committee. J Am Coll Radiol 2017;14:911-923. 3.  Aortic Atherosclerosis (ICD10-I70.0).   01/23/2022 Procedure   DG ERCP   IMPRESSION: Dilatation of the extrahepatic bile duct with severe tapering or narrowing in the distal common bile duct. Placement of biliary stent.   These images were submitted for radiologic interpretation only. Please see the procedural report for the amount of contrast.    01/23/2022 Procedure   EUS/ERCP  ENDOSONOGRAPHIC FINDING: There was dilation in the common bile duct (12.3 mm -> 20.4 mm) and in the common hepatic duct (23.0 mm). A small amount of hyperechoic material consistent with sludge was visualized endosonographically in the common bile duct. Moderate hyperechoic material consistent with sludge was visualized endosonographically in the gallbladder with normal gallbladder wall thickness. Pancreatic parenchymal abnormalities were noted in the entire pancreas. These consisted of hyperechoic foci with shadowing and cysts. There was a 9.1 mm by 6.1 mm cyst in the neck of the pancreas. There was a 15.3 mm by 14.1 mm cyst in the tail of the pancreas. The pancreatic duct had a dilated endosonographic appearance, had a  prominently branched endosonographic appearance and had hyperechoic walls in the pancreatic head (PD - 8.2 mm -> 4.3 mm), genu of the pancreas (3.5 mm), body of the pancreas (3.2 mm) and tail of the pancreas (2.8 mm). A hypoechoic irregular lesion was identified endosonographically at the ampulla. The lesion measured 15 mm by 13 mm in maximal cross-sectional diameter. The lesion extended from the mucosa to the submucosa. The outer margins were irregular. This is noted where CBD and PD dilation is noted to occur. Endosonographic imaging in the visualized portion of the liver showed no mass.  No malignant-appearing lymph nodes were visualized in the celiac region (level 20), peripancreatic region and porta hepatis region. The celiac region was visualized.   01/23/2022 Pathology Results   SURGICAL PATHOLOGY  CASE: [id number]  PATIENT: Carl Woods  Surgical Pathology Report   FINAL MICROSCOPIC DIAGNOSIS:   A. STOMACH, RANDOM, BIOPSY:  - Gastric mucosa, no significant abnormality.  No inflammation,  intestinal metaplasia, dysplasia or malignancy.   B. AMPULLARY LESION, BIOPSY:  - Adenocarcinoma.  See comment.    01/31/2022 Initial Diagnosis   Cancer of ampulla of Vater (HCC)   02/07/2022 Imaging    IMPRESSION: Interval increase in size of lymph node in the anterior cardiophrenic space on the right.   Increase small right pleural effusion compared to the previous MRI.   Subtle nodular tissue along the margin of the right hepatic lobe in the upper abdomen posteriorly. Recommend additional workup when appropriate.   Calcified pleural plaques.   Evidence of chronic liver disease.   Aortic Atherosclerosis (ICD10-I70.0).   02/14/2022 Miscellaneous   Foundation One:  Biomarker Findings Microsatellite status-Cannot be  determined Tumor Mutation Burden-Cannot be determined  Genomics Findings EPHB4 amplificatio SF3B1 [id number] [id number]       Discussed the use of AI scribe  software for clinical note transcription with the patient, who gave verbal consent to proceed.  History of Present Illness   The patient, an 89 year old male with a history of anemia and ampullary carcinoma, reports an overall improvement in his strength and mobility. He mentions occasional unease in his stomach after meals, but denies any severe nausea or vomiting. He has been on Avastin, which has helped maintain his hemoglobin levels above ten, reducing the need for blood transfusions. The patient has not reported any adverse effects from the medication. He has not had a CT scan since the previous summer, but is currently asymptomatic and prefers not to undergo a scan unless new symptoms arise.         All other systems were reviewed with the patient and are negative.  MEDICAL HISTORY:  Past Medical History:  Diagnosis Date   Anal fistula    Arthritis    Fingers and hands   Atrial fibrillation (HCC)    AVM (arteriovenous malformation)    Clipped during Colonoscopy 05/2016   Barrett's esophagus    Bilateral cataracts    BPH (benign prostatic hyperplasia)    Cecal angiodysplasia 05/28/2016   ablated at colonoscopy   Deviated nasal septum    Diverticulosis of sigmoid colon    E. 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   Left Forehead (treatment after biopsy)   OSA on CPAP    Perianal rash    Recurrent epistaxis    Renal cyst 01/04/2013   Small left peripelvic renal cysts , noted on US Renal   SCCA (squamous cell carcinoma) of skin 10/17/2015   Left Sup Bridge of Nose (curet, cautery and 5FU)   Seasonal allergies    Superficial basal cell carcinoma (BCC) 10/17/2015   Left Bulb of Nose (curet, cautery and 5FU)   Tubular adenoma     SURGICAL HISTORY: Past Surgical History:  Procedure Laterality Date   BILIARY STENT  PLACEMENT N/A 01/23/2022   Procedure: BILIARY STENT PLACEMENT;  Surgeon: Lemar Lofty., MD;  Location: [facility];  Service: Gastroenterology;  Laterality: N/A;   BILIARY STENT PLACEMENT N/A 08/06/2022   Procedure: BILIARY STENT PLACEMENT;  Surgeon: Lynann Bologna, MD;  Location: [facility];  Service: Gastroenterology;  Laterality: N/A;   BIOPSY  01/23/2022   Procedure: BIOPSY;  Surgeon: Meridee Score Netty Starring., MD;  Location: [facility];  Service: Gastroenterology;;   BIOPSY  07/09/2022   Procedure: BIOPSY;  Surgeon: Shellia Cleverly, DO;  Location: [facility];  Service: Gastroenterology;;   BIOPSY  08/02/2022   Procedure: BIOPSY;  Surgeon: Sherrilyn Rist, MD;  Location: [facility];  Service: Gastroenterology;;   COLONOSCOPY  multiple   CYSTOSCOPY     ENDOSCOPIC RETROGRADE CHOLANGIOPANCREATOGRAPHY (ERCP) WITH PROPOFOL N/A 01/23/2022   Procedure: ENDOSCOPIC RETROGRADE CHOLANGIOPANCREATOGRAPHY (ERCP) WITH PROPOFOL;  Surgeon: Lemar Lofty., MD;  Location: [facility];  Service: Gastroenterology;  Laterality: N/A;   ENTEROSCOPY N/A 08/02/2022   Procedure: ENTEROSCOPY;  Surgeon: Sherrilyn Rist, MD;  Location: [facility];  Service: Gastroenterology;  Laterality: N/A;   ERCP N/A 08/06/2022   Procedure: ENDOSCOPIC RETROGRADE CHOLANGIOPANCREATOGRAPHY (ERCP);  Surgeon: Lynann Bologna,  MD;  Location: [facility];  Service: Gastroenterology;  Laterality: N/A;   ESOPHAGOGASTRODUODENOSCOPY  multiple   ESOPHAGOGASTRODUODENOSCOPY N/A 01/23/2022   Procedure: ESOPHAGOGASTRODUODENOSCOPY (EGD);  Surgeon: Lemar Lofty., MD;  Location: [facility];  Service: Gastroenterology;  Laterality: N/A;   ESOPHAGOGASTRODUODENOSCOPY N/A 07/09/2022   Procedure: ESOPHAGOGASTRODUODENOSCOPY (EGD);  Surgeon: Shellia Cleverly, DO;  Location: [facility];  Service: Gastroenterology;  Laterality: N/A;   ESOPHAGOGASTRODUODENOSCOPY N/A 08/21/2022   Procedure: ESOPHAGOGASTRODUODENOSCOPY  (EGD);  Surgeon: Jenel Lucks, MD;  Location: [facility];  Service: Gastroenterology;  Laterality: N/A;   EUS N/A 01/23/2022   Procedure: UPPER ENDOSCOPIC ULTRASOUND (EUS) RADIAL;  Surgeon: Lemar Lofty., MD;  Location: [facility];  Service: Gastroenterology;  Laterality: N/A;   EVALUATION UNDER ANESTHESIA WITH FISTULECTOMY N/A 03/02/2018   Procedure: ANORECTAL EXAM UNDER ANESTHESIA WITH REPAIR OF SUPERFICIAL PERIRECTAL FISTULA AND HEMORRHOIDECTOMY;  Surgeon: [name], MD;  Location: [facility];  Service: General;  Laterality: N/A;   EXPLORATORY LAPAROTOMY     HEMOSTASIS CLIP PLACEMENT  07/09/2022   Procedure: HEMOSTASIS CLIP PLACEMENT;  Surgeon: Shellia Cleverly, DO;  Location: [facility];  Service: Gastroenterology;;   HOT HEMOSTASIS N/A 08/02/2022   Procedure: HOT HEMOSTASIS (ARGON PLASMA COAGULATION/BICAP);  Surgeon: Sherrilyn Rist, MD;  Location: [facility];  Service: Gastroenterology;  Laterality: N/A;   HOT HEMOSTASIS N/A 08/21/2022   Procedure: HOT HEMOSTASIS (ARGON PLASMA COAGULATION/BICAP);  Surgeon: Jenel Lucks, MD;  Location: [facility]COPY;  Service: Gastroenterology;  Laterality: N/A;   IR THORACENTESIS ASP PLEURAL SPACE W/IMG GUIDE  07/10/2022   REMOVAL OF STONES  01/23/2022   Procedure: REMOVAL OF STONES;  Surgeon: Meridee Score Netty Starring., MD;  Location: [facility];  Service: Gastroenterology;;   REMOVAL OF STONES  08/06/2022   Procedure: REMOVAL OF STONES;  Surgeon: Lynann Bologna, MD;  Location: [facility];  Service: Gastroenterology;;   Dennison Mascot  01/23/2022   Procedure: Dennison Mascot;  Surgeon: Mansouraty, Netty Starring., MD;  Location: [facility];  Service: Gastroenterology;;    I have reviewed the social history and family history with the patient and they are unchanged from previous note.  ALLERGIES:  is allergic to nsaids.  MEDICATIONS:  Current Outpatient Medications  Medication Sig Dispense Refill   acetaminophen (TYLENOL)  325 MG tablet Take 650 mg by mouth every 4 (four) hours as needed for fever or moderate pain (pain score 4-6).     bumetanide (BUMEX) 1 MG tablet Take 1 mg by mouth in the morning, at noon, in the evening, and at bedtime.     cetirizine (ZYRTEC) 10 MG tablet Take 10 mg by mouth daily as needed for allergies.     Cholecalciferol (VITAMIN D3) 5000 units CAPS Take 5,000 Units by mouth every morning.     dapagliflozin propanediol (FARXIGA) 5 MG TABS tablet Take 1 tablet (5 mg total) by mouth in the morning.     Glucosamine 500 MG CAPS Take 1 capsule by mouth every morning.     levothyroxine (SYNTHROID) 137 MCG tablet Take 1 tablet (137 mcg total) by mouth daily before breakfast.     Nutritional Supplements (BOOST PLUS PO) Take 1 Can by mouth once. 1 can orally one time a day for weight loss     nystatin cream (MYCOSTATIN) Apply 1 Application topically every 8 (eight) hours as needed (Yeast).     olopatadine (PATANOL) 0.1 % ophthalmic solution Place 1 drop into both eyes daily as needed for allergies.     ondansetron (ZOFRAN) 8 MG tablet Take 1  tablet (8 mg total) by mouth every 8 (eight) hours as needed for nausea or vomiting. 20 tablet 0   pantoprazole (PROTONIX) 40 MG tablet Take 1 tablet (40 mg total) by mouth 2 (two) times daily.     potassium chloride SA (KLOR-CON M) 20 MEQ tablet Take 20 mEq by mouth 2 (two) times daily.     sucralfate (CARAFATE) 1 g tablet Take 1 g by mouth 4 (four) times daily -  with meals and at bedtime.     Zinc Oxide 10 % OINT Apply 1 Application topically as needed.     No current facility-administered medications for this visit.   Facility-Administered Medications Ordered in Other Visits  Medication Dose Route Frequency Provider Last Rate Last Admin   bevacizumab-adcd (VEGZELMA) 400 mg in sodium chloride 0.9 % 100 mL chemo infusion  5 mg/kg (Treatment Plan Recorded) Intravenous Once Malachy Mood, MD       cyanocobalamin (VITAMIN B12) injection 1,000 mcg  1,000 mcg  Intramuscular Once Malachy Mood, MD        PHYSICAL EXAMINATION: ECOG PERFORMANCE STATUS: 2 - Symptomatic, <50% confined to bed  Vitals:   02/12/23 1342  BP: 133/70  Pulse: 65  Resp: 15  Temp: 97.6 F (36.4 C)  SpO2: 100%   Wt Readings from Last 3 Encounters:  02/12/23 181 lb 12.8 oz (82.5 kg)  01/23/23 165 lb 14.4 oz (75.3 kg)  01/16/23 182 lb 9.6 oz (82.8 kg)     GENERAL:alert, no distress and comfortable SKIN: skin color, texture, turgor are normal, no rashes or significant lesions EYES: normal, Conjunctiva are pink and non-injected, sclera clear NECK: supple, thyroid normal size, non-tender, without nodularity LYMPH:  no palpable lymphadenopathy in the cervical, axillary  LUNGS: clear to auscultation and percussion with normal breathing effort HEART: regular rate & rhythm and no murmurs and no lower extremity edema ABDOMEN:abdomen soft, non-tender and normal bowel sounds Musculoskeletal:no cyanosis of digits and no clubbing  NEURO: alert & oriented x 3 with fluent speech, no focal motor/sensory deficits   LABORATORY DATA:  I have reviewed the data as listed    Latest Ref Rng & Units 02/12/2023    1:13 PM 01/16/2023   12:40 PM 12/18/2022   12:22 PM  CBC  WBC 4.0 - 10.5 K/uL 5.8  4.4  4.4   Hemoglobin 13.0 - 17.0 g/dL 60.4  54.0  9.6   Hematocrit 39.0 - 52.0 % 33.3  30.3  29.3   Platelets 150 - 400 K/uL 201  171  168         Latest Ref Rng & Units 02/12/2023    1:13 PM 01/16/2023   12:40 PM 12/18/2022   12:22 PM  CMP  Glucose 70 - 99 mg/dL 94  84  981   BUN 8 - 23 mg/dL 36  31  28   Creatinine 0.61 - 1.24 mg/dL 1.91  4.78  2.95   Sodium 135 - 145 mmol/L 139  141  139   Potassium 3.5 - 5.1 mmol/L 4.0  4.1  4.1   Chloride 98 - 111 mmol/L 105  106  106   CO2 22 - 32 mmol/L 26  27  26    Calcium 8.9 - 10.3 mg/dL 62.1  9.7  9.5   Total Protein 6.5 - 8.1 g/dL 7.4  6.8  6.5   Total Bilirubin 0.0 - 1.2 mg/dL 0.4  0.5  0.5   Alkaline Phos 38 - 126 U/L 117  105  128  AST 15 - 41 U/L 41  35  51   ALT 0 - 44 U/L 37  31  53       RADIOGRAPHIC STUDIES: I have personally reviewed the radiological images as listed and agreed with the findings in the report. No results found.    Orders Placed This Encounter  Procedures   CBC with Differential (Cancer Center Only)    Standing Status:   Future    Expected Date:   03/19/2023    Expiration Date:   03/18/2024   All questions were answered. The patient knows to call the clinic with any problems, questions or concerns. No barriers to learning was detected. The total time spent in the appointment was 25 minutes.     Malachy Mood, MD 02/12/2023

## 2023-02-12 NOTE — Assessment & Plan Note (Signed)
-  cTxN0M0, diagnosed in 12/2021, MMR normal  -I reviewed PET scan findings, which showed no evidence of distant metastasis  --I reviewed his molecular testing, MMR was normal, he is not a candidate for immunotherapy.  Foundation One showed no targetable mutations. -He completed RT on 04/09/2022 -Due to advanced age, I do not plan to offer him chemotherapy -CT scan in June 2024 showed no evidence of residual disease or new metastasis, although CT is not ideal to evaluate the residual disease in his case.

## 2023-02-12 NOTE — Assessment & Plan Note (Signed)
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  -He has required multiple blood transfusions -We discussed the benefit and side effect of bevacizumab for AVM related GI bleeding, he started on 09/11/22, he received every 2 weeks for total of 4 cycles. -due to good response to beva and persistent anemia, will continue beva every 4 weeks as maintenance therapy

## 2023-02-12 NOTE — Progress Notes (Signed)
Ok to proceed without a urine protein today per Dr. Mosetta Putt, and we will plan to get one with next cycle. Order added to Cycle 8.

## 2023-02-13 ENCOUNTER — Other Ambulatory Visit: Payer: Self-pay

## 2023-02-13 LAB — CANCER ANTIGEN 19-9: CA 19-9: 27 U/mL (ref 0–35)

## 2023-02-26 ENCOUNTER — Ambulatory Visit: Payer: PPO | Admitting: Nurse Practitioner

## 2023-02-26 ENCOUNTER — Encounter: Payer: Self-pay | Admitting: Nurse Practitioner

## 2023-02-26 ENCOUNTER — Telehealth: Payer: Self-pay

## 2023-02-26 VITALS — BP 130/52 | HR 94 | Ht 65.0 in | Wt 180.0 lb

## 2023-02-26 DIAGNOSIS — G4733 Obstructive sleep apnea (adult) (pediatric): Secondary | ICD-10-CM

## 2023-02-26 NOTE — Progress Notes (Unsigned)
@Patient  ID: Carl Woods, male    DOB: Dec 02, 1931, 89 y.o.   MRN: [id number]  Chief Complaint  Patient presents with   Follow-up    Referring provider: [name], MD  HPI: 89 year old male, former smoker referred for sleep consult. Past medical history significant for CHF, HTN, hx of MI, OSA on CPAP, Barrett's esophagus, GERD, hypothyroid, CKD, IDA. 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 He completed radiation treatment.  TEST/EVENTS:   01/07/2023: Today - sleep consult Patient presents today with his son to establish care for OSA on CPAP.  He has a history of sleep apnea, diagnosed sometime around 2012.  He has been on CPAP since then.  Unfortunately, his CPAP broke about 2 months ago.  He had had the original machine that he had gotten after his for sleep study.  He has not had much in ways of formal follow-up after this.  Since he has been off CPAP, he is having difficulties with his sleep.  He used to sleep much better and was able to sleep more restfully at night.  He has been told that he snores. His wife told him without the CPAP, he stopped breathing when he slept. This is what initially prompted his workup for sleep apnea.  He does not currently drive.  Denies any morning headaches, sleep parasomnia/paralysis.  Wants to get back on CPAP therapy. Typically with CPAP, he was getting about 6 to 8 hours of sleep at night.  Without the CPAP, he has very fragmented sleep and feels more tired during the day.  He does not take any sleep aids.  His weight is down about 70 pounds since his last sleep study he thinks.  He did not notice any issues with his pressures on his CPAP when he was using it even though he had significant weight loss.  He was set at 10 cm of water and wore a nasal mask.  Does not have his previous sleep study results.  The medical supply company that he used to use has since gone out of business. He is living at friend's home.  He is currently on the skilled  living side but has plans to move to assisted living.  He is a former smoker.  Seldomly drinks alcohol.  No excessive caffeine intake.   Allergies  Allergen Reactions   Nsaids Other (See Comments)    Patient is to not take these because of kidney issues    Immunization History  Administered Date(s) Administered   Fluad Quad(high Dose 65+) 11/26/2022   Influenza, Quadrivalent, Recombinant, Inj, Pf 10/04/2020   Moderna Covid-19 Vaccine Bivalent Booster 71yrs & up 12/03/2022   PFIZER Comirnaty(Gray Top)Covid-19 Tri-Sucrose Vaccine 03/05/2019, 03/28/2019   PPD Test 08/30/2022, 09/13/2022   Pfizer Covid-19 Vaccine Bivalent Booster 35yrs & up 01/12/2020   Zoster Recombinant(Shingrix) 04/18/2021, 08/20/2021    Past Medical History:  Diagnosis Date   Anal fistula    Arthritis    Fingers and hands   Atrial fibrillation (HCC)    AVM (arteriovenous malformation)    Clipped during Colonoscopy 05/2016   Barrett's esophagus    Bilateral cataracts    BPH (benign prostatic hyperplasia)    Cecal angiodysplasia 05/28/2016   ablated at colonoscopy   Deviated nasal septum    Diverticulosis of sigmoid colon    E. coli infection    Fatty liver    GERD (gastroesophageal reflux disease)    History of colon polyps    Hypothyroidism  Iron deficiency anemia    Nodular basal cell carcinoma (BCC) 11/05/2017   Left Forehead (treatment after biopsy)   OSA on CPAP    Perianal rash    Recurrent epistaxis    Renal cyst 01/04/2013   Small left peripelvic renal cysts , noted on US Renal   SCCA (squamous cell carcinoma) of skin 10/17/2015   Left Sup Bridge of Nose (curet, cautery and 5FU)   Seasonal allergies    Superficial basal cell carcinoma (BCC) 10/17/2015   Left Bulb of Nose (curet, cautery and 5FU)   Tubular adenoma     Tobacco History: Social History   Tobacco Use  Smoking Status Former   Current packs/day: 0.00   Average packs/day: 0.5 packs/day for 40.0 years (20.0 ttl pk-yrs)    Types: Cigarettes   Start date: 1966   Quit date: 2006   Years since quitting: 19.0  Smokeless Tobacco Never   Counseling given: Not Answered   Outpatient Medications Prior to Visit  Medication Sig Dispense Refill   acetaminophen (TYLENOL) 325 MG tablet Take 650 mg by mouth every 4 (four) hours as needed for fever or moderate pain (pain score 4-6).     bumetanide (BUMEX) 1 MG tablet Take 1 mg by mouth in the morning, at noon, in the evening, and at bedtime.     cetirizine (ZYRTEC) 10 MG tablet Take 10 mg by mouth daily as needed for allergies.     Cholecalciferol (VITAMIN D3) 5000 units CAPS Take 5,000 Units by mouth every morning.     dapagliflozin propanediol (FARXIGA) 5 MG TABS tablet Take 1 tablet (5 mg total) by mouth in the morning.     Glucosamine 500 MG CAPS Take 1 capsule by mouth every morning.     levothyroxine (SYNTHROID) 137 MCG tablet Take 1 tablet (137 mcg total) by mouth daily before breakfast.     Nutritional Supplements (BOOST PLUS PO) Take 1 Can by mouth once. 1 can orally one time a day for weight loss     nystatin cream (MYCOSTATIN) Apply 1 Application topically every 8 (eight) hours as needed (Yeast).     olopatadine (PATANOL) 0.1 % ophthalmic solution Place 1 drop into both eyes daily as needed for allergies.     ondansetron (ZOFRAN) 8 MG tablet Take 1 tablet (8 mg total) by mouth every 8 (eight) hours as needed for nausea or vomiting. 20 tablet 0   pantoprazole (PROTONIX) 40 MG tablet Take 1 tablet (40 mg total) by mouth 2 (two) times daily.     potassium chloride SA (KLOR-CON M) 20 MEQ tablet Take 20 mEq by mouth 2 (two) times daily.     sucralfate (CARAFATE) 1 g tablet Take 1 g by mouth 4 (four) times daily -  with meals and at bedtime.     Zinc Oxide 10 % OINT Apply 1 Application topically as needed.     No facility-administered medications prior to visit.     Review of Systems:   Constitutional: No night sweats, fevers, chills, or lassitude. +fatigue,  weight loss (stabilized)  HEENT: No headaches, difficulty swallowing, tooth/dental problems, or sore throat. No sneezing, itching, ear ache, nasal congestion, or post nasal drip CV:  No chest pain, orthopnea, PND, swelling in lower extremities, anasarca, dizziness, palpitations, syncope Resp: +snoring. No shortness of breath with exertion or at rest. No excess mucus or change in color of mucus. No productive or non-productive. No hemoptysis. No wheezing.  No chest wall deformity GI:  No heartburn, indigestion,  abdominal pain, nausea, vomiting, diarrhea, change in bowel habits, loss of appetite, bloody stools.  GU: No nocturia  Skin: No rash, lesions, ulcerations MSK:  No joint pain or swelling.   Neuro: No dizziness or lightheadedness.  Psych: No depression or anxiety. Mood stable. +sleep disturbance    Physical Exam:  BP (!) 130/52 (BP Location: Right Arm, Patient Position: Sitting, Cuff Size: Normal)   Pulse 94   Ht 5\' 5"  (1.651 m)   Wt 180 lb (81.6 kg)   SpO2 98%   BMI 29.95 kg/m   GEN: Pleasant, interactive, well-kempt; elderly; in no acute distress HEENT:  Normocephalic and atraumatic. PERRLA. Sclera white. Nasal turbinates pink, moist and patent bilaterally. No rhinorrhea present. Oropharynx pink and moist, without exudate or edema. No lesions, ulcerations, or postnasal drip. Mallampati III NECK:  Supple w/ fair ROM. No JVD present. Normal carotid impulses w/o bruits. Thyroid symmetrical with no goiter or nodules palpated. No lymphadenopathy.   CV: RRR, no m/r/g, no peripheral edema. Pulses intact, +2 bilaterally. No cyanosis, pallor or clubbing. PULMONARY:  Unlabored, regular breathing. Clear bilaterally A&P w/o wheezes/rales/rhonchi. No accessory muscle use.  GI: BS present and normoactive. Soft, non-tender to palpation. No organomegaly or masses detected.  MSK: No erythema, warmth or tenderness. Cap refil <2 sec all extrem. No deformities or joint swelling noted.  Neuro: A/Ox3.  No focal deficits noted.   Skin: Warm, no lesions or rashe Psych: Normal affect and behavior. Judgement and thought content appropriate.     Lab Results:  CBC    Component Value Date/Time   WBC 5.8 02/12/2023 1313   WBC 4.4 01/16/2023 1240   RBC 3.25 (L) 02/12/2023 1313   HGB 10.9 (L) 02/12/2023 1313   HCT 33.3 (L) 02/12/2023 1313   PLT 201 02/12/2023 1313   MCV 102.5 (H) 02/12/2023 1313   MCH 33.5 02/12/2023 1313   MCHC 32.7 02/12/2023 1313   RDW 17.5 (H) 02/12/2023 1313   LYMPHSABS 1.0 02/12/2023 1313   MONOABS 0.6 02/12/2023 1313   EOSABS 0.2 02/12/2023 1313   BASOSABS 0.0 02/12/2023 1313    BMET    Component Value Date/Time   NA 139 02/12/2023 1313   K 4.0 02/12/2023 1313   CL 105 02/12/2023 1313   CO2 26 02/12/2023 1313   GLUCOSE 94 02/12/2023 1313   BUN 36 (H) 02/12/2023 1313   CREATININE 1.57 (H) 02/12/2023 1313   CALCIUM 10.1 02/12/2023 1313   GFRNONAA 42 (L) 02/12/2023 1313   GFRAA 55 (L) 02/23/2018 1345    BNP    Component Value Date/Time   BNP 358.5 (H) 07/10/2022 0139     Imaging:  No results found.  bevacizumab-adcd (VEGZELMA) 400 mg in sodium chloride 0.9 % 100 mL chemo infusion     Date Action Dose Route User   01/16/2023 1519 Infusion Verify (none) Intravenous Caesar Chestnut, RN   01/16/2023 1519 New Bag/Given 400 mg Intravenous Caesar Chestnut, RN      bevacizumab-adcd (VEGZELMA) 400 mg in sodium chloride 0.9 % 100 mL chemo infusion     Date Action Dose Route User   02/12/2023 1531 Infusion Verify (none) Intravenous Frutoso Schatz, RN   02/12/2023 1530 New Bag/Given 400 mg Intravenous Frutoso Schatz, RN      cyanocobalamin (VITAMIN B12) injection 1,000 mcg     Date Action Dose Route User   01/16/2023 1549 Given 1,000 mcg Intramuscular (Right Deltoid) Caesar Chestnut, RN      cyanocobalamin (VITAMIN B12)  injection 1,000 mcg     Date Action Dose Route User   02/12/2023 1532 Given 1,000 mcg Intramuscular (Left Deltoid) Frutoso Schatz, RN      0.9 %  sodium chloride infusion     Date Action Dose Route User   01/16/2023 1537 Rate/Dose Change (none) Intravenous Caesar Chestnut, RN   01/16/2023 1536 Rate/Dose Change (none) Intravenous Caesar Chestnut, RN   01/16/2023 1532 Rate/Dose Change (none) Intravenous Caesar Chestnut, RN   01/16/2023 1446 Rate/Dose Change (none) Intravenous Caesar Chestnut, RN   01/16/2023 1446 New Bag/Given (none) Intravenous Caesar Chestnut, RN      0.9 %  sodium chloride infusion     Date Action Dose Route User   02/12/2023 1547 Rate/Dose Change (none) Intravenous Frutoso Schatz, RN   02/12/2023 1543 Rate/Dose Change (none) Intravenous Frutoso Schatz, RN   02/12/2023 1447 New Bag/Given (none) Intravenous Hill, Ryan T, RN           No data to display          No results found for: "NITRICOXIDE"      Assessment & Plan:   No problem-specific Assessment & Plan notes found for this encounter.   Advised if symptoms do not improve or worsen, to please contact office for sooner follow up or seek emergency care.   I spent 35 minutes of dedicated to the care of this patient on the date of this encounter to include pre-visit review of records, face-to-face time with the patient discussing conditions above, post visit ordering of testing, clinical documentation with the electronic health record, making appropriate referrals as documented, and communicating necessary findings to members of the patients care team.  Noemi Chapel, NP 02/26/2023  Pt aware and understands NP's role.

## 2023-02-27 ENCOUNTER — Other Ambulatory Visit: Payer: Self-pay

## 2023-03-16 NOTE — Telephone Encounter (Signed)
 Patient states needs CPAP machine. Patient uses [facility]. Patient phone number is [phone].

## 2023-03-17 ENCOUNTER — Telehealth: Payer: Self-pay | Admitting: Nurse Practitioner

## 2023-03-17 DIAGNOSIS — G4733 Obstructive sleep apnea (adult) (pediatric): Secondary | ICD-10-CM

## 2023-03-17 NOTE — Telephone Encounter (Signed)
I have sent urgent message to [facility] asking them to check on this issue

## 2023-03-17 NOTE — Telephone Encounter (Signed)
 I received message from [place] with [facility] about the replacment cpap order placed on 01/13/23 please see below new order needs to be placed with certain adjustments RE: Replacement Cpap order placed 01/13/23 HST done 01/06/23 Received: Today Carl Woods   We need this order updated please. We need the order to not say in office setup. Patient has requested shipping, and with the order saying PT WILL NEED APPOINTMENT IN OFFICE WITH DME FOR CPAP SET UP we have to do in office setup.  please take off any request for in offivce or at DME setup messaging so we can send this tothe patient. See note from our intake team :  03/17/2023 Sent request for removal of setup at dme comment off the RX so we can ship device to patient in assisted living fac

## 2023-03-17 NOTE — Telephone Encounter (Signed)
 Foye Clock, can you update his order?   Synetta Fail, can we circle back on why the delay? It's been 2 months and I ordered it urgently. Thanks!

## 2023-03-18 NOTE — Assessment & Plan Note (Signed)
-  cTxN0M0, diagnosed in 12/2021, MMR normal  -I reviewed PET scan findings, which showed no evidence of distant metastasis  --I reviewed his molecular testing, MMR was normal, he is not a candidate for immunotherapy.  Foundation One showed no targetable mutations. -He completed RT on 04/09/2022 -Due to advanced age, I do not plan to offer him chemotherapy -CT scan in June 2024 showed no evidence of residual disease or new metastasis, although CT is not ideal to evaluate the residual disease in his case.

## 2023-03-18 NOTE — Telephone Encounter (Signed)
 Here is response from [place] with [facility] Kathyrn Sheriff   From what I can see from notes : Looks like I took the order on 01/15/23 325pm and sent to intake for review. 01/30/23 ( the next note) we got the auth and qualifying item verified and sent to scheduling. 02-02-23 First call for scheduling no answer left message. 02-05-23 call two left message 02-12-23 call / left message for call back 1/21 patient was contacted, and an email was sent to pap team for assistance 1/21-25 a conversation w/ patient took place, and the order was sent to qualification team. 1/21 CMN was logged signed and returned 02/18/23 re-auth requested with updated info 03-04-23 auth received 2-6- auth logged and call placed to patient. 03-16-23 patient returned call from facility. is in assisted living facility and requested shipping.   03-17-23 request for updated order sent to MD 03-17-23, I noticed message in order and sent message to [facility] for correction.  This is all the details I have based off notes. I hope this helps

## 2023-03-18 NOTE — Telephone Encounter (Signed)
 Here is Carl Woods with [facility] response New, Elige Radon   From what I can see from notes : Looks like I took the order on 01/15/23 325pm and sent to intake for review. 01/30/23 ( the next note) we got the auth and qualifying item verified and sent to scheduling. 02-02-23 First call for scheduling no answer left message. 02-05-23 call two left message 02-12-23 call / left message for call back 1/21 patient was contacted, and an email was sent to pap team for assistance 1/21-25 a conversation w/ patient took place, and the order was sent to qualification team. 1/21 CMN was logged signed and returned 02/18/23 re-auth requested with updated info 03-04-23 auth received 2-6- auth logged and call placed to patient. 03-16-23 patient returned call from facility. is in assisted living facility and requested shipping.   03-17-23 request for updated order sent to MD 03-17-23, I noticed message in order and sent message to [facility] for correction.  This is all the details I have based off notes. I hope this helps

## 2023-03-18 NOTE — Assessment & Plan Note (Signed)
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  -He has required multiple blood transfusions -We discussed the benefit and side effect of bevacizumab for AVM related GI bleeding, he started on 09/11/22, he received every 2 weeks for total of 4 cycles. -due to good response to beva and persistent anemia, will continue beva every 4 weeks as maintenance therapy

## 2023-03-18 NOTE — Telephone Encounter (Signed)
 Order has been updated.

## 2023-03-18 NOTE — Telephone Encounter (Signed)
 Carl Woods I have sent another message to [facility] with you question

## 2023-03-19 ENCOUNTER — Inpatient Hospital Stay: Payer: PPO | Attending: Physician Assistant

## 2023-03-19 ENCOUNTER — Inpatient Hospital Stay: Payer: PPO | Admitting: Hematology

## 2023-03-19 ENCOUNTER — Encounter: Payer: Self-pay | Admitting: Hematology

## 2023-03-19 VITALS — BP 133/55 | HR 75 | Temp 97.8°F | Resp 17 | Wt 188.5 lb

## 2023-03-19 DIAGNOSIS — C241 Malignant neoplasm of ampulla of Vater: Secondary | ICD-10-CM | POA: Diagnosis not present

## 2023-03-19 DIAGNOSIS — C170 Malignant neoplasm of duodenum: Secondary | ICD-10-CM | POA: Diagnosis not present

## 2023-03-19 DIAGNOSIS — Z5112 Encounter for antineoplastic immunotherapy: Secondary | ICD-10-CM | POA: Insufficient documentation

## 2023-03-19 DIAGNOSIS — D519 Vitamin B12 deficiency anemia, unspecified: Secondary | ICD-10-CM | POA: Diagnosis not present

## 2023-03-19 DIAGNOSIS — K5521 Angiodysplasia of colon with hemorrhage: Secondary | ICD-10-CM

## 2023-03-19 DIAGNOSIS — D500 Iron deficiency anemia secondary to blood loss (chronic): Secondary | ICD-10-CM

## 2023-03-19 DIAGNOSIS — D649 Anemia, unspecified: Secondary | ICD-10-CM

## 2023-03-19 LAB — CBC WITH DIFFERENTIAL (CANCER CENTER ONLY)
Abs Immature Granulocytes: 0.01 10*3/uL (ref 0.00–0.07)
Basophils Absolute: 0 10*3/uL (ref 0.0–0.1)
Basophils Relative: 1 %
Eosinophils Absolute: 0.2 10*3/uL (ref 0.0–0.5)
Eosinophils Relative: 3 %
HCT: 35.2 % — ABNORMAL LOW (ref 39.0–52.0)
Hemoglobin: 11.6 g/dL — ABNORMAL LOW (ref 13.0–17.0)
Immature Granulocytes: 0 %
Lymphocytes Relative: 22 %
Lymphs Abs: 1.1 10*3/uL (ref 0.7–4.0)
MCH: 34 pg (ref 26.0–34.0)
MCHC: 33 g/dL (ref 30.0–36.0)
MCV: 103.2 fL — ABNORMAL HIGH (ref 80.0–100.0)
Monocytes Absolute: 0.6 10*3/uL (ref 0.1–1.0)
Monocytes Relative: 12 %
Neutro Abs: 3 10*3/uL (ref 1.7–7.7)
Neutrophils Relative %: 62 %
Platelet Count: 152 10*3/uL (ref 150–400)
RBC: 3.41 MIL/uL — ABNORMAL LOW (ref 4.22–5.81)
RDW: 17.9 % — ABNORMAL HIGH (ref 11.5–15.5)
WBC Count: 5 10*3/uL (ref 4.0–10.5)
nRBC: 0 % (ref 0.0–0.2)

## 2023-03-19 LAB — SAMPLE TO BLOOD BANK

## 2023-03-19 LAB — TOTAL PROTEIN, URINE DIPSTICK: Protein, ur: NEGATIVE mg/dL

## 2023-03-19 MED ORDER — SODIUM CHLORIDE 0.9 % IV SOLN: Freq: Once | INTRAVENOUS | Status: AC

## 2023-03-19 MED ORDER — CYANOCOBALAMIN 1000 MCG/ML IJ SOLN
1000.0000 ug | Freq: Once | INTRAMUSCULAR | Status: AC
  Administered 2023-03-19: 1000 ug via INTRAMUSCULAR
  Filled 2023-03-19: qty 1

## 2023-03-19 MED ORDER — SODIUM CHLORIDE 0.9 % IV SOLN
5.0000 mg/kg | Freq: Once | INTRAVENOUS | Status: AC
  Administered 2023-03-19: 400 mg via INTRAVENOUS
  Filled 2023-03-19: qty 16

## 2023-03-19 NOTE — Patient Instructions (Signed)
 [facility] - A DEPT OF [facility]  Discharge Instructions: Thank you for choosing [facility] to provide your oncology and hematology care.   If you have a lab appointment with the Cancer Center, please go directly to the Cancer Center and check in at the registration area.   Wear comfortable clothing and clothing appropriate for easy access to any Portacath or PICC line.   We strive to give you quality time with your provider. You may need to reschedule your appointment if you arrive late (15 or more minutes).  Arriving late affects you and other patients whose appointments are after yours.  Also, if you miss three or more appointments without notifying the office, you may be dismissed from the clinic at the provider's discretion.      For prescription refill requests, have your pharmacy contact our office and allow 72 hours for refills to be completed.    Today you received the following chemotherapy and/or immunotherapy agents: Bevacizumab      To help prevent nausea and vomiting after your treatment, we encourage you to take your nausea medication as directed.  BELOW ARE SYMPTOMS THAT SHOULD BE REPORTED IMMEDIATELY: *FEVER GREATER THAN 100.4 F (38 C) OR HIGHER *CHILLS OR SWEATING *NAUSEA AND VOMITING THAT IS NOT CONTROLLED WITH YOUR NAUSEA MEDICATION *UNUSUAL SHORTNESS OF BREATH *UNUSUAL BRUISING OR BLEEDING *URINARY PROBLEMS (pain or burning when urinating, or frequent urination) *BOWEL PROBLEMS (unusual diarrhea, constipation, pain near the anus) TENDERNESS IN MOUTH AND THROAT WITH OR WITHOUT PRESENCE OF ULCERS (sore throat, sores in mouth, or a toothache) UNUSUAL RASH, SWELLING OR PAIN  UNUSUAL VAGINAL DISCHARGE OR ITCHING   Items with * indicate a potential emergency and should be followed up as soon as possible or go to the Emergency Department if any problems should occur.  Please show the CHEMOTHERAPY ALERT CARD or  IMMUNOTHERAPY ALERT CARD at check-in to the Emergency Department and triage nurse.  Should you have questions after your visit or need to cancel or reschedule your appointment, please contact [facility] - A DEPT OF [facility]  Dept: [phone]  and follow the prompts.  Office hours are 8:00 a.m. to 4:30 p.m. Monday - Friday. Please note that voicemails left after 4:00 p.m. may not be returned until the following business day.  We are closed weekends and major holidays. You have access to a nurse at all times for urgent questions. Please call the main number to the clinic Dept: [phone] and follow the prompts.   For any non-urgent questions, you may also contact your provider using MyChart. We now offer e-Visits for anyone 67 and older to request care online for non-urgent symptoms. For details visit [email or web address].   Also download the MyChart app! Go to the app store, search "MyChart", open the app, select [facility], and log in with your MyChart username and password.

## 2023-03-19 NOTE — Progress Notes (Signed)
 [facility]   Telephone:([phone]) [phone] Fax:(336) [phone]   Clinic Follow up Note   Patient Care Team: [name], MD as PCP - General (Internal Medicine) [name], MD as PCP - Cardiology (Cardiology) [name], MD as Consulting Physician (General Surgery) [name], MD as Consulting Physician (Gastroenterology) [name], MD as Consulting Physician (Otolaryngology) [name], MD as Consulting Physician (Cardiology) Suzi Roots as Physician Assistant (Dermatology) Malachy Mood, MD as Consulting Physician (Oncology)  Date of Service:  03/19/2023  CHIEF COMPLAINT: f/u of anemia from GI bleeding  CURRENT THERAPY:  Bevacizumab every 4 weeks  Oncology History   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  Foundation One showed no targetable mutations. -He completed RT on 04/09/2022 -Due to advanced age, I do not plan to offer him chemotherapy -CT scan in June 2024 showed no evidence of residual disease or new metastasis, although CT is not ideal to evaluate the residual disease in his case.        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  -He has required multiple blood transfusions -We discussed the benefit and side effect of bevacizumab for AVM related GI bleeding, he started on 09/11/22, he received every 2 weeks for total of 4 cycles. 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  Hemoglobin at 11.6 g/dL. Minor bleeding from an external anal sore, no changes in stool color or other signs of internal bleeding. Previous issues with extending infusion intervals to two months. Discussed and decided on extending bevacizumab infusion interval to six weeks based on history and preferences. 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 Last radiation treatment nearly a year ago. No immediate need for imaging unless symptoms arise. - Monitor symptoms - No immediate need for imaging unless symptoms arise  General Health Maintenance Generally well with no new complaints. Regular bowel movements, no stomach pain or nausea. Uses a walker and occasionally a wheelchair for mobility. - Schedule next appointment in six weeks - Provide written appointment details for transportation arrangements.      Plan -Lab reviewed, adequate for treatment, will proceed to bevacizumab today -His anemia has been very well-controlled, will change his bevacizumab to every 6 weeks -Follow-up in 6 weeks -I spoke with his daughter Randa Evens on the phone today   SUMMARY OF ONCOLOGIC HISTORY: Oncology History  Cancer of ampulla of Vater (HCC)  12/06/2021 Imaging   CT ABDOMEN PELVIS W CONTRAST   IMPRESSION: 1. There is moderate intrahepatic bile duct dilatation with marked fusiform dilatation of the common bile duct. No CT visible common bile duct stones identified. Differential considerations include a obstructing distal common bile duct stone (not visible by CT), distal CBD stricture, or mass at the level of the ampullary. Consider further evaluation with contrast enhanced MRI/MRCP or ERCP.  2. Contour the liver appears nodular which may reflect underlying cirrhosis. 3. Age-indeterminate compression fracture involving L1  vertebral body with loss of 50% of the vertebral body height. No signs of retropulsion of fracture fragments into the canal. 4. Fat containing umbilical hernia. 5. 5 mm anterolisthesis of L4 on L5. 6.  Aortic Atherosclerosis (ICD10-I70.0).   12/16/2021 Imaging   MR ABDOMEN MRCP W WO CONTAST   IMPRESSION: 1. Moderate intrahepatic and extrahepatic bile duct dilation with smooth tapering to the level of the ampulla without filling defect or focal mass lesion identified. Findings may reflect ampullary stricture or occult ampullary mass. Consider further evaluation with ERCP. 2. Mild dilated main pancreatic duct at the head measuring up to 8 mm. 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 Recommend follow-up pre and post-contrast MRI/MRCP in 2 years. This recommendation follows ACR consensus guidelines: Management of Incidental Pancreatic Cysts: A White Paper of the ACR Incidental Findings Committee. J Am Coll Radiol 2017;14:911-923. 3.  Aortic Atherosclerosis (ICD10-I70.0).   01/23/2022 Procedure   DG ERCP   IMPRESSION: Dilatation of the extrahepatic bile duct with severe tapering or narrowing in the distal common bile duct. Placement of biliary stent.   These images were submitted for radiologic interpretation only. Please see the procedural report for the amount of contrast.    01/23/2022 Procedure   EUS/ERCP  ENDOSONOGRAPHIC FINDING: There was dilation in the common bile duct (12.3 mm -> 20.4 mm) and in the common hepatic duct (23.0 mm). A small amount of hyperechoic material consistent with sludge was visualized endosonographically in the common bile duct. Moderate hyperechoic material consistent with sludge was visualized endosonographically in the gallbladder with normal gallbladder wall thickness. Pancreatic parenchymal abnormalities were noted in the entire pancreas. These  consisted of hyperechoic foci with shadowing and cysts. There was a 9.1 mm by 6.1 mm cyst in the neck of the pancreas. There was a 15.3 mm by 14.1 mm cyst in the tail of the pancreas. The pancreatic duct had a dilated endosonographic appearance, had a prominently branched endosonographic appearance and had hyperechoic walls in the pancreatic head (PD - 8.2 mm -> 4.3 mm), genu of the pancreas (3.5 mm), body of the pancreas (3.2 mm) and tail of the pancreas (2.8 mm). A hypoechoic irregular lesion was identified endosonographically at the ampulla. The lesion measured 15 mm by 13 mm in maximal cross-sectional diameter. The lesion extended from the mucosa to the submucosa. The outer margins were irregular. This is noted where CBD and PD dilation is noted to occur. Endosonographic imaging in the visualized portion of the liver showed no mass.  No malignant-appearing lymph nodes were visualized in the celiac region (level 20), peripancreatic region and porta hepatis region. The celiac region was visualized.   01/23/2022 Pathology Results   SURGICAL PATHOLOGY  CASE: [id number]  PATIENT: Carl Woods  Surgical Pathology Report   FINAL MICROSCOPIC DIAGNOSIS:   A. STOMACH, RANDOM, BIOPSY:  - Gastric mucosa, no significant abnormality.  No inflammation,  intestinal metaplasia, dysplasia or malignancy.   B. AMPULLARY LESION, BIOPSY:  - Adenocarcinoma.  See comment.    01/31/2022 Initial Diagnosis   Cancer of ampulla of Vater (HCC)   02/07/2022 Imaging    IMPRESSION: Interval increase in size of lymph node in the anterior cardiophrenic space on the right.   Increase small right pleural effusion compared to the previous MRI.   Subtle nodular tissue along the margin of  the right hepatic lobe in the upper abdomen posteriorly. Recommend additional workup when appropriate.   Calcified pleural plaques.   Evidence of chronic liver disease.   Aortic Atherosclerosis (ICD10-I70.0).    02/14/2022 Miscellaneous   Foundation One:  Biomarker Findings Microsatellite status-Cannot be determined Tumor Mutation Burden-Cannot be determined  Genomics Findings EPHB4 amplificatio SF3B1 [id number] [id number]       Discussed the use of AI scribe software for clinical note transcription with the patient, who gave verbal consent to proceed.  History of Present Illness   The patient, an 89 year old gentleman with a history of duodenal cancer and anemia secondary to gastrointestinal bleeding, reports no new symptoms or changes in his condition. He mentions a sore on the outside of his anus, which has caused some external bleeding. However, he has not noticed any changes in the color of his stool or any internal bleeding. He denies any other bleeding such as nose, gum, or mouth bleeding. His hemoglobin levels have improved to 11.6, and he is scheduled for a bevacizumab infusion today. The patient has not reported any stomach pain, nausea, or changes in bowel movements. He is not currently taking any oral medications from the doctor.         All other systems were reviewed with the patient and are negative.  MEDICAL HISTORY:  Past Medical History:  Diagnosis Date   Anal fistula    Arthritis    Fingers and hands   Atrial fibrillation (HCC)    AVM (arteriovenous malformation)    Clipped during Colonoscopy 05/2016   Barrett's esophagus    Bilateral cataracts    BPH (benign prostatic hyperplasia)    Cecal angiodysplasia 05/28/2016   ablated at colonoscopy   Deviated nasal septum    Diverticulosis of sigmoid colon    E. 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   Left Forehead (treatment after biopsy)   OSA on CPAP    Perianal rash    Recurrent epistaxis    Renal cyst 01/04/2013   Small left peripelvic renal cysts , noted on  US Renal   SCCA (squamous cell carcinoma) of skin 10/17/2015   Left Sup Bridge of Nose (curet, cautery and 5FU)   Seasonal allergies    Superficial basal cell carcinoma (BCC) 10/17/2015   Left Bulb of Nose (curet, cautery and 5FU)   Tubular adenoma     SURGICAL HISTORY: Past Surgical History:  Procedure Laterality Date   BILIARY STENT PLACEMENT N/A 01/23/2022   Procedure: BILIARY STENT PLACEMENT;  Surgeon: Lemar Lofty., MD;  Location: [facility];  Service: Gastroenterology;  Laterality: N/A;   BILIARY STENT PLACEMENT N/A 08/06/2022   Procedure: BILIARY STENT PLACEMENT;  Surgeon: Lynann Bologna, MD;  Location: [facility];  Service: Gastroenterology;  Laterality: N/A;   BIOPSY  01/23/2022   Procedure: BIOPSY;  Surgeon: Meridee Score Netty Starring., MD;  Location: [facility];  Service: Gastroenterology;;   BIOPSY  07/09/2022   Procedure: BIOPSY;  Surgeon: Shellia Cleverly, DO;  Location: [facility];  Service: Gastroenterology;;   BIOPSY  08/02/2022   Procedure: BIOPSY;  Surgeon: Sherrilyn Rist, MD;  Location: [facility];  Service: Gastroenterology;;   COLONOSCOPY  multiple   CYSTOSCOPY     ENDOSCOPIC RETROGRADE CHOLANGIOPANCREATOGRAPHY (ERCP) WITH PROPOFOL N/A 01/23/2022   Procedure: ENDOSCOPIC RETROGRADE  CHOLANGIOPANCREATOGRAPHY (ERCP) WITH PROPOFOL;  Surgeon: Mansouraty, Netty Starring., MD;  Location: [facility];  Service: Gastroenterology;  Laterality: N/A;   ENTEROSCOPY N/A 08/02/2022   Procedure: ENTEROSCOPY;  Surgeon: Sherrilyn Rist, MD;  Location: [facility];  Service: Gastroenterology;  Laterality: N/A;   ERCP N/A 08/06/2022   Procedure: ENDOSCOPIC RETROGRADE CHOLANGIOPANCREATOGRAPHY (ERCP);  Surgeon: Lynann Bologna, MD;  Location: [facility];  Service: Gastroenterology;  Laterality: N/A;   ESOPHAGOGASTRODUODENOSCOPY  multiple   ESOPHAGOGASTRODUODENOSCOPY N/A 01/23/2022   Procedure: ESOPHAGOGASTRODUODENOSCOPY (EGD);  Surgeon: Lemar Lofty., MD;   Location: [facility];  Service: Gastroenterology;  Laterality: N/A;   ESOPHAGOGASTRODUODENOSCOPY N/A 07/09/2022   Procedure: ESOPHAGOGASTRODUODENOSCOPY (EGD);  Surgeon: Shellia Cleverly, DO;  Location: [facility] ENDOSCOPY;  Service: Gastroenterology;  Laterality: N/A;   ESOPHAGOGASTRODUODENOSCOPY N/A 08/21/2022   Procedure: ESOPHAGOGASTRODUODENOSCOPY (EGD);  Surgeon: Jenel Lucks, MD;  Location: [facility] ENDOSCOPY;  Service: Gastroenterology;  Laterality: N/A;   EUS N/A 01/23/2022   Procedure: UPPER ENDOSCOPIC ULTRASOUND (EUS) RADIAL;  Surgeon: Lemar Lofty., MD;  Location: [facility];  Service: Gastroenterology;  Laterality: N/A;   EVALUATION UNDER ANESTHESIA WITH FISTULECTOMY N/A 03/02/2018   Procedure: ANORECTAL EXAM UNDER ANESTHESIA WITH REPAIR OF SUPERFICIAL PERIRECTAL FISTULA AND HEMORRHOIDECTOMY;  Surgeon: [name], MD;  Location: [facility];  Service: General;  Laterality: N/A;   EXPLORATORY LAPAROTOMY     HEMOSTASIS CLIP PLACEMENT  07/09/2022   Procedure: HEMOSTASIS CLIP PLACEMENT;  Surgeon: Shellia Cleverly, DO;  Location: [facility];  Service: Gastroenterology;;   HOT HEMOSTASIS N/A 08/02/2022   Procedure: HOT HEMOSTASIS (ARGON PLASMA COAGULATION/BICAP);  Surgeon: Sherrilyn Rist, MD;  Location: [facility];  Service: Gastroenterology;  Laterality: N/A;   HOT HEMOSTASIS N/A 08/21/2022   Procedure: HOT HEMOSTASIS (ARGON PLASMA COAGULATION/BICAP);  Surgeon: Jenel Lucks, MD;  Location: [facility] ENDOSCOPY;  Service: Gastroenterology;  Laterality: N/A;   IR THORACENTESIS ASP PLEURAL SPACE W/IMG GUIDE  07/10/2022   REMOVAL OF STONES  01/23/2022   Procedure: REMOVAL OF STONES;  Surgeon: Meridee Score Netty Starring., MD;  Location: [facility];  Service: Gastroenterology;;   REMOVAL OF STONES  08/06/2022   Procedure: REMOVAL OF STONES;  Surgeon: Lynann Bologna, MD;  Location: [facility];  Service: Gastroenterology;;   Dennison Mascot  01/23/2022   Procedure: Dennison Mascot;  Surgeon:  Mansouraty, Netty Starring., MD;  Location: [facility];  Service: Gastroenterology;;    I have reviewed the social history and family history with the patient and they are unchanged from previous note.  ALLERGIES:  is allergic to nsaids.  MEDICATIONS:  Current Outpatient Medications  Medication Sig Dispense Refill   acetaminophen (TYLENOL) 325 MG tablet Take 650 mg by mouth every 4 (four) hours as needed for fever or moderate pain (pain score 4-6).     bumetanide (BUMEX) 1 MG tablet Take 1 mg by mouth in the morning, at noon, in the evening, and at bedtime.     cetirizine (ZYRTEC) 10 MG tablet Take 10 mg by mouth daily as needed for allergies.     Cholecalciferol (VITAMIN D3) 5000 units CAPS Take 5,000 Units by mouth every morning.     dapagliflozin propanediol (FARXIGA) 5 MG TABS tablet Take 1 tablet (5 mg total) by mouth in the morning.     Glucosamine 500 MG CAPS Take 1 capsule by mouth every morning.     levothyroxine (SYNTHROID) 137 MCG tablet Take 1 tablet (137 mcg total) by mouth daily before breakfast.     Nutritional Supplements (BOOST PLUS PO) Take 1 Can by  mouth once. 1 can orally one time a day for weight loss     nystatin cream (MYCOSTATIN) Apply 1 Application topically every 8 (eight) hours as needed (Yeast).     olopatadine (PATANOL) 0.1 % ophthalmic solution Place 1 drop into both eyes daily as needed for allergies.     ondansetron (ZOFRAN) 8 MG tablet Take 1 tablet (8 mg total) by mouth every 8 (eight) hours as needed for nausea or vomiting. 20 tablet 0   pantoprazole (PROTONIX) 40 MG tablet Take 1 tablet (40 mg total) by mouth 2 (two) times daily.     potassium chloride SA (KLOR-CON M) 20 MEQ tablet Take 20 mEq by mouth 2 (two) times daily.     sucralfate (CARAFATE) 1 g tablet Take 1 g by mouth 4 (four) times daily -  with meals and at bedtime.     Zinc Oxide 10 % OINT Apply 1 Application topically as needed.     No current facility-administered medications for this visit.     PHYSICAL EXAMINATION: ECOG PERFORMANCE STATUS: 2 - Symptomatic, <50% confined to bed  Vitals:   03/19/23 1245  BP: (!) 133/55  Pulse: 75  Resp: 17  Temp: 97.8 F (36.6 C)  SpO2: 100%   Wt Readings from Last 3 Encounters:  03/19/23 188 lb 8 oz (85.5 kg)  02/26/23 180 lb (81.6 kg)  02/12/23 181 lb 12.8 oz (82.5 kg)     GENERAL:alert, no distress and comfortable SKIN: skin color, texture, turgor are normal, no rashes or significant lesions EYES: normal, Conjunctiva are pink and non-injected, sclera clear NECK: supple, thyroid normal size, non-tender, without nodularity LYMPH:  no palpable lymphadenopathy in the cervical, axillary  LUNGS: clear to auscultation and percussion with normal breathing effort HEART: regular rate & rhythm and no murmurs and no lower extremity edema ABDOMEN:abdomen soft, non-tender and normal bowel sounds Musculoskeletal:no cyanosis of digits and no clubbing  NEURO: alert & oriented x 3 with fluent speech, no focal motor/sensory deficits    LABORATORY DATA:  I have reviewed the data as listed    Latest Ref Rng & Units 03/19/2023   12:23 PM 02/12/2023    1:13 PM 01/16/2023   12:40 PM  CBC  WBC 4.0 - 10.5 K/uL 5.0  5.8  4.4   Hemoglobin 13.0 - 17.0 g/dL 16.1  09.6  04.5   Hematocrit 39.0 - 52.0 % 35.2  33.3  30.3   Platelets 150 - 400 K/uL 152  201  171         Latest Ref Rng & Units 02/12/2023    1:13 PM 01/16/2023   12:40 PM 12/18/2022   12:22 PM  CMP  Glucose 70 - 99 mg/dL 94  84  409   BUN 8 - 23 mg/dL 36  31  28   Creatinine 0.61 - 1.24 mg/dL 8.11  9.14  7.82   Sodium 135 - 145 mmol/L 139  141  139   Potassium 3.5 - 5.1 mmol/L 4.0  4.1  4.1   Chloride 98 - 111 mmol/L 105  106  106   CO2 22 - 32 mmol/L 26  27  26    Calcium 8.9 - 10.3 mg/dL 95.6  9.7  9.5   Total Protein 6.5 - 8.1 g/dL 7.4  6.8  6.5   Total Bilirubin 0.0 - 1.2 mg/dL 0.4  0.5  0.5   Alkaline Phos 38 - 126 U/L 117  105  128   AST 15 - 41 U/L  41  35  51   ALT 0 -  44 U/L 37  31  53       RADIOGRAPHIC STUDIES: I have personally reviewed the radiological images as listed and agreed with the findings in the report. No results found.    Orders Placed This Encounter  Procedures   Cancer antigen 19-9    Standing Status:   Standing    Number of Occurrences:   20    Expiration Date:   03/17/2024   CBC with Differential (Cancer Center Only)    Standing Status:   Future    Expected Date:   04/30/2023    Expiration Date:   04/29/2024   Total Protein, Urine dipstick    Standing Status:   Future    Expected Date:   04/30/2023    Expiration Date:   04/29/2024   CBC with Differential (Cancer Center Only)    Standing Status:   Future    Expected Date:   06/11/2023    Expiration Date:   06/10/2024   Total Protein, Urine dipstick    Standing Status:   Future    Expected Date:   06/11/2023    Expiration Date:   06/10/2024   All questions were answered. The patient knows to call the clinic with any problems, questions or concerns. No barriers to learning was detected. The total time spent in the appointment was 25 minutes.     Malachy Mood, MD 03/19/2023

## 2023-03-19 NOTE — Telephone Encounter (Signed)
 Order has already been updated with [facility]. We need to follow up with Carl Woods to get confirmation that they have shipped the patient's CPAP.

## 2023-03-19 NOTE — Telephone Encounter (Signed)
 Patient contacted the answering service requesting an updated order to [facility] without "appt". Can we contact the patient to provider follow up?

## 2023-03-20 ENCOUNTER — Other Ambulatory Visit: Payer: Self-pay

## 2023-03-20 LAB — FERRITIN: Ferritin: 65 ng/mL (ref 24–336)

## 2023-03-20 LAB — CANCER ANTIGEN 19-9: CA 19-9: 25 U/mL (ref 0–35)

## 2023-03-20 NOTE — Telephone Encounter (Signed)
 [facility] Woods has sent response he has the corrected order

## 2023-04-06 ENCOUNTER — Telehealth: Payer: Self-pay

## 2023-04-06 NOTE — Telephone Encounter (Signed)
 called about Cpap replacement,[facility] is working on it  [employer]

## 2023-04-09 ENCOUNTER — Non-Acute Institutional Stay: Payer: Self-pay | Admitting: Internal Medicine

## 2023-04-09 DIAGNOSIS — G4733 Obstructive sleep apnea (adult) (pediatric): Secondary | ICD-10-CM

## 2023-04-09 DIAGNOSIS — N1832 Chronic kidney disease, stage 3b: Secondary | ICD-10-CM

## 2023-04-09 DIAGNOSIS — I10 Essential (primary) hypertension: Secondary | ICD-10-CM

## 2023-04-09 DIAGNOSIS — C241 Malignant neoplasm of ampulla of Vater: Secondary | ICD-10-CM | POA: Diagnosis not present

## 2023-04-09 DIAGNOSIS — I4821 Permanent atrial fibrillation: Secondary | ICD-10-CM

## 2023-04-09 DIAGNOSIS — E039 Hypothyroidism, unspecified: Secondary | ICD-10-CM | POA: Diagnosis not present

## 2023-04-09 DIAGNOSIS — Q2730 Arteriovenous malformation, site unspecified: Secondary | ICD-10-CM

## 2023-04-09 DIAGNOSIS — I5032 Chronic diastolic (congestive) heart failure: Secondary | ICD-10-CM

## 2023-04-10 NOTE — Progress Notes (Signed)
 Location:  [facility] Service:  ALF ([id number])  Provider:   Code Status: DNR Goals of Care:     01/23/2023    9:46 AM  Advanced Directives  Does Patient Have a Medical Advance Directive? Yes  Type of Estate agent of [place];Living will;Out of facility DNR (pink MOST or yellow form)  Does patient want to make changes to medical advance directive? No - Patient declined  Copy of Healthcare Power of Attorney in Chart? Yes - validated most recent copy scanned in chart (See row information)     Chief Complaint  Patient presents with   Care Management    HPI: Patient is a 89 y.o. male seen today for medical management of chronic diseases.   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 On  Bevacizumab Q 6 weeks per Oncology HGB Good level Off Iron   Recent Covid Lost some weight Still hasa some cough   Also H/o A Fib Not on DOAC due to GI bleed Not watchman candidate either HTN, Chronic Heart Failure on Farxiga CKD   Patient is doing well Had No issues today Walks with his walker Still awaiting his CPAP machine  Says he does not get enough sleep at night and takes Naps  Wt Readings from Last 3 Encounters:  04/12/23 188 lb (85.3 kg)  03/19/23 188 lb 8 oz (85.5 kg)  02/26/23 180 lb (81.6 kg)     Past Medical History:  Diagnosis Date   Anal fistula    Arthritis    Fingers and hands   Atrial fibrillation (HCC)    AVM (arteriovenous malformation)    Clipped during Colonoscopy 05/2016   Barrett's esophagus    Bilateral cataracts    BPH (benign prostatic hyperplasia)    Cecal angiodysplasia 05/28/2016   ablated at colonoscopy   Deviated nasal septum    Diverticulosis of sigmoid colon    E. coli infection    Fatty liver    GERD  (gastroesophageal reflux disease)    History of colon polyps    Hypothyroidism    Iron deficiency anemia    Nodular basal cell carcinoma (BCC) 11/05/2017   Left Forehead (treatment after biopsy)   OSA on CPAP    Perianal rash    Recurrent epistaxis    Renal cyst 01/04/2013   Small left peripelvic renal cysts , noted on US Renal   SCCA (squamous cell carcinoma) of skin 10/17/2015   Left Sup Bridge of Nose (curet, cautery and 5FU)   Seasonal allergies    Superficial basal cell carcinoma (BCC) 10/17/2015   Left Bulb of Nose (curet, cautery and 5FU)   Tubular adenoma     Past Surgical History:  Procedure Laterality Date   BILIARY STENT PLACEMENT N/A 01/23/2022   Procedure: BILIARY STENT PLACEMENT;  Surgeon: Lemar Lofty., MD;  Location: [facility];  Service: Gastroenterology;  Laterality: N/A;   BILIARY STENT PLACEMENT N/A 08/06/2022   Procedure: BILIARY STENT PLACEMENT;  Surgeon: Lynann Bologna, MD;  Location: [facility];  Service: Gastroenterology;  Laterality: N/A;   BIOPSY  01/23/2022   Procedure: BIOPSY;  Surgeon: Meridee Score Netty Starring., MD;  Location: [facility];  Service: Gastroenterology;;   BIOPSY  07/09/2022   Procedure: BIOPSY;  Surgeon: Barron Alvine,  Verlin Dike, DO;  Location: [facility];  Service: Gastroenterology;;   BIOPSY  08/02/2022   Procedure: BIOPSY;  Surgeon: Sherrilyn Rist, MD;  Location: [facility];  Service: Gastroenterology;;   COLONOSCOPY  multiple   CYSTOSCOPY     ENDOSCOPIC RETROGRADE CHOLANGIOPANCREATOGRAPHY (ERCP) WITH PROPOFOL N/A 01/23/2022   Procedure: ENDOSCOPIC RETROGRADE CHOLANGIOPANCREATOGRAPHY (ERCP) WITH PROPOFOL;  Surgeon: Lemar Lofty., MD;  Location: [facility];  Service: Gastroenterology;  Laterality: N/A;   ENTEROSCOPY N/A 08/02/2022   Procedure: ENTEROSCOPY;  Surgeon: Sherrilyn Rist, MD;  Location: [facility];  Service: Gastroenterology;  Laterality: N/A;   ERCP N/A 08/06/2022   Procedure: ENDOSCOPIC  RETROGRADE CHOLANGIOPANCREATOGRAPHY (ERCP);  Surgeon: Lynann Bologna, MD;  Location: [facility];  Service: Gastroenterology;  Laterality: N/A;   ESOPHAGOGASTRODUODENOSCOPY  multiple   ESOPHAGOGASTRODUODENOSCOPY N/A 01/23/2022   Procedure: ESOPHAGOGASTRODUODENOSCOPY (EGD);  Surgeon: Lemar Lofty., MD;  Location: [facility];  Service: Gastroenterology;  Laterality: N/A;   ESOPHAGOGASTRODUODENOSCOPY N/A 07/09/2022   Procedure: ESOPHAGOGASTRODUODENOSCOPY (EGD);  Surgeon: Shellia Cleverly, DO;  Location[facility];  Service: Gastroenterology;  Laterality: N/A;   ESOPHAGOGASTRODUODENOSCOPY N/A 08/21/2022   Procedure: ESOPHAGOGASTRODUODENOSCOPY (EGD);  Surgeon: Jenel Lucks, MD;  Location: [facility]COPY;  Service: Gastroenterology;  Laterality: N/A;   EUS N/A 01/23/2022   Procedure: UPPER ENDOSCOPIC ULTRASOUND (EUS) RADIAL;  Surgeon: Lemar Lofty., MD;  Location: [facility];  Service: Gastroenterology;  Laterality: N/A;   EVALUATION UNDER ANESTHESIA WITH FISTULECTOMY N/A 03/02/2018   Procedure: ANORECTAL EXAM UNDER ANESTHESIA WITH REPAIR OF SUPERFICIAL PERIRECTAL FISTULA AND HEMORRHOIDECTOMY;  Surgeon: [name], MD;  Location: [facility];  Service: General;  Laterality: N/A;   EXPLORATORY LAPAROTOMY     HEMOSTASIS CLIP PLACEMENT  07/09/2022   Procedure: HEMOSTASIS CLIP PLACEMENT;  Surgeon: Shellia Cleverly, DO;  Location: [facility];  Service: Gastroenterology;;   HOT HEMOSTASIS N/A 08/02/2022   Procedure: HOT HEMOSTASIS (ARGON PLASMA COAGULATION/BICAP);  Surgeon: Sherrilyn Rist, MD;  Location: [facility];  Service: Gastroenterology;  Laterality: N/A;   HOT HEMOSTASIS N/A 08/21/2022   Procedure: HOT HEMOSTASIS (ARGON PLASMA COAGULATION/BICAP);  Surgeon: Jenel Lucks, MD;  Location: [facility] ENDOSCOPY;  Service: Gastroenterology;  Laterality: N/A;   IR THORACENTESIS ASP PLEURAL SPACE W/IMG GUIDE  07/10/2022   REMOVAL OF STONES  01/23/2022   Procedure: REMOVAL OF  STONES;  Surgeon: Meridee Score Netty Starring., MD;  Location: [facility];  Service: Gastroenterology;;   REMOVAL OF STONES  08/06/2022   Procedure: REMOVAL OF STONES;  Surgeon: Lynann Bologna, MD;  Location: [facility];  Service: Gastroenterology;;   Dennison Mascot  01/23/2022   Procedure: Dennison Mascot;  Surgeon: Lemar Lofty., MD;  Location: [facility];  Service: Gastroenterology;;    Allergies  Allergen Reactions   Nsaids Other (See Comments)    Patient is to not take these because of kidney issues    Outpatient Encounter Medications as of 04/09/2023  Medication Sig   acetaminophen (TYLENOL) 325 MG tablet Take 650 mg by mouth every 4 (four) hours as needed for fever or moderate pain (pain score 4-6).   cetirizine (ZYRTEC) 10 MG tablet Take 10 mg by mouth daily as needed for allergies.   Cholecalciferol (VITAMIN D3) 5000 units CAPS Take 5,000 Units by mouth every morning.   dapagliflozin propanediol (FARXIGA) 5 MG TABS tablet Take 1 tablet (5 mg total) by mouth in the morning.   Glucosamine 500 MG CAPS Take 1 capsule by mouth every morning.   levothyroxine (SYNTHROID) 137 MCG tablet Take 1 tablet (  137 mcg total) by mouth daily before breakfast.   Nutritional Supplements (BOOST PLUS PO) Take 1 Can by mouth once. 1 can orally one time a day for weight loss   nystatin cream (MYCOSTATIN) Apply 1 Application topically every 8 (eight) hours as needed (Yeast).   olopatadine (PATANOL) 0.1 % ophthalmic solution Place 1 drop into both eyes daily as needed for allergies.   ondansetron (ZOFRAN) 8 MG tablet Take 1 tablet (8 mg total) by mouth every 8 (eight) hours as needed for nausea or vomiting.   pantoprazole (PROTONIX) 40 MG tablet Take 1 tablet (40 mg total) by mouth 2 (two) times daily.   sucralfate (CARAFATE) 1 g tablet Take 1 g by mouth 4 (four) times daily -  with meals and at bedtime.   Zinc Oxide 10 % OINT Apply 1 Application topically as needed.   [DISCONTINUED] bumetanide  (BUMEX) 1 MG tablet Take 1 mg by mouth in the morning, at noon, in the evening, and at bedtime.   [DISCONTINUED] potassium chloride SA (KLOR-CON M) 20 MEQ tablet Take 20 mEq by mouth 2 (two) times daily.   No facility-administered encounter medications on file as of 04/09/2023.    Review of Systems:  Review of Systems  Constitutional:  Negative for activity change, appetite change and unexpected weight change.  HENT: Negative.    Respiratory:  Negative for cough and shortness of breath.   Cardiovascular:  Negative for leg swelling.  Gastrointestinal:  Negative for constipation.  Genitourinary:  Negative for frequency.  Musculoskeletal:  Positive for gait problem. Negative for arthralgias and myalgias.  Skin: Negative.  Negative for rash.  Neurological:  Negative for dizziness and weakness.  Psychiatric/Behavioral:  Positive for sleep disturbance. Negative for confusion.   All other systems reviewed and are negative.   Health Maintenance  Topic Date Due   Pneumonia Vaccine 25+ Years old (1 of 2 - PCV) Never done   DTaP/Tdap/Td (1 - Tdap) Never done   COVID-19 Vaccine (5 - 2024-25 season) 01/28/2023   INFLUENZA VACCINE  Completed   Zoster Vaccines- Shingrix  Completed   HPV VACCINES  Aged Out    Physical Exam: Vitals:   04/12/23 1621  BP: 122/71  Pulse: 76  Resp: (!) 22  Temp: 97.9 F (36.6 C)  Weight: 188 lb (85.3 kg)   Body mass index is 31.28 kg/m. Physical Exam Vitals reviewed.  Constitutional:      Appearance: Normal appearance.  HENT:     Head: Normocephalic.     Nose: Nose normal.     Mouth/Throat:     Mouth: Mucous membranes are moist.     Pharynx: Oropharynx is clear.  Eyes:     Pupils: Pupils are equal, round, and reactive to light.  Cardiovascular:     Rate and Rhythm: Normal rate. Rhythm irregular.     Pulses: Normal pulses.     Heart sounds: No murmur heard. Pulmonary:     Effort: Pulmonary effort is normal. No respiratory distress.     Breath  sounds: Normal breath sounds. No rales.  Abdominal:     General: Abdomen is flat. Bowel sounds are normal.     Palpations: Abdomen is soft.  Musculoskeletal:        General: No swelling.     Cervical back: Neck supple.  Skin:    General: Skin is warm.  Neurological:     General: No focal deficit present.     Mental Status: He is alert and oriented to person,  place, and time.  Psychiatric:        Mood and Affect: Mood normal.        Thought Content: Thought content normal.     Labs reviewed: Basic Metabolic Panel: Recent Labs    07/08/22 0237 07/09/22 0126 08/03/22 0252 08/04/22 0301 08/05/22 1610 08/06/22 0605 08/21/22 0247 08/22/22 0116 12/08/22 0000 12/18/22 1222 01/16/23 1240 01/19/23 0000 02/12/23 1313  NA 137   < > 144 142 138   < >  --    < >  --  139 141  --  139  K 4.1   < > 3.4* 4.5 4.1   < >  --    < >  --  4.1 4.1  --  4.0  CL 104   < > 114* 117* 109   < >  --    < >  --  106 106  --  105  CO2 22   < > 19* 20* 21*   < >  --    < >  --  26 27  --  26  GLUCOSE 104*   < > 124* 103* 97   < >  --    < >  --  115* 84  --  94  BUN 29*   < > 32* 31* 33*   < >  --    < >  --  28* 31*  --  36*  CREATININE 1.58*   < > 1.58* 1.69* 1.68*   < >  --    < >  --  1.34* 1.51*  --  1.57*  CALCIUM 8.6*   < > 8.2* 7.9* 8.2*   < >  --    < >  --  9.5 9.7  --  10.1  MG 1.7  --  1.4* 2.1 2.0  --  1.7  --   --   --   --   --   --   PHOS 2.1*  --  3.2  --   --   --   --   --   --   --   --   --   --   TSH  --   --   --   --   --   --   --   --  0.05*  --   --  0.14*  --    < > = values in this interval not displayed.   Liver Function Tests: Recent Labs    12/18/22 1222 01/16/23 1240 02/12/23 1313  AST 51* 35 41  ALT 53* 31 37  ALKPHOS 128* 105 117  BILITOT 0.5 0.5 0.4  PROT 6.5 6.8 7.4  ALBUMIN 3.2* 3.6 3.6   Recent Labs    08/15/22 1925  LIPASE 27   No results for input(s): "AMMONIA" in the last 8760 hours. CBC: Recent Labs    01/16/23 1240 02/12/23 1313  03/19/23 1223  WBC 4.4 5.8 5.0  NEUTROABS 2.5 3.9 3.0  HGB 10.4* 10.9* 11.6*  HCT 30.3* 33.3* 35.2*  MCV 102.4* 102.5* 103.2*  PLT 171 201 152   Lipid Panel: No results for input(s): "CHOL", "HDL", "LDLCALC", "TRIG", "CHOLHDL", "LDLDIRECT" in the last 8760 hours. Lab Results  Component Value Date   HGBA1C 4.4 (L) 08/02/2022    Procedures since last visit: No results found.  Assessment/Plan 1. Hypothyroidism, unspecified type (Primary) TSH reduced  for Low TSH Repeat TSH is Normal Will repeat Again in  8 weeks 2. Cancer of ampulla of Vater [facility]) On Vigilance per Oncology  3. AVM (arteriovenous malformation) On Bevacizumab   4. OSA (obstructive sleep apnea) Still Does not have His CPAP machine Awaiting Pulmonary  5. Permanent atrial fibrillation (HCC) No DOAC due to GI bleed  6. Essential hypertension Stable  7. Stage 3b chronic kidney disease (HCC) Creat stable  8. Chronic heart failure with preserved ejection fraction (HFpEF) (HCC) On Farxiga Bumex Discontinued Will repeat BMP in 1 week    Labs/tests ordered:  * No order type specified * Next appt:  Visit date not found

## 2023-04-12 ENCOUNTER — Encounter: Payer: Self-pay | Admitting: Internal Medicine

## 2023-04-13 ENCOUNTER — Telehealth: Payer: Self-pay | Admitting: Student

## 2023-04-13 NOTE — Telephone Encounter (Signed)
 I have sent another message to [employer] about this issue

## 2023-04-13 NOTE — Telephone Encounter (Signed)
 I received a message from Faith with [employer] [facility]   Per [employer] delivery record tracking number [id number] this items was shippped on 04-01-23 @ 852am and was delviered to the front desk of the facility on 04-05-23 @ 617am.

## 2023-04-13 NOTE — Telephone Encounter (Signed)
 PT's care home calling asking about CPAP. Best number to call to arrange fitting would be the home itself @ [phone] ask for a nurse.

## 2023-04-15 ENCOUNTER — Other Ambulatory Visit: Payer: Self-pay

## 2023-04-20 DIAGNOSIS — G4733 Obstructive sleep apnea (adult) (pediatric): Secondary | ICD-10-CM | POA: Diagnosis not present

## 2023-04-22 ENCOUNTER — Other Ambulatory Visit: Payer: Self-pay

## 2023-04-23 ENCOUNTER — Ambulatory Visit: Payer: PPO | Admitting: Nurse Practitioner

## 2023-04-23 NOTE — Telephone Encounter (Signed)
 NFN

## 2023-04-30 ENCOUNTER — Encounter: Payer: Self-pay | Admitting: Hematology

## 2023-04-30 ENCOUNTER — Inpatient Hospital Stay: Payer: PPO | Attending: Physician Assistant

## 2023-04-30 ENCOUNTER — Inpatient Hospital Stay (HOSPITAL_BASED_OUTPATIENT_CLINIC_OR_DEPARTMENT_OTHER): Payer: PPO | Admitting: Hematology

## 2023-04-30 ENCOUNTER — Inpatient Hospital Stay: Payer: PPO

## 2023-04-30 VITALS — BP 126/56 | HR 62 | Temp 97.6°F | Resp 18 | Ht 65.0 in | Wt 206.4 lb

## 2023-04-30 DIAGNOSIS — D519 Vitamin B12 deficiency anemia, unspecified: Secondary | ICD-10-CM

## 2023-04-30 DIAGNOSIS — C241 Malignant neoplasm of ampulla of Vater: Secondary | ICD-10-CM

## 2023-04-30 DIAGNOSIS — Z79899 Other long term (current) drug therapy: Secondary | ICD-10-CM | POA: Diagnosis not present

## 2023-04-30 DIAGNOSIS — D649 Anemia, unspecified: Secondary | ICD-10-CM

## 2023-04-30 DIAGNOSIS — K5521 Angiodysplasia of colon with hemorrhage: Secondary | ICD-10-CM

## 2023-04-30 DIAGNOSIS — Z5112 Encounter for antineoplastic immunotherapy: Secondary | ICD-10-CM | POA: Insufficient documentation

## 2023-04-30 DIAGNOSIS — D500 Iron deficiency anemia secondary to blood loss (chronic): Secondary | ICD-10-CM | POA: Diagnosis not present

## 2023-04-30 LAB — FERRITIN: Ferritin: 52 ng/mL (ref 24–336)

## 2023-04-30 LAB — CBC WITH DIFFERENTIAL (CANCER CENTER ONLY)
Abs Immature Granulocytes: 0.01 10*3/uL (ref 0.00–0.07)
Basophils Absolute: 0 10*3/uL (ref 0.0–0.1)
Basophils Relative: 1 %
Eosinophils Absolute: 0.2 10*3/uL (ref 0.0–0.5)
Eosinophils Relative: 3 %
HCT: 30.9 % — ABNORMAL LOW (ref 39.0–52.0)
Hemoglobin: 10.2 g/dL — ABNORMAL LOW (ref 13.0–17.0)
Immature Granulocytes: 0 %
Lymphocytes Relative: 18 %
Lymphs Abs: 1 10*3/uL (ref 0.7–4.0)
MCH: 33.9 pg (ref 26.0–34.0)
MCHC: 33 g/dL (ref 30.0–36.0)
MCV: 102.7 fL — ABNORMAL HIGH (ref 80.0–100.0)
Monocytes Absolute: 0.7 10*3/uL (ref 0.1–1.0)
Monocytes Relative: 14 %
Neutro Abs: 3.6 10*3/uL (ref 1.7–7.7)
Neutrophils Relative %: 64 %
Platelet Count: 153 10*3/uL (ref 150–400)
RBC: 3.01 MIL/uL — ABNORMAL LOW (ref 4.22–5.81)
RDW: 19.3 % — ABNORMAL HIGH (ref 11.5–15.5)
WBC Count: 5.5 10*3/uL (ref 4.0–10.5)
nRBC: 0 % (ref 0.0–0.2)

## 2023-04-30 LAB — SAMPLE TO BLOOD BANK

## 2023-04-30 LAB — TOTAL PROTEIN, URINE DIPSTICK: Protein, ur: NEGATIVE mg/dL

## 2023-04-30 LAB — VITAMIN B12: Vitamin B-12: 570 pg/mL (ref 180–914)

## 2023-04-30 MED ORDER — SODIUM CHLORIDE 0.9% FLUSH: 10.0000 mL | INTRAVENOUS | Status: DC | PRN

## 2023-04-30 MED ORDER — CYANOCOBALAMIN 1000 MCG/ML IJ SOLN
1000.0000 ug | Freq: Once | INTRAMUSCULAR | Status: AC
  Administered 2023-04-30: 1000 ug via INTRAMUSCULAR
  Filled 2023-04-30: qty 1

## 2023-04-30 MED ORDER — SODIUM CHLORIDE 0.9 % IV SOLN: Freq: Once | INTRAVENOUS | Status: AC

## 2023-04-30 MED ORDER — BEVACIZUMAB-ADCD CHEMO INJECTION 400 MG/16ML
5.0000 mg/kg | Freq: Once | INTRAVENOUS | Status: AC
  Administered 2023-04-30: 400 mg via INTRAVENOUS
  Filled 2023-04-30: qty 16

## 2023-04-30 MED ORDER — HEPARIN SOD (PORK) LOCK FLUSH 100 UNIT/ML IV SOLN: 500.0000 [IU] | Freq: Once | INTRAVENOUS | Status: DC | PRN

## 2023-04-30 NOTE — Assessment & Plan Note (Signed)
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  -He has required multiple blood transfusions -We discussed the benefit and side effect of bevacizumab for AVM related GI bleeding, he started on 09/11/22, he received every 2 weeks for total of 4 cycles. -due to good response to beva and persistent anemia, will continue beva every 4 weeks as maintenance therapy

## 2023-04-30 NOTE — Progress Notes (Signed)
 Ok to proceed with Vegzelma 400mg  despite wt gain per Dr. Mosetta Putt.  Drusilla Kanner, PharmD, MBA

## 2023-04-30 NOTE — Assessment & Plan Note (Signed)
-  cTxN0M0, diagnosed in 12/2021, MMR normal  -I reviewed PET scan findings, which showed no evidence of distant metastasis  --I reviewed his molecular testing, MMR was normal, he is not a candidate for immunotherapy.  Foundation One showed no targetable mutations. -He completed RT on 04/09/2022 -Due to advanced age, I do not plan to offer him chemotherapy -CT scan in June 2024 showed no evidence of residual disease or new metastasis, although CT is not ideal to evaluate the residual disease in his case.

## 2023-04-30 NOTE — Progress Notes (Signed)
 [facility]   Telephone:(336) [phone] Fax:(336) [phone]   Clinic Follow up Note   Patient Care Team: [name], MD as PCP - General (Internal Medicine) [name], MD as PCP - Cardiology (Cardiology) [name], MD as Consulting Physician (General Surgery) [name], MD as Consulting Physician (Gastroenterology) [name], MD as Consulting Physician (Otolaryngology) [name], MD as Consulting Physician (Cardiology) Suzi Roots as Physician Assistant (Dermatology) Malachy Mood, MD as Consulting Physician (Oncology)  Date of Service:  04/30/2023  CHIEF COMPLAINT: f/u of GI bleeding and anemia  CURRENT THERAPY:  Bevacizumab every 6 weeks  Oncology History   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  Foundation One showed no targetable mutations. -He completed RT on 04/09/2022 -Due to advanced age, I do not plan to offer him chemotherapy -CT scan in June 2024 showed no evidence of residual disease or new metastasis, although CT is not ideal to evaluate the residual disease in his case.        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  -He has required multiple blood transfusions -We discussed the benefit and side effect of bevacizumab for AVM related GI bleeding, he started on 09/11/22, he received every 2 weeks for total of 4 cycles. -due to good response to beva and persistent anemia, will continue beva every 4 weeks as maintenance therapy     Assessment and Plan    Ampullary carcinoma He is asymptomatic with no obstruction or dysphagia. No imaging or  endoscopy is planned as long as he maintains adequate oral intake and remains symptom-free. - Monitor symptoms and eating habits - Avoid scans and endoscopy unless symptoms worsen  Anemia Hemoglobin is 10.2, a decrease from previous levels of 11.6, 10.9, and 10.4. He has no overt bleeding and maintains adequate energy levels. Anemia is monitored every six weeks, with potential adjustment to five weeks if hemoglobin declines further. - Monitor hemoglobin levels every six weeks - Adjust follow-up interval to five weeks if hemoglobin continues to decrease  Peripheral edema Reports of lower extremity edema, particularly around the ankles. He elevates his feet when seated, which may alleviate swelling. No new medications are contributing. - Elevate feet when sitting  Indigestion Experiences occasional indigestion, primarily in the morning, resolving spontaneously without medication. - Monitor symptoms and consider medication if indigestion becomes bothersome     Plan -Lab reviewed mild anemia is overall stable, will proceed bevacizumab today and continue every 6 weeks -Follow-up in 6 weeks     SUMMARY OF ONCOLOGIC HISTORY: Oncology History  Cancer of ampulla of Vater (HCC)  12/06/2021 Imaging   CT ABDOMEN PELVIS W CONTRAST   IMPRESSION: 1. There is moderate intrahepatic bile duct dilatation with marked fusiform dilatation of the common bile duct. No CT visible common bile duct stones identified. Differential considerations include a obstructing distal common bile duct stone (not visible by CT), distal CBD stricture, or mass at the level of the ampullary. Consider further evaluation with contrast enhanced MRI/MRCP or ERCP. 2. Contour the liver appears nodular which may reflect underlying cirrhosis. 3. Age-indeterminate compression fracture involving L1 vertebral body with  loss of 50% of the vertebral body height. No signs of retropulsion of fracture fragments into the canal. 4.  Fat containing umbilical hernia. 5. 5 mm anterolisthesis of L4 on L5. 6.  Aortic Atherosclerosis (ICD10-I70.0).   12/16/2021 Imaging   MR ABDOMEN MRCP W WO CONTAST   IMPRESSION: 1. Moderate intrahepatic and extrahepatic bile duct dilation with smooth tapering to the level of the ampulla without filling defect or focal mass lesion identified. Findings may reflect ampullary stricture or occult ampullary mass. Consider further evaluation with ERCP. 2. Mild dilated main pancreatic duct at the head measuring up to 8 mm. 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 Recommend follow-up pre and post-contrast MRI/MRCP in 2 years. This recommendation follows ACR consensus guidelines: Management of Incidental Pancreatic Cysts: A White Paper of the ACR Incidental Findings Committee. J Am Coll Radiol 2017;14:911-923. 3.  Aortic Atherosclerosis (ICD10-I70.0).   01/23/2022 Procedure   DG ERCP   IMPRESSION: Dilatation of the extrahepatic bile duct with severe tapering or narrowing in the distal common bile duct. Placement of biliary stent.   These images were submitted for radiologic interpretation only. Please see the procedural report for the amount of contrast.    01/23/2022 Procedure   EUS/ERCP  ENDOSONOGRAPHIC FINDING: There was dilation in the common bile duct (12.3 mm -> 20.4 mm) and in the common hepatic duct (23.0 mm). A small amount of hyperechoic material consistent with sludge was visualized endosonographically in the common bile duct. Moderate hyperechoic material consistent with sludge was visualized endosonographically in the gallbladder with normal gallbladder wall thickness. Pancreatic parenchymal abnormalities were noted in the entire pancreas. These consisted of hyperechoic foci with shadowing and cysts. There was a 9.1 mm by 6.1 mm cyst in the neck of the pancreas. There was a  15.3 mm by 14.1 mm cyst in the tail of the pancreas. The pancreatic duct had a dilated endosonographic appearance, had a prominently branched endosonographic appearance and had hyperechoic walls in the pancreatic head (PD - 8.2 mm -> 4.3 mm), genu of the pancreas (3.5 mm), body of the pancreas (3.2 mm) and tail of the pancreas (2.8 mm). A hypoechoic irregular lesion was identified endosonographically at the ampulla. The lesion measured 15 mm by 13 mm in maximal cross-sectional diameter. The lesion extended from the mucosa to the submucosa. The outer margins were irregular. This is noted where CBD and PD dilation is noted to occur. Endosonographic imaging in the visualized portion of the liver showed no mass.  No malignant-appearing lymph nodes were visualized in the celiac region (level 20), peripancreatic region and porta hepatis region. The celiac region was visualized.   01/23/2022 Pathology Results   SURGICAL PATHOLOGY  CASE: [id number]  PATIENT: Carl Woods  Surgical Pathology Report   FINAL MICROSCOPIC DIAGNOSIS:   A. STOMACH, RANDOM, BIOPSY:  - Gastric mucosa, no significant abnormality.  No inflammation,  intestinal metaplasia, dysplasia or malignancy.   B. AMPULLARY LESION, BIOPSY:  - Adenocarcinoma.  See comment.    01/31/2022 Initial Diagnosis   Cancer of ampulla of Vater (HCC)   02/07/2022 Imaging    IMPRESSION: Interval increase in size of lymph node in the anterior cardiophrenic space on the right.   Increase small right pleural effusion compared to the previous MRI.   Subtle nodular tissue along the margin of the right hepatic lobe in the upper abdomen posteriorly. Recommend additional workup when appropriate.   Calcified pleural plaques.  Evidence of chronic liver disease.   Aortic Atherosclerosis (ICD10-I70.0).   02/14/2022 Miscellaneous   Foundation One:  Biomarker Findings Microsatellite status-Cannot be determined Tumor Mutation Burden-Cannot  be determined  Genomics Findings EPHB4 amplificatio SF3B1 [id number] [id number]       Discussed the use of AI scribe software for clinical note transcription with the patient, who gave verbal consent to proceed.  History of Present Illness   The patient, an 89 year old with a history of amyloid carcinoma and anemia, reports no significant changes in his health status since the last visit. He denies any bleeding and maintains regular bowel movements. His energy levels are described as "pretty good," although he suggests he could be slightly lower. He has been active, going to different places and walking to the dining hall. He reports some indigestion, which is more noticeable in the mornings but does not feel it is severe enough to require medication. He also notes some swelling in his legs, particularly noticeable in the ankles.         All other systems were reviewed with the patient and are negative.  MEDICAL HISTORY:  Past Medical History:  Diagnosis Date   Anal fistula    Arthritis    Fingers and hands   Atrial fibrillation (HCC)    AVM (arteriovenous malformation)    Clipped during Colonoscopy 05/2016   Barrett's esophagus    Bilateral cataracts    BPH (benign prostatic hyperplasia)    Cecal angiodysplasia 05/28/2016   ablated at colonoscopy   Deviated nasal septum    Diverticulosis of sigmoid colon    E. 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   Left Forehead (treatment after biopsy)   OSA on CPAP    Perianal rash    Recurrent epistaxis    Renal cyst 01/04/2013   Small left peripelvic renal cysts , noted on US Renal   SCCA (squamous cell carcinoma) of skin 10/17/2015   Left Sup Bridge of Nose (curet, cautery and 5FU)   Seasonal allergies    Superficial basal cell carcinoma (BCC) 10/17/2015   Left Bulb of Nose  (curet, cautery and 5FU)   Tubular adenoma     SURGICAL HISTORY: Past Surgical History:  Procedure Laterality Date   BILIARY STENT PLACEMENT N/A 01/23/2022   Procedure: BILIARY STENT PLACEMENT;  Surgeon: Lemar Lofty., MD;  Location: [facility];  Service: Gastroenterology;  Laterality: N/A;   BILIARY STENT PLACEMENT N/A 08/06/2022   Procedure: BILIARY STENT PLACEMENT;  Surgeon: Lynann Bologna, MD;  Location: [facility];  Service: Gastroenterology;  Laterality: N/A;   BIOPSY  01/23/2022   Procedure: BIOPSY;  Surgeon: Meridee Score Netty Starring., MD;  Location: [facility];  Service: Gastroenterology;;   BIOPSY  07/09/2022   Procedure: BIOPSY;  Surgeon: Shellia Cleverly, DO;  Location: [facility];  Service: Gastroenterology;;   BIOPSY  08/02/2022   Procedure: BIOPSY;  Surgeon: Sherrilyn Rist, MD;  Location: [facility];  Service: Gastroenterology;;   COLONOSCOPY  multiple   CYSTOSCOPY     ENDOSCOPIC RETROGRADE CHOLANGIOPANCREATOGRAPHY (ERCP) WITH PROPOFOL N/A 01/23/2022   Procedure: ENDOSCOPIC RETROGRADE CHOLANGIOPANCREATOGRAPHY (ERCP) WITH PROPOFOL;  Surgeon: Lemar Lofty., MD;  Location: [facility];  Service: Gastroenterology;  Laterality: N/A;   ENTEROSCOPY N/A 08/02/2022   Procedure: ENTEROSCOPY;  Surgeon: [name],  MD;  Location: [facility];  Service: Gastroenterology;  Laterality: N/A;   ERCP N/A 08/06/2022   Procedure: ENDOSCOPIC RETROGRADE CHOLANGIOPANCREATOGRAPHY (ERCP);  Surgeon: Lynann Bologna, MD;  Location: [facility];  Service: Gastroenterology;  Laterality: N/A;   ESOPHAGOGASTRODUODENOSCOPY  multiple   ESOPHAGOGASTRODUODENOSCOPY N/A 01/23/2022   Procedure: ESOPHAGOGASTRODUODENOSCOPY (EGD);  Surgeon: Lemar Lofty., MD;  Location: [facility];  Service: Gastroenterology;  Laterality: N/A;   ESOPHAGOGASTRODUODENOSCOPY N/A 07/09/2022   Procedure: ESOPHAGOGASTRODUODENOSCOPY (EGD);  Surgeon: Shellia Cleverly, DO;  Location: [facility]  ENDOSCOPY;  Service: Gastroenterology;  Laterality: N/A;   ESOPHAGOGASTRODUODENOSCOPY N/A 08/21/2022   Procedure: ESOPHAGOGASTRODUODENOSCOPY (EGD);  Surgeon: Jenel Lucks, MD;  Location[facility];  Service: Gastroenterology;  Laterality: N/A;   EUS N/A 01/23/2022   Procedure: UPPER ENDOSCOPIC ULTRASOUND (EUS) RADIAL;  Surgeon: Lemar Lofty., MD;  Location: [facility];  Service: Gastroenterology;  Laterality: N/A;   EVALUATION UNDER ANESTHESIA WITH FISTULECTOMY N/A 03/02/2018   Procedure: ANORECTAL EXAM UNDER ANESTHESIA WITH REPAIR OF SUPERFICIAL PERIRECTAL FISTULA AND HEMORRHOIDECTOMY;  Surgeon: [name], MD;  Location: [facility];  Service: General;  Laterality: N/A;   EXPLORATORY LAPAROTOMY     HEMOSTASIS CLIP PLACEMENT  07/09/2022   Procedure: HEMOSTASIS CLIP PLACEMENT;  Surgeon: Shellia Cleverly, DO;  Location: [facility];  Service: Gastroenterology;;   HOT HEMOSTASIS N/A 08/02/2022   Procedure: HOT HEMOSTASIS (ARGON PLASMA COAGULATION/BICAP);  Surgeon: Sherrilyn Rist, MD;  Location: [facility];  Service: Gastroenterology;  Laterality: N/A;   HOT HEMOSTASIS N/A 08/21/2022   Procedure: HOT HEMOSTASIS (ARGON PLASMA COAGULATION/BICAP);  Surgeon: Jenel Lucks, MD;  Location: [facility] ENDOSCOPY;  Service: Gastroenterology;  Laterality: N/A;   IR THORACENTESIS ASP PLEURAL SPACE W/IMG GUIDE  07/10/2022   REMOVAL OF STONES  01/23/2022   Procedure: REMOVAL OF STONES;  Surgeon: Meridee Score Netty Starring., MD;  Location: [facility];  Service: Gastroenterology;;   REMOVAL OF STONES  08/06/2022   Procedure: REMOVAL OF STONES;  Surgeon: Lynann Bologna, MD;  Location: [facility];  Service: Gastroenterology;;   Dennison Mascot  01/23/2022   Procedure: Dennison Mascot;  Surgeon: Mansouraty, Netty Starring., MD;  Location: [facility];  Service: Gastroenterology;;    I have reviewed the social history and family history with the patient and they are unchanged from previous  note.  ALLERGIES:  is allergic to nsaids.  MEDICATIONS:  Current Outpatient Medications  Medication Sig Dispense Refill   acetaminophen (TYLENOL) 325 MG tablet Take 650 mg by mouth every 4 (four) hours as needed for fever or moderate pain (pain score 4-6).     cetirizine (ZYRTEC) 10 MG tablet Take 10 mg by mouth daily as needed for allergies.     Cholecalciferol (VITAMIN D3) 5000 units CAPS Take 5,000 Units by mouth every morning.     dapagliflozin propanediol (FARXIGA) 5 MG TABS tablet Take 1 tablet (5 mg total) by mouth in the morning.     Glucosamine 500 MG CAPS Take 1 capsule by mouth every morning.     levothyroxine (SYNTHROID) 137 MCG tablet Take 1 tablet (137 mcg total) by mouth daily before breakfast.     Nutritional Supplements (BOOST PLUS PO) Take 1 Can by mouth once. 1 can orally one time a day for weight loss     nystatin cream (MYCOSTATIN) Apply 1 Application topically every 8 (eight) hours as needed (Yeast).     olopatadine (PATANOL) 0.1 % ophthalmic solution Place 1 drop into both eyes daily as needed for allergies.     ondansetron (ZOFRAN) 8 MG tablet  Take 1 tablet (8 mg total) by mouth every 8 (eight) hours as needed for nausea or vomiting. 20 tablet 0   pantoprazole (PROTONIX) 40 MG tablet Take 1 tablet (40 mg total) by mouth 2 (two) times daily.     sucralfate (CARAFATE) 1 g tablet Take 1 g by mouth 4 (four) times daily -  with meals and at bedtime.     Zinc Oxide 10 % OINT Apply 1 Application topically as needed.     No current facility-administered medications for this visit.   Facility-Administered Medications Ordered in Other Visits  Medication Dose Route Frequency Provider Last Rate Last Admin   heparin lock flush 100 unit/mL  500 Units Intracatheter Once PRN Malachy Mood, MD       sodium chloride flush (NS) 0.9 % injection 10 mL  10 mL Intracatheter PRN Malachy Mood, MD        PHYSICAL EXAMINATION: ECOG PERFORMANCE STATUS: 2 - Symptomatic, <50% confined to  bed  Vitals:   04/30/23 1226  BP: (!) 126/56  Pulse: 62  Resp: 18  Temp: 97.6 F (36.4 C)  SpO2: 100%   Wt Readings from Last 3 Encounters:  04/30/23 206 lb 6.4 oz (93.6 kg)  04/12/23 188 lb (85.3 kg)  03/19/23 188 lb 8 oz (85.5 kg)     GENERAL:alert, no distress and comfortable SKIN: skin color, texture, turgor are normal, no rashes or significant lesions EYES: normal, Conjunctiva are pink and non-injected, sclera clear NECK: supple, thyroid normal size, non-tender, without nodularity LYMPH:  no palpable lymphadenopathy in the cervical, axillary  LUNGS: clear to auscultation and percussion with normal breathing effort HEART: regular rate & rhythm and no murmurs and no lower extremity edema ABDOMEN:abdomen soft, non-tender and normal bowel sounds Musculoskeletal:no cyanosis of digits and no clubbing  NEURO: alert & oriented x 3 with fluent speech, no focal motor/sensory deficits  LABORATORY DATA:  I have reviewed the data as listed    Latest Ref Rng & Units 04/30/2023   11:38 AM 03/19/2023   12:23 PM 02/12/2023    1:13 PM  CBC  WBC 4.0 - 10.5 K/uL 5.5  5.0  5.8   Hemoglobin 13.0 - 17.0 g/dL 40.9  81.1  91.4   Hematocrit 39.0 - 52.0 % 30.9  35.2  33.3   Platelets 150 - 400 K/uL 153  152  201         Latest Ref Rng & Units 02/12/2023    1:13 PM 01/16/2023   12:40 PM 12/18/2022   12:22 PM  CMP  Glucose 70 - 99 mg/dL 94  84  782   BUN 8 - 23 mg/dL 36  31  28   Creatinine 0.61 - 1.24 mg/dL 9.56  2.13  0.86   Sodium 135 - 145 mmol/L 139  141  139   Potassium 3.5 - 5.1 mmol/L 4.0  4.1  4.1   Chloride 98 - 111 mmol/L 105  106  106   CO2 22 - 32 mmol/L 26  27  26    Calcium 8.9 - 10.3 mg/dL 57.8  9.7  9.5   Total Protein 6.5 - 8.1 g/dL 7.4  6.8  6.5   Total Bilirubin 0.0 - 1.2 mg/dL 0.4  0.5  0.5   Alkaline Phos 38 - 126 U/L 117  105  128   AST 15 - 41 U/L 41  35  51   ALT 0 - 44 U/L 37  31  53  RADIOGRAPHIC STUDIES: I have personally reviewed the radiological  images as listed and agreed with the findings in the report. No results found.    Orders Placed This Encounter  Procedures   CBC with Differential (Cancer Center Only)    Standing Status:   Future    Expected Date:   07/23/2023    Expiration Date:   07/22/2024   Total Protein, Urine dipstick    Standing Status:   Future    Expected Date:   07/23/2023    Expiration Date:   07/22/2024   All questions were answered. The patient knows to call the clinic with any problems, questions or concerns. No barriers to learning was detected. The total time spent in the appointment was 25 minutes.     Malachy Mood, MD 04/30/2023

## 2023-04-30 NOTE — Patient Instructions (Signed)
 [facility] - A DEPT OF [facility]  Discharge Instructions: Thank you for choosing [facility] to provide your oncology and hematology care.   If you have a lab appointment with the Cancer Center, please go directly to the Cancer Center and check in at the registration area.   Wear comfortable clothing and clothing appropriate for easy access to any Portacath or PICC line.   We strive to give you quality time with your provider. You may need to reschedule your appointment if you arrive late (15 or more minutes).  Arriving late affects you and other patients whose appointments are after yours.  Also, if you miss three or more appointments without notifying the office, you may be dismissed from the clinic at the provider's discretion.      For prescription refill requests, have your pharmacy contact our office and allow 72 hours for refills to be completed.    Today you received the following chemotherapy and/or immunotherapy agents: Vegzelma      To help prevent nausea and vomiting after your treatment, we encourage you to take your nausea medication as directed.  BELOW ARE SYMPTOMS THAT SHOULD BE REPORTED IMMEDIATELY: *FEVER GREATER THAN 100.4 F (38 C) OR HIGHER *CHILLS OR SWEATING *NAUSEA AND VOMITING THAT IS NOT CONTROLLED WITH YOUR NAUSEA MEDICATION *UNUSUAL SHORTNESS OF BREATH *UNUSUAL BRUISING OR BLEEDING *URINARY PROBLEMS (pain or burning when urinating, or frequent urination) *BOWEL PROBLEMS (unusual diarrhea, constipation, pain near the anus) TENDERNESS IN MOUTH AND THROAT WITH OR WITHOUT PRESENCE OF ULCERS (sore throat, sores in mouth, or a toothache) UNUSUAL RASH, SWELLING OR PAIN  UNUSUAL VAGINAL DISCHARGE OR ITCHING   Items with * indicate a potential emergency and should be followed up as soon as possible or go to the Emergency Department if any problems should occur.  Please show the CHEMOTHERAPY ALERT CARD or IMMUNOTHERAPY  ALERT CARD at check-in to the Emergency Department and triage nurse.  Should you have questions after your visit or need to cancel or reschedule your appointment, please contact [facility] - A DEPT OF [facility]  Dept: [phone]  and follow the prompts.  Office hours are 8:00 a.m. to 4:30 p.m. Monday - Friday. Please note that voicemails left after 4:00 p.m. may not be returned until the following business day.  We are closed weekends and major holidays. You have access to a nurse at all times for urgent questions. Please call the main number to the clinic Dept: [phone] and follow the prompts.   For any non-urgent questions, you may also contact your provider using MyChart. We now offer e-Visits for anyone 30 and older to request care online for non-urgent symptoms. For details visit [email or web address].   Also download the MyChart app! Go to the app store, search "MyChart", open the app, select [facility], and log in with your MyChart username and password.

## 2023-05-12 ENCOUNTER — Encounter: Payer: Self-pay | Admitting: Adult Health

## 2023-05-12 ENCOUNTER — Non-Acute Institutional Stay: Payer: Self-pay | Admitting: Adult Health

## 2023-05-12 DIAGNOSIS — C241 Malignant neoplasm of ampulla of Vater: Secondary | ICD-10-CM

## 2023-05-12 DIAGNOSIS — R600 Localized edema: Secondary | ICD-10-CM | POA: Diagnosis not present

## 2023-05-12 DIAGNOSIS — I5032 Chronic diastolic (congestive) heart failure: Secondary | ICD-10-CM

## 2023-05-12 DIAGNOSIS — N1832 Chronic kidney disease, stage 3b: Secondary | ICD-10-CM

## 2023-05-12 MED ORDER — FUROSEMIDE 20 MG PO TABS: 20.0000 mg | ORAL_TABLET | ORAL | 3 refills | Status: DC

## 2023-05-12 NOTE — Progress Notes (Signed)
 Location:  [facility] Room Number: 31 A Place of Service:  ALF [phone]) Provider:  Kenard Gower, DNP, FNP-BC  Patient Care Team: [name], MD as PCP - General (Internal Medicine) [name], MD as PCP - Cardiology (Cardiology) [name], MD as Consulting Physician (General Surgery) [name], MD as Consulting Physician (Gastroenterology) [name], MD as Consulting Physician (Otolaryngology) [name], MD as Consulting Physician (Cardiology) Suzi Roots as Physician Assistant (Dermatology) Malachy Mood, MD as Consulting Physician (Oncology)  Extended Emergency Contact Information Primary Emergency Contact: Savas,[name] Address: **call 2x if no answer 1st time**          [address]          [place], [place] [address] [place] Phone: [phone] Relation: Daughter Secondary Emergency Contact: Zarazua(POA),[name], [place] [address] [place] of [place] Home Phone: [phone] Mobile Phone: [phone] Relation: Son  Code Status:   DNR  Goals of care: Advanced Directive information    01/23/2023    9:46 AM  Advanced Directives  Does Patient Have a Medical Advance Directive? Yes  Type of Estate agent of [place];Living will;Out of facility DNR (pink MOST or yellow form)  Does patient want to make changes to medical advance directive? No - Patient declined  Copy of Healthcare Power of Attorney in Chart? Yes - validated most recent copy scanned in chart (See row information)     Chief Complaint  Patient presents with   Acute Visit    Lower extremity swelling    HPI:  Pt is a 89 y.o. male seen today for an acute visit regarding lower extremity swelling.  He is a resident of [facility]. He was noted to have 2-3+ lower extremity edema. He denies pain nor shortness of breath. He has CHF and currently on dapagliflozin 5 mg daily.  He continues to take  bumetanide but was discontinued due to thirst. Latest GF 33, ranging as chronic kidney disease stage IIIb.  He has cancer of ampulla of vater and currently having infusion every 6 weeks at the cancer center.    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   Left Forehead (treatment after biopsy)   OSA on CPAP    Perianal rash    Recurrent epistaxis    Renal cyst 01/04/2013   Small left peripelvic renal cysts , noted on US Renal   SCCA (squamous cell carcinoma) of skin 10/17/2015   Left Sup Bridge of Nose (curet, cautery and 5FU)   Seasonal allergies    Superficial basal cell carcinoma (BCC) 10/17/2015   Left Bulb of Nose (curet, cautery and 5FU)   Tubular adenoma    Past Surgical History:  Procedure Laterality Date  BILIARY STENT PLACEMENT N/A 01/23/2022   Procedure: BILIARY STENT PLACEMENT;  Surgeon: Brice Campi Albino Alu., MD;  Location: [facility];  Service: Gastroenterology;  Laterality: N/A;   BILIARY STENT PLACEMENT N/A 08/06/2022   Procedure: BILIARY STENT PLACEMENT;  Surgeon: Lajuan Pila, MD;  Location: [facility];  Service: Gastroenterology;  Laterality: N/A;   BIOPSY  01/23/2022   Procedure: BIOPSY;  Surgeon: Brice Campi Albino Alu., MD;  Location: [facility];  Service: Gastroenterology;;   BIOPSY  07/09/2022   Procedure: BIOPSY;  Surgeon: Annis Kinder, DO;  Location: [facility];  Service: Gastroenterology;;    BIOPSY  08/02/2022   Procedure: BIOPSY;  Surgeon: Albertina Hugger, MD;  Location: [facility];  Service: Gastroenterology;;   COLONOSCOPY  multiple   CYSTOSCOPY     ENDOSCOPIC RETROGRADE CHOLANGIOPANCREATOGRAPHY (ERCP) WITH PROPOFOL N/A 01/23/2022   Procedure: ENDOSCOPIC RETROGRADE CHOLANGIOPANCREATOGRAPHY (ERCP) WITH PROPOFOL;  Surgeon: Normie Becton., MD;  Location: [facility];  Service: Gastroenterology;  Laterality: N/A;   ENTEROSCOPY N/A 08/02/2022   Procedure: ENTEROSCOPY;  Surgeon: Albertina Hugger, MD;  Location: [facility];  Service: Gastroenterology;  Laterality: N/A;   ERCP N/A 08/06/2022   Procedure: ENDOSCOPIC RETROGRADE CHOLANGIOPANCREATOGRAPHY (ERCP);  Surgeon: Lajuan Pila, MD;  Location: [facility];  Service: Gastroenterology;  Laterality: N/A;   ESOPHAGOGASTRODUODENOSCOPY  multiple   ESOPHAGOGASTRODUODENOSCOPY N/A 01/23/2022   Procedure: ESOPHAGOGASTRODUODENOSCOPY (EGD);  Surgeon: Normie Becton., MD;  Location: [facility];  Service: Gastroenterology;  Laterality: N/A;   ESOPHAGOGASTRODUODENOSCOPY N/A 07/09/2022   Procedure: ESOPHAGOGASTRODUODENOSCOPY (EGD);  Surgeon: Annis Kinder, DO;  Location: [facility];  Service: Gastroenterology;  Laterality: N/A;   ESOPHAGOGASTRODUODENOSCOPY N/A 08/21/2022   Procedure: ESOPHAGOGASTRODUODENOSCOPY (EGD);  Surgeon: Elois Hair, MD;  Location: [facility];  Service: Gastroenterology;  Laterality: N/A;   EUS N/A 01/23/2022   Procedure: UPPER ENDOSCOPIC ULTRASOUND (EUS) RADIAL;  Surgeon: Normie Becton., MD;  Location: [facility];  Service: Gastroenterology;  Laterality: N/A;   EVALUATION UNDER ANESTHESIA WITH FISTULECTOMY N/A 03/02/2018   Procedure: ANORECTAL EXAM UNDER ANESTHESIA WITH REPAIR OF SUPERFICIAL PERIRECTAL FISTULA AND HEMORRHOIDECTOMY;  Surgeon: Candyce Champagne, MD;  Location: [facility];  Service: General;  Laterality: N/A;   EXPLORATORY LAPAROTOMY     HEMOSTASIS CLIP PLACEMENT   07/09/2022   Procedure: HEMOSTASIS CLIP PLACEMENT;  Surgeon: Annis Kinder, DO;  Location: [facility];  Service: Gastroenterology;;   HOT HEMOSTASIS N/A 08/02/2022   Procedure: HOT HEMOSTASIS (ARGON PLASMA COAGULATION/BICAP);  Surgeon: Albertina Hugger, MD;  Location: [facility];  Service: Gastroenterology;  Laterality: N/A;   HOT HEMOSTASIS N/A 08/21/2022   Procedure: HOT HEMOSTASIS (ARGON PLASMA COAGULATION/BICAP);  Surgeon: Elois Hair, MD;  Location: [facility]COPY;  Service: Gastroenterology;  Laterality: N/A;   IR THORACENTESIS ASP PLEURAL SPACE W/IMG GUIDE  07/10/2022   REMOVAL OF STONES  01/23/2022   Procedure: REMOVAL OF STONES;  Surgeon: Brice Campi Albino Alu., MD;  Location: [facility];  Service: Gastroenterology;;   REMOVAL OF STONES  08/06/2022   Procedure: REMOVAL OF STONES;  Surgeon: Lajuan Pila, MD;  Location: [facility];  Service: Gastroenterology;;   [address]  01/23/2022   Procedure: [address];  Surgeon: Mansouraty, Albino Alu., MD;  Location: [facility];  Service: Gastroenterology;;    Allergies  Allergen Reactions   Nsaids Other (See Comments)    Patient is to not take these because of kidney issues    Outpatient Encounter Medications as of 05/12/2023  Medication Sig   [START ON 05/13/2023] furosemide (LASIX) 20 MG tablet Take 1  tablet (20 mg total) by mouth every Monday, Wednesday, and Friday.   acetaminophen (TYLENOL) 325 MG tablet Take 650 mg by mouth every 4 (four) hours as needed for fever or moderate pain (pain score 4-6).   cetirizine (ZYRTEC) 10 MG tablet Take 10 mg by mouth daily as needed for allergies.   Cholecalciferol (VITAMIN D3) 5000 units CAPS Take 5,000 Units by mouth every morning.   dapagliflozin propanediol (FARXIGA) 5 MG TABS tablet Take 1 tablet (5 mg total) by mouth in the morning.   Glucosamine 500 MG CAPS Take 1 capsule by mouth every morning.   levothyroxine (SYNTHROID) 137 MCG tablet Take 1 tablet (137 mcg total)  by mouth daily before breakfast.   Nutritional Supplements (BOOST PLUS PO) Take 1 Can by mouth once. 1 can orally one time a day for weight loss   nystatin cream (MYCOSTATIN) Apply 1 Application topically every 8 (eight) hours as needed (Yeast).   olopatadine (PATANOL) 0.1 % ophthalmic solution Place 1 drop into both eyes daily as needed for allergies.   ondansetron (ZOFRAN) 8 MG tablet Take 1 tablet (8 mg total) by mouth every 8 (eight) hours as needed for nausea or vomiting.   pantoprazole (PROTONIX) 40 MG tablet Take 1 tablet (40 mg total) by mouth 2 (two) times daily.   sucralfate (CARAFATE) 1 g tablet Take 1 g by mouth 4 (four) times daily -  with meals and at bedtime.   Zinc Oxide 10 % OINT Apply 1 Application topically as needed.   No facility-administered encounter medications on file as of 05/12/2023.    Review of Systems  Constitutional:  Negative for activity change, appetite change and fever.  HENT:  Negative for sore throat.   Eyes: Negative.   Cardiovascular:  Positive for leg swelling. Negative for chest pain.  Gastrointestinal:  Negative for abdominal distention, diarrhea and vomiting.  Genitourinary:  Negative for dysuria, frequency and urgency.  Skin:  Negative for color change.  Neurological:  Negative for dizziness and headaches.  Psychiatric/Behavioral:  Negative for behavioral problems and sleep disturbance. The patient is not nervous/anxious.       Immunization History  Administered Date(s) Administered   Fluad Quad(high Dose 65+) 11/26/2022   Influenza, Quadrivalent, Recombinant, Inj, Pf 10/04/2020   Moderna Covid-19 Vaccine Bivalent Booster 69yrs & up 12/03/2022   PFIZER Comirnaty(Gray Top)Covid-19 Tri-Sucrose Vaccine 03/05/2019, 03/28/2019   PPD Test 08/30/2022, 09/13/2022   Pfizer Covid-19 Vaccine Bivalent Booster 17yrs & up 01/12/2020   Zoster Recombinant(Shingrix) 04/18/2021, 08/20/2021   Pertinent  Health Maintenance Due  Topic Date Due   INFLUENZA  VACCINE  08/28/2023      11/24/2022   12:05 PM 11/26/2022    1:45 PM 12/24/2022   12:20 PM 01/14/2023    3:16 PM 02/26/2023    8:59 AM  Fall Risk  Falls in the past year? 1 1 1 1  0  Was there an injury with Fall? 1 1 1 1    Fall Risk Category Calculator 3 2 2 2    Patient at Risk for Falls Due to History of fall(s);Impaired balance/gait;Impaired mobility History of fall(s);Impaired balance/gait;Impaired mobility History of fall(s);Impaired balance/gait;Impaired mobility History of fall(s);Impaired balance/gait;Impaired mobility   Fall risk Follow up Falls evaluation completed;Education provided;Falls prevention discussed Falls evaluation completed;Education provided;Falls prevention discussed Falls evaluation completed;Education provided;Falls prevention discussed Falls evaluation completed;Education provided;Falls prevention discussed      Vitals:   05/12/23 1214  BP: 131/71  Pulse: 73  Resp: 20  Temp: 97.9 F (36.6 C)  SpO2: 100%  Weight: 202 lb 3.2 oz (91.7 kg)  Height: 5\' 8"  (1.727 m)   Body mass index is 30.74 kg/m.  Physical Exam Constitutional:      Appearance: He is obese.  HENT:     Head: Normocephalic and atraumatic.     Mouth/Throat:     Mouth: Mucous membranes are moist.  Eyes:     Conjunctiva/sclera: Conjunctivae normal.  Cardiovascular:     Rate and Rhythm: Normal rate and regular rhythm.     Pulses: Normal pulses.     Heart sounds: Normal heart sounds.  Pulmonary:     Effort: Pulmonary effort is normal.     Breath sounds: Normal breath sounds.  Abdominal:     General: Bowel sounds are normal.     Palpations: Abdomen is soft.  Musculoskeletal:        General: Swelling present. Normal range of motion.     Cervical back: Normal range of motion.     Right lower leg: Edema present.     Left lower leg: Edema present.     Comments: BLE 2-3+edema  Skin:    General: Skin is warm and dry.  Neurological:     General: No focal deficit present.     Mental  Status: He is alert and oriented to person, place, and time.  Psychiatric:        Mood and Affect: Mood normal.        Behavior: Behavior normal.        Thought Content: Thought content normal.        Judgment: Judgment normal.      Labs reviewed: Recent Labs    07/08/22 0237 07/09/22 0126 08/03/22 0252 08/04/22 0301 08/05/22 1610 08/06/22 0605 08/21/22 0247 08/22/22 0116 12/18/22 1222 01/16/23 1240 02/12/23 1313  NA 137   < > 144 142 138   < >  --    < > 139 141 139  K 4.1   < > 3.4* 4.5 4.1   < >  --    < > 4.1 4.1 4.0  CL 104   < > 114* 117* 109   < >  --    < > 106 106 105  CO2 22   < > 19* 20* 21*   < >  --    < > 26 27 26   GLUCOSE 104*   < > 124* 103* 97   < >  --    < > 115* 84 94  BUN 29*   < > 32* 31* 33*   < >  --    < > 28* 31* 36*  CREATININE 1.58*   < > 1.58* 1.69* 1.68*   < >  --    < > 1.34* 1.51* 1.57*  CALCIUM 8.6*   < > 8.2* 7.9* 8.2*   < >  --    < > 9.5 9.7 10.1  MG 1.7  --  1.4* 2.1 2.0  --  1.7  --   --   --   --   PHOS 2.1*  --  3.2  --   --   --   --   --   --   --   --    < > = values in this interval not displayed.   Recent Labs    12/18/22 1222 01/16/23 1240 02/12/23 1313  AST 51* 35 41  ALT 53* 31 37  ALKPHOS 128* 105 117  BILITOT 0.5 0.5  0.4  PROT 6.5 6.8 7.4  ALBUMIN 3.2* 3.6 3.6   Recent Labs    02/12/23 1313 03/19/23 1223 04/30/23 1138  WBC 5.8 5.0 5.5  NEUTROABS 3.9 3.0 3.6  HGB 10.9* 11.6* 10.2*  HCT 33.3* 35.2* 30.9*  MCV 102.5* 103.2* 102.7*  PLT 201 152 153   Lab Results  Component Value Date   TSH 0.14 (A) 01/19/2023   Lab Results  Component Value Date   HGBA1C 4.4 (L) 08/02/2022   No results found for: "CHOL", "HDL", "LDLCALC", "LDLDIRECT", "TRIG", "CHOLHDL"  Significant Diagnostic Results in last 30 days:  No results found.  Assessment/Plan  1. Lower extremity edema (Primary) -  was bumetanide and was discontinued due to thirst -Denies pain on lower extremities -Will start on Lasix 20 mg daily on  MWF -   Encourage elevation of lower extremities - furosemide (LASIX) 20 MG tablet; Take 1 tablet (20 mg total) by mouth every Monday, Wednesday, and Friday.  Dispense: 12 tablet; Refill: 3  2. Chronic heart failure with preserved ejection fraction (HFpEF) (HCC) -No shortness of breath - Continue dapagliflozin 5 mg daily -   Follows up with cardiology  3. Stage 3b chronic kidney disease (HCC) Lab Results  Component Value Date   NA 139 02/12/2023   K 4.0 02/12/2023   CO2 26 02/12/2023   GLUCOSE 94 02/12/2023   BUN 36 (H) 02/12/2023   CREATININE 1.57 (H) 02/12/2023   CALCIUM 10.1 02/12/2023   GFR 33.16 (L) 01/15/2022   GFRNONAA 42 (L) 02/12/2023    -  will re-check BMP for monitoring  4. Cancer of ampulla of Vater (HCC) -  Has infusion every 6 weeks at the cancer center, -   Follows up with oncology    Family/ staff Communication: Discussed plan of care with resident and charge nurse.  Labs/tests ordered:  BMP    Lyric Rossano Medina-Vargas, DNP, MSN, FNP-BC [facility] [phone] (Monday-Friday 8:00 a.m. - 5:00 p.m.) [phone] (after hours)

## 2023-05-13 NOTE — Telephone Encounter (Signed)
 I have spoke with Mrs. Carl Woods and she confirmed Carl Woods has his Cpap machine

## 2023-05-19 ENCOUNTER — Non-Acute Institutional Stay: Payer: Self-pay | Admitting: Adult Health

## 2023-05-19 ENCOUNTER — Encounter: Payer: Self-pay | Admitting: Adult Health

## 2023-05-19 DIAGNOSIS — I5032 Chronic diastolic (congestive) heart failure: Secondary | ICD-10-CM | POA: Diagnosis not present

## 2023-05-19 DIAGNOSIS — C241 Malignant neoplasm of ampulla of Vater: Secondary | ICD-10-CM

## 2023-05-19 DIAGNOSIS — R600 Localized edema: Secondary | ICD-10-CM

## 2023-05-19 DIAGNOSIS — E876 Hypokalemia: Secondary | ICD-10-CM

## 2023-05-19 MED ORDER — POTASSIUM CHLORIDE CRYS ER 10 MEQ PO TBCR: 10.0000 meq | EXTENDED_RELEASE_TABLET | ORAL | Status: DC

## 2023-05-19 NOTE — Progress Notes (Signed)
 Location:  [facility] Room Number: 31 A Place of Service:  ALF [phone]) Provider:  Medina-Vargas, Darren Caldron, DNP, FNP-BC  Patient Care Team: Marguerite Shiley, MD as PCP - General (Internal Medicine) Knox Perl, MD as PCP - Cardiology (Cardiology) Candyce Champagne, MD as Consulting Physician (General Surgery) Kenney Peacemaker, MD as Consulting Physician (Gastroenterology) Janita Mellow, MD as Consulting Physician (Otolaryngology) Knox Perl, MD as Consulting Physician (Cardiology) Reggy Capers as Physician Assistant (Dermatology) Sonja Otway, MD as Consulting Physician (Oncology)  Extended Emergency Contact Information Primary Emergency Contact: Mote,[name] [facility]: [facility]          [address]          [place], [place] [address] [place] Mobile Phone: [phone] Relation: Daughter Secondary Emergency Contact: Glori Lard, [place] [address] [place] Home Phone: [phone] Mobile Phone: [phone] Relation: Son  Code Status:   DNR  Goals of care: Advanced Directive information    01/23/2023    9:46 AM  Advanced Directives  Does Patient Have a Medical Advance Directive? Yes  Type of Estate agent of [place];Living will;Out of facility DNR (pink MOST or yellow form)  Does patient want to make changes to medical advance directive? No - Patient declined  Copy of Healthcare Power of Attorney in Chart? Yes - validated most recent copy scanned in chart (See row information)     Chief Complaint  Patient presents with   Acute Visit    Low K+    HPI:  Pt is a 89 y.o. male seen today for an acute visit regarding low K+.  He is a resident of [facility].  He was recently started on Lasix  20 mg daily on MWF.  They have this labs showed normal K+ 3.3, drop from 3.6 (04/20/2022).  He was started on Lasix  for bilateral lower extremity edema.  He stated  that his lower extremity are not as tight as before.  He has CHF and currently on dapagliflozin5 mg daily.  He denies shortness of breath.  He has cancer of the ampulla of later and currently on infusion every 6 weeks at the cancer center.  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   Left Forehead (treatment after biopsy)   OSA on CPAP    Perianal rash    Recurrent epistaxis    Renal cyst 01/04/2013   Small left peripelvic renal cysts , noted on US  Renal   SCCA (squamous cell carcinoma) of skin 10/17/2015   Left Sup Bridge of Nose (curet, cautery and 5FU)   Seasonal allergies    Superficial basal cell carcinoma (BCC) 10/17/2015   Left Bulb of Nose (curet, cautery and 5FU)  Tubular adenoma    Past Surgical History:  Procedure Laterality Date   BILIARY STENT PLACEMENT N/A 01/23/2022   Procedure: BILIARY STENT PLACEMENT;  Surgeon: Normie Becton., MD;  Location: [facility];  Service: Gastroenterology;  Laterality: N/A;   BILIARY STENT PLACEMENT N/A 08/06/2022   Procedure: BILIARY STENT PLACEMENT;  Surgeon: Lajuan Pila, MD;  Location: [facility];  Service: Gastroenterology;  Laterality: N/A;   BIOPSY  01/23/2022   Procedure: BIOPSY;  Surgeon: Brice Campi Albino Alu., MD;  Location: [facility];  Service: Gastroenterology;;   BIOPSY  07/09/2022   Procedure: BIOPSY;  Surgeon: Annis Kinder, DO;  Location: [facility];   Service: Gastroenterology;;   BIOPSY  08/02/2022   Procedure: BIOPSY;  Surgeon: Albertina Hugger, MD;  Location: [facility];  Service: Gastroenterology;;   COLONOSCOPY  multiple   CYSTOSCOPY     ENDOSCOPIC RETROGRADE CHOLANGIOPANCREATOGRAPHY (ERCP) WITH PROPOFOL  N/A 01/23/2022   Procedure: ENDOSCOPIC RETROGRADE CHOLANGIOPANCREATOGRAPHY (ERCP) WITH PROPOFOL ;  Surgeon: Normie Becton., MD;  Location: [facility];  Service: Gastroenterology;  Laterality: N/A;   ENTEROSCOPY N/A 08/02/2022   Procedure: ENTEROSCOPY;  Surgeon: Albertina Hugger, MD;  Location: [facility];  Service: Gastroenterology;  Laterality: N/A;   ERCP N/A 08/06/2022   Procedure: ENDOSCOPIC RETROGRADE CHOLANGIOPANCREATOGRAPHY (ERCP);  Surgeon: Lajuan Pila, MD;  Location: [facility];  Service: Gastroenterology;  Laterality: N/A;   ESOPHAGOGASTRODUODENOSCOPY  multiple   ESOPHAGOGASTRODUODENOSCOPY N/A 01/23/2022   Procedure: ESOPHAGOGASTRODUODENOSCOPY (EGD);  Surgeon: Normie Becton., MD;  Location: [facility];  Service: Gastroenterology;  Laterality: N/A;   ESOPHAGOGASTRODUODENOSCOPY N/A 07/09/2022   Procedure: ESOPHAGOGASTRODUODENOSCOPY (EGD);  Surgeon: Annis Kinder, DO;  Location: [facility];  Service: Gastroenterology;  Laterality: N/A;   ESOPHAGOGASTRODUODENOSCOPY N/A 08/21/2022   Procedure: ESOPHAGOGASTRODUODENOSCOPY (EGD);  Surgeon: Elois Hair, MD;  Location[facility] ENDOSCOPY;  Service: Gastroenterology;  Laterality: N/A;   EUS N/A 01/23/2022   Procedure: UPPER ENDOSCOPIC ULTRASOUND (EUS) RADIAL;  Surgeon: Normie Becton., MD;  Location: [facility];  Service: Gastroenterology;  Laterality: N/A;   EVALUATION UNDER ANESTHESIA WITH FISTULECTOMY N/A 03/02/2018   Procedure: ANORECTAL EXAM UNDER ANESTHESIA WITH REPAIR OF SUPERFICIAL PERIRECTAL FISTULA AND HEMORRHOIDECTOMY;  Surgeon: Candyce Champagne, MD;  Location: [facility];  Service: General;  Laterality: N/A;   EXPLORATORY LAPAROTOMY      HEMOSTASIS CLIP PLACEMENT  07/09/2022   Procedure: HEMOSTASIS CLIP PLACEMENT;  Surgeon: Annis Kinder, DO;  Location: [facility];  Service: Gastroenterology;;   HOT HEMOSTASIS N/A 08/02/2022   Procedure: HOT HEMOSTASIS (ARGON PLASMA COAGULATION/BICAP);  Surgeon: Albertina Hugger, MD;  Location: [facility];  Service: Gastroenterology;  Laterality: N/A;   HOT HEMOSTASIS N/A 08/21/2022   Procedure: HOT HEMOSTASIS (ARGON PLASMA COAGULATION/BICAP);  Surgeon: Elois Hair, MD;  Location: [facility] ENDOSCOPY;  Service: Gastroenterology;  Laterality: N/A;   IR THORACENTESIS ASP PLEURAL SPACE W/IMG GUIDE  07/10/2022   REMOVAL OF STONES  01/23/2022   Procedure: REMOVAL OF STONES;  Surgeon: Brice Campi Albino Alu., MD;  Location: [facility];  Service: Gastroenterology;;   REMOVAL OF STONES  08/06/2022   Procedure: REMOVAL OF STONES;  Surgeon: Lajuan Pila, MD;  Location: [facility];  Service: Gastroenterology;;   [address]  01/23/2022   Procedure: [address];  Surgeon: Mansouraty, Albino Alu., MD;  Location: [facility];  Service: Gastroenterology;;    Allergies  Allergen Reactions   Nsaids Other (See Comments)    Patient is to not take these because of kidney issues    Outpatient Encounter Medications as of 05/19/2023  Medication Sig   acetaminophen  (TYLENOL ) 325 MG tablet Take 650 mg by mouth every 4 (four) hours as needed for fever or moderate pain (pain score 4-6).   cetirizine (ZYRTEC) 10 MG tablet Take 10 mg by mouth daily as needed for allergies.   Cholecalciferol  (VITAMIN D3) 5000 units CAPS Take 5,000 Units by mouth every morning.   dapagliflozin  propanediol (FARXIGA ) 5 MG TABS tablet Take 1 tablet (5 mg total) by mouth in the morning.   furosemide  (LASIX ) 20 MG tablet Take 1 tablet (20 mg total) by mouth every Monday, Wednesday, and Friday.   Glucosamine 500 MG CAPS Take 1 capsule by mouth every morning.   levothyroxine  (SYNTHROID ) 137 MCG tablet Take 1 tablet (137 mcg  total) by mouth daily before breakfast.   Nutritional Supplements (BOOST PLUS PO) Take 1 Can by mouth once. 1 can orally one time a day for weight loss   nystatin  cream (MYCOSTATIN ) Apply 1 Application topically every 8 (eight) hours as needed (Yeast).   olopatadine  (PATANOL) 0.1 % ophthalmic solution Place 1 drop into both eyes daily as needed for allergies.   ondansetron  (ZOFRAN ) 8 MG tablet Take 1 tablet (8 mg total) by mouth every 8 (eight) hours as needed for nausea or vomiting.   pantoprazole  (PROTONIX ) 40 MG tablet Take 1 tablet (40 mg total) by mouth 2 (two) times daily.   sucralfate  (CARAFATE ) 1 g tablet Take 1 g by mouth 4 (four) times daily -  with meals and at bedtime.   Zinc Oxide 10 % OINT Apply 1 Application topically as needed.   No facility-administered encounter medications on file as of 05/19/2023.    Review of Systems  Constitutional:  Negative for activity change, appetite change and fever.  HENT:  Negative for sore throat.   Eyes: Negative.   Cardiovascular:  Positive for leg swelling. Negative for chest pain.  Gastrointestinal:  Negative for abdominal distention, diarrhea and vomiting.  Genitourinary:  Negative for dysuria, frequency and urgency.  Skin:  Negative for color change.  Neurological:  Negative for dizziness and headaches.  Psychiatric/Behavioral:  Negative for behavioral problems and sleep disturbance. The patient is not nervous/anxious.       Immunization History  Administered Date(s) Administered   Fluad Quad(high Dose 65+) 11/26/2022   Influenza, Quadrivalent, Recombinant, Inj, Pf 10/04/2020   Moderna Covid-19 Vaccine Bivalent Booster 66yrs & up 12/03/2022   PFIZER Comirnaty(Gray Top)Covid-19 Tri-Sucrose Vaccine 03/05/2019, 03/28/2019   PPD Test 08/30/2022, 09/13/2022   Pfizer Covid-19 Vaccine Bivalent Booster 51yrs & up 01/12/2020   Zoster Recombinant(Shingrix) 04/18/2021, 08/20/2021   Pertinent  Health Maintenance Due  Topic Date Due    INFLUENZA VACCINE  08/28/2023      11/24/2022   12:05 PM 11/26/2022    1:45 PM 12/24/2022   12:20 PM 01/14/2023    3:16 PM 02/26/2023    8:59 AM  Fall Risk  Falls in the past year? 1 1 1 1  0  Was there an injury with Fall? 1 1 1 1    Fall Risk Category Calculator 3 2 2 2    Patient at Risk for Falls Due to History of fall(s);Impaired balance/gait;Impaired mobility History of fall(s);Impaired balance/gait;Impaired mobility History of fall(s);Impaired balance/gait;Impaired mobility History of fall(s);Impaired balance/gait;Impaired mobility   Fall risk Follow up Falls evaluation completed;Education provided;Falls prevention discussed Falls evaluation completed;Education provided;Falls prevention discussed Falls evaluation completed;Education provided;Falls prevention discussed Falls evaluation completed;Education provided;Falls prevention discussed      Vitals:   05/19/23 1658  BP: 131/68  Pulse:  69  Resp: 20  Temp: 98.4 F (36.9 C)  SpO2: 98%  Weight: 202 lb 3.2 oz (91.7 kg)  Height: 5\' 8"  (1.727 m)   Body mass index is 30.74 kg/m.  Physical Exam Constitutional:      General: He is not in acute distress.    Appearance: He is obese.  HENT:     Head: Normocephalic and atraumatic.     Mouth/Throat:     Mouth: Mucous membranes are moist.  Eyes:     Conjunctiva/sclera: Conjunctivae normal.  Cardiovascular:     Rate and Rhythm: Normal rate and regular rhythm.     Pulses: Normal pulses.     Heart sounds: Normal heart sounds.  Pulmonary:     Effort: Pulmonary effort is normal.     Breath sounds: Normal breath sounds.  Abdominal:     General: Bowel sounds are normal.     Palpations: Abdomen is soft.  Musculoskeletal:        General: Swelling present. Normal range of motion.     Cervical back: Normal range of motion.     Right lower leg: Edema present.     Left lower leg: Edema present.     Comments: BLE edema 2+  Skin:    General: Skin is warm and dry.  Neurological:      General: No focal deficit present.     Mental Status: He is alert and oriented to person, place, and time.  Psychiatric:        Mood and Affect: Mood normal.        Behavior: Behavior normal.        Thought Content: Thought content normal.        Judgment: Judgment normal.        Labs reviewed: Recent Labs    07/08/22 0237 07/09/22 0126 08/03/22 0252 08/04/22 0301 08/05/22 7829 08/06/22 0605 08/21/22 0247 08/22/22 0116 12/18/22 1222 01/16/23 1240 02/12/23 1313  NA 137   < > 144 142 138   < >  --    < > 139 141 139  K 4.1   < > 3.4* 4.5 4.1   < >  --    < > 4.1 4.1 4.0  CL 104   < > 114* 117* 109   < >  --    < > 106 106 105  CO2 22   < > 19* 20* 21*   < >  --    < > 26 27 26   GLUCOSE 104*   < > 124* 103* 97   < >  --    < > 115* 84 94  BUN 29*   < > 32* 31* 33*   < >  --    < > 28* 31* 36*  CREATININE 1.58*   < > 1.58* 1.69* 1.68*   < >  --    < > 1.34* 1.51* 1.57*  CALCIUM  8.6*   < > 8.2* 7.9* 8.2*   < >  --    < > 9.5 9.7 10.1  MG 1.7  --  1.4* 2.1 2.0  --  1.7  --   --   --   --   PHOS 2.1*  --  3.2  --   --   --   --   --   --   --   --    < > = values in this interval not displayed.   Recent Labs    12/18/22  1222 01/16/23 1240 02/12/23 1313  AST 51* 35 41  ALT 53* 31 37  ALKPHOS 128* 105 117  BILITOT 0.5 0.5 0.4  PROT 6.5 6.8 7.4  ALBUMIN 3.2* 3.6 3.6   Recent Labs    02/12/23 1313 03/19/23 1223 04/30/23 1138  WBC 5.8 5.0 5.5  NEUTROABS 3.9 3.0 3.6  HGB 10.9* 11.6* 10.2*  HCT 33.3* 35.2* 30.9*  MCV 102.5* 103.2* 102.7*  PLT 201 152 153   Lab Results  Component Value Date   TSH 0.14 (A) 01/19/2023   Lab Results  Component Value Date   HGBA1C 4.4 (L) 08/02/2022   No results found for: "CHOL", "HDL", "LDLCALC", "LDLDIRECT", "TRIG", "CHOLHDL"  Significant Diagnostic Results in last 30 days:  No results found.  Assessment/Plan  1. Hypokalemia (Primary) -  K- 3.3, 05/18/23 - Will start on KCl 10 mEq daily on MWF -  BMP in 1 week -  potassium chloride  (KLOR-CON  M) 10 MEQ tablet; Take 1 tablet (10 mEq total) by mouth every Monday, Wednesday, and Friday.  2. Bilateral lower extremity edema -Continue Lasix  20 mg daily on MWF - Encouraged elevation of lower extremities when in bed and when sitting down for a long time  3. Chronic heart failure with preserved ejection fraction (HFpEF) (HCC) -No shortness of breath  -   Continue dapagliflozin  5 mg daily -   follows up with cardiology  4. Cancer of ampulla of Vater (HCC) -   Has infusion every 6 weeks at the cancer center -   Follows up with oncology    Family/ staff Communication: Discussed plan of care with resident and charge nurse.  Labs/tests ordered:  BMP in 1 week    Inge Mangle, DNP, MSN, FNP-BC [facility] [phone] (Monday-Friday 8:00 a.m. - 5:00 p.m.) [phone] (after hours)

## 2023-06-11 ENCOUNTER — Encounter: Payer: Self-pay | Admitting: Hematology

## 2023-06-11 ENCOUNTER — Inpatient Hospital Stay: Payer: PPO

## 2023-06-11 ENCOUNTER — Inpatient Hospital Stay: Payer: PPO | Admitting: Nurse Practitioner

## 2023-06-11 ENCOUNTER — Inpatient Hospital Stay: Payer: PPO | Attending: Physician Assistant

## 2023-06-11 VITALS — BP 130/58 | HR 78 | Resp 16

## 2023-06-11 VITALS — BP 120/60 | HR 87 | Temp 97.6°F | Ht 68.0 in | Wt 215.6 lb

## 2023-06-11 DIAGNOSIS — Z5112 Encounter for antineoplastic immunotherapy: Secondary | ICD-10-CM | POA: Insufficient documentation

## 2023-06-11 DIAGNOSIS — C241 Malignant neoplasm of ampulla of Vater: Secondary | ICD-10-CM | POA: Diagnosis not present

## 2023-06-11 DIAGNOSIS — D519 Vitamin B12 deficiency anemia, unspecified: Secondary | ICD-10-CM | POA: Diagnosis not present

## 2023-06-11 DIAGNOSIS — K5521 Angiodysplasia of colon with hemorrhage: Secondary | ICD-10-CM

## 2023-06-11 DIAGNOSIS — D649 Anemia, unspecified: Secondary | ICD-10-CM

## 2023-06-11 LAB — CBC WITH DIFFERENTIAL (CANCER CENTER ONLY)
Abs Immature Granulocytes: 0.02 10*3/uL (ref 0.00–0.07)
Basophils Absolute: 0 10*3/uL (ref 0.0–0.1)
Basophils Relative: 1 %
Eosinophils Absolute: 0.2 10*3/uL (ref 0.0–0.5)
Eosinophils Relative: 3 %
HCT: 29.7 % — ABNORMAL LOW (ref 39.0–52.0)
Hemoglobin: 10 g/dL — ABNORMAL LOW (ref 13.0–17.0)
Immature Granulocytes: 0 %
Lymphocytes Relative: 18 %
Lymphs Abs: 1.1 10*3/uL (ref 0.7–4.0)
MCH: 33.4 pg (ref 26.0–34.0)
MCHC: 33.7 g/dL (ref 30.0–36.0)
MCV: 99.3 fL (ref 80.0–100.0)
Monocytes Absolute: 0.8 10*3/uL (ref 0.1–1.0)
Monocytes Relative: 13 %
Neutro Abs: 3.9 10*3/uL (ref 1.7–7.7)
Neutrophils Relative %: 65 %
Platelet Count: 157 10*3/uL (ref 150–400)
RBC: 2.99 MIL/uL — ABNORMAL LOW (ref 4.22–5.81)
RDW: 19.5 % — ABNORMAL HIGH (ref 11.5–15.5)
WBC Count: 5.9 10*3/uL (ref 4.0–10.5)
nRBC: 0 % (ref 0.0–0.2)

## 2023-06-11 LAB — COMPREHENSIVE METABOLIC PANEL WITH GFR
ALT: 21 U/L (ref 0–44)
AST: 27 U/L (ref 15–41)
Albumin: 3.5 g/dL (ref 3.5–5.0)
Alkaline Phosphatase: 96 U/L (ref 38–126)
Anion gap: 8 (ref 5–15)
BUN: 26 mg/dL — ABNORMAL HIGH (ref 8–23)
CO2: 24 mmol/L (ref 22–32)
Calcium: 8.6 mg/dL — ABNORMAL LOW (ref 8.9–10.3)
Chloride: 111 mmol/L (ref 98–111)
Creatinine, Ser: 1.64 mg/dL — ABNORMAL HIGH (ref 0.61–1.24)
GFR, Estimated: 39 mL/min — ABNORMAL LOW (ref >60)
Glucose, Bld: 89 mg/dL (ref 70–99)
Potassium: 3.5 mmol/L (ref 3.5–5.1)
Sodium: 143 mmol/L (ref 135–145)
Total Bilirubin: 0.5 mg/dL (ref 0.0–1.2)
Total Protein: 6.8 g/dL (ref 6.5–8.1)

## 2023-06-11 LAB — FERRITIN: Ferritin: 76 ng/mL (ref 24–336)

## 2023-06-11 LAB — SAMPLE TO BLOOD BANK

## 2023-06-11 LAB — TOTAL PROTEIN, URINE DIPSTICK: Protein, ur: 30 mg/dL — AB

## 2023-06-11 MED ORDER — SODIUM CHLORIDE 0.9 % IV SOLN: Freq: Once | INTRAVENOUS | Status: AC

## 2023-06-11 MED ORDER — CYANOCOBALAMIN 1000 MCG/ML IJ SOLN
1000.0000 ug | Freq: Once | INTRAMUSCULAR | Status: AC
  Administered 2023-06-11: 1000 ug via INTRAMUSCULAR
  Filled 2023-06-11: qty 1

## 2023-06-11 MED ORDER — BEVACIZUMAB-ADCD CHEMO INJECTION 400 MG/16ML
5.0000 mg/kg | Freq: Once | INTRAVENOUS | Status: AC
  Administered 2023-06-11: 400 mg via INTRAVENOUS
  Filled 2023-06-11: qty 16

## 2023-06-11 NOTE — Progress Notes (Signed)
 Maintain bevacizumab  dose at 400mg  despite wt gain per Rande Bushy, NP, as pt has some lower extremity swelling.  Naela Nodal, PharmD, MBA

## 2023-06-11 NOTE — Patient Instructions (Signed)
 [facility] - A DEPT OF [facility]  Discharge Instructions: Thank you for choosing [facility] to provide your oncology and hematology care.   If you have a lab appointment with the Cancer Center, please go directly to the Cancer Center and check in at the registration area.   Wear comfortable clothing and clothing appropriate for easy access to any Portacath or PICC line.   We strive to give you quality time with your provider. You may need to reschedule your appointment if you arrive late (15 or more minutes).  Arriving late affects you and other patients whose appointments are after yours.  Also, if you miss three or more appointments without notifying the office, you may be dismissed from the clinic at the provider's discretion.      For prescription refill requests, have your pharmacy contact our office and allow 72 hours for refills to be completed.    Today you received the following chemotherapy and/or immunotherapy agents: Vegzelma      To help prevent nausea and vomiting after your treatment, we encourage you to take your nausea medication as directed.  BELOW ARE SYMPTOMS THAT SHOULD BE REPORTED IMMEDIATELY: *FEVER GREATER THAN 100.4 F (38 C) OR HIGHER *CHILLS OR SWEATING *NAUSEA AND VOMITING THAT IS NOT CONTROLLED WITH YOUR NAUSEA MEDICATION *UNUSUAL SHORTNESS OF BREATH *UNUSUAL BRUISING OR BLEEDING *URINARY PROBLEMS (pain or burning when urinating, or frequent urination) *BOWEL PROBLEMS (unusual diarrhea, constipation, pain near the anus) TENDERNESS IN MOUTH AND THROAT WITH OR WITHOUT PRESENCE OF ULCERS (sore throat, sores in mouth, or a toothache) UNUSUAL RASH, SWELLING OR PAIN  UNUSUAL VAGINAL DISCHARGE OR ITCHING   Items with * indicate a potential emergency and should be followed up as soon as possible or go to the Emergency Department if any problems should occur.  Please show the CHEMOTHERAPY ALERT CARD or IMMUNOTHERAPY  ALERT CARD at check-in to the Emergency Department and triage nurse.  Should you have questions after your visit or need to cancel or reschedule your appointment, please contact [facility] - A DEPT OF [facility]  Dept: [phone]  and follow the prompts.  Office hours are 8:00 a.m. to 4:30 p.m. Monday - Friday. Please note that voicemails left after 4:00 p.m. may not be returned until the following business day.  We are closed weekends and major holidays. You have access to a nurse at all times for urgent questions. Please call the main number to the clinic Dept: [phone] and follow the prompts.   For any non-urgent questions, you may also contact your provider using MyChart. We now offer e-Visits for anyone 30 and older to request care online for non-urgent symptoms. For details visit [email or web address].   Also download the MyChart app! Go to the app store, search "MyChart", open the app, select [facility], and log in with your MyChart username and password.

## 2023-06-11 NOTE — Assessment & Plan Note (Addendum)
-  [id number], diagnosed in 12/2021, MMR normal  -I reviewed PET scan findings, which showed no evidence of distant metastasis  --I reviewed his molecular testing, MMR was normal, he is not a candidate for immunotherapy.  Foundation One showed no targetable mutations. -He completed RT on 04/09/2022 -Due to advanced age,chemotherapy was not offered  -CT scan in June 2024 showed no evidence of residual disease or new metastasis, although CT is not ideal to evaluate the residual disease in his case. - 06/10/2023 -continue with bevacizumab  infusion today and every 6 weeks.  Continue furosemide  as prescribed per primary provider at [facility].  May need to see cardiology if symptoms do not improve.

## 2023-06-11 NOTE — Progress Notes (Signed)
 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  Foundation One showed no targetable mutations. -He completed RT on 04/09/2022 -Due to advanced age,chemotherapy was not offered  -CT scan in June 2024 showed no evidence of residual disease or new metastasis, although CT is not ideal to evaluate the residual disease in his case. - 06/10/2023 -continue with bevacizumab  infusion today and every 6 weeks.  Continue furosemide  as prescribed per primary provider at [facility].  May need to see cardiology if symptoms do not improve.       Heat rash  Patient has noticed a rash in bilateral groin area.  He has seen his primary care provider at [facility].  Was recently prescribed cream for this.  Has been helpful in the past.  He has already noticed some improvement since he started using it again.  Atrial fibrillation Patient has long history of HTN, relatively well-controlled.  Does experience intermittent shortness of breath which has been ongoing.  He is currently at his baseline.  Leg edema Patient reporting bilateral leg edema.  Feels very tight.  [facility].  He started on furosemide .  This is  slowly improving his leg edema.   Plan: Reviewed labs. - CBC showing stable and mild anemia with Hgb 10.0 and HCT 29.7. -CMP showing stable but reduced renal functions with BUN 26, creatinine 1.64, EGFR 39.  There is 30+ proteinuria. Patient presentation and labs are satisfactory for treatment today. Proceed with bevacizumab  today. Labs with follow-up and bevacizumab  in next 3 months.  The patient understands the plans discussed today and is in agreement with them.  He knows to contact our office if he develops concerns prior to his next appointment.  I provided 25 minutes of face-to-face time during this encounter and > 50% was spent counseling as documented under my assessment and plan.    Sharyon Deis, NP  [facility] - A DEPT OF [facility]. [facility] [address] [place] [address] Dept: [phone] Dept Fax: [phone]   No orders of the defined types were placed in this encounter.     CHIEF COMPLAINT:  CC: cancer of ampulla of vater   Current Treatment:  bevacizumab  every 6 weeks  INTERVAL HISTORY:  Carl Woods is here today for repeat clinical assessment. Last saw Dr. Maryalice Smaller 04/30/2023.  Feeling well overall.  Denies problems or concerns.  States his energy has been okay.  He reports that a heat rash in the groin area.  Did see his provider at [facility] and was prescribed a cream for this which is already helping.  He is having moderate and bilateral leg edema.  Because he did see the primary care provider at [facility] for this as well.  Started on furosemide  for this.  He reports this is slowly improving.  He does have history of A-fib.  He denies chest pain, chest pressure, or unusual shortness of breath. He denies headaches or visual disturbances. He denies abdominal pain, nausea, vomiting, or changes in bowel or bladder habits.  He denies fevers or chills. He denies pain. His appetite is good. His weight has  increased 13 pounds over last 6 weeks.  I have reviewed the past medical history, past surgical history, social history and family history with the patient and they are unchanged from previous note.  ALLERGIES:  is allergic to nsaids.  MEDICATIONS:  Current Outpatient Medications  Medication Sig Dispense Refill   acetaminophen  (TYLENOL ) 325 MG tablet Take 650 mg by mouth every 4 (four) hours as needed for fever or moderate pain (pain score 4-6).     cetirizine (ZYRTEC) 10 MG tablet Take 10 mg by mouth daily as needed for allergies.     Cholecalciferol  (VITAMIN D3) 5000 units CAPS Take 5,000 Units by mouth every morning.     dapagliflozin  propanediol (FARXIGA ) 5 MG TABS tablet Take 1 tablet (5 mg total) by mouth in the morning.     furosemide  (LASIX ) 20 MG tablet Take 1 tablet (20 mg total) by mouth every Monday, Wednesday, and Friday. 12 tablet 3   Glucosamine 500 MG CAPS Take 1 capsule by mouth every morning.     levothyroxine  (SYNTHROID ) 137 MCG tablet Take 1 tablet (137 mcg total) by mouth daily before breakfast.     Nutritional Supplements (BOOST PLUS PO) Take 1 Can by mouth once. 1 can orally one time a day for weight loss     nystatin  cream (MYCOSTATIN ) Apply 1 Application topically every 8 (eight) hours as needed (Yeast).     olopatadine  (PATANOL) 0.1 % ophthalmic solution Place 1 drop into both eyes daily as needed for allergies.     ondansetron  (ZOFRAN ) 8 MG tablet Take 1 tablet (8 mg total) by mouth every 8 (eight) hours as needed for nausea or vomiting. 20 tablet 0   pantoprazole  (PROTONIX ) 40 MG tablet Take 1 tablet (40 mg total) by mouth 2 (two) times daily.     potassium chloride  (KLOR-CON  M) 10 MEQ tablet Take 1 tablet (10 mEq total) by mouth every Monday, Wednesday, and Friday.     sucralfate  (CARAFATE ) 1 g tablet Take 1 g by mouth 4 (four) times daily -  with meals and at bedtime.     Zinc Oxide 10 % OINT Apply 1 Application topically as needed.     No current  facility-administered medications for this visit.    HISTORY OF PRESENT ILLNESS:   Oncology History  Cancer of ampulla of Vater (HCC)  12/06/2021 Imaging   CT ABDOMEN PELVIS W CONTRAST   IMPRESSION: 1. There is moderate intrahepatic bile duct dilatation with marked fusiform dilatation of the common bile duct. No CT visible common bile duct stones identified. Differential considerations include a obstructing distal common bile duct stone (not visible by CT), distal CBD stricture, or mass at the level of the ampullary. Consider further evaluation with contrast enhanced MRI/MRCP or ERCP. 2. Contour the liver appears nodular which may reflect underlying cirrhosis. 3. Age-indeterminate compression fracture involving L1 vertebral body with loss of 50% of the vertebral body height. No signs of retropulsion of fracture fragments into the canal. 4. Fat containing umbilical hernia. 5. 5 mm anterolisthesis of L4 on L5. 6.  Aortic Atherosclerosis (ICD10-I70.0).   12/16/2021 Imaging  MR ABDOMEN MRCP W WO CONTAST   IMPRESSION: 1. Moderate intrahepatic and extrahepatic bile duct dilation with smooth tapering to the level of the ampulla without filling defect or focal mass lesion identified. Findings may reflect ampullary stricture or occult ampullary mass. Consider further evaluation with ERCP. 2. Mild dilated main pancreatic duct at the head measuring up to 8 mm. 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 Recommend follow-up pre and post-contrast MRI/MRCP in 2 years. This recommendation follows ACR consensus guidelines: Management of Incidental Pancreatic Cysts: A White Paper of the ACR Incidental Findings Committee. J Am Coll Radiol 2017;14:911-923. 3.  Aortic Atherosclerosis (ICD10-I70.0).   01/23/2022 Procedure   DG ERCP   IMPRESSION: Dilatation of the extrahepatic bile duct with severe  tapering or narrowing in the distal common bile duct. Placement of biliary stent.   These images were submitted for radiologic interpretation only. Please see the procedural report for the amount of contrast.    01/23/2022 Procedure   EUS/ERCP  ENDOSONOGRAPHIC FINDING: There was dilation in the common bile duct (12.3 mm -> 20.4 mm) and in the common hepatic duct (23.0 mm). A small amount of hyperechoic material consistent with sludge was visualized endosonographically in the common bile duct. Moderate hyperechoic material consistent with sludge was visualized endosonographically in the gallbladder with normal gallbladder wall thickness. Pancreatic parenchymal abnormalities were noted in the entire pancreas. These consisted of hyperechoic foci with shadowing and cysts. There was a 9.1 mm by 6.1 mm cyst in the neck of the pancreas. There was a 15.3 mm by 14.1 mm cyst in the tail of the pancreas. The pancreatic duct had a dilated endosonographic appearance, had a prominently branched endosonographic appearance and had hyperechoic walls in the pancreatic head (PD - 8.2 mm -> 4.3 mm), genu of the pancreas (3.5 mm), body of the pancreas (3.2 mm) and tail of the pancreas (2.8 mm). A hypoechoic irregular lesion was identified endosonographically at the ampulla. The lesion measured 15 mm by 13 mm in maximal cross-sectional diameter. The lesion extended from the mucosa to the submucosa. The outer margins were irregular. This is noted where CBD and PD dilation is noted to occur. Endosonographic imaging in the visualized portion of the liver showed no mass.  No malignant-appearing lymph nodes were visualized in the celiac region (level 20), peripancreatic region and porta hepatis region. The celiac region was visualized.   01/23/2022 Pathology Results   SURGICAL PATHOLOGY  CASE: [id number]  PATIENT: Carl Woods  Surgical Pathology Report   FINAL MICROSCOPIC DIAGNOSIS:   A. STOMACH,  RANDOM, BIOPSY:  - Gastric mucosa, no significant abnormality.  No inflammation,  intestinal metaplasia, dysplasia or malignancy.   B. AMPULLARY LESION, BIOPSY:  - Adenocarcinoma.  See comment.    01/31/2022 Initial Diagnosis   Cancer of ampulla of Vater (HCC)   02/07/2022 Imaging    IMPRESSION: Interval increase in size of lymph node in the anterior cardiophrenic space on the right.   Increase small right pleural effusion compared to the previous MRI.   Subtle nodular tissue along the margin of the right hepatic lobe in the upper abdomen posteriorly. Recommend additional workup when appropriate.   Calcified pleural plaques.   Evidence of chronic liver disease.   Aortic Atherosclerosis (ICD10-I70.0).   02/14/2022 Miscellaneous   Foundation One:  Biomarker Findings Microsatellite status-Cannot be determined Tumor Mutation Burden-Cannot be determined  Genomics Findings EPHB4 amplificatio SF3B1 K666T TP53 R273H  REVIEW OF SYSTEMS:   Constitutional: Denies fevers or chills.  Has had 13 pound weight gain since his last treatment.  This is likely to be fluid related, as he does have significant swelling in bilateral lower legs. Eyes: Denies blurriness of vision Ears, nose, mouth, throat, and face: Denies mucositis or sore throat Respiratory: Denies cough, dyspnea or wheezes Cardiovascular: Denies palpitation, chest discomfort or unusual shortness of breath.  He does have moderate and bilateral LE edema. Gastrointestinal:  Denies nausea, heartburn or change in bowel habits Skin: Denies abnormal skin rashes Lymphatics: Denies new lymphadenopathy or easy bruising Neurological:Denies numbness, tingling or new weaknesses Behavioral/Psych: Mood is stable, no new changes  All other systems were reviewed with the patient and are negative.   VITALS:   Today's Vitals   06/11/23 1327  BP: 120/60  Pulse: 87  Temp: 97.6 F (36.4 C)  TempSrc: Temporal  SpO2: (!) 17%   Weight: 215 lb 9.6 oz (97.8 kg)  Height: 5\' 8"  (1.727 m)  PF: 98 L/min  PainSc: 0-No pain   Body mass index is 32.78 kg/m.   Wt Readings from Last 3 Encounters:  06/11/23 215 lb 9.6 oz (97.8 kg)  05/19/23 202 lb 3.2 oz (91.7 kg)  05/12/23 202 lb 3.2 oz (91.7 kg)    Body mass index is 32.78 kg/m.  Performance status (ECOG): 1 - Symptomatic but completely ambulatory  PHYSICAL EXAM:   GENERAL:alert, no distress and comfortable SKIN: skin color, texture, turgor are normal, no rashes or significant lesions EYES: normal, Conjunctiva are pink and non-injected, sclera clear OROPHARYNX:no exudate, no erythema and lips, buccal mucosa, and tongue normal  NECK: supple, thyroid  normal size, non-tender, without nodularity LYMPH:  no palpable lymphadenopathy in the cervical, axillary or inguinal LUNGS: clear to auscultation and percussion with normal breathing effort HEART: Irregular heart rate and rhythm.  Soft, blowing, systolic murmur can be auscultated.  He does have 2+ pitting edema in bilateral lower legs.  Nontender.  No warmth or redness associated with the edema. ABDOMEN:abdomen soft, non-tender and normal bowel sounds Musculoskeletal:no cyanosis of digits and no clubbing  NEURO: alert & oriented x 3 with fluent speech, no focal motor/sensory deficits  LABORATORY DATA:  I have reviewed the data as listed    Component Value Date/Time   NA 143 06/11/2023 1251   K 3.5 06/11/2023 1251   CL 111 06/11/2023 1251   CO2 24 06/11/2023 1251   GLUCOSE 89 06/11/2023 1251   BUN 26 (H) 06/11/2023 1251   CREATININE 1.64 (H) 06/11/2023 1251   CREATININE 1.57 (H) 02/12/2023 1313   CALCIUM  8.6 (L) 06/11/2023 1251   PROT 6.8 06/11/2023 1251   ALBUMIN 3.5 06/11/2023 1251   AST 27 06/11/2023 1251   AST 41 02/12/2023 1313   ALT 21 06/11/2023 1251   ALT 37 02/12/2023 1313   ALKPHOS 96 06/11/2023 1251   BILITOT 0.5 06/11/2023 1251   BILITOT 0.4 02/12/2023 1313   GFRNONAA 39 (L) 06/11/2023  1251   GFRNONAA 42 (L) 02/12/2023 1313   GFRAA 55 (L) 02/23/2018 1345    Lab Results  Component Value Date   WBC 5.9 06/11/2023   NEUTROABS 3.9 06/11/2023   HGB 10.0 (L) 06/11/2023   HCT 29.7 (L) 06/11/2023   MCV 99.3 06/11/2023   PLT 157 06/11/2023

## 2023-06-12 LAB — CANCER ANTIGEN 19-9: CA 19-9: 17 U/mL (ref 0–35)

## 2023-06-13 ENCOUNTER — Other Ambulatory Visit: Payer: Self-pay

## 2023-06-16 ENCOUNTER — Ambulatory Visit: Admitting: Nurse Practitioner

## 2023-06-17 ENCOUNTER — Other Ambulatory Visit: Payer: Self-pay

## 2023-06-22 ENCOUNTER — Encounter: Payer: Self-pay | Admitting: Nurse Practitioner

## 2023-06-22 ENCOUNTER — Encounter: Payer: Self-pay | Admitting: Hematology

## 2023-06-29 ENCOUNTER — Encounter: Payer: Self-pay | Admitting: Orthopedic Surgery

## 2023-06-29 ENCOUNTER — Non-Acute Institutional Stay: Payer: Self-pay | Admitting: Orthopedic Surgery

## 2023-06-29 DIAGNOSIS — I5032 Chronic diastolic (congestive) heart failure: Secondary | ICD-10-CM | POA: Diagnosis not present

## 2023-06-29 DIAGNOSIS — E876 Hypokalemia: Secondary | ICD-10-CM

## 2023-06-29 DIAGNOSIS — R2242 Localized swelling, mass and lump, left lower limb: Secondary | ICD-10-CM | POA: Diagnosis not present

## 2023-06-29 DIAGNOSIS — L03113 Cellulitis of right upper limb: Secondary | ICD-10-CM

## 2023-06-29 DIAGNOSIS — R600 Localized edema: Secondary | ICD-10-CM

## 2023-06-29 MED ORDER — DOXYCYCLINE HYCLATE 100 MG PO TABS: 100.0000 mg | ORAL_TABLET | Freq: Two times a day (BID) | ORAL | Status: AC

## 2023-06-29 NOTE — Progress Notes (Addendum)
 Location:  [facility] Room Number: 31/A Place of Service:  ALF [phone]) Provider:  Arnetha Bhat, NP   Marguerite Shiley, MD  Patient Care Team: Marguerite Shiley, MD as PCP - General (Internal Medicine) Knox Perl, MD as PCP - Cardiology (Cardiology) Candyce Champagne, MD as Consulting Physician (General Surgery) Kenney Peacemaker, MD as Consulting Physician (Gastroenterology) Janita Mellow, MD as Consulting Physician (Otolaryngology) Knox Perl, MD as Consulting Physician (Cardiology) Reggy Capers as Physician Assistant (Dermatology) Sonja Deal, MD as Consulting Physician (Oncology)  Extended Emergency Contact Information Primary Emergency Contact: Callander,[name] Address: **[facility] 2x [facility]          [address]          [place], [place] [address] [place] Mobile Phone: [phone] Relation: Daughter Secondary Emergency Contact: Kaulin, Chaves          [place], [place] [address] [place] Home Phone: [phone] Mobile Phone: [phone] Relation: Son  Code Status:  DNR Goals of care: Advanced Directive information    01/23/2023    9:46 AM  Advanced Directives  Does Patient Have a Medical Advance Directive? Yes  Type of Estate agent of [place];Living will;Out of facility DNR (pink MOST or yellow form)  Does patient want to make changes to medical advance directive? No - Patient declined  Copy of Healthcare Power of Attorney in Chart? Yes - validated most recent copy scanned in chart (See row information)     Chief Complaint  Patient presents with   Acute Visit    Lower leg swelling    HPI:  Pt is a 89 y.o. male seen today for acute visit due to lower leg swelling.   He currently resides on the assisted living unit at [facility]. 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 Maryalice Smaller, Barrett's esophagus, GERD, GI  bleed, hypothyroidism, CKD, anemia, and unstable gait.   H/o CHF. 03/2023 Bumex  was discontinued due to declining kidney function. 04/15 furosemide  was started M/W/F due to increased lower leg edema and weight gain. Today, left leg appears double the size of right. He denies lower leg pain. He admits to feeling sharp "spark" to lower legs a few times daily. No recent injury or fall. He does have a history of atrial fibrillation. He is not on DOAC at this time. BUN/creat 26/1.64 06/11/2023. Afebrile.   Right posterior forearm with raised, tender bump. He noticed area about 2-3 weeks ago. Increased tenderness and redness began over the weekend. He reports having a history of boils in past. Afebrile. Vitals stable.   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   Left Forehead (treatment after biopsy)   OSA on CPAP  Perianal rash    Recurrent epistaxis    Renal cyst 01/04/2013   Small left peripelvic renal cysts , noted on US  Renal   SCCA (squamous cell carcinoma) of skin 10/17/2015   Left Sup Bridge of Nose (curet, cautery and 5FU)   Seasonal allergies    Superficial basal cell carcinoma (BCC) 10/17/2015   Left Bulb of Nose (curet, cautery and 5FU)   Tubular adenoma    Past Surgical History:  Procedure Laterality Date   BILIARY STENT PLACEMENT N/A 01/23/2022   Procedure: BILIARY STENT PLACEMENT;  Surgeon: Normie Becton., MD;   Location: [facility];  Service: Gastroenterology;  Laterality: N/A;   BILIARY STENT PLACEMENT N/A 08/06/2022   Procedure: BILIARY STENT PLACEMENT;  Surgeon: Lajuan Pila, MD;  Location: [facility];  Service: Gastroenterology;  Laterality: N/A;   BIOPSY  01/23/2022   Procedure: BIOPSY;  Surgeon: Brice Campi Albino Alu., MD;  Location: [facility];  Service: Gastroenterology;;   BIOPSY  07/09/2022   Procedure: BIOPSY;  Surgeon: Annis Kinder, DO;  Location: [facility];  Service: Gastroenterology;;   BIOPSY  08/02/2022   Procedure: BIOPSY;  Surgeon: Albertina Hugger, MD;  Location: [facility];  Service: Gastroenterology;;   COLONOSCOPY  multiple   CYSTOSCOPY     ENDOSCOPIC RETROGRADE CHOLANGIOPANCREATOGRAPHY (ERCP) WITH PROPOFOL  N/A 01/23/2022   Procedure: ENDOSCOPIC RETROGRADE CHOLANGIOPANCREATOGRAPHY (ERCP) WITH PROPOFOL ;  Surgeon: Normie Becton., MD;  Location: [facility];  Service: Gastroenterology;  Laterality: N/A;   ENTEROSCOPY N/A 08/02/2022   Procedure: ENTEROSCOPY;  Surgeon: Albertina Hugger, MD;  Location: [facility];  Service: Gastroenterology;  Laterality: N/A;   ERCP N/A 08/06/2022   Procedure: ENDOSCOPIC RETROGRADE CHOLANGIOPANCREATOGRAPHY (ERCP);  Surgeon: Lajuan Pila, MD;  Location: [facility];  Service: Gastroenterology;  Laterality: N/A;   ESOPHAGOGASTRODUODENOSCOPY  multiple   ESOPHAGOGASTRODUODENOSCOPY N/A 01/23/2022   Procedure: ESOPHAGOGASTRODUODENOSCOPY (EGD);  Surgeon: Normie Becton., MD;  Location: [facility];  Service: Gastroenterology;  Laterality: N/A;   ESOPHAGOGASTRODUODENOSCOPY N/A 07/09/2022   Procedure: ESOPHAGOGASTRODUODENOSCOPY (EGD);  Surgeon: Annis Kinder, DO;  Location: [facility];  Service: Gastroenterology;  Laterality: N/A;   ESOPHAGOGASTRODUODENOSCOPY N/A 08/21/2022   Procedure: ESOPHAGOGASTRODUODENOSCOPY (EGD);  Surgeon: Elois Hair, MD;  Location: [facility];  Service: Gastroenterology;   Laterality: N/A;   EUS N/A 01/23/2022   Procedure: UPPER ENDOSCOPIC ULTRASOUND (EUS) RADIAL;  Surgeon: Normie Becton., MD;  Location: [facility];  Service: Gastroenterology;  Laterality: N/A;   EVALUATION UNDER ANESTHESIA WITH FISTULECTOMY N/A 03/02/2018   Procedure: ANORECTAL EXAM UNDER ANESTHESIA WITH REPAIR OF SUPERFICIAL PERIRECTAL FISTULA AND HEMORRHOIDECTOMY;  Surgeon: Candyce Champagne, MD;  Location: [facility];  Service: General;  Laterality: N/A;   EXPLORATORY LAPAROTOMY     HEMOSTASIS CLIP PLACEMENT  07/09/2022   Procedure: HEMOSTASIS CLIP PLACEMENT;  Surgeon: Annis Kinder, DO;  Location: [facility];  Service: Gastroenterology;;   HOT HEMOSTASIS N/A 08/02/2022   Procedure: HOT HEMOSTASIS (ARGON PLASMA COAGULATION/BICAP);  Surgeon: Albertina Hugger, MD;  Location: [facility];  Service: Gastroenterology;  Laterality: N/A;   HOT HEMOSTASIS N/A 08/21/2022   Procedure: HOT HEMOSTASIS (ARGON PLASMA COAGULATION/BICAP);  Surgeon: Elois Hair, MD;  Location: [facility];  Service: Gastroenterology;  Laterality: N/A;   IR THORACENTESIS ASP PLEURAL SPACE W/IMG GUIDE  07/10/2022   REMOVAL OF STONES  01/23/2022   Procedure: REMOVAL OF STONES;  Surgeon: Brice Campi Albino Alu., MD;  Location: [facility];  Service: Gastroenterology;;   REMOVAL OF STONES  08/06/2022   Procedure: REMOVAL OF STONES;  Surgeon: Lajuan Pila, MD;  Location: [facility];  Service: Gastroenterology;;   [address]  01/23/2022   Procedure: [address];  Surgeon: Normie Becton., MD;  Location: [facility];  Service: Gastroenterology;;    Allergies  Allergen Reactions   Nsaids Other (See Comments)    Patient is to not take these because of kidney issues    Outpatient Encounter Medications as of 06/29/2023  Medication Sig   acetaminophen  (TYLENOL ) 325 MG tablet Take 650 mg by mouth every 4 (four) hours as needed for fever or moderate pain (pain score 4-6).   cetirizine (ZYRTEC) 10 MG  tablet Take 10 mg by mouth daily as needed for allergies.   Cholecalciferol  (VITAMIN D3) 5000 units CAPS Take 5,000 Units by mouth every morning.   dapagliflozin  propanediol (FARXIGA ) 5 MG TABS tablet Take 1 tablet (5 mg total) by mouth in the morning.   furosemide  (LASIX ) 20 MG tablet Take 1 tablet (20 mg total) by mouth every Monday, Wednesday, and Friday.   Glucosamine 500 MG CAPS Take 1 capsule by mouth every morning.   levothyroxine  (SYNTHROID ) 137 MCG tablet Take 1 tablet (137 mcg total) by mouth daily before breakfast.   Nutritional Supplements (BOOST PLUS PO) Take 1 Can by mouth once. 1 can orally one time a day for weight loss   nystatin  cream (MYCOSTATIN ) Apply 1 Application topically every 8 (eight) hours as needed (Yeast).   olopatadine  (PATANOL) 0.1 % ophthalmic solution Place 1 drop into both eyes daily as needed for allergies.   ondansetron  (ZOFRAN ) 8 MG tablet Take 1 tablet (8 mg total) by mouth every 8 (eight) hours as needed for nausea or vomiting.   pantoprazole  (PROTONIX ) 40 MG tablet Take 1 tablet (40 mg total) by mouth 2 (two) times daily.   potassium chloride  (KLOR-CON  M) 10 MEQ tablet Take 1 tablet (10 mEq total) by mouth every Monday, Wednesday, and Friday.   sucralfate  (CARAFATE ) 1 g tablet Take 1 g by mouth 4 (four) times daily -  with meals and at bedtime.   Zinc Oxide 10 % OINT Apply 1 Application topically as needed.   No facility-administered encounter medications on file as of 06/29/2023.    Review of Systems  Constitutional:  Negative for fatigue and fever.  HENT:  Negative for sore throat and trouble swallowing.   Respiratory:  Negative for cough, shortness of breath and wheezing.   Cardiovascular:  Positive for leg swelling. Negative for chest pain.  Gastrointestinal:  Negative for abdominal distention and abdominal pain.  Genitourinary:  Negative for dysuria and hematuria.  Musculoskeletal:  Positive for gait problem.  Skin:  Positive for color change and  wound.  Neurological:  Negative for dizziness and headaches.  Psychiatric/Behavioral:  Negative for dysphoric mood. The patient is not nervous/anxious.     Immunization History  Administered Date(s) Administered   Fluad Quad(high Dose 65+) 11/26/2022   Influenza, Quadrivalent, Recombinant, Inj, Pf 10/04/2020   Moderna Covid-19 Vaccine Bivalent Booster 3yrs & up 12/03/2022   PFIZER Comirnaty(Gray Top)Covid-19 Tri-Sucrose Vaccine 03/05/2019, 03/28/2019   PPD Test 08/30/2022, 09/13/2022   Pfizer Covid-19 Vaccine Bivalent Booster 3yrs & up 01/12/2020   Zoster Recombinant(Shingrix) 04/18/2021, 08/20/2021   Pertinent  Health Maintenance Due  Topic Date Due   INFLUENZA VACCINE  08/28/2023      11/24/2022   12:05 PM 11/26/2022    1:45 PM 12/24/2022   12:20 PM 01/14/2023    3:16 PM 02/26/2023    8:59 AM  Fall Risk  Falls in the  past year? 1 1 1 1  0  Was there an injury with Fall? 1 1 1 1    Fall Risk Category Calculator 3 2 2 2    Patient at Risk for Falls Due to History of fall(s);Impaired balance/gait;Impaired mobility History of fall(s);Impaired balance/gait;Impaired mobility History of fall(s);Impaired balance/gait;Impaired mobility History of fall(s);Impaired balance/gait;Impaired mobility   Fall risk Follow up Falls evaluation completed;Education provided;Falls prevention discussed Falls evaluation completed;Education provided;Falls prevention discussed Falls evaluation completed;Education provided;Falls prevention discussed Falls evaluation completed;Education provided;Falls prevention discussed    Functional Status Survey:    Vitals:   06/29/23 1456  BP: (!) 148/74  Pulse: 60  Resp: 19  Temp: (!) 97.5 F (36.4 C)  SpO2: 97%  Weight: 216 lb 3.2 oz (98.1 kg)  Height: 5\' 8"  (1.727 m)   Body mass index is 32.87 kg/m. Physical Exam Vitals reviewed.  Constitutional:      General: He is not in acute distress. HENT:     Head: Normocephalic.  Eyes:     General:         Right eye: No discharge.        Left eye: No discharge.  Cardiovascular:     Rate and Rhythm: Normal rate. Rhythm irregular.     Pulses: Normal pulses.     Heart sounds: Normal heart sounds.  Pulmonary:     Effort: Pulmonary effort is normal. No respiratory distress.     Breath sounds: Normal breath sounds. No wheezing or rales.  Abdominal:     General: Bowel sounds are normal.     Palpations: Abdomen is soft.  Musculoskeletal:     Cervical back: Neck supple.     Right lower leg: Edema present.     Left lower leg: Edema present.     Comments: BLE swelling L>R, non pitting, left leg with mild warmth, unable to determine Homan's sign, no apparent injury  Skin:    General: Skin is warm.     Capillary Refill: Capillary refill takes less than 2 seconds.     Comments: Approx 0.5 cm raised, fixed nodule to right posterior forearm, erythema, tenderness and scant white center present  Neurological:     General: No focal deficit present.     Mental Status: He is alert and oriented to person, place, and time.     Gait: Gait abnormal.  Psychiatric:        Mood and Affect: Mood normal.     Labs reviewed: Recent Labs    07/08/22 0237 07/09/22 0126 08/03/22 0252 08/04/22 0301 08/05/22 1610 08/06/22 0605 08/21/22 0247 08/22/22 0116 01/16/23 1240 02/12/23 1313 06/11/23 1251  NA 137   < > 144 142 138   < >  --    < > 141 139 143  K 4.1   < > 3.4* 4.5 4.1   < >  --    < > 4.1 4.0 3.5  CL 104   < > 114* 117* 109   < >  --    < > 106 105 111  CO2 22   < > 19* 20* 21*   < >  --    < > 27 26 24   GLUCOSE 104*   < > 124* 103* 97   < >  --    < > 84 94 89  BUN 29*   < > 32* 31* 33*   < >  --    < > 31* 36* 26*  CREATININE 1.58*   < >  1.58* 1.69* 1.68*   < >  --    < > 1.51* 1.57* 1.64*  CALCIUM  8.6*   < > 8.2* 7.9* 8.2*   < >  --    < > 9.7 10.1 8.6*  MG 1.7  --  1.4* 2.1 2.0  --  1.7  --   --   --   --   PHOS 2.1*  --  3.2  --   --   --   --   --   --   --   --    < > = values in this  interval not displayed.   Recent Labs    01/16/23 1240 02/12/23 1313 06/11/23 1251  AST 35 41 27  ALT 31 37 21  ALKPHOS 105 117 96  BILITOT 0.5 0.4 0.5  PROT 6.8 7.4 6.8  ALBUMIN 3.6 3.6 3.5   Recent Labs    03/19/23 1223 04/30/23 1138 06/11/23 1251  WBC 5.0 5.5 5.9  NEUTROABS 3.0 3.6 3.9  HGB 11.6* 10.2* 10.0*  HCT 35.2* 30.9* 29.7*  MCV 103.2* 102.7* 99.3  PLT 152 153 157   Lab Results  Component Value Date   TSH 0.14 (A) 01/19/2023   Lab Results  Component Value Date   HGBA1C 4.4 (L) 08/02/2022   No results found for: "CHOL", "HDL", "LDLCALC", "LDLDIRECT", "TRIG", "CHOLHDL"  Significant Diagnostic Results in last 30 days:  No results found.  Assessment/Plan 1. Localized swelling of left lower leg (Primary) - h/o atrial fibrillation> no DOAC due to GI bleed> unable to have watchman  - left leg about double size of right, mild warmth - concerns for DVT - ultrasound left lower extremity> doppler negative> nodules are noted to the groin area possibly lymph adenopathy, and there is concern for infection vs lymphedema   2. Chronic heart failure with preserved ejection fraction (HFpEF) (HCC) - LVEF 60-65% 07/07/2023 - some weight gain> now 216 lbs> was 202 04/28/2023 - non pitting edema present> see above - 03/2023 bumex  stopped due to declining kidney function - 04/15 furosemide  started M/W/F due to swelling - will increase furosemide /KCL to 4x/week - recheck bmp 06/12  3. Cellulitis of right arm - h/o boils - pea sized tender lesion to right posterior forearm, erythema and scan white center present, afebrile - patient requesting antibiotics before I/D or dermatology consult  - doxycycline (VIBRA-TABS) 100 MG tablet; Take 1 tablet (100 mg total) by mouth 2 (two) times daily for 7 days.    Family/ staff Communication: plan discussed with patient and nurse  Labs/tests ordered:  doppler left lower leg, bmp 06/12

## 2023-06-30 DIAGNOSIS — R600 Localized edema: Secondary | ICD-10-CM | POA: Diagnosis not present

## 2023-06-30 MED ORDER — POTASSIUM CHLORIDE CRYS ER 10 MEQ PO TBCR: 10.0000 meq | EXTENDED_RELEASE_TABLET | Freq: Every day | ORAL | Status: DC

## 2023-06-30 MED ORDER — FUROSEMIDE 20 MG PO TABS: 20.0000 mg | ORAL_TABLET | Freq: Every day | ORAL | Status: DC

## 2023-06-30 NOTE — Addendum Note (Signed)
 Addended byUlyses Gandy E on: 06/30/2023 04:19 PM   Modules accepted: Orders

## 2023-07-01 ENCOUNTER — Telehealth: Payer: Self-pay | Admitting: Adult Health

## 2023-07-01 ENCOUNTER — Non-Acute Institutional Stay: Payer: Self-pay | Admitting: Internal Medicine

## 2023-07-01 ENCOUNTER — Telehealth: Payer: Self-pay | Admitting: Student

## 2023-07-01 ENCOUNTER — Encounter: Payer: Self-pay | Admitting: Internal Medicine

## 2023-07-01 DIAGNOSIS — I5032 Chronic diastolic (congestive) heart failure: Secondary | ICD-10-CM | POA: Diagnosis not present

## 2023-07-01 DIAGNOSIS — R600 Localized edema: Secondary | ICD-10-CM | POA: Diagnosis not present

## 2023-07-01 DIAGNOSIS — N1832 Chronic kidney disease, stage 3b: Secondary | ICD-10-CM | POA: Diagnosis not present

## 2023-07-01 DIAGNOSIS — L03113 Cellulitis of right upper limb: Secondary | ICD-10-CM | POA: Diagnosis not present

## 2023-07-01 NOTE — Telephone Encounter (Signed)
 Nurse called to report venous doppler results to the left leg which indicated no DVT, nodules are noted to the groin area possibly lymph adenopathy, and there is concern for infection vs lymphedema Pt is on doxycycline Nurse reports no fever or spreading redness.  Pt is stable. Recommend continuing current therapy with provider f/u.

## 2023-07-01 NOTE — Telephone Encounter (Signed)
 Rc'd fax from [facility] for CPAP supplies. Will fwd to Ms. Cobb for signature.

## 2023-07-01 NOTE — Progress Notes (Unsigned)
 Location:  [facility] Room Number: AL31-A Place of Service:  [id number]) Provider:  Marguerite Shiley, MD  Patient Care Team: Marguerite Shiley, MD as PCP - General (Internal Medicine) Knox Perl, MD as PCP - Cardiology (Cardiology) Candyce Champagne, MD as Consulting Physician (General Surgery) Kenney Peacemaker, MD as Consulting Physician (Gastroenterology) Janita Mellow, MD as Consulting Physician (Otolaryngology) Knox Perl, MD as Consulting Physician (Cardiology) Reggy Capers as Physician Assistant (Dermatology) Sonja Saulsbury, MD as Consulting Physician (Oncology)  Extended Emergency Contact Information Primary Emergency Contact: Pilley,[name] Address: **call 2x [facility] no [facility]          [address]          [place], [place] [address] [place] Mobile Phone: [phone] Relation: Daughter Secondary Emergency Contact: Beaumier(POA),JR Jodeane Mulligan, [place] [address] [place] Home Phone: [phone] Mobile Phone: [phone] Relation: Son  Code Status:  DNR Goals of care: Advanced Directive information    07/01/2023   10:22 AM  Advanced Directives  Does Patient Have a Medical Advance Directive? Yes  Type of Estate agent of [place];Living will;Out of facility DNR (pink MOST or yellow form)  Does patient want to make changes to medical advance directive? No - Patient declined  Copy of Healthcare Power of Attorney in Chart? Yes - validated most recent copy scanned in chart (See row information)  Pre-existing out of facility DNR order (yellow form or pink MOST form) Yellow form placed in chart (order not valid for inpatient use)     Chief Complaint  Patient presents with   Edema    LE edema    HPI:  Pt is a 89 y.o. 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 No Residual disease per Last CT On Monitoring per Onology   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   Left Forehead (treatment after biopsy)   OSA on  CPAP    Perianal rash    Recurrent epistaxis    Renal cyst 01/04/2013   Small left peripelvic renal cysts , noted on US  Renal   SCCA (squamous cell carcinoma) of skin 10/17/2015   Left Sup Bridge of Nose (curet, cautery and 5FU)   Seasonal allergies    Superficial basal cell carcinoma (BCC) 10/17/2015   Left Bulb of Nose (curet, cautery and 5FU)   Tubular adenoma    Past Surgical History:  Procedure Laterality Date   BILIARY STENT PLACEMENT N/A 01/23/2022   Procedure: BILIARY STENT PLACEMENT;  Surgeon: Normie Becton., MD;  Location: [facility];  Service: Gastroenterology;  Laterality: N/A;   BILIARY STENT  PLACEMENT N/A 08/06/2022   Procedure: BILIARY STENT PLACEMENT;  Surgeon: Lajuan Pila, MD;  Location: [facility];  Service: Gastroenterology;  Laterality: N/A;   BIOPSY  01/23/2022   Procedure: BIOPSY;  Surgeon: Brice Campi Albino Alu., MD;  Location: [facility];  Service: Gastroenterology;;   BIOPSY  07/09/2022   Procedure: BIOPSY;  Surgeon: Annis Kinder, DO;  Location: [facility];  Service: Gastroenterology;;   BIOPSY  08/02/2022   Procedure: BIOPSY;  Surgeon: Albertina Hugger, MD;  Location: [facility];  Service: Gastroenterology;;   COLONOSCOPY  multiple   CYSTOSCOPY     ENDOSCOPIC RETROGRADE CHOLANGIOPANCREATOGRAPHY (ERCP) WITH PROPOFOL  N/A 01/23/2022   Procedure: ENDOSCOPIC RETROGRADE CHOLANGIOPANCREATOGRAPHY (ERCP) WITH PROPOFOL ;  Surgeon: Normie Becton., MD;  Location: [facility];  Service: Gastroenterology;  Laterality: N/A;   ENTEROSCOPY N/A 08/02/2022   Procedure: ENTEROSCOPY;  Surgeon: Albertina Hugger, MD;  Location: [facility];  Service: Gastroenterology;  Laterality: N/A;   ERCP N/A 08/06/2022   Procedure: ENDOSCOPIC RETROGRADE CHOLANGIOPANCREATOGRAPHY (ERCP);  Surgeon: Lajuan Pila, MD;  Location: [facility];  Service: Gastroenterology;  Laterality: N/A;   ESOPHAGOGASTRODUODENOSCOPY  multiple   ESOPHAGOGASTRODUODENOSCOPY N/A 01/23/2022   Procedure: ESOPHAGOGASTRODUODENOSCOPY (EGD);  Surgeon: Normie Becton., MD;  Location: [facility];  Service: Gastroenterology;  Laterality: N/A;   ESOPHAGOGASTRODUODENOSCOPY N/A 07/09/2022   Procedure: ESOPHAGOGASTRODUODENOSCOPY (EGD);  Surgeon: Annis Kinder, DO;  Location: [facility];  Service: Gastroenterology;  Laterality: N/A;   ESOPHAGOGASTRODUODENOSCOPY N/A 08/21/2022   Procedure: ESOPHAGOGASTRODUODENOSCOPY (EGD);  Surgeon: Elois Hair, MD;  Location: [facility];  Service: Gastroenterology;  Laterality: N/A;   EUS N/A 01/23/2022   Procedure: UPPER ENDOSCOPIC ULTRASOUND (EUS) RADIAL;   Surgeon: Normie Becton., MD;  Location: [facility];  Service: Gastroenterology;  Laterality: N/A;   EVALUATION UNDER ANESTHESIA WITH FISTULECTOMY N/A 03/02/2018   Procedure: ANORECTAL EXAM UNDER ANESTHESIA WITH REPAIR OF SUPERFICIAL PERIRECTAL FISTULA AND HEMORRHOIDECTOMY;  Surgeon: Candyce Champagne, MD;  Location: [facility];  Service: General;  Laterality: N/A;   EXPLORATORY LAPAROTOMY     HEMOSTASIS CLIP PLACEMENT  07/09/2022   Procedure: HEMOSTASIS CLIP PLACEMENT;  Surgeon: Annis Kinder, DO;  Location: [facility];  Service: Gastroenterology;;   HOT HEMOSTASIS N/A 08/02/2022   Procedure: HOT HEMOSTASIS (ARGON PLASMA COAGULATION/BICAP);  Surgeon: Albertina Hugger, MD;  Location: [facility];  Service: Gastroenterology;  Laterality: N/A;   HOT HEMOSTASIS N/A 08/21/2022   Procedure: HOT HEMOSTASIS (ARGON PLASMA COAGULATION/BICAP);  Surgeon: Elois Hair, MD;  Location[facility];  Service: Gastroenterology;  Laterality: N/A;   IR THORACENTESIS ASP PLEURAL SPACE W/IMG GUIDE  07/10/2022   REMOVAL OF STONES  01/23/2022   Procedure: REMOVAL OF STONES;  Surgeon: Normie Becton., MD;  Location: [facility];  Service: Gastroenterology;;   REMOVAL OF STONES  08/06/2022  Procedure: REMOVAL OF STONES;  Surgeon: Lajuan Pila, MD;  Location: [facility];  Service: Gastroenterology;;   [address]  01/23/2022   Procedure: [address];  Surgeon: Normie Becton., MD;  Location: [facility];  Service: Gastroenterology;;    Allergies  Allergen Reactions   Nsaids Other (See Comments)    Patient is to not take these because of kidney issues    Outpatient Encounter Medications as of 07/01/2023  Medication Sig   acetaminophen  (TYLENOL ) 325 MG tablet Take 650 mg by mouth every 4 (four) hours as needed for fever or moderate pain (pain score 4-6).   cetirizine (ZYRTEC) 10 MG tablet Take 10 mg by mouth daily as needed for allergies.   Cholecalciferol  (VITAMIN D3) 5000  units CAPS Take 5,000 Units by mouth every morning.   dapagliflozin  propanediol (FARXIGA ) 5 MG TABS tablet Take 1 tablet (5 mg total) by mouth in the morning.   doxycycline (VIBRA-TABS) 100 MG tablet Take 1 tablet (100 mg total) by mouth 2 (two) times daily for 7 days.   furosemide  (LASIX ) 20 MG tablet Take 1 tablet (20 mg total) by mouth daily. Give only on Monday, Tuesday, Wednesday and Friday. Give with KCL.   Glucosamine 500 MG CAPS Take 1 capsule by mouth every morning.   levothyroxine  (SYNTHROID ) 137 MCG tablet Take 1 tablet (137 mcg total) by mouth daily before breakfast.   Nutritional Supplements (BOOST PLUS PO) Take 1 Can by mouth once. 1 can orally one time a day for weight loss   nystatin  cream (MYCOSTATIN ) Apply 1 Application topically every 8 (eight) hours as needed (Yeast).   olopatadine  (PATANOL) 0.1 % ophthalmic solution Place 1 drop into both eyes daily as needed for allergies.   ondansetron  (ZOFRAN ) 8 MG tablet Take 1 tablet (8 mg total) by mouth every 8 (eight) hours as needed for nausea or vomiting.   pantoprazole  (PROTONIX ) 40 MG tablet Take 1 tablet (40 mg total) by mouth 2 (two) times daily.   potassium chloride  (KLOR-CON  M) 10 MEQ tablet Take 1 tablet (10 mEq total) by mouth daily. Give every Monday, Tuesday, Wednesday, Friday. Give with furosemide .   sodium chloride  (OCEAN) 0.65 % SOLN nasal spray Place 1 spray into both nostrils every 12 (twelve) hours as needed for congestion.   sucralfate  (CARAFATE ) 1 g tablet Take 1 g by mouth 4 (four) times daily -  with meals and at bedtime.   Zinc Oxide 10 % OINT Apply 1 Application topically as needed.   No facility-administered encounter medications on file as of 07/01/2023.    Review of Systems  Constitutional:  Negative for activity change, appetite change and unexpected weight change.  HENT: Negative.    Respiratory:  Negative for cough and shortness of breath.   Cardiovascular:  Positive for leg swelling.  Gastrointestinal:   Negative for constipation.  Genitourinary:  Negative for frequency.  Musculoskeletal:  Positive for gait problem. Negative for arthralgias and myalgias.  Skin: Negative.  Negative for rash.  Neurological:  Negative for dizziness and weakness.  Psychiatric/Behavioral:  Negative for confusion and sleep disturbance.   All other systems reviewed and are negative.   Immunization History  Administered Date(s) Administered   Fluad Quad(high Dose 65+) 11/26/2022   Influenza, Quadrivalent, Recombinant, Inj, Pf 01/08/2017, 10/04/2020   Moderna Covid-19 Vaccine Bivalent Booster 51yrs & up 12/03/2022   PFIZER Comirnaty(Gray Top)Covid-19 Tri-Sucrose Vaccine 03/05/2019, 03/28/2019   PPD Test 08/30/2022, 09/13/2022   Pfizer Covid-19 Vaccine Bivalent Booster 76yrs & up 01/12/2020   Pneumococcal Polysaccharide-23  06/22/2018   Zoster Recombinant(Shingrix) 04/18/2021, 08/20/2021   Pertinent  Health Maintenance Due  Topic Date Due   INFLUENZA VACCINE  08/28/2023      11/26/2022    1:45 PM 12/24/2022   12:20 PM 01/14/2023    3:16 PM 02/26/2023    8:59 AM 06/29/2023    3:20 PM  Fall Risk  Falls in the past year? 1 1 1  0 0  Was there an injury with Fall? 1 1 1   0  Fall Risk Category Calculator 2 2 2   0  Patient at Risk for Falls Due to History of fall(s);Impaired balance/gait;Impaired mobility History of fall(s);Impaired balance/gait;Impaired mobility History of fall(s);Impaired balance/gait;Impaired mobility  History of fall(s)  Fall risk Follow up Falls evaluation completed;Education provided;Falls prevention discussed Falls evaluation completed;Education provided;Falls prevention discussed Falls evaluation completed;Education provided;Falls prevention discussed  Falls evaluation completed;Education provided   Functional Status Survey:    Vitals:   07/01/23 1014  BP: (!) 148/78  Pulse: 74  Resp: 19  Temp: (!) 97.5 F (36.4 C)  SpO2: 97%  Weight: 216 lb 3.2 oz (98.1 kg)  Height: 5\' 8"   (1.727 m)   Body mass index is 32.87 kg/m. Physical Exam Vitals reviewed.  Constitutional:      Appearance: Normal appearance.  HENT:     Head: Normocephalic.     Nose: Nose normal.     Mouth/Throat:     Mouth: Mucous membranes are moist.     Pharynx: Oropharynx is clear.  Eyes:     Pupils: Pupils are equal, round, and reactive to light.  Cardiovascular:     Rate and Rhythm: Normal rate.     Pulses: Normal pulses.  Pulmonary:     Effort: Pulmonary effort is normal.     Breath sounds: Normal breath sounds.  Abdominal:     General: Abdomen is flat. Bowel sounds are normal.     Palpations: Abdomen is soft.  Musculoskeletal:        General: Swelling present.     Cervical back: Neck supple.  Skin:    General: Skin is warm.  Neurological:     General: No focal deficit present.     Mental Status: He is alert.  Psychiatric:        Mood and Affect: Mood normal.        Behavior: Behavior normal.     Labs reviewed: Recent Labs    07/08/22 0237 07/09/22 0126 08/03/22 0252 08/04/22 0301 08/05/22 4098 08/06/22 0605 08/21/22 0247 08/22/22 0116 01/16/23 1240 02/12/23 1313 06/11/23 1251  NA 137   < > 144 142 138   < >  --    < > 141 139 143  K 4.1   < > 3.4* 4.5 4.1   < >  --    < > 4.1 4.0 3.5  CL 104   < > 114* 117* 109   < >  --    < > 106 105 111  CO2 22   < > 19* 20* 21*   < >  --    < > 27 26 24   GLUCOSE 104*   < > 124* 103* 97   < >  --    < > 84 94 89  BUN 29*   < > 32* 31* 33*   < >  --    < > 31* 36* 26*  CREATININE 1.58*   < > 1.58* 1.69* 1.68*   < >  --    < >  1.51* 1.57* 1.64*  CALCIUM  8.6*   < > 8.2* 7.9* 8.2*   < >  --    < > 9.7 10.1 8.6*  MG 1.7  --  1.4* 2.1 2.0  --  1.7  --   --   --   --   PHOS 2.1*  --  3.2  --   --   --   --   --   --   --   --    < > = values in this interval not displayed.   Recent Labs    01/16/23 1240 02/12/23 1313 06/11/23 1251  AST 35 41 27  ALT 31 37 21  ALKPHOS 105 117 96  BILITOT 0.5 0.4 0.5  PROT 6.8 7.4 6.8   ALBUMIN 3.6 3.6 3.5   Recent Labs    03/19/23 1223 04/30/23 1138 06/11/23 1251  WBC 5.0 5.5 5.9  NEUTROABS 3.0 3.6 3.9  HGB 11.6* 10.2* 10.0*  HCT 35.2* 30.9* 29.7*  MCV 103.2* 102.7* 99.3  PLT 152 153 157   Lab Results  Component Value Date   TSH 0.14 (A) 01/19/2023   Lab Results  Component Value Date   HGBA1C 4.4 (L) 08/02/2022   No results found for: "CHOL", "HDL", "LDLCALC", "LDLDIRECT", "TRIG", "CHOLHDL"  Significant Diagnostic Results in last 30 days:  No results found.  Assessment/Plan 1. Lower extremity edema (Primary) Will change his Lasix  to 20 mg every day Reval in 1 week with Renal function Dopplers negative  2. Chronic heart failure with preserved ejection fraction (HFpEF) (HCC) On Farxiga   Added Lasix   3. Cellulitis of right arm Redness resolved Still has a small boil there  4. Stage 3b chronic kidney disease (HCC) Repeat creat pending    Family/ staff Communication:   Labs/tests ordered:

## 2023-07-03 DIAGNOSIS — R41841 Cognitive communication deficit: Secondary | ICD-10-CM | POA: Diagnosis not present

## 2023-07-06 NOTE — Telephone Encounter (Signed)
 CMN signed and faxed successfully and sent to scan.

## 2023-07-09 DIAGNOSIS — D649 Anemia, unspecified: Secondary | ICD-10-CM | POA: Diagnosis not present

## 2023-07-09 DIAGNOSIS — I10 Essential (primary) hypertension: Secondary | ICD-10-CM | POA: Diagnosis not present

## 2023-07-09 LAB — BASIC METABOLIC PANEL WITH GFR
BUN: 26 — AB (ref 4–21)
CO2: 23 — AB (ref 13–22)
Chloride: 109 — AB (ref 99–108)
Creatinine: 1.5 — AB (ref 0.6–1.3)
Glucose: 88
Potassium: 3.5 meq/L (ref 3.5–5.1)
Sodium: 141 (ref 137–147)

## 2023-07-09 LAB — CBC: RBC: 2.82 — AB (ref 3.87–5.11)

## 2023-07-09 LAB — CBC AND DIFFERENTIAL
HCT: 29 — AB (ref 41–53)
Hemoglobin: 9.1 — AB (ref 13.5–17.5)
Neutrophils Absolute: 3228
Platelets: 90 10*3/uL — AB (ref 150–400)
WBC: 5.1

## 2023-07-09 LAB — COMPREHENSIVE METABOLIC PANEL WITH GFR
Calcium: 8.2 — AB (ref 8.7–10.7)
eGFR: 43

## 2023-07-20 ENCOUNTER — Encounter: Payer: Self-pay | Admitting: Orthopedic Surgery

## 2023-07-20 ENCOUNTER — Non-Acute Institutional Stay: Payer: Self-pay | Admitting: Orthopedic Surgery

## 2023-07-20 DIAGNOSIS — I5032 Chronic diastolic (congestive) heart failure: Secondary | ICD-10-CM

## 2023-07-20 DIAGNOSIS — Q2730 Arteriovenous malformation, site unspecified: Secondary | ICD-10-CM | POA: Diagnosis not present

## 2023-07-20 DIAGNOSIS — C241 Malignant neoplasm of ampulla of Vater: Secondary | ICD-10-CM

## 2023-07-20 DIAGNOSIS — Z8719 Personal history of other diseases of the digestive system: Secondary | ICD-10-CM | POA: Diagnosis not present

## 2023-07-20 DIAGNOSIS — N1832 Chronic kidney disease, stage 3b: Secondary | ICD-10-CM | POA: Diagnosis not present

## 2023-07-20 DIAGNOSIS — G4733 Obstructive sleep apnea (adult) (pediatric): Secondary | ICD-10-CM | POA: Diagnosis not present

## 2023-07-20 DIAGNOSIS — I10 Essential (primary) hypertension: Secondary | ICD-10-CM | POA: Diagnosis not present

## 2023-07-20 DIAGNOSIS — R600 Localized edema: Secondary | ICD-10-CM

## 2023-07-20 DIAGNOSIS — I4821 Permanent atrial fibrillation: Secondary | ICD-10-CM

## 2023-07-20 DIAGNOSIS — L02429 Furuncle of limb, unspecified: Secondary | ICD-10-CM

## 2023-07-20 DIAGNOSIS — E039 Hypothyroidism, unspecified: Secondary | ICD-10-CM

## 2023-07-20 NOTE — Progress Notes (Signed)
 Location:  [facility] Room Number: AL31-A Place of Service:  ALF [phone]) Provider:  Gil Greig FORBES CARROLYN Charlanne Fredia LITTIE, MD  Patient Care Team: Charlanne Fredia LITTIE, MD as PCP - General (Internal Medicine) Ladona Heinz, MD as PCP - Cardiology (Cardiology) Sheldon Standing, MD as Consulting Physician (General Surgery) Avram Lupita FORBES, MD as Consulting Physician (Gastroenterology) Jesus Oliphant, MD as Consulting Physician (Otolaryngology) Ladona Heinz, MD as Consulting Physician (Cardiology) Porter Andrez JONELLE DEVONNA as Physician Assistant (Dermatology) Lanny Callander, MD as Consulting Physician (Oncology)  Extended Emergency Contact Information Primary Emergency Contact: Labree,[name] Address: **call 2x [facility] no [facility]          [address]          [place], [place] [address] [place] Mobile Phone: [phone] Relation: Daughter Secondary Emergency Contact: TRISTAN MICKEY [place], [place] [address] [place] Home Phone: [phone] Mobile Phone: [phone] Relation: Son  Code Status:  DNR Goals of care: Advanced Directive information    07/20/2023   11:06 AM  Advanced Directives  Does Patient Have a Medical Advance Directive? Yes  Type of Estate agent of [place];Living will;Out of facility DNR (pink MOST or yellow form)  Does patient want to make changes to medical advance directive? No - Patient declined  Copy of Healthcare Power of Attorney in Chart? Yes - validated most recent copy scanned in chart (See row information)  Pre-existing out of facility DNR order (yellow form or pink MOST form) Yellow form placed in chart (order not valid for inpatient use)     Chief Complaint  Patient presents with   Medical Management of Chronic Issues    Routine Visit    HPI:  Pt is a 89 y.o. male seen today for medical management of chronic diseases.    He currently resides on the assisted living unit at [facility]. 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, recent PET scan no metastasis, RT finished 03/2022, no plans for chemo due to advanced age AVM- followed by oncology, Hgb 9.1 (07/09/2023), given bevacizumab  every 4 weeks Atrial fib- followed by cardiology, HR<100 without medication, not on anticoagulation due to previous GI bleeds HTN- BUN/creat 26/1.5 07/09/2023, not on medication Hypothyroidism- TSH 0.45 03/05/2023, remains on levothyroxine  CKD- GFR 43 (06/12)> was 39 (05/15) H/o GI bleed- remains on sucralfate  and pantoprazole  OSA- 12/2022 home sleep study> severe sleep apnea Right arm boil- improved pain after taking doxycycline  x 7 days, boil remains> requesting in house dermatology appointment  Recent weights:  06/23 202.8 lbs  06/18- 216.2 lbs  05/01- 214.2 lbs  04/01- 202.2 lbs  Recent blood pressures:  06/18- 142/74  06/11- 138/67  06/04- 146/72    Past Medical History:  Diagnosis Date   Anal fistula    Arthritis    Fingers and hands   Atrial fibrillation (HCC)    AVM (arteriovenous malformation)    Clipped during Colonoscopy 05/2016   Barrett's esophagus  Bilateral cataracts    BPH (benign prostatic hyperplasia)    Cecal angiodysplasia 05/28/2016   ablated at colonoscopy   Deviated nasal septum    Diverticulosis of sigmoid colon    E. coli infection    Fatty liver    GERD (gastroesophageal reflux disease)    History of colon polyps    Hypothyroidism    Iron deficiency anemia     Nodular basal cell carcinoma (BCC) 11/05/2017   Left Forehead (treatment after biopsy)   OSA on CPAP    Perianal rash    Recurrent epistaxis    Renal cyst 01/04/2013   Small left peripelvic renal cysts , noted on US  Renal   SCCA (squamous cell carcinoma) of skin 10/17/2015   Left Sup Bridge of Nose (curet, cautery and 5FU)   Seasonal allergies    Superficial basal cell carcinoma (BCC) 10/17/2015   Left Bulb of Nose (curet, cautery and 5FU)   Tubular adenoma    Past Surgical History:  Procedure Laterality Date   BILIARY STENT PLACEMENT N/A 01/23/2022   Procedure: BILIARY STENT PLACEMENT;  Surgeon: Wilhelmenia Aloha Raddle., MD;  Location: [facility];  Service: Gastroenterology;  Laterality: N/A;   BILIARY STENT PLACEMENT N/A 08/06/2022   Procedure: BILIARY STENT PLACEMENT;  Surgeon: Charlanne Groom, MD;  Location: [facility];  Service: Gastroenterology;  Laterality: N/A;   BIOPSY  01/23/2022   Procedure: BIOPSY;  Surgeon: Wilhelmenia Aloha Raddle., MD;  Location: [facility];  Service: Gastroenterology;;   BIOPSY  07/09/2022   Procedure: BIOPSY;  Surgeon: San Sandor GAILS, DO;  Location: [facility];  Service: Gastroenterology;;   BIOPSY  08/02/2022   Procedure: BIOPSY;  Surgeon: Legrand Victory LITTIE DOUGLAS, MD;  Location: [facility];  Service: Gastroenterology;;   COLONOSCOPY  multiple   CYSTOSCOPY     ENDOSCOPIC RETROGRADE CHOLANGIOPANCREATOGRAPHY (ERCP) WITH PROPOFOL  N/A 01/23/2022   Procedure: ENDOSCOPIC RETROGRADE CHOLANGIOPANCREATOGRAPHY (ERCP) WITH PROPOFOL ;  Surgeon: Wilhelmenia Aloha Raddle., MD;  Location: [facility];  Service: Gastroenterology;  Laterality: N/A;   ENTEROSCOPY N/A 08/02/2022   Procedure: ENTEROSCOPY;  Surgeon: Legrand Victory LITTIE DOUGLAS, MD;  Location: [facility];  Service: Gastroenterology;  Laterality: N/A;   ERCP N/A 08/06/2022   Procedure: ENDOSCOPIC RETROGRADE CHOLANGIOPANCREATOGRAPHY (ERCP);  Surgeon: Charlanne Groom, MD;  Location: [facility];  Service:  Gastroenterology;  Laterality: N/A;   ESOPHAGOGASTRODUODENOSCOPY  multiple   ESOPHAGOGASTRODUODENOSCOPY N/A 01/23/2022   Procedure: ESOPHAGOGASTRODUODENOSCOPY (EGD);  Surgeon: Wilhelmenia Aloha Raddle., MD;  Location: [facility];  Service: Gastroenterology;  Laterality: N/A;   ESOPHAGOGASTRODUODENOSCOPY N/A 07/09/2022   Procedure: ESOPHAGOGASTRODUODENOSCOPY (EGD);  Surgeon: San Sandor GAILS, DO;  Location[facility];  Service: Gastroenterology;  Laterality: N/A;   ESOPHAGOGASTRODUODENOSCOPY N/A 08/21/2022   Procedure: ESOPHAGOGASTRODUODENOSCOPY (EGD);  Surgeon: Stacia Glendia BRAVO, MD;  Location: [facility]COPY;  Service: Gastroenterology;  Laterality: N/A;   EUS N/A 01/23/2022   Procedure: UPPER ENDOSCOPIC ULTRASOUND (EUS) RADIAL;  Surgeon: Wilhelmenia Aloha Raddle., MD;  Location: [facility];  Service: Gastroenterology;  Laterality: N/A;   EVALUATION UNDER ANESTHESIA WITH FISTULECTOMY N/A 03/02/2018   Procedure: ANORECTAL EXAM UNDER ANESTHESIA WITH REPAIR OF SUPERFICIAL PERIRECTAL FISTULA AND HEMORRHOIDECTOMY;  Surgeon: Sheldon Standing, MD;  Location: [facility];  Service: General;  Laterality: N/A;   EXPLORATORY LAPAROTOMY     HEMOSTASIS CLIP PLACEMENT  07/09/2022   Procedure: HEMOSTASIS CLIP PLACEMENT;  Surgeon: San Sandor GAILS, DO;  Location: [facility];  Service: Gastroenterology;;   HOT HEMOSTASIS N/A 08/02/2022   Procedure: HOT HEMOSTASIS (ARGON PLASMA COAGULATION/BICAP);  Surgeon: Legrand Victory LITTIE DOUGLAS,  MD;  Location: [facility];  Service: Gastroenterology;  Laterality: N/A;   HOT HEMOSTASIS N/A 08/21/2022   Procedure: HOT HEMOSTASIS (ARGON PLASMA COAGULATION/BICAP);  Surgeon: Stacia Glendia BRAVO, MD;  Location: [facility];  Service: Gastroenterology;  Laterality: N/A;   IR THORACENTESIS ASP PLEURAL SPACE W/IMG GUIDE  07/10/2022   REMOVAL OF STONES  01/23/2022   Procedure: REMOVAL OF STONES;  Surgeon: Wilhelmenia Aloha Raddle., MD;  Location: [facility];  Service: Gastroenterology;;    REMOVAL OF STONES  08/06/2022   Procedure: REMOVAL OF STONES;  Surgeon: Charlanne Groom, MD;  Location: [facility];  Service: Gastroenterology;;   ANNETT  01/23/2022   Procedure: ANNETT;  Surgeon: Wilhelmenia Aloha Raddle., MD;  Location: [facility];  Service: Gastroenterology;;    Allergies  Allergen Reactions   Nsaids Other (See Comments)    Patient is to not take these because of kidney issues    Outpatient Encounter Medications as of 07/20/2023  Medication Sig   acetaminophen  (TYLENOL ) 325 MG tablet Take 650 mg by mouth every 4 (four) hours as needed for fever or moderate pain (pain score 4-6).   cetirizine (ZYRTEC) 10 MG tablet Take 10 mg by mouth daily as needed for allergies.   Cholecalciferol  (VITAMIN D3) 5000 units CAPS Take 5,000 Units by mouth every morning.   dapagliflozin  propanediol (FARXIGA ) 5 MG TABS tablet Take 1 tablet (5 mg total) by mouth in the morning.   furosemide  (LASIX ) 20 MG tablet Take 20 mg by mouth daily.   Glucosamine 500 MG CAPS Take 1 capsule by mouth every morning.   levothyroxine  (SYNTHROID ) 137 MCG tablet Take 1 tablet (137 mcg total) by mouth daily before breakfast.   Nutritional Supplements (BOOST PLUS PO) Take 1 Can by mouth once. 1 can orally one time a day for weight loss   nystatin  cream (MYCOSTATIN ) Apply 1 Application topically every 8 (eight) hours as needed (Yeast).   olopatadine  (PATANOL) 0.1 % ophthalmic solution Place 1 drop into both eyes daily as needed for allergies.   ondansetron  (ZOFRAN ) 8 MG tablet Take 1 tablet (8 mg total) by mouth every 8 (eight) hours as needed for nausea or vomiting.   pantoprazole  (PROTONIX ) 40 MG tablet Take 1 tablet (40 mg total) by mouth 2 (two) times daily.   potassium chloride  (KLOR-CON ) 10 MEQ tablet Take 10 mEq by mouth daily.   sodium chloride  (OCEAN) 0.65 % SOLN nasal spray Place 1 spray into both nostrils every 12 (twelve) hours as needed for congestion.   sucralfate  (CARAFATE ) 1 g tablet  Take 1 g by mouth 4 (four) times daily.   Zinc Oxide 10 % OINT Apply 1 Application topically as needed.   sucralfate  (CARAFATE ) 1 g tablet Take 1 g by mouth 4 (four) times daily -  with meals and at bedtime. (Patient not taking: Reported on 07/20/2023)   No facility-administered encounter medications on file as of 07/20/2023.    Review of Systems  Constitutional:  Negative for fatigue and fever.  HENT:  Negative for sore throat and trouble swallowing.   Eyes:  Negative for visual disturbance.  Respiratory:  Negative for cough and shortness of breath.   Cardiovascular:  Positive for leg swelling. Negative for chest pain.  Gastrointestinal:  Negative for abdominal distention, abdominal pain, blood in stool, constipation, diarrhea, nausea and vomiting.  Genitourinary:  Positive for frequency. Negative for dysuria and hematuria.  Musculoskeletal:  Positive for gait problem.  Skin:  Positive for wound.  Neurological:  Positive for weakness. Negative for dizziness  and light-headedness.  Psychiatric/Behavioral:  Negative for confusion and dysphoric mood. The patient is not nervous/anxious.     Immunization History  Administered Date(s) Administered   Fluad Quad(high Dose 65+) 11/26/2022   Influenza, Quadrivalent, Recombinant, Inj, Pf 01/08/2017, 10/04/2020   Moderna Covid-19 Vaccine Bivalent Booster 29yrs & up 12/03/2022   PFIZER Comirnaty(Gray Top)Covid-19 Tri-Sucrose Vaccine 03/05/2019, 03/28/2019   PPD Test 08/30/2022, 09/13/2022   Pfizer Covid-19 Vaccine Bivalent Booster 31yrs & up 01/12/2020   Pneumococcal Polysaccharide-23 06/22/2018   Zoster Recombinant(Shingrix) 04/18/2021, 08/20/2021   Pertinent  Health Maintenance Due  Topic Date Due   INFLUENZA VACCINE  08/28/2023      11/26/2022    1:45 PM 12/24/2022   12:20 PM 01/14/2023    3:16 PM 02/26/2023    8:59 AM 06/29/2023    3:20 PM  Fall Risk  Falls in the past year? 1 1 1  0 0  Was there an injury with Fall? 1 1 1   0  Fall  Risk Category Calculator 2 2 2   0  Patient at Risk for Falls Due to History of fall(s);Impaired balance/gait;Impaired mobility History of fall(s);Impaired balance/gait;Impaired mobility History of fall(s);Impaired balance/gait;Impaired mobility  History of fall(s)  Fall risk Follow up Falls evaluation completed;Education provided;Falls prevention discussed Falls evaluation completed;Education provided;Falls prevention discussed Falls evaluation completed;Education provided;Falls prevention discussed  Falls evaluation completed;Education provided   Functional Status Survey:    Vitals:   07/20/23 1103 07/20/23 1104  BP: (!) 142/74 138/67  Pulse: 69   Resp: 20   Temp: 97.9 F (36.6 C)   SpO2: 96%   Weight: 216 lb 3.2 oz (98.1 kg)   Height: 5' 8 (1.727 m)    Body mass index is 32.87 kg/m. Physical Exam Vitals reviewed.  Constitutional:      General: He is not in acute distress. HENT:     Head: Normocephalic.     Right Ear: There is no impacted cerumen.     Left Ear: There is no impacted cerumen.     Nose: Nose normal.     Mouth/Throat:     Mouth: Mucous membranes are moist.   Eyes:     General:        Right eye: No discharge.        Left eye: No discharge.    Cardiovascular:     Rate and Rhythm: Normal rate. Rhythm irregular.     Pulses: Normal pulses.     Heart sounds: Normal heart sounds.  Pulmonary:     Effort: Pulmonary effort is normal.     Breath sounds: Normal breath sounds.  Abdominal:     General: There is no distension.     Palpations: Abdomen is soft.     Tenderness: There is no abdominal tenderness.   Musculoskeletal:     Cervical back: Neck supple.     Right lower leg: Edema present.     Left lower leg: Edema present.     Comments: L>R, 1+ pitting   Skin:    General: Skin is warm.     Capillary Refill: Capillary refill takes less than 2 seconds.     Comments: Approx 0.5 cm raised boil to right forearm, white center, rubbery feel, nontender, no skin  breakdown, surrounding skin intact   Neurological:     General: No focal deficit present.     Mental Status: He is alert and oriented to person, place, and time.     Motor: Weakness present.     Gait: Gait  abnormal.   Psychiatric:        Mood and Affect: Mood normal.     Labs reviewed: Recent Labs    08/03/22 0252 08/04/22 0301 08/05/22 0614 08/06/22 0605 08/21/22 0247 08/22/22 0116 01/16/23 1240 02/12/23 1313 06/11/23 1251 07/09/23 0000  NA 144 142 138   < >  --    < > 141 139 143 141  K 3.4* 4.5 4.1   < >  --    < > 4.1 4.0 3.5 3.5  CL 114* 117* 109   < >  --    < > 106 105 111 109*  CO2 19* 20* 21*   < >  --    < > 27 26 24  23*  GLUCOSE 124* 103* 97   < >  --    < > 84 94 89  --   BUN 32* 31* 33*   < >  --    < > 31* 36* 26* 26*  CREATININE 1.58* 1.69* 1.68*   < >  --    < > 1.51* 1.57* 1.64* 1.5*  CALCIUM  8.2* 7.9* 8.2*   < >  --    < > 9.7 10.1 8.6* 8.2*  MG 1.4* 2.1 2.0  --  1.7  --   --   --   --   --   PHOS 3.2  --   --   --   --   --   --   --   --   --    < > = values in this interval not displayed.   Recent Labs    01/16/23 1240 02/12/23 1313 06/11/23 1251  AST 35 41 27  ALT 31 37 21  ALKPHOS 105 117 96  BILITOT 0.5 0.4 0.5  PROT 6.8 7.4 6.8  ALBUMIN 3.6 3.6 3.5   Recent Labs    03/19/23 1223 04/30/23 1138 06/11/23 1251 07/09/23 0000  WBC 5.0 5.5 5.9 5.1  NEUTROABS 3.0 3.6 3.9 3,228.00  HGB 11.6* 10.2* 10.0* 9.1*  HCT 35.2* 30.9* 29.7* 29*  MCV 103.2* 102.7* 99.3  --   PLT 152 153 157 90*   Lab Results  Component Value Date   TSH 0.14 (A) 01/19/2023   Lab Results  Component Value Date   HGBA1C 4.4 (L) 08/02/2022   No results found for: CHOL, HDL, LDLCALC, LDLDIRECT, TRIG, CHOLHDL  Significant Diagnostic Results in last 30 days:  No results found.  Assessment/Plan 1. Bilateral lower extremity edema (Primary) - improved - recent doppler negative for DVT, lymphedema present, inguinal lymph nodes noted - L>R, 1+  pitting - 06/04 started on furosemide  20 mg daily   2. Chronic heart failure with preserved ejection fraction (HFpEF) (HCC) - 03/2023 Bumex  stopped  - weigh gain approx 14 lbs within 2 months, increased lower leg edema - see above - cont furosemide  daily   3. Cancer of ampulla of Vater [facility]) - followed by oncology  4. AVM (arteriovenous malformation) - cont Bevacizumab   5. Permanent atrial fibrillation (HCC) - HR< 100 without medication - not on DOAC due to past GI bleed  6. Essential hypertension - controlled   7. Hypothyroidism, unspecified type - TSH improved - cont levothyroxine   8. Stage 3b chronic kidney disease (HCC) - encourage hydration with water - avoid NSAIDS  9. History of GI bleed - hgb stable  - cont bimonthly checks per oncology  10. OSA (obstructive sleep apnea) - cont CPAP  11. Boil of upper arm  and forearm - completed doxycycline  x 7 days - pain improved but boil remains  - scheduled with in house dermatology     Family/ staff Communication: plan discussed with   Labs/tests ordered:  none

## 2023-07-21 DIAGNOSIS — G4733 Obstructive sleep apnea (adult) (pediatric): Secondary | ICD-10-CM | POA: Diagnosis not present

## 2023-07-23 ENCOUNTER — Inpatient Hospital Stay: Admitting: Hematology

## 2023-07-23 ENCOUNTER — Inpatient Hospital Stay

## 2023-07-23 ENCOUNTER — Encounter: Payer: Self-pay | Admitting: Hematology

## 2023-07-23 ENCOUNTER — Inpatient Hospital Stay: Attending: Physician Assistant

## 2023-07-23 VITALS — BP 120/62 | HR 71 | Temp 97.2°F | Resp 15 | Ht 68.0 in | Wt 205.2 lb

## 2023-07-23 DIAGNOSIS — K5521 Angiodysplasia of colon with hemorrhage: Secondary | ICD-10-CM

## 2023-07-23 DIAGNOSIS — Z5112 Encounter for antineoplastic immunotherapy: Secondary | ICD-10-CM | POA: Diagnosis not present

## 2023-07-23 DIAGNOSIS — D500 Iron deficiency anemia secondary to blood loss (chronic): Secondary | ICD-10-CM | POA: Diagnosis not present

## 2023-07-23 DIAGNOSIS — D519 Vitamin B12 deficiency anemia, unspecified: Secondary | ICD-10-CM

## 2023-07-23 DIAGNOSIS — C241 Malignant neoplasm of ampulla of Vater: Secondary | ICD-10-CM | POA: Diagnosis not present

## 2023-07-23 LAB — CBC WITH DIFFERENTIAL (CANCER CENTER ONLY)
Abs Immature Granulocytes: 0.02 10*3/uL (ref 0.00–0.07)
Basophils Absolute: 0 10*3/uL (ref 0.0–0.1)
Basophils Relative: 1 %
Eosinophils Absolute: 0.2 10*3/uL (ref 0.0–0.5)
Eosinophils Relative: 3 %
HCT: 31 % — ABNORMAL LOW (ref 39.0–52.0)
Hemoglobin: 10.1 g/dL — ABNORMAL LOW (ref 13.0–17.0)
Immature Granulocytes: 0 %
Lymphocytes Relative: 21 %
Lymphs Abs: 1.1 10*3/uL (ref 0.7–4.0)
MCH: 32.2 pg (ref 26.0–34.0)
MCHC: 32.6 g/dL (ref 30.0–36.0)
MCV: 98.7 fL (ref 80.0–100.0)
Monocytes Absolute: 0.6 10*3/uL (ref 0.1–1.0)
Monocytes Relative: 10 %
Neutro Abs: 3.6 10*3/uL (ref 1.7–7.7)
Neutrophils Relative %: 65 %
Platelet Count: 143 10*3/uL — ABNORMAL LOW (ref 150–400)
RBC: 3.14 MIL/uL — ABNORMAL LOW (ref 4.22–5.81)
RDW: 19.6 % — ABNORMAL HIGH (ref 11.5–15.5)
WBC Count: 5.5 10*3/uL (ref 4.0–10.5)
nRBC: 0 % (ref 0.0–0.2)

## 2023-07-23 LAB — TOTAL PROTEIN, URINE DIPSTICK: Protein, ur: 30 mg/dL — AB

## 2023-07-23 MED ORDER — SODIUM CHLORIDE 0.9 % IV SOLN: Freq: Once | INTRAVENOUS | Status: AC

## 2023-07-23 MED ORDER — CYANOCOBALAMIN 1000 MCG/ML IJ SOLN
1000.0000 ug | Freq: Once | INTRAMUSCULAR | Status: AC
  Administered 2023-07-23: 1000 ug via INTRAMUSCULAR
  Filled 2023-07-23: qty 1

## 2023-07-23 MED ORDER — SODIUM CHLORIDE 0.9 % IV SOLN
5.0000 mg/kg | Freq: Once | INTRAVENOUS | Status: AC
  Administered 2023-07-23: 400 mg via INTRAVENOUS
  Filled 2023-07-23: qty 16

## 2023-07-23 NOTE — Patient Instructions (Signed)
 [facility] - A DEPT OF [facility]  Discharge Instructions: Thank you for choosing [facility] to provide your oncology and hematology care.   If you have a lab appointment with the Cancer Center, please go directly to the Cancer Center and check in at the registration area.   Wear comfortable clothing and clothing appropriate for easy access to any Portacath or PICC line.   We strive to give you quality time with your provider. You may need to reschedule your appointment if you arrive late (15 or more minutes).  Arriving late affects you and other patients whose appointments are after yours.  Also, if you miss three or more appointments without notifying the office, you may be dismissed from the clinic at the provider's discretion.      For prescription refill requests, have your pharmacy contact our office and allow 72 hours for refills to be completed.    Today you received the following chemotherapy and/or immunotherapy agents: Vegzelma      To help prevent nausea and vomiting after your treatment, we encourage you to take your nausea medication as directed.  BELOW ARE SYMPTOMS THAT SHOULD BE REPORTED IMMEDIATELY: *FEVER GREATER THAN 100.4 F (38 C) OR HIGHER *CHILLS OR SWEATING *NAUSEA AND VOMITING THAT IS NOT CONTROLLED WITH YOUR NAUSEA MEDICATION *UNUSUAL SHORTNESS OF BREATH *UNUSUAL BRUISING OR BLEEDING *URINARY PROBLEMS (pain or burning when urinating, or frequent urination) *BOWEL PROBLEMS (unusual diarrhea, constipation, pain near the anus) TENDERNESS IN MOUTH AND THROAT WITH OR WITHOUT PRESENCE OF ULCERS (sore throat, sores in mouth, or a toothache) UNUSUAL RASH, SWELLING OR PAIN  UNUSUAL VAGINAL DISCHARGE OR ITCHING   Items with * indicate a potential emergency and should be followed up as soon as possible or go to the Emergency Department if any problems should occur.  Please show the CHEMOTHERAPY ALERT CARD or IMMUNOTHERAPY  ALERT CARD at check-in to the Emergency Department and triage nurse.  Should you have questions after your visit or need to cancel or reschedule your appointment, please contact [facility] - A DEPT OF [facility]  Dept: [phone]  and follow the prompts.  Office hours are 8:00 a.m. to 4:30 p.m. Monday - Friday. Please note that voicemails left after 4:00 p.m. may not be returned until the following business day.  We are closed weekends and major holidays. You have access to a nurse at all times for urgent questions. Please call the main number to the clinic Dept: [phone] and follow the prompts.   For any non-urgent questions, you may also contact your provider using MyChart. We now offer e-Visits for anyone 30 and older to request care online for non-urgent symptoms. For details visit [email or web address].   Also download the MyChart app! Go to the app store, search "MyChart", open the app, select [facility], and log in with your MyChart username and password.

## 2023-07-23 NOTE — Assessment & Plan Note (Signed)
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  -He has required multiple blood transfusions -We discussed the benefit and side effect of bevacizumab for AVM related GI bleeding, he started on 09/11/22, he received every 2 weeks for total of 4 cycles. -due to good response to beva and persistent anemia, will continue beva every 4 weeks as maintenance therapy

## 2023-07-23 NOTE — Assessment & Plan Note (Signed)
-  cTxN0M0, diagnosed in 12/2021, MMR normal  -I reviewed PET scan findings, which showed no evidence of distant metastasis  --I reviewed his molecular testing, MMR was normal, he is not a candidate for immunotherapy.  Foundation One showed no targetable mutations. -He completed RT on 04/09/2022 -Due to advanced age, I do not plan to offer him chemotherapy -CT scan in June 2024 showed no evidence of residual disease or new metastasis, although CT is not ideal to evaluate the residual disease in his case.

## 2023-07-23 NOTE — Progress Notes (Signed)
 [facility]   [phone] Fax:([phone]   Clinic Follow up Note   Patient Care Team: Charlanne Fredia CROME, MD as PCP - General (Internal Medicine) Ladona Heinz, MD as PCP - Cardiology (Cardiology) Sheldon Standing, MD as Consulting Physician (General Surgery) Avram Lupita BRAVO, MD as Consulting Physician (Gastroenterology) Jesus Oliphant, MD as Consulting Physician (Otolaryngology) Ladona Heinz, MD as Consulting Physician (Cardiology) Porter Andrez JONELLE DEVONNA as Physician Assistant (Dermatology) Lanny Callander, MD as Consulting Physician (Oncology)  Date of Service:  07/23/2023  CHIEF COMPLAINT: f/u of anemia and ampullary cancer  CURRENT THERAPY:  Maintenance bevacizumab  every 6 weeks  Oncology History   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  Foundation One showed no targetable mutations. -He completed RT on 04/09/2022 -Due to advanced age, I do not plan to offer him chemotherapy -CT scan in June 2024 showed no evidence of residual disease or new metastasis, although CT is not ideal to evaluate the residual disease in his case.        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  -He has required multiple blood transfusions -We discussed the benefit and side effect of bevacizumab  for AVM related GI bleeding, he started on 09/11/22, he received every 2 weeks for total of 4 cycles. -due to good response to beva and persistent anemia, will continue beva every 4 weeks as maintenance therapy   Assessment & Plan Chronic anemia Chronic anemia with current hemoglobin level at 10.1 g/dL,  improved from 9 g/dL previously. No recent bleeding episodes reported. Energy levels are adequate for daily activities. Maintenance therapy is effective. - Continue maintenance therapy with infusions every six weeks. - Proceed with today's infusion at 10:00 AM. - Schedule next appointment in six weeks.  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 No current symptoms such as jaundice or pain indicating recurrence or complications. Reports occasional digestive discomfort at night, not associated with pain or nausea.  Plan - Lab reviewed, hemoglobin 10.1 today, overall stable.  he is clinically doing well. - Will proceed bevacizumab  today, and continue every 6 weeks  SUMMARY OF ONCOLOGIC HISTORY: Oncology History  Cancer of ampulla of Vater (HCC)  12/06/2021 Imaging   CT ABDOMEN PELVIS W CONTRAST   IMPRESSION: 1. There is moderate intrahepatic bile duct dilatation with marked fusiform dilatation of the common bile duct. No CT visible common bile duct stones identified. Differential considerations include a obstructing distal common bile duct stone (not visible by CT), distal CBD stricture, or mass at the level of the ampullary. Consider further evaluation with contrast enhanced MRI/MRCP or ERCP. 2. Contour the liver appears nodular which may reflect underlying cirrhosis. 3. Age-indeterminate compression fracture involving L1 vertebral body with loss of 50% of the vertebral body height. No signs of retropulsion of fracture fragments into the canal. 4. Fat containing umbilical hernia. 5. 5 mm anterolisthesis of L4 on L5. 6.  Aortic Atherosclerosis (ICD10-I70.0).   12/16/2021 Imaging   MR ABDOMEN MRCP W WO CONTAST   IMPRESSION: 1. Moderate intrahepatic and extrahepatic bile duct dilation with smooth tapering to the level of the ampulla  without filling defect or focal mass lesion identified. Findings may reflect ampullary stricture or  occult ampullary mass. Consider further evaluation with ERCP. 2. Mild dilated main pancreatic duct at the head measuring up to 8 mm. 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 Recommend follow-up pre and post-contrast MRI/MRCP in 2 years. This recommendation follows ACR consensus guidelines: Management of Incidental Pancreatic Cysts: A White Paper of the ACR Incidental Findings Committee. J Am Coll Radiol 2017;14:911-923. 3.  Aortic Atherosclerosis (ICD10-I70.0).   01/23/2022 Procedure   DG ERCP   IMPRESSION: Dilatation of the extrahepatic bile duct with severe tapering or narrowing in the distal common bile duct. Placement of biliary stent.   These images were submitted for radiologic interpretation only. Please see the procedural report for the amount of contrast.    01/23/2022 Procedure   EUS/ERCP  ENDOSONOGRAPHIC FINDING: There was dilation in the common bile duct (12.3 mm -> 20.4 mm) and in the common hepatic duct (23.0 mm). A small amount of hyperechoic material consistent with sludge was visualized endosonographically in the common bile duct. Moderate hyperechoic material consistent with sludge was visualized endosonographically in the gallbladder with normal gallbladder wall thickness. Pancreatic parenchymal abnormalities were noted in the entire pancreas. These consisted of hyperechoic foci with shadowing and cysts. There was a 9.1 mm by 6.1 mm cyst in the neck of the pancreas. There was a 15.3 mm by 14.1 mm cyst in the tail of the pancreas. The pancreatic duct had a dilated endosonographic appearance, had a prominently branched endosonographic appearance and had hyperechoic walls in the pancreatic head (PD - 8.2 mm -> 4.3 mm), genu of the pancreas (3.5 mm), body of the pancreas (3.2 mm) and tail of the pancreas (2.8 mm). A hypoechoic irregular lesion was identified  endosonographically at the ampulla. The lesion measured 15 mm by 13 mm in maximal cross-sectional diameter. The lesion extended from the mucosa to the submucosa. The outer margins were irregular. This is noted where CBD and PD dilation is noted to occur. Endosonographic imaging in the visualized portion of the liver showed no mass.  No malignant-appearing lymph nodes were visualized in the celiac region (level 20), peripancreatic region and porta hepatis region. The celiac region was visualized.   01/23/2022 Pathology Results   SURGICAL PATHOLOGY  CASE: [id number]  PATIENT: Carl Woods  Surgical Pathology Report   FINAL MICROSCOPIC DIAGNOSIS:   A. STOMACH, RANDOM, BIOPSY:  - Gastric mucosa, no significant abnormality.  No inflammation,  intestinal metaplasia, dysplasia or malignancy.   B. AMPULLARY LESION, BIOPSY:  - Adenocarcinoma.  See comment.    01/31/2022 Initial Diagnosis   Cancer of ampulla of Vater (HCC)   02/07/2022 Imaging    IMPRESSION: Interval increase in size of lymph node in the anterior cardiophrenic space on the right.   Increase small right pleural effusion compared to the previous MRI.   Subtle nodular tissue along the margin of the right hepatic lobe in the upper abdomen posteriorly. Recommend additional workup when appropriate.   Calcified pleural plaques.   Evidence of chronic liver disease.   Aortic Atherosclerosis (ICD10-I70.0).   02/14/2022 Miscellaneous   Foundation One:  Biomarker Findings Microsatellite status-Cannot be determined Tumor Mutation Burden-Cannot be determined  Genomics Findings EPHB4 amplificatio SF3B1 K666T TP53 R273H       Discussed the use of AI scribe software for clinical note transcription with the patient, who gave verbal consent to proceed.  History  of Present Illness He is a 89 year old male with anemia who presents for follow-up.  Hemoglobin level has improved to 10.1 from 9. Energy levels are  adequate for daily activities. No recent bleeding.  He experiences nocturnal digestive unease without pain or nausea, resolving during the day. History of duodenal cancer at the ampulla with prior radiation treatment. No jaundice or pain currently.     All other systems were reviewed with the patient and are negative.  MEDICAL HISTORY:  Past Medical History:  Diagnosis Date   Anal fistula    Arthritis    Fingers and hands   Atrial fibrillation (HCC)    AVM (arteriovenous malformation)    Clipped during Colonoscopy 05/2016   Barrett's esophagus    Bilateral cataracts    BPH (benign prostatic hyperplasia)    Cecal angiodysplasia 05/28/2016   ablated at colonoscopy   Deviated nasal septum    Diverticulosis of sigmoid colon    E. 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   Left Forehead (treatment after biopsy)   OSA on CPAP    Perianal rash    Recurrent epistaxis    Renal cyst 01/04/2013   Small left peripelvic renal cysts , noted on US  Renal   SCCA (squamous cell carcinoma) of skin 10/17/2015   Left Sup Bridge of Nose (curet, cautery and 5FU)   Seasonal allergies    Superficial basal cell carcinoma (BCC) 10/17/2015   Left Bulb of Nose (curet, cautery and 5FU)   Tubular adenoma     SURGICAL HISTORY: Past Surgical History:  Procedure Laterality Date   BILIARY STENT PLACEMENT N/A 01/23/2022   Procedure: BILIARY STENT PLACEMENT;  Surgeon: Wilhelmenia Aloha Raddle., MD;  Location: [facility];  Service: Gastroenterology;  Laterality: N/A;   BILIARY STENT PLACEMENT N/A 08/06/2022   Procedure: BILIARY STENT PLACEMENT;  Surgeon: Charlanne Groom, MD;  Location: [facility];  Service: Gastroenterology;  Laterality: N/A;   BIOPSY  01/23/2022   Procedure: BIOPSY;  Surgeon: Wilhelmenia Aloha Raddle., MD;  Location: [facility];   Service: Gastroenterology;;   BIOPSY  07/09/2022   Procedure: BIOPSY;  Surgeon: San Sandor GAILS, DO;  Location: [facility];  Service: Gastroenterology;;   BIOPSY  08/02/2022   Procedure: BIOPSY;  Surgeon: Legrand Victory LITTIE DOUGLAS, MD;  Location: [facility];  Service: Gastroenterology;;   COLONOSCOPY  multiple   CYSTOSCOPY     ENDOSCOPIC RETROGRADE CHOLANGIOPANCREATOGRAPHY (ERCP) WITH PROPOFOL  N/A 01/23/2022   Procedure: ENDOSCOPIC RETROGRADE CHOLANGIOPANCREATOGRAPHY (ERCP) WITH PROPOFOL ;  Surgeon: Wilhelmenia Aloha Raddle., MD;  Location: [facility];  Service: Gastroenterology;  Laterality: N/A;   ENTEROSCOPY N/A 08/02/2022   Procedure: ENTEROSCOPY;  Surgeon: Legrand Victory LITTIE DOUGLAS, MD;  Location: [facility];  Service: Gastroenterology;  Laterality: N/A;   ERCP N/A 08/06/2022   Procedure: ENDOSCOPIC RETROGRADE CHOLANGIOPANCREATOGRAPHY (ERCP);  Surgeon: Charlanne Groom, MD;  Location: [facility];  Service: Gastroenterology;  Laterality: N/A;   ESOPHAGOGASTRODUODENOSCOPY  multiple   ESOPHAGOGASTRODUODENOSCOPY N/A 01/23/2022   Procedure: ESOPHAGOGASTRODUODENOSCOPY (EGD);  Surgeon: Wilhelmenia Aloha Raddle., MD;  Location: [facility];  Service: Gastroenterology;  Laterality: N/A;   ESOPHAGOGASTRODUODENOSCOPY N/A 07/09/2022   Procedure: ESOPHAGOGASTRODUODENOSCOPY (EGD);  Surgeon: San Sandor GAILS, DO;  Location: [facility];  Service: Gastroenterology;  Laterality: N/A;   ESOPHAGOGASTRODUODENOSCOPY N/A 08/21/2022   Procedure: ESOPHAGOGASTRODUODENOSCOPY (EGD);  Surgeon: Stacia Hamilton  E, MD;  Location: [facility];  Service: Gastroenterology;  Laterality: N/A;   EUS N/A 01/23/2022   Procedure: UPPER ENDOSCOPIC ULTRASOUND (EUS) RADIAL;  Surgeon: Wilhelmenia Aloha Raddle., MD;  Location: [facility];  Service: Gastroenterology;  Laterality: N/A;   EVALUATION UNDER ANESTHESIA WITH FISTULECTOMY N/A 03/02/2018   Procedure: ANORECTAL EXAM UNDER ANESTHESIA WITH REPAIR OF SUPERFICIAL PERIRECTAL FISTULA AND  HEMORRHOIDECTOMY;  Surgeon: Sheldon Standing, MD;  Location: [facility];  Service: General;  Laterality: N/A;   EXPLORATORY LAPAROTOMY     HEMOSTASIS CLIP PLACEMENT  07/09/2022   Procedure: HEMOSTASIS CLIP PLACEMENT;  Surgeon: San Sandor GAILS, DO;  Location: [facility];  Service: Gastroenterology;;   HOT HEMOSTASIS N/A 08/02/2022   Procedure: HOT HEMOSTASIS (ARGON PLASMA COAGULATION/BICAP);  Surgeon: Legrand Victory LITTIE DOUGLAS, MD;  Location: [facility];  Service: Gastroenterology;  Laterality: N/A;   HOT HEMOSTASIS N/A 08/21/2022   Procedure: HOT HEMOSTASIS (ARGON PLASMA COAGULATION/BICAP);  Surgeon: Stacia Glendia BRAVO, MD;  Location: [facility];  Service: Gastroenterology;  Laterality: N/A;   IR THORACENTESIS ASP PLEURAL SPACE W/IMG GUIDE  07/10/2022   REMOVAL OF STONES  01/23/2022   Procedure: REMOVAL OF STONES;  Surgeon: Wilhelmenia Aloha Raddle., MD;  Location: [facility];  Service: Gastroenterology;;   REMOVAL OF STONES  08/06/2022   Procedure: REMOVAL OF STONES;  Surgeon: Charlanne Groom, MD;  Location: [facility];  Service: Gastroenterology;;   ANNETT  01/23/2022   Procedure: ANNETT;  Surgeon: Mansouraty, Aloha Raddle., MD;  Location: [facility];  Service: Gastroenterology;;    I have reviewed the social history and family history with the patient and they are unchanged from previous note.  ALLERGIES:  is allergic to nsaids.  MEDICATIONS:  Current Outpatient Medications  Medication Sig Dispense Refill   acetaminophen  (TYLENOL ) 325 MG tablet Take 650 mg by mouth every 4 (four) hours as needed for fever or moderate pain (pain score 4-6).     cetirizine (ZYRTEC) 10 MG tablet Take 10 mg by mouth daily as needed for allergies.     Cholecalciferol  (VITAMIN D3) 5000 units CAPS Take 5,000 Units by mouth every morning.     dapagliflozin  propanediol (FARXIGA ) 5 MG TABS tablet Take 1 tablet (5 mg total) by mouth in the morning.     furosemide  (LASIX ) 20 MG tablet Take 20 mg by mouth  daily.     Glucosamine 500 MG CAPS Take 1 capsule by mouth every morning.     levothyroxine  (SYNTHROID ) 137 MCG tablet Take 1 tablet (137 mcg total) by mouth daily before breakfast.     Nutritional Supplements (BOOST PLUS PO) Take 1 Can by mouth once. 1 can orally one time a day for weight loss     nystatin  cream (MYCOSTATIN ) Apply 1 Application topically every 8 (eight) hours as needed (Yeast).     olopatadine  (PATANOL) 0.1 % ophthalmic solution Place 1 drop into both eyes daily as needed for allergies.     ondansetron  (ZOFRAN ) 8 MG tablet Take 1 tablet (8 mg total) by mouth every 8 (eight) hours as needed for nausea or vomiting. 20 tablet 0   pantoprazole  (PROTONIX ) 40 MG tablet Take 1 tablet (40 mg total) by mouth 2 (two) times daily.     potassium chloride  (KLOR-CON ) 10 MEQ tablet Take 10 mEq by mouth daily.     sodium chloride  (OCEAN) 0.65 % SOLN nasal spray Place 1 spray into both nostrils every 12 (twelve) hours as needed for congestion.     sucralfate  (CARAFATE ) 1 g tablet Take 1 g  by mouth 4 (four) times daily.     Zinc Oxide 10 % OINT Apply 1 Application topically as needed.     No current facility-administered medications for this visit.    PHYSICAL EXAMINATION: ECOG PERFORMANCE STATUS: 2 - Symptomatic, <50% confined to bed  Vitals:   07/23/23 0917 07/23/23 0922  BP: (!) 145/56 120/62  Pulse: 71   Resp: 15   Temp: (!) 97.2 F (36.2 C)   SpO2: 99%    Wt Readings from Last 3 Encounters:  07/23/23 205 lb 3.2 oz (93.1 kg)  07/20/23 216 lb 3.2 oz (98.1 kg)  07/01/23 216 lb 3.2 oz (98.1 kg)     GENERAL:alert, no distress and comfortable SKIN: skin color, texture, turgor are normal, no rashes or significant lesions EYES: normal, Conjunctiva are pink and non-injected, sclera clear NECK: supple, thyroid  normal size, non-tender, without nodularity LYMPH:  no palpable lymphadenopathy in the cervical, axillary  LUNGS: clear to auscultation and percussion with normal breathing  effort HEART: regular rate & rhythm and no murmurs and no lower extremity edema ABDOMEN:abdomen soft, non-tender and normal bowel sounds Musculoskeletal:no cyanosis of digits and no clubbing  NEURO: alert & oriented x 3 with fluent speech, no focal motor/sensory deficits  Physical Exam   LABORATORY DATA:  I have reviewed the data as listed    Latest Ref Rng & Units 07/23/2023    8:32 AM 07/09/2023   12:00 AM 06/11/2023   12:51 PM  CBC  WBC 4.0 - 10.5 K/uL 5.5  5.1     5.9   Hemoglobin 13.0 - 17.0 g/dL 89.8  9.1     89.9   Hematocrit 39.0 - 52.0 % 31.0  29     29.7   Platelets 150 - 400 K/uL 143  90     157      This result is from an external source.        Latest Ref Rng & Units 07/09/2023   12:00 AM 06/11/2023   12:51 PM 02/12/2023    1:13 PM  CMP  Glucose 70 - 99 mg/dL  89  94   BUN 4 - 21 26     26   36   Creatinine 0.6 - 1.3 1.5     1.64  1.57   Sodium 137 - 147 141     143  139   Potassium 3.5 - 5.1 mEq/L 3.5     3.5  4.0   Chloride 99 - 108 109     111  105   CO2 13 - 22 23     24  26    Calcium  8.7 - 10.7 8.2     8.6  10.1   Total Protein 6.5 - 8.1 g/dL  6.8  7.4   Total Bilirubin 0.0 - 1.2 mg/dL  0.5  0.4   Alkaline Phos 38 - 126 U/L  96  117   AST 15 - 41 U/L  27  41   ALT 0 - 44 U/L  21  37      This result is from an external source.      RADIOGRAPHIC STUDIES: I have personally reviewed the radiological images as listed and agreed with the findings in the report. No results found.    Orders Placed This Encounter  Procedures   CBC with Differential (Cancer Center Only)    Standing Status:   Future    Expected Date:   09/03/2023    Expiration Date:  09/02/2024   Total Protein, Urine dipstick    Standing Status:   Future    Expected Date:   09/03/2023    Expiration Date:   09/02/2024   CBC with Differential (Cancer Center Only)    Standing Status:   Future    Expected Date:   10/15/2023    Expiration Date:   10/14/2024   Total Protein, Urine dipstick     Standing Status:   Future    Expected Date:   10/15/2023    Expiration Date:   10/14/2024   All questions were answered. The patient knows to call the clinic with any problems, questions or concerns. No barriers to learning was detected. The total time spent in the appointment was 25 minutes, including review of chart and various tests results, discussions about plan of care and coordination of care plan     Onita Mattock, MD 07/23/2023

## 2023-07-30 ENCOUNTER — Other Ambulatory Visit: Payer: Self-pay | Admitting: Internal Medicine

## 2023-07-30 MED ORDER — NITROGLYCERIN 0.4 MG SL SUBL
0.4000 mg | SUBLINGUAL_TABLET | SUBLINGUAL | 3 refills | Status: AC | PRN
Start: 2023-07-30 — End: ?

## 2023-08-06 DIAGNOSIS — D649 Anemia, unspecified: Secondary | ICD-10-CM | POA: Diagnosis not present

## 2023-08-06 DIAGNOSIS — I10 Essential (primary) hypertension: Secondary | ICD-10-CM | POA: Diagnosis not present

## 2023-08-14 ENCOUNTER — Other Ambulatory Visit: Payer: Self-pay | Admitting: Orthopedic Surgery

## 2023-08-14 NOTE — Progress Notes (Signed)
 Recent BUN/creat 32/1.70, GFR 38 08/06/2023. Previous BUN/creat 26/1.52, GFR 43 07/09/2023. BUN/creat 25/1.44, GFR 46 05/21/2023. BLE improved. Will schedule furosemide /KCL every other day. F/u with oncology 08/07 with labs.

## 2023-08-20 DIAGNOSIS — G4733 Obstructive sleep apnea (adult) (pediatric): Secondary | ICD-10-CM | POA: Diagnosis not present

## 2023-09-03 ENCOUNTER — Encounter: Payer: Self-pay | Admitting: Hematology

## 2023-09-03 ENCOUNTER — Inpatient Hospital Stay: Admitting: Nurse Practitioner

## 2023-09-03 ENCOUNTER — Inpatient Hospital Stay

## 2023-09-03 ENCOUNTER — Inpatient Hospital Stay: Attending: Physician Assistant

## 2023-09-03 VITALS — BP 138/82 | HR 77 | Temp 97.5°F | Resp 17 | Wt 207.8 lb

## 2023-09-03 DIAGNOSIS — K5521 Angiodysplasia of colon with hemorrhage: Secondary | ICD-10-CM

## 2023-09-03 DIAGNOSIS — C241 Malignant neoplasm of ampulla of Vater: Secondary | ICD-10-CM

## 2023-09-03 DIAGNOSIS — Z5112 Encounter for antineoplastic immunotherapy: Secondary | ICD-10-CM | POA: Diagnosis not present

## 2023-09-03 DIAGNOSIS — D519 Vitamin B12 deficiency anemia, unspecified: Secondary | ICD-10-CM

## 2023-09-03 LAB — CBC WITH DIFFERENTIAL (CANCER CENTER ONLY)
Abs Immature Granulocytes: 0.03 K/uL (ref 0.00–0.07)
Basophils Absolute: 0 K/uL (ref 0.0–0.1)
Basophils Relative: 1 %
Eosinophils Absolute: 0.1 K/uL (ref 0.0–0.5)
Eosinophils Relative: 3 %
HCT: 30.5 % — ABNORMAL LOW (ref 39.0–52.0)
Hemoglobin: 10 g/dL — ABNORMAL LOW (ref 13.0–17.0)
Immature Granulocytes: 1 %
Lymphocytes Relative: 26 %
Lymphs Abs: 1.4 K/uL (ref 0.7–4.0)
MCH: 32.4 pg (ref 26.0–34.0)
MCHC: 32.8 g/dL (ref 30.0–36.0)
MCV: 98.7 fL (ref 80.0–100.0)
Monocytes Absolute: 0.6 K/uL (ref 0.1–1.0)
Monocytes Relative: 11 %
Neutro Abs: 3.1 K/uL (ref 1.7–7.7)
Neutrophils Relative %: 58 %
Platelet Count: 140 K/uL — ABNORMAL LOW (ref 150–400)
RBC: 3.09 MIL/uL — ABNORMAL LOW (ref 4.22–5.81)
RDW: 20.2 % — ABNORMAL HIGH (ref 11.5–15.5)
WBC Count: 5.2 K/uL (ref 4.0–10.5)
nRBC: 0 % (ref 0.0–0.2)

## 2023-09-03 LAB — COMPREHENSIVE METABOLIC PANEL WITH GFR
ALT: 42 U/L (ref 0–44)
AST: 38 U/L (ref 15–41)
Albumin: 3.4 g/dL — ABNORMAL LOW (ref 3.5–5.0)
Alkaline Phosphatase: 119 U/L (ref 38–126)
Anion gap: 7 (ref 5–15)
BUN: 29 mg/dL — ABNORMAL HIGH (ref 8–23)
CO2: 24 mmol/L (ref 22–32)
Calcium: 8.7 mg/dL — ABNORMAL LOW (ref 8.9–10.3)
Chloride: 111 mmol/L (ref 98–111)
Creatinine, Ser: 1.5 mg/dL — ABNORMAL HIGH (ref 0.61–1.24)
GFR, Estimated: 44 mL/min — ABNORMAL LOW (ref >60)
Glucose, Bld: 85 mg/dL (ref 70–99)
Potassium: 3.7 mmol/L (ref 3.5–5.1)
Sodium: 142 mmol/L (ref 135–145)
Total Bilirubin: 0.4 mg/dL (ref 0.0–1.2)
Total Protein: 6.6 g/dL (ref 6.5–8.1)

## 2023-09-03 LAB — TOTAL PROTEIN, URINE DIPSTICK: Protein, ur: 30 mg/dL — AB

## 2023-09-03 MED ORDER — FAMOTIDINE 20 MG PO TABS: 20.0000 mg | ORAL_TABLET | Freq: Two times a day (BID) | ORAL | 1 refills | Status: DC | PRN

## 2023-09-03 MED ORDER — SODIUM CHLORIDE 0.9 % IV SOLN
5.0000 mg/kg | Freq: Once | INTRAVENOUS | Status: AC
  Administered 2023-09-03: 400 mg via INTRAVENOUS
  Filled 2023-09-03: qty 16

## 2023-09-03 MED ORDER — SODIUM CHLORIDE 0.9 % IV SOLN: Freq: Once | INTRAVENOUS | Status: AC

## 2023-09-03 MED ORDER — CYANOCOBALAMIN 1000 MCG/ML IJ SOLN
1000.0000 ug | Freq: Once | INTRAMUSCULAR | Status: AC
  Administered 2023-09-03: 1000 ug via INTRAMUSCULAR

## 2023-09-03 NOTE — Patient Instructions (Signed)
 [facility] - A DEPT OF [facility]  Discharge Instructions: Thank you for choosing [facility] to provide your oncology and hematology care.   If you have a lab appointment with the Cancer Center, please go directly to the Cancer Center and check in at the registration area.   Wear comfortable clothing and clothing appropriate for easy access to any Portacath or PICC line.   We strive to give you quality time with your provider. You may need to reschedule your appointment if you arrive late (15 or more minutes).  Arriving late affects you and other patients whose appointments are after yours.  Also, if you miss three or more appointments without notifying the office, you may be dismissed from the clinic at the provider's discretion.      For prescription refill requests, have your pharmacy contact our office and allow 72 hours for refills to be completed.    Today you received the following chemotherapy and/or immunotherapy agents: bevacizumab      To help prevent nausea and vomiting after your treatment, we encourage you to take your nausea medication as directed.  BELOW ARE SYMPTOMS THAT SHOULD BE REPORTED IMMEDIATELY: *FEVER GREATER THAN 100.4 F (38 C) OR HIGHER *CHILLS OR SWEATING *NAUSEA AND VOMITING THAT IS NOT CONTROLLED WITH YOUR NAUSEA MEDICATION *UNUSUAL SHORTNESS OF BREATH *UNUSUAL BRUISING OR BLEEDING *URINARY PROBLEMS (pain or burning when urinating, or frequent urination) *BOWEL PROBLEMS (unusual diarrhea, constipation, pain near the anus) TENDERNESS IN MOUTH AND THROAT WITH OR WITHOUT PRESENCE OF ULCERS (sore throat, sores in mouth, or a toothache) UNUSUAL RASH, SWELLING OR PAIN  UNUSUAL VAGINAL DISCHARGE OR ITCHING   Items with * indicate a potential emergency and should be followed up as soon as possible or go to the Emergency Department if any problems should occur.  Please show the CHEMOTHERAPY ALERT CARD or  IMMUNOTHERAPY ALERT CARD at check-in to the Emergency Department and triage nurse.  Should you have questions after your visit or need to cancel or reschedule your appointment, please contact [facility] - A DEPT [facility]  Dept: [phone]  and follow the prompts.  Office hours are 8:00 a.m. to 4:30 p.m. Monday - Friday. Please note that voicemails left after 4:00 p.m. may not be returned until the following business day.  We are closed weekends and major holidays. You have access to a nurse at all times for urgent questions. Please call the main number to the clinic Dept: [phone] and follow the prompts.   For any non-urgent questions, you may also contact your provider using MyChart. We now offer e-Visits for anyone 81 and older to request care online for non-urgent symptoms. For details visit [email or web address].   Also download the MyChart app! Go to the app store, search "MyChart", open the app, select [facility], and log in with your MyChart username and password.

## 2023-09-03 NOTE — Progress Notes (Signed)
 Continue bevacizumab  at 400mg  despite wt increase per Powell, NP

## 2023-09-03 NOTE — Progress Notes (Signed)
 Patient Care Team: Charlanne Fredia CROME, MD as PCP - General (Internal Medicine) Ladona Heinz, MD as PCP - Cardiology (Cardiology) Sheldon Standing, MD as Consulting Physician (General Surgery) Avram Lupita BRAVO, MD as Consulting Physician (Gastroenterology) Jesus Oliphant, MD as Consulting Physician (Otolaryngology) Ladona Heinz, MD as Consulting Physician (Cardiology) Porter Andrez SAUNDERS, PA-C (Inactive) as Physician Assistant (Dermatology) Lanny Callander, MD as Consulting Physician (Oncology)  Clinic Day:  09/06/2023  Referring physician: Lanny Callander, MD  ASSESSMENT & PLAN:   Assessment & Plan: 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  Foundation One showed no targetable mutations. -He completed RT on 04/09/2022 -Due to advanced age, chemotherapy was not offered -CT scan in June 2024 showed no evidence of residual disease or new metastasis, although CT is not ideal to evaluate the residual disease in his case. -09/03/2023 -currently on bevacizumab  every 6 weeks and tolerating well.      GERD Patient with increased symptoms of acid reflux despite taking sucralfate  3 times daily.  Trial Pepcid  20 mg twice daily as needed.  New prescription sent to his pharmacy.  Fungal rash Presents in bilateral groin.  Currently seeing provider at friend's home where he resides.  Restarted on nystatin  cream which she is using 3 times daily.  He reports rash is gradually improving.  Plan Labs reviewed. -Stable anemia with Hgb 10.0 and HCT 30.5. -CMP showing stable reduction in renal functions with BUN 29, creatinine 1.5, and GFR 44. -Negative urine protein. - CA 19-9 normal at 13. Labs and patient presentation are satisfactory for treatment today. Proceed with bevacizumab  as scheduled. Labs, follow-up, and  treatment with bevacizumab  in 6 weeks.  The patient understands the plans discussed today and is in agreement with them.  He knows to contact our office if he develops concerns prior to his next appointment.  I provided 25 minutes of face-to-face time during this encounter and > 50% was spent counseling as documented under my assessment and plan.    Powell BRAVO Lessen, NP  [facility] - A DEPT OF [facility] [address] [place] [address] Dept: [phone] Dept Fax: [phone]   No orders of the defined types were placed in this encounter.     CHIEF COMPLAINT:  CC: cancer of Ampulla of Vater  Current Treatment:  maintenance bevacizumab  every 6 weeks   INTERVAL HISTORY:  Carl Woods is here today for repeat clinical assessment. He last saw Dr. Lanny 07/23/2023.  He does report a rash present in bilateral groin.  Currently using nystatin  cream and rash is improving.  He also reports having trouble with his teeth for which he is seeing a dentist.  He denies unusual bleeding such as blood in his stool, hematemesis, epistaxis, or hemoptysis.  He is having indigestion.  Currently taking sucralfate  prior to meals.  He denies chest pain, chest pressure, or shortness of breath. He denies headaches or visual disturbances. He denies abdominal pain, nausea, vomiting, or changes in bowel or bladder habits.   He denies fevers or chills. He denies pain. His appetite is good. His weight has been stable.  I have reviewed the past medical history, past surgical history, social history and family history with the patient and they are unchanged from previous note.  ALLERGIES:  is allergic to nsaids.  MEDICATIONS:  Current Outpatient Medications  Medication Sig Dispense Refill   acetaminophen  (TYLENOL ) 325 MG tablet Take 650 mg by mouth every 4 (four) hours as needed for fever or moderate pain (pain score 4-6).     cetirizine (ZYRTEC) 10 MG  tablet Take 10 mg by mouth daily as needed for allergies.     Cholecalciferol  (VITAMIN D3) 5000 units CAPS Take 5,000 Units by mouth every morning.     dapagliflozin  propanediol (FARXIGA ) 5 MG TABS tablet Take 1 tablet (5 mg total) by mouth in the morning.     famotidine  (PEPCID ) 20 MG tablet Take 1 tablet (20 mg total) by mouth 2 (two) times daily as needed for heartburn or indigestion. 60 tablet 1   furosemide  (LASIX ) 20 MG tablet Take 20 mg by mouth every other day. Give with KCL (Patient taking differently: Take 20 mg by mouth daily. Give with KCL)     Glucosamine 500 MG CAPS Take 1 capsule by mouth every morning.     levothyroxine  (SYNTHROID ) 137 MCG tablet Take 1 tablet (137 mcg total) by mouth daily before breakfast.     nitroGLYCERIN  (NITROSTAT ) 0.4 MG SL tablet Place 1 tablet (0.4 mg total) under the tongue every 5 (five) minutes as needed for chest pain. 50 tablet 3   Nutritional Supplements (BOOST PLUS PO) Take 1 Can by mouth once. 1 can orally one time a day for weight loss     nystatin  cream (MYCOSTATIN ) Apply 1 Application topically every 8 (eight) hours as needed (Yeast).     olopatadine  (PATANOL) 0.1 % ophthalmic solution Place 1 drop into both eyes daily as needed for allergies.     ondansetron  (ZOFRAN ) 8 MG tablet Take 1 tablet (8 mg total) by mouth every 8 (eight) hours as needed for nausea or vomiting. 20 tablet 0   pantoprazole  (PROTONIX ) 40 MG tablet Take 1 tablet (40 mg total) by mouth 2 (two) times daily.     potassium chloride  (KLOR-CON ) 10 MEQ tablet Take 10 mEq by mouth every other day. Give with furosemide      sodium chloride  (OCEAN) 0.65 % SOLN nasal spray Place 1 spray into both nostrils every 12 (twelve) hours as needed for congestion.     sucralfate  (CARAFATE ) 1 g tablet Take 1 g by mouth 4 (four) times daily.     Zinc Oxide 10 % OINT Apply 1 Application topically as needed.     No current facility-administered medications for this visit.    HISTORY OF PRESENT  ILLNESS:   Oncology History  Cancer of ampulla of Vater (HCC)  12/06/2021 Imaging   CT ABDOMEN PELVIS W CONTRAST   IMPRESSION: 1. There is moderate intrahepatic bile duct dilatation with marked fusiform dilatation of the common bile duct. No CT visible common bile duct stones identified. Differential considerations include a obstructing distal common bile duct stone (not visible by CT), distal CBD stricture, or mass at the level of the ampullary. Consider further evaluation with contrast enhanced MRI/MRCP or ERCP. 2. Contour the liver appears nodular which may reflect underlying cirrhosis. 3. Age-indeterminate compression fracture involving L1 vertebral body with loss of 50% of the vertebral body height. No signs of retropulsion of fracture fragments into the canal. 4. Fat containing umbilical hernia. 5. 5 mm anterolisthesis of L4 on L5. 6.  Aortic Atherosclerosis (ICD10-I70.0).   12/16/2021 Imaging   MR ABDOMEN MRCP W WO CONTAST   IMPRESSION: 1. Moderate intrahepatic and extrahepatic bile duct dilation  with smooth tapering to the level of the ampulla without filling defect or focal mass lesion identified. Findings may reflect ampullary stricture or occult ampullary mass. Consider further evaluation with ERCP. 2. Mild dilated main pancreatic duct at the head measuring up to 8 mm. 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 Recommend follow-up pre and post-contrast MRI/MRCP in 2 years. This recommendation follows ACR consensus guidelines: Management of Incidental Pancreatic Cysts: A White Paper of the ACR Incidental Findings Committee. J Am Coll Radiol 2017;14:911-923. 3.  Aortic Atherosclerosis (ICD10-I70.0).   01/23/2022 Procedure   DG ERCP   IMPRESSION: Dilatation of the extrahepatic bile duct with severe tapering or narrowing in the distal common bile duct. Placement of  biliary stent.   These images were submitted for radiologic interpretation only. Please see the procedural report for the amount of contrast.    01/23/2022 Procedure   EUS/ERCP  ENDOSONOGRAPHIC FINDING: There was dilation in the common bile duct (12.3 mm -> 20.4 mm) and in the common hepatic duct (23.0 mm). A small amount of hyperechoic material consistent with sludge was visualized endosonographically in the common bile duct. Moderate hyperechoic material consistent with sludge was visualized endosonographically in the gallbladder with normal gallbladder wall thickness. Pancreatic parenchymal abnormalities were noted in the entire pancreas. These consisted of hyperechoic foci with shadowing and cysts. There was a 9.1 mm by 6.1 mm cyst in the neck of the pancreas. There was a 15.3 mm by 14.1 mm cyst in the tail of the pancreas. The pancreatic duct had a dilated endosonographic appearance, had a prominently branched endosonographic appearance and had hyperechoic walls in the pancreatic head (PD - 8.2 mm -> 4.3 mm), genu of the pancreas (3.5 mm), body of the pancreas (3.2 mm) and tail of the pancreas (2.8 mm). A hypoechoic irregular lesion was identified endosonographically at the ampulla. The lesion measured 15 mm by 13 mm in maximal cross-sectional diameter. The lesion extended from the mucosa to the submucosa. The outer margins were irregular. This is noted where CBD and PD dilation is noted to occur. Endosonographic imaging in the visualized portion of the liver showed no mass.  No malignant-appearing lymph nodes were visualized in the celiac region (level 20), peripancreatic region and porta hepatis region. The celiac region was visualized.   01/23/2022 Pathology Results   SURGICAL PATHOLOGY  CASE: [id number]  PATIENT: Jonpaul Bostock  Surgical Pathology Report   FINAL MICROSCOPIC DIAGNOSIS:   A. STOMACH, RANDOM, BIOPSY:  - Gastric mucosa, no significant abnormality.  No  inflammation,  intestinal metaplasia, dysplasia or malignancy.   B. AMPULLARY LESION, BIOPSY:  - Adenocarcinoma.  See comment.    01/31/2022 Initial Diagnosis   Cancer of ampulla of Vater (HCC)   02/07/2022 Imaging    IMPRESSION: Interval increase in size of lymph node in the anterior cardiophrenic space on the right.   Increase small right pleural effusion compared to the previous MRI.   Subtle nodular tissue along the margin of the right hepatic lobe in the upper abdomen posteriorly. Recommend additional workup when appropriate.   Calcified pleural plaques.   Evidence of chronic liver disease.   Aortic Atherosclerosis (ICD10-I70.0).   02/14/2022 Miscellaneous   Foundation One:  Biomarker Findings Microsatellite status-Cannot be determined Tumor Mutation Burden-Cannot be determined  Genomics Findings EPHB4 amplificatio SF3B1 [id number] TP53 R273H        REVIEW OF SYSTEMS:   Constitutional: Denies fevers, chills or abnormal weight  loss Eyes: Denies blurriness of vision Ears, nose, mouth, throat, and face: Denies mucositis or sore throat Respiratory: Denies cough, dyspnea or wheezes Cardiovascular: Denies palpitation, chest discomfort or lower extremity swelling Gastrointestinal:  Denies nausea, heartburn or change in bowel habits Skin: skin rash in bilateral groin.  Lymphatics: Denies new lymphadenopathy or easy bruising Neurological:Denies numbness, tingling or new weaknesses Behavioral/Psych: Mood is stable, no new changes  All other systems were reviewed with the patient and are negative.   VITALS:   Today's Vitals   09/03/23 1256 09/03/23 1300  BP: 138/82   Pulse: 77   Resp: 17   Temp: (!) 97.5 F (36.4 C)   SpO2: 98%   Weight: 207 lb 12.8 oz (94.3 kg)   PainSc:  0-No pain   Body mass index is 31.6 kg/m.   Wt Readings from Last 3 Encounters:  09/03/23 207 lb 12.8 oz (94.3 kg)  07/23/23 205 lb 3.2 oz (93.1 kg)  07/20/23 216 lb 3.2 oz (98.1 kg)     Body mass index is 31.6 kg/m.  Performance status (ECOG): 1 - Symptomatic but completely ambulatory  PHYSICAL EXAM:   GENERAL:alert, no distress and comfortable SKIN: skin color, texture, turgor are normal, no rashes or significant lesions EYES: normal, Conjunctiva are pink and non-injected, sclera clear OROPHARYNX:no exudate, no erythema and lips, buccal mucosa, and tongue normal  NECK: supple, thyroid  normal size, non-tender, without nodularity LYMPH:  no palpable lymphadenopathy in the cervical, axillary or inguinal LUNGS: clear to auscultation and percussion with normal breathing effort HEART: regular rate & rhythm and no murmurs and no lower extremity edema ABDOMEN:abdomen soft, non-tender and normal bowel sounds Musculoskeletal:no cyanosis of digits and no clubbing  NEURO: alert & oriented x 3 with fluent speech, no focal motor/sensory deficits  LABORATORY DATA:  I have reviewed the data as listed    Component Value Date/Time   NA 142 09/03/2023 1233   NA 141 07/09/2023 0000   K 3.7 09/03/2023 1233   CL 111 09/03/2023 1233   CO2 24 09/03/2023 1233   GLUCOSE 85 09/03/2023 1233   BUN 29 (H) 09/03/2023 1233   BUN 26 (A) 07/09/2023 0000   CREATININE 1.50 (H) 09/03/2023 1233   CREATININE 1.57 (H) 02/12/2023 1313   CALCIUM  8.7 (L) 09/03/2023 1233   PROT 6.6 09/03/2023 1233   ALBUMIN 3.4 (L) 09/03/2023 1233   AST 38 09/03/2023 1233   AST 41 02/12/2023 1313   ALT 42 09/03/2023 1233   ALT 37 02/12/2023 1313   ALKPHOS 119 09/03/2023 1233   BILITOT 0.4 09/03/2023 1233   BILITOT 0.4 02/12/2023 1313   GFRNONAA 44 (L) 09/03/2023 1233   GFRNONAA 42 (L) 02/12/2023 1313   GFRAA 55 (L) 02/23/2018 1345    Lab Results  Component Value Date   WBC 5.2 09/03/2023   NEUTROABS 3.1 09/03/2023   HGB 10.0 (L) 09/03/2023   HCT 30.5 (L) 09/03/2023   MCV 98.7 09/03/2023   PLT 140 (L) 09/03/2023

## 2023-09-03 NOTE — Assessment & Plan Note (Addendum)
-  cTxN0M0, diagnosed in 12/2021, MMR normal  - PET scan findings reviewed, which showed no evidence of distant metastasis  --his molecular testing was reviewed, MMR was normal, he is not a candidate for immunotherapy.  Foundation One showed no targetable mutations. -He completed RT on 04/09/2022 -Due to advanced age, chemotherapy was not offered -CT scan in June 2024 showed no evidence of residual disease or new metastasis, although CT is not ideal to evaluate the residual disease in his case. -09/03/2023 -currently on bevacizumab  every 6 weeks and tolerating well.

## 2023-09-04 LAB — CANCER ANTIGEN 19-9: CA 19-9: 18 U/mL (ref 0–35)

## 2023-09-05 ENCOUNTER — Other Ambulatory Visit: Payer: Self-pay

## 2023-09-06 ENCOUNTER — Encounter: Payer: Self-pay | Admitting: Nurse Practitioner

## 2023-09-06 ENCOUNTER — Encounter: Payer: Self-pay | Admitting: Hematology

## 2023-09-08 ENCOUNTER — Encounter: Payer: Self-pay | Admitting: Pharmacist

## 2023-09-08 NOTE — Progress Notes (Signed)
   09/08/2023  Patient ID: Carl Woods, male   DOB: Jun 20, 1931, 89 y.o.   MRN: [id number]  Pharmacy Quality Measure Review  This patient is appearing on a report for being at risk of failing the adherence measure for diabetes medications this calendar year.   Medication: Farxiga  5 mg  Last fill date: 08/25/23 for 30 day supply  Patient is in a facility and get his medications filled through [facility]. He may be now living on the assisted living side.  [name], PharmD, BCACP Clinical Pharmacist [phone]

## 2023-09-10 ENCOUNTER — Encounter: Payer: Self-pay | Admitting: Nurse Practitioner

## 2023-09-10 ENCOUNTER — Ambulatory Visit: Admitting: Nurse Practitioner

## 2023-09-10 VITALS — BP 142/77 | HR 67 | Ht 69.0 in | Wt 210.4 lb

## 2023-09-10 DIAGNOSIS — G4733 Obstructive sleep apnea (adult) (pediatric): Secondary | ICD-10-CM

## 2023-09-10 NOTE — Progress Notes (Signed)
 @Patient  ID: Carl Woods, male    DOB: [birth year], 89 y.o.   MRN: [id number]  Chief Complaint  Patient presents with   Follow-up    8 week OSA f/u    Referring provider: Charlanne Fredia CROME, MD  HPI: 89 year old male, former smoker followed for OSA on CPAP. Past medical history significant for CHF, HTN, hx of MI, OSA on CPAP, Barrett's esophagus, GERD, hypothyroid, CKD, IDA. 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 He completed radiation treatment.  TEST/EVENTS:  01/06/2023 HST: AHI 35.1/h, SpO2 low 65%  01/07/2023: OV with Miquela Costabile NP Patient presents today with his son to establish care for OSA on CPAP.  He has a history of sleep apnea, diagnosed sometime around 2012.  He has been on CPAP since then.  Unfortunately, his CPAP broke about 2 months ago.  He had had the original machine that he had gotten after his for sleep study.  He has not had much in ways of formal follow-up after this.  Since he has been off CPAP, he is having difficulties with his sleep.  He used to sleep much better and was able to sleep more restfully at night.  He has been told that he snores. His wife told him without the CPAP, he stopped breathing when he slept. This is what initially prompted his workup for sleep apnea.  He does not currently drive.  Denies any morning headaches, sleep parasomnia/paralysis.  Wants to get back on CPAP therapy. Typically with CPAP, he was getting about 6 to 8 hours of sleep at night.  Without the CPAP, he has very fragmented sleep and feels more tired during the day.  He does not take any sleep aids.  His weight is down about 70 pounds since his last sleep study he thinks.  He did not notice any issues with his pressures on his CPAP when he was using it even though he had significant weight loss.  He was set at 10 cm of water and wore a nasal mask.  Does not have his previous sleep study results.  The medical supply company that he used to use has since gone out of business. He is  living at friend's home.  He is currently on the skilled living side but has plans to move to assisted living.  He is a former smoker.  Seldomly drinks alcohol.  No excessive caffeine intake.  09/10/2023: Today - follow up Patient presents today for follow-up to review CPAP use.  He had any sleep study after his last visit which showed severe sleep apnea.  He was ordered new CPAP machine which did take some time to get from the medical supply company.  He has been using it consistently since.  Wears it every night.  Is not sure if he notices a huge difference between when he was off of it and when he does wear it regarding his energy levels.  He does not snore when he is wearing the CPAP that he is aware of.  Does feel like his sleep is a little more restful.  Energy levels are fine during the day.  Wearing a nasal mask.  He does not drive.  No issues with morning headaches.  08/10/2023-09/08/2023: CPAP 10 cmH2O 30/30 days; 93% >4 hr average use 5 hours 44 minutes Leaks on 107 AHI 29.3  Allergies  Allergen Reactions   Nsaids Other (See Comments)    Patient is to not take these because of kidney issues  Immunization History  Administered Date(s) Administered   Fluad Quad(high Dose 65+) 11/26/2022   Influenza, Quadrivalent, Recombinant, Inj, Pf 01/08/2017, 10/04/2020   Moderna Covid-19 Vaccine Bivalent Booster 48yrs & up 12/03/2022   PFIZER Comirnaty(Gray Top)Covid-19 Tri-Sucrose Vaccine 03/05/2019, 03/28/2019   PPD Test 08/30/2022, 09/13/2022   Pfizer Covid-19 Vaccine Bivalent Booster 27yrs & up 01/12/2020   Pneumococcal Polysaccharide-23 06/22/2018   Zoster Recombinant(Shingrix) 04/18/2021, 08/20/2021    Past Medical History:  Diagnosis Date   Anal fistula    Arthritis    Fingers and hands   Atrial fibrillation (HCC)    AVM (arteriovenous malformation)    Clipped during Colonoscopy 05/2016   Barrett's esophagus    Bilateral cataracts    BPH (benign prostatic hyperplasia)     Cecal angiodysplasia 05/28/2016   ablated at colonoscopy   Deviated nasal septum    Diverticulosis of sigmoid colon    E. 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   Left Forehead (treatment after biopsy)   OSA on CPAP    Perianal rash    Recurrent epistaxis    Renal cyst 01/04/2013   Small left peripelvic renal cysts , noted on US  Renal   SCCA (squamous cell carcinoma) of skin 10/17/2015   Left Sup Bridge of Nose (curet, cautery and 5FU)   Seasonal allergies    Superficial basal cell carcinoma (BCC) 10/17/2015   Left Bulb of Nose (curet, cautery and 5FU)   Tubular adenoma     Tobacco History: Social History   Tobacco Use  Smoking Status Former   Current packs/day: 0.00   Average packs/day: 0.5 packs/day for 40.0 years (20.0 ttl pk-yrs)   Types: Cigarettes   Start date: 1966   Quit date: 2006   Years since quitting: 19.6  Smokeless Tobacco Never   Counseling given: Not Answered   Outpatient Medications Prior to Visit  Medication Sig Dispense Refill   acetaminophen  (TYLENOL ) 325 MG tablet Take 650 mg by mouth every 4 (four) hours as needed for fever or moderate pain (pain score 4-6).     cetirizine (ZYRTEC) 10 MG tablet Take 10 mg by mouth daily as needed for allergies.     Cholecalciferol  (VITAMIN D3) 5000 units CAPS Take 5,000 Units by mouth every morning.     dapagliflozin  propanediol (FARXIGA ) 5 MG TABS tablet Take 1 tablet (5 mg total) by mouth in the morning.     famotidine  (PEPCID ) 20 MG tablet Take 1 tablet (20 mg total) by mouth 2 (two) times daily as needed for heartburn or indigestion. 60 tablet 1   furosemide  (LASIX ) 20 MG tablet Take 20 mg by mouth every other day. Give with KCL     Glucosamine 500 MG CAPS Take 1 capsule by mouth every morning.     levothyroxine  (SYNTHROID ) 137 MCG tablet Take 1 tablet (137  mcg total) by mouth daily before breakfast.     nitroGLYCERIN  (NITROSTAT ) 0.4 MG SL tablet Place 1 tablet (0.4 mg total) under the tongue every 5 (five) minutes as needed for chest pain. 50 tablet 3   Nutritional Supplements (BOOST PLUS PO) Take 1 Can by mouth once. 1 can orally one time a day for weight loss     nystatin  cream (MYCOSTATIN ) Apply 1 Application topically every 8 (eight) hours as needed (Yeast).  olopatadine  (PATANOL) 0.1 % ophthalmic solution Place 1 drop into both eyes daily as needed for allergies.     ondansetron  (ZOFRAN ) 8 MG tablet Take 1 tablet (8 mg total) by mouth every 8 (eight) hours as needed for nausea or vomiting. 20 tablet 0   pantoprazole  (PROTONIX ) 40 MG tablet Take 1 tablet (40 mg total) by mouth 2 (two) times daily.     potassium chloride  (KLOR-CON ) 10 MEQ tablet Take 10 mEq by mouth every other day. Give with furosemide      sodium chloride  (OCEAN) 0.65 % SOLN nasal spray Place 1 spray into both nostrils every 12 (twelve) hours as needed for congestion.     sucralfate  (CARAFATE ) 1 g tablet Take 1 g by mouth 4 (four) times daily.     Zinc Oxide 10 % OINT Apply 1 Application topically as needed.     No facility-administered medications prior to visit.     Review of Systems: As above, otherwise negative     Physical Exam:  BP (!) 142/77   Pulse 67   Ht 5' 9 (1.753 m)   Wt 210 lb 6.4 oz (95.4 kg)   SpO2 97% Comment: RA  BMI 31.07 kg/m   GEN: Pleasant, interactive, well-kempt; elderly; in no acute distress HEENT:  Normocephalic and atraumatic. PERRLA. Sclera white. Nasal turbinates pink, moist and patent bilaterally. No rhinorrhea present. Oropharynx pink and moist, without exudate or edema. No lesions, ulcerations, or postnasal drip. Mallampati III NECK:  Supple w/ fair ROM.   CV: RRR, no m/r/g, no peripheral edema.  PULMONARY:  Unlabored, regular breathing. Clear bilaterally A&P w/o wheezes/rales/rhonchi. No accessory muscle use.  GI: BS present  and normoactive. Soft, non-tender to palpation.  MSK: No erythema, warmth or tenderness. Cap refil <2 sec all extrem.  Neuro: A/Ox3. No focal deficits noted.   Skin: Warm, no lesions or rashe Psych: Normal affect and behavior. Judgement and thought content appropriate.     Lab Results:  CBC    Component Value Date/Time   WBC 5.2 09/03/2023 1233   WBC 4.4 01/16/2023 1240   RBC 3.09 (L) 09/03/2023 1233   HGB 10.0 (L) 09/03/2023 1233   HCT 30.5 (L) 09/03/2023 1233   PLT 140 (L) 09/03/2023 1233   MCV 98.7 09/03/2023 1233   MCH 32.4 09/03/2023 1233   MCHC 32.8 09/03/2023 1233   RDW 20.2 (H) 09/03/2023 1233   LYMPHSABS 1.4 09/03/2023 1233   MONOABS 0.6 09/03/2023 1233   EOSABS 0.1 09/03/2023 1233   BASOSABS 0.0 09/03/2023 1233    BMET    Component Value Date/Time   NA 142 09/03/2023 1233   NA 141 07/09/2023 0000   K 3.7 09/03/2023 1233   CL 111 09/03/2023 1233   CO2 24 09/03/2023 1233   GLUCOSE 85 09/03/2023 1233   BUN 29 (H) 09/03/2023 1233   BUN 26 (A) 07/09/2023 0000   CREATININE 1.50 (H) 09/03/2023 1233   CREATININE 1.57 (H) 02/12/2023 1313   CALCIUM  8.7 (L) 09/03/2023 1233   GFRNONAA 44 (L) 09/03/2023 1233   GFRNONAA 42 (L) 02/12/2023 1313   GFRAA 55 (L) 02/23/2018 1345    BNP    Component Value Date/Time   BNP 358.5 (H) 07/10/2022 0139     Imaging:  No results found.  bevacizumab -adcd (VEGZELMA ) 400 mg in sodium chloride  0.9 % 100 mL chemo infusion     Date Action Dose Route User   07/23/2023 1035 Infusion Verify (none) Intravenous Kermit Fleeting, RN   07/23/2023 1035  Rate/Dose Change (none) Intravenous Kermit Fleeting, RN   07/23/2023 1034 New Bag/Given 400 mg Intravenous Kermit Fleeting, RN      bevacizumab -adcd (VEGZELMA ) 400 mg in sodium chloride  0.9 % 100 mL chemo infusion     Date Action Dose Route User   09/03/2023 1439 Infusion Verify (none) Intravenous Leigh Bernardino DASEN, [place]   09/03/2023 1439 New Bag/Given 400 mg Intravenous Leigh Bernardino T, RN       cyanocobalamin  (VITAMIN B12) injection 1,000 mcg     Date Action Dose Route User   07/23/2023 1013 Given 1,000 mcg Intramuscular (Right Deltoid) Kermit Fleeting, RN      cyanocobalamin  (VITAMIN B12) injection 1,000 mcg     Date Action Dose Route User   09/03/2023 1439 Given 1,000 mcg Intramuscular (Right Deltoid) Leigh Bernardino T, RN      0.9 %  sodium chloride  infusion     Date Action Dose Route User   07/23/2023 1050 Rate/Dose Change (none) Intravenous Kermit Fleeting, RN   07/23/2023 1048 Rate/Dose Change (none) Intravenous Kermit Fleeting, RN   07/23/2023 1013 New Bag/Given (none) Intravenous Kermit Fleeting, RN      0.9 %  sodium chloride  infusion     Date Action Dose Route User   09/03/2023 1452 Rate/Dose Change (none) Intravenous Leigh Bernardino DASEN, RN   09/03/2023 1436 New Bag/Given (none) Intravenous Hill, Ryan T, RN           No data to display          No results found for: NITRICOXIDE      Assessment & Plan:   OSA on CPAP Severe OSA on CPAP.  Excellent compliance.  He has large leaks and high residual events.  Possible that some of the residual events are coming from the leakage but also concerned that he is not well-managed.  Will adjust him to auto setting 8-20 cmH2O.  Recommended that he switch to a hybrid fullface mask such as an AirFit F40 or DreamWear.  Order placed today to DME company.  Aware of proper care/use of device.  Healthy weight management encouraged.  Understands risks of untreated sleep apnea.   Patient Instructions  Continue to use CPAP every night, minimum of 4-6 hours a night.  Change equipment as directed. Wash your tubing with warm soap and water daily, hang to dry. Wash humidifier portion weekly. Use bottled, distilled water and change daily Be aware of reduced alertness and do not drive or operate heavy machinery if experiencing this or drowsiness.  Exercise encouraged, as tolerated. Healthy weight management discussed.  Avoid or  decrease alcohol consumption and medications that make you more sleepy, if possible. Notify if persistent daytime sleepiness occurs even with consistent use of PAP therapy.  Change CPAP supplies... Every month Mask cushions and/or nasal pillows CPAP machine filters Every 3 months Mask frame (not including the headgear) CPAP tubing Every 6 months Mask headgear Chin strap (if applicable) Humidifier water tub  I adjusted your CPAP settings today  We need to get you a new mask. I've put in orders for this  Follow up in 6-8 weeks with Katie Bonne Whack,NP. If symptoms do not improve or worsen, please contact office for sooner follow up or seek emergency care.    Advised if symptoms do not improve or worsen, to please contact office for sooner follow up or seek emergency care.   I spent 35 minutes of dedicated to the care of this patient on the date of this encounter to include pre-visit review  of records, face-to-face time with the patient discussing conditions above, post visit ordering of testing, clinical documentation with the electronic health record, making appropriate referrals as documented, and communicating necessary findings to members of the patients care team.  [name] LULLA Rouleau, NP 09/10/2023  Pt aware and understands NP's role.

## 2023-09-10 NOTE — Assessment & Plan Note (Signed)
 Severe OSA on CPAP.  Excellent compliance.  He has large leaks and high residual events.  Possible that some of the residual events are coming from the leakage but also concerned that he is not well-managed.  Will adjust him to auto setting 8-20 cmH2O.  Recommended that he switch to a hybrid fullface mask such as an AirFit F40 or DreamWear.  Order placed today to DME company.  Aware of proper care/use of device.  Healthy weight management encouraged.  Understands risks of untreated sleep apnea.   Patient Instructions  Continue to use CPAP every night, minimum of 4-6 hours a night.  Change equipment as directed. Wash your tubing with warm soap and water daily, hang to dry. Wash humidifier portion weekly. Use bottled, distilled water and change daily Be aware of reduced alertness and do not drive or operate heavy machinery if experiencing this or drowsiness.  Exercise encouraged, as tolerated. Healthy weight management discussed.  Avoid or decrease alcohol consumption and medications that make you more sleepy, if possible. Notify if persistent daytime sleepiness occurs even with consistent use of PAP therapy.  Change CPAP supplies... Every month Mask cushions and/or nasal pillows CPAP machine filters Every 3 months Mask frame (not including the headgear) CPAP tubing Every 6 months Mask headgear Chin strap (if applicable) Humidifier water tub  I adjusted your CPAP settings today  We need to get you a new mask. I've put in orders for this  Follow up in 6-8 weeks with Katie Jacoya Bauman,NP. If symptoms do not improve or worsen, please contact office for sooner follow up or seek emergency care.

## 2023-09-10 NOTE — Patient Instructions (Signed)
 Continue to use CPAP every night, minimum of 4-6 hours a night.  Change equipment as directed. Wash your tubing with warm soap and water daily, hang to dry. Wash humidifier portion weekly. Use bottled, distilled water and change daily Be aware of reduced alertness and do not drive or operate heavy machinery if experiencing this or drowsiness.  Exercise encouraged, as tolerated. Healthy weight management discussed.  Avoid or decrease alcohol consumption and medications that make you more sleepy, if possible. Notify if persistent daytime sleepiness occurs even with consistent use of PAP therapy.  Change CPAP supplies... Every month Mask cushions and/or nasal pillows CPAP machine filters Every 3 months Mask frame (not including the headgear) CPAP tubing Every 6 months Mask headgear Chin strap (if applicable) Humidifier water tub  I adjusted your CPAP settings today  We need to get you a new mask. I've put in orders for this  Follow up in 6-8 weeks with Carl Kionte Baumgardner,NP. If symptoms do not improve or worsen, please contact office for sooner follow up or seek emergency care.

## 2023-09-11 ENCOUNTER — Other Ambulatory Visit: Payer: Self-pay

## 2023-09-20 DIAGNOSIS — G4733 Obstructive sleep apnea (adult) (pediatric): Secondary | ICD-10-CM | POA: Diagnosis not present

## 2023-10-06 ENCOUNTER — Encounter: Payer: Self-pay | Admitting: Adult Health

## 2023-10-06 ENCOUNTER — Non-Acute Institutional Stay: Payer: Self-pay | Admitting: Adult Health

## 2023-10-06 ENCOUNTER — Emergency Department (HOSPITAL_COMMUNITY)

## 2023-10-06 ENCOUNTER — Emergency Department (HOSPITAL_COMMUNITY)
Admission: EM | Admit: 2023-10-06 | Discharge: 2023-10-06 | Disposition: A | Discharge status: Home / Self Care | Attending: Emergency Medicine | Admitting: Emergency Medicine

## 2023-10-06 ENCOUNTER — Other Ambulatory Visit: Payer: Self-pay

## 2023-10-06 ENCOUNTER — Encounter (HOSPITAL_COMMUNITY): Payer: Self-pay

## 2023-10-06 DIAGNOSIS — K7689 Other specified diseases of liver: Secondary | ICD-10-CM | POA: Diagnosis not present

## 2023-10-06 DIAGNOSIS — I213 ST elevation (STEMI) myocardial infarction of unspecified site: Secondary | ICD-10-CM | POA: Diagnosis not present

## 2023-10-06 DIAGNOSIS — R001 Bradycardia, unspecified: Secondary | ICD-10-CM | POA: Diagnosis not present

## 2023-10-06 DIAGNOSIS — K529 Noninfective gastroenteritis and colitis, unspecified: Secondary | ICD-10-CM | POA: Diagnosis not present

## 2023-10-06 DIAGNOSIS — R1010 Upper abdominal pain, unspecified: Secondary | ICD-10-CM | POA: Diagnosis present

## 2023-10-06 DIAGNOSIS — I5032 Chronic diastolic (congestive) heart failure: Secondary | ICD-10-CM

## 2023-10-06 DIAGNOSIS — R9431 Abnormal electrocardiogram [ECG] [EKG]: Secondary | ICD-10-CM | POA: Diagnosis not present

## 2023-10-06 DIAGNOSIS — Z79890 Hormone replacement therapy (postmenopausal): Secondary | ICD-10-CM | POA: Diagnosis not present

## 2023-10-06 DIAGNOSIS — I4891 Unspecified atrial fibrillation: Secondary | ICD-10-CM | POA: Diagnosis not present

## 2023-10-06 DIAGNOSIS — R1013 Epigastric pain: Secondary | ICD-10-CM | POA: Diagnosis not present

## 2023-10-06 DIAGNOSIS — K2980 Duodenitis without bleeding: Secondary | ICD-10-CM

## 2023-10-06 DIAGNOSIS — Z8719 Personal history of other diseases of the digestive system: Secondary | ICD-10-CM | POA: Diagnosis not present

## 2023-10-06 DIAGNOSIS — K828 Other specified diseases of gallbladder: Secondary | ICD-10-CM | POA: Diagnosis not present

## 2023-10-06 DIAGNOSIS — E039 Hypothyroidism, unspecified: Secondary | ICD-10-CM

## 2023-10-06 DIAGNOSIS — K8689 Other specified diseases of pancreas: Secondary | ICD-10-CM | POA: Diagnosis not present

## 2023-10-06 DIAGNOSIS — K7460 Unspecified cirrhosis of liver: Secondary | ICD-10-CM | POA: Diagnosis not present

## 2023-10-06 LAB — CBC WITH DIFFERENTIAL/PLATELET
Abs Immature Granulocytes: 0.03 K/uL (ref 0.00–0.07)
Basophils Absolute: 0 K/uL (ref 0.0–0.1)
Basophils Relative: 1 %
Eosinophils Absolute: 0.1 K/uL (ref 0.0–0.5)
Eosinophils Relative: 1 %
HCT: 30.9 % — ABNORMAL LOW (ref 39.0–52.0)
Hemoglobin: 9.9 g/dL — ABNORMAL LOW (ref 13.0–17.0)
Immature Granulocytes: 0 %
Lymphocytes Relative: 12 %
Lymphs Abs: 0.9 K/uL (ref 0.7–4.0)
MCH: 32.8 pg (ref 26.0–34.0)
MCHC: 32 g/dL (ref 30.0–36.0)
MCV: 102.3 fL — ABNORMAL HIGH (ref 80.0–100.0)
Monocytes Absolute: 0.7 K/uL (ref 0.1–1.0)
Monocytes Relative: 9 %
Neutro Abs: 5.6 K/uL (ref 1.7–7.7)
Neutrophils Relative %: 77 %
Platelets: 150 K/uL (ref 150–400)
RBC: 3.02 MIL/uL — ABNORMAL LOW (ref 4.22–5.81)
RDW: 20.2 % — ABNORMAL HIGH (ref 11.5–15.5)
WBC: 7.3 K/uL (ref 4.0–10.5)
nRBC: 0 % (ref 0.0–0.2)

## 2023-10-06 LAB — COMPREHENSIVE METABOLIC PANEL WITH GFR
ALT: 27 U/L (ref 0–44)
AST: 33 U/L (ref 15–41)
Albumin: 2.9 g/dL — ABNORMAL LOW (ref 3.5–5.0)
Alkaline Phosphatase: 95 U/L (ref 38–126)
Anion gap: 11 (ref 5–15)
BUN: 23 mg/dL (ref 8–23)
CO2: 19 mmol/L — ABNORMAL LOW (ref 22–32)
Calcium: 8.5 mg/dL — ABNORMAL LOW (ref 8.9–10.3)
Chloride: 108 mmol/L (ref 98–111)
Creatinine, Ser: 1.55 mg/dL — ABNORMAL HIGH (ref 0.61–1.24)
GFR, Estimated: 42 mL/min — ABNORMAL LOW (ref >60)
Glucose, Bld: 134 mg/dL — ABNORMAL HIGH (ref 70–99)
Potassium: 3.6 mmol/L (ref 3.5–5.1)
Sodium: 138 mmol/L (ref 135–145)
Total Bilirubin: 0.8 mg/dL (ref 0.0–1.2)
Total Protein: 6.4 g/dL — ABNORMAL LOW (ref 6.5–8.1)

## 2023-10-06 LAB — LIPASE, BLOOD: Lipase: 10 U/L — ABNORMAL LOW (ref 11–51)

## 2023-10-06 LAB — TROPONIN I (HIGH SENSITIVITY)
Troponin I (High Sensitivity): 9 ng/L (ref <18)
Troponin I (High Sensitivity): 9 ng/L (ref <18)

## 2023-10-06 MED ORDER — ONDANSETRON 4 MG PO TBDP: 4.0000 mg | ORAL_TABLET | Freq: Three times a day (TID) | ORAL | 0 refills | Status: DC | PRN

## 2023-10-06 MED ORDER — DICYCLOMINE HCL 20 MG PO TABS
20.0000 mg | ORAL_TABLET | Freq: Two times a day (BID) | ORAL | 0 refills | Status: DC
Start: 2023-10-06 — End: 2023-10-22

## 2023-10-06 MED ORDER — FENTANYL CITRATE PF 50 MCG/ML IJ SOSY
50.0000 ug | PREFILLED_SYRINGE | Freq: Once | INTRAMUSCULAR | Status: AC
  Administered 2023-10-06: 50 ug via INTRAVENOUS
  Filled 2023-10-06: qty 1

## 2023-10-06 MED ORDER — IOHEXOL 350 MG/ML SOLN
75.0000 mL | Freq: Once | INTRAVENOUS | Status: AC | PRN
  Administered 2023-10-06: 75 mL via INTRAVENOUS

## 2023-10-06 NOTE — Progress Notes (Signed)
 Location:  [facility] Room Number: 31A Place of Service:  ALF [phone]) Provider:  Medina-Vargas, Cynthia Stainback, DNP, FNP-BC  Patient Care Team: Charlanne Fredia CROME, MD as PCP - General (Internal Medicine) Ladona Heinz, MD as PCP - Cardiology (Cardiology) Sheldon Standing, MD as Consulting Physician (General Surgery) Avram Lupita BRAVO, MD as Consulting Physician (Gastroenterology) Jesus Oliphant, MD as Consulting Physician (Otolaryngology) Ladona Heinz, MD as Consulting Physician (Cardiology) Porter Andrez SAUNDERS, PA-C (Inactive) as Physician Assistant (Dermatology) Lanny Callander, MD as Consulting Physician (Oncology)  Extended Emergency Contact Information Primary Emergency Contact: Gilman,[name] Address: **call 2x if no answer [facility]st time[facility]          [address]          [place], [place] [address] [place] Mobile Phone: [phone] Relation: Daughter Secondary Emergency Contact: TRISTAN MICKEY [place], [place] [address] [place] Home Phone: [phone] Mobile Phone: [phone] Relation: Son  Code Status:   DNR  Goals of care: Advanced Directive information    10/06/2023    1:21 AM  Advanced Directives  Does Patient Have a Medical Advance Directive? No  Would patient like information on creating a medical advance directive? No - Patient declined     Chief Complaint  Patient presents with   Acute Visit    Abdominal pain/follow up ED visit    HPI:  Pt is a 89 y.o. male seen today for follow up of ED visit for abdominal pain.   He went to ED today for abdominal pain and was diagnosed with Duodenitis. He was discharged with Dicyclomine  20 mg BID.  He was seen in the room and complained of epigastric pain. He denies constipation. No nausea nor vomiting.  He is currently on Pepcid  20 mg BID PRN, Protonix  40 mg BID and Carafate  1 gm QID for history of GI Bleed. Latest hgb 9.9, stable.  No reported shortness of breath. He takes Furosemide  20 mg  every other day and Dapagliflozin  5 mg daily for CHF.   He takes Levothyroxine  137 mcg daily for hypothyroidism.  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   Left Forehead (treatment after biopsy)   OSA on CPAP    Perianal rash    Recurrent epistaxis    Renal cyst 01/04/2013   Small left peripelvic renal cysts , noted on US  Renal   SCCA (squamous cell carcinoma) of skin 10/17/2015   Left Sup Bridge of Nose (curet, cautery and 5FU)   Seasonal allergies    Superficial basal cell carcinoma (BCC) 10/17/2015   Left Bulb of Nose (curet, cautery and 5FU)   Tubular adenoma    Past Surgical History:  Procedure Laterality Date   BILIARY STENT PLACEMENT N/A 01/23/2022   Procedure: BILIARY STENT PLACEMENT;  Surgeon: Wilhelmenia,  Aloha Raddle., MD;  Location: [facility];  Service: Gastroenterology;  Laterality: N/A;   BILIARY STENT PLACEMENT N/A 08/06/2022   Procedure: BILIARY STENT PLACEMENT;  Surgeon: Charlanne Groom, MD;  Location: [facility];  Service: Gastroenterology;  Laterality: N/A;   BIOPSY  01/23/2022   Procedure: BIOPSY;  Surgeon: Wilhelmenia Aloha Raddle., MD;  Location: [facility];  Service: Gastroenterology;;   BIOPSY  07/09/2022   Procedure: BIOPSY;  Surgeon: San Sandor GAILS, DO;  Location: [facility];  Service: Gastroenterology;;   BIOPSY  08/02/2022   Procedure: BIOPSY;  Surgeon: Legrand Victory LITTIE DOUGLAS, MD;  Location: [facility];  Service: Gastroenterology;;   COLONOSCOPY  multiple   CYSTOSCOPY     ENDOSCOPIC RETROGRADE CHOLANGIOPANCREATOGRAPHY (ERCP) WITH PROPOFOL  N/A 01/23/2022   Procedure: ENDOSCOPIC RETROGRADE CHOLANGIOPANCREATOGRAPHY (ERCP) WITH PROPOFOL ;  Surgeon: Wilhelmenia Aloha Raddle., MD;  Location: [facility];  Service: Gastroenterology;  Laterality: N/A;   ENTEROSCOPY N/A 08/02/2022   Procedure: ENTEROSCOPY;  Surgeon: Legrand Victory LITTIE DOUGLAS, MD;  Location: [facility];  Service: Gastroenterology;  Laterality: N/A;   ERCP N/A 08/06/2022   Procedure: ENDOSCOPIC RETROGRADE CHOLANGIOPANCREATOGRAPHY (ERCP);  Surgeon: Charlanne Groom, MD;  Location: [facility];  Service: Gastroenterology;  Laterality: N/A;   ESOPHAGOGASTRODUODENOSCOPY  multiple   ESOPHAGOGASTRODUODENOSCOPY N/A 01/23/2022   Procedure: ESOPHAGOGASTRODUODENOSCOPY (EGD);  Surgeon: Wilhelmenia Aloha Raddle., MD;  Location: [facility];  Service: Gastroenterology;  Laterality: N/A;   ESOPHAGOGASTRODUODENOSCOPY N/A 07/09/2022   Procedure: ESOPHAGOGASTRODUODENOSCOPY (EGD);  Surgeon: San Sandor GAILS, DO;  Location: [facility];  Service: Gastroenterology;  Laterality: N/A;   ESOPHAGOGASTRODUODENOSCOPY N/A 08/21/2022   Procedure: ESOPHAGOGASTRODUODENOSCOPY (EGD);  Surgeon: Stacia Glendia BRAVO, MD;  Location: [facility];  Service: Gastroenterology;  Laterality: N/A;   EUS N/A 01/23/2022   Procedure: UPPER ENDOSCOPIC ULTRASOUND (EUS) RADIAL;  Surgeon: Wilhelmenia Aloha Raddle., MD;  Location: [facility];  Service: Gastroenterology;  Laterality: N/A;   EVALUATION UNDER ANESTHESIA WITH FISTULECTOMY N/A 03/02/2018   Procedure: ANORECTAL EXAM UNDER ANESTHESIA WITH REPAIR OF SUPERFICIAL PERIRECTAL FISTULA AND HEMORRHOIDECTOMY;  Surgeon: Sheldon Standing, MD;  Location: [facility];  Service: General;  Laterality: N/A;   EXPLORATORY LAPAROTOMY     HEMOSTASIS CLIP PLACEMENT  07/09/2022   Procedure: HEMOSTASIS CLIP PLACEMENT;  Surgeon: San Sandor GAILS, DO;   Location: [facility];  Service: Gastroenterology;;   HOT HEMOSTASIS N/A 08/02/2022   Procedure: HOT HEMOSTASIS (ARGON PLASMA COAGULATION/BICAP);  Surgeon: Legrand Victory LITTIE DOUGLAS, MD;  Location: [facility];  Service: Gastroenterology;  Laterality: N/A;   HOT HEMOSTASIS N/A 08/21/2022   Procedure: HOT HEMOSTASIS (ARGON PLASMA COAGULATION/BICAP);  Surgeon: Stacia Glendia BRAVO, MD;  Location: [facility];  Service: Gastroenterology;  Laterality: N/A;   IR THORACENTESIS ASP PLEURAL SPACE W/IMG GUIDE  07/10/2022   REMOVAL OF STONES  01/23/2022   Procedure: REMOVAL OF STONES;  Surgeon: Wilhelmenia Aloha Raddle., MD;  Location: [facility];  Service: Gastroenterology;;   REMOVAL OF STONES  08/06/2022   Procedure: REMOVAL OF STONES;  Surgeon: Charlanne Groom, MD;  Location: [facility];  Service: Gastroenterology;;   ANNETT  01/23/2022   Procedure: ANNETT;  Surgeon: Wilhelmenia Aloha Raddle., MD;  Location: [facility];  Service: Gastroenterology;;    Allergies  Allergen Reactions   Nsaids Other (See Comments)    Patient is to not take these because of kidney issues    Outpatient Encounter Medications as of 10/06/2023  Medication Sig   acetaminophen  (TYLENOL ) 325 MG tablet Take 650 mg by mouth every 4 (four) hours as needed for fever or moderate pain (pain score 4-6).  cetirizine (ZYRTEC) 10 MG tablet Take 10 mg by mouth daily as needed for allergies.   Cholecalciferol  (VITAMIN D3) 5000 units CAPS Take 5,000 Units by mouth every morning.   dapagliflozin  propanediol (FARXIGA ) 5 MG TABS tablet Take 1 tablet (5 mg total) by mouth in the morning.   dicyclomine  (BENTYL ) 20 MG tablet Take 1 tablet (20 mg total) by mouth 2 (two) times daily.   famotidine  (PEPCID ) 20 MG tablet Take 1 tablet (20 mg total) by mouth 2 (two) times daily as needed for heartburn or indigestion.   furosemide  (LASIX ) 20 MG tablet Take 20 mg by mouth every other day. Give with KCL   Glucosamine 500 MG CAPS Take 1 capsule  by mouth every morning.   levothyroxine  (SYNTHROID ) 137 MCG tablet Take 1 tablet (137 mcg total) by mouth daily before breakfast.   nitroGLYCERIN  (NITROSTAT ) 0.4 MG SL tablet Place 1 tablet (0.4 mg total) under the tongue every 5 (five) minutes as needed for chest pain.   Nutritional Supplements (BOOST PLUS PO) Take 1 Can by mouth once. 1 can orally one time a day for weight loss   nystatin  cream (MYCOSTATIN ) Apply 1 Application topically every 8 (eight) hours as needed (Yeast).   olopatadine  (PATANOL) 0.1 % ophthalmic solution Place 1 drop into both eyes daily as needed for allergies.   ondansetron  (ZOFRAN ) 8 MG tablet Take 1 tablet (8 mg total) by mouth every 8 (eight) hours as needed for nausea or vomiting.   ondansetron  (ZOFRAN -ODT) 4 MG disintegrating tablet Take 1 tablet (4 mg total) by mouth every 8 (eight) hours as needed.   pantoprazole  (PROTONIX ) 40 MG tablet Take 1 tablet (40 mg total) by mouth 2 (two) times daily.   potassium chloride  (KLOR-CON ) 10 MEQ tablet Take 10 mEq by mouth every other day. Give with furosemide    sodium chloride  (OCEAN) 0.65 % SOLN nasal spray Place 1 spray into both nostrils every 12 (twelve) hours as needed for congestion.   sucralfate  (CARAFATE ) 1 g tablet Take 1 g by mouth 4 (four) times daily.   Zinc Oxide 10 % OINT Apply 1 Application topically as needed.   No facility-administered encounter medications on file as of 10/06/2023.    Review of Systems  Constitutional:  Negative for activity change, appetite change and fever.  HENT:  Negative for sore throat.   Eyes: Negative.   Cardiovascular:  Negative for chest pain and leg swelling.  Gastrointestinal:  Positive for abdominal pain. Negative for abdominal distention, diarrhea and vomiting.       Epigastric pain  Genitourinary:  Negative for dysuria, frequency and urgency.  Skin:  Negative for color change.  Neurological:  Negative for dizziness and headaches.  Psychiatric/Behavioral:  Negative for  behavioral problems and sleep disturbance. The patient is not nervous/anxious.      Immunization History  Administered Date(s) Administered   Fluad Quad(high Dose 65+) 11/26/2022   Influenza, Quadrivalent, Recombinant, Inj, Pf 01/08/2017, 10/04/2020   Moderna Covid-19 Vaccine Bivalent Booster 5yrs & up 12/03/2022   PFIZER Comirnaty(Gray Top)Covid-19 Tri-Sucrose Vaccine 03/05/2019, 03/28/2019   PPD Test 08/30/2022, 09/13/2022   Pfizer Covid-19 Vaccine Bivalent Booster 68yrs & up 01/12/2020   Pneumococcal Polysaccharide-23 06/22/2018   Zoster Recombinant(Shingrix) 04/18/2021, 08/20/2021   Pertinent  Health Maintenance Due  Topic Date Due   Influenza Vaccine  08/28/2023      11/26/2022    1:45 PM 12/24/2022   12:20 PM 01/14/2023    3:16 PM 02/26/2023    8:59 AM 06/29/2023  3:20 PM  Fall Risk  Falls in the past year? 1 1 1  0 0  Was there an injury with Fall? 1 1 1   0  Fall Risk Category Calculator 2 2 2   0  Patient at Risk for Falls Due to History of fall(s);Impaired balance/gait;Impaired mobility History of fall(s);Impaired balance/gait;Impaired mobility History of fall(s);Impaired balance/gait;Impaired mobility  History of fall(s)  Fall risk Follow up Falls evaluation completed;Education provided;Falls prevention discussed Falls evaluation completed;Education provided;Falls prevention discussed Falls evaluation completed;Education provided;Falls prevention discussed  Falls evaluation completed;Education provided     Vitals:   10/06/23 1222 10/06/23 1223  BP: (!) 174/71 (!) 164/80  Pulse: 82   Resp: 16   Temp: (!) 97 F (36.1 C)   SpO2: 95%   Weight: 209 lb 3.2 oz (94.9 kg)   Height: 5' 8 (1.727 m)    Body mass index is 31.81 kg/m.  Physical Exam Constitutional:      Appearance: He is obese.  HENT:     Head: Normocephalic and atraumatic.     Mouth/Throat:     Mouth: Mucous membranes are moist.  Eyes:     Conjunctiva/sclera: Conjunctivae normal.  Cardiovascular:      Rate and Rhythm: Normal rate and regular rhythm.     Pulses: Normal pulses.     Heart sounds: Normal heart sounds.  Pulmonary:     Effort: Pulmonary effort is normal.     Breath sounds: Normal breath sounds.  Abdominal:     General: Bowel sounds are normal.     Palpations: Abdomen is soft.  Musculoskeletal:        General: No swelling. Normal range of motion.     Cervical back: Normal range of motion.  Skin:    General: Skin is warm and dry.  Neurological:     General: No focal deficit present.     Mental Status: He is alert and oriented to person, place, and time.  Psychiatric:        Mood and Affect: Mood normal.        Behavior: Behavior normal.        Thought Content: Thought content normal.        Judgment: Judgment normal.        Labs reviewed: Recent Labs    06/11/23 1251 07/09/23 0000 09/03/23 1233 10/06/23 0202  NA 143 141 142 138  K 3.5 3.5 3.7 3.6  CL 111 109* 111 108  CO2 24 23* 24 19*  GLUCOSE 89  --  85 134*  BUN 26* 26* 29* 23  CREATININE 1.64* 1.5* 1.50* 1.55*  CALCIUM  8.6* 8.2* 8.7* 8.5*   Recent Labs    06/11/23 1251 09/03/23 1233 10/06/23 0202  AST 27 38 33  ALT 21 42 27  ALKPHOS 96 119 95  BILITOT 0.5 0.4 0.8  PROT 6.8 6.6 6.4*  ALBUMIN 3.5 3.4* 2.9*   Recent Labs    07/23/23 0832 09/03/23 1233 10/06/23 0202  WBC 5.5 5.2 7.3  NEUTROABS 3.6 3.1 5.6  HGB 10.1* 10.0* 9.9*  HCT 31.0* 30.5* 30.9*  MCV 98.7 98.7 102.3*  PLT 143* 140* 150   Lab Results  Component Value Date   TSH 0.14 (A) 01/19/2023   Lab Results  Component Value Date   HGBA1C 4.4 (L) 08/02/2022   No results found for: CHOL, HDL, LDLCALC, LDLDIRECT, TRIG, CHOLHDL  Significant Diagnostic Results in last 30 days:  CT ABDOMEN PELVIS W CONTRAST Result Date: 10/06/2023 EXAM: CT ABDOMEN AND PELVIS  WITH CONTRAST 10/06/2023 03:08:42 AM TECHNIQUE: CT of the abdomen and pelvis was performed with the administration of intravenous contrast (75mL iohexol   (OMNIPAQUE ) 350 MG/ML injection). Multiplanar reformatted images are provided for review. Automated exposure control, iterative reconstruction, and/or weight-based adjustment of the mA/kV was utilized to reduce the radiation dose to as low as reasonably achievable. COMPARISON: CT 07/07/2022 CLINICAL HISTORY: Epigastric pain; epigastric pain, bloating, cant eat. FINDINGS: LOWER CHEST: Small right greater than left pleural effusions and compressive atelectasis. LIVER: Hepatic steatosis. Nodular hepatic contour compatible with cirrhosis. GALLBLADDER AND BILE DUCTS: Gallbladder wall thickening favored due to cirrhosis. Biliary stent. Pneumobilia. SPLEEN: No acute abnormality. PANCREAS: Atrophy of the pancreas. No acute abnormality. ADRENAL GLANDS: No acute abnormality. KIDNEYS, URETERS AND BLADDER: Bilateral cortical renal scarring. No stones in the kidneys or ureters. No hydronephrosis. No perinephric or periureteral stranding. Urinary bladder is unremarkable. GI AND BOWEL: Wall thickening and mucosal hyperenhancement of the duodenum and colon in the right upper quadrant. There is no bowel obstruction. PERITONEUM AND RETROPERITONEUM: Nonspecific free fluid in the presacral space and left pelvis are similar to prior. No free intraperitoneal air. VASCULATURE: Aortic atherosclerotic calcification. LYMPH NODES: No lymphadenopathy. REPRODUCTIVE ORGANS: No acute abnormality. BONES AND SOFT TISSUES: Demineralization. chronic compression fractures of L1 and L5. No acute fracture. Small fat-containing ventral abdominal wall hernia. IMPRESSION: 1. Wall thickening and mucosal hyperenhancement of the duodenum and colon in the right upper quadrant favor non specific duodenitis and colitis. 2. Cirrhosis. 3. Gallbladder wall thickening favored due to cirrhosis. Biliary stent and pneumobilia. 4. Small right greater than left pleural effusions and compressive atelectasis. Electronically signed by: Norman Gatlin MD 10/06/2023 03:28 AM  EDT RP Workstation: [id number]    Assessment/Plan  1. Duodenitis (Primary) History of GI bleed -  has epigastric pain, no reported bloody stool -  will start Dicyclomine  20 mg BID -  continue Pepcid  20 mg BID PR -  continue Carafate  1 gm QID  3. Chronic heart failure with preserved ejection fraction (HFpEF) (HCC) -  no shortness of breath -  continue Furosemide  20 mg every other day  4. Hypothyroidism, unspecified type -  0.45, 03/06/23 -  continue Levothyroxine  137 mcg daily    Family/ staff Communication: Discussed plan of care with resident and charge nurse  Labs/tests ordered:  None    Wrenna Saks Medina-Vargas, DNP, MSN, FNP-BC [facility] [phone] (Monday-Friday 8:00 a.m. - 5:00 p.m.) [phone] (after hours)

## 2023-10-06 NOTE — ED Triage Notes (Signed)
 PT arrived from [facility], PT was brought in by [facility]. PT is alert and talking and states he has a history of GERD, PT states he was started on Protonix  and states its not helping. PT states he didn't eat dinner due to this problem. PT VSS

## 2023-10-06 NOTE — Discharge Instructions (Addendum)
 Labs today were all reassuring, normal blood counts.  CT with findings of duodenitis and colitis, this is felt to be inflammatory. Can take prescribed medication as needed for symptomatic control.  Continue your protonix . Follow-up with your primary care doctor. Return to the ED for new or worsening symptoms.

## 2023-10-06 NOTE — ED Provider Notes (Signed)
 [place] [facility]ERGENC[facility] DEPARTMENT AT [facility] Provider Note   CSN: [id number] Arrival date & time: 10/06/23  0115     Patient presents with: Gastroesophageal Reflux   Carl Woods is a 89 y.o. male.   The history is provided by the patient and medical records.  Gastroesophageal Reflux Associated symptoms include abdominal pain.   89 year old male with history of A-fib not currently on anticoagulation, Barrett's esophagus, hypothyroidism, GERD, BPH, fatty liver, iron deficiency anemia, presenting to the ED with abdominal pain.  Patient reports this started around 4 PM today.  Feels it mostly in his upper abdomen.  States he has had some nausea but denies any vomiting.  States he feels bloated.  He was not able to eat any dinner due to his discomfort.  He denies any prior history of bowel obstructions.  He states he does have history of GERD but this feels different.  He denies any specific chest pain.  Prior to Admission medications   Medication Sig Start Date End Date Taking? Authorizing Provider  acetaminophen  (TYLENOL ) 325 MG tablet Take 650 mg by mouth every 4 (four) hours as needed for fever or moderate pain (pain score 4-6).    [provider]  cetirizine (ZYRTEC) 10 MG tablet Take 10 mg by mouth daily as needed for allergies.    [provider]  Cholecalciferol  (VITAMIN D3) 5000 units CAPS Take 5,000 Units by mouth every morning.    [provider]  dapagliflozin  propanediol (FARXIGA ) 5 MG TABS tablet Take 1 tablet (5 mg total) by mouth in the morning. 09/22/22   Ladona Heinz, MD  famotidine  (PEPCID ) 20 MG tablet Take 1 tablet (20 mg total) by mouth 2 (two) times daily as needed for heartburn or indigestion. 09/03/23   Hanford Powell BRAVO, NP  furosemide  (LASIX ) 20 MG tablet Take 20 mg by mouth every other day. Give with KCL    [provider]  Glucosamine 500 MG CAPS Take 1 capsule by mouth every morning.    [provider]   levothyroxine  (SYNTHROID ) 137 MCG tablet Take 1 tablet (137 mcg total) by mouth daily before breakfast. 01/23/23   Fargo, Amy E, NP  nitroGLYCERIN  (NITROSTAT ) 0.4 MG SL tablet Place 1 tablet (0.4 mg total) under the tongue every 5 (five) minutes as needed for chest pain. 07/30/23   Charlanne Fredia CROME, MD  Nutritional Supplements (BOOST PLUS PO) Take 1 Can by mouth once. 1 can orally one time a day for weight loss    [provider]  nystatin  cream (MYCOSTATIN ) Apply 1 Application topically every 8 (eight) hours as needed (Yeast). 12/18/22   [provider]  olopatadine  (PATANOL) 0.1 % ophthalmic solution Place 1 drop into both eyes daily as needed for allergies. 07/08/16   [provider]  ondansetron  (ZOFRAN ) 8 MG tablet Take 1 tablet (8 mg total) by mouth every 8 (eight) hours as needed for nausea or vomiting. 09/25/22   Lanny Callander, MD  pantoprazole  (PROTONIX ) 40 MG tablet Take 1 tablet (40 mg total) by mouth 2 (two) times daily. 07/11/22   Jillian Buttery, MD  potassium chloride  (KLOR-CON ) 10 MEQ tablet Take 10 mEq by mouth every other day. Give with furosemide     [provider]  sodium chloride  (OCEAN) 0.65 % SOLN nasal spray Place 1 spray into both nostrils every 12 (twelve) hours as needed for congestion.    [provider]  sucralfate  (CARAFATE ) 1 g tablet Take 1 g by mouth 4 (four)  times daily.    [provider]  Zinc Oxide 10 % OINT Apply 1 Application topically as needed.    [provider]    Allergies: Nsaids    Review of Systems  Gastrointestinal:  Positive for abdominal pain and nausea.  All other systems reviewed and are negative.   Updated Vital Signs BP (!) 158/72   Pulse (!) 54   Temp 98.1 F (36.7 C)   Resp 18   Ht 5' 9 (1.753 m)   Wt 94.3 kg   SpO2 100%   BMI 30.72 kg/m   Physical Exam Vitals and nursing note reviewed.  Constitutional:      Appearance: He is well-developed.  HENT:     Head:  Normocephalic and atraumatic.  Eyes:     Conjunctiva/sclera: Conjunctivae normal.     Pupils: Pupils are equal, round, and reactive to light.  Cardiovascular:     Rate and Rhythm: Normal rate and regular rhythm.     Heart sounds: Normal heart sounds.  Pulmonary:     Effort: Pulmonary effort is normal.     Breath sounds: Normal breath sounds.  Abdominal:     General: Bowel sounds are normal.     Palpations: Abdomen is soft.     Tenderness: There is abdominal tenderness in the epigastric area.     Comments: Mildly tender in the epigastrium, does appear somewhat distended, decreased bowel sounds  Musculoskeletal:        General: Normal range of motion.     Cervical back: Normal range of motion.  Skin:    General: Skin is warm and dry.  Neurological:     Mental Status: He is alert and oriented to person, place, and time.     (all labs ordered are listed, but only abnormal results are displayed) Labs Reviewed  CBC WITH DIFFERENTIAL/PLATELET - Abnormal; Notable for the following components:      Result Value   RBC 3.02 (*)    Hemoglobin 9.9 (*)    HCT 30.9 (*)    MCV 102.3 (*)    RDW 20.2 (*)    All other components within normal limits  COMPREHENSIVE METABOLIC PANEL WITH GFR - Abnormal; Notable for the following components:   CO2 19 (*)    Glucose, Bld 134 (*)    Creatinine, Ser 1.55 (*)    Calcium  8.5 (*)    Total Protein 6.4 (*)    Albumin 2.9 (*)    GFR, Estimated 42 (*)    All other components within normal limits  LIPASE, BLOOD - Abnormal; Notable for the following components:   Lipase 10 (*)    All other components within normal limits  URINALYSIS, W/ REFLEX TO CULTURE (INFECTION SUSPECTED)  TROPONIN I (HIGH SENSITIVITY)  TROPONIN I (HIGH SENSITIVITY)    EKG: EKG Interpretation Date/Time:  Tuesday October 06 2023 01:55:08 EDT Ventricular Rate:  41 PR Interval:    QRS Duration:  74 QT Interval:  492 QTC Calculation: 407 R Axis:   78  Text  Interpretation: Atrial fibrillation Low voltage, extremity leads Minimal ST depression, diffuse leads Confirmed by Theadore Sharper [phone]) on 10/06/2023 4:02:33 AM  Radiology: CT ABDOMEN PELVIS W CONTRAST Result Date: 10/06/2023 EXAM: CT ABDOMEN AND PELVIS WITH CONTRAST 10/06/2023 03:08:42 AM TECHNIQUE: CT of the abdomen and pelvis was performed with the administration of intravenous contrast (75mL iohexol  (OMNIPAQUE ) 350 MG/ML injection). Multiplanar reformatted images are provided for review. Automated exposure control, iterative reconstruction, and/or weight-based adjustment of the  mA/kV was utilized to reduce the radiation dose to as low as reasonably achievable. COMPARISON: CT 07/07/2022 CLINICAL HISTORY: Epigastric pain; epigastric pain, bloating, cant eat. FINDINGS: LOWER CHEST: Small right greater than left pleural effusions and compressive atelectasis. LIVER: Hepatic steatosis. Nodular hepatic contour compatible with cirrhosis. GALLBLADDER AND BILE DUCTS: Gallbladder wall thickening favored due to cirrhosis. Biliary stent. Pneumobilia. SPLEEN: No acute abnormality. PANCREAS: Atrophy of the pancreas. No acute abnormality. ADRENAL GLANDS: No acute abnormality. KIDNEYS, URETERS AND BLADDER: Bilateral cortical renal scarring. No stones in the kidneys or ureters. No hydronephrosis. No perinephric or periureteral stranding. Urinary bladder is unremarkable. GI AND BOWEL: Wall thickening and mucosal hyperenhancement of the duodenum and colon in the right upper quadrant. There is no bowel obstruction. PERITONEUM AND RETROPERITONEUM: Nonspecific free fluid in the presacral space and left pelvis are similar to prior. No free intraperitoneal air. VASCULATURE: Aortic atherosclerotic calcification. LYMPH NODES: No lymphadenopathy. REPRODUCTIVE ORGANS: No acute abnormality. BONES AND SOFT TISSUES: Demineralization. chronic compression fractures of L1 and L5. No acute fracture. Small fat-containing ventral abdominal wall  hernia. IMPRESSION: 1. Wall thickening and mucosal hyperenhancement of the duodenum and colon in the right upper quadrant favor non specific duodenitis and colitis. 2. Cirrhosis. 3. Gallbladder wall thickening favored due to cirrhosis. Biliary stent and pneumobilia. 4. Small right greater than left pleural effusions and compressive atelectasis. Electronically signed by: Norman Gatlin MD 10/06/2023 03:28 AM EDT RP Workstation: [id number]     Procedures   Medications Ordered in the ED - No data to display                                  Medical Decision Making Amount and/or Complexity of Data Reviewed Labs: ordered. Radiology: ordered and independent interpretation performed. ECG/medicine tests: ordered and independent interpretation performed.  Risk Prescription drug management.   89 year old male presenting to the ED with epigastric abdominal pain.  History of acid reflux but states it feels different.  Did not really want to eat his dinner due to discomfort.  Some nausea without vomiting.  He is afebrile and nontoxic in appearance here.  Does have some mild tenderness in epigastrium without peritoneal signs.  He does report he feels a little bloated currently.  Does have history of biliary stent placement.  Plan for EKG, labs, CT.   EKG with A-fib, this is chronic.  Labs without leukocytosis or significant electrolyte derangement.  Hemoglobin 9.9, similar to prior.  Lipase is normal.  Troponin negative x 2.  CT with findings of nonspecific duodenitis/colitis.  He is not having any frank diarrhea or any recent loose stools.  No fever or chills.  Suspect this may be inflammatory in nature given lack of other infectious symptoms.  4:45 AM Patient feeling better after medications here.  He is tolerating p.o.  He has not had any vomiting here.  Discussed results with patient and son at bedside, they voiced understanding.  They are both comfortable with plan to discharge back to facility.   Son requested copies of labs and imaging be sent with him as his PCP visits him at the facility, just though he will be aware of what is going on.  This is very reasonable.  Will give printed prescriptions as they are medications are filled in house.  Can return here for any new or acute changes.  Shared visit with attending physician, Dr. Theadore, who evaluated patient and agrees with plan of care.  Final diagnoses:  Duodenitis  Colitis    ED Discharge Orders          Ordered    dicyclomine  (BENTYL ) 20 MG tablet  2 times daily        10/06/23 0520    ondansetron  (ZOFRAN -ODT) 4 MG disintegrating tablet  Every 8 hours PRN        10/06/23 0520               Jarold Olam HERO, PA-C 10/06/23 0533    Theadore Ozell HERO, MD 10/06/23 [phone]

## 2023-10-16 ENCOUNTER — Inpatient Hospital Stay

## 2023-10-16 ENCOUNTER — Inpatient Hospital Stay: Attending: Physician Assistant

## 2023-10-16 ENCOUNTER — Encounter: Payer: Self-pay | Admitting: Hematology

## 2023-10-16 ENCOUNTER — Inpatient Hospital Stay (HOSPITAL_BASED_OUTPATIENT_CLINIC_OR_DEPARTMENT_OTHER): Admitting: Nurse Practitioner

## 2023-10-16 VITALS — BP 122/54 | HR 82 | Temp 98.0°F | Resp 18 | Ht 68.0 in | Wt 205.1 lb

## 2023-10-16 DIAGNOSIS — Z5112 Encounter for antineoplastic immunotherapy: Secondary | ICD-10-CM | POA: Diagnosis not present

## 2023-10-16 DIAGNOSIS — D519 Vitamin B12 deficiency anemia, unspecified: Secondary | ICD-10-CM

## 2023-10-16 DIAGNOSIS — K5521 Angiodysplasia of colon with hemorrhage: Secondary | ICD-10-CM

## 2023-10-16 DIAGNOSIS — C241 Malignant neoplasm of ampulla of Vater: Secondary | ICD-10-CM | POA: Diagnosis not present

## 2023-10-16 LAB — CBC WITH DIFFERENTIAL (CANCER CENTER ONLY)
Abs Immature Granulocytes: 0.07 K/uL (ref 0.00–0.07)
Basophils Absolute: 0 K/uL (ref 0.0–0.1)
Basophils Relative: 0 %
Eosinophils Absolute: 0.2 K/uL (ref 0.0–0.5)
Eosinophils Relative: 4 %
HCT: 29.5 % — ABNORMAL LOW (ref 39.0–52.0)
Hemoglobin: 9.7 g/dL — ABNORMAL LOW (ref 13.0–17.0)
Immature Granulocytes: 1 %
Lymphocytes Relative: 19 %
Lymphs Abs: 1.1 K/uL (ref 0.7–4.0)
MCH: 32.7 pg (ref 26.0–34.0)
MCHC: 32.9 g/dL (ref 30.0–36.0)
MCV: 99.3 fL (ref 80.0–100.0)
Monocytes Absolute: 0.7 K/uL (ref 0.1–1.0)
Monocytes Relative: 12 %
Neutro Abs: 3.7 K/uL (ref 1.7–7.7)
Neutrophils Relative %: 64 %
Platelet Count: 193 K/uL (ref 150–400)
RBC: 2.97 MIL/uL — ABNORMAL LOW (ref 4.22–5.81)
RDW: 19.5 % — ABNORMAL HIGH (ref 11.5–15.5)
WBC Count: 5.8 K/uL (ref 4.0–10.5)
nRBC: 0 % (ref 0.0–0.2)

## 2023-10-16 LAB — TOTAL PROTEIN, URINE DIPSTICK: Protein, ur: NEGATIVE mg/dL

## 2023-10-16 MED ORDER — SODIUM CHLORIDE 0.9 % IV SOLN: Freq: Once | INTRAVENOUS | Status: AC

## 2023-10-16 MED ORDER — SODIUM CHLORIDE 0.9 % IV SOLN
5.0000 mg/kg | Freq: Once | INTRAVENOUS | Status: AC
  Administered 2023-10-16: 400 mg via INTRAVENOUS
  Filled 2023-10-16: qty 16

## 2023-10-16 MED ORDER — CYANOCOBALAMIN 1000 MCG/ML IJ SOLN
1000.0000 ug | Freq: Once | INTRAMUSCULAR | Status: AC
  Administered 2023-10-16: 1000 ug via INTRAMUSCULAR
  Filled 2023-10-16: qty 1

## 2023-10-16 NOTE — Patient Instructions (Signed)
 [facility] - A DEPT OF [facility]  Discharge Instructions: Thank you for choosing [facility] to provide your oncology and hematology care.   If you have a lab appointment with the Cancer Center, please go directly to the Cancer Center and check in at the registration area.   Wear comfortable clothing and clothing appropriate for easy access to any Portacath or PICC line.   We strive to give you quality time with your provider. You may need to reschedule your appointment if you arrive late (15 or more minutes).  Arriving late affects you and other patients whose appointments are after yours.  Also, if you miss three or more appointments without notifying the office, you may be dismissed from the clinic at the provider's discretion.      For prescription refill requests, have your pharmacy contact our office and allow 72 hours for refills to be completed.    Today you received the following chemotherapy and/or immunotherapy agents: bevacizumab-adcd      To help prevent nausea and vomiting after your treatment, we encourage you to take your nausea medication as directed.  BELOW ARE SYMPTOMS THAT SHOULD BE REPORTED IMMEDIATELY: *FEVER GREATER THAN 100.4 F (38 C) OR HIGHER *CHILLS OR SWEATING *NAUSEA AND VOMITING THAT IS NOT CONTROLLED WITH YOUR NAUSEA MEDICATION *UNUSUAL SHORTNESS OF BREATH *UNUSUAL BRUISING OR BLEEDING *URINARY PROBLEMS (pain or burning when urinating, or frequent urination) *BOWEL PROBLEMS (unusual diarrhea, constipation, pain near the anus) TENDERNESS IN MOUTH AND THROAT WITH OR WITHOUT PRESENCE OF ULCERS (sore throat, sores in mouth, or a toothache) UNUSUAL RASH, SWELLING OR PAIN  UNUSUAL VAGINAL DISCHARGE OR ITCHING   Items with * indicate a potential emergency and should be followed up as soon as possible or go to the Emergency Department if any problems should occur.  Please show the CHEMOTHERAPY ALERT CARD or  IMMUNOTHERAPY ALERT CARD at check-in to the Emergency Department and triage nurse.  Should you have questions after your visit or need to cancel or reschedule your appointment, please contact [facility] - A DEPT OF [facility]  Dept: [phone]  and follow the prompts.  Office hours are 8:00 a.m. to 4:30 p.m. Monday - Friday. Please note that voicemails left after 4:00 p.m. may not be returned until the following business day.  We are closed weekends and major holidays. You have access to a nurse at all times for urgent questions. Please call the main number to the clinic Dept: [phone] and follow the prompts.   For any non-urgent questions, you may also contact your provider using MyChart. We now offer e-Visits for anyone 94 and older to request care online for non-urgent symptoms. For details visit [email or web address].   Also download the MyChart app! Go to the app store, search "MyChart", open the app, select [place], and log in with your MyChart username and password.

## 2023-10-16 NOTE — Progress Notes (Signed)
 Patient Care Team: Charlanne Fredia CROME, MD as PCP - General (Internal Medicine) Ladona Heinz, MD as PCP - Cardiology (Cardiology) Sheldon Standing, MD as Consulting Physician (General Surgery) Avram Lupita BRAVO, MD as Consulting Physician (Gastroenterology) Jesus Oliphant, MD as Consulting Physician (Otolaryngology) Ladona Heinz, MD as Consulting Physician (Cardiology) Porter Andrez SAUNDERS, PA-C (Inactive) as Physician Assistant (Dermatology) Lanny Callander, MD as Consulting Physician (Oncology)  Clinic Day:  10/16/2023  Referring physician: Lanny Callander, MD  ASSESSMENT & PLAN:   Assessment & Plan: Cancer of ampulla of Vater -cTxN0M0, diagnosed in 12/2021, MMR normal  - PET scan findings reviewed, which showed no evidence of distant metastasis  --his molecular testing was reviewed, MMR was normal, he is not a candidate for immunotherapy.  Foundation One showed no targetable mutations. -He completed RT on 04/09/2022 -Due to advanced age, chemotherapy was not offered -CT scan in June 2024 showed no evidence of residual disease or new metastasis, although CT is not ideal to evaluate the residual disease in his case. -09/03/2023 -currently on bevacizumab  every 6 weeks and tolerating well. - 10/16/2023 -patient continues to tolerate bevacizumab  well.  Receiving treatment every 6 weeks.  Will continue with current plan.   Anemia Stable iron deficiency anemia with Hgb 9.7 and HCT 29.5.  No requirement for blood transfusion today.  Will monitor with each visit and treat as indicated.  Colitis Patient seen in ED on 10/06/2023 due to epigastric abdominal pain.  He did undergo abdomen and pelvis with contrast.  This showed wall thickening and mucosal enhancement in the duodenum: Most consistent with colitis/duodenitis.  He does have cirrhosis.  There is a biliary stent and pneumobilia present.  He was given Pepcid  in the ED which alleviated much of the pain he was experiencing.  This had been added to his medication at his  last visit.  He states that SNF where he lives, this was never added to his medication schedule.  It is now added and patient reports improved GI symptoms.  Plan Labs reviewed. -Mild anemia. -Mild elevation of serum creatinine and decreased eGFR.  Recommend increase water intake. -Urine is negative for presence of protein. Reviewed CT abdomen pelvis done while in ED on 10/06/2023.  Nonspecific colitis/duodenitis diagnosed.  Symptoms have nearly resolved. Labs and patient presentation are appropriate for treatment today. Proceed with bevacizumab  IV. Labs, follow-up, and bevacizumab  in 6 weeks.    The patient understands the plans discussed today and is in agreement with them.  He knows to contact our office if he develops concerns prior to his next appointment.  I provided 25 minutes of face-to-face time during this encounter and > 50% was spent counseling as documented under my assessment and plan.    Powell BRAVO Lessen, NP  [facility] - A DEPT OF [facility] [address] [place] [address] Dept: [phone] Dept Fax: [phone]   No orders of the defined types were placed in this encounter.     CHIEF COMPLAINT:  CC: cancer of ampulla of vater   Current Treatment:  maintenance  bevacizumab  every 6 weeks  INTERVAL HISTORY:  Tad is here today for repeat clinical assessment. He last saw me on 09/03/2023.  He states he was recently seen in the ED because of epigastric and upper abdominal pain.  He was diagnosed with colitis.  Was given Pepcid  in the ED which alleviated his symptoms.  We have added this to his medications during  his most recent visit with me.  He states that SNF where he lives never added to his medication schedule.  It is now added, and he has been getting it twice daily.  He has had no further complaints of abdominal pain.  He states he has been well otherwise.  He denies chest pain, chest pressure,  or shortness of breath. He denies headaches or visual disturbances. He denies abdominal pain, nausea, vomiting, or changes in bowel or bladder habits.  He denies fevers or chills. He denies pain. His appetite is good. His weight has been stable.  I have reviewed the past medical history, past surgical history, social history and family history with the patient and they are unchanged from previous note.  ALLERGIES:  is allergic to nsaids.  MEDICATIONS:  Current Outpatient Medications  Medication Sig Dispense Refill   acetaminophen  (TYLENOL ) 325 MG tablet Take 650 mg by mouth every 4 (four) hours as needed for fever or moderate pain (pain score 4-6).     cetirizine (ZYRTEC) 10 MG tablet Take 10 mg by mouth daily as needed for allergies.     Cholecalciferol  (VITAMIN D3) 5000 units CAPS Take 5,000 Units by mouth every morning.     dapagliflozin  propanediol (FARXIGA ) 5 MG TABS tablet Take 1 tablet (5 mg total) by mouth in the morning.     dicyclomine  (BENTYL ) 10 MG capsule Take 1 capsule (10 mg total) by mouth 2 (two) times daily for 14 days.     famotidine  (PEPCID ) 20 MG tablet Take 1 tablet (20 mg total) by mouth 2 (two) times daily as needed for heartburn or indigestion. 60 tablet 1   furosemide  (LASIX ) 20 MG tablet Take 20 mg by mouth every other day. Give with KCL     Glucosamine 500 MG CAPS Take 1 capsule by mouth every morning.     levothyroxine  (SYNTHROID ) 137 MCG tablet Take 1 tablet (137 mcg total) by mouth daily before breakfast.     nitroGLYCERIN  (NITROSTAT ) 0.4 MG SL tablet Place 1 tablet (0.4 mg total) under the tongue every 5 (five) minutes as needed for chest pain. 50 tablet 3   Nutritional Supplements (BOOST PLUS PO) Take 1 Can by mouth once. 1 can orally one time a day for weight loss     nystatin  cream (MYCOSTATIN ) Apply 1 Application topically every 8 (eight) hours as needed (Yeast).     olopatadine  (PATANOL) 0.1 % ophthalmic solution Place 1 drop into both eyes daily as needed  for allergies.     ondansetron  (ZOFRAN -ODT) 4 MG disintegrating tablet Take 1 tablet (4 mg total) by mouth every 8 (eight) hours as needed. 10 tablet 0   pantoprazole  (PROTONIX ) 40 MG tablet Take 1 tablet (40 mg total) by mouth 2 (two) times daily.     potassium chloride  (KLOR-CON ) 10 MEQ tablet Take 10 mEq by mouth every other day. Give with furosemide      sodium chloride  (OCEAN) 0.65 % SOLN nasal spray Place 1 spray into both nostrils every 12 (twelve) hours as needed for congestion.     sucralfate  (CARAFATE ) 1 g tablet Take 1 g by mouth 4 (four) times daily.     Zinc Oxide 10 % OINT Apply 1 Application topically as needed.     No current facility-administered medications for this visit.    HISTORY OF PRESENT ILLNESS:   Oncology History  Cancer of ampulla of Vater (HCC)  12/06/2021 Imaging   CT ABDOMEN PELVIS W CONTRAST   IMPRESSION: 1. There is  moderate intrahepatic bile duct dilatation with marked fusiform dilatation of the common bile duct. No CT visible common bile duct stones identified. Differential considerations include a obstructing distal common bile duct stone (not visible by CT), distal CBD stricture, or mass at the level of the ampullary. Consider further evaluation with contrast enhanced MRI/MRCP or ERCP. 2. Contour the liver appears nodular which may reflect underlying cirrhosis. 3. Age-indeterminate compression fracture involving L1 vertebral body with loss of 50% of the vertebral body height. No signs of retropulsion of fracture fragments into the canal. 4. Fat containing umbilical hernia. 5. 5 mm anterolisthesis of L4 on L5. 6.  Aortic Atherosclerosis (ICD10-I70.0).   12/16/2021 Imaging   MR ABDOMEN MRCP W WO CONTAST   IMPRESSION: 1. Moderate intrahepatic and extrahepatic bile duct dilation with smooth tapering to the level of the ampulla without filling defect or focal mass lesion identified. Findings may reflect ampullary stricture or occult ampullary  mass. Consider further evaluation with ERCP. 2. Mild dilated main pancreatic duct at the head measuring up to 8 mm. 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 Recommend follow-up pre and post-contrast MRI/MRCP in 2 years. This recommendation follows ACR consensus guidelines: Management of Incidental Pancreatic Cysts: A White Paper of the ACR Incidental Findings Committee. J Am Coll Radiol 2017;14:911-923. 3.  Aortic Atherosclerosis (ICD10-I70.0).   01/23/2022 Procedure   DG ERCP   IMPRESSION: Dilatation of the extrahepatic bile duct with severe tapering or narrowing in the distal common bile duct. Placement of biliary stent.   These images were submitted for radiologic interpretation only. Please see the procedural report for the amount of contrast.    01/23/2022 Procedure   EUS/ERCP  ENDOSONOGRAPHIC FINDING: There was dilation in the common bile duct (12.3 mm -> 20.4 mm) and in the common hepatic duct (23.0 mm). A small amount of hyperechoic material consistent with sludge was visualized endosonographically in the common bile duct. Moderate hyperechoic material consistent with sludge was visualized endosonographically in the gallbladder with normal gallbladder wall thickness. Pancreatic parenchymal abnormalities were noted in the entire pancreas. These consisted of hyperechoic foci with shadowing and cysts. There was a 9.1 mm by 6.1 mm cyst in the neck of the pancreas. There was a 15.3 mm by 14.1 mm cyst in the tail of the pancreas. The pancreatic duct had a dilated endosonographic appearance, had a prominently branched endosonographic appearance and had hyperechoic walls in the pancreatic head (PD - 8.2 mm -> 4.3 mm), genu of the pancreas (3.5 mm), body of the pancreas (3.2 mm) and tail of the pancreas (2.8 mm). A hypoechoic irregular lesion was identified endosonographically at the  ampulla. The lesion measured 15 mm by 13 mm in maximal cross-sectional diameter. The lesion extended from the mucosa to the submucosa. The outer margins were irregular. This is noted where CBD and PD dilation is noted to occur. Endosonographic imaging in the visualized portion of the liver showed no mass.  No malignant-appearing lymph nodes were visualized in the celiac region (level 20), peripancreatic region and porta hepatis region. The celiac region was visualized.   01/23/2022 Pathology Results   SURGICAL PATHOLOGY  CASE: [id number]  PATIENT: Mico Goodwill  Surgical Pathology Report   FINAL MICROSCOPIC DIAGNOSIS:   A. STOMACH, RANDOM, BIOPSY:  - Gastric mucosa, no significant abnormality.  No inflammation,  intestinal metaplasia, dysplasia or malignancy.   B. AMPULLARY LESION, BIOPSY:  - Adenocarcinoma.  See comment.  01/31/2022 Initial Diagnosis   Cancer of ampulla of Vater (HCC)   02/07/2022 Imaging    IMPRESSION: Interval increase in size of lymph node in the anterior cardiophrenic space on the right.   Increase small right pleural effusion compared to the previous MRI.   Subtle nodular tissue along the margin of the right hepatic lobe in the upper abdomen posteriorly. Recommend additional workup when appropriate.   Calcified pleural plaques.   Evidence of chronic liver disease.   Aortic Atherosclerosis (ICD10-I70.0).   02/14/2022 Miscellaneous   Foundation One:  Biomarker Findings Microsatellite status-Cannot be determined Tumor Mutation Burden-Cannot be determined  Genomics Findings EPHB4 amplificatio SF3B1 K666T TP53 R273H        REVIEW OF SYSTEMS:   Constitutional: Denies fevers, chills or abnormal weight loss Eyes: Denies blurriness of vision Ears, nose, mouth, throat, and face: Denies mucositis or sore throat Respiratory: Denies cough, dyspnea or wheezes Cardiovascular: Denies palpitation, chest discomfort or lower extremity  swelling Gastrointestinal:  Denies nausea, heartburn or change in bowel habits. Did have episode of abdominal  and epigastric pain earlier this month. This has nearly resolved.  Skin: Denies abnormal skin rashes Lymphatics: Denies new lymphadenopathy or easy bruising Neurological:Denies numbness, tingling or new weaknesses Behavioral/Psych: Mood is stable, no new changes  All other systems were reviewed with the patient and are negative.   VITALS:   Today's Vitals   10/16/23 1309  BP: (!) 122/54  Pulse: 82  Resp: 18  Temp: 98 F (36.7 C)  TempSrc: Temporal  SpO2: 100%  Weight: 205 lb 1.6 oz (93 kg)  Height: 5' 8 (1.727 m)   Body mass index is 31.19 kg/m.    Wt Readings from Last 3 Encounters:  10/22/23 209 lb (94.8 kg)  10/16/23 205 lb 1.6 oz (93 kg)  10/06/23 209 lb 3.2 oz (94.9 kg)    Body mass index is 31.19 kg/m.  Performance status (ECOG): 1 - Symptomatic but completely ambulatory  PHYSICAL EXAM:   GENERAL:alert, no distress and comfortable SKIN: skin color, texture, turgor are normal, no rashes or significant lesions EYES: normal, Conjunctiva are pink and non-injected, sclera clear OROPHARYNX:no exudate, no erythema and lips, buccal mucosa, and tongue normal  NECK: supple, thyroid  normal size, non-tender, without nodularity LYMPH:  no palpable lymphadenopathy in the cervical, axillary or inguinal LUNGS: clear to auscultation and percussion with normal breathing effort HEART: regular rate & rhythm and no murmurs and no lower extremity edema ABDOMEN:abdomen soft, non-tender and normal bowel sounds Musculoskeletal:no cyanosis of digits and no clubbing  NEURO: alert & oriented x 3 with fluent speech, no focal motor/sensory deficits  LABORATORY DATA:  I have reviewed the data as listed    Component Value Date/Time   NA 138 10/06/2023 0202   NA 141 07/09/2023 0000   K 3.6 10/06/2023 0202   CL 108 10/06/2023 0202   CO2 19 (L) 10/06/2023 0202   GLUCOSE 134  (H) 10/06/2023 0202   BUN 23 10/06/2023 0202   BUN 26 (A) 07/09/2023 0000   CREATININE 1.55 (H) 10/06/2023 0202   CREATININE 1.57 (H) 02/12/2023 1313   CALCIUM  8.5 (L) 10/06/2023 0202   PROT 6.4 (L) 10/06/2023 0202   ALBUMIN 2.9 (L) 10/06/2023 0202   AST 33 10/06/2023 0202   AST 41 02/12/2023 1313   ALT 27 10/06/2023 0202   ALT 37 02/12/2023 1313   ALKPHOS 95 10/06/2023 0202   BILITOT 0.8 10/06/2023 0202   BILITOT 0.4 02/12/2023 1313   GFRNONAA 42 (L)  10/06/2023 0202   GFRNONAA 42 (L) 02/12/2023 1313   GFRAA 55 (L) 02/23/2018 1345    Lab Results  Component Value Date   WBC 5.8 10/16/2023   NEUTROABS 3.7 10/16/2023   HGB 9.7 (L) 10/16/2023   HCT 29.5 (L) 10/16/2023   MCV 99.3 10/16/2023   PLT 193 10/16/2023     RADIOGRAPHIC STUDIES: CT ABDOMEN PELVIS W CONTRAST Result Date: 10/06/2023 EXAM: CT ABDOMEN AND PELVIS WITH CONTRAST 10/06/2023 03:08:42 AM TECHNIQUE: CT of the abdomen and pelvis was performed with the administration of intravenous contrast (75mL iohexol  (OMNIPAQUE ) 350 MG/ML injection). Multiplanar reformatted images are provided for review. Automated exposure control, iterative reconstruction, and/or weight-based adjustment of the mA/kV was utilized to reduce the radiation dose to as low as reasonably achievable. COMPARISON: CT 07/07/2022 CLINICAL HISTORY: Epigastric pain; epigastric pain, bloating, cant eat. FINDINGS: LOWER CHEST: Small right greater than left pleural effusions and compressive atelectasis. LIVER: Hepatic steatosis. Nodular hepatic contour compatible with cirrhosis. GALLBLADDER AND BILE DUCTS: Gallbladder wall thickening favored due to cirrhosis. Biliary stent. Pneumobilia. SPLEEN: No acute abnormality. PANCREAS: Atrophy of the pancreas. No acute abnormality. ADRENAL GLANDS: No acute abnormality. KIDNEYS, URETERS AND BLADDER: Bilateral cortical renal scarring. No stones in the kidneys or ureters. No hydronephrosis. No perinephric or periureteral stranding.  Urinary bladder is unremarkable. GI AND BOWEL: Wall thickening and mucosal hyperenhancement of the duodenum and colon in the right upper quadrant. There is no bowel obstruction. PERITONEUM AND RETROPERITONEUM: Nonspecific free fluid in the presacral space and left pelvis are similar to prior. No free intraperitoneal air. VASCULATURE: Aortic atherosclerotic calcification. LYMPH NODES: No lymphadenopathy. REPRODUCTIVE ORGANS: No acute abnormality. BONES AND SOFT TISSUES: Demineralization. chronic compression fractures of L1 and L5. No acute fracture. Small fat-containing ventral abdominal wall hernia. IMPRESSION: 1. Wall thickening and mucosal hyperenhancement of the duodenum and colon in the right upper quadrant favor non specific duodenitis and colitis. 2. Cirrhosis. 3. Gallbladder wall thickening favored due to cirrhosis. Biliary stent and pneumobilia. 4. Small right greater than left pleural effusions and compressive atelectasis. Electronically signed by: Norman Gatlin MD 10/06/2023 03:28 AM EDT RP Workstation: [id number]

## 2023-10-16 NOTE — Assessment & Plan Note (Addendum)
-  cTxN0M0, diagnosed in 12/2021, MMR normal  - PET scan findings reviewed, which showed no evidence of distant metastasis  --his molecular testing was reviewed, MMR was normal, he is not a candidate for immunotherapy.  Foundation One showed no targetable mutations. -He completed RT on 04/09/2022 -Due to advanced age, chemotherapy was not offered -CT scan in June 2024 showed no evidence of residual disease or new metastasis, although CT is not ideal to evaluate the residual disease in his case. -09/03/2023 -currently on bevacizumab  every 6 weeks and tolerating well. - 10/16/2023 -patient continues to tolerate bevacizumab  well.  Receiving treatment every 6 weeks.  Will continue with current plan.

## 2023-10-19 ENCOUNTER — Telehealth: Payer: Self-pay | Admitting: Hematology

## 2023-10-19 ENCOUNTER — Other Ambulatory Visit: Payer: Self-pay | Admitting: Hematology

## 2023-10-19 DIAGNOSIS — K5521 Angiodysplasia of colon with hemorrhage: Secondary | ICD-10-CM

## 2023-10-19 NOTE — Telephone Encounter (Signed)
 Scheduled patient appointments. Called and spoke with the patient, he is aware.

## 2023-10-20 ENCOUNTER — Encounter: Payer: Self-pay | Admitting: Pharmacist

## 2023-10-20 ENCOUNTER — Other Ambulatory Visit: Payer: Self-pay

## 2023-10-20 NOTE — Progress Notes (Signed)
   10/20/2023  Patient ID: Carl Woods, male   DOB: May 28, 1931, 89 y.o.   MRN: [id number]  Pharmacy Quality Measure Review  This patient is appearing on a report for being at risk of failing the adherence measure for diabetes medications this calendar year.   Medication: Farxiga  5 mg   Last fill date: 09/23/23 for 30 day supply  Insurance report was not up to date. No action needed at this time.   Patient is a nursing home resident.  He gets his medications filled at [facility]. They are a long term care pharmacy. They do weekly pill packs and monthly composite billing.   Carl Woods, PharmD, BCACP Clinical Pharmacist [phone]

## 2023-10-21 DIAGNOSIS — G4733 Obstructive sleep apnea (adult) (pediatric): Secondary | ICD-10-CM | POA: Diagnosis not present

## 2023-10-22 ENCOUNTER — Non-Acute Institutional Stay: Payer: Self-pay | Admitting: Internal Medicine

## 2023-10-22 ENCOUNTER — Encounter: Payer: Self-pay | Admitting: Internal Medicine

## 2023-10-22 DIAGNOSIS — C241 Malignant neoplasm of ampulla of Vater: Secondary | ICD-10-CM | POA: Diagnosis not present

## 2023-10-22 DIAGNOSIS — N1832 Chronic kidney disease, stage 3b: Secondary | ICD-10-CM | POA: Diagnosis not present

## 2023-10-22 DIAGNOSIS — E039 Hypothyroidism, unspecified: Secondary | ICD-10-CM | POA: Diagnosis not present

## 2023-10-22 DIAGNOSIS — G4733 Obstructive sleep apnea (adult) (pediatric): Secondary | ICD-10-CM | POA: Diagnosis not present

## 2023-10-22 DIAGNOSIS — I4821 Permanent atrial fibrillation: Secondary | ICD-10-CM

## 2023-10-22 DIAGNOSIS — I10 Essential (primary) hypertension: Secondary | ICD-10-CM

## 2023-10-22 DIAGNOSIS — Q2730 Arteriovenous malformation, site unspecified: Secondary | ICD-10-CM

## 2023-10-22 DIAGNOSIS — I5032 Chronic diastolic (congestive) heart failure: Secondary | ICD-10-CM | POA: Diagnosis not present

## 2023-10-22 MED ORDER — DICYCLOMINE HCL 10 MG PO CAPS: 10.0000 mg | ORAL_CAPSULE | Freq: Two times a day (BID) | ORAL | Status: DC

## 2023-10-22 NOTE — Progress Notes (Signed)
 Location:  [facility] of Service:  ALF ([id number])  Provider:   Code Status: DNR Goals of Care:     10/06/2023    1:21 AM  Advanced Directives  Does Patient Have a Medical Advance Directive? No  Would patient like information on creating a medical advance directive? No - Patient declined     Chief Complaint  Patient presents with   Care Management    HPI: Patient is a 89 y.o. male seen today for medical management of chronic diseases.   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 On  Bevacizumab  Q 6 weeks per Oncology Also H/o A Fib Not on DOAC due to GI bleed Not watchman candidate either HTN, Chronic Heart Failure on Farxiga  CKD   Recently Went to ED for abdominal Pain Was found to have Duodenitis and Colitis  Conservative management He says he is doing fine with no abdominal Pain  No Falls Weight is stable  Has h/o Sleep Apnea on CPAP  Past Medical History:  Diagnosis Date   Anal fistula    Arthritis    Fingers and hands   Atrial fibrillation (HCC)    AVM (arteriovenous malformation)    Clipped during Colonoscopy 05/2016   Barrett's esophagus    Bilateral cataracts    BPH (benign prostatic hyperplasia)    Cecal angiodysplasia 05/28/2016   ablated at colonoscopy   Deviated nasal septum    Diverticulosis of sigmoid colon    E. 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   Left Forehead (treatment after biopsy)   OSA on CPAP    Perianal rash    Recurrent epistaxis    Renal cyst 01/04/2013   Small left peripelvic renal cysts , noted on US  Renal   SCCA (squamous cell carcinoma) of skin 10/17/2015    Left Sup Bridge of Nose (curet, cautery and 5FU)   Seasonal allergies    Superficial basal cell carcinoma (BCC) 10/17/2015   Left Bulb of Nose (curet, cautery and 5FU)   Tubular adenoma     Past Surgical History:  Procedure Laterality Date   BILIARY STENT PLACEMENT N/A 01/23/2022   Procedure: BILIARY STENT PLACEMENT;  Surgeon: Wilhelmenia Aloha Raddle., MD;  Location: [facility];  Service: Gastroenterology;  Laterality: N/A;   BILIARY STENT PLACEMENT N/A 08/06/2022   Procedure: BILIARY STENT PLACEMENT;  Surgeon: Charlanne Groom, MD;  Location: [facility];  Service: Gastroenterology;  Laterality: N/A;   BIOPSY  01/23/2022   Procedure: BIOPSY;  Surgeon: Wilhelmenia Aloha Raddle., MD;  Location: [facility];  Service: Gastroenterology;;   BIOPSY  07/09/2022   Procedure: BIOPSY;  Surgeon: San Sandor GAILS, DO;  Location: [facility];  Service: Gastroenterology;;   BIOPSY  08/02/2022   Procedure: BIOPSY;  Surgeon: Legrand Victory LITTIE DOUGLAS, MD;  Location: [facility];  Service: Gastroenterology;;   COLONOSCOPY  multiple   CYSTOSCOPY     ENDOSCOPIC RETROGRADE CHOLANGIOPANCREATOGRAPHY (ERCP) WITH PROPOFOL  N/A 01/23/2022   Procedure: ENDOSCOPIC RETROGRADE CHOLANGIOPANCREATOGRAPHY (ERCP) WITH PROPOFOL ;  Surgeon: Wilhelmenia Aloha Raddle., MD;  Location: [facility];  Service: Gastroenterology;  Laterality:  N/A;   ENTEROSCOPY N/A 08/02/2022   Procedure: ENTEROSCOPY;  Surgeon: Legrand Victory LITTIE DOUGLAS, MD;  Location: [facility];  Service: Gastroenterology;  Laterality: N/A;   ERCP N/A 08/06/2022   Procedure: ENDOSCOPIC RETROGRADE CHOLANGIOPANCREATOGRAPHY (ERCP);  Surgeon: Charlanne Groom, MD;  Location: [facility];  Service: Gastroenterology;  Laterality: N/A;   ESOPHAGOGASTRODUODENOSCOPY  multiple   ESOPHAGOGASTRODUODENOSCOPY N/A 01/23/2022   Procedure: ESOPHAGOGASTRODUODENOSCOPY (EGD);  Surgeon: Wilhelmenia Aloha Raddle., MD;  Location: [facility];  Service: Gastroenterology;  Laterality: N/A;    ESOPHAGOGASTRODUODENOSCOPY N/A 07/09/2022   Procedure: ESOPHAGOGASTRODUODENOSCOPY (EGD);  Surgeon: San Sandor GAILS, DO;  Location: [facility];  Service: Gastroenterology;  Laterality: N/A;   ESOPHAGOGASTRODUODENOSCOPY N/A 08/21/2022   Procedure: ESOPHAGOGASTRODUODENOSCOPY (EGD);  Surgeon: Stacia Glendia BRAVO, MD;  Location: [facility];  Service: Gastroenterology;  Laterality: N/A;   EUS N/A 01/23/2022   Procedure: UPPER ENDOSCOPIC ULTRASOUND (EUS) RADIAL;  Surgeon: Wilhelmenia Aloha Raddle., MD;  Location: [facility];  Service: Gastroenterology;  Laterality: N/A;   EVALUATION UNDER ANESTHESIA WITH FISTULECTOMY N/A 03/02/2018   Procedure: ANORECTAL EXAM UNDER ANESTHESIA WITH REPAIR OF SUPERFICIAL PERIRECTAL FISTULA AND HEMORRHOIDECTOMY;  Surgeon: Sheldon Standing, MD;  Location: [facility];  Service: General;  Laterality: N/A;   EXPLORATORY LAPAROTOMY     HEMOSTASIS CLIP PLACEMENT  07/09/2022   Procedure: HEMOSTASIS CLIP PLACEMENT;  Surgeon: San Sandor GAILS, DO;  Location: [facility];  Service: Gastroenterology;;   HOT HEMOSTASIS N/A 08/02/2022   Procedure: HOT HEMOSTASIS (ARGON PLASMA COAGULATION/BICAP);  Surgeon: Legrand Victory LITTIE DOUGLAS, MD;  Location: [facility];  Service: Gastroenterology;  Laterality: N/A;   HOT HEMOSTASIS N/A 08/21/2022   Procedure: HOT HEMOSTASIS (ARGON PLASMA COAGULATION/BICAP);  Surgeon: Stacia Glendia BRAVO, MD;  Location: [facility];  Service: Gastroenterology;  Laterality: N/A;   IR THORACENTESIS ASP PLEURAL SPACE W/IMG GUIDE  07/10/2022   REMOVAL OF STONES  01/23/2022   Procedure: REMOVAL OF STONES;  Surgeon: Wilhelmenia Aloha Raddle., MD;  Location: [facility];  Service: Gastroenterology;;   REMOVAL OF STONES  08/06/2022   Procedure: REMOVAL OF STONES;  Surgeon: Charlanne Groom, MD;  Location: [facility];  Service: Gastroenterology;;   ANNETT  01/23/2022   Procedure: ANNETT;  Surgeon: Wilhelmenia Aloha Raddle., MD;  Location: [facility];  Service:  Gastroenterology;;    Allergies  Allergen Reactions   Nsaids Other (See Comments)    Patient is to not take these because of kidney issues    Outpatient Encounter Medications as of 10/22/2023  Medication Sig   acetaminophen  (TYLENOL ) 325 MG tablet Take 650 mg by mouth every 4 (four) hours as needed for fever or moderate pain (pain score 4-6).   cetirizine (ZYRTEC) 10 MG tablet Take 10 mg by mouth daily as needed for allergies.   Cholecalciferol  (VITAMIN D3) 5000 units CAPS Take 5,000 Units by mouth every morning.   dapagliflozin  propanediol (FARXIGA ) 5 MG TABS tablet Take 1 tablet (5 mg total) by mouth in the morning.   dicyclomine  (BENTYL ) 20 MG tablet Take 1 tablet (20 mg total) by mouth 2 (two) times daily.   famotidine  (PEPCID ) 20 MG tablet Take 1 tablet (20 mg total) by mouth 2 (two) times daily as needed for heartburn or indigestion.   furosemide  (LASIX ) 20 MG tablet Take 20 mg by mouth every other day. Give with KCL   Glucosamine 500 MG CAPS Take 1 capsule by mouth every morning.   levothyroxine  (SYNTHROID ) 137 MCG tablet Take 1 tablet (137 mcg total) by mouth daily before breakfast.   nitroGLYCERIN  (NITROSTAT ) 0.4 MG SL  tablet Place 1 tablet (0.4 mg total) under the tongue every 5 (five) minutes as needed for chest pain.   Nutritional Supplements (BOOST PLUS PO) Take 1 Can by mouth once. 1 can orally one time a day for weight loss   nystatin  cream (MYCOSTATIN ) Apply 1 Application topically every 8 (eight) hours as needed (Yeast).   olopatadine  (PATANOL) 0.1 % ophthalmic solution Place 1 drop into both eyes daily as needed for allergies.   ondansetron  (ZOFRAN ) 8 MG tablet Take 1 tablet (8 mg total) by mouth every 8 (eight) hours as needed for nausea or vomiting.   ondansetron  (ZOFRAN -ODT) 4 MG disintegrating tablet Take 1 tablet (4 mg total) by mouth every 8 (eight) hours as needed.   pantoprazole  (PROTONIX ) 40 MG tablet Take 1 tablet (40 mg total) by mouth 2 (two) times daily.    potassium chloride  (KLOR-CON ) 10 MEQ tablet Take 10 mEq by mouth every other day. Give with furosemide    sodium chloride  (OCEAN) 0.65 % SOLN nasal spray Place 1 spray into both nostrils every 12 (twelve) hours as needed for congestion.   sucralfate  (CARAFATE ) 1 g tablet Take 1 g by mouth 4 (four) times daily.   Zinc Oxide 10 % OINT Apply 1 Application topically as needed.   No facility-administered encounter medications on file as of 10/22/2023.    Review of Systems:  Review of Systems  Constitutional:  Negative for activity change, appetite change and unexpected weight change.  HENT: Negative.    Respiratory:  Negative for cough and shortness of breath.   Cardiovascular:  Positive for leg swelling.  Gastrointestinal:  Negative for constipation.  Genitourinary:  Negative for frequency.  Musculoskeletal:  Positive for gait problem. Negative for arthralgias and myalgias.  Skin: Negative.  Negative for rash.  Neurological:  Negative for dizziness and weakness.  Psychiatric/Behavioral:  Negative for confusion and sleep disturbance.   All other systems reviewed and are negative.   Health Maintenance  Topic Date Due   DTaP/Tdap/Td (1 - Tdap) Never done   Pneumococcal Vaccine: 50+ Years (2 of 2 - PCV) 06/22/2019   Influenza Vaccine  08/28/2023   COVID-19 Vaccine (5 - 2025-26 season) 09/28/2023   Zoster Vaccines- Shingrix  Completed   HPV VACCINES  Aged Out   Meningococcal B Vaccine  Aged Out    Physical Exam: There were no vitals filed for this visit. There is no height or weight on file to calculate BMI. Physical Exam Vitals reviewed.  Constitutional:      Appearance: Normal appearance.  HENT:     Head: Normocephalic.     Nose: Nose normal.     Mouth/Throat:     Mouth: Mucous membranes are moist.     Pharynx: Oropharynx is clear.  Eyes:     Pupils: Pupils are equal, round, and reactive to light.  Cardiovascular:     Rate and Rhythm: Normal rate. Rhythm irregular.      Pulses: Normal pulses.     Heart sounds: No murmur heard. Pulmonary:     Effort: Pulmonary effort is normal. No respiratory distress.     Breath sounds: Normal breath sounds. No rales.  Abdominal:     General: Abdomen is flat. Bowel sounds are normal.     Palpations: Abdomen is soft.  Musculoskeletal:        General: Swelling present.     Cervical back: Neck supple.     Comments: Let more then Right  Skin:    General: Skin is warm.  Neurological:     General: No focal deficit present.     Mental Status: He is alert and oriented to person, place, and time.  Psychiatric:        Mood and Affect: Mood normal.        Thought Content: Thought content normal.     Labs reviewed: Basic Metabolic Panel: Recent Labs    12/08/22 0000 12/18/22 1222 01/19/23 0000 02/12/23 1313 06/11/23 1251 07/09/23 0000 09/03/23 1233 10/06/23 0202  NA  --    < >  --    < > 143 141 142 138  K  --    < >  --    < > 3.5 3.5 3.7 3.6  CL  --    < >  --    < > 111 109* 111 108  CO2  --    < >  --    < > 24 23* 24 19*  GLUCOSE  --    < >  --    < > 89  --  85 134*  BUN  --    < >  --    < > 26* 26* 29* 23  CREATININE  --    < >  --    < > 1.64* 1.5* 1.50* 1.55*  CALCIUM   --    < >  --    < > 8.6* 8.2* 8.7* 8.5*  TSH 0.05*  --  0.14*  --   --   --   --   --    < > = values in this interval not displayed.   Liver Function Tests: Recent Labs    06/11/23 1251 09/03/23 1233 10/06/23 0202  AST 27 38 33  ALT 21 42 27  ALKPHOS 96 119 95  BILITOT 0.5 0.4 0.8  PROT 6.8 6.6 6.4*  ALBUMIN 3.5 3.4* 2.9*   Recent Labs    10/06/23 0202  LIPASE 10*   No results for input(s): AMMONIA in the last 8760 hours. CBC: Recent Labs    09/03/23 1233 10/06/23 0202 10/16/23 1235  WBC 5.2 7.3 5.8  NEUTROABS 3.1 5.6 3.7  HGB 10.0* 9.9* 9.7*  HCT 30.5* 30.9* 29.5*  MCV 98.7 102.3* 99.3  PLT 140* 150 193   Lipid Panel: No results for input(s): CHOL, HDL, LDLCALC, TRIG, CHOLHDL, LDLDIRECT in  the last 8760 hours. Lab Results  Component Value Date   HGBA1C 4.4 (L) 08/02/2022    Procedures since last visit: CT ABDOMEN PELVIS W CONTRAST Result Date: 10/06/2023 EXAM: CT ABDOMEN AND PELVIS WITH CONTRAST 10/06/2023 03:08:42 AM TECHNIQUE: CT of the abdomen and pelvis was performed with the administration of intravenous contrast (75mL iohexol  (OMNIPAQUE ) 350 MG/ML injection). Multiplanar reformatted images are provided for review. Automated exposure control, iterative reconstruction, and/or weight-based adjustment of the mA/kV was utilized to reduce the radiation dose to as low as reasonably achievable. COMPARISON: CT 07/07/2022 CLINICAL HISTORY: Epigastric pain; epigastric pain, bloating, cant eat. FINDINGS: LOWER CHEST: Small right greater than left pleural effusions and compressive atelectasis. LIVER: Hepatic steatosis. Nodular hepatic contour compatible with cirrhosis. GALLBLADDER AND BILE DUCTS: Gallbladder wall thickening favored due to cirrhosis. Biliary stent. Pneumobilia. SPLEEN: No acute abnormality. PANCREAS: Atrophy of the pancreas. No acute abnormality. ADRENAL GLANDS: No acute abnormality. KIDNEYS, URETERS AND BLADDER: Bilateral cortical renal scarring. No stones in the kidneys or ureters. No hydronephrosis. No perinephric or periureteral stranding. Urinary bladder is unremarkable. GI AND BOWEL: Wall thickening and mucosal hyperenhancement of the duodenum  and colon in the right upper quadrant. There is no bowel obstruction. PERITONEUM AND RETROPERITONEUM: Nonspecific free fluid in the presacral space and left pelvis are similar to prior. No free intraperitoneal air. VASCULATURE: Aortic atherosclerotic calcification. LYMPH NODES: No lymphadenopathy. REPRODUCTIVE ORGANS: No acute abnormality. BONES AND SOFT TISSUES: Demineralization. chronic compression fractures of L1 and L5. No acute fracture. Small fat-containing ventral abdominal wall hernia. IMPRESSION: 1. Wall thickening and mucosal  hyperenhancement of the duodenum and colon in the right upper quadrant favor non specific duodenitis and colitis. 2. Cirrhosis. 3. Gallbladder wall thickening favored due to cirrhosis. Biliary stent and pneumobilia. 4. Small right greater than left pleural effusions and compressive atelectasis. Electronically signed by: Norman Gatlin MD 10/06/2023 03:28 AM EDT RP Workstation: [id number]    Assessment/Plan 1. Chronic heart failure with preserved ejection fraction (HFpEF) (HCC) (Primary) Last Echo showed EF of 60%  Doing well with Farxiga  and Lasix  every other day Creat is stable  2. Hypothyroidism, unspecified type TSH normal in 02/25  3. Cancer of ampulla of Vater (HCC) In Remission  4. AVM (arteriovenous malformation) Bevacizumab  Q 6 weeks per Oncolog   5. Permanent atrial fibrillation (HCC) No DOAC due to Bleeding risk   6. Essential hypertension No Meds BP high recently Will Follow every day for 2 weeks and reval  7. Stage 3b chronic kidney disease (HCC) Creat stable  8. OSA (obstructive sleep apnea) CPAP 9 Duodenitis Resolved symptoms Reduce Bentyl  to 10 mg BID and then stop after 2 weeks and use it PRn     Labs/tests ordered:  * No order type specified * Next appt:  Visit date not found

## 2023-10-27 ENCOUNTER — Encounter: Payer: Self-pay | Admitting: Nurse Practitioner

## 2023-10-27 ENCOUNTER — Encounter: Payer: Self-pay | Admitting: Hematology

## 2023-11-09 ENCOUNTER — Ambulatory Visit: Admitting: Nurse Practitioner

## 2023-11-09 DIAGNOSIS — R7989 Other specified abnormal findings of blood chemistry: Secondary | ICD-10-CM | POA: Diagnosis not present

## 2023-11-09 DIAGNOSIS — I10 Essential (primary) hypertension: Secondary | ICD-10-CM | POA: Diagnosis not present

## 2023-11-11 ENCOUNTER — Telehealth: Payer: Self-pay

## 2023-11-11 NOTE — Telephone Encounter (Unsigned)
 Copied from CRM [id number]. Topic: Clinical - Order For Equipment >> Nov 09, 2023  8:39 AM Carl Woods wrote: Reason for CRM: pt states he has not received a new mask yet for his cpap, so nothing different to see the dr about.  He is calling to cancel appt for today.  Please follow up on a new mask for pt, and then after he gets new mask, how long until he needs to follow up?  Because he does not know.   [name] wanted him to try a new mask, but he never received.

## 2023-11-12 ENCOUNTER — Encounter: Payer: Self-pay | Admitting: Pharmacist

## 2023-11-12 NOTE — Telephone Encounter (Signed)
 I have re sent the order as urgent. Order was originally received 8/14

## 2023-11-12 NOTE — Progress Notes (Signed)
   11/12/2023  Patient ID: Carl Woods, male   DOB: 04-Mar-1931, 89 y.o.   MRN: [id number]  This patient is appearing on a report for being at risk of failing the adherence measure for diabetes medications this calendar year.   Medication: Farxiga  5 mg                    Last fill date: 10/22/23 for 30 day supply  Has been filled monthly. Will most likely be filled again.    Insurance report was not up to date. No action needed at this time.    Patient is a nursing home resident.  He gets his medications filled at [facility]. They are a long term care pharmacy. They do weekly pill packs and monthly composite billing.  Impact date 12/05/23     Carl Woods, PharmD, BCACP Clinical Pharmacist [phone]

## 2023-11-13 NOTE — Telephone Encounter (Signed)
 Per Brad at [employer]-This order was received 09/14/2023 317pm by our snap  ( resupply) team. However, the patient was not eligible for supplies at that time.   I will ask them to review ASAP for the new supply request.

## 2023-11-20 DIAGNOSIS — G4733 Obstructive sleep apnea (adult) (pediatric): Secondary | ICD-10-CM | POA: Diagnosis not present

## 2023-11-25 NOTE — Assessment & Plan Note (Signed)
-  cTxN0M0, diagnosed in 12/2021, MMR normal  -I reviewed PET scan findings, which showed no evidence of distant metastasis  --I reviewed his molecular testing, MMR was normal, he is not a candidate for immunotherapy.  Foundation One showed no targetable mutations. -He completed RT on 04/09/2022 -Due to advanced age, I do not plan to offer him chemotherapy -CT scan in June 2024 showed no evidence of residual disease or new metastasis, although CT is not ideal to evaluate the residual disease in his case.

## 2023-11-25 NOTE — Assessment & Plan Note (Signed)
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  -He has required multiple blood transfusions -We discussed the benefit and side effect of bevacizumab  for AVM related GI bleeding, he started on 09/11/22, he received every 2 weeks for total of 4 cycles. -due to good response to beva and persistent anemia, will continue beva every 4-6 weeks as maintenance therapy

## 2023-11-26 ENCOUNTER — Inpatient Hospital Stay

## 2023-11-26 ENCOUNTER — Inpatient Hospital Stay (HOSPITAL_BASED_OUTPATIENT_CLINIC_OR_DEPARTMENT_OTHER): Admitting: Hematology

## 2023-11-26 ENCOUNTER — Encounter: Payer: Self-pay | Admitting: Hematology

## 2023-11-26 ENCOUNTER — Inpatient Hospital Stay: Attending: Physician Assistant

## 2023-11-26 VITALS — BP 136/58 | HR 73 | Temp 97.7°F | Resp 16 | Ht 68.0 in | Wt 203.0 lb

## 2023-11-26 DIAGNOSIS — D500 Iron deficiency anemia secondary to blood loss (chronic): Secondary | ICD-10-CM

## 2023-11-26 DIAGNOSIS — K922 Gastrointestinal hemorrhage, unspecified: Secondary | ICD-10-CM | POA: Diagnosis not present

## 2023-11-26 DIAGNOSIS — C241 Malignant neoplasm of ampulla of Vater: Secondary | ICD-10-CM

## 2023-11-26 DIAGNOSIS — K5521 Angiodysplasia of colon with hemorrhage: Secondary | ICD-10-CM

## 2023-11-26 DIAGNOSIS — Z5112 Encounter for antineoplastic immunotherapy: Secondary | ICD-10-CM | POA: Insufficient documentation

## 2023-11-26 DIAGNOSIS — D519 Vitamin B12 deficiency anemia, unspecified: Secondary | ICD-10-CM

## 2023-11-26 LAB — CBC WITH DIFFERENTIAL (CANCER CENTER ONLY)
Abs Immature Granulocytes: 0.02 K/uL (ref 0.00–0.07)
Basophils Absolute: 0 K/uL (ref 0.0–0.1)
Basophils Relative: 0 %
Eosinophils Absolute: 0.1 K/uL (ref 0.0–0.5)
Eosinophils Relative: 3 %
HCT: 30 % — ABNORMAL LOW (ref 39.0–52.0)
Hemoglobin: 10 g/dL — ABNORMAL LOW (ref 13.0–17.0)
Immature Granulocytes: 0 %
Lymphocytes Relative: 26 %
Lymphs Abs: 1.3 K/uL (ref 0.7–4.0)
MCH: 33.1 pg (ref 26.0–34.0)
MCHC: 33.3 g/dL (ref 30.0–36.0)
MCV: 99.3 fL (ref 80.0–100.0)
Monocytes Absolute: 0.6 K/uL (ref 0.1–1.0)
Monocytes Relative: 12 %
Neutro Abs: 2.9 K/uL (ref 1.7–7.7)
Neutrophils Relative %: 59 %
Platelet Count: 120 K/uL — ABNORMAL LOW (ref 150–400)
RBC: 3.02 MIL/uL — ABNORMAL LOW (ref 4.22–5.81)
RDW: 19.5 % — ABNORMAL HIGH (ref 11.5–15.5)
WBC Count: 4.9 K/uL (ref 4.0–10.5)
nRBC: 0 % (ref 0.0–0.2)

## 2023-11-26 LAB — TOTAL PROTEIN, URINE DIPSTICK: Protein, ur: 30 mg/dL — AB

## 2023-11-26 MED ORDER — CYANOCOBALAMIN 1000 MCG/ML IJ SOLN
1000.0000 ug | Freq: Once | INTRAMUSCULAR | Status: AC
  Administered 2023-11-26: 1000 ug via INTRAMUSCULAR
  Filled 2023-11-26: qty 1

## 2023-11-26 MED ORDER — SODIUM CHLORIDE 0.9 % IV SOLN: Freq: Once | INTRAVENOUS | Status: AC

## 2023-11-26 MED ORDER — SODIUM CHLORIDE 0.9 % IV SOLN
5.0000 mg/kg | Freq: Once | INTRAVENOUS | Status: AC
  Administered 2023-11-26: 400 mg via INTRAVENOUS
  Filled 2023-11-26: qty 16

## 2023-11-26 NOTE — Patient Instructions (Signed)
 [facility] - A DEPT OF [facility]  Discharge Instructions: Thank you for choosing [facility] to provide your oncology and hematology care.   If you have a lab appointment with the Cancer Center, please go directly to the Cancer Center and check in at the registration area.   Wear comfortable clothing and clothing appropriate for easy access to any Portacath or PICC line.   We strive to give you quality time with your provider. You may need to reschedule your appointment if you arrive late (15 or more minutes).  Arriving late affects you and other patients whose appointments are after yours.  Also, if you miss three or more appointments without notifying the office, you may be dismissed from the clinic at the provider's discretion.      For prescription refill requests, have your pharmacy contact our office and allow 72 hours for refills to be completed.    Today you received the following chemotherapy and/or immunotherapy agents: bevacizumab-adcd      To help prevent nausea and vomiting after your treatment, we encourage you to take your nausea medication as directed.  BELOW ARE SYMPTOMS THAT SHOULD BE REPORTED IMMEDIATELY: *FEVER GREATER THAN 100.4 F (38 C) OR HIGHER *CHILLS OR SWEATING *NAUSEA AND VOMITING THAT IS NOT CONTROLLED WITH YOUR NAUSEA MEDICATION *UNUSUAL SHORTNESS OF BREATH *UNUSUAL BRUISING OR BLEEDING *URINARY PROBLEMS (pain or burning when urinating, or frequent urination) *BOWEL PROBLEMS (unusual diarrhea, constipation, pain near the anus) TENDERNESS IN MOUTH AND THROAT WITH OR WITHOUT PRESENCE OF ULCERS (sore throat, sores in mouth, or a toothache) UNUSUAL RASH, SWELLING OR PAIN  UNUSUAL VAGINAL DISCHARGE OR ITCHING   Items with * indicate a potential emergency and should be followed up as soon as possible or go to the Emergency Department if any problems should occur.  Please show the CHEMOTHERAPY ALERT CARD or  IMMUNOTHERAPY ALERT CARD at check-in to the Emergency Department and triage nurse.  Should you have questions after your visit or need to cancel or reschedule your appointment, please contact [facility] - A DEPT OF [facility]  Dept: [phone]  and follow the prompts.  Office hours are 8:00 a.m. to 4:30 p.m. Monday - Friday. Please note that voicemails left after 4:00 p.m. may not be returned until the following business day.  We are closed weekends and major holidays. You have access to a nurse at all times for urgent questions. Please call the main number to the clinic Dept: [phone] and follow the prompts.   For any non-urgent questions, you may also contact your provider using MyChart. We now offer e-Visits for anyone 94 and older to request care online for non-urgent symptoms. For details visit [email or web address].   Also download the MyChart app! Go to the app store, search "MyChart", open the app, select [place], and log in with your MyChart username and password.

## 2023-11-26 NOTE — Progress Notes (Signed)
 [facility]   [phone] Fax:(336) [phone]   Clinic Follow up Note   Patient Care Team: Charlanne Fredia CROME, MD as PCP - General (Internal Medicine) Ladona Heinz, MD as PCP - Cardiology (Cardiology) Sheldon Standing, MD as Consulting Physician (General Surgery) Avram Lupita BRAVO, MD as Consulting Physician (Gastroenterology) Jesus Oliphant, MD as Consulting Physician (Otolaryngology) Ladona Heinz, MD as Consulting Physician (Cardiology) Porter Andrez SAUNDERS, PA-C (Inactive) as Physician Assistant (Dermatology) Lanny Callander, MD as Consulting Physician (Oncology)  Date of Service:  11/26/2023  CHIEF COMPLAINT: f/u of anemia   CURRENT THERAPY:  Maintenance bevacizumab  every 6 weeks  Oncology History   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  Foundation One showed no targetable mutations. -He completed RT on 04/09/2022 -Due to advanced age, I do not plan to offer him chemotherapy -CT scan in June 2024 showed no evidence of residual disease or new metastasis, although CT is not ideal to evaluate the residual disease in his case.        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  -He has required multiple blood transfusions -We discussed the benefit and side effect of bevacizumab  for AVM related GI bleeding, he started on 09/11/22, he received every 2 weeks for total of 4 cycles. -due to good response to beva and persistent anemia, will continue beva every 4-6 weeks as maintenance therapy   Assessment & Plan Iron deficiency anemia due to chronic gastrointestinal blood loss Anemia secondary to  chronic gastrointestinal bleeding is well-managed with current treatment. Hemoglobin level is 10, indicating no need for transfusion. No symptoms of gastrointestinal bleeding such as pain, nausea, or vomiting. He is eating and drinking well without weight loss. - Continue current medication regimen for anemia management. - Scheduled follow-up appointment in six weeks.  Mild thrombocytopenia Platelet count is slightly low but not concerning at this time. No symptoms or complications related to thrombocytopenia. - Continue to monitor platelet count.  Plan - Lab reviewed, hemoglobin 10.0, overall stable.  Clinically doing well without any complaints. - Will proceed to bevacizumab  infusion today and continue every 6 weeks - Follow-up in 6 weeks.   SUMMARY OF ONCOLOGIC HISTORY: Oncology History  Cancer of ampulla of Vater (HCC)  12/06/2021 Imaging   CT ABDOMEN PELVIS W CONTRAST   IMPRESSION: 1. There is moderate intrahepatic bile duct dilatation with marked fusiform dilatation of the common bile duct. No CT visible common bile duct stones identified. Differential considerations include a obstructing distal common bile duct stone (not visible by CT), distal CBD stricture, or mass at the level of the ampullary. Consider further evaluation with contrast enhanced MRI/MRCP or ERCP. 2. Contour the liver appears nodular which may reflect underlying cirrhosis. 3. Age-indeterminate compression fracture involving L1 vertebral body with loss of 50% of the vertebral body height. No signs of retropulsion of fracture fragments into the canal. 4. Fat containing umbilical hernia. 5. 5 mm anterolisthesis of L4 on L5. 6.  Aortic Atherosclerosis (ICD10-I70.0).   12/16/2021 Imaging   MR ABDOMEN MRCP W WO CONTAST   IMPRESSION: 1. Moderate intrahepatic and extrahepatic bile duct dilation with smooth tapering to the  level of the ampulla without filling defect or focal mass lesion identified. Findings  may reflect ampullary stricture or occult ampullary mass. Consider further evaluation with ERCP. 2. Mild dilated main pancreatic duct at the head measuring up to 8 mm. 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 Recommend follow-up pre and post-contrast MRI/MRCP in 2 years. This recommendation follows ACR consensus guidelines: Management of Incidental Pancreatic Cysts: A White Paper of the ACR Incidental Findings Committee. J Am Coll Radiol 2017;14:911-923. 3.  Aortic Atherosclerosis (ICD10-I70.0).   01/23/2022 Procedure   DG ERCP   IMPRESSION: Dilatation of the extrahepatic bile duct with severe tapering or narrowing in the distal common bile duct. Placement of biliary stent.   These images were submitted for radiologic interpretation only. Please see the procedural report for the amount of contrast.    01/23/2022 Procedure   EUS/ERCP  ENDOSONOGRAPHIC FINDING: There was dilation in the common bile duct (12.3 mm -> 20.4 mm) and in the common hepatic duct (23.0 mm). A small amount of hyperechoic material consistent with sludge was visualized endosonographically in the common bile duct. Moderate hyperechoic material consistent with sludge was visualized endosonographically in the gallbladder with normal gallbladder wall thickness. Pancreatic parenchymal abnormalities were noted in the entire pancreas. These consisted of hyperechoic foci with shadowing and cysts. There was a 9.1 mm by 6.1 mm cyst in the neck of the pancreas. There was a 15.3 mm by 14.1 mm cyst in the tail of the pancreas. The pancreatic duct had a dilated endosonographic appearance, had a prominently branched endosonographic appearance and had hyperechoic walls in the pancreatic head (PD - 8.2 mm -> 4.3 mm), genu of the pancreas (3.5 mm), body of the pancreas (3.2 mm) and tail of the pancreas (2.8 mm). A hypoechoic  irregular lesion was identified endosonographically at the ampulla. The lesion measured 15 mm by 13 mm in maximal cross-sectional diameter. The lesion extended from the mucosa to the submucosa. The outer margins were irregular. This is noted where CBD and PD dilation is noted to occur. Endosonographic imaging in the visualized portion of the liver showed no mass.  No malignant-appearing lymph nodes were visualized in the celiac region (level 20), peripancreatic region and porta hepatis region. The celiac region was visualized.   01/23/2022 Pathology Results   SURGICAL PATHOLOGY  CASE: [id number]  PATIENT: Juancarlos Bourquin  Surgical Pathology Report   FINAL MICROSCOPIC DIAGNOSIS:   A. STOMACH, RANDOM, BIOPSY:  - Gastric mucosa, no significant abnormality.  No inflammation,  intestinal metaplasia, dysplasia or malignancy.   B. AMPULLARY LESION, BIOPSY:  - Adenocarcinoma.  See comment.    01/31/2022 Initial Diagnosis   Cancer of ampulla of Vater (HCC)   02/07/2022 Imaging    IMPRESSION: Interval increase in size of lymph node in the anterior cardiophrenic space on the right.   Increase small right pleural effusion compared to the previous MRI.   Subtle nodular tissue along the margin of the right hepatic lobe in the upper abdomen posteriorly. Recommend additional workup when appropriate.   Calcified pleural plaques.   Evidence of chronic liver disease.   Aortic Atherosclerosis (ICD10-I70.0).   02/14/2022 Miscellaneous   Foundation One:  Biomarker Findings Microsatellite status-Cannot be determined Tumor Mutation Burden-Cannot be determined  Genomics Findings EPHB4 amplificatio SF3B1 K666T TP53 R273H       Discussed the use of AI scribe software for clinical note transcription with the patient, who gave verbal consent  to proceed.  History of Present Illness GOPAL MALTER is a 89 year old male with anemia from GI bleeding who presents for follow-up. He was referred  by the nursing home for follow-up.  He has anemia secondary to gastrointestinal bleeding, managed with medication. His hemoglobin is 10 g/dL, and he does not require a blood transfusion. He experiences no stomach pain, tenderness, nausea, vomiting, or weight loss. His energy level is normal, and he maintains a good appetite.  He has a history of ampullary cancer in the duodenum but is currently asymptomatic with no pain, nausea, or vomiting. He is eating and drinking well, with no weight loss.  He presents with mild thrombocytopenia, indicated by a low platelet count, which is being monitored. The condition is mild and not currently concerning.     All other systems were reviewed with the patient and are negative.  MEDICAL HISTORY:  Past Medical History:  Diagnosis Date   Anal fistula    Arthritis    Fingers and hands   Atrial fibrillation (HCC)    AVM (arteriovenous malformation)    Clipped during Colonoscopy 05/2016   Barrett's esophagus    Bilateral cataracts    BPH (benign prostatic hyperplasia)    Cecal angiodysplasia 05/28/2016   ablated at colonoscopy   Deviated nasal septum    Diverticulosis of sigmoid colon    E. 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   Left Forehead (treatment after biopsy)   OSA on CPAP    Perianal rash    Recurrent epistaxis    Renal cyst 01/04/2013   Small left peripelvic renal cysts , noted on US  Renal   SCCA (squamous cell carcinoma) of skin 10/17/2015   Left Sup Bridge of Nose (curet, cautery and 5FU)   Seasonal allergies    Superficial basal cell carcinoma (BCC) 10/17/2015   Left Bulb of Nose (curet, cautery and 5FU)   Tubular adenoma     SURGICAL HISTORY: Past Surgical History:  Procedure Laterality Date   BILIARY STENT PLACEMENT N/A 01/23/2022   Procedure: BILIARY STENT  PLACEMENT;  Surgeon: Wilhelmenia Aloha Raddle., MD;  Location: [facility];  Service: Gastroenterology;  Laterality: N/A;   BILIARY STENT PLACEMENT N/A 08/06/2022   Procedure: BILIARY STENT PLACEMENT;  Surgeon: Charlanne Groom, MD;  Location: [facility];  Service: Gastroenterology;  Laterality: N/A;   BIOPSY  01/23/2022   Procedure: BIOPSY;  Surgeon: Wilhelmenia Aloha Raddle., MD;  Location: [facility];  Service: Gastroenterology;;   BIOPSY  07/09/2022   Procedure: BIOPSY;  Surgeon: San Sandor GAILS, DO;  Location: [facility];  Service: Gastroenterology;;   BIOPSY  08/02/2022   Procedure: BIOPSY;  Surgeon: Legrand Victory LITTIE DOUGLAS, MD;  Location: [facility];  Service: Gastroenterology;;   COLONOSCOPY  multiple   CYSTOSCOPY     ENDOSCOPIC RETROGRADE CHOLANGIOPANCREATOGRAPHY (ERCP) WITH PROPOFOL  N/A 01/23/2022   Procedure: ENDOSCOPIC RETROGRADE CHOLANGIOPANCREATOGRAPHY (ERCP) WITH PROPOFOL ;  Surgeon: Wilhelmenia Aloha Raddle., MD;  Location: [facility];  Service: Gastroenterology;  Laterality: N/A;   ENTEROSCOPY N/A 08/02/2022   Procedure: ENTEROSCOPY;  Surgeon: Legrand Victory LITTIE DOUGLAS, MD;  Location: [facility];  Service: Gastroenterology;  Laterality: N/A;   ERCP N/A 08/06/2022   Procedure: ENDOSCOPIC RETROGRADE CHOLANGIOPANCREATOGRAPHY (ERCP);  Surgeon: Charlanne Groom, MD;  Location: [facility];  Service: Gastroenterology;  Laterality: N/A;  ESOPHAGOGASTRODUODENOSCOPY  multiple   ESOPHAGOGASTRODUODENOSCOPY N/A 01/23/2022   Procedure: ESOPHAGOGASTRODUODENOSCOPY (EGD);  Surgeon: Wilhelmenia Aloha Raddle., MD;  Location: [facility];  Service: Gastroenterology;  Laterality: N/A;   ESOPHAGOGASTRODUODENOSCOPY N/A 07/09/2022   Procedure: ESOPHAGOGASTRODUODENOSCOPY (EGD);  Surgeon: San Sandor GAILS, DO;  Location: [facility];  Service: Gastroenterology;  Laterality: N/A;   ESOPHAGOGASTRODUODENOSCOPY N/A 08/21/2022   Procedure: ESOPHAGOGASTRODUODENOSCOPY (EGD);  Surgeon: Stacia Glendia BRAVO, MD;  Location:  [facility]COPY;  Service: Gastroenterology;  Laterality: N/A;   EUS N/A 01/23/2022   Procedure: UPPER ENDOSCOPIC ULTRASOUND (EUS) RADIAL;  Surgeon: Wilhelmenia Aloha Raddle., MD;  Location: [facility];  Service: Gastroenterology;  Laterality: N/A;   EVALUATION UNDER ANESTHESIA WITH FISTULECTOMY N/A 03/02/2018   Procedure: ANORECTAL EXAM UNDER ANESTHESIA WITH REPAIR OF SUPERFICIAL PERIRECTAL FISTULA AND HEMORRHOIDECTOMY;  Surgeon: Sheldon Standing, MD;  Location: [facility];  Service: General;  Laterality: N/A;   EXPLORATORY LAPAROTOMY     HEMOSTASIS CLIP PLACEMENT  07/09/2022   Procedure: HEMOSTASIS CLIP PLACEMENT;  Surgeon: San Sandor GAILS, DO;  Location: [facility];  Service: Gastroenterology;;   HOT HEMOSTASIS N/A 08/02/2022   Procedure: HOT HEMOSTASIS (ARGON PLASMA COAGULATION/BICAP);  Surgeon: Legrand Victory LITTIE DOUGLAS, MD;  Location: [facility];  Service: Gastroenterology;  Laterality: N/A;   HOT HEMOSTASIS N/A 08/21/2022   Procedure: HOT HEMOSTASIS (ARGON PLASMA COAGULATION/BICAP);  Surgeon: Stacia Glendia BRAVO, MD;  Location: [facility];  Service: Gastroenterology;  Laterality: N/A;   IR THORACENTESIS ASP PLEURAL SPACE W/IMG GUIDE  07/10/2022   REMOVAL OF STONES  01/23/2022   Procedure: REMOVAL OF STONES;  Surgeon: Wilhelmenia Aloha Raddle., MD;  Location: [facility];  Service: Gastroenterology;;   REMOVAL OF STONES  08/06/2022   Procedure: REMOVAL OF STONES;  Surgeon: Charlanne Groom, MD;  Location: [facility];  Service: Gastroenterology;;   ANNETT  01/23/2022   Procedure: ANNETT;  Surgeon: Mansouraty, Aloha Raddle., MD;  Location: [facility];  Service: Gastroenterology;;    I have reviewed the social history and family history with the patient and they are unchanged from previous note.  ALLERGIES:  is allergic to nsaids.  MEDICATIONS:  Current Outpatient Medications  Medication Sig Dispense Refill   acetaminophen  (TYLENOL ) 325 MG tablet Take 650 mg by mouth every 4 (four) hours  as needed for fever or moderate pain (pain score 4-6).     cetirizine (ZYRTEC) 10 MG tablet Take 10 mg by mouth daily as needed for allergies.     Cholecalciferol  (VITAMIN D3) 5000 units CAPS Take 5,000 Units by mouth every morning.     dapagliflozin  propanediol (FARXIGA ) 5 MG TABS tablet Take 1 tablet (5 mg total) by mouth in the morning.     dicyclomine  (BENTYL ) 10 MG capsule Take 1 capsule (10 mg total) by mouth 2 (two) times daily for 14 days.     famotidine  (PEPCID ) 20 MG tablet Take 1 tablet (20 mg total) by mouth 2 (two) times daily as needed for heartburn or indigestion. 60 tablet 1   furosemide  (LASIX ) 20 MG tablet Take 20 mg by mouth every other day. Give with KCL     Glucosamine 500 MG CAPS Take 1 capsule by mouth every morning.     levothyroxine  (SYNTHROID ) 137 MCG tablet Take 1 tablet (137 mcg total) by mouth daily before breakfast.     nitroGLYCERIN  (NITROSTAT ) 0.4 MG SL tablet Place 1 tablet (0.4 mg total) under the tongue every 5 (five) minutes as needed for chest pain. 50 tablet 3   Nutritional Supplements (BOOST PLUS PO) Take 1 Can  by mouth once. 1 can orally one time a day for weight loss     nystatin  cream (MYCOSTATIN ) Apply 1 Application topically every 8 (eight) hours as needed (Yeast).     olopatadine  (PATANOL) 0.1 % ophthalmic solution Place 1 drop into both eyes daily as needed for allergies.     ondansetron  (ZOFRAN -ODT) 4 MG disintegrating tablet Take 1 tablet (4 mg total) by mouth every 8 (eight) hours as needed. 10 tablet 0   pantoprazole  (PROTONIX ) 40 MG tablet Take 1 tablet (40 mg total) by mouth 2 (two) times daily.     potassium chloride  (KLOR-CON ) 10 MEQ tablet Take 10 mEq by mouth every other day. Give with furosemide      sodium chloride  (OCEAN) 0.65 % SOLN nasal spray Place 1 spray into both nostrils every 12 (twelve) hours as needed for congestion.     sucralfate  (CARAFATE ) 1 g tablet Take 1 g by mouth 4 (four) times daily.     Zinc Oxide 10 % OINT Apply 1  Application topically as needed.     No current facility-administered medications for this visit.    PHYSICAL EXAMINATION: ECOG PERFORMANCE STATUS: 2 - Symptomatic, <50% confined to bed  Vitals:   11/26/23 1100 11/26/23 1114  BP: (!) 146/61 (!) 136/58  Pulse: 73   Resp: 16   Temp: 97.7 F (36.5 C)   SpO2: 97%    Wt Readings from Last 3 Encounters:  11/26/23 203 lb (92.1 kg)  10/22/23 209 lb (94.8 kg)  10/16/23 205 lb 1.6 oz (93 kg)     GENERAL:alert, no distress and comfortable SKIN: skin color, texture, turgor are normal, no rashes or significant lesions EYES: normal, Conjunctiva are pink and non-injected, sclera clear NECK: supple, thyroid  normal size, non-tender, without nodularity LYMPH:  no palpable lymphadenopathy in the cervical, axillary  LUNGS: clear to auscultation and percussion with normal breathing effort HEART: regular rate & rhythm and no murmurs and no lower extremity edema ABDOMEN:abdomen soft, non-tender and normal bowel sounds Musculoskeletal:no cyanosis of digits and no clubbing  NEURO: alert & oriented x 3 with fluent speech, no focal motor/sensory deficits  Physical Exam   LABORATORY DATA:  I have reviewed the data as listed    Latest Ref Rng & Units 11/26/2023   10:31 AM 10/16/2023   12:35 PM 10/06/2023    2:02 AM  CBC  WBC 4.0 - 10.5 K/uL 4.9  5.8  7.3   Hemoglobin 13.0 - 17.0 g/dL 89.9  9.7  9.9   Hematocrit 39.0 - 52.0 % 30.0  29.5  30.9   Platelets 150 - 400 K/uL 120  193  150         Latest Ref Rng & Units 10/06/2023    2:02 AM 09/03/2023   12:33 PM 07/09/2023   12:00 AM  CMP  Glucose 70 - 99 mg/dL 865  85    BUN 8 - 23 mg/dL 23  29  26       Creatinine 0.61 - 1.24 mg/dL 8.44  8.49  1.5      Sodium 135 - 145 mmol/L 138  142  141      Potassium 3.5 - 5.1 mmol/L 3.6  3.7  3.5      Chloride 98 - 111 mmol/L 108  111  109      CO2 22 - 32 mmol/L 19  24  23       Calcium  8.9 - 10.3 mg/dL 8.5  8.7  8.2  Total Protein 6.5 - 8.1 g/dL 6.4   6.6    Total Bilirubin 0.0 - 1.2 mg/dL 0.8  0.4    Alkaline Phos 38 - 126 U/L 95  119    AST 15 - 41 U/L 33  38    ALT 0 - 44 U/L 27  42       This result is from an external source.      RADIOGRAPHIC STUDIES: I have personally reviewed the radiological images as listed and agreed with the findings in the report. No results found.    No orders of the defined types were placed in this encounter.  All questions were answered. The patient knows to call the clinic with any problems, questions or concerns. No barriers to learning was detected. The total time spent in the appointment was 25 minutes, including review of chart and various tests results, discussions about plan of care and coordination of care plan     Onita Mattock, MD 11/26/2023

## 2023-11-27 ENCOUNTER — Other Ambulatory Visit: Payer: Self-pay

## 2023-11-27 LAB — CANCER ANTIGEN 19-9: CA 19-9: 16 U/mL (ref 0–35)

## 2023-12-17 ENCOUNTER — Encounter: Payer: Self-pay | Admitting: Pharmacist

## 2023-12-17 NOTE — Progress Notes (Signed)
   12/17/2023  Patient ID: Carl Woods Pelt, male   DOB: Aug 19, 1931, 89 y.o.   MRN: [id number]  This patient is appearing on a report for being at risk of failing the adherence measure for diabetes medications this calendar year.   Medication: Farxiga  5 mg                    Last fill date: 11/22/23 or 30 day supply   Has been filled monthly. Will most likely be filled again.      Patient is a nursing home resident.  He gets his medications filled at [facility]. They are a long term care pharmacy. They do weekly pill packs and monthly composite billing.  [name], PharmD, BCACP Clinical Pharmacist [phone]

## 2023-12-21 DIAGNOSIS — G4733 Obstructive sleep apnea (adult) (pediatric): Secondary | ICD-10-CM | POA: Diagnosis not present

## 2023-12-24 ENCOUNTER — Other Ambulatory Visit: Payer: Self-pay

## 2023-12-28 ENCOUNTER — Other Ambulatory Visit: Payer: Self-pay

## 2024-01-05 ENCOUNTER — Other Ambulatory Visit: Payer: Self-pay

## 2024-01-07 ENCOUNTER — Inpatient Hospital Stay: Attending: Physician Assistant

## 2024-01-07 ENCOUNTER — Inpatient Hospital Stay: Admitting: Hematology

## 2024-01-07 VITALS — BP 119/53 | HR 65 | Temp 98.1°F | Resp 17 | Wt 202.4 lb

## 2024-01-07 VITALS — BP 142/56 | HR 62 | Temp 97.7°F | Resp 16

## 2024-01-07 DIAGNOSIS — Z5112 Encounter for antineoplastic immunotherapy: Secondary | ICD-10-CM | POA: Insufficient documentation

## 2024-01-07 DIAGNOSIS — D519 Vitamin B12 deficiency anemia, unspecified: Secondary | ICD-10-CM | POA: Diagnosis not present

## 2024-01-07 DIAGNOSIS — C241 Malignant neoplasm of ampulla of Vater: Secondary | ICD-10-CM | POA: Diagnosis not present

## 2024-01-07 DIAGNOSIS — K5521 Angiodysplasia of colon with hemorrhage: Secondary | ICD-10-CM

## 2024-01-07 DIAGNOSIS — D500 Iron deficiency anemia secondary to blood loss (chronic): Secondary | ICD-10-CM

## 2024-01-07 LAB — CBC WITH DIFFERENTIAL (CANCER CENTER ONLY)
Abs Immature Granulocytes: 0.03 K/uL (ref 0.00–0.07)
Basophils Absolute: 0 K/uL (ref 0.0–0.1)
Basophils Relative: 0 %
Eosinophils Absolute: 0.1 K/uL (ref 0.0–0.5)
Eosinophils Relative: 2 %
HCT: 31.3 % — ABNORMAL LOW (ref 39.0–52.0)
Hemoglobin: 10.2 g/dL — ABNORMAL LOW (ref 13.0–17.0)
Immature Granulocytes: 1 %
Lymphocytes Relative: 28 %
Lymphs Abs: 1.7 K/uL (ref 0.7–4.0)
MCH: 32.8 pg (ref 26.0–34.0)
MCHC: 32.6 g/dL (ref 30.0–36.0)
MCV: 100.6 fL — ABNORMAL HIGH (ref 80.0–100.0)
Monocytes Absolute: 0.7 K/uL (ref 0.1–1.0)
Monocytes Relative: 12 %
Neutro Abs: 3.4 K/uL (ref 1.7–7.7)
Neutrophils Relative %: 57 %
Platelet Count: 145 K/uL — ABNORMAL LOW (ref 150–400)
RBC: 3.11 MIL/uL — ABNORMAL LOW (ref 4.22–5.81)
RDW: 19.6 % — ABNORMAL HIGH (ref 11.5–15.5)
WBC Count: 6 K/uL (ref 4.0–10.5)
nRBC: 0 % (ref 0.0–0.2)

## 2024-01-07 LAB — TOTAL PROTEIN, URINE DIPSTICK: Protein, ur: 100 mg/dL — AB

## 2024-01-07 MED ORDER — SODIUM CHLORIDE 0.9 % IV SOLN: Freq: Once | INTRAVENOUS | Status: AC

## 2024-01-07 MED ORDER — CYANOCOBALAMIN 1000 MCG/ML IJ SOLN
1000.0000 ug | Freq: Once | INTRAMUSCULAR | Status: AC
  Administered 2024-01-07: 1000 ug via INTRAMUSCULAR
  Filled 2024-01-07: qty 1

## 2024-01-07 MED ORDER — SODIUM CHLORIDE 0.9 % IV SOLN
5.0000 mg/kg | Freq: Once | INTRAVENOUS | Status: AC
  Administered 2024-01-07: 400 mg via INTRAVENOUS
  Filled 2024-01-07: qty 16

## 2024-01-07 NOTE — Assessment & Plan Note (Signed)
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  -He has required multiple blood transfusions -We discussed the benefit and side effect of bevacizumab  for AVM related GI bleeding, he started on 09/11/22, he received every 2 weeks for total of 4 cycles. -due to good response to beva and persistent anemia, will continue beva every 4-6 weeks as maintenance therapy

## 2024-01-07 NOTE — Patient Instructions (Signed)
 [facility] - A DEPT OF [facility]  Discharge Instructions: Thank you for choosing [facility] to provide your oncology and hematology care.   If you have a lab appointment with the Cancer Center, please go directly to the Cancer Center and check in at the registration area.   Wear comfortable clothing and clothing appropriate for easy access to any Portacath or PICC line.   We strive to give you quality time with your provider. You may need to reschedule your appointment if you arrive late (15 or more minutes).  Arriving late affects you and other patients whose appointments are after yours.  Also, if you miss three or more appointments without notifying the office, you may be dismissed from the clinic at the provider's discretion.      For prescription refill requests, have your pharmacy contact our office and allow 72 hours for refills to be completed.    Today you received the following chemotherapy and/or immunotherapy agents: bevacizumab-adcd      To help prevent nausea and vomiting after your treatment, we encourage you to take your nausea medication as directed.  BELOW ARE SYMPTOMS THAT SHOULD BE REPORTED IMMEDIATELY: *FEVER GREATER THAN 100.4 F (38 C) OR HIGHER *CHILLS OR SWEATING *NAUSEA AND VOMITING THAT IS NOT CONTROLLED WITH YOUR NAUSEA MEDICATION *UNUSUAL SHORTNESS OF BREATH *UNUSUAL BRUISING OR BLEEDING *URINARY PROBLEMS (pain or burning when urinating, or frequent urination) *BOWEL PROBLEMS (unusual diarrhea, constipation, pain near the anus) TENDERNESS IN MOUTH AND THROAT WITH OR WITHOUT PRESENCE OF ULCERS (sore throat, sores in mouth, or a toothache) UNUSUAL RASH, SWELLING OR PAIN  UNUSUAL VAGINAL DISCHARGE OR ITCHING   Items with * indicate a potential emergency and should be followed up as soon as possible or go to the Emergency Department if any problems should occur.  Please show the CHEMOTHERAPY ALERT CARD or  IMMUNOTHERAPY ALERT CARD at check-in to the Emergency Department and triage nurse.  Should you have questions after your visit or need to cancel or reschedule your appointment, please contact [facility] - A DEPT OF [facility]  Dept: [phone]  and follow the prompts.  Office hours are 8:00 a.m. to 4:30 p.m. Monday - Friday. Please note that voicemails left after 4:00 p.m. may not be returned until the following business day.  We are closed weekends and major holidays. You have access to a nurse at all times for urgent questions. Please call the main number to the clinic Dept: [phone] and follow the prompts.   For any non-urgent questions, you may also contact your provider using MyChart. We now offer e-Visits for anyone 94 and older to request care online for non-urgent symptoms. For details visit [email or web address].   Also download the MyChart app! Go to the app store, search "MyChart", open the app, select [place], and log in with your MyChart username and password.

## 2024-01-07 NOTE — Progress Notes (Signed)
 [facility]   Telephone:([phone] Fax:([phone]   Clinic Follow up Note   Patient Care Team: Charlanne Fredia CROME, MD as PCP - General (Internal Medicine) Ladona Heinz, MD as PCP - Cardiology (Cardiology) Sheldon Standing, MD as Consulting Physician (General Surgery) Avram Lupita BRAVO, MD as Consulting Physician (Gastroenterology) Jesus Oliphant, MD as Consulting Physician (Otolaryngology) Ladona Heinz, MD as Consulting Physician (Cardiology) Porter Andrez SAUNDERS, PA-C (Inactive) as Physician Assistant (Dermatology) Lanny Callander, MD as Consulting Physician (Oncology)  Date of Service:  01/07/2024  CHIEF COMPLAINT: f/u of iron deficient anemia  CURRENT THERAPY:  Bevacizumab  every 6 weeks  Oncology History   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  Foundation One showed no targetable mutations. -He completed RT on 04/09/2022 -Due to advanced age, I do not plan to offer him chemotherapy -CT scan in June 2024 showed no evidence of residual disease or new metastasis, although CT is not ideal to evaluate the residual disease in his case.        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  -He has required multiple blood transfusions -We discussed the benefit and side effect of bevacizumab  for AVM related GI bleeding, he started on 09/11/22, he received every 2 weeks for total of 4 cycles. 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 He  remains clinically stable with no new symptoms, no evidence of disease progression, and no gastrointestinal complaints. He maintains functional status.  - Continued bevacizumab  infusions as scheduled. - Next infusion scheduled for January 22nd; subsequent infusion scheduled for March 5th.  Iron deficiency anemia due to chronic gastrointestinal blood loss Anemia is well-managed with a recent hemoglobin of 10.2 g/dL. No episodes of overt gastrointestinal bleeding, melena, or hematochezia since last visit. Anemia management includes bevacizumab  to reduce further gastrointestinal blood loss. - Assessed hemoglobin level and symptoms of bleeding; none reported. - Continued bevacizumab  infusion to prevent further gastrointestinal blood loss.  Plan - Lab reviewed, hemoglobin 10.2, overall stable, no overt GI bleeding. - Will proceed to bevacizumab  today and continue every 6 weeks - Follow-up in 6 weeks   SUMMARY OF ONCOLOGIC HISTORY: Oncology History  Cancer of ampulla of Vater (HCC)  12/06/2021 Imaging   CT ABDOMEN PELVIS W CONTRAST   IMPRESSION: 1. There is moderate intrahepatic bile duct dilatation with marked fusiform dilatation of the common bile duct. No CT visible common bile duct stones identified. Differential considerations include a obstructing distal common bile duct stone (not visible by CT), distal CBD stricture, or mass at the level of the ampullary. Consider further evaluation with contrast enhanced MRI/MRCP or ERCP. 2. Contour the liver appears nodular which may reflect underlying cirrhosis. 3. Age-indeterminate compression fracture involving L1 vertebral body with loss of 50% of the vertebral body height. No signs of retropulsion of fracture fragments into the canal. 4. Fat containing umbilical hernia. 5. 5 mm anterolisthesis of L4 on L5. 6.  Aortic Atherosclerosis (ICD10-I70.0).   12/16/2021 Imaging   MR ABDOMEN MRCP  W WO CONTAST   IMPRESSION: 1. Moderate  intrahepatic and extrahepatic bile duct dilation with smooth tapering to the level of the ampulla without filling defect or focal mass lesion identified. Findings may reflect ampullary stricture or occult ampullary mass. Consider further evaluation with ERCP. 2. Mild dilated main pancreatic duct at the head measuring up to 8 mm. 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 Recommend follow-up pre and post-contrast MRI/MRCP in 2 years. This recommendation follows ACR consensus guidelines: Management of Incidental Pancreatic Cysts: A White Paper of the ACR Incidental Findings Committee. J Am Coll Radiol 2017;14:911-923. 3.  Aortic Atherosclerosis (ICD10-I70.0).   01/23/2022 Procedure   DG ERCP   IMPRESSION: Dilatation of the extrahepatic bile duct with severe tapering or narrowing in the distal common bile duct. Placement of biliary stent.   These images were submitted for radiologic interpretation only. Please see the procedural report for the amount of contrast.    01/23/2022 Procedure   EUS/ERCP  ENDOSONOGRAPHIC FINDING: There was dilation in the common bile duct (12.3 mm -> 20.4 mm) and in the common hepatic duct (23.0 mm). A small amount of hyperechoic material consistent with sludge was visualized endosonographically in the common bile duct. Moderate hyperechoic material consistent with sludge was visualized endosonographically in the gallbladder with normal gallbladder wall thickness. Pancreatic parenchymal abnormalities were noted in the entire pancreas. These consisted of hyperechoic foci with shadowing and cysts. There was a 9.1 mm by 6.1 mm cyst in the neck of the pancreas. There was a 15.3 mm by 14.1 mm cyst in the tail of the pancreas. The pancreatic duct had a dilated endosonographic appearance, had a prominently branched endosonographic appearance and had hyperechoic walls in  the pancreatic head (PD - 8.2 mm -> 4.3 mm), genu of the pancreas (3.5 mm), body of the pancreas (3.2 mm) and tail of the pancreas (2.8 mm). A hypoechoic irregular lesion was identified endosonographically at the ampulla. The lesion measured 15 mm by 13 mm in maximal cross-sectional diameter. The lesion extended from the mucosa to the submucosa. The outer margins were irregular. This is noted where CBD and PD dilation is noted to occur. Endosonographic imaging in the visualized portion of the liver showed no mass.  No malignant-appearing lymph nodes were visualized in the celiac region (level 20), peripancreatic region and porta hepatis region. The celiac region was visualized.   01/23/2022 Pathology Results   SURGICAL PATHOLOGY  CASE: [id number]  PATIENT: Stacy Witt  Surgical Pathology Report   FINAL MICROSCOPIC DIAGNOSIS:   A. STOMACH, RANDOM, BIOPSY:  - Gastric mucosa, no significant abnormality.  No inflammation,  intestinal metaplasia, dysplasia or malignancy.   B. AMPULLARY LESION, BIOPSY:  - Adenocarcinoma.  See comment.    01/31/2022 Initial Diagnosis   Cancer of ampulla of Vater (HCC)   02/07/2022 Imaging    IMPRESSION: Interval increase in size of lymph node in the anterior cardiophrenic space on the right.   Increase small right pleural effusion compared to the previous MRI.   Subtle nodular tissue along the margin of the right hepatic lobe in the upper abdomen posteriorly. Recommend additional workup when appropriate.   Calcified pleural plaques.   Evidence of chronic liver disease.   Aortic Atherosclerosis (ICD10-I70.0).   02/14/2022 Miscellaneous   Foundation One:  Biomarker Findings Microsatellite status-Cannot be determined Tumor Mutation Burden-Cannot be determined  Genomics Findings EPHB4 amplificatio SF3B1 K666T TP53 R273H  Discussed the use of AI scribe software for clinical note transcription with the patient, who gave verbal  consent to proceed.  History of Present Illness Carl Woods is a 89 year old male with ampulla of Vater carcinoma and iron deficiency anemia who presents for oncology follow-up to assess disease and anemia status.  He has had no recent gastrointestinal bleeding, hematochezia, or melena. He does not recall any significant bleeding episodes. His most recent hemoglobin was 10.2 g/dL.  He feels well without new symptoms, including no dysphagia, odynophagia, or sensation of food or pills lodging. Bowel movements are regular without gastrointestinal complaints.  He resides in a care facility with access to his wife in the same community and has family support.     All other systems were reviewed with the patient and are negative.  MEDICAL HISTORY:  Past Medical History:  Diagnosis Date   Anal fistula    Arthritis    Fingers and hands   Atrial fibrillation (HCC)    AVM (arteriovenous malformation)    Clipped during Colonoscopy 05/2016   Barrett's esophagus    Bilateral cataracts    BPH (benign prostatic hyperplasia)    Cecal angiodysplasia 05/28/2016   ablated at colonoscopy   Deviated nasal septum    Diverticulosis of sigmoid colon    E. 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   Left Forehead (treatment after biopsy)   OSA on CPAP    Perianal rash    Recurrent epistaxis    Renal cyst 01/04/2013   Small left peripelvic renal cysts , noted on US  Renal   SCCA (squamous cell carcinoma) of skin 10/17/2015   Left Sup Bridge of Nose (curet, cautery and 5FU)   Seasonal allergies    Superficial basal cell carcinoma (BCC) 10/17/2015   Left Bulb of Nose (curet, cautery and 5FU)   Tubular adenoma     SURGICAL HISTORY: Past Surgical History:  Procedure Laterality Date   BILIARY STENT PLACEMENT N/A 01/23/2022   Procedure: BILIARY  STENT PLACEMENT;  Surgeon: Wilhelmenia Aloha Raddle., MD;  Location: [facility];  Service: Gastroenterology;  Laterality: N/A;   BILIARY STENT PLACEMENT N/A 08/06/2022   Procedure: BILIARY STENT PLACEMENT;  Surgeon: Charlanne Groom, MD;  Location: [facility];  Service: Gastroenterology;  Laterality: N/A;   BIOPSY  01/23/2022   Procedure: BIOPSY;  Surgeon: Wilhelmenia Aloha Raddle., MD;  Location: [facility];  Service: Gastroenterology;;   BIOPSY  07/09/2022   Procedure: BIOPSY;  Surgeon: San Sandor GAILS, DO;  Location: [facility];  Service: Gastroenterology;;   BIOPSY  08/02/2022   Procedure: BIOPSY;  Surgeon: Legrand Victory LITTIE DOUGLAS, MD;  Location: [facility];  Service: Gastroenterology;;   COLONOSCOPY  multiple   CYSTOSCOPY     ENDOSCOPIC RETROGRADE CHOLANGIOPANCREATOGRAPHY (ERCP) WITH PROPOFOL  N/A 01/23/2022   Procedure: ENDOSCOPIC RETROGRADE CHOLANGIOPANCREATOGRAPHY (ERCP) WITH PROPOFOL ;  Surgeon: Wilhelmenia Aloha Raddle., MD;  Location: [facility];  Service: Gastroenterology;  Laterality: N/A;   ENTEROSCOPY N/A 08/02/2022   Procedure: ENTEROSCOPY;  Surgeon: Legrand Victory LITTIE DOUGLAS, MD;  Location: [facility];  Service: Gastroenterology;  Laterality: N/A;   ERCP N/A 08/06/2022   Procedure: ENDOSCOPIC RETROGRADE CHOLANGIOPANCREATOGRAPHY (ERCP);  Surgeon: Charlanne Groom, MD;  Location: [facility];  Service: Gastroenterology;  Laterality: N/A;   ESOPHAGOGASTRODUODENOSCOPY  multiple   ESOPHAGOGASTRODUODENOSCOPY N/A 01/23/2022   Procedure:  ESOPHAGOGASTRODUODENOSCOPY (EGD);  Surgeon: Wilhelmenia Aloha Raddle., MD;  Location: [facility];  Service: Gastroenterology;  Laterality: N/A;   ESOPHAGOGASTRODUODENOSCOPY N/A 07/09/2022   Procedure: ESOPHAGOGASTRODUODENOSCOPY (EGD);  Surgeon: San Sandor GAILS, DO;  Location: [facility];  Service: Gastroenterology;  Laterality: N/A;   ESOPHAGOGASTRODUODENOSCOPY N/A 08/21/2022   Procedure: ESOPHAGOGASTRODUODENOSCOPY (EGD);  Surgeon: Stacia Glendia BRAVO, MD;   Location: [facility];  Service: Gastroenterology;  Laterality: N/A;   EUS N/A 01/23/2022   Procedure: UPPER ENDOSCOPIC ULTRASOUND (EUS) RADIAL;  Surgeon: Wilhelmenia Aloha Raddle., MD;  Location: [facility];  Service: Gastroenterology;  Laterality: N/A;   EVALUATION UNDER ANESTHESIA WITH FISTULECTOMY N/A 03/02/2018   Procedure: ANORECTAL EXAM UNDER ANESTHESIA WITH REPAIR OF SUPERFICIAL PERIRECTAL FISTULA AND HEMORRHOIDECTOMY;  Surgeon: Sheldon Standing, MD;  Location: [facility];  Service: General;  Laterality: N/A;   EXPLORATORY LAPAROTOMY     HEMOSTASIS CLIP PLACEMENT  07/09/2022   Procedure: HEMOSTASIS CLIP PLACEMENT;  Surgeon: San Sandor GAILS, DO;  Location: [facility];  Service: Gastroenterology;;   HOT HEMOSTASIS N/A 08/02/2022   Procedure: HOT HEMOSTASIS (ARGON PLASMA COAGULATION/BICAP);  Surgeon: Legrand Victory LITTIE DOUGLAS, MD;  Location: [facility];  Service: Gastroenterology;  Laterality: N/A;   HOT HEMOSTASIS N/A 08/21/2022   Procedure: HOT HEMOSTASIS (ARGON PLASMA COAGULATION/BICAP);  Surgeon: Stacia Glendia BRAVO, MD;  Location[facility];  Service: Gastroenterology;  Laterality: N/A;   IR THORACENTESIS ASP PLEURAL SPACE W/IMG GUIDE  07/10/2022   REMOVAL OF STONES  01/23/2022   Procedure: REMOVAL OF STONES;  Surgeon: Wilhelmenia Aloha Raddle., MD;  Location: [facility];  Service: Gastroenterology;;   REMOVAL OF STONES  08/06/2022   Procedure: REMOVAL OF STONES;  Surgeon: Charlanne Groom, MD;  Location: [facility];  Service: Gastroenterology;;   ANNETT  01/23/2022   Procedure: ANNETT;  Surgeon: Mansouraty, Aloha Raddle., MD;  Location: [facility];  Service: Gastroenterology;;    I have reviewed the social history and family history with the patient and they are unchanged from previous note.  ALLERGIES:  is allergic to nsaids.  MEDICATIONS:  Current Outpatient Medications  Medication Sig Dispense Refill   acetaminophen  (TYLENOL ) 325 MG tablet Take 650 mg by mouth every 4  (four) hours as needed for fever or moderate pain (pain score 4-6).     cetirizine (ZYRTEC) 10 MG tablet Take 10 mg by mouth daily as needed for allergies.     Cholecalciferol  (VITAMIN D3) 5000 units CAPS Take 5,000 Units by mouth every morning.     dapagliflozin  propanediol (FARXIGA ) 5 MG TABS tablet Take 1 tablet (5 mg total) by mouth in the morning.     dicyclomine  (BENTYL ) 10 MG capsule Take 1 capsule (10 mg total) by mouth 2 (two) times daily for 14 days.     famotidine  (PEPCID ) 20 MG tablet Take 1 tablet (20 mg total) by mouth 2 (two) times daily as needed for heartburn or indigestion. 60 tablet 1   furosemide  (LASIX ) 20 MG tablet Take 20 mg by mouth every other day. Give with KCL     Glucosamine 500 MG CAPS Take 1 capsule by mouth every morning.     levothyroxine  (SYNTHROID ) 137 MCG tablet Take 1 tablet (137 mcg total) by mouth daily before breakfast.     nitroGLYCERIN  (NITROSTAT ) 0.4 MG SL tablet Place 1 tablet (0.4 mg total) under the tongue every 5 (five) minutes as needed for chest pain. 50 tablet 3   Nutritional Supplements (BOOST PLUS PO) Take 1 Can by mouth once. 1 can orally one time a day for  weight loss     nystatin  cream (MYCOSTATIN ) Apply 1 Application topically every 8 (eight) hours as needed (Yeast).     olopatadine  (PATANOL) 0.1 % ophthalmic solution Place 1 drop into both eyes daily as needed for allergies.     ondansetron  (ZOFRAN -ODT) 4 MG disintegrating tablet Take 1 tablet (4 mg total) by mouth every 8 (eight) hours as needed. 10 tablet 0   pantoprazole  (PROTONIX ) 40 MG tablet Take 1 tablet (40 mg total) by mouth 2 (two) times daily.     potassium chloride  (KLOR-CON ) 10 MEQ tablet Take 10 mEq by mouth every other day. Give with furosemide      sodium chloride  (OCEAN) 0.65 % SOLN nasal spray Place 1 spray into both nostrils every 12 (twelve) hours as needed for congestion.     sucralfate  (CARAFATE ) 1 g tablet Take 1 g by mouth 4 (four) times daily.     Zinc Oxide 10 % OINT  Apply 1 Application topically as needed.     No current facility-administered medications for this visit.    PHYSICAL EXAMINATION: ECOG PERFORMANCE STATUS: 2 - Symptomatic, <50% confined to bed  Vitals:   01/07/24 1402 01/07/24 1403  BP: (!) 140/59 (!) 119/53  Pulse: 65   Resp: 17   Temp: 98.1 F (36.7 C)   SpO2: 98%    Wt Readings from Last 3 Encounters:  01/07/24 202 lb 6.4 oz (91.8 kg)  11/26/23 203 lb (92.1 kg)  10/22/23 209 lb (94.8 kg)     GENERAL:alert, no distress and comfortable SKIN: skin color, texture, turgor are normal, no rashes or significant lesions EYES: normal, Conjunctiva are pink and non-injected, sclera clear NECK: supple, thyroid  normal size, non-tender, without nodularity LYMPH:  no palpable lymphadenopathy in the cervical, axillary  LUNGS: clear to auscultation and percussion with normal breathing effort HEART: regular rate & rhythm and no murmurs and no lower extremity edema ABDOMEN:abdomen soft, non-tender and normal bowel sounds Musculoskeletal:no cyanosis of digits and no clubbing  NEURO: alert & oriented x 3 with fluent speech, no focal motor/sensory deficits  Physical Exam    LABORATORY DATA:  I have reviewed the data as listed    Latest Ref Rng & Units 01/07/2024    1:06 PM 11/26/2023   10:31 AM 10/16/2023   12:35 PM  CBC  WBC 4.0 - 10.5 K/uL 6.0  4.9  5.8   Hemoglobin 13.0 - 17.0 g/dL 89.7  89.9  9.7   Hematocrit 39.0 - 52.0 % 31.3  30.0  29.5   Platelets 150 - 400 K/uL 145  120  193         Latest Ref Rng & Units 10/06/2023    2:02 AM 09/03/2023   12:33 PM 07/09/2023   12:00 AM  CMP  Glucose 70 - 99 mg/dL 865  85    BUN 8 - 23 mg/dL 23  29  26       Creatinine 0.61 - 1.24 mg/dL 8.44  8.49  1.5      Sodium 135 - 145 mmol/L 138  142  141      Potassium 3.5 - 5.1 mmol/L 3.6  3.7  3.5      Chloride 98 - 111 mmol/L 108  111  109      CO2 22 - 32 mmol/L 19  24  23       Calcium  8.9 - 10.3 mg/dL 8.5  8.7  8.2      Total Protein 6.5  - 8.1 g/dL 6.4  6.6    Total Bilirubin 0.0 - 1.2 mg/dL 0.8  0.4    Alkaline Phos 38 - 126 U/L 95  119    AST 15 - 41 U/L 33  38    ALT 0 - 44 U/L 27  42       This result is from an external source.      RADIOGRAPHIC STUDIES: I have personally reviewed the radiological images as listed and agreed with the findings in the report. No results found.    Orders Placed This Encounter  Procedures   CBC with Differential (Cancer Center Only)    Standing Status:   Future    Expected Date:   03/31/2024    Expiration Date:   03/31/2025   Total Protein, Urine dipstick    Standing Status:   Future    Expected Date:   03/31/2024    Expiration Date:   03/31/2025   All questions were answered. The patient knows to call the clinic with any problems, questions or concerns. No barriers to learning was detected. The total time spent in the appointment was 25 minutes, including review of chart and various tests results, discussions about plan of care and coordination of care plan     Onita Mattock, MD 01/07/2024

## 2024-01-07 NOTE — Assessment & Plan Note (Signed)
-  cTxN0M0, diagnosed in 12/2021, MMR normal  -I reviewed PET scan findings, which showed no evidence of distant metastasis  --I reviewed his molecular testing, MMR was normal, he is not a candidate for immunotherapy.  Foundation One showed no targetable mutations. -He completed RT on 04/09/2022 -Due to advanced age, I do not plan to offer him chemotherapy -CT scan in June 2024 showed no evidence of residual disease or new metastasis, although CT is not ideal to evaluate the residual disease in his case.

## 2024-01-08 LAB — CANCER ANTIGEN 19-9: CA 19-9: 16 U/mL (ref 0–35)

## 2024-02-02 ENCOUNTER — Other Ambulatory Visit: Payer: Self-pay

## 2024-02-17 NOTE — Assessment & Plan Note (Signed)
-  cTxN0M0, diagnosed in 12/2021, MMR normal  -I reviewed PET scan findings, which showed no evidence of distant metastasis  --I reviewed his molecular testing, MMR was normal, he is not a candidate for immunotherapy.  Foundation One showed no targetable mutations. -He completed RT on 04/09/2022 -Due to advanced age, I do not plan to offer him chemotherapy -CT scan in June 2024 showed no evidence of residual disease or new metastasis, although CT is not ideal to evaluate the residual disease in his case.

## 2024-02-17 NOTE — Assessment & Plan Note (Signed)
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  -He has required multiple blood transfusions -We discussed the benefit and side effect of bevacizumab  for AVM related GI bleeding, he started on 09/11/22, he received every 2 weeks for total of 4 cycles. -due to good response to beva and persistent anemia, will continue beva every 4-6 weeks as maintenance therapy

## 2024-02-18 ENCOUNTER — Inpatient Hospital Stay: Attending: Physician Assistant

## 2024-02-18 ENCOUNTER — Inpatient Hospital Stay

## 2024-02-18 ENCOUNTER — Inpatient Hospital Stay (HOSPITAL_BASED_OUTPATIENT_CLINIC_OR_DEPARTMENT_OTHER): Admitting: Hematology

## 2024-02-18 VITALS — BP 129/64 | HR 61 | Temp 97.9°F | Resp 17 | Ht 68.0 in | Wt 202.2 lb

## 2024-02-18 DIAGNOSIS — Z5112 Encounter for antineoplastic immunotherapy: Secondary | ICD-10-CM | POA: Diagnosis present

## 2024-02-18 DIAGNOSIS — D519 Vitamin B12 deficiency anemia, unspecified: Secondary | ICD-10-CM | POA: Insufficient documentation

## 2024-02-18 DIAGNOSIS — C241 Malignant neoplasm of ampulla of Vater: Secondary | ICD-10-CM

## 2024-02-18 DIAGNOSIS — K5521 Angiodysplasia of colon with hemorrhage: Secondary | ICD-10-CM

## 2024-02-18 DIAGNOSIS — D500 Iron deficiency anemia secondary to blood loss (chronic): Secondary | ICD-10-CM | POA: Diagnosis not present

## 2024-02-18 LAB — CBC WITH DIFFERENTIAL (CANCER CENTER ONLY)
Abs Immature Granulocytes: 0.04 K/uL (ref 0.00–0.07)
Basophils Absolute: 0 K/uL (ref 0.0–0.1)
Basophils Relative: 1 %
Eosinophils Absolute: 0.1 K/uL (ref 0.0–0.5)
Eosinophils Relative: 2 %
HCT: 31.2 % — ABNORMAL LOW (ref 39.0–52.0)
Hemoglobin: 10.3 g/dL — ABNORMAL LOW (ref 13.0–17.0)
Immature Granulocytes: 1 %
Lymphocytes Relative: 25 %
Lymphs Abs: 1.5 K/uL (ref 0.7–4.0)
MCH: 32.7 pg (ref 26.0–34.0)
MCHC: 33 g/dL (ref 30.0–36.0)
MCV: 99 fL (ref 80.0–100.0)
Monocytes Absolute: 0.6 K/uL (ref 0.1–1.0)
Monocytes Relative: 10 %
Neutro Abs: 3.7 K/uL (ref 1.7–7.7)
Neutrophils Relative %: 61 %
Platelet Count: 134 K/uL — ABNORMAL LOW (ref 150–400)
RBC: 3.15 MIL/uL — ABNORMAL LOW (ref 4.22–5.81)
RDW: 18.6 % — ABNORMAL HIGH (ref 11.5–15.5)
WBC Count: 6.1 K/uL (ref 4.0–10.5)
nRBC: 0 % (ref 0.0–0.2)

## 2024-02-18 LAB — TOTAL PROTEIN, URINE DIPSTICK: Protein, ur: 100 mg/dL — AB

## 2024-02-18 MED ORDER — CYANOCOBALAMIN 1000 MCG/ML IJ SOLN
1000.0000 ug | Freq: Once | INTRAMUSCULAR | Status: AC
  Administered 2024-02-18: 1000 ug via INTRAMUSCULAR
  Filled 2024-02-18: qty 1

## 2024-02-18 MED ORDER — SODIUM CHLORIDE 0.9 % IV SOLN
5.0000 mg/kg | Freq: Once | INTRAVENOUS | Status: AC
  Administered 2024-02-18: 400 mg via INTRAVENOUS
  Filled 2024-02-18: qty 16

## 2024-02-18 MED ORDER — SODIUM CHLORIDE 0.9 % IV SOLN: Freq: Once | INTRAVENOUS | Status: AC

## 2024-02-18 NOTE — Patient Instructions (Signed)
 [facility] - A DEPT OF [place]  Discharge Instructions: Thank you for choosing [facility] to provide your oncology and hematology care.   If you have a lab appointment with the Cancer Center, please go directly to the Cancer Center and check in at the registration area.   Wear comfortable clothing and clothing appropriate for easy access to any Portacath or PICC line.   We strive to give you quality time with your provider. You may need to reschedule your appointment if you arrive late (15 or more minutes).  Arriving late affects you and other patients whose appointments are after yours.  Also, if you miss three or more appointments without notifying the office, you may be dismissed from the clinic at the provider's discretion.      For prescription refill requests, have your pharmacy contact our office and allow 72 hours for refills to be completed.    Today you received the following chemotherapy and/or immunotherapy agents Bevacizumab       To help prevent nausea and vomiting after your treatment, we encourage you to take your nausea medication as directed.  BELOW ARE SYMPTOMS THAT SHOULD BE REPORTED IMMEDIATELY: *FEVER GREATER THAN 100.4 F (38 C) OR HIGHER *CHILLS OR SWEATING *NAUSEA AND VOMITING THAT IS NOT CONTROLLED WITH YOUR NAUSEA MEDICATION *UNUSUAL SHORTNESS OF BREATH *UNUSUAL BRUISING OR BLEEDING *URINARY PROBLEMS (pain or burning when urinating, or frequent urination) *BOWEL PROBLEMS (unusual diarrhea, constipation, pain near the anus) TENDERNESS IN MOUTH AND THROAT WITH OR WITHOUT PRESENCE OF ULCERS (sore throat, sores in mouth, or a toothache) UNUSUAL RASH, SWELLING OR PAIN  UNUSUAL VAGINAL DISCHARGE OR ITCHING   Items with * indicate a potential emergency and should be followed up as soon as possible or go to the Emergency Department if any problems should occur.  Please show the CHEMOTHERAPY ALERT CARD or IMMUNOTHERAPY  ALERT CARD at check-in to the Emergency Department and triage nurse.  Should you have questions after your visit or need to cancel or reschedule your appointment, please contact [facility] - A DEPT OF [facility]  Dept: [phone]  and follow the prompts.  Office hours are 8:00 a.m. to 4:30 p.m. Monday - Friday. Please note that voicemails left after 4:00 p.m. may not be returned until the following business day.  We are closed weekends and major holidays. You have access to a nurse at all times for urgent questions. Please call the main number to the clinic Dept: [phone] and follow the prompts.   For any non-urgent questions, you may also contact your provider using MyChart. We now offer e-Visits for anyone 64 and older to request care online for non-urgent symptoms. For details visit [email or web address].   Also download the MyChart app! Go to the app store, search MyChart, open the app, select [facility], and log in with your MyChart username and password.

## 2024-02-20 ENCOUNTER — Encounter: Payer: Self-pay | Admitting: Hematology

## 2024-02-20 NOTE — Progress Notes (Signed)
 " [facility]   [phone]) [phone] Fax:([phone]   Clinic Follow up Note   Patient Care Team: Charlanne Fredia CROME, MD as PCP - General (Internal Medicine) Ladona Heinz, MD as PCP - Cardiology (Cardiology) Sheldon Standing, MD as Consulting Physician (General Surgery) Avram Lupita BRAVO, MD as Consulting Physician (Gastroenterology) Jesus Oliphant, MD as Consulting Physician (Otolaryngology) Ladona Heinz, MD as Consulting Physician (Cardiology) Porter Andrez SAUNDERS, PA-C (Inactive) as Physician Assistant (Dermatology) Lanny Callander, MD as Consulting Physician (Oncology)  Date of Service:  02/18/2024  CHIEF COMPLAINT: f/u of anemia   CURRENT THERAPY:  Bevacizumab  every 6 weeks  Oncology History   Iron deficiency anemia due to chronic blood loss -He has had multiple hospital admission for GI bleeding, had repeated the EGD.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  -He has required multiple blood transfusions -We discussed the benefit and side effect of bevacizumab  for AVM related GI bleeding, he started on 09/11/22, he received every 2 weeks for total of 4 cycles. 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  Foundation One showed no targetable mutations. -He completed RT on 04/09/2022 -Due to advanced age, I do not plan to offer him chemotherapy -CT scan in June 2024 showed no evidence of residual disease or new metastasis, although CT is not ideal to evaluate the residual disease in his case.       Assessment & Plan Iron deficiency anemia due to chronic gastrointestinal blood loss Chronic iron deficiency anemia  secondary to gastrointestinal blood loss. Hemoglobin is stable at 10.3 g/dL. He denies gastrointestinal bleeding or melena. He maintains independent function with ECOG 1 performance status, ambulatory with a walker, and participates in daily activities. No acute changes. - Reviewed most recent hemoglobin (10.3 g/dL). - Assessed for gastrointestinal bleeding symptoms; he denied hematochezia or melena. - Confirmed stable functional status and ability to perform activities of daily living. - Scheduled next follow-up appointment.  Plan - Patient is clinically doing well, lab reviewed, hemoglobin 10.3, stable. - Will proceed bevacizumab  today and continue every 6 weeks.   SUMMARY OF ONCOLOGIC HISTORY: Oncology History  Cancer of ampulla of Vater (HCC)  12/06/2021 Imaging   CT ABDOMEN PELVIS W CONTRAST   IMPRESSION: 1. There is moderate intrahepatic bile duct dilatation with marked fusiform dilatation of the common bile duct. No CT visible common bile duct stones identified. Differential considerations include a obstructing distal common bile duct stone (not visible by CT), distal CBD stricture, or mass at the level of the ampullary. Consider further evaluation with contrast enhanced MRI/MRCP or ERCP. 2. Contour the liver appears nodular which may reflect underlying cirrhosis. 3. Age-indeterminate compression fracture involving L1 vertebral body with loss of 50% of the vertebral body height. No signs of retropulsion of fracture fragments into the canal. 4. Fat containing umbilical hernia. 5. 5 mm anterolisthesis of L4 on L5. 6.  Aortic Atherosclerosis (ICD10-I70.0).   12/16/2021 Imaging   MR ABDOMEN MRCP W WO CONTAST   IMPRESSION: 1. Moderate intrahepatic and extrahepatic bile duct dilation with smooth tapering to the level of the ampulla without filling defect or focal mass lesion identified. Findings may reflect  ampullary stricture or occult ampullary mass. Consider further  evaluation with ERCP. 2. Mild dilated main pancreatic duct at the head measuring up to 8 mm. 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 Recommend follow-up pre and post-contrast MRI/MRCP in 2 years. This recommendation follows ACR consensus guidelines: Management of Incidental Pancreatic Cysts: A White Paper of the ACR Incidental Findings Committee. J Am Coll Radiol 2017;14:911-923. 3.  Aortic Atherosclerosis (ICD10-I70.0).   01/23/2022 Procedure   DG ERCP   IMPRESSION: Dilatation of the extrahepatic bile duct with severe tapering or narrowing in the distal common bile duct. Placement of biliary stent.   These images were submitted for radiologic interpretation only. Please see the procedural report for the amount of contrast.    01/23/2022 Procedure   EUS/ERCP  ENDOSONOGRAPHIC FINDING: There was dilation in the common bile duct (12.3 mm -> 20.4 mm) and in the common hepatic duct (23.0 mm). A small amount of hyperechoic material consistent with sludge was visualized endosonographically in the common bile duct. Moderate hyperechoic material consistent with sludge was visualized endosonographically in the gallbladder with normal gallbladder wall thickness. Pancreatic parenchymal abnormalities were noted in the entire pancreas. These consisted of hyperechoic foci with shadowing and cysts. There was a 9.1 mm by 6.1 mm cyst in the neck of the pancreas. There was a 15.3 mm by 14.1 mm cyst in the tail of the pancreas. The pancreatic duct had a dilated endosonographic appearance, had a prominently branched endosonographic appearance and had hyperechoic walls in the pancreatic head (PD - 8.2 mm -> 4.3 mm), genu of the pancreas (3.5 mm), body of the pancreas (3.2 mm) and tail of the pancreas (2.8 mm). A hypoechoic irregular lesion was identified endosonographically at the ampulla. The lesion  measured 15 mm by 13 mm in maximal cross-sectional diameter. The lesion extended from the mucosa to the submucosa. The outer margins were irregular. This is noted where CBD and PD dilation is noted to occur. Endosonographic imaging in the visualized portion of the liver showed no mass.  No malignant-appearing lymph nodes were visualized in the celiac region (level 20), peripancreatic region and porta hepatis region. The celiac region was visualized.   01/23/2022 Pathology Results   SURGICAL PATHOLOGY  CASE: [id number]  PATIENT: Carl Woods  Surgical Pathology Report   FINAL MICROSCOPIC DIAGNOSIS:   A. STOMACH, RANDOM, BIOPSY:  - Gastric mucosa, no significant abnormality.  No inflammation,  intestinal metaplasia, dysplasia or malignancy.   B. AMPULLARY LESION, BIOPSY:  - Adenocarcinoma.  See comment.    01/31/2022 Initial Diagnosis   Cancer of ampulla of Vater (HCC)   02/07/2022 Imaging    IMPRESSION: Interval increase in size of lymph node in the anterior cardiophrenic space on the right.   Increase small right pleural effusion compared to the previous MRI.   Subtle nodular tissue along the margin of the right hepatic lobe in the upper abdomen posteriorly. Recommend additional workup when appropriate.   Calcified pleural plaques.   Evidence of chronic liver disease.   Aortic Atherosclerosis (ICD10-I70.0).   02/14/2022 Miscellaneous   Foundation One:  Biomarker Findings Microsatellite status-Cannot be determined Tumor Mutation Burden-Cannot be determined  Genomics Findings EPHB4 amplificatio SF3B1 K666T TP53 R273H       Discussed the use of AI scribe software for clinical note transcription with the patient, who gave verbal consent to proceed.  History of Present Illness Carl Woods is a 89 year old male with  malignant neoplasm of the ampulla of Vater and chronic gastrointestinal bleeding with iron deficiency anemia who presents for hematology/oncology  follow-up to monitor anemia and assess for ongoing gastrointestinal bleeding.  He reports no new symptoms related to his gastrointestinal bleeding. He denies hematochezia and melena. His most recent hemoglobin is stable at 10.3 g/dL, and he feels his energy level is unchanged.  He lives in an assisted living facility and ambulates with a walker. He is independent in activities of daily living and participates in facility activities without need for physical therapy.  He is satisfied with his current functional status and support. He has regular family visits and adequate transportation from the facility, and he reports no barriers to attending appointments.     All other systems were reviewed with the patient and are negative.  MEDICAL HISTORY:  Past Medical History:  Diagnosis Date   Anal fistula    Arthritis    Fingers and hands   Atrial fibrillation (HCC)    AVM (arteriovenous malformation)    Clipped during Colonoscopy 05/2016   Barrett's esophagus    Bilateral cataracts    BPH (benign prostatic hyperplasia)    Cecal angiodysplasia 05/28/2016   ablated at colonoscopy   Deviated nasal septum    Diverticulosis of sigmoid colon    E. 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   Left Forehead (treatment after biopsy)   OSA on CPAP    Perianal rash    Recurrent epistaxis    Renal cyst 01/04/2013   Small left peripelvic renal cysts , noted on US  Renal   SCCA (squamous cell carcinoma) of skin 10/17/2015   Left Sup Bridge of Nose (curet, cautery and 5FU)   Seasonal allergies    Superficial basal cell carcinoma (BCC) 10/17/2015   Left Bulb of Nose (curet, cautery and 5FU)   Tubular adenoma     SURGICAL HISTORY: Past Surgical History:  Procedure Laterality Date   BILIARY STENT PLACEMENT N/A 01/23/2022   Procedure: BILIARY  STENT PLACEMENT;  Surgeon: Wilhelmenia Aloha Raddle., MD;  Location: [facility];  Service: Gastroenterology;  Laterality: N/A;   BILIARY STENT PLACEMENT N/A 08/06/2022   Procedure: BILIARY STENT PLACEMENT;  Surgeon: Charlanne Groom, MD;  Location: [facility];  Service: Gastroenterology;  Laterality: N/A;   BIOPSY  01/23/2022   Procedure: BIOPSY;  Surgeon: Wilhelmenia Aloha Raddle., MD;  Location: [facility];  Service: Gastroenterology;;   BIOPSY  07/09/2022   Procedure: BIOPSY;  Surgeon: San Sandor GAILS, DO;  Location: [facility];  Service: Gastroenterology;;   BIOPSY  08/02/2022   Procedure: BIOPSY;  Surgeon: Legrand Victory LITTIE DOUGLAS, MD;  Location: [facility];  Service: Gastroenterology;;   COLONOSCOPY  multiple   CYSTOSCOPY     ENDOSCOPIC RETROGRADE CHOLANGIOPANCREATOGRAPHY (ERCP) WITH PROPOFOL  N/A 01/23/2022   Procedure: ENDOSCOPIC RETROGRADE CHOLANGIOPANCREATOGRAPHY (ERCP) WITH PROPOFOL ;  Surgeon: Wilhelmenia Aloha Raddle., MD;  Location: [facility];  Service: Gastroenterology;  Laterality: N/A;   ENTEROSCOPY N/A 08/02/2022   Procedure: ENTEROSCOPY;  Surgeon: Legrand Victory LITTIE DOUGLAS, MD;  Location: [facility];  Service: Gastroenterology;  Laterality: N/A;   ERCP N/A 08/06/2022   Procedure: ENDOSCOPIC RETROGRADE CHOLANGIOPANCREATOGRAPHY (ERCP);  Surgeon: Charlanne Groom, MD;  Location: [facility];  Service: Gastroenterology;  Laterality: N/A;   ESOPHAGOGASTRODUODENOSCOPY  multiple   ESOPHAGOGASTRODUODENOSCOPY N/A 01/23/2022   Procedure:  ESOPHAGOGASTRODUODENOSCOPY (EGD);  Surgeon: Wilhelmenia Aloha Raddle., MD;  Location: [facility];  Service: Gastroenterology;  Laterality: N/A;   ESOPHAGOGASTRODUODENOSCOPY N/A 07/09/2022   Procedure: ESOPHAGOGASTRODUODENOSCOPY (EGD);  Surgeon: San Sandor GAILS, DO;  Location: [facility];  Service: Gastroenterology;  Laterality: N/A;   ESOPHAGOGASTRODUODENOSCOPY N/A 08/21/2022   Procedure: ESOPHAGOGASTRODUODENOSCOPY (EGD);  Surgeon: Stacia Glendia BRAVO, MD;   Location: [facility];  Service: Gastroenterology;  Laterality: N/A;   EUS N/A 01/23/2022   Procedure: UPPER ENDOSCOPIC ULTRASOUND (EUS) RADIAL;  Surgeon: Wilhelmenia Aloha Raddle., MD;  Location: [facility];  Service: Gastroenterology;  Laterality: N/A;   EVALUATION UNDER ANESTHESIA WITH FISTULECTOMY N/A 03/02/2018   Procedure: ANORECTAL EXAM UNDER ANESTHESIA WITH REPAIR OF SUPERFICIAL PERIRECTAL FISTULA AND HEMORRHOIDECTOMY;  Surgeon: Sheldon Standing, MD;  Location: [facility];  Service: General;  Laterality: N/A;   EXPLORATORY LAPAROTOMY     HEMOSTASIS CLIP PLACEMENT  07/09/2022   Procedure: HEMOSTASIS CLIP PLACEMENT;  Surgeon: San Sandor GAILS, DO;  Location: [facility];  Service: Gastroenterology;;   HOT HEMOSTASIS N/A 08/02/2022   Procedure: HOT HEMOSTASIS (ARGON PLASMA COAGULATION/BICAP);  Surgeon: Legrand Victory LITTIE DOUGLAS, MD;  Location: [facility];  Service: Gastroenterology;  Laterality: N/A;   HOT HEMOSTASIS N/A 08/21/2022   Procedure: HOT HEMOSTASIS (ARGON PLASMA COAGULATION/BICAP);  Surgeon: Stacia Glendia BRAVO, MD;  Location: [facility];  Service: Gastroenterology;  Laterality: N/A;   IR THORACENTESIS RIGHT ASP PLEURAL SPACE W/IMG GUIDE  07/10/2022   REMOVAL OF STONES  01/23/2022   Procedure: REMOVAL OF STONES;  Surgeon: Wilhelmenia Aloha Raddle., MD;  Location: [facility];  Service: Gastroenterology;;   REMOVAL OF STONES  08/06/2022   Procedure: REMOVAL OF STONES;  Surgeon: Charlanne Groom, MD;  Location: [facility];  Service: Gastroenterology;;   ANNETT  01/23/2022   Procedure: ANNETT;  Surgeon: Mansouraty, Aloha Raddle., MD;  Location: [facility];  Service: Gastroenterology;;    I have reviewed the social history and family history with the patient and they are unchanged from previous note.  ALLERGIES:  is allergic to nsaids.  MEDICATIONS:  Current Outpatient Medications  Medication Sig Dispense Refill   acetaminophen  (TYLENOL ) 325 MG tablet Take 650 mg by mouth  every 4 (four) hours as needed for fever or moderate pain (pain score 4-6).     cetirizine (ZYRTEC) 10 MG tablet Take 10 mg by mouth daily as needed for allergies.     Cholecalciferol  (VITAMIN D3) 5000 units CAPS Take 5,000 Units by mouth every morning.     dapagliflozin  propanediol (FARXIGA ) 5 MG TABS tablet Take 1 tablet (5 mg total) by mouth in the morning.     dicyclomine  (BENTYL ) 10 MG capsule Take 1 capsule (10 mg total) by mouth 2 (two) times daily for 14 days.     famotidine  (PEPCID ) 20 MG tablet Take 1 tablet (20 mg total) by mouth 2 (two) times daily as needed for heartburn or indigestion. 60 tablet 1   furosemide  (LASIX ) 20 MG tablet Take 20 mg by mouth every other day. Give with KCL     Glucosamine 500 MG CAPS Take 1 capsule by mouth every morning.     levothyroxine  (SYNTHROID ) 137 MCG tablet Take 1 tablet (137 mcg total) by mouth daily before breakfast.     nitroGLYCERIN  (NITROSTAT ) 0.4 MG SL tablet Place 1 tablet (0.4 mg total) under the tongue every 5 (five) minutes as needed for chest pain. 50 tablet 3   Nutritional Supplements (BOOST PLUS PO) Take 1 Can by mouth once. 1 can orally one time a day  for weight loss     nystatin  cream (MYCOSTATIN ) Apply 1 Application topically every 8 (eight) hours as needed (Yeast).     olopatadine  (PATANOL) 0.1 % ophthalmic solution Place 1 drop into both eyes daily as needed for allergies.     ondansetron  (ZOFRAN -ODT) 4 MG disintegrating tablet Take 1 tablet (4 mg total) by mouth every 8 (eight) hours as needed. 10 tablet 0   pantoprazole  (PROTONIX ) 40 MG tablet Take 1 tablet (40 mg total) by mouth 2 (two) times daily.     potassium chloride  (KLOR-CON ) 10 MEQ tablet Take 10 mEq by mouth every other day. Give with furosemide      sodium chloride  (OCEAN) 0.65 % SOLN nasal spray Place 1 spray into both nostrils every 12 (twelve) hours as needed for congestion.     sucralfate  (CARAFATE ) 1 g tablet Take 1 g by mouth 4 (four) times daily.     Zinc Oxide 10  % OINT Apply 1 Application topically as needed.     No current facility-administered medications for this visit.    PHYSICAL EXAMINATION: ECOG PERFORMANCE STATUS: 1 - Symptomatic but completely ambulatory  Vitals:   02/18/24 1100  BP: 129/64  Pulse: 61  Resp: 17  Temp: 97.9 F (36.6 C)  SpO2: 99%   Wt Readings from Last 3 Encounters:  02/18/24 202 lb 3.2 oz (91.7 kg)  01/07/24 202 lb 6.4 oz (91.8 kg)  11/26/23 203 lb (92.1 kg)     GENERAL:alert, no distress and comfortable SKIN: skin color, texture, turgor are normal, no rashes or significant lesions EYES: normal, Conjunctiva are pink and non-injected, sclera clear NECK: supple, thyroid  normal size, non-tender, without nodularity LYMPH:  no palpable lymphadenopathy in the cervical, axillary  LUNGS: clear to auscultation and percussion with normal breathing effort HEART: regular rate & rhythm and no murmurs and no lower extremity edema ABDOMEN:abdomen soft, non-tender and normal bowel sounds Musculoskeletal:no cyanosis of digits and no clubbing  NEURO: alert & oriented x 3 with fluent speech, no focal motor/sensory deficits  Physical Exam    LABORATORY DATA:  I have reviewed the data as listed    Latest Ref Rng & Units 02/18/2024   10:52 AM 01/07/2024    1:06 PM 11/26/2023   10:31 AM  CBC  WBC 4.0 - 10.5 K/uL 6.1  6.0  4.9   Hemoglobin 13.0 - 17.0 g/dL 89.6  89.7  89.9   Hematocrit 39.0 - 52.0 % 31.2  31.3  30.0   Platelets 150 - 400 K/uL 134  145  120         Latest Ref Rng & Units 10/06/2023    2:02 AM 09/03/2023   12:33 PM 07/09/2023   12:00 AM  CMP  Glucose 70 - 99 mg/dL 865  85    BUN 8 - 23 mg/dL 23  29  26       Creatinine 0.61 - 1.24 mg/dL 8.44  8.49  1.5      Sodium 135 - 145 mmol/L 138  142  141      Potassium 3.5 - 5.1 mmol/L 3.6  3.7  3.5      Chloride 98 - 111 mmol/L 108  111  109      CO2 22 - 32 mmol/L 19  24  23       Calcium  8.9 - 10.3 mg/dL 8.5  8.7  8.2      Total Protein 6.5 - 8.1 g/dL 6.4   6.6    Total Bilirubin 0.0 -  1.2 mg/dL 0.8  0.4    Alkaline Phos 38 - 126 U/L 95  119    AST 15 - 41 U/L 33  38    ALT 0 - 44 U/L 27  42       This result is from an external source.      RADIOGRAPHIC STUDIES: I have personally reviewed the radiological images as listed and agreed with the findings in the report. No results found.    Orders Placed This Encounter  Procedures   CBC with Differential (Cancer Center Only)    Standing Status:   Future    Expected Date:   05/12/2024    Expiration Date:   05/12/2025   Total Protein, Urine dipstick    Standing Status:   Future    Expected Date:   05/12/2024    Expiration Date:   05/12/2025   CBC with Differential (Cancer Center Only)    Standing Status:   Future    Expected Date:   06/23/2024    Expiration Date:   06/23/2025   Total Protein, Urine dipstick    Standing Status:   Future    Expected Date:   06/23/2024    Expiration Date:   06/23/2025   All questions were answered. The patient knows to call the clinic with any problems, questions or concerns. No barriers to learning was detected. The total time spent in the appointment was 25 minutes, including review of chart and various tests results, discussions about plan of care and coordination of care plan     Onita Mattock, MD 02/18/2024    "

## 2024-02-25 ENCOUNTER — Non-Acute Institutional Stay: Payer: Self-pay | Admitting: Internal Medicine

## 2024-02-25 ENCOUNTER — Encounter: Payer: Self-pay | Admitting: Internal Medicine

## 2024-02-25 DIAGNOSIS — N1832 Chronic kidney disease, stage 3b: Secondary | ICD-10-CM

## 2024-02-25 DIAGNOSIS — I4821 Permanent atrial fibrillation: Secondary | ICD-10-CM | POA: Diagnosis not present

## 2024-02-25 DIAGNOSIS — C241 Malignant neoplasm of ampulla of Vater: Secondary | ICD-10-CM | POA: Diagnosis not present

## 2024-02-25 DIAGNOSIS — I10 Essential (primary) hypertension: Secondary | ICD-10-CM

## 2024-02-25 DIAGNOSIS — R0982 Postnasal drip: Secondary | ICD-10-CM

## 2024-02-25 DIAGNOSIS — E039 Hypothyroidism, unspecified: Secondary | ICD-10-CM | POA: Diagnosis not present

## 2024-02-25 DIAGNOSIS — G4733 Obstructive sleep apnea (adult) (pediatric): Secondary | ICD-10-CM

## 2024-02-25 DIAGNOSIS — Q2730 Arteriovenous malformation, site unspecified: Secondary | ICD-10-CM | POA: Diagnosis not present

## 2024-02-25 DIAGNOSIS — I5032 Chronic diastolic (congestive) heart failure: Secondary | ICD-10-CM | POA: Diagnosis not present

## 2024-02-25 NOTE — Progress Notes (Signed)
 " Location:  [facility] Room Number: AL31-A Place of Service:  ALF [phone]) Provider:  Charlanne Fredia CROME, MD  Patient Care Team: Charlanne Fredia CROME, MD as PCP - General (Internal Medicine) Ladona Heinz, MD as PCP - Cardiology (Cardiology) Sheldon Standing, MD as Consulting Physician (General Surgery) Avram Lupita BRAVO, MD as Consulting Physician (Gastroenterology) Jesus Oliphant, MD as Consulting Physician (Otolaryngology) Ladona Heinz, MD as Consulting Physician (Cardiology) Porter Andrez SAUNDERS, PA-C (Inactive) as Physician Assistant (Dermatology) Lanny Callander, MD as Consulting Physician (Oncology)  Extended Emergency Contact Information Primary Emergency Contact: Wolaver,[name] Address: [facility] 2x [facility]          [address]          [place], [place] [address] [place] Mobile Phone: [phone] Relation: Daughter Secondary Emergency Contact: TRISTAN MICKEY [place], [place] [address] [place] Home Phone: [phone] Mobile Phone: [phone] Relation: Son  Code Status:  DNR Goals of care: Advanced Directive information    02/25/2024   11:27 AM  Advanced Directives  Does Patient Have a Medical Advance Directive? Yes  Type of Estate Agent of [place];Living will;Out of facility DNR (pink MOST or yellow form)  Does patient want to make changes to medical advance directive? No - Patient declined  Copy of Healthcare Power of Attorney in Chart? Yes - validated most recent copy scanned in chart (See row information)  Pre-existing out of facility DNR order (yellow form or pink MOST form) Yellow form placed in chart (order not valid for inpatient use)     Chief Complaint  Patient presents with   Medical Management of Chronic Issues    Routine Visit, needs to tetanus and flu vaccine    HPI:  Pt is a 89 y.o. male seen today for medical management of chronic diseases.    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  He said it has been going on for a long time.  He has to cough up to clear his throat.  It is thick mucus.  He has tried Flonase before but then had bleeding and was told to stop Otherwise he continues to do well Walks with his walker no falls His wife is now in skilled care  Past Medical History:  Diagnosis Date   Anal fistula    Arthritis    Fingers and hands   Atrial fibrillation (HCC)    AVM (arteriovenous malformation)    Clipped during Colonoscopy 05/2016   Barrett's esophagus    Bilateral cataracts    BPH (benign prostatic hyperplasia)    Cecal angiodysplasia 05/28/2016   ablated at colonoscopy   Deviated nasal septum    Diverticulosis of sigmoid colon    E. coli infection    Fatty liver    GERD (gastroesophageal reflux disease)    History of colon polyps    Hypothyroidism    Iron deficiency anemia  Nodular basal cell carcinoma (BCC) 11/05/2017   Left Forehead (treatment after biopsy)   OSA on CPAP    Perianal rash    Recurrent epistaxis    Renal cyst 01/04/2013   Small left peripelvic renal cysts , noted on US  Renal   SCCA (squamous cell carcinoma) of skin 10/17/2015   Left Sup Bridge of Nose (curet, cautery and 5FU)   Seasonal allergies    Superficial basal cell carcinoma (BCC) 10/17/2015   Left Bulb of Nose (curet, cautery and 5FU)   Tubular adenoma    Past Surgical History:  Procedure Laterality Date   BILIARY STENT PLACEMENT N/A 01/23/2022   Procedure: BILIARY STENT PLACEMENT;  Surgeon: Wilhelmenia Aloha Raddle., MD;  Location: [facility];  Service: Gastroenterology;  Laterality: N/A;   BILIARY STENT PLACEMENT N/A 08/06/2022   Procedure: BILIARY STENT PLACEMENT;  Surgeon: Charlanne Groom, MD;  Location: [facility];  Service: Gastroenterology;  Laterality: N/A;   BIOPSY  01/23/2022   Procedure: BIOPSY;  Surgeon: Wilhelmenia Aloha Raddle., MD;  Location: [facility];  Service: Gastroenterology;;   BIOPSY  07/09/2022   Procedure: BIOPSY;  Surgeon: San Sandor GAILS, DO;  Location: [facility];  Service: Gastroenterology;;   BIOPSY  08/02/2022   Procedure: BIOPSY;  Surgeon: Legrand Victory LITTIE DOUGLAS, MD;  Location: [facility];  Service: Gastroenterology;;   COLONOSCOPY  multiple   CYSTOSCOPY     ENDOSCOPIC RETROGRADE CHOLANGIOPANCREATOGRAPHY (ERCP) WITH PROPOFOL  N/A 01/23/2022   Procedure: ENDOSCOPIC RETROGRADE CHOLANGIOPANCREATOGRAPHY (ERCP) WITH PROPOFOL ;  Surgeon: Wilhelmenia Aloha Raddle., MD;  Location: [facility];  Service: Gastroenterology;  Laterality: N/A;   ENTEROSCOPY N/A 08/02/2022   Procedure: ENTEROSCOPY;  Surgeon: Legrand Victory LITTIE DOUGLAS, MD;  Location: [facility];  Service: Gastroenterology;  Laterality: N/A;   ERCP N/A 08/06/2022   Procedure: ENDOSCOPIC RETROGRADE CHOLANGIOPANCREATOGRAPHY (ERCP);  Surgeon: Charlanne Groom, MD;  Location: [facility];  Service: Gastroenterology;  Laterality: N/A;   ESOPHAGOGASTRODUODENOSCOPY  multiple   ESOPHAGOGASTRODUODENOSCOPY N/A 01/23/2022   Procedure: ESOPHAGOGASTRODUODENOSCOPY (EGD);  Surgeon: Wilhelmenia Aloha Raddle., MD;  Location: [facility];  Service: Gastroenterology;  Laterality: N/A;   ESOPHAGOGASTRODUODENOSCOPY N/A 07/09/2022   Procedure: ESOPHAGOGASTRODUODENOSCOPY (EGD);  Surgeon: San Sandor GAILS, DO;  Location: [facility];  Service: Gastroenterology;  Laterality: N/A;   ESOPHAGOGASTRODUODENOSCOPY N/A 08/21/2022   Procedure: ESOPHAGOGASTRODUODENOSCOPY (EGD);  Surgeon: Stacia Glendia BRAVO, MD;  Location: [facility];  Service: Gastroenterology;   Laterality: N/A;   EUS N/A 01/23/2022   Procedure: UPPER ENDOSCOPIC ULTRASOUND (EUS) RADIAL;  Surgeon: Wilhelmenia Aloha Raddle., MD;  Location: [facility];  Service: Gastroenterology;  Laterality: N/A;   EVALUATION UNDER ANESTHESIA WITH FISTULECTOMY N/A 03/02/2018   Procedure: ANORECTAL EXAM UNDER ANESTHESIA WITH REPAIR OF SUPERFICIAL PERIRECTAL FISTULA AND HEMORRHOIDECTOMY;  Surgeon: Sheldon Standing, MD;  Location: [facility];  Service: General;  Laterality: N/A;   EXPLORATORY LAPAROTOMY     HEMOSTASIS CLIP PLACEMENT  07/09/2022   Procedure: HEMOSTASIS CLIP PLACEMENT;  Surgeon: San Sandor GAILS, DO;  Location: [facility];  Service: Gastroenterology;;   HOT HEMOSTASIS N/A 08/02/2022   Procedure: HOT HEMOSTASIS (ARGON PLASMA COAGULATION/BICAP);  Surgeon: Legrand Victory LITTIE DOUGLAS, MD;  Location: [facility];  Service: Gastroenterology;  Laterality: N/A;   HOT HEMOSTASIS N/A 08/21/2022   Procedure: HOT HEMOSTASIS (ARGON PLASMA COAGULATION/BICAP);  Surgeon: Stacia Glendia BRAVO, MD;  Location: [facility];  Service: Gastroenterology;  Laterality: N/A;   IR THORACENTESIS RIGHT ASP PLEURAL SPACE W/IMG GUIDE  07/10/2022   REMOVAL OF STONES  01/23/2022   Procedure: REMOVAL OF STONES;  Surgeon: Wilhelmenia Aloha  Mickey., MD;  Location: [facility];  Service: Gastroenterology;;   REMOVAL OF STONES  08/06/2022   Procedure: REMOVAL OF STONES;  Surgeon: Charlanne Groom, MD;  Location: [facility];  Service: Gastroenterology;;   ANNETT  01/23/2022   Procedure: ANNETT;  Surgeon: Wilhelmenia Aloha Mickey., MD;  Location: [facility];  Service: Gastroenterology;;    Allergies[1]  Outpatient Encounter Medications as of 02/25/2024  Medication Sig   acetaminophen  (TYLENOL ) 325 MG tablet Take 650 mg by mouth every 4 (four) hours as needed for fever or moderate pain (pain score 4-6).   Cholecalciferol  (VITAMIN D3) 5000 units CAPS Take 5,000 Units by mouth every morning.   dapagliflozin  propanediol (FARXIGA ) 5 MG  TABS tablet Take 1 tablet (5 mg total) by mouth in the morning.   furosemide  (LASIX ) 20 MG tablet Take 20 mg by mouth every other day. Give with KCL   Glucosamine 500 MG CAPS Take 1 capsule by mouth every morning.   levothyroxine  (SYNTHROID ) 137 MCG tablet Take 1 tablet (137 mcg total) by mouth daily before breakfast.   nitroGLYCERIN  (NITROSTAT ) 0.4 MG SL tablet Place 1 tablet (0.4 mg total) under the tongue every 5 (five) minutes as needed for chest pain.   nystatin  cream (MYCOSTATIN ) Apply 1 Application topically every 8 (eight) hours as needed (Yeast).   pantoprazole  (PROTONIX ) 40 MG tablet Take 1 tablet (40 mg total) by mouth 2 (two) times daily.   potassium chloride  (KLOR-CON ) 10 MEQ tablet Take 10 mEq by mouth every other day. Give with furosemide    sucralfate  (CARAFATE ) 1 g tablet Take 1 g by mouth 4 (four) times daily.   Zinc Oxide 10 % OINT Apply 1 Application topically as needed.   [DISCONTINUED] cetirizine (ZYRTEC) 10 MG tablet Take 10 mg by mouth daily as needed for allergies.   [DISCONTINUED] dicyclomine  (BENTYL ) 10 MG capsule Take 1 capsule (10 mg total) by mouth 2 (two) times daily for 14 days.   [DISCONTINUED] famotidine  (PEPCID ) 20 MG tablet Take 1 tablet (20 mg total) by mouth 2 (two) times daily as needed for heartburn or indigestion.   [DISCONTINUED] Nutritional Supplements (BOOST PLUS PO) Take 1 Can by mouth once. 1 can orally one time a day for weight loss   [DISCONTINUED] olopatadine  (PATANOL) 0.1 % ophthalmic solution Place 1 drop into both eyes daily as needed for allergies.   [DISCONTINUED] ondansetron  (ZOFRAN -ODT) 4 MG disintegrating tablet Take 1 tablet (4 mg total) by mouth every 8 (eight) hours as needed.   [DISCONTINUED] sodium chloride  (OCEAN) 0.65 % SOLN nasal spray Place 1 spray into both nostrils every 12 (twelve) hours as needed for congestion.   No facility-administered encounter medications on file as of 02/25/2024.    Review of Systems  Constitutional:   Negative for activity change, appetite change and unexpected weight change.  HENT:  Positive for postnasal drip.   Respiratory:  Negative for cough and shortness of breath.   Cardiovascular:  Negative for leg swelling.  Gastrointestinal:  Negative for constipation.  Genitourinary:  Negative for frequency.  Musculoskeletal:  Positive for gait problem. Negative for arthralgias and myalgias.  Skin: Negative.  Negative for rash.  Neurological:  Negative for dizziness and weakness.  Psychiatric/Behavioral:  Negative for confusion and sleep disturbance.   All other systems reviewed and are negative.   Immunization History  Administered Date(s) Administered   DTaP 12/03/2005   Fluad Quad(high Dose 65+) 11/26/2022   INFLUENZA, HIGH DOSE SEASONAL PF 02/06/2014, 12/27/2015, 12/31/2021   Influenza, Quadrivalent, Recombinant, Inj, Pf 01/08/2017,  10/27/2017, 12/09/2018, 12/30/2019, 10/04/2020   Influenza-Unspecified 12/04/2010, 12/23/2011, 12/29/2012, 10/10/2022   Moderna Covid-19 Fall Seasonal Vaccine 25yrs & older 03/05/2019, 03/28/2019   Moderna Covid-19 Vaccine Bivalent Booster 60yrs & up 12/03/2022   Moderna Sars-Covid-2 Vaccination 01/12/2020   PFIZER Comirnaty(Gray Top)Covid-19 Tri-Sucrose Vaccine 03/05/2019, 03/28/2019   PPD Test 08/30/2022, 09/13/2022   Pfizer Covid-19 Vaccine Bivalent Booster 18yrs & up 01/12/2020   Pneumococcal Conjugate-13 02/06/2014   Pneumococcal Polysaccharide-23 12/04/2006, 02/10/2018, 06/22/2018   Unspecified SARS-COV-2 Vaccination 11/24/2023   Zoster Recombinant(Shingrix) 04/18/2021, 08/20/2021   Pertinent  Health Maintenance Due  Topic Date Due   Influenza Vaccine  08/28/2023      11/26/2022    1:45 PM 12/24/2022   12:20 PM 01/14/2023    3:16 PM 02/26/2023    8:59 AM 06/29/2023    3:20 PM  Fall Risk  Falls in the past year? 1 1 1  0 0  Was there an injury with Fall? 1  1  1    0   Fall Risk Category Calculator 2 2 2   0  Patient at Risk for Falls Due to  History of fall(s);Impaired balance/gait;Impaired mobility History of fall(s);Impaired balance/gait;Impaired mobility History of fall(s);Impaired balance/gait;Impaired mobility  History of fall(s)  Fall risk Follow up Falls evaluation completed;Education provided;Falls prevention discussed Falls evaluation completed;Education provided;Falls prevention discussed Falls evaluation completed;Education provided;Falls prevention discussed  Falls evaluation completed;Education provided     Data saved with a previous flowsheet row definition   Functional Status Survey:    Vitals:   02/25/24 1118  BP: (!) 152/81  Pulse: 66  Resp: 18  Temp: (!) 97.4 F (36.3 C)  SpO2: 97%  Weight: 201 lb 3.2 oz (91.3 kg)  Height: 5' 8 (1.727 m)   Body mass index is 30.59 kg/m. Physical Exam Vitals reviewed.  Constitutional:      Appearance: Normal appearance.  HENT:     Head: Normocephalic.     Nose: Nose normal. No congestion.     Mouth/Throat:     Mouth: Mucous membranes are moist.     Pharynx: Oropharynx is clear.  Eyes:     Pupils: Pupils are equal, round, and reactive to light.  Cardiovascular:     Rate and Rhythm: Normal rate. Rhythm irregular.     Pulses: Normal pulses.     Heart sounds: No murmur heard. Pulmonary:     Effort: Pulmonary effort is normal. No respiratory distress.     Breath sounds: Normal breath sounds. No rales.  Abdominal:     General: Abdomen is flat. Bowel sounds are normal.     Palpations: Abdomen is soft.  Musculoskeletal:        General: Swelling present.     Cervical back: Neck supple.  Skin:    General: Skin is warm.  Neurological:     General: No focal deficit present.     Mental Status: He is alert and oriented to person, place, and time.  Psychiatric:        Mood and Affect: Mood normal.        Thought Content: Thought content normal.     Labs reviewed: Recent Labs    06/11/23 1251 07/09/23 0000 09/03/23 1233 10/06/23 0202  NA 143 141 142 138   K 3.5 3.5 3.7 3.6  CL 111 109* 111 108  CO2 24 23* 24 19*  GLUCOSE 89  --  85 134*  BUN 26* 26* 29* 23  CREATININE 1.64* 1.5* 1.50* 1.55*  CALCIUM  8.6* 8.2* 8.7* 8.5*  Recent Labs    06/11/23 1251 09/03/23 1233 10/06/23 0202  AST 27 38 33  ALT 21 42 27  ALKPHOS 96 119 95  BILITOT 0.5 0.4 0.8  PROT 6.8 6.6 6.4*  ALBUMIN 3.5 3.4* 2.9*   Recent Labs    11/26/23 1031 01/07/24 1306 02/18/24 1052  WBC 4.9 6.0 6.1  NEUTROABS 2.9 3.4 3.7  HGB 10.0* 10.2* 10.3*  HCT 30.0* 31.3* 31.2*  MCV 99.3 100.6* 99.0  PLT 120* 145* 134*   Lab Results  Component Value Date   TSH 0.14 (A) 01/19/2023   Lab Results  Component Value Date   HGBA1C 4.4 (L) 08/02/2022   No results found for: CHOL, HDL, LDLCALC, LDLDIRECT, TRIG, CHOLHDL  Significant Diagnostic Results in last 30 days:  No results found.  Assessment/Plan 1. Chronic heart failure with preserved ejection fraction (HFpEF) (HCC) (Primary) Last echo showed EF of 60% Continues on Farxiga  and Lasix  He does have mild lower leg edema will continue to watch for now  2. Hypothyroidism, unspecified type Will repeat TSH  3. Cancer of ampulla of Vater (HCC) Last CT scan did not show any recommends  4. AVM (arteriovenous malformation) Follows with hematology Bevacizumab  Q 6 weeks  5. Permanent atrial fibrillation (HCC) No DOAC due to bleeding risk  6. Essential hypertension Will continue to monitor  7. Stage 3b chronic kidney disease (HCC) Repeat labs  8. OSA (obstructive sleep apnea) Compliant with his CPAP  9. Post-nasal drip Will try Atrovent nasal spray for 2 weeks    Family/ staff Communication:   Labs/tests ordered:  CMP,TSH         [1]  Allergies Allergen Reactions   Nsaids Other (See Comments)    Patient is to not take these because of kidney issues   "

## 2024-03-31 ENCOUNTER — Inpatient Hospital Stay: Admitting: Hematology

## 2024-03-31 ENCOUNTER — Inpatient Hospital Stay: Attending: Physician Assistant

## 2024-03-31 ENCOUNTER — Inpatient Hospital Stay
